# Patient Record
Sex: Male | Born: 1960 | Race: Black or African American | Hispanic: No | Marital: Single | Smoking: Former smoker
Health system: Southern US, Community
[De-identification: ages and names within clinical notes are randomized; demographics above are authoritative.]

## PROBLEM LIST (undated history)

## (undated) DIAGNOSIS — I639 Cerebral infarction, unspecified: Secondary | ICD-10-CM

## (undated) DIAGNOSIS — J449 Chronic obstructive pulmonary disease, unspecified: Secondary | ICD-10-CM

## (undated) DIAGNOSIS — I509 Heart failure, unspecified: Secondary | ICD-10-CM

## (undated) DIAGNOSIS — K259 Gastric ulcer, unspecified as acute or chronic, without hemorrhage or perforation: Secondary | ICD-10-CM

## (undated) DIAGNOSIS — I10 Essential (primary) hypertension: Secondary | ICD-10-CM

## (undated) DIAGNOSIS — K7460 Unspecified cirrhosis of liver: Secondary | ICD-10-CM

## (undated) DIAGNOSIS — F1010 Alcohol abuse, uncomplicated: Secondary | ICD-10-CM

---

## 2000-12-20 ENCOUNTER — Emergency Department (HOSPITAL_COMMUNITY): Admission: EM | Admit: 2000-12-20 | Discharge: 2000-12-20 | Payer: Self-pay | Admitting: Emergency Medicine

## 2006-05-17 ENCOUNTER — Encounter: Admission: RE | Admit: 2006-05-17 | Discharge: 2006-05-17 | Payer: Self-pay | Admitting: Occupational Medicine

## 2010-10-15 ENCOUNTER — Emergency Department (HOSPITAL_COMMUNITY)
Admission: EM | Admit: 2010-10-15 | Discharge: 2010-10-15 | Disposition: A | Discharge status: Home / Self Care | Payer: Self-pay | Attending: Emergency Medicine | Admitting: Emergency Medicine

## 2010-10-15 DIAGNOSIS — M545 Low back pain, unspecified: Secondary | ICD-10-CM | POA: Insufficient documentation

## 2010-10-15 DIAGNOSIS — G8929 Other chronic pain: Secondary | ICD-10-CM | POA: Insufficient documentation

## 2014-09-21 ENCOUNTER — Ambulatory Visit: Payer: Self-pay | Admitting: Family Medicine

## 2017-10-18 ENCOUNTER — Emergency Department (HOSPITAL_COMMUNITY): Payer: Self-pay

## 2017-10-18 ENCOUNTER — Emergency Department (HOSPITAL_COMMUNITY)
Admission: EM | Admit: 2017-10-18 | Discharge: 2017-10-19 | Disposition: A | Discharge status: Home / Self Care | Payer: Self-pay | Attending: Emergency Medicine | Admitting: Emergency Medicine

## 2017-10-18 ENCOUNTER — Encounter (HOSPITAL_COMMUNITY): Payer: Self-pay | Admitting: Emergency Medicine

## 2017-10-18 ENCOUNTER — Other Ambulatory Visit: Payer: Self-pay

## 2017-10-18 DIAGNOSIS — I509 Heart failure, unspecified: Secondary | ICD-10-CM | POA: Insufficient documentation

## 2017-10-18 DIAGNOSIS — R51 Headache: Secondary | ICD-10-CM | POA: Insufficient documentation

## 2017-10-18 DIAGNOSIS — R519 Headache, unspecified: Secondary | ICD-10-CM

## 2017-10-18 DIAGNOSIS — I10 Essential (primary) hypertension: Secondary | ICD-10-CM | POA: Insufficient documentation

## 2017-10-18 DIAGNOSIS — I110 Hypertensive heart disease with heart failure: Secondary | ICD-10-CM | POA: Insufficient documentation

## 2017-10-18 DIAGNOSIS — F17210 Nicotine dependence, cigarettes, uncomplicated: Secondary | ICD-10-CM | POA: Insufficient documentation

## 2017-10-18 HISTORY — DX: Essential (primary) hypertension: I10

## 2017-10-18 HISTORY — DX: Heart failure, unspecified: I50.9

## 2017-10-18 LAB — BASIC METABOLIC PANEL
Anion gap: 9 (ref 5–15)
BUN: 8 mg/dL (ref 6–20)
CHLORIDE: 103 mmol/L (ref 101–111)
CO2: 25 mmol/L (ref 22–32)
CREATININE: 0.93 mg/dL (ref 0.61–1.24)
Calcium: 8.6 mg/dL — ABNORMAL LOW (ref 8.9–10.3)
Glucose, Bld: 121 mg/dL — ABNORMAL HIGH (ref 65–99)
POTASSIUM: 3.7 mmol/L (ref 3.5–5.1)
SODIUM: 137 mmol/L (ref 135–145)

## 2017-10-18 LAB — CBC
HCT: 39.4 % (ref 39.0–52.0)
Hemoglobin: 13.5 g/dL (ref 13.0–17.0)
MCH: 34.4 pg — ABNORMAL HIGH (ref 26.0–34.0)
MCHC: 34.3 g/dL (ref 30.0–36.0)
MCV: 100.3 fL — AB (ref 78.0–100.0)
PLATELETS: 103 10*3/uL — AB (ref 150–400)
RBC: 3.93 MIL/uL — ABNORMAL LOW (ref 4.22–5.81)
RDW: 14.5 % (ref 11.5–15.5)
WBC: 6.3 10*3/uL (ref 4.0–10.5)

## 2017-10-18 LAB — URINALYSIS, ROUTINE W REFLEX MICROSCOPIC
GLUCOSE, UA: NEGATIVE mg/dL
HGB URINE DIPSTICK: NEGATIVE
KETONES UR: NEGATIVE mg/dL
NITRITE: NEGATIVE
PH: 7 (ref 5.0–8.0)
Protein, ur: NEGATIVE mg/dL
SPECIFIC GRAVITY, URINE: 1.025 (ref 1.005–1.030)

## 2017-10-18 MED ORDER — ACETAMINOPHEN 500 MG PO TABS
1000.0000 mg | ORAL_TABLET | Freq: Once | ORAL | Status: AC
  Administered 2017-10-18: 1000 mg via ORAL
  Filled 2017-10-18: qty 2

## 2017-10-18 MED ORDER — HYDROCHLOROTHIAZIDE 25 MG PO TABS
25.0000 mg | ORAL_TABLET | Freq: Once | ORAL | Status: AC
  Administered 2017-10-18: 25 mg via ORAL
  Filled 2017-10-18: qty 1

## 2017-10-18 MED ORDER — IBUPROFEN 800 MG PO TABS
800.0000 mg | ORAL_TABLET | Freq: Once | ORAL | Status: AC
  Administered 2017-10-18: 800 mg via ORAL
  Filled 2017-10-18: qty 1

## 2017-10-18 MED ORDER — LISINOPRIL 10 MG PO TABS
10.0000 mg | ORAL_TABLET | Freq: Once | ORAL | Status: AC
  Administered 2017-10-18: 10 mg via ORAL
  Filled 2017-10-18: qty 1

## 2017-10-18 NOTE — ED Notes (Signed)
Pt AO x 4 on triage no neuro deficit noticed, pt states he has a hx of HTN not taking his medication now.

## 2017-10-18 NOTE — ED Provider Notes (Signed)
[facility] EMERGENCY DEPARTMENT Provider Note   CSN: [id number] Arrival date & time: 10/18/17  2058     History   Chief Complaint Chief Complaint  Patient presents with  . Headache    HPI Jacob Bass is a 57 y.o. male.  Patient presents to the ER for evaluation of headache.  Patient reports that he has been having more frequent headaches over the last 3 weeks.  He does normally have headaches commonly, but the frequency has changed.  He is experiencing mostly right-sided headaches.  These occur sporadically and intermittently.  He has not identified anything that causes the headaches.  They go away on their own without intervention.  He has been experiencing pain in his right leg from the hip down and feels like his legs are weak in general, but equally weak, no unilateral weakness.  He denies vision change, shortness of breath, palpitations, syncope, chest pain.      Past Medical History:  Diagnosis Date  . CHF (congestive heart failure) (HCC)   . Hypertension     There are no active problems to display for this patient.   History reviewed. No pertinent surgical history.     Home Medications    Prior to Admission medications   Medication Sig Start Date End Date Taking? Authorizing Provider  hydrochlorothiazide (HYDRODIURIL) 25 MG tablet Take 1 tablet (25 mg total) by mouth daily. 10/19/17   Gilda Crease, MD  lisinopril (PRINIVIL,ZESTRIL) 10 MG tablet Take 1 tablet (10 mg total) by mouth daily. 10/19/17   Gilda Crease, MD    Family History History reviewed. No pertinent family history.  Social History Social History   Tobacco Use  . Smoking status: Current Every Day Smoker    Packs/day: 2.00    Types: Cigarettes  . Smokeless tobacco: Never Used  Substance Use Topics  . Alcohol use: No    Frequency: Never  . Drug use: No     Allergies   Patient has no known allergies.   Review of Systems Review of Systems    Respiratory: Negative for shortness of breath.   Cardiovascular: Negative for chest pain.  Neurological: Positive for headaches. Negative for syncope.  All other systems reviewed and are negative.    Physical Exam Updated Vital Signs BP (!) 175/95   Pulse 62   Temp 98.6 F (37 C) (Oral)   Resp 16   Ht 5\' 3"  (1.6 m)   Wt 70.8 kg (156 lb)   SpO2 99%   BMI 27.63 kg/m   Physical Exam  Constitutional: He is oriented to person, place, and time. He appears well-developed and well-nourished. No distress.  HENT:  Head: Normocephalic and atraumatic.  Right Ear: Hearing normal.  Left Ear: Hearing normal.  Nose: Nose normal.  Mouth/Throat: Oropharynx is clear and moist and mucous membranes are normal.  Eyes: Conjunctivae and EOM are normal. Pupils are equal, round, and reactive to light.  Neck: Normal range of motion. Neck supple.  Cardiovascular: Regular rhythm, S1 normal and S2 normal. Exam reveals no gallop and no friction rub.  No murmur heard. Pulmonary/Chest: Effort normal and breath sounds normal. No respiratory distress. He exhibits no tenderness.  Abdominal: Soft. Normal appearance and bowel sounds are normal. There is no hepatosplenomegaly. There is no tenderness. There is no rebound, no guarding, no tenderness at McBurney's point and negative Murphy's sign. No hernia.  Musculoskeletal: Normal range of motion.  Neurological: He is alert and oriented to person, place, and  time. He has normal strength. No cranial nerve deficit or sensory deficit. Coordination normal. GCS eye subscore is 4. GCS verbal subscore is 5. GCS motor subscore is 6.  Extraocular muscle movement: normal No visual field cut Pupils: equal and reactive both direct and consensual response is normal No nystagmus present    Sensory function is intact to light touch, pinprick Proprioception intact  Grip strength 5/5 symmetric in upper extremities No pronator drift Normal finger to nose bilaterally  Lower  extremity strength 5/5 against gravity (although some painful inhibition raising the right leg at the hip) Normal heel to shin bilaterally    Skin: Skin is warm, dry and intact. No rash noted. No cyanosis.  Psychiatric: He has a normal mood and affect. His speech is normal and behavior is normal. Thought content normal.  Nursing note and vitals reviewed.    ED Treatments / Results  Labs (all labs ordered are listed, but only abnormal results are displayed) Labs Reviewed  BASIC METABOLIC PANEL - Abnormal; Notable for the following components:      Result Value   Glucose, Bld 121 (*)    Calcium 8.6 (*)    All other components within normal limits  CBC - Abnormal; Notable for the following components:   RBC 3.93 (*)    MCV 100.3 (*)    MCH 34.4 (*)    Platelets 103 (*)    All other components within normal limits  URINALYSIS, ROUTINE W REFLEX MICROSCOPIC - Abnormal; Notable for the following components:   Color, Urine AMBER (*)    Bilirubin Urine SMALL (*)    Leukocytes, UA MODERATE (*)    Bacteria, UA RARE (*)    Squamous Epithelial / LPF 0-5 (*)    All other components within normal limits    EKG  EKG Interpretation  Date/Time:  Friday October 18 2017 21:15:46 EST Ventricular Rate:  81 PR Interval:  104 QRS Duration: 86 QT Interval:  410 QTC Calculation: 476 R Axis:   26 Text Interpretation:  Sinus rhythm with short PR ST & T wave abnormality, consider inferolateral ischemia Prolonged QT Abnormal ECG No previous tracing Confirmed by Gilda CreasePollina, Christopher J [phone]([id number]) on 10/18/2017 11:14:41 PM       Radiology Ct Head Wo Contrast  Result Date: 10/18/2017 CLINICAL DATA:  Numbness to the lower extremities for 2 weeks. Headache. EXAM: CT HEAD WITHOUT CONTRAST TECHNIQUE: Contiguous axial images were obtained from the base of the skull through the vertex without intravenous contrast. COMPARISON:  None. FINDINGS: Brain: There is no evidence for acute hemorrhage, hydrocephalus,  mass lesion, or abnormal extra-axial fluid collection. No definite CT evidence for acute infarction. Diffuse loss of parenchymal volume is consistent with atrophy. Patchy low attenuation in the deep hemispheric and periventricular white matter is nonspecific, but likely reflects chronic microvascular ischemic demyelination. Vascular: Atherosclerotic calcification of the carotid siphons noted at the skull base. No dense MCA sign. Skull: No evidence for fracture. No worrisome lytic or sclerotic lesion. Sinuses/Orbits: The visualized paranasal sinuses and mastoid air cells are clear. Visualized portions of the globes and intraorbital fat are unremarkable. Other: None. IMPRESSION: 1. No acute intracranial abnormality. 2. Atrophy with chronic small vessel white matter ischemic disease. Electronically Signed   By: Kennith CenterEric  Mansell M.D.   On: 10/18/2017 21:43    Procedures Procedures (including critical care time)  Medications Ordered in ED Medications  ibuprofen (ADVIL,MOTRIN) tablet 800 mg (800 mg Oral Given 10/18/17 2342)  acetaminophen (TYLENOL) tablet 1,000 mg (1,000 mg  Oral Given 10/18/17 2342)  lisinopril (PRINIVIL,ZESTRIL) tablet 10 mg (10 mg Oral Given 10/18/17 2342)  hydrochlorothiazide (HYDRODIURIL) tablet 25 mg (25 mg Oral Given 10/18/17 2342)     Initial Impression / Assessment and Plan / ED Course  I have reviewed the triage vital signs and the nursing notes.  Pertinent labs & imaging results that were available during my care of the patient were reviewed by me and considered in my medical decision making (see chart for details).     Patient presents to the emergency department for evaluation of headache.  He reports that over the last several weeks he has been having intermittent headaches at a higher frequency rate in his baseline headaches.  For the last 2 days he has had increased headaches and also has been experiencing weakness in both of his legs with pain in the legs, primarily the  right leg.  He has some pain with movement of the right hip, but no radiculopathy.  There is no neurologic deficit noted on examination.  Patient's blood work is unremarkable.  He had a head CT that did not show any acute abnormality.  Patient is noted to be hypertensive here.  He has a history of hypertension, but reports that he has been off of his meds for nearly a year.  He has no idea what medications he has been on in the past.  Will initiate lisinopril and hydrochlorothiazide.  Patient will require primary care follow-up.  Final Clinical Impressions(s) / ED Diagnoses   Final diagnoses:  Bad headache  Essential hypertension    ED Discharge Orders        Ordered    lisinopril (PRINIVIL,ZESTRIL) 10 MG tablet  Daily     10/19/17 0041    hydrochlorothiazide (HYDRODIURIL) 25 MG tablet  Daily     10/19/17 0041       Gilda Crease, MD 10/19/17 [phone]

## 2017-10-18 NOTE — ED Triage Notes (Signed)
Pt c/o 9/10 HA and bilateral leg pain for the past 2 days getting worse today, no fever, chills, nausea or vomiting.

## 2017-10-18 NOTE — ED Notes (Signed)
Patient transported to CT 

## 2017-10-19 MED ORDER — HYDROCHLOROTHIAZIDE 25 MG PO TABS: 25.0000 mg | ORAL_TABLET | Freq: Every day | ORAL | 3 refills | Status: DC

## 2017-10-19 MED ORDER — LISINOPRIL 10 MG PO TABS: 10.0000 mg | ORAL_TABLET | Freq: Every day | ORAL | 3 refills | Status: DC

## 2018-10-09 ENCOUNTER — Emergency Department (HOSPITAL_COMMUNITY): Payer: Medicaid Other

## 2018-10-09 ENCOUNTER — Other Ambulatory Visit: Payer: Self-pay

## 2018-10-09 ENCOUNTER — Encounter (HOSPITAL_COMMUNITY): Payer: Self-pay | Admitting: Emergency Medicine

## 2018-10-09 ENCOUNTER — Inpatient Hospital Stay (HOSPITAL_COMMUNITY)
Admission: EM | Admit: 2018-10-09 | Discharge: 2018-10-27 | DRG: 811 | Disposition: A | Discharge status: Skilled Nursing Facility | Payer: Medicaid Other | Attending: Internal Medicine | Admitting: Internal Medicine

## 2018-10-09 DIAGNOSIS — I110 Hypertensive heart disease with heart failure: Secondary | ICD-10-CM | POA: Diagnosis present

## 2018-10-09 DIAGNOSIS — G9341 Metabolic encephalopathy: Secondary | ICD-10-CM | POA: Diagnosis present

## 2018-10-09 DIAGNOSIS — K449 Diaphragmatic hernia without obstruction or gangrene: Secondary | ICD-10-CM | POA: Diagnosis present

## 2018-10-09 DIAGNOSIS — D539 Nutritional anemia, unspecified: Principal | ICD-10-CM | POA: Diagnosis present

## 2018-10-09 DIAGNOSIS — F1020 Alcohol dependence, uncomplicated: Secondary | ICD-10-CM | POA: Diagnosis present

## 2018-10-09 DIAGNOSIS — R030 Elevated blood-pressure reading, without diagnosis of hypertension: Secondary | ICD-10-CM | POA: Diagnosis present

## 2018-10-09 DIAGNOSIS — I619 Nontraumatic intracerebral hemorrhage, unspecified: Secondary | ICD-10-CM

## 2018-10-09 DIAGNOSIS — E876 Hypokalemia: Secondary | ICD-10-CM

## 2018-10-09 DIAGNOSIS — R079 Chest pain, unspecified: Secondary | ICD-10-CM | POA: Diagnosis present

## 2018-10-09 DIAGNOSIS — R9431 Abnormal electrocardiogram [ECG] [EKG]: Secondary | ICD-10-CM | POA: Diagnosis present

## 2018-10-09 DIAGNOSIS — R531 Weakness: Secondary | ICD-10-CM

## 2018-10-09 DIAGNOSIS — R29715 NIHSS score 15: Secondary | ICD-10-CM | POA: Diagnosis not present

## 2018-10-09 DIAGNOSIS — B1920 Unspecified viral hepatitis C without hepatic coma: Secondary | ICD-10-CM | POA: Diagnosis present

## 2018-10-09 DIAGNOSIS — D7589 Other specified diseases of blood and blood-forming organs: Secondary | ICD-10-CM | POA: Diagnosis present

## 2018-10-09 DIAGNOSIS — I6340 Cerebral infarction due to embolism of unspecified cerebral artery: Secondary | ICD-10-CM

## 2018-10-09 DIAGNOSIS — Z5900 Homelessness unspecified: Secondary | ICD-10-CM

## 2018-10-09 DIAGNOSIS — R29709 NIHSS score 9: Secondary | ICD-10-CM | POA: Diagnosis not present

## 2018-10-09 DIAGNOSIS — I248 Other forms of acute ischemic heart disease: Secondary | ICD-10-CM | POA: Diagnosis present

## 2018-10-09 DIAGNOSIS — K7030 Alcoholic cirrhosis of liver without ascites: Secondary | ICD-10-CM | POA: Diagnosis present

## 2018-10-09 DIAGNOSIS — I739 Peripheral vascular disease, unspecified: Secondary | ICD-10-CM | POA: Diagnosis present

## 2018-10-09 DIAGNOSIS — G8194 Hemiplegia, unspecified affecting left nondominant side: Secondary | ICD-10-CM | POA: Diagnosis not present

## 2018-10-09 DIAGNOSIS — K7460 Unspecified cirrhosis of liver: Secondary | ICD-10-CM

## 2018-10-09 DIAGNOSIS — D6959 Other secondary thrombocytopenia: Secondary | ICD-10-CM | POA: Diagnosis present

## 2018-10-09 DIAGNOSIS — R29713 NIHSS score 13: Secondary | ICD-10-CM | POA: Diagnosis not present

## 2018-10-09 DIAGNOSIS — K254 Chronic or unspecified gastric ulcer with hemorrhage: Secondary | ICD-10-CM | POA: Diagnosis present

## 2018-10-09 DIAGNOSIS — Z79899 Other long term (current) drug therapy: Secondary | ICD-10-CM

## 2018-10-09 DIAGNOSIS — W010XXA Fall on same level from slipping, tripping and stumbling without subsequent striking against object, initial encounter: Secondary | ICD-10-CM | POA: Diagnosis present

## 2018-10-09 DIAGNOSIS — I471 Supraventricular tachycardia: Secondary | ICD-10-CM | POA: Diagnosis not present

## 2018-10-09 DIAGNOSIS — D689 Coagulation defect, unspecified: Secondary | ICD-10-CM | POA: Diagnosis present

## 2018-10-09 DIAGNOSIS — I509 Heart failure, unspecified: Secondary | ICD-10-CM | POA: Diagnosis present

## 2018-10-09 DIAGNOSIS — R778 Other specified abnormalities of plasma proteins: Secondary | ICD-10-CM

## 2018-10-09 DIAGNOSIS — J690 Pneumonitis due to inhalation of food and vomit: Secondary | ICD-10-CM

## 2018-10-09 DIAGNOSIS — E162 Hypoglycemia, unspecified: Secondary | ICD-10-CM | POA: Diagnosis present

## 2018-10-09 DIAGNOSIS — R05 Cough: Secondary | ICD-10-CM

## 2018-10-09 DIAGNOSIS — K259 Gastric ulcer, unspecified as acute or chronic, without hemorrhage or perforation: Secondary | ICD-10-CM

## 2018-10-09 DIAGNOSIS — R195 Other fecal abnormalities: Secondary | ICD-10-CM

## 2018-10-09 DIAGNOSIS — R7989 Other specified abnormal findings of blood chemistry: Secondary | ICD-10-CM

## 2018-10-09 DIAGNOSIS — J449 Chronic obstructive pulmonary disease, unspecified: Secondary | ICD-10-CM

## 2018-10-09 DIAGNOSIS — R29810 Facial weakness: Secondary | ICD-10-CM | POA: Diagnosis present

## 2018-10-09 DIAGNOSIS — I639 Cerebral infarction, unspecified: Secondary | ICD-10-CM | POA: Diagnosis not present

## 2018-10-09 DIAGNOSIS — R4781 Slurred speech: Secondary | ICD-10-CM | POA: Diagnosis not present

## 2018-10-09 DIAGNOSIS — I6330 Cerebral infarction due to thrombosis of unspecified cerebral artery: Secondary | ICD-10-CM

## 2018-10-09 DIAGNOSIS — F17210 Nicotine dependence, cigarettes, uncomplicated: Secondary | ICD-10-CM | POA: Diagnosis present

## 2018-10-09 DIAGNOSIS — Y9289 Other specified places as the place of occurrence of the external cause: Secondary | ICD-10-CM

## 2018-10-09 DIAGNOSIS — R29714 NIHSS score 14: Secondary | ICD-10-CM | POA: Diagnosis not present

## 2018-10-09 DIAGNOSIS — K8070 Calculus of gallbladder and bile duct without cholecystitis without obstruction: Secondary | ICD-10-CM | POA: Diagnosis present

## 2018-10-09 DIAGNOSIS — R32 Unspecified urinary incontinence: Secondary | ICD-10-CM | POA: Diagnosis present

## 2018-10-09 DIAGNOSIS — E861 Hypovolemia: Secondary | ICD-10-CM | POA: Diagnosis present

## 2018-10-09 DIAGNOSIS — D509 Iron deficiency anemia, unspecified: Secondary | ICD-10-CM | POA: Diagnosis present

## 2018-10-09 DIAGNOSIS — I10 Essential (primary) hypertension: Secondary | ICD-10-CM | POA: Diagnosis present

## 2018-10-09 DIAGNOSIS — I959 Hypotension, unspecified: Secondary | ICD-10-CM | POA: Diagnosis not present

## 2018-10-09 DIAGNOSIS — D696 Thrombocytopenia, unspecified: Secondary | ICD-10-CM | POA: Diagnosis present

## 2018-10-09 DIAGNOSIS — R509 Fever, unspecified: Secondary | ICD-10-CM | POA: Diagnosis not present

## 2018-10-09 DIAGNOSIS — E538 Deficiency of other specified B group vitamins: Secondary | ICD-10-CM | POA: Diagnosis present

## 2018-10-09 DIAGNOSIS — E870 Hyperosmolality and hypernatremia: Secondary | ICD-10-CM | POA: Diagnosis present

## 2018-10-09 DIAGNOSIS — B370 Candidal stomatitis: Secondary | ICD-10-CM | POA: Diagnosis not present

## 2018-10-09 DIAGNOSIS — R0789 Other chest pain: Secondary | ICD-10-CM

## 2018-10-09 DIAGNOSIS — I609 Nontraumatic subarachnoid hemorrhage, unspecified: Secondary | ICD-10-CM

## 2018-10-09 DIAGNOSIS — R059 Cough, unspecified: Secondary | ICD-10-CM

## 2018-10-09 DIAGNOSIS — W19XXXA Unspecified fall, initial encounter: Secondary | ICD-10-CM

## 2018-10-09 DIAGNOSIS — R1310 Dysphagia, unspecified: Secondary | ICD-10-CM

## 2018-10-09 LAB — COMPREHENSIVE METABOLIC PANEL
ALT: 76 U/L — AB (ref 0–44)
AST: 133 U/L — AB (ref 15–41)
Albumin: 2.5 g/dL — ABNORMAL LOW (ref 3.5–5.0)
Alkaline Phosphatase: 76 U/L (ref 38–126)
Anion gap: 15 (ref 5–15)
BILIRUBIN TOTAL: 2 mg/dL — AB (ref 0.3–1.2)
BUN: 34 mg/dL — AB (ref 6–20)
CO2: 23 mmol/L (ref 22–32)
Calcium: 8.3 mg/dL — ABNORMAL LOW (ref 8.9–10.3)
Chloride: 101 mmol/L (ref 98–111)
Creatinine, Ser: 0.99 mg/dL (ref 0.61–1.24)
GFR calc Af Amer: >60 mL/min (ref >60)
GLUCOSE: 96 mg/dL (ref 70–99)
POTASSIUM: 4.3 mmol/L (ref 3.5–5.1)
Sodium: 139 mmol/L (ref 135–145)
TOTAL PROTEIN: 6.7 g/dL (ref 6.5–8.1)

## 2018-10-09 LAB — CBC WITH DIFFERENTIAL/PLATELET
ABS IMMATURE GRANULOCYTES: 0.06 10*3/uL (ref 0.00–0.07)
BASOS PCT: 0 %
Basophils Absolute: 0 10*3/uL (ref 0.0–0.1)
Eosinophils Absolute: 0 10*3/uL (ref 0.0–0.5)
Eosinophils Relative: 0 %
HEMATOCRIT: 31.1 % — AB (ref 39.0–52.0)
Hemoglobin: 10.3 g/dL — ABNORMAL LOW (ref 13.0–17.0)
IMMATURE GRANULOCYTES: 1 %
Lymphocytes Relative: 15 %
Lymphs Abs: 1.5 10*3/uL (ref 0.7–4.0)
MCH: 33.3 pg (ref 26.0–34.0)
MCHC: 33.1 g/dL (ref 30.0–36.0)
MCV: 100.6 fL — AB (ref 80.0–100.0)
MONO ABS: 1.5 10*3/uL — AB (ref 0.1–1.0)
MONOS PCT: 16 %
NEUTROS ABS: 6.6 10*3/uL (ref 1.7–7.7)
NEUTROS PCT: 68 %
PLATELETS: 100 10*3/uL — AB (ref 150–400)
RBC: 3.09 MIL/uL — ABNORMAL LOW (ref 4.22–5.81)
RDW: 13.2 % (ref 11.5–15.5)
WBC: 9.6 10*3/uL (ref 4.0–10.5)
nRBC: 0 % (ref 0.0–0.2)

## 2018-10-09 LAB — URINALYSIS, ROUTINE W REFLEX MICROSCOPIC
BACTERIA UA: NONE SEEN
Bilirubin Urine: NEGATIVE
Glucose, UA: NEGATIVE mg/dL
KETONES UR: 5 mg/dL — AB
LEUKOCYTES UA: NEGATIVE
Nitrite: NEGATIVE
PH: 5 (ref 5.0–8.0)
Protein, ur: NEGATIVE mg/dL
Specific Gravity, Urine: 1.024 (ref 1.005–1.030)

## 2018-10-09 LAB — RAPID URINE DRUG SCREEN, HOSP PERFORMED
AMPHETAMINES: NOT DETECTED
BARBITURATES: NOT DETECTED
BENZODIAZEPINES: NOT DETECTED
Cocaine: NOT DETECTED
Opiates: NOT DETECTED
Tetrahydrocannabinol: NOT DETECTED

## 2018-10-09 LAB — MAGNESIUM: MAGNESIUM: 1.9 mg/dL (ref 1.7–2.4)

## 2018-10-09 LAB — TROPONIN I
TROPONIN I: 0.04 ng/mL — AB (ref <0.03)
Troponin I: 0.04 ng/mL (ref <0.03)
Troponin I: 0.05 ng/mL (ref <0.03)

## 2018-10-09 LAB — LIPASE, BLOOD: Lipase: 30 U/L (ref 11–51)

## 2018-10-09 MED ORDER — SODIUM CHLORIDE 0.9 % IV BOLUS
1000.0000 mL | Freq: Once | INTRAVENOUS | Status: AC
  Administered 2018-10-09: 1000 mL via INTRAVENOUS

## 2018-10-09 MED ORDER — HEPARIN (PORCINE) 25000 UT/250ML-% IV SOLN
1050.0000 [IU]/h | INTRAVENOUS | Status: DC
  Administered 2018-10-09: 900 [IU]/h via INTRAVENOUS
  Filled 2018-10-09: qty 250

## 2018-10-09 MED ORDER — ALUM & MAG HYDROXIDE-SIMETH 200-200-20 MG/5ML PO SUSP
30.0000 mL | Freq: Once | ORAL | Status: AC
  Administered 2018-10-10: 30 mL via ORAL
  Filled 2018-10-09: qty 30

## 2018-10-09 MED ORDER — METOPROLOL TARTRATE 25 MG PO TABS
25.0000 mg | ORAL_TABLET | Freq: Two times a day (BID) | ORAL | Status: DC
  Administered 2018-10-10: 25 mg via ORAL
  Filled 2018-10-09: qty 1

## 2018-10-09 MED ORDER — ONDANSETRON HCL 4 MG/2ML IJ SOLN: 4.0000 mg | Freq: Four times a day (QID) | INTRAMUSCULAR | Status: DC | PRN

## 2018-10-09 MED ORDER — HEPARIN BOLUS VIA INFUSION
4000.0000 [IU] | Freq: Once | INTRAVENOUS | Status: AC
  Administered 2018-10-09: 4000 [IU] via INTRAVENOUS
  Filled 2018-10-09: qty 4000

## 2018-10-09 MED ORDER — ACETAMINOPHEN 325 MG PO TABS
650.0000 mg | ORAL_TABLET | ORAL | Status: DC | PRN
  Administered 2018-10-10 – 2018-10-26 (×4): 650 mg via ORAL
  Filled 2018-10-09 (×4): qty 2

## 2018-10-09 MED ORDER — MORPHINE SULFATE (PF) 2 MG/ML IV SOLN: 2.0000 mg | INTRAVENOUS | Status: DC | PRN

## 2018-10-09 MED ORDER — LISINOPRIL 10 MG PO TABS
10.0000 mg | ORAL_TABLET | Freq: Every day | ORAL | Status: DC
  Administered 2018-10-10 – 2018-10-15 (×4): 10 mg via ORAL
  Filled 2018-10-09 (×6): qty 1

## 2018-10-09 NOTE — Progress Notes (Signed)
ANTICOAGULATION CONSULT NOTE - Initial Consult  Pharmacy Consult for heparin Indication: chest pain/ACS  No Known Allergies  Patient Measurements: Height: 5\' 3"  (160 cm) Weight: 181 lb (82.1 kg) IBW/kg (Calculated) : 56.9 Heparin Dosing Weight: 74.4 kg  Vital Signs: Temp: 98.6 F (37 C) (02/06 2000) Temp Source: Oral (02/06 2000) BP: 134/69 (02/06 2000) Pulse Rate: 89 (02/06 2000)  Labs: Recent Labs    10/09/18 1315 10/09/18 1725 10/09/18 1947  HGB 10.3*  --   --   HCT 31.1*  --   --   PLT 100*  --   --   CREATININE 0.99  --   --   TROPONINI 0.04* 0.04* 0.05*    Estimated Creatinine Clearance: 76.1 mL/min (by C-G formula based on SCr of 0.99 mg/dL).   Medical History: Past Medical History:  Diagnosis Date  . CHF (congestive heart failure) (HCC)   . Hypertension     Assessment: 57 yom s/p mechanical fall, found to have abnormal EKG and elevated troponin (0.04>0.05). No AC PTA.  Hgb 10.3, plt 100. No s/sx of bleeding.   Goal of Therapy:  Heparin level 0.3-0.7 units/ml Monitor platelets by anticoagulation protocol: Yes   Plan:  Give 4000 units bolus x 1 Start heparin infusion at 900 units/hr Check anti-Xa level in 6 hours and daily while on heparin Continue to monitor H&H and platelets  Sherron Monday, PharmD, BCCCP Clinical Pharmacist  Pager: [phone] Phone: [phone] 10/09/2018,10:37 PM
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
## 2018-10-09 NOTE — ED Notes (Signed)
Attempted to call report; nurse unable to take at this time but will call me back. 

## 2018-10-09 NOTE — ED Notes (Signed)
Pt states that he gets the shakes when he does not drink-- drinks 1 case of beer a day
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
## 2018-10-09 NOTE — ED Provider Notes (Signed)
[facility] EMERGENCY DEPARTMENT Provider Note   CSN: [id number] Arrival date & time: 10/09/18  1243     History   Chief Complaint No chief complaint on file.   HPI Jacob Bass is a 58 y.o. male who presents for fall. Patient is homeless.  He has a history of chronic alcohol abuse.  Patient states that he does not get the shakes or seizures when he does not drink "as long as he gets the next drink."  Patient states he drinks about a case of beer daily.  He denies any other known medical problems however he states that he has not seen a real doctor since 1991.  EMR lists his chronic medical conditions as CHF and hypertension..  patient usually uses a wheelchair or cane to ambulate.  Today he states that his legs got weak and he fell.  Patient became incontinent of stool.  He has not had any nausea vomiting or diarrhea.  He denies any active chest pain however EMS was concerned about EKG that they felt had some ST segment depression.  Patient denies fevers chills history of DVT or PE.  He did not have syncope.  He denies hitting his head or losing consciousness.  Patient states that he was drinking this morning however he does not appear clinically intoxicated   HPI  Past Medical History:  Diagnosis Date  . CHF (congestive heart failure) (HCC)   . Hypertension     There are no active problems to display for this patient.   No past surgical history on file.      Home Medications    Prior to Admission medications   Medication Sig Start Date End Date Taking? Authorizing Provider  hydrochlorothiazide (HYDRODIURIL) 25 MG tablet Take 1 tablet (25 mg total) by mouth daily. 10/19/17   Gilda Crease, MD  lisinopril (PRINIVIL,ZESTRIL) 10 MG tablet Take 1 tablet (10 mg total) by mouth daily. 10/19/17   Gilda Crease, MD    Family History No family history on file.  Social History Social History   Tobacco Use  . Smoking status: Current Every Day  Smoker    Packs/day: 2.00    Types: Cigarettes  . Smokeless tobacco: Never Used  Substance Use Topics  . Alcohol use: No    Frequency: Never  . Drug use: No     Allergies   Patient has no known allergies.   Review of Systems Review of Systems Ten systems reviewed and are negative for acute change, except as noted in the HPI.    Physical Exam Updated Vital Signs BP (!) 152/67 (BP Location: Right Arm)   Pulse 95   Temp 98.6 F (37 C)   Resp 17   SpO2 100%   Physical Exam Vitals signs and nursing note reviewed.  Constitutional:      General: He is not in acute distress.    Appearance: He is well-developed. He is not diaphoretic.  HENT:     Head: Normocephalic and atraumatic.  Eyes:     General: No scleral icterus.    Conjunctiva/sclera: Conjunctivae normal.  Neck:     Musculoskeletal: Normal range of motion and neck supple.  Cardiovascular:     Rate and Rhythm: Normal rate and regular rhythm.     Heart sounds: Normal heart sounds.  Pulmonary:     Effort: Pulmonary effort is normal. No respiratory distress.     Breath sounds: Normal breath sounds.  Abdominal:     Palpations: Abdomen  is soft.     Tenderness: There is no abdominal tenderness.  Musculoskeletal:     Right lower leg: Edema present.     Left lower leg: Edema present.     Comments: Non pitting BL LE Edema No midline spinal tenderness or pelvic instability  Skin:    General: Skin is warm and dry.  Neurological:     Mental Status: He is alert.  Psychiatric:        Behavior: Behavior normal.      ED Treatments / Results  Labs (all labs ordered are listed, but only abnormal results are displayed) Labs Reviewed  COMPREHENSIVE METABOLIC PANEL  CBC WITH DIFFERENTIAL/PLATELET  TROPONIN I  LIPASE, BLOOD  URINALYSIS, ROUTINE W REFLEX MICROSCOPIC    EKG None  Radiology No results found.  Procedures Procedures (including critical care time)  Medications Ordered in ED Medications  sodium  chloride 0.9 % bolus 1,000 mL (has no administration in time range)     Initial Impression / Assessment and Plan / ED Course  I have reviewed the triage vital signs and the nursing notes.  Pertinent labs & imaging results that were available during my care of the patient were reviewed by me and considered in my medical decision making (see chart for details).  Clinical Course as of Oct 09 1450  Thu Oct 09, 2018  1355 MCV(!): 100.6 [AH]  1355 Hemoglobin(!): 10.3 [AH]  1355 Hgb urine dipstick(!): LARGE [AH]  1356 I personally reviewed the patient's EKG. No signs of acute ischemia.    [AH]    Clinical Course User Index [AH] Arthor Captain, PA-C    Patient is seen after fall, history of chronic alcohol abuse.  Lab work-up is pending.  His initial EKG shows no signs of acute ischemia.  I have given sign out to PA Laveda Norman who will assume care of the patient.  Final Clinical Impressions(s) / ED Diagnoses   Final diagnoses:  None    ED Discharge Orders    None       Arthor Captain, PA-C 10/15/18 1256    Charlynne Pander, MD 10/15/18 [phone]

## 2018-10-10 ENCOUNTER — Other Ambulatory Visit: Payer: Self-pay

## 2018-10-10 ENCOUNTER — Inpatient Hospital Stay (HOSPITAL_BASED_OUTPATIENT_CLINIC_OR_DEPARTMENT_OTHER): Payer: Medicaid Other

## 2018-10-10 DIAGNOSIS — Z720 Tobacco use: Secondary | ICD-10-CM

## 2018-10-10 DIAGNOSIS — R072 Precordial pain: Secondary | ICD-10-CM

## 2018-10-10 DIAGNOSIS — R030 Elevated blood-pressure reading, without diagnosis of hypertension: Secondary | ICD-10-CM

## 2018-10-10 DIAGNOSIS — W19XXXA Unspecified fall, initial encounter: Secondary | ICD-10-CM

## 2018-10-10 DIAGNOSIS — R7989 Other specified abnormal findings of blood chemistry: Secondary | ICD-10-CM

## 2018-10-10 DIAGNOSIS — I361 Nonrheumatic tricuspid (valve) insufficiency: Secondary | ICD-10-CM

## 2018-10-10 LAB — CBC
HCT: 23 % — ABNORMAL LOW (ref 39.0–52.0)
HCT: 24 % — ABNORMAL LOW (ref 39.0–52.0)
HCT: 28.4 % — ABNORMAL LOW (ref 39.0–52.0)
Hemoglobin: 7.8 g/dL — ABNORMAL LOW (ref 13.0–17.0)
Hemoglobin: 8 g/dL — ABNORMAL LOW (ref 13.0–17.0)
Hemoglobin: 9.7 g/dL — ABNORMAL LOW (ref 13.0–17.0)
MCH: 33.3 pg (ref 26.0–34.0)
MCH: 33.1 pg (ref 26.0–34.0)
MCH: 34.2 pg — ABNORMAL HIGH (ref 26.0–34.0)
MCHC: 33.9 g/dL (ref 30.0–36.0)
MCHC: 33.3 g/dL (ref 30.0–36.0)
MCHC: 34.2 g/dL (ref 30.0–36.0)
MCV: 98.3 fL (ref 80.0–100.0)
MCV: 99.2 fL (ref 80.0–100.0)
MCV: 100 fL (ref 80.0–100.0)
NRBC: 0 % (ref 0.0–0.2)
Platelets: 83 10*3/uL — ABNORMAL LOW (ref 150–400)
Platelets: 89 10*3/uL — ABNORMAL LOW (ref 150–400)
Platelets: 108 10*3/uL — ABNORMAL LOW (ref 150–400)
RBC: 2.34 MIL/uL — AB (ref 4.22–5.81)
RBC: 2.42 MIL/uL — ABNORMAL LOW (ref 4.22–5.81)
RBC: 2.84 MIL/uL — ABNORMAL LOW (ref 4.22–5.81)
RDW: 13.4 % (ref 11.5–15.5)
RDW: 13.3 % (ref 11.5–15.5)
RDW: 13.4 % (ref 11.5–15.5)
WBC: 7.5 10*3/uL (ref 4.0–10.5)
WBC: 7 10*3/uL (ref 4.0–10.5)
WBC: 7.6 10*3/uL (ref 4.0–10.5)
nRBC: 0 % (ref 0.0–0.2)
nRBC: 0 % (ref 0.0–0.2)

## 2018-10-10 LAB — TYPE AND SCREEN
ABO/RH(D): O POS
Antibody Screen: NEGATIVE

## 2018-10-10 LAB — HIV ANTIBODY (ROUTINE TESTING W REFLEX): HIV Screen 4th Generation wRfx: NONREACTIVE

## 2018-10-10 LAB — LIPID PANEL
Cholesterol: 107 mg/dL (ref 0–200)
HDL: 21 mg/dL — ABNORMAL LOW (ref >40)
LDL CALC: 72 mg/dL (ref 0–99)
Total CHOL/HDL Ratio: 5.1 RATIO
Triglycerides: 69 mg/dL (ref <150)
VLDL: 14 mg/dL (ref 0–40)

## 2018-10-10 LAB — HEPARIN LEVEL (UNFRACTIONATED): Heparin Unfractionated: 0.23 IU/mL — ABNORMAL LOW (ref 0.30–0.70)

## 2018-10-10 LAB — ECHOCARDIOGRAM COMPLETE
Height: 63 in
Weight: 2249.6 oz

## 2018-10-10 LAB — OCCULT BLOOD X 1 CARD TO LAB, STOOL: FECAL OCCULT BLD: POSITIVE — AB

## 2018-10-10 LAB — TROPONIN I
TROPONIN I: 0.04 ng/mL — AB (ref <0.03)
Troponin I: 0.05 ng/mL (ref <0.03)
Troponin I: 0.04 ng/mL (ref <0.03)

## 2018-10-10 LAB — ABO/RH: ABO/RH(D): O POS

## 2018-10-10 LAB — VITAMIN B12: Vitamin B-12: 313 pg/mL (ref 180–914)

## 2018-10-10 MED ORDER — METOPROLOL TARTRATE 25 MG PO TABS
25.0000 mg | ORAL_TABLET | Freq: Two times a day (BID) | ORAL | Status: DC
  Administered 2018-10-10 – 2018-10-12 (×5): 25 mg via ORAL
  Filled 2018-10-10 (×6): qty 1

## 2018-10-10 MED ORDER — SODIUM CHLORIDE 0.9 % IV SOLN: INTRAVENOUS | Status: DC

## 2018-10-10 NOTE — Progress Notes (Addendum)
Progress Note  Patient Name: Jacob Bass Date of Encounter: 10/10/2018  Primary Cardiologist: [facility] (Dr. Jacques NavyAcharya).  Subjective   No significant overnight events. Patient denies any chest pain or shortness of breath. He states he has only ever had one episode of chest pain several months ago while he was drinking beer but upon further question he states it was more abdominal pain not chest pain. He denies any blood in his urine or stools.   Inpatient Medications    Scheduled Meds: . lisinopril  10 mg Oral Daily  . metoprolol tartrate  25 mg Oral BID   Continuous Infusions: . heparin 1,050 Units/hr (10/10/18 [id number])   PRN Meds: acetaminophen, morphine injection, ondansetron (ZOFRAN) IV   Vital Signs    Vitals:   10/09/18 1600 10/09/18 2000 10/09/18 2340 10/10/18 0436  BP: 129/70 134/69 (!) 147/77 (!) 117/59  Pulse: 90 89 86 68  Resp: (!) 26 18 20 20   Temp:  98.6 F (37 C) 98.3 F (36.8 C) 99.6 F (37.6 C)  TempSrc:  Oral Oral Oral  SpO2: 97% 94% 100% 97%  Weight:   64.3 kg 63.8 kg  Height:        Intake/Output Summary (Last 24 hours) at 10/10/2018 0859 Last data filed at 10/10/2018 0448 Gross per 24 hour  Intake 68.02 ml  Output 200 ml  Net -131.98 ml   Last 3 Weights 10/10/2018 10/09/2018 10/09/2018  Weight (lbs) 140 lb 9.6 oz 141 lb 12.8 oz 181 lb  Weight (kg) 63.776 kg 64.32 kg 82.101 kg      Telemetry    Sinus rhythm with heart rates in the 60's to 80's. - Personally Reviewed  ECG    No new ECG tracing today. - Personally Reviewed  Physical Exam   GEN: African-American male resting comfortably in no acute distress.   Neck: Supple. No JVD. Cardiac: RRR. No murmurs, rubs, or gallops.  Respiratory: Diminished breath sounds bilaterally. Minimal crackles noted in bases.  GI: Abdomen soft, non-distended, and non-tender. Bowel sounds present.  MS: Trace to 1+ pitting edema of bilateral lower extremities (left > right). Skin: Warm and dry. Neuro:  No focal  deficits. Psych: Normal affect. Somewhat drowsy this morning.  Labs    Chemistry Recent Labs  Lab 10/09/18 1315  NA 139  K 4.3  CL 101  CO2 23  GLUCOSE 96  BUN 34*  CREATININE 0.99  CALCIUM 8.3*  PROT 6.7  ALBUMIN 2.5*  AST 133*  ALT 76*  ALKPHOS 76  BILITOT 2.0*  GFRNONAA >60  GFRAA >60  ANIONGAP 15     Hematology Recent Labs  Lab 10/09/18 1315 10/10/18 0313  WBC 9.6 7.5  RBC 3.09* 2.34*  HGB 10.3* 7.8*  HCT 31.1* 23.0*  MCV 100.6* 98.3  MCH 33.3 33.3  MCHC 33.1 33.9  RDW 13.2 13.4  PLT 100* 83*    Cardiac Enzymes Recent Labs  Lab 10/09/18 1947 10/09/18 2356 10/10/18 0313 10/10/18 0520  TROPONINI 0.05* 0.05* 0.04* 0.04*   No results for input(s): TROPIPOC in the last 168 hours.   BNPNo results for input(s): BNP, PROBNP in the last 168 hours.   DDimer No results for input(s): DDIMER in the last 168 hours.   Radiology    Dg Chest 2 View  Result Date: 10/09/2018 CLINICAL DATA:  Chest pain, fall today.  History of CHF. EXAM: CHEST - 2 VIEW COMPARISON:  Chest radiograph May 17, 2016 FINDINGS: Cardiac silhouette is upper limits of normal  size. Mild pulmonary vascular congestion without pleural effusion or focal consolidation. Biapical pleural thickening. No pneumothorax. Upper thoracic dextroscoliosis. IMPRESSION: Borderline cardiomegaly and mild pulmonary vascular congestion. Electronically Signed   By: Awilda Metro M.D.   On: 10/09/2018 15:33   Dg Hip Unilat W Or Wo Pelvis 2-3 Views Right  Result Date: 10/09/2018 CLINICAL DATA:  Larey Seat today.  Struck by Librarian, academic in 2007. EXAM: DG HIP (WITH OR WITHOUT PELVIS) 2-3V RIGHT COMPARISON:  None. FINDINGS: Femoral heads are located. No dislocation. Severe RIGHT hip joint space narrowing with subchondral cyst formation, chronic flattening of the humeral head. RIGHT hip heterotopic ossification. Mild LEFT hip joint space narrowing and marginal spurring. No destructive bony lesions. Sacroiliac joints are  symmetric. Mild vascular calcifications. IMPRESSION: 1. No acute fracture deformity or dislocation. 2. 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 3.  Aortic Atherosclerosis (ICD10-I70.0). Electronically Signed   By: Awilda Metro M.D.   On: 10/09/2018 17:44    Cardiac Studies   None.  Patient Profile   Jacob Bass is a 58 y.o. male with a history of hypertension and homelessness who presented to the [facility] ED after suffering a mechanical fall and was found to have an elevated troponin. Cardiology was consulted for the elevated troponin and concern for possible ACS.  Assessment & Plan    Elevated Troponin  - EKG in the ED showed sinus rhythm with LVH and some repolarization changes but no acute ischemic changes compared to prior tracings. - Troponin minimally elevated and flat at 0.04 >> 0.04 >> 0.05 >> 0.05 >> 0.04 >> 0.04. Not consistent with ACS. - LDL 72.  - Patient denies having any recent chest pain to me. He states he has only ever had one episode of chest pain several months ago when drinking beer and upon further questioning he states it was more abdominal pain. - Will check Echo given questionable history of CHF (Patient has CHF listed under his problem list but patient does not recall this and there is not any notes or documentation in the chart to support this). - Will discontinue Heparin at this time. - Likely demand ischemia given hemoglobin of 7.8. Given no cardiac symptoms and acute anemia, do not think there is much benefit in pursuing stress test at this time. Could consider as outpatient pending Echo results.   Acute Normocytic Anemia - Hemoglobin 10.3 on admission. Was 13.5 in 10/2017. - Repeat hemoglobin 7.8.  - Hemoccult has been ordered.  - Management per primary team.   Hypertension  - BP currently well controlled at 117/59.  - Continue current medications.  For questions or updates, please contact [facility] Please consult  [email or web address] for contact info under        Signed, Corrin Parker, PA-C  10/10/2018, 8:59 AM   ---------------------------------------------------------------------------------------------   History and all data above reviewed.  Patient examined.  I agree with the findings as above.  Jacob Bass is a 58 yo gentleman with a history of hypertension and homelessness who presents after a mechanical fall. He is noted to have an acute anemia and noncardiac chest pain. Borderline, flat troponin elevation.   Constitutional: No acute distress Cardiovascular: regular rhythm, normal rate, no murmurs. S1 and S2 normal. Radial pulses normal bilaterally. No jugular venous distention.  Respiratory: clear to auscultation bilaterally GI : normal bowel sounds, soft and nontender. No distention.   MSK: extremities warm, well perfused. No edema.  NEURO: grossly nonfocal exam, moves all extremities.  PSYCH: alert and oriented x 3, normal mood and affect.   All available labs, radiology testing, previous records reviewed. Agree with documented assessment and plan of my colleague as stated above with the following additions or changes:  Principal Problem:   Chest pain Active Problems:   Troponin level elevated   EKG abnormalities   Homelessness   Blood pressure elevated without history of HTN   Anemia, macrocytic   Thrombocytopenia (HCC)    Plan: chest soreness likely does not represent cardiac chest pain. Mild troponin elevation is likely demand related, and unlikely to represent ACS. Recommend evaluation for anemia. Correction of anemia will likely relieve demand mismatch. Echo significant for grade 2 diastolic dysfunction and moderate LA enlargement.   Patient will be referred for close outpatient cardiology follow up and consideration for further cardiovascular risk stratification can be performed as an outpatient.   Time Spent Directly with Patient:  I have spent a total of 25 minutes with  the patient reviewing hospital notes, telemetry, EKGs, labs and examining the patient as well as establishing an assessment and plan that was discussed personally with the patient.  > 50% of time was spent in direct patient care.  Length of Stay:  LOS: 0 days   Jacob PoissonGayatri A Acharya, MD [facility] 6:07 PM  10/10/2018

## 2018-10-10 NOTE — Progress Notes (Signed)
Physical Therapy Evaluation Patient Details Name: Jacob Bass MRN: [id number] DOB: Apr 28, 1961 Today's Date: 10/10/2018   History of Present Illness  Patient is 58 y/o male presenting to hospital following a fall. Patient with abnormal EKG and elevated troponins but likley due to demand ischemia. PMH includes HTN, anemia, questionable CHF, and thrombocytopenia.    Clinical Impression  Patient admitted to hospital secondary to problems above and with deficits below. Patient required min-guard to minA to stand x2, and required min guard to ambulate with use of RW. Patient with noticeable R knee swelling which is likely impacting functional mobility and causing noted gait deviations. Patient with possible cognitive deficits at baseline due to conflicting information reported regarding home equipment/information. Given functional mobility and cognitive deficits, recommending HHPT however unsure if patient will qualify for services. Patient will benefit from acute physical therapy to maximize independence and safety with functional mobility.     Follow Up Recommendations Home health PT;Supervision for mobility/OOB(unsure what pt will qualify for )    Equipment Recommendations  Rolling walker with 5" wheels    Recommendations for Other Services OT consult     Precautions / Restrictions Precautions Precautions: Fall Precaution Comments: Patient reports 2 falls in last month Restrictions Weight Bearing Restrictions: No      Mobility  Bed Mobility Overal bed mobility: Needs Assistance Bed Mobility: Supine to Sit;Sit to Supine     Supine to sit: Supervision Sit to supine: Min assist   General bed mobility comments: Patient required supervision to sit EOB for safety. Required cues for sequencing. Required minA for RLE assit to return to supine due to pain and weakness   Transfers Overall transfer level: Needs assistance Equipment used: Rolling walker (2 wheeled) Transfers: Sit to/from  Stand Sit to Stand: Min guard;Min assist         General transfer comment: Patient required min guard-minA to stand with RW for safety and lift assist x2.  Verbal cues for hand placement prior to standing.    Ambulation/Gait Ambulation/Gait assistance: Min guard Gait Distance (Feet): 100 Feet Assistive device: Rolling walker (2 wheeled) Gait Pattern/deviations: Step-to pattern;Decreased step length - left;Decreased stance time - right;Decreased weight shift to right;Shuffle;Antalgic;Drifts right/left Gait velocity: decreased Gait velocity interpretation: <1.8 ft/sec, indicate of risk for recurrent falls General Gait Details: Patient ambulated with min guard and use of RW for safety. Patient with shuffled gait pattern and cues for LLE foot clearance. Decreased stance time on RLE likely due to pain. Verbal cues for proximity to RW and straight path as patient tended to drift towards the right.   Stairs            Wheelchair Mobility    Modified Rankin (Stroke Patients Only)       Balance Overall balance assessment: Needs assistance Sitting-balance support: Feet supported;No upper extremity supported Sitting balance-Leahy Scale: Good     Standing balance support: Bilateral upper extremity supported Standing balance-Leahy Scale: Poor Standing balance comment: reliant on BUE support and use of RW to maintain standing balance                             Pertinent Vitals/Pain Pain Assessment: Faces Faces Pain Scale: Hurts little more Pain Location: R knee Pain Descriptors / Indicators: Discomfort;Grimacing Pain Intervention(s): Limited activity within patient's tolerance;Monitored during session;Repositioned    Home Living Family/patient expects to be discharged to:: Shelter/Homeless  Additional Comments: Patient reports 3-4 flights of stairs with and without rails at shelter however unsure of accuracy     Prior Function Level of  Independence: Independent with assistive device(s)         Comments: Patient reports using a cane at baseline. Initially reports using RW but later reports just using cane and does not own a RW. RN reports, patient uses wheelchair.     Hand Dominance        Extremity/Trunk Assessment   Upper Extremity Assessment Upper Extremity Assessment: Overall WFL for tasks assessed    Lower Extremity Assessment Lower Extremity Assessment: Generalized weakness;RLE deficits/detail RLE Deficits / Details: Noticable R knee swelling. Patient reports R knee has been painful since falling 2 weeks ago    Cervical / Trunk Assessment Cervical / Trunk Assessment: Normal  Communication   Communication: No difficulties  Cognition Arousal/Alertness: Awake/alert Behavior During Therapy: WFL for tasks assessed/performed Overall Cognitive Status: No family/caregiver present to determine baseline cognitive functioning                                 General Comments: Unsure of patient's baseline cognitive status as patient reports conflicting PLOF information       General Comments General comments (skin integrity, edema, etc.): Patient reports 2 falls in last month. States he was using a cane. Noticable R knee swelling.     Exercises     Assessment/Plan    PT Assessment Patient needs continued PT services  PT Problem List Decreased strength;Decreased range of motion;Decreased activity tolerance;Decreased balance;Decreased mobility;Decreased cognition;Decreased knowledge of use of DME;Decreased safety awareness;Decreased knowledge of precautions;Pain       PT Treatment Interventions DME instruction;Gait training;Stair training;Functional mobility training;Therapeutic activities;Therapeutic exercise;Balance training;Cognitive remediation;Patient/family education    PT Goals (Current goals can be found in the Care Plan section)  Acute Rehab PT Goals Patient Stated Goal: to have  less pain PT Goal Formulation: With patient Time For Goal Achievement: 10/24/18 Potential to Achieve Goals: Good    Frequency Min 3X/week   Barriers to discharge Inaccessible home environment;Decreased caregiver support Patient from homeless shelter    Co-evaluation               AM-PAC PT "6 Clicks" Mobility  Outcome Measure Help needed turning from your back to your side while in a flat bed without using bedrails?: None Help needed moving from lying on your back to sitting on the side of a flat bed without using bedrails?: None Help needed moving to and from a bed to a chair (including a wheelchair)?: A Little Help needed standing up from a chair using your arms (e.g., wheelchair or bedside chair)?: A Little Help needed to walk in hospital room?: A Little Help needed climbing 3-5 steps with a railing? : A Lot 6 Click Score: 19    End of Session Equipment Utilized During Treatment: Gait belt Activity Tolerance: Patient limited by pain Patient left: in bed;with call bell/phone within reach;with bed alarm set Nurse Communication: Mobility status;Other (comment)(R knee swelling ) PT Visit Diagnosis: Unsteadiness on feet (R26.81);Difficulty in walking, not elsewhere classified (R26.2)    Time: [id number] PT Time Calculation (min) (ACUTE ONLY): 20 min   Charges:   PT Evaluation $PT Eval Moderate Complexity: 1 Mod          Vanessa Ralphsnna Jocie Meroney, SPT  Vanessa Ralphsnna Sophonie Goforth 10/10/2018, 5:14 PM

## 2018-10-10 NOTE — Plan of Care (Signed)
  Problem: Clinical Measurements: Goal: Ability to maintain clinical measurements within normal limits will improve Outcome: Progressing Goal: Will remain free from infection Outcome: Progressing Goal: Respiratory complications will improve Outcome: Progressing Goal: Cardiovascular complication will be avoided Outcome: Progressing   Problem: Health Behavior/Discharge Planning: Goal: Ability to manage health-related needs will improve Outcome: Progressing

## 2018-10-10 NOTE — Plan of Care (Signed)
Alert and oriented X4. Pt has been oriented to the room. Skin dry and warm. Call bell within reach. Able to communicate needs. Pt has been educated to ask for assistance as he is a high risk for fall. Bed alarm turned on for safety. Tylenol was administered earlier for C/O right leg pain with some relief. No acute distress noted at this time. Will continue monitoring.

## 2018-10-10 NOTE — Progress Notes (Signed)
Progress Note    Jacob Bass  YHC:[id number] DOB: May 23, 1961  DOA: 10/09/2018 PCP: Patient, No Pcp Per    Brief Narrative:   Chief complaint: Fall  Medical records reviewed and are as summarized below:  Jacob Bass is an 58 y.o. male with past medical history significant for hypertension, tobacco abuse, and homelessness; who presented after slipping and falling in the rain.  Assessment/Plan:   Principal Problem:   Chest pain Active Problems:   Troponin level elevated   EKG abnormalities   Homelessness   Blood pressure elevated without history of HTN   Anemia, macrocytic   Thrombocytopenia (HCC)  1. Possible coronary artery disease, elevated troponin: Patient noted to have elevated troponin 0.05 ->0.05->.0.04, and abnormal EKG.  He denied any complaints of chest pain.  Echocardiogram showing EF of 60 to 65% with grade 2 diastolic dysfunction.  LDL was noted to be 72.  He was initially placed on a heparin drip and cardiology was consulted with plan for stress test.  However, after hemoglobin noted to be low symptoms thought to be possibly related to demand ischemia and heparin drip was discontinued. -May benefit as outpatient referral to cardiology   2.  Macrocytic anemia: Acute.  Patient's initial hemoglobin noted be 10.3, but on recheck this a.m. hemoglobin 7.8with elevated BUN to suggest possible upper GI bleed.  -Type and screen and recheck CBC -Check stool guaiac -Check vitamin B12 and folate  -Held on adding PPI due to QT interval -May warrant consult to gastroenterology  3.  Status post fall: Patient reports having a mechanical fall after slipping in the rain.  Physical therapy evaluated patient recommending home health PT.  4.  Essential hypertension: Blood pressure stable. -Continue lisinopril and metoprolol  5.  Elevated liver enzymes: On admission patient seen to have AST 133 with ALT 76 and total bilirubin 2.  Suspect symptoms could be related with history  of alcohol use. -Check hepatitis panel -Recheck liver enzymes in a.m.  6.  Alcohol use: Patient denies being a daily alcohol drinker. -Continue to monitor off CIWA  7.  Tobacco abuse: Patient continues to smoke cigarettes. -Counseled on the need of cessation of tobacco use -Continue nicotine patch  8.  Thrombocytopenia: Chronic.  Suspect some aspect of liver disease     Body mass index is 24.91 kg/m.   Family Communication/Anticipated D/C date and plan/Code Status   DVT prophylaxis: SCD Code Status: Full Code.  Family Communication: No family present at bedside Disposition Plan: Likely discharge tomorrow if hemoglobin stable   Medical Consultants:    Cardiology   Anti-Infectives:    None  Subjective:   Patient reports that he slipped and fell due to the rain.  Denies any pain complaints at this time or reports of dark or black stools.  Objective:    Vitals:   10/09/18 2340 10/10/18 0436 10/10/18 1236 10/10/18 2046  BP: (!) 147/77 (!) 117/59 (!) 91/43 134/65  Pulse: 86 68 (!) 59 65  Resp: 20 20 18 20   Temp: 98.3 F (36.8 C) 99.6 F (37.6 C) (!) 97.5 F (36.4 C) 99.4 F (37.4 C)  TempSrc: Oral Oral Oral Oral  SpO2: 100% 97% 100% 99%  Weight: 64.3 kg 63.8 kg    Height:        Intake/Output Summary (Last 24 hours) at 10/11/2018 0317 Last data filed at 10/10/2018 2052 Gross per 24 hour  Intake 870 ml  Output 200 ml  Net 670 ml  Filed Weights   10/09/18 1308 10/09/18 2340 10/10/18 0436  Weight: 82.1 kg 64.3 kg 63.8 kg    Exam: Constitutional: Disheveled male in NAD, calm, comfortable Eyes: PERRL, lids and conjunctivae normal ENMT: Mucous membranes are moist. Posterior pharynx clear of any exudate or lesions.  Neck: normal, supple, no masses, no thyromegaly Respiratory: clear to auscultation bilaterally, no wheezing, no crackles. Normal respiratory effort. No accessory muscle use.  Cardiovascular: Regular rate and rhythm, no murmurs / rubs /  gallops. No extremity edema. 2+ pedal pulses. No carotid bruits.  Abdomen: no tenderness, no masses palpated. No hepatosplenomegaly. Bowel sounds positive.  Musculoskeletal: no clubbing / cyanosis. No joint deformity upper and lower extremities. Good ROM, no contractures. Normal muscle tone.  Skin: no rashes, lesions, ulcers. No induration Neurologic: CN 2-12 grossly intact. Sensation intact, DTR normal. Strength 5/5 in all 4.  Psychiatric: Normal judgment and insight. Alert and oriented x 3. Normal mood.    Data Reviewed:   I have personally reviewed following labs and imaging studies:  Labs: Labs show the following:   Basic Metabolic Panel: Recent Labs  Lab 10/09/18 1315 10/09/18 1455  NA 139  --   K 4.3  --   CL 101  --   CO2 23  --   GLUCOSE 96  --   BUN 34*  --   CREATININE 0.99  --   CALCIUM 8.3*  --   MG  --  1.9   GFR Estimated Creatinine Clearance: 64.7 mL/min (by C-G formula based on SCr of 0.99 mg/dL). Liver Function Tests: Recent Labs  Lab 10/09/18 1315  AST 133*  ALT 76*  ALKPHOS 76  BILITOT 2.0*  PROT 6.7  ALBUMIN 2.5*   Recent Labs  Lab 10/09/18 1315  LIPASE 30   No results for input(s): AMMONIA in the last 168 hours. Coagulation profile No results for input(s): INR, PROTIME in the last 168 hours.  CBC: Recent Labs  Lab 10/09/18 1315 10/10/18 0313 10/10/18 0702 10/10/18 1036  WBC 9.6 7.5 7.0 7.6  NEUTROABS 6.6  --   --   --   HGB 10.3* 7.8* 8.0* 9.7*  HCT 31.1* 23.0* 24.0* 28.4*  MCV 100.6* 98.3 99.2 100.0  PLT 100* 83* 89* 108*   Cardiac Enzymes: Recent Labs  Lab 10/09/18 1725 10/09/18 1947 10/09/18 2356 10/10/18 0313 10/10/18 0520  TROPONINI 0.04* 0.05* 0.05* 0.04* 0.04*   BNP (last 3 results) No results for input(s): PROBNP in the last 8760 hours. CBG: No results for input(s): GLUCAP in the last 168 hours. D-Dimer: No results for input(s): DDIMER in the last 72 hours. Hgb A1c: No results for input(s): HGBA1C in the  last 72 hours. Lipid Profile: Recent Labs    10/10/18 0702  CHOL 107  HDL 21*  LDLCALC 72  TRIG 69  CHOLHDL 5.1   Thyroid function studies: No results for input(s): TSH, T4TOTAL, T3FREE, THYROIDAB in the last 72 hours.  Invalid input(s): FREET3 Anemia work up: Recent Labs    10/10/18 0702  VITAMINB12 313   Sepsis Labs: Recent Labs  Lab 10/09/18 1315 10/10/18 0313 10/10/18 0702 10/10/18 1036  WBC 9.6 7.5 7.0 7.6    Microbiology No results found for this or any previous visit (from the past 240 hour(s)).  Procedures and diagnostic studies:  Dg Chest 2 View  Result Date: 10/09/2018 CLINICAL DATA:  Chest pain, fall today.  History of CHF. EXAM: CHEST - 2 VIEW COMPARISON:  Chest radiograph May 17, 2016 FINDINGS: Cardiac  silhouette is upper limits of normal size. Mild pulmonary vascular congestion without pleural effusion or focal consolidation. Biapical pleural thickening. No pneumothorax. Upper thoracic dextroscoliosis. IMPRESSION: Borderline cardiomegaly and mild pulmonary vascular congestion. Electronically Signed   By: Awilda Metro M.D.   On: 10/09/2018 15:33   Dg Hip Unilat W Or Wo Pelvis 2-3 Views Right  Result Date: 10/09/2018 CLINICAL DATA:  Larey Seat today.  Struck by Librarian, academic in 2007. EXAM: DG HIP (WITH OR WITHOUT PELVIS) 2-3V RIGHT COMPARISON:  None. FINDINGS: Femoral heads are located. No dislocation. Severe RIGHT hip joint space narrowing with subchondral cyst formation, chronic flattening of the humeral head. RIGHT hip heterotopic ossification. Mild LEFT hip joint space narrowing and marginal spurring. No destructive bony lesions. Sacroiliac joints are symmetric. Mild vascular calcifications. IMPRESSION: 1. No acute fracture deformity or dislocation. 2. 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 3.  Aortic Atherosclerosis (ICD10-I70.0). Electronically Signed   By: Awilda Metro M.D.   On: 10/09/2018 17:44     Medications:   . lisinopril  10 mg Oral Daily  . metoprolol tartrate  25 mg Oral BID   Continuous Infusions: None   LOS: 0 days    A   [facility]   *Please refer to [email or web address], password [email or web address] to get updated schedule on who will round on this patient, as hospitalists switch teams weekly. If 7PM-7AM, please contact night-coverage at [email or web address], password [email or web address] for any overnight needs.

## 2018-10-10 NOTE — Progress Notes (Signed)
Lunch tray ordered. Patient no longer NPO

## 2018-10-10 NOTE — Progress Notes (Signed)
ANTICOAGULATION CONSULT NOTE - Follow-up Consult  Pharmacy Consult for heparin Indication: chest pain/ACS  Allergies  Allergen Reactions  . Penicillins Hives    Patient Measurements: Height: 5\' 3"  (160 cm) Weight: 140 lb 9.6 oz (63.8 kg)(scale a) IBW/kg (Calculated) : 56.9 Heparin Dosing Weight: 74.4 kg  Vital Signs: Temp: 99.6 F (37.6 C) (02/07 0436) Temp Source: Oral (02/07 0436) BP: 117/59 (02/07 0436) Pulse Rate: 68 (02/07 0436)  Labs: Recent Labs    10/09/18 1315  10/09/18 1947 10/09/18 2356 10/10/18 0313 10/10/18 0520  HGB 10.3*  --   --   --  7.8*  --   HCT 31.1*  --   --   --  23.0*  --   PLT 100*  --   --   --  83*  --   HEPARINUNFRC  --   --   --   --   --  0.23*  CREATININE 0.99  --   --   --   --   --   TROPONINI 0.04*   < > 0.05* 0.05* 0.04*  --    < > = values in this interval not displayed.    Estimated Creatinine Clearance: 64.7 mL/min (by C-G formula based on SCr of 0.99 mg/dL).   Assessment: 57 yom s/p mechanical fall, found to have abnormal EKG and elevated troponin (0.04>0.05). No AC PTA.  Heparin level subtherapeutic (0.23) on gtt at 900 units/hr. No issues with line or bleeding reported per RN. Plan for stress test today.  Goal of Therapy:  Heparin level 0.3-0.7 units/ml Monitor platelets by anticoagulation protocol: Yes   Plan:  Increase heparin to 1050 units/hr Will f/u 6 hr heparin level  Christoper Fabian, PharmD, BCPS Clinical pharmacist  **Pharmacist phone directory can now be found on [email or web address] ([email or web address]).  Listed under [facility]. 10/10/2018,6:21 AM

## 2018-10-10 NOTE — Progress Notes (Signed)
*  PRELIMINARY RESULTS* Echocardiogram 2D Echocardiogram has been performed.  Jacob Bass 10/10/2018, 2:30 PM

## 2018-10-10 NOTE — Clinical Social Work Note (Signed)
Clinical Social Work Assessment  Patient Details  Name: Jacob Bass MRN: [id number] Date of Birth: [birth year]  Date of referral:  10/10/18               Reason for consult:  Housing Concerns/Homelessness                Permission sought to share information with:    Permission granted to share information::  No  Name::        Agency::     Relationship::     Contact Information:     Housing/Transportation Living arrangements for the past 2 months:  [place] of Information:  Patient, Medical Team Patient Interpreter Needed:  None Criminal Activity/Legal Involvement Pertinent to Current Situation/Hospitalization:  No - Comment as needed Significant Relationships:  None Lives with:  Self Do you feel safe going back to the place where you live?  Yes Need for family participation in patient care:  Yes (Comment)  Care giving concerns:  Homelessness.   Social Worker assessment / plan:  CSW met with patient. No supports at bedside. CSW introduced role and inquired about needs. Patient confirmed he has been staying at the [employer]. He is agreeable to shelter list, emergency assistance list, and booklets for free meals and food pantries. Patient declined substance abuse resources for alcohol use. Bus pass is at front desk for when patient is discharged. No further concerns. CSW signing off as social work intervention is no longer needed.  Employment status:  Unemployed Forensic scientist:  Self Pay (Medicaid Pending) PT Recommendations:  Not assessed at this time Information / Referral to community resources:  Shelter, Other (Comment Required), Residential Substance Abuse Treatment Options, Outpatient Substance Abuse Treatment Options(Emergency assistance, Free meals and food pantries, bus pass.)  Patient/Family's Response to care:  Patient agreeable to shelter and emergency assistance resources but declined substance abuse resources. Patient's support system  unknown. Patient appreciated social work intervention.  Patient/Family's Understanding of and Emotional Response to Diagnosis, Current Treatment, and Prognosis:  Patient has a good understanding of the reason for admission and social work consult. Patient appears happy with hospital care.  Emotional Assessment Appearance:  Appears stated age Attitude/Demeanor/Rapport:  Engaged, Gracious Affect (typically observed):  Accepting, Appropriate, Calm, Pleasant Orientation:  Oriented to Self, Oriented to Place, Oriented to  Time, Oriented to Situation Alcohol / Substance use:  Alcohol Use Psych involvement (Current and /or in the community):  No (Comment)  Discharge Needs  Concerns to be addressed:  Care Coordination Readmission within the last 30 days:  No Current discharge risk:  Homeless Barriers to Discharge:  Continued Medical Work up   Candie Chroman, LCSW 10/10/2018, 1:47 PM

## 2018-10-11 DIAGNOSIS — R29715 NIHSS score 15: Secondary | ICD-10-CM | POA: Diagnosis not present

## 2018-10-11 DIAGNOSIS — K254 Chronic or unspecified gastric ulcer with hemorrhage: Secondary | ICD-10-CM | POA: Diagnosis not present

## 2018-10-11 DIAGNOSIS — I609 Nontraumatic subarachnoid hemorrhage, unspecified: Secondary | ICD-10-CM | POA: Diagnosis not present

## 2018-10-11 DIAGNOSIS — J449 Chronic obstructive pulmonary disease, unspecified: Secondary | ICD-10-CM | POA: Diagnosis present

## 2018-10-11 DIAGNOSIS — I471 Supraventricular tachycardia: Secondary | ICD-10-CM | POA: Diagnosis not present

## 2018-10-11 DIAGNOSIS — D539 Nutritional anemia, unspecified: Secondary | ICD-10-CM | POA: Diagnosis not present

## 2018-10-11 DIAGNOSIS — Z590 Homelessness: Secondary | ICD-10-CM | POA: Diagnosis not present

## 2018-10-11 DIAGNOSIS — B370 Candidal stomatitis: Secondary | ICD-10-CM | POA: Diagnosis not present

## 2018-10-11 DIAGNOSIS — E870 Hyperosmolality and hypernatremia: Secondary | ICD-10-CM | POA: Diagnosis not present

## 2018-10-11 DIAGNOSIS — R195 Other fecal abnormalities: Secondary | ICD-10-CM | POA: Diagnosis present

## 2018-10-11 DIAGNOSIS — I639 Cerebral infarction, unspecified: Secondary | ICD-10-CM | POA: Diagnosis not present

## 2018-10-11 DIAGNOSIS — D689 Coagulation defect, unspecified: Secondary | ICD-10-CM | POA: Diagnosis not present

## 2018-10-11 DIAGNOSIS — D696 Thrombocytopenia, unspecified: Secondary | ICD-10-CM

## 2018-10-11 DIAGNOSIS — W010XXA Fall on same level from slipping, tripping and stumbling without subsequent striking against object, initial encounter: Secondary | ICD-10-CM | POA: Diagnosis present

## 2018-10-11 DIAGNOSIS — Y9289 Other specified places as the place of occurrence of the external cause: Secondary | ICD-10-CM | POA: Diagnosis not present

## 2018-10-11 DIAGNOSIS — I248 Other forms of acute ischemic heart disease: Secondary | ICD-10-CM | POA: Diagnosis not present

## 2018-10-11 DIAGNOSIS — G9341 Metabolic encephalopathy: Secondary | ICD-10-CM | POA: Diagnosis not present

## 2018-10-11 DIAGNOSIS — K7030 Alcoholic cirrhosis of liver without ascites: Secondary | ICD-10-CM | POA: Diagnosis present

## 2018-10-11 DIAGNOSIS — E876 Hypokalemia: Secondary | ICD-10-CM | POA: Diagnosis present

## 2018-10-11 DIAGNOSIS — R29713 NIHSS score 13: Secondary | ICD-10-CM | POA: Diagnosis not present

## 2018-10-11 DIAGNOSIS — I509 Heart failure, unspecified: Secondary | ICD-10-CM | POA: Diagnosis present

## 2018-10-11 DIAGNOSIS — R29709 NIHSS score 9: Secondary | ICD-10-CM | POA: Diagnosis not present

## 2018-10-11 DIAGNOSIS — E162 Hypoglycemia, unspecified: Secondary | ICD-10-CM | POA: Diagnosis present

## 2018-10-11 DIAGNOSIS — G8194 Hemiplegia, unspecified affecting left nondominant side: Secondary | ICD-10-CM | POA: Diagnosis not present

## 2018-10-11 DIAGNOSIS — R29714 NIHSS score 14: Secondary | ICD-10-CM | POA: Diagnosis not present

## 2018-10-11 DIAGNOSIS — I110 Hypertensive heart disease with heart failure: Secondary | ICD-10-CM | POA: Diagnosis present

## 2018-10-11 DIAGNOSIS — R531 Weakness: Secondary | ICD-10-CM

## 2018-10-11 LAB — HEPATITIS PANEL, ACUTE
HCV Ab: >11 {s_co_ratio} — ABNORMAL HIGH (ref 0.0–0.9)
Hep A IgM: NEGATIVE
Hep B C IgM: NEGATIVE
Hepatitis B Surface Ag: NEGATIVE

## 2018-10-11 LAB — HEPATIC FUNCTION PANEL
ALT: 63 U/L — ABNORMAL HIGH (ref 0–44)
AST: 95 U/L — ABNORMAL HIGH (ref 15–41)
Albumin: 2 g/dL — ABNORMAL LOW (ref 3.5–5.0)
Alkaline Phosphatase: 65 U/L (ref 38–126)
Bilirubin, Direct: 0.5 mg/dL — ABNORMAL HIGH (ref 0.0–0.2)
Indirect Bilirubin: 0.7 mg/dL (ref 0.3–0.9)
Total Bilirubin: 1.2 mg/dL (ref 0.3–1.2)
Total Protein: 5.4 g/dL — ABNORMAL LOW (ref 6.5–8.1)

## 2018-10-11 LAB — CBC
HCT: 24.4 % — ABNORMAL LOW (ref 39.0–52.0)
Hemoglobin: 8.3 g/dL — ABNORMAL LOW (ref 13.0–17.0)
MCH: 34.2 pg — ABNORMAL HIGH (ref 26.0–34.0)
MCHC: 34 g/dL (ref 30.0–36.0)
MCV: 100.4 fL — ABNORMAL HIGH (ref 80.0–100.0)
Platelets: 99 10*3/uL — ABNORMAL LOW (ref 150–400)
RBC: 2.43 MIL/uL — ABNORMAL LOW (ref 4.22–5.81)
RDW: 13.5 % (ref 11.5–15.5)
WBC: 5.4 10*3/uL (ref 4.0–10.5)
nRBC: 0 % (ref 0.0–0.2)

## 2018-10-11 LAB — PROTIME-INR
INR: 1.23
Prothrombin Time: 15.4 seconds — ABNORMAL HIGH (ref 11.4–15.2)

## 2018-10-11 MED ORDER — LORAZEPAM 1 MG PO TABS: 1.0000 mg | ORAL_TABLET | Freq: Four times a day (QID) | ORAL | Status: AC | PRN

## 2018-10-11 MED ORDER — PANTOPRAZOLE SODIUM 40 MG PO TBEC
40.0000 mg | DELAYED_RELEASE_TABLET | Freq: Every day | ORAL | Status: DC
  Administered 2018-10-11 – 2018-10-18 (×6): 40 mg via ORAL
  Filled 2018-10-11 (×7): qty 1

## 2018-10-11 MED ORDER — LORAZEPAM 2 MG/ML IJ SOLN
1.0000 mg | Freq: Four times a day (QID) | INTRAMUSCULAR | Status: AC | PRN
  Administered 2018-10-13 (×2): 1 mg via INTRAVENOUS
  Filled 2018-10-11 (×3): qty 1

## 2018-10-11 MED ORDER — FOLIC ACID 1 MG PO TABS
1.0000 mg | ORAL_TABLET | Freq: Every day | ORAL | Status: DC
  Administered 2018-10-11 – 2018-10-27 (×14): 1 mg via ORAL
  Filled 2018-10-11 (×15): qty 1

## 2018-10-11 MED ORDER — ADULT MULTIVITAMIN W/MINERALS CH
1.0000 | ORAL_TABLET | Freq: Every day | ORAL | Status: DC
  Administered 2018-10-11 – 2018-10-27 (×14): 1 via ORAL
  Filled 2018-10-11 (×16): qty 1

## 2018-10-11 MED ORDER — VITAMIN B-1 100 MG PO TABS
100.0000 mg | ORAL_TABLET | Freq: Every day | ORAL | Status: DC
  Administered 2018-10-11 – 2018-10-27 (×14): 100 mg via ORAL
  Filled 2018-10-11 (×16): qty 1
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
## 2018-10-12 ENCOUNTER — Inpatient Hospital Stay (HOSPITAL_COMMUNITY): Payer: Medicaid Other

## 2018-10-12 DIAGNOSIS — R195 Other fecal abnormalities: Secondary | ICD-10-CM

## 2018-10-12 DIAGNOSIS — R768 Other specified abnormal immunological findings in serum: Secondary | ICD-10-CM

## 2018-10-12 DIAGNOSIS — K7030 Alcoholic cirrhosis of liver without ascites: Secondary | ICD-10-CM

## 2018-10-12 DIAGNOSIS — D539 Nutritional anemia, unspecified: Principal | ICD-10-CM

## 2018-10-12 DIAGNOSIS — R945 Abnormal results of liver function studies: Secondary | ICD-10-CM

## 2018-10-12 LAB — GLUCOSE, CAPILLARY
Glucose-Capillary: 67 mg/dL — ABNORMAL LOW (ref 70–99)
Glucose-Capillary: 95 mg/dL (ref 70–99)
Glucose-Capillary: 94 mg/dL (ref 70–99)

## 2018-10-12 LAB — HCV RNA QUANT
HCV Quantitative Log: 5.893 log10 IU/mL (ref >1.70)
HCV Quantitative: 781000 IU/mL (ref >50)

## 2018-10-12 MED ORDER — DEXTROSE 50 % IV SOLN
INTRAVENOUS | Status: AC
  Administered 2018-10-12: 50 mL
  Filled 2018-10-12: qty 50

## 2018-10-12 MED ORDER — PEG 3350-KCL-NABCB-NACL-NASULF 236 G PO SOLR
4000.0000 mL | Freq: Once | ORAL | Status: DC
  Filled 2018-10-12: qty 4000

## 2018-10-12 MED ORDER — HALOPERIDOL LACTATE 5 MG/ML IJ SOLN
5.0000 mg | Freq: Once | INTRAMUSCULAR | Status: DC
  Filled 2018-10-12 (×2): qty 1

## 2018-10-12 NOTE — Progress Notes (Signed)
Progress Note    Jacob Bass  YWV:[id number] DOB: 01/14/1961  DOA: 10/09/2018 PCP: Patient, No Pcp Per    Brief Narrative:   Chief complaint: Fall  Medical records reviewed and are as summarized below:  Jacob Bass is an 58 y.o. male with past medical history significant for hypertension, tobacco abuse, and homelessness; who presented after slipping and falling in the rain.    10/12/2018: On presentation, troponin was noted to be minimally elevated.  Cardiology team was consulted, and the patient has been cleared by the cardiology team.  GI team was consulted for anemia.  Patient will undergo EGD and colonoscopy tomorrow.  We will also consult PT OT.  Assessment/Plan:   Principal Problem:   Anemia, macrocytic Active Problems:   Chest pain   Hepatitis C antibody positive in blood   EKG abnormalities   Homelessness   Blood pressure elevated without history of HTN   Thrombocytopenia (HCC)  1.  Macrocytic anemia: Acute.  Patient's initial hemoglobin noted be 10.3, but on recheck this a.m. hemoglobin 7.8 with elevated BUN to suggest possible upper GI bleed.  Patient was noted to have a positive stool guaiac, but repeat hemoglobin and hematocrit noted to trend upwards to 9.7 on hospital day 2.  Repeat CBC on hospital day 3 noted to be 8.3.  Vitamin B12 low normal at 313.  Bleeding could have been related with heparin drip patient was initially started on for elevated troponins, but the heparin drip has been since discontinued. -Folate pending -Recheck CBC in a.m. -Protonix daily(previous QT noted to be borderline) repeat EKG in a.m. -Consulted gastroenterology, will follow-up for further recommendations 10/12/2018: For EGD and colonoscopy tomorrow.  GI input is highly appreciated.  2. NSTEMI/elevated troponin: Patient noted to have elevated troponin 0.05 ->0.05->.0.04, and abnormal EKG.  He denied any complaints of chest pain.  Echocardiogram showing EF of 60 to 65% with grade 2  diastolic dysfunction.  LDL was noted to be 72.  He was initially placed on a heparin drip and cardiology was consulted with plan for stress test.  However, after hemoglobin noted to be low symptoms thought to be possibly related to demand ischemia and heparin drip was discontinued. -May benefit as outpatient referral to cardiology 10/12/2018: Cleared by cardiology for discharge.  Patient will follow cardiology team on discharge.  3.  Hepatitis C, Elevated liver enzymes: Acute.  On admission patient seen to have AST 133 with ALT 76 and total bilirubin 2.  Hepatitis panel was noted to be positive for hepatitis C antibodies.  There is concern for possibility of cirrhosis meld score would be approximately 9-11. -Follow-up quantitative hep C RNA  -Follow-up right upper quadrant ultrasound  4.  Status post fall: Patient reports having a mechanical fall after slipping in the rain.  Physical therapy evaluated and recommended home health with physical therapy however patient is homeless. 10/12/2018: Await PT OT input.  Short-term rehabilitation.  5.  Essential hypertension: Blood pressure stable. -Continue lisinopril and metoprolol as tolerated  6.   Alcohol use: Patient denies being a daily alcohol drinker has not shown any significant signs of withdrawal. -CIWA protocol without scheduled Ativan -Continue folic acid and thiamine   7.  Thrombocytopenia: Chronic.  Suspect possible liver cirrhosis  8.  Tobacco abuse: Patient continues to smoke cigarettes. -Counseled on the need of cessation of tobacco use -Continue nicotine patch   Body mass index is 24.8 kg/m.   Family Communication/Anticipated D/C date and plan/Code Status  DVT prophylaxis: SCD Code Status: Full Code.  Family Communication: No family present at bedside Disposition Plan: Likely discharge tomorrow if hemoglobin stable   Medical Consultants:    Cardiology  Gastroenterology   Anti-Infectives:    None  Subjective:    Patient seen.  Patient is a poor historian. No chest pain.  No shortness of breath. Awaiting EGD and colonoscopy in the morning.  Objective:    Vitals:   10/11/18 1251 10/11/18 2056 10/12/18 0521 10/12/18 1117  BP: 138/69 137/70 102/71 128/66  Pulse: (!) 55 60 (!) 58 61  Resp: 18 18 16 18   Temp: 99 F (37.2 C) 98.8 F (37.1 C) 98.6 F (37 C) 98.5 F (36.9 C)  TempSrc: Oral Oral Oral Oral  SpO2: 100% 100% 99% 100%  Weight:   63.5 kg   Height:        Intake/Output Summary (Last 24 hours) at 10/12/2018 1229 Last data filed at 10/12/2018 0532 Gross per 24 hour  Intake -  Output 250 ml  Net -250 ml   Filed Weights   10/10/18 0436 10/11/18 0558 10/12/18 0521  Weight: 63.8 kg 64 kg 63.5 kg    Exam: Constitutional: Disheveled male in NAD, calm, comfortable Eyes: Pallor.  No jaundice. ENMT: Mucous membranes are moist. Posterior pharynx clear of any exudate or lesions.  Poor dentition Neck: normal, supple, no masses, no thyromegaly Respiratory: clear to auscultation.   Cardiovascular: S1-S2.   Abdomen: Obese, soft and nontender.  Organs are not palpable.   Musculoskeletal: Limited movement of the left shoulder joint. Neurologic: Awake and alert.  Patient moves all limbs. Data Reviewed:   I have personally reviewed following labs and imaging studies:  Labs: Labs show the following:   Basic Metabolic Panel: Recent Labs  Lab 10/09/18 1315 10/09/18 1455  NA 139  --   K 4.3  --   CL 101  --   CO2 23  --   GLUCOSE 96  --   BUN 34*  --   CREATININE 0.99  --   CALCIUM 8.3*  --   MG  --  1.9   GFR Estimated Creatinine Clearance: 64.7 mL/min (by C-G formula based on SCr of 0.99 mg/dL). Liver Function Tests: Recent Labs  Lab 10/09/18 1315 10/11/18 0604  AST 133* 95*  ALT 76* 63*  ALKPHOS 76 65  BILITOT 2.0* 1.2  PROT 6.7 5.4*  ALBUMIN 2.5* 2.0*   Recent Labs  Lab 10/09/18 1315  LIPASE 30   No results for input(s): AMMONIA in the last 168  hours. Coagulation profile Recent Labs  Lab 10/11/18 1242  INR 1.23    CBC: Recent Labs  Lab 10/09/18 1315 10/10/18 0313 10/10/18 0702 10/10/18 1036 10/11/18 0604  WBC 9.6 7.5 7.0 7.6 5.4  NEUTROABS 6.6  --   --   --   --   HGB 10.3* 7.8* 8.0* 9.7* 8.3*  HCT 31.1* 23.0* 24.0* 28.4* 24.4*  MCV 100.6* 98.3 99.2 100.0 100.4*  PLT 100* 83* 89* 108* 99*   Cardiac Enzymes: Recent Labs  Lab 10/09/18 1725 10/09/18 1947 10/09/18 2356 10/10/18 0313 10/10/18 0520  TROPONINI 0.04* 0.05* 0.05* 0.04* 0.04*   BNP (last 3 results) No results for input(s): PROBNP in the last 8760 hours. CBG: No results for input(s): GLUCAP in the last 168 hours. D-Dimer: No results for input(s): DDIMER in the last 72 hours. Hgb A1c: No results for input(s): HGBA1C in the last 72 hours. Lipid Profile: Recent Labs  10/10/18 0702  CHOL 107  HDL 21*  LDLCALC 72  TRIG 69  CHOLHDL 5.1   Thyroid function studies: No results for input(s): TSH, T4TOTAL, T3FREE, THYROIDAB in the last 72 hours.  Invalid input(s): FREET3 Anemia work up: Recent Labs    10/10/18 0702  VITAMINB12 313   Sepsis Labs: Recent Labs  Lab 10/10/18 0313 10/10/18 0702 10/10/18 1036 10/11/18 0604  WBC 7.5 7.0 7.6 5.4    Microbiology No results found for this or any previous visit (from the past 240 hour(s)).  Procedures and diagnostic studies:  [place] Abdomen Complete  Result Date: 10/12/2018 CLINICAL DATA:  Cirrhosis. EXAM: ABDOMEN ULTRASOUND COMPLETE COMPARISON:  None. FINDINGS: Gallbladder: Numerous small gallstones are noted in the gallbladder with associated acoustic shadowing. The largest calculus measures 8 mm. Diffusely thickened gallbladder wall but no pericholecystic fluid and negative sonographic Murphy sign. Common bile duct: Diameter: 2.8 mm Liver: Very irregular/nodular liver contour with heterogeneous echogenicity consistent with cirrhosis. No focal hepatic lesions or intrahepatic biliary dilatation.  Portal vein is patent on color Doppler imaging with normal direction of blood flow towards the liver. IVC: Normal caliber. Pancreas: Poorly visualized. Spleen: Normal size.  No focal lesions. Right Kidney: Length: 9.5 x 5.9 x 5.6 cm. Normal renal cortical thickness and echogenicity without focal lesions or hydronephrosis. Left Kidney: Length: 8.4 x 3.7 x 3.9 cm. Normal renal cortical thickness and echogenicity without focal lesions or hydronephrosis. Abdominal aorta: Poorly visualized. Other findings: No ascites. IMPRESSION: 1. Cirrhotic changes involving the liver but no focal hepatic lesion is identified. 2. Gallstones and diffuse gallbladder wall thickening but no pericholecystic fluid or sonographic Murphy sign. 3. No intra or extrahepatic biliary dilatation. 4. Poor visualization of the pancreas and aorta. 5. No splenomegaly or ascites. Electronically Signed   By: Rudie MeyerP.  Gallerani M.D.   On: 10/12/2018 04:28    Medications:   . folic acid  1 mg Oral Daily  . lisinopril  10 mg Oral Daily  . metoprolol tartrate  25 mg Oral BID  . multivitamin with minerals  1 tablet Oral Daily  . pantoprazole  40 mg Oral Daily  . polyethylene glycol  4,000 mL Oral Once  . thiamine  100 mg Oral Daily   Continuous Infusions: None   LOS: 1 day   Barnetta ChapelSylvester I Ogbata  [facility]   *Please refer to [email or web address], password [email or web address] to get updated schedule on who will round on this patient, as hospitalists switch teams weekly. If 7PM-7AM, please contact night-coverage at [email or web address], password [email or web address] for any overnight needs.

## 2018-10-12 NOTE — Progress Notes (Addendum)
Patient ID: Jacob Bass, male   DOB: [birth year], 58 y.o.   MRN: [id number]     Progress Note   Subjective   Pt has no complaints , no active bleeding  HGB 8.3  [place] - Cirrhosis,no hepatic mass no ascites, gallstones   Echo- EF 60_65%  Hep C RNA  Quant - pending   Objective   Vital signs in last 24 hours: Temp:  [98.6 F (37 C)-99 F (37.2 C)] 98.6 F (37 C) (02/09 0521) Pulse Rate:  [55-60] 58 (02/09 0521) Resp:  [16-18] 16 (02/09 0521) BP: (102-138)/(69-71) 102/71 (02/09 0521) SpO2:  [99 %-100 %] 99 % (02/09 0521) Weight:  [63.5 kg] 63.5 kg (02/09 0521) Last BM Date: 10/10/18 General:    Older AA male  in NAD Heart:  Regular rate and rhythm; no murmurs Lungs: Respirations even and unlabored, scattered rhonchi Abdomen:  Soft, nontender and nondistended. Normal bowel sounds. Extremities:  Without edema. Neurologic:  Alert and oriented,  grossly normal neurologically. Psych:  Cooperative. Normal mood and affect.  Intake/Output from previous day: 02/08 0701 - 02/09 0700 In: -  Out: 250 [Urine:250] Intake/Output this shift: No intake/output data recorded.  Lab Results: Recent Labs    10/10/18 0702 10/10/18 1036 10/11/18 0604  WBC 7.0 7.6 5.4  HGB 8.0* 9.7* 8.3*  HCT 24.0* 28.4* 24.4*  PLT 89* 108* 99*   BMET Recent Labs    10/09/18 1315  NA 139  K 4.3  CL 101  CO2 23  GLUCOSE 96  BUN 34*  CREATININE 0.99  CALCIUM 8.3*   LFT Recent Labs    10/11/18 0604  PROT 5.4*  ALBUMIN 2.0*  AST 95*  ALT 63*  ALKPHOS 65  BILITOT 1.2  BILIDIR 0.5*  IBILI 0.7   PT/INR Recent Labs    10/11/18 1242  LABPROT 15.4*  INR 1.23    Studies/Results: US Abdomen Complete  Result Date: 10/12/2018 CLINICAL DATA:  Cirrhosis. EXAM: ABDOMEN ULTRASOUND COMPLETE COMPARISON:  None. FINDINGS: Gallbladder: Numerous small gallstones are noted in the gallbladder with associated acoustic shadowing. The largest calculus measures 8 mm. Diffusely thickened gallbladder wall but  no pericholecystic fluid and negative sonographic Murphy sign. Common bile duct: Diameter: 2.8 mm Liver: Very irregular/nodular liver contour with heterogeneous echogenicity consistent with cirrhosis. No focal hepatic lesions or intrahepatic biliary dilatation. Portal vein is patent on color Doppler imaging with normal direction of blood flow towards the liver. IVC: Normal caliber. Pancreas: Poorly visualized. Spleen: Normal size.  No focal lesions. Right Kidney: Length: 9.5 x 5.9 x 5.6 cm. Normal renal cortical thickness and echogenicity without focal lesions or hydronephrosis. Left Kidney: Length: 8.4 x 3.7 x 3.9 cm. Normal renal cortical thickness and echogenicity without focal lesions or hydronephrosis. Abdominal aorta: Poorly visualized. Other findings: No ascites. IMPRESSION: 1. Cirrhotic changes involving the liver but no focal hepatic lesion is identified. 2. Gallstones and diffuse gallbladder wall thickening but no pericholecystic fluid or sonographic Murphy sign. 3. No intra or extrahepatic biliary dilatation. 4. Poor visualization of the pancreas and aorta. 5. No splenomegaly or ascites. Electronically Signed   By: Jacob Bass M.D.   On: 10/12/2018 04:28       Assessment / Plan:     #1 58 yo male , homeless- admitted after a fall- Chest soreness- cardiac w/u  neagtive  #2 Found macrocytic anemia, heme + stool - no active bleeding Etiology unclear  Pt with hx chronic ETOH use - will need to R/O portal; gastropathy, PUD, upper  or lower GI malignancy   #3 Cirrhosis- confirmed on [place] , no ascites-  Very likely ETOH induced- hep C ab + - RNA quant pending  check AFP #4 Gallstones - GB wall thickening - incidental findings - pt has no sxs . GB wall thickening  Can be seen with Cirrhosis /liver disease - do not need to pursue further workup   Plan; Clear liquids today , NPO after MN  Have scheduled for  EGD and Colonoscopy with Dr Jacob Bass tomorrow  Am - prep later today . Procedures  discussed again in detail with pt  And he is agreeable to proceed   F/U  Hep C RNA - though unlikely to be a candidate for treatment  Serial HGb's Continue daily PPI    Principal Problem:   Anemia, macrocytic Active Problems:   Chest pain   Hepatitis C antibody positive in blood   EKG abnormalities   Homelessness   Blood pressure elevated without history of HTN   Thrombocytopenia (HCC)     LOS: 1 day   Jacob Bass  10/12/2018, 9:47 AM   I have discussed the case with the PA, and that is the plan I formulated. I personally interviewed and examined the patient.  CC: Anemia  He is clinically unchanged from yesterday.  Results as noted above indicating cirrhosis with thrombocytopenia and mild coagulopathy.  I agree he is not presently a treatment candidate for hepatitis C due to unstable social situation. He has incidental gallstones, I would not call a surgical consult since they are asymptomatic.  Gallbladder wall thickening is due to his cirrhosis and liver disease.  Upper endoscopy and colonoscopy tomorrow as scheduled with Dr. Adela Bass.   Total time 25 minutes Jacob Bass Office: [phone]

## 2018-10-12 NOTE — Progress Notes (Addendum)
Paged MD- patient trying to get out of bed.  Also asked about brain MRI- tried to call Daytime MD- but no return call.     MD going to find out about Brain MRI and will order haldol for pt to see if can get him to calm down.  May need sitter.  Will reassess after giving medication

## 2018-10-12 NOTE — Significant Event (Signed)
Rapid Response Event Note  Overview:  Called to see patient with neurological changes Time Called: 1656 Arrival Time: 1701 Event Type: Neurologic  Initial Focused Assessment:  On arrival patient sitting in chair - alert - oriented but somewhat slow to respond.  Left facial droop noted with some drooling.  Left arm weak - unable to resist gravity but can move.  Slight dysarthria noted.  RN states patient walked to chair but had trouble with left side.  LSW is unknown - reports that he had some left side weakness and slurred speech this AM but with unknown history not sure if this was new or acute.  BP 152/82 RR 18 O2 sats  100% on RA.     Interventions:  CBG checked 67 - treated with 1/2 amp D50.  Back to bed and HOB down.  Dr. Marthenia Rolling to bedside - requesting to call code stroke.  Code stroke called at 3013 - to CT scan where we were met by Dr. Cheral Marker.  CT done- negative for bleed.  Exam by Dr. Cheral Marker - no acute stroke treatment - unknown LSW.  Back to 3E05.  [facility] swallow screen done - patient passed.  Speech more clear - mild facial droop remains - left arm remains same.  Repeat CBG 95.  Handoff to [employer].    Plan of Care (if not transferred):  Event Summary: Name of Physician Notified: Dr. Marthenia Rolling at (pta RRT)  Name of Consulting Physician Notified: Dr. Cheral Marker at 1714  Outcome: Stayed in room and stabalized  Event End Time: (1755)  Quin Hoop

## 2018-10-12 NOTE — Consult Note (Addendum)
NEURO HOSPITALIST  CONSULT   Requesting Physician: Dr. Dartha Lodge    Chief Complaint: Weakness and slurred speech   History obtained from:  Chart   HPI:                                                                                                                                         Jacob Bass is a 58 y.o. male with a PMHx of HTN and CHF who presented to the [facility] ED on 10/09/2018 s/p fall. Code stroke was called this evening for neuro decline in the context of hypoglycemia.  Per nursing patient had some slurred speech on assessment this morning. Patient was getting up to go to chair and left leg was noticed to be weak. Words were also more slurred than usual. Patient is on day 3 of CIWA protocol and also had a (+) hemoccult stool. Patient is homeless and incontinent of urine at baseline. Patient does smoke cigarettes. Per RN patient's mental status was at 10% of baseline when code stroke was called and hypoglycemia was noted, but in CT was back to 75% of his normal following IV glucose.   Hospital course:  CTH: no hemorrhage CIWA protocol day 3. Hepatitis C BP: 152/82  BG: 67 Date last known well: 10/12/2018 Time last known well:unknown tPA Given: No: unknown LKW  Modified Rankin: Rankin Score=0  NIHSS:9 1a Level of Conscious:0 1b LOC Questions: 0 1c LOC Commands: 0 2 Best Gaze: 0 3 Visual: 0 4 Facial Palsy: 1 5a Motor Arm - left: 3 5b Motor Arm - Right:0  6a Motor Leg - Left: 2 6b Motor Leg - Right:2 7 Limb Ataxia: 0 8 Sensory: 0 9 Best Language: 0 10 Dysarthria:1 11 Extinct. and Inattention:0 TOTAL: 9   Past Medical History:  Diagnosis Date  . CHF (congestive heart failure) (HCC)   . Hypertension     History reviewed. No pertinent surgical history.  History reviewed. No pertinent family history.       Social History:  reports that he has been smoking cigarettes. He has been smoking about 2.00 packs per day. He has  never used smokeless tobacco. He reports current alcohol use. He reports that he does not use drugs.  Allergies:  Allergies  Allergen Reactions  . Penicillins Hives    Medications:  Scheduled: . dextrose      . folic acid  1 mg Oral Daily  . lisinopril  10 mg Oral Daily  . metoprolol tartrate  25 mg Oral BID  . multivitamin with minerals  1 tablet Oral Daily  . pantoprazole  40 mg Oral Daily  . polyethylene glycol  4,000 mL Oral Once  . thiamine  100 mg Oral Daily   Continuous:  QPY:[id number], LORazepam **OR** LORazepam, morphine injection, ondansetron (ZOFRAN) IV   ROS:                                                                                                                                        unobtainable from patient due to mental status and lack of cooperation   General Examination:                                                                                                      Blood pressure (!) 152/82, pulse (!) 56, temperature 98.5 F (36.9 C), temperature source Oral, resp. rate 18, height 5\' 3"  (1.6 m), weight 63.5 kg, SpO2 100 %.  HEENT-  [place]/AT  Cardiovascular- Respirations unlabored  Neurological Examination Mental Status: Patient is awake and alert. Oriented to name, age, year, but not to season or month. Cranial Nerves: PERRL, saccadic visual pursuits. Horizontal EOM are full. Not cooperative with upward or downward gaze. Tongue deviates to the left. Left facial droop noted  Motor: RUE 4/5 proximal and dista. LUE 3/5 with increased tone Able to withdraw BLE to stimuli, less briskly on the left. Uncooperative with lifting his legs.   Sensory: light touch intact throughout, bilaterally Deep Tendon Reflexes: 2+ right bicep and brachioradialis, 3+ left bicep, 2+ achilles. 3+ right patella, 2+ left patella.  Negative hoffman's  sign bilaterally. Plantars: Right: downgoing  Left: upgoing Cerebellar: Normal finger-to-nose on right side, no ataxia on right.  Unable to perform FNF on the left. Uncooperative for HTS Gait: deferred   Lab Results: Basic Metabolic Panel: Recent Labs  Lab 10/09/18 1315 10/09/18 1455  NA 139  --   K 4.3  --   CL 101  --   CO2 23  --   GLUCOSE 96  --   BUN 34*  --   CREATININE 0.99  --   CALCIUM 8.3*  --   MG  --  1.9    CBC: Recent Labs  Lab 10/09/18 1315 10/10/18 0313 10/10/18 0702 10/10/18 1036 10/11/18 0604  WBC 9.6 7.5  7.0 7.6 5.4  NEUTROABS 6.6  --   --   --   --   HGB 10.3* 7.8* 8.0* 9.7* 8.3*  HCT 31.1* 23.0* 24.0* 28.4* 24.4*  MCV 100.6* 98.3 99.2 100.0 100.4*  PLT 100* 83* 89* 108* 99*    Lipid Panel: Recent Labs  Lab 10/10/18 0702  CHOL 107  TRIG 69  HDL 21*  CHOLHDL 5.1  VLDL 14  LDLCALC 72    CBG: Recent Labs  Lab 10/12/18 1713  GLUCAP 67*    Imaging: [place] Abdomen Complete  Result Date: 10/12/2018 CLINICAL DATA:  Cirrhosis. EXAM: ABDOMEN ULTRASOUND COMPLETE COMPARISON:  None. FINDINGS: Gallbladder: Numerous small gallstones are noted in the gallbladder with associated acoustic shadowing. The largest calculus measures 8 mm. Diffusely thickened gallbladder wall but no pericholecystic fluid and negative sonographic Murphy sign. Common bile duct: Diameter: 2.8 mm Liver: Very irregular/nodular liver contour with heterogeneous echogenicity consistent with cirrhosis. No focal hepatic lesions or intrahepatic biliary dilatation. Portal vein is patent on color Doppler imaging with normal direction of blood flow towards the liver. IVC: Normal caliber. Pancreas: Poorly visualized. Spleen: Normal size.  No focal lesions. Right Kidney: Length: 9.5 x 5.9 x 5.6 cm. Normal renal cortical thickness and echogenicity without focal lesions or hydronephrosis. Left Kidney: Length: 8.4 x 3.7 x 3.9 cm. Normal renal cortical thickness and echogenicity without focal lesions  or hydronephrosis. Abdominal aorta: Poorly visualized. Other findings: No ascites. IMPRESSION: 1. Cirrhotic changes involving the liver but no focal hepatic lesion is identified. 2. Gallstones and diffuse gallbladder wall thickening but no pericholecystic fluid or sonographic Murphy sign. 3. No intra or extrahepatic biliary dilatation. 4. Poor visualization of the pancreas and aorta. 5. No splenomegaly or ascites. Electronically Signed   By: Rudie MeyerP.  Gallerani M.D.   On: 10/12/2018 04:28   STAT CT head:  1. 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 ASPECTS is 8. 2. No associated hemorrhage or mass effect. 3. Underlying advanced chronic small vessel disease.  Valentina LucksJessica Williams, MSN, NP-C [facility] [phone] 10/12/2018, 5:28 PM  Assessment: 58 y.o. male with PMH HTN and CHF who [facility] s/p fall. Code stroke was called for neuro decline. 1. 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 ASPECTS is 8. No associated hemorrhage or mass effect. Underlying advanced chronic small vessel disease. 2. Not a TPA candidate given unknown LKW and potential GI bleed. His left sided UMN findings are most consistent with chronicity given increased tone. The right basal ganglia infarction on CT, which appears old on my review of the images, is felt most likely to be the etiology for the left sided UMN findings.  3. Stroke Risk Factors - hypertension  4. Acute decline in AMS associated with hypoglycemia. Rapid improvement with IV glucose. 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   I have seen and examined the patient. I have formulated the assessment and plan Electronically signed: Dr.  Caryl PinaEric Solita Macadam

## 2018-10-12 NOTE — Progress Notes (Signed)
Went to check on pt- per Nurse tech he was refusing vital signs and getting back in bed.    On assessment - noted pt had a noticeable facial droop and drooling on left side.   Tried to do a neuro assessment, but pt was very slow in responding.   Would not allow me to get back in bed, to do vital signs. He was not alert to person (would not respond) and refused to believe he was in hospital.  Called Rapid response and MD to evaluate.    MD asked me to call code stroke.

## 2018-10-13 ENCOUNTER — Inpatient Hospital Stay (HOSPITAL_COMMUNITY): Payer: Medicaid Other

## 2018-10-13 ENCOUNTER — Encounter (HOSPITAL_COMMUNITY)
Admission: EM | Disposition: A | Discharge status: Skilled Nursing Facility | Payer: Self-pay | Source: Home / Self Care | Attending: Internal Medicine

## 2018-10-13 DIAGNOSIS — I6330 Cerebral infarction due to thrombosis of unspecified cerebral artery: Secondary | ICD-10-CM

## 2018-10-13 DIAGNOSIS — I619 Nontraumatic intracerebral hemorrhage, unspecified: Secondary | ICD-10-CM

## 2018-10-13 DIAGNOSIS — I609 Nontraumatic subarachnoid hemorrhage, unspecified: Secondary | ICD-10-CM

## 2018-10-13 DIAGNOSIS — K7460 Unspecified cirrhosis of liver: Secondary | ICD-10-CM

## 2018-10-13 DIAGNOSIS — R195 Other fecal abnormalities: Secondary | ICD-10-CM

## 2018-10-13 DIAGNOSIS — I6340 Cerebral infarction due to embolism of unspecified cerebral artery: Secondary | ICD-10-CM

## 2018-10-13 LAB — CBC
HCT: 27.1 % — ABNORMAL LOW (ref 39.0–52.0)
Hemoglobin: 9 g/dL — ABNORMAL LOW (ref 13.0–17.0)
MCH: 33.3 pg (ref 26.0–34.0)
MCHC: 33.2 g/dL (ref 30.0–36.0)
MCV: 100.4 fL — ABNORMAL HIGH (ref 80.0–100.0)
Platelets: 121 10*3/uL — ABNORMAL LOW (ref 150–400)
RBC: 2.7 MIL/uL — ABNORMAL LOW (ref 4.22–5.81)
RDW: 13.1 % (ref 11.5–15.5)
WBC: 6.9 10*3/uL (ref 4.0–10.5)
nRBC: 0.3 % — ABNORMAL HIGH (ref 0.0–0.2)

## 2018-10-13 LAB — BASIC METABOLIC PANEL
Anion gap: 5 (ref 5–15)
BUN: 8 mg/dL (ref 6–20)
CO2: 23 mmol/L (ref 22–32)
Calcium: 7.8 mg/dL — ABNORMAL LOW (ref 8.9–10.3)
Chloride: 115 mmol/L — ABNORMAL HIGH (ref 98–111)
Creatinine, Ser: 0.78 mg/dL (ref 0.61–1.24)
GFR calc Af Amer: >60 mL/min (ref >60)
GFR calc non Af Amer: >60 mL/min (ref >60)
Glucose, Bld: 85 mg/dL (ref 70–99)
POTASSIUM: 3.7 mmol/L (ref 3.5–5.1)
Sodium: 143 mmol/L (ref 135–145)

## 2018-10-13 LAB — GLUCOSE, CAPILLARY
GLUCOSE-CAPILLARY: 149 mg/dL — AB (ref 70–99)
Glucose-Capillary: 114 mg/dL — ABNORMAL HIGH (ref 70–99)
Glucose-Capillary: 52 mg/dL — ABNORMAL LOW (ref 70–99)
Glucose-Capillary: 66 mg/dL — ABNORMAL LOW (ref 70–99)
Glucose-Capillary: 73 mg/dL (ref 70–99)
Glucose-Capillary: 75 mg/dL (ref 70–99)
Glucose-Capillary: 77 mg/dL (ref 70–99)
Glucose-Capillary: 85 mg/dL (ref 70–99)
Glucose-Capillary: 98 mg/dL (ref 70–99)

## 2018-10-13 LAB — FOLATE RBC
Folate, Hemolysate: 287 ng/mL
Folate, RBC: 1311 ng/mL (ref >498)
Hematocrit: 21.9 % — ABNORMAL LOW (ref 37.5–51.0)

## 2018-10-13 LAB — PROTIME-INR
INR: 1.33
Prothrombin Time: 16.3 seconds — ABNORMAL HIGH (ref 11.4–15.2)

## 2018-10-13 LAB — HEMATOLOGY COMMENTS:

## 2018-10-13 LAB — HEMOGLOBIN A1C
Hgb A1c MFr Bld: 5 % (ref 4.8–5.6)
MEAN PLASMA GLUCOSE: 96.8 mg/dL

## 2018-10-13 LAB — HEPATIC FUNCTION PANEL
ALK PHOS: 63 U/L (ref 38–126)
ALT: 63 U/L — ABNORMAL HIGH (ref 0–44)
AST: 77 U/L — ABNORMAL HIGH (ref 15–41)
Albumin: 2.2 g/dL — ABNORMAL LOW (ref 3.5–5.0)
BILIRUBIN TOTAL: 1.5 mg/dL — AB (ref 0.3–1.2)
Bilirubin, Direct: 0.5 mg/dL — ABNORMAL HIGH (ref 0.0–0.2)
Indirect Bilirubin: 1 mg/dL — ABNORMAL HIGH (ref 0.3–0.9)
Total Protein: 5.8 g/dL — ABNORMAL LOW (ref 6.5–8.1)

## 2018-10-13 LAB — AMMONIA: Ammonia: 46 umol/L — ABNORMAL HIGH (ref 9–35)

## 2018-10-13 SURGERY — ESOPHAGOGASTRODUODENOSCOPY (EGD) WITH PROPOFOL
Anesthesia: Monitor Anesthesia Care

## 2018-10-13 MED ORDER — DEXTROSE 50 % IV SOLN
INTRAVENOUS | Status: AC
  Filled 2018-10-13: qty 50

## 2018-10-13 MED ORDER — IOPAMIDOL (ISOVUE-370) INJECTION 76%
75.0000 mL | Freq: Once | INTRAVENOUS | Status: AC | PRN
  Administered 2018-10-13: 75 mL via INTRAVENOUS

## 2018-10-13 MED ORDER — SODIUM CHLORIDE 0.9 % IV SOLN: INTRAVENOUS | Status: DC

## 2018-10-13 MED ORDER — HEPARIN SODIUM (PORCINE) 5000 UNIT/ML IJ SOLN
5000.0000 [IU] | Freq: Three times a day (TID) | INTRAMUSCULAR | Status: DC
  Administered 2018-10-13 – 2018-10-27 (×32): 5000 [IU] via SUBCUTANEOUS
  Filled 2018-10-13 (×39): qty 1

## 2018-10-13 MED ORDER — IOPAMIDOL (ISOVUE-370) INJECTION 76%
INTRAVENOUS | Status: AC
  Filled 2018-10-13: qty 100

## 2018-10-13 MED ORDER — LORAZEPAM 2 MG/ML IJ SOLN
1.0000 mg | Freq: Once | INTRAMUSCULAR | Status: AC
  Administered 2018-10-13: 1 mg via INTRAVENOUS

## 2018-10-13 MED ORDER — DEXTROSE 50 % IV SOLN
12.5000 g | INTRAVENOUS | Status: AC
  Administered 2018-10-13: 12.5 g via INTRAVENOUS

## 2018-10-13 MED ORDER — DEXTROSE-NACL 5-0.9 % IV SOLN
INTRAVENOUS | Status: DC
  Administered 2018-10-13 – 2018-10-16 (×7): via INTRAVENOUS

## 2018-10-13 NOTE — Progress Notes (Signed)
Patient arrived to the floor alert and oriented X 1 Disoriented to time, place and situation. Denies pain. Placed on tele # 21 NSR Skin clean dry and in tact. He was a little agitated when he arrived constantly trying to get out of bed  All questions and concern addressed bed the lowest position with bed alarm set. Call light in reach nurse will continue to monitor.

## 2018-10-13 NOTE — Progress Notes (Signed)
EEG Completed; Results Pending  

## 2018-10-13 NOTE — Progress Notes (Signed)
Due to patient's confusion we were unable to get consent signed for patient's procedure and the patient was unable to begin bowel prep.  Spoke with on call at [facility] who said procedures would be rescheduled pending patient's condition.

## 2018-10-13 NOTE — Progress Notes (Signed)
OT Cancellation Note  Patient Details Name: Jacob Bass MRN: [id number] DOB: 07/17/1961   Cancelled Treatment:    Reason Eval/Treat Not Completed: Medical issues which prohibited therapy(Pt with new onset stroke 2/9.  HOLD today). Will await updated orders when pt appropriate for OT evaluation  Galen ManilaSpencer, Elwood Bazinet Jeanette 10/13/2018, 12:25 PM

## 2018-10-13 NOTE — Progress Notes (Signed)
PT Cancellation Note  Patient Details Name: Jacob Bass MRN: [id number] DOB: March 18, 1961   Cancelled Treatment:    Reason Eval/Treat Not Completed: Medical issues which prohibited therapy(Pt with new onset stroke 2/9.  HOLD today.  )MD:  Please update PT when appropriate to resume therapy.  Will await updated activity orders. Thanks.      Berline Lopes 10/13/2018, 11:07 AM  [facility] Pager:  [phone]  Office:  [phone]

## 2018-10-13 NOTE — Procedures (Addendum)
History: 58 year old male being evaluated for lethargy  Sedation: None  Technique: This is a 21 channel routine scalp EEG performed at the bedside with bipolar and monopolar montages arranged in accordance to the international 10/20 system of electrode placement. One channel was dedicated to EKG recording.    Background: There is a well-formed and well sustained posterior dominant rhythm that does not achieve a frequency higher than 7 Hz.  In addition there is mild generalized irregular delta and theta intrusion into the background.  No epileptiform discharges were seen.  There is anterior shifting of the posterior dominant rhythm with drowsiness, but sleep is not recorded.  Photic stimulation: Physiologic driving is not performed  EEG Abnormalities: 1) mild generalized irregular slow activity 2) slow PDR  Clinical Interpretation: This EEG is consistent with a mild generalized nonspecific cerebral dysfunction (encephalopathy).  There was no seizure or seizure predisposition recorded on this study. Please note that lack of epileptiform activity on EEG does not preclude the possibility of epilepsy.   Ritta Slot, MD [facility] [phone]  If 7pm- 7am, please page neurology on call as listed in AMION.

## 2018-10-13 NOTE — Progress Notes (Addendum)
STROKE TEAM PROGRESS NOTE   SUBJECTIVE (INTERVAL HISTORY) His RN is at the bedside. Pt is drowsy sleepy but arousable with repetitive stimulation. Able to tell me his first name and [place] and the month. Still has left facial droop and left UE weakness. No LE weakness on painful stimulation. Glucose on the low end, put on D5NS. Will check CMP, ammonia and PT/INR.    OBJECTIVE Temp:  [98 F (36.7 C)-98.2 F (36.8 C)] 98 F (36.7 C) (02/10 0700) Pulse Rate:  [54-82] 54 (02/10 1223) Cardiac Rhythm: Normal sinus rhythm (02/10 0751) Resp:  [16-19] 19 (02/10 1223) BP: (150-160)/(64-82) 150/72 (02/10 1223) SpO2:  [95 %-98 %] 95 % (02/10 1223) Weight:  [62.1 kg] 62.1 kg (02/10 0700)  Recent Labs  Lab 10/12/18 2014 10/13/18 0035 10/13/18 0326 10/13/18 0800 10/13/18 1131  GLUCAP 94 75 73 85 77   Recent Labs  Lab 10/09/18 1315 10/09/18 1455  NA 139  --   K 4.3  --   CL 101  --   CO2 23  --   GLUCOSE 96  --   BUN 34*  --   CREATININE 0.99  --   CALCIUM 8.3*  --   MG  --  1.9   Recent Labs  Lab 10/09/18 1315 10/11/18 0604  AST 133* 95*  ALT 76* 63*  ALKPHOS 76 65  BILITOT 2.0* 1.2  PROT 6.7 5.4*  ALBUMIN 2.5* 2.0*   Recent Labs  Lab 10/09/18 1315 10/10/18 0313 10/10/18 0702 10/10/18 1036 10/11/18 0604 10/13/18 0517  WBC 9.6 7.5 7.0 7.6 5.4 6.9  NEUTROABS 6.6  --   --   --   --   --   HGB 10.3* 7.8* 8.0* 9.7* 8.3* 9.0*  HCT 31.1* 23.0* 24.0* 28.4* 24.4* 27.1*  MCV 100.6* 98.3 99.2 100.0 100.4* 100.4*  PLT 100* 83* 89* 108* 99* 121*   Recent Labs  Lab 10/09/18 1725 10/09/18 1947 10/09/18 2356 10/10/18 0313 10/10/18 0520  TROPONINI 0.04* 0.05* 0.05* 0.04* 0.04*   Recent Labs    10/11/18 1242 10/13/18 1209  LABPROT 15.4* 16.3*  INR 1.23 1.33   No results for input(s): COLORURINE, LABSPEC, PHURINE, GLUCOSEU, HGBUR, BILIRUBINUR, KETONESUR, PROTEINUR, UROBILINOGEN, NITRITE, LEUKOCYTESUR in the last 72 hours.  Invalid input(s): APPERANCEUR      Component Value Date/Time   CHOL 107 10/10/2018 0702   TRIG 69 10/10/2018 0702   HDL 21 (L) 10/10/2018 0702   CHOLHDL 5.1 10/10/2018 0702   VLDL 14 10/10/2018 0702   LDLCALC 72 10/10/2018 0702   Lab Results  Component Value Date   HGBA1C 5.0 10/13/2018      Component Value Date/Time   LABOPIA NONE DETECTED 10/09/2018 1454   COCAINSCRNUR NONE DETECTED 10/09/2018 1454   LABBENZ NONE DETECTED 10/09/2018 1454   AMPHETMU NONE DETECTED 10/09/2018 1454   THCU NONE DETECTED 10/09/2018 1454   LABBARB NONE DETECTED 10/09/2018 1454    No results for input(s): ETH in the last 168 hours.  I have personally reviewed the radiological images below and agree with the radiology interpretations.  Dg Chest 2 View  Result Date: 10/09/2018 CLINICAL DATA:  Chest pain, fall today.  History of CHF. EXAM: CHEST - 2 VIEW COMPARISON:  Chest radiograph May 17, 2016 FINDINGS: Cardiac silhouette is upper limits of normal size. Mild pulmonary vascular congestion without pleural effusion or focal consolidation. Biapical pleural thickening. No pneumothorax. Upper thoracic dextroscoliosis. IMPRESSION: Borderline cardiomegaly and mild pulmonary vascular congestion. Electronically Signed   By: Pernell Dupre  Bloomer M.D.   On: 10/09/2018 15:33   Mr Brain Wo Contrast  Result Date: 10/13/2018 CLINICAL DATA:  58 y/o M; altered level of consciousness, stroke for follow-up. EXAM: MRI HEAD WITHOUT CONTRAST TECHNIQUE: Axial DWI, coronal DWI, sagittal T1, axial T2 blade sequences were acquired. The patient was unable to continue and additional sequences were not acquired. COMPARISON:  10/12/2018 CT head FINDINGS: 13 mm focus of reduced diffusion within the right external capsule and corona radiata. Several additional punctate foci of reduced diffusion are present within the lentiform nuclei bilaterally. Findings are consistent with acute/early subacute infarctions. 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  Large confluent nonspecific T2 hyperintensities on the low B value sequence in subcortical and periventricular white matter are compatible with advanced chronic microvascular ischemic changes. Moderate volume loss of the brain. Volume loss of the brain. No gross mass effect, hydrocephalus, or herniation. Extensive motion degradation of sagittal T1 and axial T2 weighted sequences. IMPRESSION: 1. Limited motion degraded study. 2. 13 mm acute/early subacute infarction within right external capsule and corona radiata. Several additional punctate acute/early subacute infarcts are present in bilateral basal ganglia. 3. 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 Advanced chronic microvascular ischemic changes and moderate volume loss of the brain. Electronically Signed   By: Mitzi HansenLance  Furusawa-Stratton M.D.   On: 10/13/2018 03:27   [place] Abdomen Complete  Result Date: 10/12/2018 CLINICAL DATA:  Cirrhosis. EXAM: ABDOMEN ULTRASOUND COMPLETE COMPARISON:  None. FINDINGS: Gallbladder: Numerous small gallstones are noted in the gallbladder with associated acoustic shadowing. The largest calculus measures 8 mm. Diffusely thickened gallbladder wall but no pericholecystic fluid and negative sonographic Murphy sign. Common bile duct: Diameter: 2.8 mm Liver: Very irregular/nodular liver contour with heterogeneous echogenicity consistent with cirrhosis. No focal hepatic lesions or intrahepatic biliary dilatation. Portal vein is patent on color Doppler imaging with normal direction of blood flow towards the liver. IVC: Normal caliber. Pancreas: Poorly visualized. Spleen: Normal size.  No focal lesions. Right Kidney: Length: 9.5 x 5.9 x 5.6 cm. Normal renal cortical thickness and echogenicity without focal lesions or hydronephrosis. Left Kidney: Length: 8.4 x 3.7 x 3.9 cm. Normal renal cortical thickness and echogenicity without focal lesions or hydronephrosis. Abdominal aorta: Poorly visualized. Other findings: No  ascites. IMPRESSION: 1. Cirrhotic changes involving the liver but no focal hepatic lesion is identified. 2. Gallstones and diffuse gallbladder wall thickening but no pericholecystic fluid or sonographic Murphy sign. 3. No intra or extrahepatic biliary dilatation. 4. Poor visualization of the pancreas and aorta. 5. No splenomegaly or ascites. Electronically Signed   By: Rudie MeyerP.  Gallerani M.D.   On: 10/12/2018 04:28    Ct Head Code Stroke Wo Contrast  Result Date: 10/12/2018 CLINICAL DATA:  Code stroke. 58 year old male with left facial droop and left arm weakness. EXAM: CT HEAD WITHOUT CONTRAST TECHNIQUE: Contiguous axial images were obtained from the base of the skull through the vertex without intravenous contrast. COMPARISON:  Head CT 10/18/2017. FINDINGS: Brain: Extensive bilateral chronic cerebral white matter hypodensity. Multifocal lacunar infarcts in the right deep white matter and basal ganglia. Several of these are stable and chronic, but confluent hypodensity in the right basal ganglia on series 3, image 18 is new since 2019, as is subcortical white matter hypodensity anteriorly on image 23. No associated hemorrhage or mass effect. No superimposed acute cortically based infarct identified. Stable ventricle size and configuration. No midline shift, mass effect, or evidence of intracranial mass lesion. Vascular: Calcified atherosclerosis at the skull base. No suspicious intracranial vascular hyperdensity.  Skull: Negative. Sinuses/Orbits: Visualized paranasal sinuses and mastoids are stable and well pneumatized. Other: Negative orbits. Visualized scalp soft tissues are within normal limits. ASPECTS [facility] Stroke Program Early CT Score) - Ganglionic level infarction (caudate, lentiform nuclei, internal capsule, insula, M1-M3 cortex): 5 (abnormal caudate and lentiform) - Supraganglionic infarction (M4-M6 cortex): 3 Total score (0-10 with 10 being normal): 8. IMPRESSION: 1. Age indeterminate right basal  ganglia infarct, new since 2019. ASPECTS is 8. 2. No associated hemorrhage or mass effect. 3. Underlying advanced chronic small vessel disease. 4. These results were communicated to Dr. Otelia Limes at 5:38 pmon 2/9/2020by text page via the [facility] messaging system. Electronically Signed   By: Odessa Fleming M.D.   On: 10/12/2018 17:38     PHYSICAL EXAM  Temp:  [98 F (36.7 C)-98.2 F (36.8 C)] 98 F (36.7 C) (02/10 0700) Pulse Rate:  [54-82] 54 (02/10 1223) Resp:  [16-19] 19 (02/10 1223) BP: (150-160)/(64-82) 150/72 (02/10 1223) SpO2:  [95 %-98 %] 95 % (02/10 1223) Weight:  [62.1 kg] 62.1 kg (02/10 0700)  General - Well nourished, well developed, very drowsy sleepy.  Ophthalmologic - fundi not visualized due to noncooperation.  Cardiovascular - Regular rate and rhythm.  Neuro - drowsy sleepy but arousable with repetitive stimulation. Orientated to his first name, age, 43 and Ginette Otto, not orientated to current place, last name, year or situation. Did not following other commands, paucity of speech with severe dysarthria. Blinking to visual threat bilaterally. Tracking in both direction. PERRL, eye moving in both lateral direction. Left facial droop, tongue midline in mouth. LUE 2/5 at rest, but 3/5 with pain, RUE and BLE spontaneous movement against gravity. DTR 1+ and no babinski. Sensation, coordination and gait not tested.   ASSESSMENT/PLAN Jacob Bass is a 58 y.o. male with history of CHF, HTN, smoker, alcohol abuse, homeless admitted for s/p fall. Found to have neuro decline, slurry speech, left sided weakness, hypoglycemia. No tPA given due to outside window.    Metabolic encephalopathy  Poor intake and NPO since yesterday without IVF   Put on IVF @ 100  Constant hypoglycemia with poor po intake  Put on D5NS  Check CMP   Check Ammonia  Check PT/INR  Repeat UA  Alcohol withdraw less likely given no tachycardia no tremor - continue CIWA  Stroke:  Multifocal small  infarcts including right external capsule, right CR, left cerebellar, etiology unclear, suspect intracranial stenosis in the setting of hypotension and hypoglycemia.   Resultant left facial droop and left UE weakness  MRI  Right external capsule, right CR punctate, left cerebellar punctate acute infarcts. Subacute b/l BG small infarcts  CTA head and neck pending  2D Echo  EF 60-65%  LDL 72  HgbA1c 5.0  heparin for VTE prophylaxis  NPO  No antithrombotic prior to admission, now on No antithrombotic given current GIB and thrombocytopenia. Consider ASA once cleared from GI service and platelet count improves  Ongoing aggressive stroke risk factor management  Therapy recommendations:  Pending   Disposition:  Pending   hypoglycemia  Likely due to low po intake   On D5NS @ 100 now  HgbA1c 5.0 goal < 7.0  CBG monitoring  Elevated troponin  Cardiology on board  Troponin trending down  Demand ischemia  Heparin IV stopped  GIB with anemia  Hb 10.3->7.8->8.0->9.7->8.3->9.0  Stool occult blood positive  GI on board  Plan for EGD once stable  Hold off ASA for now until cleared by GI  Thrombocytopenia  Likely part of the reason for GIB  Platelet 103-100-83-89-108-99-121  Continue monitoring  Bleeding precautions  Hold off ASA until platelet count improves  Hypertension . Stable . Ggradually normalized within 3-5 days.  Long term BP goal normotensive  On lisinopril and metoprolol  On IVF  Hyperlipidemia  Home meds:  none   LDL 72, goal < 70  No statin for now given elevated LFT  Cirrhosis with alcohol abuse  US abdomen concerning for cirrhosis, no ascites  GI following  On CIWA protocol  Alcohol withdraw and seizure precautions  Limit alcohol use after discharge  Tobacco abuse  Current smoker  Smoking cessation counseling will be provided  Other Stroke Risk Factors  CHF  Other Active Problems  Homeless  Urinary  retention  Hep C   Hospital day # 2  I spent  35 minutes in total face-to-face time with the patient, more than 50% of which was spent in counseling and coordination of care, reviewing test results, images and medication, and discussing the diagnosis of acute AMS, stroke, GIB, thrombocytopenia, alcohol abuse, treatment plan and potential prognosis. This patient's care requiresreview of multiple databases, neurological assessment, discussion with family, other specialists and medical decision making of high complexity. I discussed with Dr. Dartha Lodgegbata.   Marvel PlanJindong Francesco Provencal, MD PhD Stroke Neurology 10/13/2018 12:51 PM    To contact Stroke Continuity provider, please refer to [email or web address]. After hours, contact General Neurology

## 2018-10-13 NOTE — Progress Notes (Signed)
Patient unable to hold still for MRI.  Patient was moving head, sitting up, grabbing at the head coil of scanner and not responding when we would try to speak and explain that we needed him to hold still.  After an hour with patient and 2 doses of Ativan, exam was ended early. Diffusion images acquired and sent through for Radiologist review.

## 2018-10-13 NOTE — Progress Notes (Signed)
Progress Note   Subjective  Chief Complaint: Macrocytic Anemia, Cirrhosis, Gallstones  Today, patient is unable to answer any of my questions.  He only mumbles.  Per nursing this is an acute change overnight, there is suspicion of a stroke the patient was unable to stay still for an MRI this morning.   Objective   Vital signs in last 24 hours: Temp:  [98 F (36.7 C)-98.5 F (36.9 C)] 98 F (36.7 C) (02/10 0700) Pulse Rate:  [56-82] 82 (02/10 0700) Resp:  [16-18] 18 (02/10 0700) BP: (128-160)/(64-82) 160/72 (02/10 0700) SpO2:  [97 %-100 %] 97 % (02/10 0700) Weight:  [62.1 kg] 62.1 kg (02/10 0700) Last BM Date: 10/11/18 General:  AA male in NAD Heart:  Regular rate and rhythm; no murmurs Lungs: Respirations even and unlabored, lungs CTA bilaterally Abdomen:  Soft, nontender and nondistended. Normal bowel sounds. Extremities:  Without edema. Psych:   Mumbles, unable to answer questions  Intake/Output from previous day: 02/09 0701 - 02/10 0700 In: 840 [P.O.:840] Out: 400 [Urine:400]  Lab Results: Recent Labs    10/10/18 1036 10/11/18 0604 10/13/18 0517  WBC 7.6 5.4 6.9  HGB 9.7* 8.3* 9.0*  HCT 28.4* 24.4* 27.1*  PLT 108* 99* 121*   LFT Recent Labs    10/11/18 0604  PROT 5.4*  ALBUMIN 2.0*  AST 95*  ALT 63*  ALKPHOS 65  BILITOT 1.2  BILIDIR 0.5*  IBILI 0.7   PT/INR Recent Labs    10/11/18 1242  LABPROT 15.4*  INR 1.23    Studies/Results: Mr Brain Wo Contrast  Result Date: 10/13/2018 CLINICAL DATA:  58 y/o M; altered level of consciousness, stroke for follow-up. EXAM: MRI HEAD WITHOUT CONTRAST TECHNIQUE: Axial DWI, coronal DWI, sagittal T1, axial T2 blade sequences were acquired. The patient was unable to continue and additional sequences were not acquired. COMPARISON:  10/12/2018 CT head FINDINGS: 13 mm focus of reduced diffusion within the right external capsule and corona radiata. Several additional punctate foci of reduced diffusion are present  within the lentiform nuclei bilaterally. Findings are consistent with acute/early subacute infarctions. 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 Large confluent nonspecific T2 hyperintensities on the low B value sequence in subcortical and periventricular white matter are compatible with advanced chronic microvascular ischemic changes. Moderate volume loss of the brain. Volume loss of the brain. No gross mass effect, hydrocephalus, or herniation. Extensive motion degradation of sagittal T1 and axial T2 weighted sequences. IMPRESSION: 1. Limited motion degraded study. 2. 13 mm acute/early subacute infarction within right external capsule and corona radiata. Several additional punctate acute/early subacute infarcts are present in bilateral basal ganglia. 3. 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 Advanced chronic microvascular ischemic changes and moderate volume loss of the brain. Electronically Signed   By: Mitzi Hansen M.D.   On: 10/13/2018 03:27   US Abdomen Complete  Result Date: 10/12/2018 CLINICAL DATA:  Cirrhosis. EXAM: ABDOMEN ULTRASOUND COMPLETE COMPARISON:  None. FINDINGS: Gallbladder: Numerous small gallstones are noted in the gallbladder with associated acoustic shadowing. The largest calculus measures 8 mm. Diffusely thickened gallbladder wall but no pericholecystic fluid and negative sonographic Murphy sign. Common bile duct: Diameter: 2.8 mm Liver: Very irregular/nodular liver contour with heterogeneous echogenicity consistent with cirrhosis. No focal hepatic lesions or intrahepatic biliary dilatation. Portal vein is patent on color Doppler imaging with normal direction of blood flow towards the liver. IVC: Normal caliber. Pancreas: Poorly visualized. Spleen: Normal size.  No focal lesions. Right Kidney: Length: 9.5 x  5.9 x 5.6 cm. Normal renal cortical thickness and echogenicity without focal lesions or hydronephrosis. Left Kidney: Length: 8.4 x  3.7 x 3.9 cm. Normal renal cortical thickness and echogenicity without focal lesions or hydronephrosis. Abdominal aorta: Poorly visualized. Other findings: No ascites. IMPRESSION: 1. Cirrhotic changes involving the liver but no focal hepatic lesion is identified. 2. Gallstones and diffuse gallbladder wall thickening but no pericholecystic fluid or sonographic Murphy sign. 3. No intra or extrahepatic biliary dilatation. 4. Poor visualization of the pancreas and aorta. 5. No splenomegaly or ascites. Electronically Signed   By: Rudie MeyerP.  Gallerani M.D.   On: 10/12/2018 04:28   Ct Head Code Stroke Wo Contrast  Result Date: 10/12/2018 CLINICAL DATA:  Code stroke. 58 year old male with left facial droop and left arm weakness. EXAM: CT HEAD WITHOUT CONTRAST TECHNIQUE: Contiguous axial images were obtained from the base of the skull through the vertex without intravenous contrast. COMPARISON:  Head CT 10/18/2017. FINDINGS: Brain: Extensive bilateral chronic cerebral white matter hypodensity. Multifocal lacunar infarcts in the right deep white matter and basal ganglia. Several of these are stable and chronic, but confluent hypodensity in the right basal ganglia on series 3, image 18 is new since 2019, as is subcortical white matter hypodensity anteriorly on image 23. No associated hemorrhage or mass effect. No superimposed acute cortically based infarct identified. Stable ventricle size and configuration. No midline shift, mass effect, or evidence of intracranial mass lesion. Vascular: Calcified atherosclerosis at the skull base. No suspicious intracranial vascular hyperdensity. Skull: Negative. Sinuses/Orbits: Visualized paranasal sinuses and mastoids are stable and well pneumatized. Other: Negative orbits. Visualized scalp soft tissues are within normal limits. ASPECTS [facility] Stroke Program Early CT Score) - Ganglionic level infarction (caudate, lentiform nuclei, internal capsule, insula, M1-M3 cortex): 5 (abnormal caudate  and lentiform) - Supraganglionic infarction (M4-M6 cortex): 3 Total score (0-10 with 10 being normal): 8. IMPRESSION: 1. 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 ASPECTS is 8. 2. No associated hemorrhage or mass effect. 3. Underlying advanced chronic small vessel disease. 4. These results were communicated to Dr. Otelia LimesLindzen at 5:38 pmon 2/9/2020by text page via the [facility] system. Electronically Signed   By: Odessa FlemingH  Hall M.D.   On: 10/12/2018 17:38    Assessment / Plan:   Assessment: 1.  Macrocytic anemia, heme positive stool: No active bleeding, hemoglobin stable overnight at 9 this morning 2.  Cirrhosis: Confirmed on ultrasound, no ascites, very likely alcohol induced-hep C antibody positive AFP and RNA pending 3.  Gallstones: Gallbladder wall thickening, incidental finding, no symptoms 4.  Homeless  Plan: 1.  Patient has had an acute change in mental status overnight.  Unable to undergo MRI of the brain this morning, was unable to lay still. 2.  Patient still needs EGD and colonoscopy, but we will have to wait until further work-up of his mental status change. 3.  Please await any further recommendations Dr. Adela LankArmbruster later today  Thanks for kind consultation, we will continue to follow.    LOS: 2 days   Unk LightningJennifer Lynne Judythe Postema  10/13/2018, 10:26 AM

## 2018-10-13 NOTE — Progress Notes (Signed)
Patient unable to answer screening questions. Spoke with Dr Harrie Jeans 210a 10/13/18 who reviewed prior imaging and cleared patient for MRI.

## 2018-10-13 NOTE — Progress Notes (Signed)
Patient CBG 52, Nurse treated with Dextrose 50% 12.5 mg, Current CBG 114 MD notify Nurse will continue to monitor

## 2018-10-13 NOTE — Progress Notes (Signed)
Progress Note    Jacob Bass  NLG:[id number] DOB: June 01, 1961  DOA: 10/09/2018 PCP: Patient, No Pcp Per    Brief Narrative:   Chief complaint: Fall  Medical records reviewed and are as summarized below:  Jacob Bass is an 58 y.o. male with past medical history significant for hypertension, tobacco abuse, and homelessness; who presented after slipping and falling in the rain.    10/12/2018: On presentation, troponin was noted to be minimally elevated.  Cardiology team was consulted, and the patient has been cleared by the cardiology team.  GI team was consulted for anemia.  Patient will undergo EGD and colonoscopy tomorrow.  We will also consult PT OT.  10/13/2018: MRI of the brain revealed acute/subacute infarct.  Patient is more lethargic today.  Will transfer patient to the stroke unit.  Will work patient up for other causes of encephalopathy.  Discussed case with neurology, Dr. Roda Shutters.  Neurology input is highly appreciated.  Will start patient on IV fluids.  GI input is also appreciated.  Colonoscopy and EGD are on hold for now.  Assessment/Plan:   Principal Problem:   Anemia, macrocytic Active Problems:   Chest pain   Hepatitis C antibody positive in blood   EKG abnormalities   Homelessness   Blood pressure elevated without history of HTN   Thrombocytopenia (HCC)   Cerebral thrombosis with cerebral infarction   Cerebral embolism with cerebral infarction   Subarachnoid hemorrhage   Intracerebral hemorrhage   Heme positive stool   Cirrhosis of liver without ascites (HCC)  1.  Macrocytic anemia: Acute.  Patient's initial hemoglobin noted be 10.3, but on recheck this a.m. hemoglobin 7.8 with elevated BUN to suggest possible upper GI bleed.  Patient was noted to have a positive stool guaiac, but repeat hemoglobin and hematocrit noted to trend upwards to 9.7 on hospital day 2.  Repeat CBC on hospital day 3 noted to be 8.3.  Vitamin B12 low normal at 313.  Bleeding could have been  related with heparin drip patient was initially started on for elevated troponins, but the heparin drip has been since discontinued. -Folate is 1311. -Protonix daily(previous QT noted to be borderline) -EKG done on 10/12/2018 revealed QTC and interval of 487 ms.  -GI is following patient. -Patient has liver cirrhosis that can explain the macrocytosis.  Patient is also an alcoholic.  Alcohol use can raise MCV.  10/13/2018: EGD and colonoscopy are canceled as patient is lethargic today, and MRI of the brain done earlier revealed multiple acute infarcts.  2. NSTEMI/elevated troponin: Patient noted to have elevated troponin 0.05 ->0.05->.0.04, and abnormal EKG.  He denied any complaints of chest pain.  Echocardiogram showing EF of 60 to 65% with grade 2 diastolic dysfunction.  LDL was noted to be 72.  He was initially placed on a heparin drip and cardiology was consulted with plan for stress test.  However, after hemoglobin noted to be low symptoms thought to be possibly related to demand ischemia and heparin drip was discontinued. -May benefit as outpatient referral to cardiology 10/12/2018: Cleared by cardiology for discharge.  Patient will follow cardiology team on discharge.  3.  Hepatitis C, Elevated liver enzymes: Acute.  On admission patient seen to have AST 133 with ALT 76 and total bilirubin 2.  Hepatitis panel was noted to be positive for hepatitis C antibodies.  There is concern for possibility of cirrhosis meld score would be approximately 9-11. -Follow-up quantitative hep C RNA  -Follow-up right upper  quadrant ultrasound  4.  Status post fall: Patient reports having a mechanical fall after slipping in the rain.  Physical therapy evaluated and recommended home health with physical therapy however patient is homeless. 10/12/2018: Await PT OT input.  Short-term rehabilitation.  5.  Essential hypertension: Blood pressure stable. -Continue lisinopril and metoprolol as tolerated 10/13/2018: Blood  pressure is 151/73 mmHg.  No treatment needed.  6.   Alcohol use: Patient denies being a daily alcohol drinker has not shown any significant signs of withdrawal. -CIWA protocol without scheduled Ativan -Continue folic acid and thiamine   7.  Thrombocytopenia: Chronic.  Suspect possible liver cirrhosis  8.  Tobacco abuse: Patient continues to smoke cigarettes. -Counseled on the need of cessation of tobacco use -Continue nicotine patch  9.  Cholelithiasis: No evidence of cholecystitis.  Continue to monitor.  10.  Multiple acute brain infarcts: -Antiplatelets on hold due to GI bleed. -Neurology is directing care. -Neurology clears him with GI team to decide when to start patient on antiplatelets, if at all, possible.  11.  Liver cirrhosis: -Can account for the thrombocytopenia and macrocytosis. -Avoid alcohol -Continue to monitor LFTs and INR.  12.  Acute encephalopathy: -Acute multiple CVAs noted. -Patient is volume depleted. -Intermittent episodes of hypoglycemia noted. Essentially, encephalopathy is likely multifactorial.  Overall, prognosis is guarded.   Body mass index is 24.25 kg/m.   Family Communication/Anticipated D/C date and plan/Code Status   DVT prophylaxis: SCD Code Status: Full Code.  Family Communication: No family present at bedside Disposition Plan: This will depend on hospital course.   Medical Consultants:    Cardiology  Gastroenterology  Neurology   Anti-Infectives:    None  Subjective:  No history from patient. Patient is lethargic.  Objective:    Vitals:   10/12/18 2335 10/13/18 0700 10/13/18 1223 10/13/18 1257  BP: (!) 152/64 (!) 160/72 (!) 150/72 (!) 151/73  Pulse: 71 82 (!) 54 60  Resp: 16 18 19 18   Temp: 98.2 F (36.8 C) 98 F (36.7 C)  97.9 F (36.6 C)  TempSrc: Oral Tympanic  Oral  SpO2: 98% 97% 95% 100%  Weight:  62.1 kg    Height:        Intake/Output Summary (Last 24 hours) at 10/13/2018 1539 Last data  filed at 10/13/2018 0000 Gross per 24 hour  Intake 240 ml  Output -  Net 240 ml   Filed Weights   10/11/18 0558 10/12/18 0521 10/13/18 0700  Weight: 64 kg 63.5 kg 62.1 kg    Exam: Constitutional: Lethargic. Eyes: Pallor.  No jaundice. ENMT: Dry mucous membranes.  Neck: normal, supple, no masses, no thyromegaly Respiratory: clear to auscultation.   Cardiovascular: S1-S2.   Abdomen: Obese, soft and nontender.  Organs are not palpable.   Musculoskeletal: Limited movement of the left upper extremity.   Neurologic: Lethargic.  Data Reviewed:   I have personally reviewed following labs and imaging studies:  Labs: Labs show the following:   Basic Metabolic Panel: Recent Labs  Lab 10/09/18 1315 10/09/18 1455 10/13/18 1209  NA 139  --  143  K 4.3  --  3.7  CL 101  --  115*  CO2 23  --  23  GLUCOSE 96  --  85  BUN 34*  --  8  CREATININE 0.99  --  0.78  CALCIUM 8.3*  --  7.8*  MG  --  1.9  --    GFR Estimated Creatinine Clearance: 80 mL/min (by C-G formula based on  SCr of 0.78 mg/dL). Liver Function Tests: Recent Labs  Lab 10/09/18 1315 10/11/18 0604 10/13/18 1209  AST 133* 95* 77*  ALT 76* 63* 63*  ALKPHOS 76 65 63  BILITOT 2.0* 1.2 1.5*  PROT 6.7 5.4* 5.8*  ALBUMIN 2.5* 2.0* 2.2*   Recent Labs  Lab 10/09/18 1315  LIPASE 30   Recent Labs  Lab 10/13/18 1209  AMMONIA 46*   Coagulation profile Recent Labs  Lab 10/11/18 1242 10/13/18 1209  INR 1.23 1.33    CBC: Recent Labs  Lab 10/09/18 1315 10/10/18 0313 10/10/18 0702 10/10/18 1036 10/11/18 0604 10/13/18 0517  WBC 9.6 7.5 7.0 7.6 5.4 6.9  NEUTROABS 6.6  --   --   --   --   --   HGB 10.3* 7.8* 8.0* 9.7* 8.3* 9.0*  HCT 31.1* 23.0* 24.0*  21.9* 28.4* 24.4* 27.1*  MCV 100.6* 98.3 99.2 100.0 100.4* 100.4*  PLT 100* 83* 89* 108* 99* 121*   Cardiac Enzymes: Recent Labs  Lab 10/09/18 1725 10/09/18 1947 10/09/18 2356 10/10/18 0313 10/10/18 0520  TROPONINI 0.04* 0.05* 0.05* 0.04* 0.04*    BNP (last 3 results) No results for input(s): PROBNP in the last 8760 hours. CBG: Recent Labs  Lab 10/12/18 2014 10/13/18 0035 10/13/18 0326 10/13/18 0800 10/13/18 1131  GLUCAP 94 75 73 85 77   D-Dimer: No results for input(s): DDIMER in the last 72 hours. Hgb A1c: Recent Labs    10/13/18 1209  HGBA1C 5.0   Lipid Profile: No results for input(s): CHOL, HDL, LDLCALC, TRIG, CHOLHDL, LDLDIRECT in the last 72 hours. Thyroid function studies: No results for input(s): TSH, T4TOTAL, T3FREE, THYROIDAB in the last 72 hours.  Invalid input(s): FREET3 Anemia work up: No results for input(s): VITAMINB12, FOLATE, FERRITIN, TIBC, IRON, RETICCTPCT in the last 72 hours. Sepsis Labs: Recent Labs  Lab 10/10/18 0702 10/10/18 1036 10/11/18 0604 10/13/18 0517  WBC 7.0 7.6 5.4 6.9    Microbiology No results found for this or any previous visit (from the past 240 hour(s)).  Procedures and diagnostic studies:  Mr Brain Wo Contrast  Result Date: 10/13/2018 CLINICAL DATA:  58 y/o M; altered level of consciousness, stroke for follow-up. EXAM: MRI HEAD WITHOUT CONTRAST TECHNIQUE: Axial DWI, coronal DWI, sagittal T1, axial T2 blade sequences were acquired. The patient was unable to continue and additional sequences were not acquired. COMPARISON:  10/12/2018 CT head FINDINGS: 13 mm focus of reduced diffusion within the right external capsule and corona radiata. Several additional punctate foci of reduced diffusion are present within the lentiform nuclei bilaterally. Findings are consistent with acute/early subacute infarctions. 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 Large confluent nonspecific T2 hyperintensities on the low B value sequence in subcortical and periventricular white matter are compatible with advanced chronic microvascular ischemic changes. Moderate volume loss of the brain. Volume loss of the brain. No gross mass effect, hydrocephalus, or herniation. Extensive  motion degradation of sagittal T1 and axial T2 weighted sequences. IMPRESSION: 1. Limited motion degraded study. 2. 13 mm acute/early subacute infarction within right external capsule and corona radiata. Several additional punctate acute/early subacute infarcts are present in bilateral basal ganglia. 3. 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 Advanced chronic microvascular ischemic changes and moderate volume loss of the brain. Electronically Signed   By: Mitzi HansenLance  Furusawa-Stratton M.D.   On: 10/13/2018 03:27   [place] Abdomen Complete  Result Date: 10/12/2018 CLINICAL DATA:  Cirrhosis. EXAM: ABDOMEN ULTRASOUND COMPLETE COMPARISON:  None. FINDINGS: Gallbladder: Numerous small gallstones are  noted in the gallbladder with associated acoustic shadowing. The largest calculus measures 8 mm. Diffusely thickened gallbladder wall but no pericholecystic fluid and negative sonographic Murphy sign. Common bile duct: Diameter: 2.8 mm Liver: Very irregular/nodular liver contour with heterogeneous echogenicity consistent with cirrhosis. No focal hepatic lesions or intrahepatic biliary dilatation. Portal vein is patent on color Doppler imaging with normal direction of blood flow towards the liver. IVC: Normal caliber. Pancreas: Poorly visualized. Spleen: Normal size.  No focal lesions. Right Kidney: Length: 9.5 x 5.9 x 5.6 cm. Normal renal cortical thickness and echogenicity without focal lesions or hydronephrosis. Left Kidney: Length: 8.4 x 3.7 x 3.9 cm. Normal renal cortical thickness and echogenicity without focal lesions or hydronephrosis. Abdominal aorta: Poorly visualized. Other findings: No ascites. IMPRESSION: 1. Cirrhotic changes involving the liver but no focal hepatic lesion is identified. 2. Gallstones and diffuse gallbladder wall thickening but no pericholecystic fluid or sonographic Murphy sign. 3. No intra or extrahepatic biliary dilatation. 4. Poor visualization of the pancreas and aorta. 5. No  splenomegaly or ascites. Electronically Signed   By: Rudie MeyerP.  Gallerani M.D.   On: 10/12/2018 04:28   Ct Head Code Stroke Wo Contrast  Result Date: 10/12/2018 CLINICAL DATA:  Code stroke. 58 year old male with left facial droop and left arm weakness. EXAM: CT HEAD WITHOUT CONTRAST TECHNIQUE: Contiguous axial images were obtained from the base of the skull through the vertex without intravenous contrast. COMPARISON:  Head CT 10/18/2017. FINDINGS: Brain: Extensive bilateral chronic cerebral white matter hypodensity. Multifocal lacunar infarcts in the right deep white matter and basal ganglia. Several of these are stable and chronic, but confluent hypodensity in the right basal ganglia on series 3, image 18 is new since 2019, as is subcortical white matter hypodensity anteriorly on image 23. No associated hemorrhage or mass effect. No superimposed acute cortically based infarct identified. Stable ventricle size and configuration. No midline shift, mass effect, or evidence of intracranial mass lesion. Vascular: Calcified atherosclerosis at the skull base. No suspicious intracranial vascular hyperdensity. Skull: Negative. Sinuses/Orbits: Visualized paranasal sinuses and mastoids are stable and well pneumatized. Other: Negative orbits. Visualized scalp soft tissues are within normal limits. ASPECTS [facility]([place] Stroke Program Early CT Score) - Ganglionic level infarction (caudate, lentiform nuclei, internal capsule, insula, M1-M3 cortex): 5 (abnormal caudate and lentiform) - Supraganglionic infarction (M4-M6 cortex): 3 Total score (0-10 with 10 being normal): 8. IMPRESSION: 1. 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 ASPECTS is 8. 2. No associated hemorrhage or mass effect. 3. Underlying advanced chronic small vessel disease. 4. These results were communicated to Dr. Otelia LimesLindzen at 5:38 pmon 2/9/2020by text page via the [facility] messaging system. Electronically Signed   By: Odessa FlemingH  Hall M.D.   On: 10/12/2018 17:38     Medications:   . folic acid  1 mg Oral Daily  . haloperidol lactate  5 mg Intravenous Once  . heparin injection (subcutaneous)  5,000 Units Subcutaneous Q8H  . lisinopril  10 mg Oral Daily  . metoprolol tartrate  25 mg Oral BID  . multivitamin with minerals  1 tablet Oral Daily  . pantoprazole  40 mg Oral Daily  . polyethylene glycol  4,000 mL Oral Once  . thiamine  100 mg Oral Daily   Continuous Infusions: . dextrose 5 % and 0.9% NaCl 100 mL/hr at 10/13/18 1227   None   LOS: 2 days   Barnetta ChapelSylvester I Leslie Jester  [facility]   *Please refer to [email or web address], password [email or web address] to get updated schedule on who  will round on this patient, as hospitalists switch teams weekly. If 7PM-7AM, please contact night-coverage at [email or web address], password [email or web address] for any overnight needs.

## 2018-10-14 ENCOUNTER — Inpatient Hospital Stay (HOSPITAL_COMMUNITY): Payer: Medicaid Other

## 2018-10-14 DIAGNOSIS — Z590 Homelessness: Secondary | ICD-10-CM

## 2018-10-14 LAB — GLUCOSE, CAPILLARY
GLUCOSE-CAPILLARY: 97 mg/dL (ref 70–99)
Glucose-Capillary: 106 mg/dL — ABNORMAL HIGH (ref 70–99)
Glucose-Capillary: 107 mg/dL — ABNORMAL HIGH (ref 70–99)
Glucose-Capillary: 123 mg/dL — ABNORMAL HIGH (ref 70–99)
Glucose-Capillary: 131 mg/dL — ABNORMAL HIGH (ref 70–99)
Glucose-Capillary: 152 mg/dL — ABNORMAL HIGH (ref 70–99)

## 2018-10-14 LAB — BASIC METABOLIC PANEL
Anion gap: 6 (ref 5–15)
BUN: 7 mg/dL (ref 6–20)
CO2: 24 mmol/L (ref 22–32)
Calcium: 7.9 mg/dL — ABNORMAL LOW (ref 8.9–10.3)
Chloride: 114 mmol/L — ABNORMAL HIGH (ref 98–111)
Creatinine, Ser: 0.85 mg/dL (ref 0.61–1.24)
GFR calc Af Amer: >60 mL/min (ref >60)
GFR calc non Af Amer: >60 mL/min (ref >60)
Glucose, Bld: 106 mg/dL — ABNORMAL HIGH (ref 70–99)
Potassium: 3.6 mmol/L (ref 3.5–5.1)
Sodium: 144 mmol/L (ref 135–145)

## 2018-10-14 LAB — PROCALCITONIN: Procalcitonin: <0.1 ng/mL

## 2018-10-14 LAB — URINALYSIS, COMPLETE (UACMP) WITH MICROSCOPIC
Bilirubin Urine: NEGATIVE
GLUCOSE, UA: NEGATIVE mg/dL
Hgb urine dipstick: NEGATIVE
Ketones, ur: NEGATIVE mg/dL
LEUKOCYTES UA: NEGATIVE
Nitrite: NEGATIVE
PROTEIN: NEGATIVE mg/dL
Specific Gravity, Urine: >1.046 — ABNORMAL HIGH (ref 1.005–1.030)
pH: 6 (ref 5.0–8.0)

## 2018-10-14 LAB — CBC
HCT: 29.2 % — ABNORMAL LOW (ref 39.0–52.0)
Hemoglobin: 9.9 g/dL — ABNORMAL LOW (ref 13.0–17.0)
MCH: 34.6 pg — ABNORMAL HIGH (ref 26.0–34.0)
MCHC: 33.9 g/dL (ref 30.0–36.0)
MCV: 102.1 fL — ABNORMAL HIGH (ref 80.0–100.0)
Platelets: 136 10*3/uL — ABNORMAL LOW (ref 150–400)
RBC: 2.86 MIL/uL — ABNORMAL LOW (ref 4.22–5.81)
RDW: 13.3 % (ref 11.5–15.5)
WBC: 7.9 10*3/uL (ref 4.0–10.5)
nRBC: 0 % (ref 0.0–0.2)

## 2018-10-14 LAB — AFP TUMOR MARKER: AFP, SERUM, TUMOR MARKER: 44.9 ng/mL — AB (ref 0.0–8.3)

## 2018-10-14 LAB — AMMONIA: Ammonia: 22 umol/L (ref 9–35)

## 2018-10-14 LAB — MAGNESIUM: Magnesium: 1.8 mg/dL (ref 1.7–2.4)

## 2018-10-14 MED ORDER — ASPIRIN 300 MG RE SUPP: 300.0000 mg | Freq: Once | RECTAL | Status: DC

## 2018-10-14 MED ORDER — ASPIRIN EC 81 MG PO TBEC: 81.0000 mg | DELAYED_RELEASE_TABLET | Freq: Every day | ORAL | Status: DC

## 2018-10-14 MED ORDER — JEVITY 1.2 CAL PO LIQD
1000.0000 mL | ORAL | Status: DC
  Administered 2018-10-14: 1000 mL
  Filled 2018-10-14 (×2): qty 1000

## 2018-10-14 MED ORDER — ASPIRIN 300 MG RE SUPP
300.0000 mg | Freq: Every day | RECTAL | Status: DC
  Administered 2018-10-14 – 2018-10-15 (×2): 300 mg via RECTAL
  Filled 2018-10-14 (×3): qty 1

## 2018-10-14 MED ORDER — ASPIRIN 300 MG RE SUPP: 300.0000 mg | Freq: Every day | RECTAL | Status: DC

## 2018-10-14 MED ORDER — IOPAMIDOL (ISOVUE-300) INJECTION 61%
INTRAVENOUS | Status: AC
  Administered 2018-10-14: 20 mL via GASTROSTOMY
  Filled 2018-10-14: qty 50

## 2018-10-14 MED ORDER — METOPROLOL TARTRATE 25 MG PO TABS
25.0000 mg | ORAL_TABLET | Freq: Two times a day (BID) | ORAL | Status: DC
  Administered 2018-10-15 – 2018-10-27 (×24): 25 mg
  Filled 2018-10-14 (×25): qty 1

## 2018-10-14 MED ORDER — LIDOCAINE VISCOUS HCL 2 % MT SOLN
OROMUCOSAL | Status: AC
  Administered 2018-10-14: 2 mL
  Filled 2018-10-14: qty 15

## 2018-10-14 NOTE — Progress Notes (Signed)
Progress Note   Subjective  Chief Complaint: Macrocytic anemia, cirrhosis, gallstones  Mental status is largely unchanged overnight.  Per nursing no further bleeding.   Objective   Vital signs in last 24 hours: Temp:  [97.5 F (36.4 C)-98.4 F (36.9 C)] 98.3 F (36.8 C) (02/11 1130) Pulse Rate:  [54-66] 66 (02/11 1130) Resp:  [16-20] 16 (02/11 1130) BP: (144-174)/(71-89) 145/75 (02/11 1130) SpO2:  [91 %-100 %] 91 % (02/11 1130) Last BM Date: 10/11/18 General: AA male in NAD Heart:  Regular rate and rhythm; no murmurs Lungs: Respirations even and unlabored, lungs CTA bilaterally Abdomen:  Soft, nontender and nondistended. Normal bowel sounds. Extremities:  Without edema. Psych: Continues to mumble, unable to answer questions  Intake/Output from previous day: 02/10 0701 - 02/11 0700 In: 0  Out: 560 [Urine:560]  Lab Results: Recent Labs    10/13/18 0517 10/14/18 0459  WBC 6.9 7.9  HGB 9.0* 9.9*  HCT 27.1* 29.2*  PLT 121* 136*   BMET Recent Labs    10/13/18 1209 10/14/18 0459  NA 143 144  K 3.7 3.6  CL 115* 114*  CO2 23 24  GLUCOSE 85 106*  BUN 8 7  CREATININE 0.78 0.85  CALCIUM 7.8* 7.9*   LFT Recent Labs    10/13/18 1209  PROT 5.8*  ALBUMIN 2.2*  AST 77*  ALT 63*  ALKPHOS 63  BILITOT 1.5*  BILIDIR 0.5*  IBILI 1.0*   PT/INR Recent Labs    10/11/18 1242 10/13/18 1209  LABPROT 15.4* 16.3*  INR 1.23 1.33    Studies/Results: Ct Angio Head W Or Wo Contrast  Result Date: 10/13/2018 CLINICAL DATA:  58 y/o  M; stroke for follow-up. EXAM: CT ANGIOGRAPHY HEAD AND NECK TECHNIQUE: Multidetector CT imaging of the head and neck was performed using the standard protocol during bolus administration of intravenous contrast. Multiplanar CT image reconstructions and MIPs were obtained to evaluate the vascular anatomy. Carotid stenosis measurements (when applicable) are obtained utilizing NASCET criteria, using the distal internal carotid diameter as the  denominator. CONTRAST:  75mL ISOVUE-370 IOPAMIDOL (ISOVUE-370) INJECTION 76% COMPARISON:  10/12/2018 CT head. 10/13/2018 MRI head. FINDINGS: CT HEAD FINDINGS Brain: Stable hypodensity within the right external capsule and corona radiata corresponding to infarction on prior MRI of the brain. Additional punctate infarcts are poorly visualized on CT. Stable chronic infarct in the right anterior basal ganglia. Stable advanced chronic microvascular ischemic changes of white matter and moderate volume loss of the brain. No new stroke, hemorrhage, mass effect, extra-axial collection, or herniation identified. Vascular: As below. Skull: Normal. Negative for fracture or focal lesion. Sinuses: Left mastoid tip opacification. Additional visible paranasal sinuses and the mastoid air cells are normally aerated. Orbits: No acute finding. Review of the MIP images confirms the above findings CTA NECK FINDINGS Aortic arch: Bovine very branching. Imaged portion shows no evidence of aneurysm or dissection. No significant stenosis of the major arch vessel origins. Moderate mixed plaque of the aortic arch. Right carotid system: No evidence of dissection, stenosis (50% or greater) or occlusion. Mixed plaque of the carotid bifurcation with mild less than 50% proximal ICA stenosis. Left carotid system: No evidence of dissection, stenosis (50% or greater) or occlusion. Vertebral arteries: Right vertebral artery segment of fibrofatty plaque with mild to moderate 50% stenosis just downstream to the vertebral body origin. No additional segment of hemodynamically significant stenosis, aneurysm, occlusion, or dissection. Skeleton: C3-4 and C4-5 grade 1 anterolisthesis. Mild spondylosis of the cervical spine. No high-grade bony  spinal canal stenosis. No acute osseous abnormality is evident. Other neck: Negative. Upper chest: Negative. Review of the MIP images confirms the above findings CTA HEAD FINDINGS Anterior circulation: Bilateral carotid  siphon calcified plaque. Mild less than 50% distal left cavernous ICA stenosis. Left A2 mild stenosis. No large vessel occlusion, aneurysm, vascular malformation, or additional segment of significant stenosis. Posterior circulation: No significant stenosis, proximal occlusion, aneurysm, or vascular malformation. Mild mid basilar stenosis. Mild bilateral P1 stenosis. Venous sinuses: As permitted by contrast timing, patent. Anatomic variants: Large right A1, large anterior communicating artery, diminutive left A1, normal variant. Associated asymmetric small left-sided left carotid system perfusing the left MCA distribution. Delayed phase: No abnormal intracranial enhancement. Review of the MIP images confirms the above findings IMPRESSION: CT head: 1. Stable hypodensity within the right external capsule and corona radiata corresponding to infarction on prior MRI of the brain. Additional punctate infarcts are poorly visualized on CT. No new acute intracranial abnormality identified. 2. Stable chronic infarct in the right anterior basal ganglia. Stable advanced chronic microvascular ischemic changes of white matter and moderate volume loss of the brain. CTA NECK:. 1. Patent carotid and vertebral arteries. No high-grade stenosis by NASCET criteria, dissection, or aneurysm. 2. Right proximal vertebral artery mild-to-moderate 50% stenosis with fibrofatty plaque. 3. Right proximal ICA mild less than 50% stenosis with mixed plaque. CTA HEAD:. 1. Patent Circle of Willis. No large vessel occlusion, aneurysm, or vascular malformation identified. 2. Intracranial atherosclerosis with multiple segments of mild stenosis in the anterior and posterior circulation. Electronically Signed   By: Mitzi Hansen M.D.   On: 10/13/2018 22:46   Dg Chest 2 View  Result Date: 10/13/2018 CLINICAL DATA:  Aspiration pneumonia EXAM: CHEST - 2 VIEW COMPARISON:  10/09/2018, 05/17/2006 FINDINGS: The heart size and mediastinal contours  are within normal limits. Aortic atherosclerosis. Both lungs are clear. The visualized skeletal structures are unremarkable. IMPRESSION: No active cardiopulmonary disease. Electronically Signed   By: Jasmine Pang M.D.   On: 10/13/2018 22:14   Ct Angio Neck W Or Wo Contrast  Result Date: 10/13/2018 CLINICAL DATA:  58 y/o  M; stroke for follow-up. EXAM: CT ANGIOGRAPHY HEAD AND NECK TECHNIQUE: Multidetector CT imaging of the head and neck was performed using the standard protocol during bolus administration of intravenous contrast. Multiplanar CT image reconstructions and MIPs were obtained to evaluate the vascular anatomy. Carotid stenosis measurements (when applicable) are obtained utilizing NASCET criteria, using the distal internal carotid diameter as the denominator. CONTRAST:  31mL ISOVUE-370 IOPAMIDOL (ISOVUE-370) INJECTION 76% COMPARISON:  10/12/2018 CT head. 10/13/2018 MRI head. FINDINGS: CT HEAD FINDINGS Brain: Stable hypodensity within the right external capsule and corona radiata corresponding to infarction on prior MRI of the brain. Additional punctate infarcts are poorly visualized on CT. Stable chronic infarct in the right anterior basal ganglia. Stable advanced chronic microvascular ischemic changes of white matter and moderate volume loss of the brain. No new stroke, hemorrhage, mass effect, extra-axial collection, or herniation identified. Vascular: As below. Skull: Normal. Negative for fracture or focal lesion. Sinuses: Left mastoid tip opacification. Additional visible paranasal sinuses and the mastoid air cells are normally aerated. Orbits: No acute finding. Review of the MIP images confirms the above findings CTA NECK FINDINGS Aortic arch: Bovine very branching. Imaged portion shows no evidence of aneurysm or dissection. No significant stenosis of the major arch vessel origins. Moderate mixed plaque of the aortic arch. Right carotid system: No evidence of dissection, stenosis (50% or greater)  or occlusion. Mixed plaque  of the carotid bifurcation with mild less than 50% proximal ICA stenosis. Left carotid system: No evidence of dissection, stenosis (50% or greater) or occlusion. Vertebral arteries: Right vertebral artery segment of fibrofatty plaque with mild to moderate 50% stenosis just downstream to the vertebral body origin. No additional segment of hemodynamically significant stenosis, aneurysm, occlusion, or dissection. Skeleton: C3-4 and C4-5 grade 1 anterolisthesis. Mild spondylosis of the cervical spine. No high-grade bony spinal canal stenosis. No acute osseous abnormality is evident. Other neck: Negative. Upper chest: Negative. Review of the MIP images confirms the above findings CTA HEAD FINDINGS Anterior circulation: Bilateral carotid siphon calcified plaque. Mild less than 50% distal left cavernous ICA stenosis. Left A2 mild stenosis. No large vessel occlusion, aneurysm, vascular malformation, or additional segment of significant stenosis. Posterior circulation: No significant stenosis, proximal occlusion, aneurysm, or vascular malformation. Mild mid basilar stenosis. Mild bilateral P1 stenosis. Venous sinuses: As permitted by contrast timing, patent. Anatomic variants: Large right A1, large anterior communicating artery, diminutive left A1, normal variant. Associated asymmetric small left-sided left carotid system perfusing the left MCA distribution. Delayed phase: No abnormal intracranial enhancement. Review of the MIP images confirms the above findings IMPRESSION: CT head: 1. Stable hypodensity within the right external capsule and corona radiata corresponding to infarction on prior MRI of the brain. Additional punctate infarcts are poorly visualized on CT. No new acute intracranial abnormality identified. 2. Stable chronic infarct in the right anterior basal ganglia. Stable advanced chronic microvascular ischemic changes of white matter and moderate volume loss of the brain. CTA NECK:.  1. Patent carotid and vertebral arteries. No high-grade stenosis by NASCET criteria, dissection, or aneurysm. 2. Right proximal vertebral artery mild-to-moderate 50% stenosis with fibrofatty plaque. 3. Right proximal ICA mild less than 50% stenosis with mixed plaque. CTA HEAD:. 1. Patent Circle of Willis. No large vessel occlusion, aneurysm, or vascular malformation identified. 2. Intracranial atherosclerosis with multiple segments of mild stenosis in the anterior and posterior circulation. Electronically Signed   By: Mitzi Hansen M.D.   On: 10/13/2018 22:46   Mr Brain Wo Contrast  Result Date: 10/13/2018 CLINICAL DATA:  58 y/o M; altered level of consciousness, stroke for follow-up. EXAM: MRI HEAD WITHOUT CONTRAST TECHNIQUE: Axial DWI, coronal DWI, sagittal T1, axial T2 blade sequences were acquired. The patient was unable to continue and additional sequences were not acquired. COMPARISON:  10/12/2018 CT head FINDINGS: 13 mm focus of reduced diffusion within the right external capsule and corona radiata. Several additional punctate foci of reduced diffusion are present within the lentiform nuclei bilaterally. Findings are consistent with acute/early subacute infarctions. 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 Large confluent nonspecific T2 hyperintensities on the low B value sequence in subcortical and periventricular white matter are compatible with advanced chronic microvascular ischemic changes. Moderate volume loss of the brain. Volume loss of the brain. No gross mass effect, hydrocephalus, or herniation. Extensive motion degradation of sagittal T1 and axial T2 weighted sequences. IMPRESSION: 1. Limited motion degraded study. 2. 13 mm acute/early subacute infarction within right external capsule and corona radiata. Several additional punctate acute/early subacute infarcts are present in bilateral basal ganglia. 3. 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  Advanced chronic microvascular ischemic changes and moderate volume loss of the brain. Electronically Signed   By: Mitzi Hansen M.D.   On: 10/13/2018 03:27   Ct Head Code Stroke Wo Contrast  Result Date: 10/12/2018 CLINICAL DATA:  Code stroke. 58 year old male with left facial droop and left arm weakness. EXAM:  CT HEAD WITHOUT CONTRAST TECHNIQUE: Contiguous axial images were obtained from the base of the skull through the vertex without intravenous contrast. COMPARISON:  Head CT 10/18/2017. FINDINGS: Brain: Extensive bilateral chronic cerebral white matter hypodensity. Multifocal lacunar infarcts in the right deep white matter and basal ganglia. Several of these are stable and chronic, but confluent hypodensity in the right basal ganglia on series 3, image 18 is new since 2019, as is subcortical white matter hypodensity anteriorly on image 23. No associated hemorrhage or mass effect. No superimposed acute cortically based infarct identified. Stable ventricle size and configuration. No midline shift, mass effect, or evidence of intracranial mass lesion. Vascular: Calcified atherosclerosis at the skull base. No suspicious intracranial vascular hyperdensity. Skull: Negative. Sinuses/Orbits: Visualized paranasal sinuses and mastoids are stable and well pneumatized. Other: Negative orbits. Visualized scalp soft tissues are within normal limits. ASPECTS [facility]([place] Stroke Program Early CT Score) - Ganglionic level infarction (caudate, lentiform nuclei, internal capsule, insula, M1-M3 cortex): 5 (abnormal caudate and lentiform) - Supraganglionic infarction (M4-M6 cortex): 3 Total score (0-10 with 10 being normal): 8. IMPRESSION: 1. 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 ASPECTS is 8. 2. No associated hemorrhage or mass effect. 3. Underlying advanced chronic small vessel disease. 4. These results were communicated to Dr. Otelia LimesLindzen at 5:38 pmon 2/9/2020by text page via the [facility] messaging  system. Electronically Signed   By: Odessa FlemingH  Hall M.D.   On: 10/12/2018 17:38       Assessment / Plan:   Assessment: 1.  Microcytic anemia, heme positive stool: No active bleeding, hemoglobin is stable 2.  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  Gallstones: Gallbladder wall thickening, incidental finding, no symptoms 4.  Homeless 5.  CVA: Neurology following  Plan: 1.  No change in mental status overnight.  Neurology is following. 2.  Patient is unable to prep for colonoscopy, so we are waiting for further.  At this time we will likely sign off.  Please call if something changes during his hospital stay or if he has acute signs of GI bleed. 3.  Please awaiting final recommendations from Dr. Adela LankArmbruster later today  Thank you for your kind consultation.  We will sign off for now.    LOS: 3 days   Unk LightningJennifer Lynne Lenorris Karger  10/14/2018, 11:58 AM

## 2018-10-14 NOTE — Progress Notes (Signed)
PT Cancellation Note  Patient Details Name: Jacob Bass MRN: [id number] DOB: 11/02/1960   Cancelled Treatment:    Reason Eval/Treat Not Completed: Medical issues which prohibited therapy. PT spoke with nursing. No new orders that she is aware of. Patient very lethargic today and would not be appropriate for PT today. PT will continue to follow up and await new orders. Currently on HOLD due to new stroke 02/09.   Katina DungBarbara D. Hartnett-Rands, MS, PT [facility] [place] [phone]#[id number] 10/14/2018, 10:25 AM

## 2018-10-14 NOTE — Progress Notes (Signed)
OT Cancellation Note  Patient Details Name: Jacob Bass MRN: [id number] DOB: March 31, 1961   Cancelled Treatment:    Reason Eval/Treat Not Completed: Medical issues which prohibited therapy. Pt continues to be very lethargic today and would not be appropriate for therapeutic intervention. Pt currently remains on hold dur to new stroke 10/12/18.  Alen Bleacher, MS, OTR/L 10/14/2018, 10:41 AM

## 2018-10-14 NOTE — Progress Notes (Signed)
Brief Nutrition Note  Consult received for enteral/tube feeding initiation and management.  Adult Enteral Nutrition Protocol initiated. Full assessment to follow.  Admitting Dx: Elevated troponin [R79.89] Generalized weakness [R53.1] Fall, initial encounter [W19.XXXA]  Body mass index is 24.25 kg/m. Pt meets criteria for normal based on current BMI.  Labs:  Recent Labs  Lab 10/09/18 1315 10/09/18 1455 10/13/18 1209 10/14/18 0459  NA 139  --  143 144  K 4.3  --  3.7 3.6  CL 101  --  115* 114*  CO2 23  --  23 24  BUN 34*  --  8 7  CREATININE 0.99  --  0.78 0.85  CALCIUM 8.3*  --  7.8* 7.9*  MG  --  1.9  --  1.8  GLUCOSE 96  --  85 106*    Roslyn Smiling, MS, RD, LDN Pager # [phone] After hours/ weekend pager # [phone]

## 2018-10-14 NOTE — Progress Notes (Signed)
Patient voided 160 ml since 7pm,bladder scan showed 259 ml. patient currently NPO. Text paged C. Bodenheimer, orders received for in and out cath and to increase rate of IV fluids to 150 ml/hr. In and out done. 400 ml of amber urine noted.

## 2018-10-14 NOTE — Progress Notes (Signed)
Hypoglycemic Event  CBG: 66  Treatment: 12.5gof D50  Symptoms: none  Follow-up CBG: Time:2359 CBG Result:123  Possible Reasons for Event: patient to CT without IV fluids connected  Comments/MD notified:none    Jacob Bass A Romani Wilbon

## 2018-10-14 NOTE — Progress Notes (Signed)
Progress Note    Jacob Bass  ZOX:[id number] DOB: 1960-10-18  DOA: 10/09/2018 PCP: Patient, No Pcp Per    Brief Narrative:   Chief complaint: Fall  Medical records reviewed and are as summarized below:  Jacob Bass is an 58 y.o. male with past medical history significant for hypertension, CHF, tobacco abuse and homelessness.  Patient presented to the hospital after slipping and falling in the rain (as per prior documentation).  There is also documentation that the patient may have been found down on the street.  Patient is an alcoholic.  On presentation to the hospital, troponin was noted to be minimally elevated.  Cardiology team was consulted, and the patient has been cleared by the cardiology team.  GI team was consulted as patient was noted to have anemia.  Initial plan was to proceed with EGD and colonoscopy.  However, patient's mental status continued to deteriorate.  Patient was noted to have limited use of the left upper extremity, with associated facial droop.  Based on worsening symptoms, MRI of the brain was done.  MRI brain revealed 13 mm acute/early subacute infarction within right external capsule and corona radiata, several additional punctate acute/early subacute infarcts present bilateral basal ganglia, right anterior basal ganglia chronic hemorrhagic infarction and advanced chronic microvascular ischemic changes and moderate volume loss of the brain.  Neurology team was consulted.  Neurology is following patient.  GI team has advised that is okay to start antiplatelet, but patient will need to be monitored closely.  Hemoglobin has remained stable.  Patient is on PPI.  10/14/2018: We will consult dietitian for possible tube feeds.  Patient will need NG tube placed by IR team.  Patient's volume depletion is improving.  Minimal improvement noted in patient's lethargy.  Patient continues to be weak on the left side, worse left upper extremity.  Assessment/Plan:   Principal  Problem:   Anemia, macrocytic Active Problems:   Chest pain   Hepatitis C antibody positive in blood   EKG abnormalities   Homelessness   Blood pressure elevated without history of HTN   Thrombocytopenia (HCC)   Cerebral thrombosis with cerebral infarction   Cerebral embolism with cerebral infarction   Subarachnoid hemorrhage   Intracerebral hemorrhage   Heme positive stool   Cirrhosis of liver without ascites (HCC)  Acute encephalopathy: Likely multifactorial. Patient is volume depleted. Patient has acute external capsule and basal ganglia infarct. Patient is an alcoholic. Patient has liver cirrhosis with minimally elevated ammonia level (46). Patient's baseline is unknown to me. Recurrent hypoglycemia and noted Continue to manage underlying processes as documented above. Acute encephalopathy is improving.  Acute external capsule and basal ganglia infarct: Neurology is directing care. Aspirin p.o./PR NG tube placement and tube feeds. Supportive care Aspiration precautions Avoid temperature/fever PT OT consult when feasible Further management depend on hospital course.  Macrocytic anemia:  -Macrocytosis is likely multifactorial.   -Patient is an alcoholic, has liver disease and vitamin B12 is towards the low range of normal.  -GI team is following patient.  -Initial plan was to proceed with an EGD and colonoscopy, but patient became more lethargic, and the work-up revealed acute CVA.  Patient is also volume depleted.  -H/H has remained stable.  -GI team has okayed starting antiplatelet for the acute CVA.  -Folate is 1311 (normal range). -Protonix daily (previous QT noted to be borderline) -EKG done on 10/12/2018 revealed QTC and interval of 487 ms.   Elevated troponin:  Possibly type II  elevation.   Patient was seen by cardiology earlier.   No further cardiac work-up as per cardiology team.   Patient will follow with cardiology team on discharge.    Hepatitis  C/liver cirrhosis: Elevated liver enzymes Hep C viral load noted Follow-up with GI on discharge Avoid alcohol.  Alcohol abuse, likely continuous: On CIWA Protocol (as needed benzodiazepine) Counsel patient further when encephalopathy resolves significantly.    Status post fall:  This may have been related to the acute CVA/alcohol abuse.   PT OT when feasible.    Essential hypertension:  Blood pressure stable. -Continue lisinopril and metoprolol as tolerated Continue to optimize.  Thrombocytopenia:  Chronic.   Likely related to liver cirrhosis/disease.  Tobacco abuse: Patient continues to smoke cigarettes. -Counsel when patient's lethargy resolved significantly.  -Continue nicotine patch  Cholelithiasis:  No evidence of acute cholecystitis.   Continue to monitor.  Liver cirrhosis: -Can account for the thrombocytopenia and macrocytosis. -Avoid alcohol -Hep C viral load noted -Follow-up with PCP and GI team for treatment of hep C when patient is more stable -Continue to monitor LFTs and INR.  Volume depletion: Continue IV fluids. Manage expectantly.  Overall, prognosis is guarded.   Body mass index is 24.25 kg/m.   Family Communication/Anticipated D/C date and plan/Code Status   DVT prophylaxis: SCD Code Status: Full Code.  Family Communication: No family present at bedside Disposition Plan: This will depend on hospital course.   Medical Consultants:    Cardiology  Gastroenterology  Neurology   Anti-Infectives:    None  Subjective:  No history from patient. Patient is lethargic.  Objective:    Vitals:   10/13/18 1925 10/13/18 2349 10/14/18 0416 10/14/18 0832  BP: (!) 166/83 (!) 153/89 (!) 144/85 (!) 151/71  Pulse: (!) 57 (!) 58 64 61  Resp: 18 18 18 20   Temp: 98.2 F (36.8 C) 97.6 F (36.4 C) (!) 97.5 F (36.4 C) 98.4 F (36.9 C)  TempSrc: Axillary Oral Oral Oral  SpO2: 100%  96% 95%  Weight:      Height:        Intake/Output  Summary (Last 24 hours) at 10/14/2018 0921 Last data filed at 10/14/2018 0553 Gross per 24 hour  Intake 0 ml  Output 560 ml  Net -560 ml   Filed Weights   10/11/18 0558 10/12/18 0521 10/13/18 0700  Weight: 64 kg 63.5 kg 62.1 kg    Exam: Constitutional: Lethargic but improving. Eyes: Pallor.  No jaundice. ENMT: Dry mucous membranes.  Neck: normal, supple, no masses, no thyromegaly Respiratory: clear to auscultation.   Cardiovascular: S1-S2.   Abdomen: Obese, soft and nontender.  Organs are not palpable.   Musculoskeletal: Limited movement of the left upper extremity and to a lot lesser extent, left lower extremity.   Neurologic: Lethargic, with left-sided weakness worse left upper extremity.  Data Reviewed:   I have personally reviewed following labs and imaging studies:  Labs: Labs show the following:   Basic Metabolic Panel: Recent Labs  Lab 10/09/18 1315 10/09/18 1455 10/13/18 1209 10/14/18 0459  NA 139  --  143 144  K 4.3  --  3.7 3.6  CL 101  --  115* 114*  CO2 23  --  23 24  GLUCOSE 96  --  85 106*  BUN 34*  --  8 7  CREATININE 0.99  --  0.78 0.85  CALCIUM 8.3*  --  7.8* 7.9*  MG  --  1.9  --  1.8  GFR Estimated Creatinine Clearance: 75.3 mL/min (by C-G formula based on SCr of 0.85 mg/dL). Liver Function Tests: Recent Labs  Lab 10/09/18 1315 10/11/18 0604 10/13/18 1209  AST 133* 95* 77*  ALT 76* 63* 63*  ALKPHOS 76 65 63  BILITOT 2.0* 1.2 1.5*  PROT 6.7 5.4* 5.8*  ALBUMIN 2.5* 2.0* 2.2*   Recent Labs  Lab 10/09/18 1315  LIPASE 30   Recent Labs  Lab 10/13/18 1209  AMMONIA 46*   Coagulation profile Recent Labs  Lab 10/11/18 1242 10/13/18 1209  INR 1.23 1.33    CBC: Recent Labs  Lab 10/09/18 1315  10/10/18 0702 10/10/18 1036 10/11/18 0604 10/13/18 0517 10/14/18 0459  WBC 9.6   < > 7.0 7.6 5.4 6.9 7.9  NEUTROABS 6.6  --   --   --   --   --   --   HGB 10.3*   < > 8.0* 9.7* 8.3* 9.0* 9.9*  HCT 31.1*   < > 24.0*  21.9* 28.4*  24.4* 27.1* 29.2*  MCV 100.6*   < > 99.2 100.0 100.4* 100.4* 102.1*  PLT 100*   < > 89* 108* 99* 121* 136*   < > = values in this interval not displayed.   Cardiac Enzymes: Recent Labs  Lab 10/09/18 1725 10/09/18 1947 10/09/18 2356 10/10/18 0313 10/10/18 0520  TROPONINI 0.04* 0.05* 0.05* 0.04* 0.04*   BNP (last 3 results) No results for input(s): PROBNP in the last 8760 hours. CBG: Recent Labs  Lab 10/13/18 1956 10/13/18 2102 10/13/18 2333 10/13/18 2359 10/14/18 0427  GLUCAP 98 149* 66* 123* 106*   D-Dimer: No results for input(s): DDIMER in the last 72 hours. Hgb A1c: Recent Labs    10/13/18 1209  HGBA1C 5.0   Lipid Profile: No results for input(s): CHOL, HDL, LDLCALC, TRIG, CHOLHDL, LDLDIRECT in the last 72 hours. Thyroid function studies: No results for input(s): TSH, T4TOTAL, T3FREE, THYROIDAB in the last 72 hours.  Invalid input(s): FREET3 Anemia work up: No results for input(s): VITAMINB12, FOLATE, FERRITIN, TIBC, IRON, RETICCTPCT in the last 72 hours. Sepsis Labs: Recent Labs  Lab 10/10/18 1036 10/11/18 0604 10/13/18 0517 10/13/18 2334 10/14/18 0459  PROCALCITON  --   --   --  <0.10  --   WBC 7.6 5.4 6.9  --  7.9    Microbiology Recent Results (from the past 240 hour(s))  Culture, blood (routine x 2)     Status: None (Preliminary result)   Collection Time: 10/13/18  7:59 PM  Result Value Ref Range Status   Specimen Description BLOOD LEFT HAND  Final   Special Requests   Final    BOTTLES DRAWN AEROBIC AND ANAEROBIC Blood Culture adequate volume   Culture   Final    NO GROWTH < 12 HOURS Performed at [facility], [address] [place], [place] [address]    Report Status PENDING  Incomplete  Culture, blood (routine x 2)     Status: None (Preliminary result)   Collection Time: 10/13/18  7:59 PM  Result Value Ref Range Status   Specimen Description BLOOD RIGHT HAND  Final   Special Requests   Final    BOTTLES DRAWN AEROBIC ONLY Blood  Culture adequate volume   Culture   Final    NO GROWTH < 12 HOURS Performed at [facility], [address], [place], [place] [address]    Report Status PENDING  Incomplete    Procedures and diagnostic studies:  Ct Angio  Head W Or Wo Contrast  Result Date: 10/13/2018 CLINICAL DATA:  58 y/o  M; stroke for follow-up. EXAM: CT ANGIOGRAPHY HEAD AND NECK TECHNIQUE: Multidetector CT imaging of the head and neck was performed using the standard protocol during bolus administration of intravenous contrast. Multiplanar CT image reconstructions and MIPs were obtained to evaluate the vascular anatomy. Carotid stenosis measurements (when applicable) are obtained utilizing NASCET criteria, using the distal internal carotid diameter as the denominator. CONTRAST:  75mL ISOVUE-370 IOPAMIDOL (ISOVUE-370) INJECTION 76% COMPARISON:  10/12/2018 CT head. 10/13/2018 MRI head. FINDINGS: CT HEAD FINDINGS Brain: Stable hypodensity within the right external capsule and corona radiata corresponding to infarction on prior MRI of the brain. Additional punctate infarcts are poorly visualized on CT. Stable chronic infarct in the right anterior basal ganglia. Stable advanced chronic microvascular ischemic changes of white matter and moderate volume loss of the brain. No new stroke, hemorrhage, mass effect, extra-axial collection, or herniation identified. Vascular: As below. Skull: Normal. Negative for fracture or focal lesion. Sinuses: Left mastoid tip opacification. Additional visible paranasal sinuses and the mastoid air cells are normally aerated. Orbits: No acute finding. Review of the MIP images confirms the above findings CTA NECK FINDINGS Aortic arch: Bovine very branching. Imaged portion shows no evidence of aneurysm or dissection. No significant stenosis of the major arch vessel origins. Moderate mixed plaque of the aortic arch. Right carotid system: No evidence of dissection, stenosis (50% or greater) or occlusion.  Mixed plaque of the carotid bifurcation with mild less than 50% proximal ICA stenosis. Left carotid system: No evidence of dissection, stenosis (50% or greater) or occlusion. Vertebral arteries: Right vertebral artery segment of fibrofatty plaque with mild to moderate 50% stenosis just downstream to the vertebral body origin. No additional segment of hemodynamically significant stenosis, aneurysm, occlusion, or dissection. Skeleton: C3-4 and C4-5 grade 1 anterolisthesis. Mild spondylosis of the cervical spine. No high-grade bony spinal canal stenosis. No acute osseous abnormality is evident. Other neck: Negative. Upper chest: Negative. Review of the MIP images confirms the above findings CTA HEAD FINDINGS Anterior circulation: Bilateral carotid siphon calcified plaque. Mild less than 50% distal left cavernous ICA stenosis. Left A2 mild stenosis. No large vessel occlusion, aneurysm, vascular malformation, or additional segment of significant stenosis. Posterior circulation: No significant stenosis, proximal occlusion, aneurysm, or vascular malformation. Mild mid basilar stenosis. Mild bilateral P1 stenosis. Venous sinuses: As permitted by contrast timing, patent. Anatomic variants: Large right A1, large anterior communicating artery, diminutive left A1, normal variant. Associated asymmetric small left-sided left carotid system perfusing the left MCA distribution. Delayed phase: No abnormal intracranial enhancement. Review of the MIP images confirms the above findings IMPRESSION: CT head: 1. Stable hypodensity within the right external capsule and corona radiata corresponding to infarction on prior MRI of the brain. Additional punctate infarcts are poorly visualized on CT. No new acute intracranial abnormality identified. 2. Stable chronic infarct in the right anterior basal ganglia. Stable advanced chronic microvascular ischemic changes of white matter and moderate volume loss of the brain. CTA NECK:. 1. Patent  carotid and vertebral arteries. No high-grade stenosis by NASCET criteria, dissection, or aneurysm. 2. Right proximal vertebral artery mild-to-moderate 50% stenosis with fibrofatty plaque. 3. Right proximal ICA mild less than 50% stenosis with mixed plaque. CTA HEAD:. 1. Patent Circle of Willis. No large vessel occlusion, aneurysm, or vascular malformation identified. 2. Intracranial atherosclerosis with multiple segments of mild stenosis in the anterior and posterior circulation. Electronically Signed   By: Buzzy HanLance  Furusawa-Stratton M.D.  On: 10/13/2018 22:46   Dg Chest 2 View  Result Date: 10/13/2018 CLINICAL DATA:  Aspiration pneumonia EXAM: CHEST - 2 VIEW COMPARISON:  10/09/2018, 05/17/2006 FINDINGS: The heart size and mediastinal contours are within normal limits. Aortic atherosclerosis. Both lungs are clear. The visualized skeletal structures are unremarkable. IMPRESSION: No active cardiopulmonary disease. Electronically Signed   By: Jasmine Pang M.D.   On: 10/13/2018 22:14   Ct Angio Neck W Or Wo Contrast  Result Date: 10/13/2018 CLINICAL DATA:  58 y/o  M; stroke for follow-up. EXAM: CT ANGIOGRAPHY HEAD AND NECK TECHNIQUE: Multidetector CT imaging of the head and neck was performed using the standard protocol during bolus administration of intravenous contrast. Multiplanar CT image reconstructions and MIPs were obtained to evaluate the vascular anatomy. Carotid stenosis measurements (when applicable) are obtained utilizing NASCET criteria, using the distal internal carotid diameter as the denominator. CONTRAST:  75mL ISOVUE-370 IOPAMIDOL (ISOVUE-370) INJECTION 76% COMPARISON:  10/12/2018 CT head. 10/13/2018 MRI head. FINDINGS: CT HEAD FINDINGS Brain: Stable hypodensity within the right external capsule and corona radiata corresponding to infarction on prior MRI of the brain. Additional punctate infarcts are poorly visualized on CT. Stable chronic infarct in the right anterior basal ganglia. Stable  advanced chronic microvascular ischemic changes of white matter and moderate volume loss of the brain. No new stroke, hemorrhage, mass effect, extra-axial collection, or herniation identified. Vascular: As below. Skull: Normal. Negative for fracture or focal lesion. Sinuses: Left mastoid tip opacification. Additional visible paranasal sinuses and the mastoid air cells are normally aerated. Orbits: No acute finding. Review of the MIP images confirms the above findings CTA NECK FINDINGS Aortic arch: Bovine very branching. Imaged portion shows no evidence of aneurysm or dissection. No significant stenosis of the major arch vessel origins. Moderate mixed plaque of the aortic arch. Right carotid system: No evidence of dissection, stenosis (50% or greater) or occlusion. Mixed plaque of the carotid bifurcation with mild less than 50% proximal ICA stenosis. Left carotid system: No evidence of dissection, stenosis (50% or greater) or occlusion. Vertebral arteries: Right vertebral artery segment of fibrofatty plaque with mild to moderate 50% stenosis just downstream to the vertebral body origin. No additional segment of hemodynamically significant stenosis, aneurysm, occlusion, or dissection. Skeleton: C3-4 and C4-5 grade 1 anterolisthesis. Mild spondylosis of the cervical spine. No high-grade bony spinal canal stenosis. No acute osseous abnormality is evident. Other neck: Negative. Upper chest: Negative. Review of the MIP images confirms the above findings CTA HEAD FINDINGS Anterior circulation: Bilateral carotid siphon calcified plaque. Mild less than 50% distal left cavernous ICA stenosis. Left A2 mild stenosis. No large vessel occlusion, aneurysm, vascular malformation, or additional segment of significant stenosis. Posterior circulation: No significant stenosis, proximal occlusion, aneurysm, or vascular malformation. Mild mid basilar stenosis. Mild bilateral P1 stenosis. Venous sinuses: As permitted by contrast timing,  patent. Anatomic variants: Large right A1, large anterior communicating artery, diminutive left A1, normal variant. Associated asymmetric small left-sided left carotid system perfusing the left MCA distribution. Delayed phase: No abnormal intracranial enhancement. Review of the MIP images confirms the above findings IMPRESSION: CT head: 1. Stable hypodensity within the right external capsule and corona radiata corresponding to infarction on prior MRI of the brain. Additional punctate infarcts are poorly visualized on CT. No new acute intracranial abnormality identified. 2. Stable chronic infarct in the right anterior basal ganglia. Stable advanced chronic microvascular ischemic changes of white matter and moderate volume loss of the brain. CTA NECK:. 1. Patent carotid and vertebral arteries. No  high-grade stenosis by NASCET criteria, dissection, or aneurysm. 2. Right proximal vertebral artery mild-to-moderate 50% stenosis with fibrofatty plaque. 3. Right proximal ICA mild less than 50% stenosis with mixed plaque. CTA HEAD:. 1. Patent Circle of Willis. No large vessel occlusion, aneurysm, or vascular malformation identified. 2. Intracranial atherosclerosis with multiple segments of mild stenosis in the anterior and posterior circulation. Electronically Signed   By: Mitzi Hansen M.D.   On: 10/13/2018 22:46   Mr Brain Wo Contrast  Result Date: 10/13/2018 CLINICAL DATA:  58 y/o M; altered level of consciousness, stroke for follow-up. EXAM: MRI HEAD WITHOUT CONTRAST TECHNIQUE: Axial DWI, coronal DWI, sagittal T1, axial T2 blade sequences were acquired. The patient was unable to continue and additional sequences were not acquired. COMPARISON:  10/12/2018 CT head FINDINGS: 13 mm focus of reduced diffusion within the right external capsule and corona radiata. Several additional punctate foci of reduced diffusion are present within the lentiform nuclei bilaterally. Findings are consistent with acute/early  subacute infarctions. 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 Large confluent nonspecific T2 hyperintensities on the low B value sequence in subcortical and periventricular white matter are compatible with advanced chronic microvascular ischemic changes. Moderate volume loss of the brain. Volume loss of the brain. No gross mass effect, hydrocephalus, or herniation. Extensive motion degradation of sagittal T1 and axial T2 weighted sequences. IMPRESSION: 1. Limited motion degraded study. 2. 13 mm acute/early subacute infarction within right external capsule and corona radiata. Several additional punctate acute/early subacute infarcts are present in bilateral basal ganglia. 3. 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 Advanced chronic microvascular ischemic changes and moderate volume loss of the brain. Electronically Signed   By: Mitzi Hansen M.D.   On: 10/13/2018 03:27   Ct Head Code Stroke Wo Contrast  Result Date: 10/12/2018 CLINICAL DATA:  Code stroke. 58 year old male with left facial droop and left arm weakness. EXAM: CT HEAD WITHOUT CONTRAST TECHNIQUE: Contiguous axial images were obtained from the base of the skull through the vertex without intravenous contrast. COMPARISON:  Head CT 10/18/2017. FINDINGS: Brain: Extensive bilateral chronic cerebral white matter hypodensity. Multifocal lacunar infarcts in the right deep white matter and basal ganglia. Several of these are stable and chronic, but confluent hypodensity in the right basal ganglia on series 3, image 18 is new since 2019, as is subcortical white matter hypodensity anteriorly on image 23. No associated hemorrhage or mass effect. No superimposed acute cortically based infarct identified. Stable ventricle size and configuration. No midline shift, mass effect, or evidence of intracranial mass lesion. Vascular: Calcified atherosclerosis at the skull base. No suspicious intracranial vascular  hyperdensity. Skull: Negative. Sinuses/Orbits: Visualized paranasal sinuses and mastoids are stable and well pneumatized. Other: Negative orbits. Visualized scalp soft tissues are within normal limits. ASPECTS [facility] Stroke Program Early CT Score) - Ganglionic level infarction (caudate, lentiform nuclei, internal capsule, insula, M1-M3 cortex): 5 (abnormal caudate and lentiform) - Supraganglionic infarction (M4-M6 cortex): 3 Total score (0-10 with 10 being normal): 8. IMPRESSION: 1. 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 ASPECTS is 8. 2. No associated hemorrhage or mass effect. 3. Underlying advanced chronic small vessel disease. 4. These results were communicated to Dr. Otelia Limes at 5:38 pmon 2/9/2020by text page via the [facility] messaging system. Electronically Signed   By: Odessa Fleming M.D.   On: 10/12/2018 17:38    Medications:   . aspirin EC  81 mg Oral Daily  . folic acid  1 mg Oral Daily  . haloperidol lactate  5 mg Intravenous Once  . heparin injection (subcutaneous)  5,000 Units Subcutaneous Q8H  . lisinopril  10 mg Oral Daily  . metoprolol tartrate  25 mg Oral BID  . multivitamin with minerals  1 tablet Oral Daily  . pantoprazole  40 mg Oral Daily  . polyethylene glycol  4,000 mL Oral Once  . thiamine  100 mg Oral Daily   Continuous Infusions: . dextrose 5 % and 0.9% NaCl 150 mL/hr at 10/14/18 0552   None   LOS: 3 days   Barnetta ChapelSylvester I Ogbata  [facility]   *Please refer to [email or web address], password [email or web address] to get updated schedule on who will round on this patient, as hospitalists switch teams weekly. If 7PM-7AM, please contact night-coverage at [email or web address], password [email or web address] for any overnight needs.

## 2018-10-14 NOTE — Progress Notes (Signed)
STROKE TEAM PROGRESS NOTE   SUBJECTIVE (INTERVAL HISTORY) No family is at the bedside. Pt is still drowsy sleepy but arousable, still has left facial droop and left UE weakness. No LE weakness on painful stimulation. On D5 NS but still had episode of low glucose, received D50.     OBJECTIVE Temp:  [97.5 F (36.4 C)-98.4 F (36.9 C)] 98.3 F (36.8 C) (02/11 1130) Pulse Rate:  [54-66] 66 (02/11 1130) Cardiac Rhythm: Normal sinus rhythm (02/11 0743) Resp:  [16-20] 16 (02/11 1130) BP: (144-174)/(71-89) 145/75 (02/11 1130) SpO2:  [91 %-100 %] 91 % (02/11 1130)  Recent Labs  Lab 10/13/18 2333 10/13/18 2359 10/14/18 0427 10/14/18 0932 10/14/18 1110  GLUCAP 66* 123* 106* 107* 131*   Recent Labs  Lab 10/09/18 1315 10/09/18 1455 10/13/18 1209 10/14/18 0459  NA 139  --  143 144  K 4.3  --  3.7 3.6  CL 101  --  115* 114*  CO2 23  --  23 24  GLUCOSE 96  --  85 106*  BUN 34*  --  8 7  CREATININE 0.99  --  0.78 0.85  CALCIUM 8.3*  --  7.8* 7.9*  MG  --  1.9  --  1.8   Recent Labs  Lab 10/09/18 1315 10/11/18 0604 10/13/18 1209  AST 133* 95* 77*  ALT 76* 63* 63*  ALKPHOS 76 65 63  BILITOT 2.0* 1.2 1.5*  PROT 6.7 5.4* 5.8*  ALBUMIN 2.5* 2.0* 2.2*   Recent Labs  Lab 10/09/18 1315  10/10/18 0702 10/10/18 1036 10/11/18 0604 10/13/18 0517 10/14/18 0459  WBC 9.6   < > 7.0 7.6 5.4 6.9 7.9  NEUTROABS 6.6  --   --   --   --   --   --   HGB 10.3*   < > 8.0* 9.7* 8.3* 9.0* 9.9*  HCT 31.1*   < > 24.0*  21.9* 28.4* 24.4* 27.1* 29.2*  MCV 100.6*   < > 99.2 100.0 100.4* 100.4* 102.1*  PLT 100*   < > 89* 108* 99* 121* 136*   < > = values in this interval not displayed.   Recent Labs  Lab 10/09/18 1725 10/09/18 1947 10/09/18 2356 10/10/18 0313 10/10/18 0520  TROPONINI 0.04* 0.05* 0.05* 0.04* 0.04*   Recent Labs    10/11/18 1242 10/13/18 1209  LABPROT 15.4* 16.3*  INR 1.23 1.33   Recent Labs    10/14/18 0041  COLORURINE AMBER*  LABSPEC >1.046*  PHURINE 6.0   GLUCOSEU NEGATIVE  HGBUR NEGATIVE  BILIRUBINUR NEGATIVE  KETONESUR NEGATIVE  PROTEINUR NEGATIVE  NITRITE NEGATIVE  LEUKOCYTESUR NEGATIVE       Component Value Date/Time   CHOL 107 10/10/2018 0702   TRIG 69 10/10/2018 0702   HDL 21 (L) 10/10/2018 0702   CHOLHDL 5.1 10/10/2018 0702   VLDL 14 10/10/2018 0702   LDLCALC 72 10/10/2018 0702   Lab Results  Component Value Date   HGBA1C 5.0 10/13/2018      Component Value Date/Time   LABOPIA NONE DETECTED 10/09/2018 1454   COCAINSCRNUR NONE DETECTED 10/09/2018 1454   LABBENZ NONE DETECTED 10/09/2018 1454   AMPHETMU NONE DETECTED 10/09/2018 1454   THCU NONE DETECTED 10/09/2018 1454   LABBARB NONE DETECTED 10/09/2018 1454    No results for input(s): ETH in the last 168 hours.  I have personally reviewed the radiological images below and agree with the radiology interpretations.  Dg Chest 2 View  Result Date: 10/09/2018 CLINICAL DATA:  Chest  pain, fall today.  History of CHF. EXAM: CHEST - 2 VIEW COMPARISON:  Chest radiograph May 17, 2016 FINDINGS: Cardiac silhouette is upper limits of normal size. Mild pulmonary vascular congestion without pleural effusion or focal consolidation. Biapical pleural thickening. No pneumothorax. Upper thoracic dextroscoliosis. IMPRESSION: Borderline cardiomegaly and mild pulmonary vascular congestion. Electronically Signed   By: Awilda Metro M.D.   On: 10/09/2018 15:33   Mr Brain Wo Contrast  Result Date: 10/13/2018 CLINICAL DATA:  58 y/o M; altered level of consciousness, stroke for follow-up. EXAM: MRI HEAD WITHOUT CONTRAST TECHNIQUE: Axial DWI, coronal DWI, sagittal T1, axial T2 blade sequences were acquired. The patient was unable to continue and additional sequences were not acquired. COMPARISON:  10/12/2018 CT head FINDINGS: 13 mm focus of reduced diffusion within the right external capsule and corona radiata. Several additional punctate foci of reduced diffusion are present within the  lentiform nuclei bilaterally. Findings are consistent with acute/early subacute infarctions. 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 Large confluent nonspecific T2 hyperintensities on the low B value sequence in subcortical and periventricular white matter are compatible with advanced chronic microvascular ischemic changes. Moderate volume loss of the brain. Volume loss of the brain. No gross mass effect, hydrocephalus, or herniation. Extensive motion degradation of sagittal T1 and axial T2 weighted sequences. IMPRESSION: 1. Limited motion degraded study. 2. 13 mm acute/early subacute infarction within right external capsule and corona radiata. Several additional punctate acute/early subacute infarcts are present in bilateral basal ganglia. 3. 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 Advanced chronic microvascular ischemic changes and moderate volume loss of the brain. Electronically Signed   By: Mitzi Hansen M.D.   On: 10/13/2018 03:27   US Abdomen Complete  Result Date: 10/12/2018 CLINICAL DATA:  Cirrhosis. EXAM: ABDOMEN ULTRASOUND COMPLETE COMPARISON:  None. FINDINGS: Gallbladder: Numerous small gallstones are noted in the gallbladder with associated acoustic shadowing. The largest calculus measures 8 mm. Diffusely thickened gallbladder wall but no pericholecystic fluid and negative sonographic Murphy sign. Common bile duct: Diameter: 2.8 mm Liver: Very irregular/nodular liver contour with heterogeneous echogenicity consistent with cirrhosis. No focal hepatic lesions or intrahepatic biliary dilatation. Portal vein is patent on color Doppler imaging with normal direction of blood flow towards the liver. IVC: Normal caliber. Pancreas: Poorly visualized. Spleen: Normal size.  No focal lesions. Right Kidney: Length: 9.5 x 5.9 x 5.6 cm. Normal renal cortical thickness and echogenicity without focal lesions or hydronephrosis. Left Kidney: Length: 8.4 x 3.7 x 3.9  cm. Normal renal cortical thickness and echogenicity without focal lesions or hydronephrosis. Abdominal aorta: Poorly visualized. Other findings: No ascites. IMPRESSION: 1. Cirrhotic changes involving the liver but no focal hepatic lesion is identified. 2. Gallstones and diffuse gallbladder wall thickening but no pericholecystic fluid or sonographic Murphy sign. 3. No intra or extrahepatic biliary dilatation. 4. Poor visualization of the pancreas and aorta. 5. No splenomegaly or ascites. Electronically Signed   By: Rudie Meyer M.D.   On: 10/12/2018 04:28    Ct Head Code Stroke Wo Contrast  Result Date: 10/12/2018 CLINICAL DATA:  Code stroke. 58 year old male with left facial droop and left arm weakness. EXAM: CT HEAD WITHOUT CONTRAST TECHNIQUE: Contiguous axial images were obtained from the base of the skull through the vertex without intravenous contrast. COMPARISON:  Head CT 10/18/2017. FINDINGS: Brain: Extensive bilateral chronic cerebral white matter hypodensity. Multifocal lacunar infarcts in the right deep white matter and basal ganglia. Several of these are stable and chronic, but confluent hypodensity in the right basal  ganglia on series 3, image 18 is new since 2019, as is subcortical white matter hypodensity anteriorly on image 23. No associated hemorrhage or mass effect. No superimposed acute cortically based infarct identified. Stable ventricle size and configuration. No midline shift, mass effect, or evidence of intracranial mass lesion. Vascular: Calcified atherosclerosis at the skull base. No suspicious intracranial vascular hyperdensity. Skull: Negative. Sinuses/Orbits: Visualized paranasal sinuses and mastoids are stable and well pneumatized. Other: Negative orbits. Visualized scalp soft tissues are within normal limits. ASPECTS [facility] Stroke Program Early CT Score) - Ganglionic level infarction (caudate, lentiform nuclei, internal capsule, insula, M1-M3 cortex): 5 (abnormal caudate and  lentiform) - Supraganglionic infarction (M4-M6 cortex): 3 Total score (0-10 with 10 being normal): 8. IMPRESSION: 1. 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 ASPECTS is 8. 2. No associated hemorrhage or mass effect. 3. Underlying advanced chronic small vessel disease. 4. These results were communicated to Dr. Otelia Limes at 5:38 pmon 2/9/2020by text page via the [facility] messaging system. Electronically Signed   By: Odessa Fleming M.D.   On: 10/12/2018 17:38   Ct Angio Head W Or Wo Contrast  Result Date: 10/13/2018 CLINICAL DATA:  58 y/o  M; stroke for follow-up. EXAM: CT ANGIOGRAPHY HEAD AND NECK TECHNIQUE: Multidetector CT imaging of the head and neck was performed using the standard protocol during bolus administration of intravenous contrast. Multiplanar CT image reconstructions and MIPs were obtained to evaluate the vascular anatomy. Carotid stenosis measurements (when applicable) are obtained utilizing NASCET criteria, using the distal internal carotid diameter as the denominator. CONTRAST:  36mL ISOVUE-370 IOPAMIDOL (ISOVUE-370) INJECTION 76% COMPARISON:  10/12/2018 CT head. 10/13/2018 MRI head. FINDINGS: CT HEAD FINDINGS Brain: Stable hypodensity within the right external capsule and corona radiata corresponding to infarction on prior MRI of the brain. Additional punctate infarcts are poorly visualized on CT. Stable chronic infarct in the right anterior basal ganglia. Stable advanced chronic microvascular ischemic changes of white matter and moderate volume loss of the brain. No new stroke, hemorrhage, mass effect, extra-axial collection, or herniation identified. Vascular: As below. Skull: Normal. Negative for fracture or focal lesion. Sinuses: Left mastoid tip opacification. Additional visible paranasal sinuses and the mastoid air cells are normally aerated. Orbits: No acute finding. Review of the MIP images confirms the above findings CTA NECK FINDINGS Aortic arch: Bovine very branching.  Imaged portion shows no evidence of aneurysm or dissection. No significant stenosis of the major arch vessel origins. Moderate mixed plaque of the aortic arch. Right carotid system: No evidence of dissection, stenosis (50% or greater) or occlusion. Mixed plaque of the carotid bifurcation with mild less than 50% proximal ICA stenosis. Left carotid system: No evidence of dissection, stenosis (50% or greater) or occlusion. Vertebral arteries: Right vertebral artery segment of fibrofatty plaque with mild to moderate 50% stenosis just downstream to the vertebral body origin. No additional segment of hemodynamically significant stenosis, aneurysm, occlusion, or dissection. Skeleton: C3-4 and C4-5 grade 1 anterolisthesis. Mild spondylosis of the cervical spine. No high-grade bony spinal canal stenosis. No acute osseous abnormality is evident. Other neck: Negative. Upper chest: Negative. Review of the MIP images confirms the above findings CTA HEAD FINDINGS Anterior circulation: Bilateral carotid siphon calcified plaque. Mild less than 50% distal left cavernous ICA stenosis. Left A2 mild stenosis. No large vessel occlusion, aneurysm, vascular malformation, or additional segment of significant stenosis. Posterior circulation: No significant stenosis, proximal occlusion, aneurysm, or vascular malformation. Mild mid basilar stenosis. Mild bilateral P1 stenosis. Venous sinuses: As permitted by contrast  timing, patent. Anatomic variants: Large right A1, large anterior communicating artery, diminutive left A1, normal variant. Associated asymmetric small left-sided left carotid system perfusing the left MCA distribution. Delayed phase: No abnormal intracranial enhancement. Review of the MIP images confirms the above findings IMPRESSION: CT head: 1. Stable hypodensity within the right external capsule and corona radiata corresponding to infarction on prior MRI of the brain. Additional punctate infarcts are poorly visualized on CT.  No new acute intracranial abnormality identified. 2. Stable chronic infarct in the right anterior basal ganglia. Stable advanced chronic microvascular ischemic changes of white matter and moderate volume loss of the brain. CTA NECK:. 1. Patent carotid and vertebral arteries. No high-grade stenosis by NASCET criteria, dissection, or aneurysm. 2. Right proximal vertebral artery mild-to-moderate 50% stenosis with fibrofatty plaque. 3. Right proximal ICA mild less than 50% stenosis with mixed plaque. CTA HEAD:. 1. Patent Circle of Willis. No large vessel occlusion, aneurysm, or vascular malformation identified. 2. Intracranial atherosclerosis with multiple segments of mild stenosis in the anterior and posterior circulation. Electronically Signed   By: Mitzi HansenLance  Furusawa-Stratton M.D.   On: 10/13/2018 22:46   Dg Chest 2 View  Result Date: 10/13/2018 CLINICAL DATA:  Aspiration pneumonia EXAM: CHEST - 2 VIEW COMPARISON:  10/09/2018, 05/17/2006 FINDINGS: The heart size and mediastinal contours are within normal limits. Aortic atherosclerosis. Both lungs are clear. The visualized skeletal structures are unremarkable. IMPRESSION: No active cardiopulmonary disease. Electronically Signed   By: Jasmine PangKim  Fujinaga M.D.   On: 10/13/2018 22:14   Ct Angio Neck W Or Wo Contrast  Result Date: 10/13/2018 CLINICAL DATA:  58 y/o  M; stroke for follow-up. EXAM: CT ANGIOGRAPHY HEAD AND NECK TECHNIQUE: Multidetector CT imaging of the head and neck was performed using the standard protocol during bolus administration of intravenous contrast. Multiplanar CT image reconstructions and MIPs were obtained to evaluate the vascular anatomy. Carotid stenosis measurements (when applicable) are obtained utilizing NASCET criteria, using the distal internal carotid diameter as the denominator. CONTRAST:  75mL ISOVUE-370 IOPAMIDOL (ISOVUE-370) INJECTION 76% COMPARISON:  10/12/2018 CT head. 10/13/2018 MRI head. FINDINGS: CT HEAD FINDINGS Brain: Stable  hypodensity within the right external capsule and corona radiata corresponding to infarction on prior MRI of the brain. Additional punctate infarcts are poorly visualized on CT. Stable chronic infarct in the right anterior basal ganglia. Stable advanced chronic microvascular ischemic changes of white matter and moderate volume loss of the brain. No new stroke, hemorrhage, mass effect, extra-axial collection, or herniation identified. Vascular: As below. Skull: Normal. Negative for fracture or focal lesion. Sinuses: Left mastoid tip opacification. Additional visible paranasal sinuses and the mastoid air cells are normally aerated. Orbits: No acute finding. Review of the MIP images confirms the above findings CTA NECK FINDINGS Aortic arch: Bovine very branching. Imaged portion shows no evidence of aneurysm or dissection. No significant stenosis of the major arch vessel origins. Moderate mixed plaque of the aortic arch. Right carotid system: No evidence of dissection, stenosis (50% or greater) or occlusion. Mixed plaque of the carotid bifurcation with mild less than 50% proximal ICA stenosis. Left carotid system: No evidence of dissection, stenosis (50% or greater) or occlusion. Vertebral arteries: Right vertebral artery segment of fibrofatty plaque with mild to moderate 50% stenosis just downstream to the vertebral body origin. No additional segment of hemodynamically significant stenosis, aneurysm, occlusion, or dissection. Skeleton: C3-4 and C4-5 grade 1 anterolisthesis. Mild spondylosis of the cervical spine. No high-grade bony spinal canal stenosis. No acute osseous abnormality is evident. Other neck: Negative.  Upper chest: Negative. Review of the MIP images confirms the above findings CTA HEAD FINDINGS Anterior circulation: Bilateral carotid siphon calcified plaque. Mild less than 50% distal left cavernous ICA stenosis. Left A2 mild stenosis. No large vessel occlusion, aneurysm, vascular malformation, or  additional segment of significant stenosis. Posterior circulation: No significant stenosis, proximal occlusion, aneurysm, or vascular malformation. Mild mid basilar stenosis. Mild bilateral P1 stenosis. Venous sinuses: As permitted by contrast timing, patent. Anatomic variants: Large right A1, large anterior communicating artery, diminutive left A1, normal variant. Associated asymmetric small left-sided left carotid system perfusing the left MCA distribution. Delayed phase: No abnormal intracranial enhancement. Review of the MIP images confirms the above findings IMPRESSION: CT head: 1. Stable hypodensity within the right external capsule and corona radiata corresponding to infarction on prior MRI of the brain. Additional punctate infarcts are poorly visualized on CT. No new acute intracranial abnormality identified. 2. Stable chronic infarct in the right anterior basal ganglia. Stable advanced chronic microvascular ischemic changes of white matter and moderate volume loss of the brain. CTA NECK:. 1. Patent carotid and vertebral arteries. No high-grade stenosis by NASCET criteria, dissection, or aneurysm. 2. Right proximal vertebral artery mild-to-moderate 50% stenosis with fibrofatty plaque. 3. Right proximal ICA mild less than 50% stenosis with mixed plaque. CTA HEAD:. 1. Patent Circle of Willis. No large vessel occlusion, aneurysm, or vascular malformation identified. 2. Intracranial atherosclerosis with multiple segments of mild stenosis in the anterior and posterior circulation. Electronically Signed   By: Mitzi HansenLance  Furusawa-Stratton M.D.   On: 10/13/2018 22:46      PHYSICAL EXAM  Temp:  [97.5 F (36.4 C)-98.4 F (36.9 C)] 98.3 F (36.8 C) (02/11 1130) Pulse Rate:  [54-66] 66 (02/11 1130) Resp:  [16-20] 16 (02/11 1130) BP: (144-174)/(71-89) 145/75 (02/11 1130) SpO2:  [91 %-100 %] 91 % (02/11 1130)  General - Well nourished, well developed, very drowsy sleepy.  Ophthalmologic - fundi not  visualized due to noncooperation.  Cardiovascular - Regular rate and rhythm.  Neuro - drowsy sleepy but arousable with repetitive stimulation. Orientated to his name, age, not orientated to place, time or situation. Did not following other commands, paucity of speech with severe dysarthria. Blinking to visual threat bilaterally. Tracking in both direction. PERRL, eye moving in both lateral direction. Left facial droop, tongue midline in mouth. LUE 3/5, RUE and BLE spontaneous movement against gravity and with pain. DTR 1+ and no babinski. Sensation, coordination and gait not tested.   ASSESSMENT/PLAN Mr. Ojas Alfonse RasHam is a 58 y.o. male with history of CHF, HTN, smoker, alcohol abuse, homeless admitted for s/p fall. Found to have neuro decline, slurry speech, left sided weakness, hypoglycemia. No tPA given due to outside window.    Metabolic encephalopathy  Poor intake and NPO  Put on D5NS @ 100->150  Continue to have hypoglycemia episode - received D50  Ammonia 46->22  Repeat UA - neg  Alcohol withdraw less likely given no tachycardia no tremor - continue CIWA  Stroke:  Multifocal small infarcts including right external capsule, right CR, left cerebellar, suspect intracranial stenosis in the setting of hypotension and hypoglycemia.   Resultant left facial droop and left UE weakness  MRI  Right external capsule, right CR punctate, left cerebellar punctate acute infarcts. Subacute b/l BG small infarcts  CTA head and neck right VA and right ICA about 50% stenosis with mixed plaques  2D Echo  EF 60-65%  LDL 72  HgbA1c 5.0  heparin for VTE prophylaxis  NPO  No antithrombotic  prior to admission, now on ASA 300mg  PR.   Ongoing aggressive stroke risk factor management  Therapy recommendations:  Pending   Disposition:  Pending   hypoglycemia  Likely due to low po intake and alcoholic cirrhosis  On D5NS @ 161 now  HgbA1c 5.0 goal < 7.0  CBG monitoring  Elevated  troponin  Cardiology on board  Troponin trending down  Demand ischemia  Heparin IV stopped  GIB with anemia  Hb 10.3->7.8->8.0->9.7->8.3->9.0->9.9  Stool occult blood positive  GI on board  Plan for EGD once stable  GI is OK for ASA at this time  Thrombocytopenia, improving  Likely part of the reason for GIB  Platelet [id number]  Continue monitoring  Bleeding precautions  Hypertension . Stable, had 2-3 episode of low BP reading after admission  Long term BP goal normotensive  On lisinopril and metoprolol  On IVF  Hyperlipidemia  Home meds:  none   LDL 72, goal < 70  No statin for now given elevated LFT  Cirrhosis with alcohol abuse  US abdomen concerning for cirrhosis, no ascites  GI following  On CIWA protocol  Alcohol withdraw and seizure precautions  Limit alcohol use after discharge  Tobacco abuse  Current smoker  Smoking cessation counseling will be provided  Other Stroke Risk Factors  CHF  Other Active Problems  Homeless  Urinary retention  Hep C   Hospital day # 3   Marvel Plan, MD PhD Stroke Neurology 10/14/2018 12:09 PM    To contact Stroke Continuity provider, please refer to [email or web address]. After hours, contact General Neurology

## 2018-10-15 DIAGNOSIS — I619 Nontraumatic intracerebral hemorrhage, unspecified: Secondary | ICD-10-CM

## 2018-10-15 LAB — CBC
HCT: 25.6 % — ABNORMAL LOW (ref 39.0–52.0)
Hemoglobin: 8.3 g/dL — ABNORMAL LOW (ref 13.0–17.0)
MCH: 33.3 pg (ref 26.0–34.0)
MCHC: 32.4 g/dL (ref 30.0–36.0)
MCV: 102.8 fL — ABNORMAL HIGH (ref 80.0–100.0)
PLATELETS: 133 10*3/uL — AB (ref 150–400)
RBC: 2.49 MIL/uL — ABNORMAL LOW (ref 4.22–5.81)
RDW: 14.1 % (ref 11.5–15.5)
WBC: 6.3 10*3/uL (ref 4.0–10.5)
nRBC: 0 % (ref 0.0–0.2)

## 2018-10-15 LAB — BASIC METABOLIC PANEL
Anion gap: 5 (ref 5–15)
BUN: 6 mg/dL (ref 6–20)
CO2: 22 mmol/L (ref 22–32)
Calcium: 7.4 mg/dL — ABNORMAL LOW (ref 8.9–10.3)
Chloride: 118 mmol/L — ABNORMAL HIGH (ref 98–111)
Creatinine, Ser: 0.93 mg/dL (ref 0.61–1.24)
GFR calc Af Amer: >60 mL/min (ref >60)
GFR calc non Af Amer: >60 mL/min (ref >60)
Glucose, Bld: 119 mg/dL — ABNORMAL HIGH (ref 70–99)
Potassium: 3.4 mmol/L — ABNORMAL LOW (ref 3.5–5.1)
Sodium: 145 mmol/L (ref 135–145)

## 2018-10-15 LAB — GLUCOSE, CAPILLARY
GLUCOSE-CAPILLARY: 133 mg/dL — AB (ref 70–99)
Glucose-Capillary: 108 mg/dL — ABNORMAL HIGH (ref 70–99)
Glucose-Capillary: 94 mg/dL (ref 70–99)
Glucose-Capillary: 115 mg/dL — ABNORMAL HIGH (ref 70–99)
Glucose-Capillary: 114 mg/dL — ABNORMAL HIGH (ref 70–99)

## 2018-10-15 MED ORDER — JEVITY 1.2 CAL PO LIQD
1000.0000 mL | ORAL | Status: DC
  Administered 2018-10-16: 1000 mL
  Filled 2018-10-15 (×2): qty 1000

## 2018-10-15 MED ORDER — PRO-STAT SUGAR FREE PO LIQD
30.0000 mL | Freq: Every day | ORAL | Status: DC
  Administered 2018-10-15 – 2018-10-17 (×3): 30 mL
  Filled 2018-10-15 (×3): qty 30

## 2018-10-15 NOTE — Progress Notes (Signed)
[facility] PROGRESS NOTE    Progress Note  Jacob Bass  VVZ:[id number] DOB: 29-Nov-1960 DOA: 10/09/2018 PCP: Patient, No Pcp Per     Brief Narrative:   Jacob Bass is an 58 y.o. male past medical history significant for hypertension, heart failure tobacco abuse and homeless presents to the hospital after slipping and falling in the rain.  To the hospital his troponins were minimally elevated, cardiology is cleared the patient due to his anemia GI was consulted who proceeded with an EGD and colonoscopy.  The patient mental status continued to deteriorate and was noted to have a left upper extremity weakness with a facial droop and MRI of the brain was done that revealed 13 mm acute infarct on the right external capsule and corona radiata, with several additional punctuated acute early subacute infarcts present bilaterally in basal ganglia and right anterior basal ganglia showed chronic hemorrhagic infarction and advanced microvascular ischemic changes.  GI team is advised is okay to resume antiplatelet therapy.  Assessment/Plan:   Acute encephalopathy likely due to acute external capsule and basal ganglia infarct: MRI of the brain showed results as below. Neurology was consulted. 2D echo was done that showed EF of 60 to 65%. LDL 72 A1c of 5.0. The patient is currently n.p.o. GI has no opposition to starting aspirin. Physical therapy evaluation is pending.  Metabolic encephalopathy: Likely multifactorial in the setting of volume depletion stroke alcohol liver cirrhosis and recurrent hypoglycemia.  [place] management of care and underlying processes.  Episodes hypoglycemia: Likely due to decreased oral intake. He was started on D5, A1c is 5.0. Currently n.p.o.  Macrocytic anemia: Multifactorial likely due to alcohol and possible B12 deficiency. Initially EGD and colonoscopy were recommended but patient became more lethargic. Hemoglobin has remained stable, GI agreed to starting  antiplatelet therapy. Continue Protonix daily.  Elevated troponins : Setting of demand ischemia cardiology was consulted recommended no further work-up.   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 Will need follow-up with GI as an outpatient.  Alcohol abuse: Continue Ativan protocol.  Status post fall: This is probably due to CVA. We will consult physical therapy and Occupational Therapy, the patient was not able to work with physical therapy on 10/14/2018 due to confusion.  Essential hypertension: Pressure has remained stable continue lisinopril and metoprolol will need further titration as an outpatient as tolerated.  Chronic thrombocytopenia: Likely due to alcohol abuse.  Tobacco abuse: Counseling.  Choledocholithiasis: Follow-up as an outpatient.     DVT prophylaxis:Heparin Family Communication:none Disposition Plan/Barrier to D/C: unable to determine Code Status:     Code Status Orders  (From admission, onward)         Start     Ordered   10/09/18 2345  Full code  Continuous     10/09/18 2344        Code Status History    This patient has a current code status but no historical code status.        IV Access:    Peripheral IV   Procedures and diagnostic studies:   Ct Angio Head W Or Wo Contrast  Result Date: 10/13/2018 CLINICAL DATA:  58 y/o  M; stroke for follow-up. EXAM: CT ANGIOGRAPHY HEAD AND NECK TECHNIQUE: Multidetector CT imaging of the head and neck was performed using the standard protocol during bolus administration of intravenous contrast. Multiplanar CT image reconstructions and MIPs were obtained to evaluate the vascular  anatomy. Carotid stenosis measurements (when applicable) are obtained utilizing NASCET criteria, using the distal internal carotid diameter as the denominator. CONTRAST:  75mL ISOVUE-370 IOPAMIDOL (ISOVUE-370) INJECTION  76% COMPARISON:  10/12/2018 CT head. 10/13/2018 MRI head. FINDINGS: CT HEAD FINDINGS Brain: Stable hypodensity within the right external capsule and corona radiata corresponding to infarction on prior MRI of the brain. Additional punctate infarcts are poorly visualized on CT. Stable chronic infarct in the right anterior basal ganglia. Stable advanced chronic microvascular ischemic changes of white matter and moderate volume loss of the brain. No new stroke, hemorrhage, mass effect, extra-axial collection, or herniation identified. Vascular: As below. Skull: Normal. Negative for fracture or focal lesion. Sinuses: Left mastoid tip opacification. Additional visible paranasal sinuses and the mastoid air cells are normally aerated. Orbits: No acute finding. Review of the MIP images confirms the above findings CTA NECK FINDINGS Aortic arch: Bovine very branching. Imaged portion shows no evidence of aneurysm or dissection. No significant stenosis of the major arch vessel origins. Moderate mixed plaque of the aortic arch. Right carotid system: No evidence of dissection, stenosis (50% or greater) or occlusion. Mixed plaque of the carotid bifurcation with mild less than 50% proximal ICA stenosis. Left carotid system: No evidence of dissection, stenosis (50% or greater) or occlusion. Vertebral arteries: Right vertebral artery segment of fibrofatty plaque with mild to moderate 50% stenosis just downstream to the vertebral body origin. No additional segment of hemodynamically significant stenosis, aneurysm, occlusion, or dissection. Skeleton: C3-4 and C4-5 grade 1 anterolisthesis. Mild spondylosis of the cervical spine. No high-grade bony spinal canal stenosis. No acute osseous abnormality is evident. Other neck: Negative. Upper chest: Negative. Review of the MIP images confirms the above findings CTA HEAD FINDINGS Anterior circulation: Bilateral carotid siphon calcified plaque. Mild less than 50% distal left cavernous ICA  stenosis. Left A2 mild stenosis. No large vessel occlusion, aneurysm, vascular malformation, or additional segment of significant stenosis. Posterior circulation: No significant stenosis, proximal occlusion, aneurysm, or vascular malformation. Mild mid basilar stenosis. Mild bilateral P1 stenosis. Venous sinuses: As permitted by contrast timing, patent. Anatomic variants: Large right A1, large anterior communicating artery, diminutive left A1, normal variant. Associated asymmetric small left-sided left carotid system perfusing the left MCA distribution. Delayed phase: No abnormal intracranial enhancement. Review of the MIP images confirms the above findings IMPRESSION: CT head: 1. Stable hypodensity within the right external capsule and corona radiata corresponding to infarction on prior MRI of the brain. Additional punctate infarcts are poorly visualized on CT. No new acute intracranial abnormality identified. 2. Stable chronic infarct in the right anterior basal ganglia. Stable advanced chronic microvascular ischemic changes of white matter and moderate volume loss of the brain. CTA NECK:. 1. Patent carotid and vertebral arteries. No high-grade stenosis by NASCET criteria, dissection, or aneurysm. 2. Right proximal vertebral artery mild-to-moderate 50% stenosis with fibrofatty plaque. 3. Right proximal ICA mild less than 50% stenosis with mixed plaque. CTA HEAD:. 1. Patent Circle of Willis. No large vessel occlusion, aneurysm, or vascular malformation identified. 2. Intracranial atherosclerosis with multiple segments of mild stenosis in the anterior and posterior circulation. Electronically Signed   By: Mitzi Hansen M.D.   On: 10/13/2018 22:46   Dg Chest 2 View  Result Date: 10/13/2018 CLINICAL DATA:  Aspiration pneumonia EXAM: CHEST - 2 VIEW COMPARISON:  10/09/2018, 05/17/2006 FINDINGS: The heart size and mediastinal contours are within normal limits. Aortic atherosclerosis. Both lungs are clear.  The visualized skeletal structures are unremarkable. IMPRESSION: No active cardiopulmonary disease. Electronically Signed  By: Jasmine Pang M.D.   On: 10/13/2018 22:14   Dg Abd 1 View  Result Date: 10/14/2018 CLINICAL DATA:  Feeding tube placement. EXAM: ABDOMEN - 1 VIEW COMPARISON:  None. FINDINGS: The long GI feeding tube tip is in the third portion of the duodenum. IMPRESSION: Feeding tube tip is in the third portion of the duodenum. Electronically Signed   By: Rudie Meyer M.D.   On: 10/14/2018 17:30   Ct Angio Neck W Or Wo Contrast  Result Date: 10/13/2018 CLINICAL DATA:  58 y/o  M; stroke for follow-up. EXAM: CT ANGIOGRAPHY HEAD AND NECK TECHNIQUE: Multidetector CT imaging of the head and neck was performed using the standard protocol during bolus administration of intravenous contrast. Multiplanar CT image reconstructions and MIPs were obtained to evaluate the vascular anatomy. Carotid stenosis measurements (when applicable) are obtained utilizing NASCET criteria, using the distal internal carotid diameter as the denominator. CONTRAST:  75mL ISOVUE-370 IOPAMIDOL (ISOVUE-370) INJECTION 76% COMPARISON:  10/12/2018 CT head. 10/13/2018 MRI head. FINDINGS: CT HEAD FINDINGS Brain: Stable hypodensity within the right external capsule and corona radiata corresponding to infarction on prior MRI of the brain. Additional punctate infarcts are poorly visualized on CT. Stable chronic infarct in the right anterior basal ganglia. Stable advanced chronic microvascular ischemic changes of white matter and moderate volume loss of the brain. No new stroke, hemorrhage, mass effect, extra-axial collection, or herniation identified. Vascular: As below. Skull: Normal. Negative for fracture or focal lesion. Sinuses: Left mastoid tip opacification. Additional visible paranasal sinuses and the mastoid air cells are normally aerated. Orbits: No acute finding. Review of the MIP images confirms the above findings CTA NECK  FINDINGS Aortic arch: Bovine very branching. Imaged portion shows no evidence of aneurysm or dissection. No significant stenosis of the major arch vessel origins. Moderate mixed plaque of the aortic arch. Right carotid system: No evidence of dissection, stenosis (50% or greater) or occlusion. Mixed plaque of the carotid bifurcation with mild less than 50% proximal ICA stenosis. Left carotid system: No evidence of dissection, stenosis (50% or greater) or occlusion. Vertebral arteries: Right vertebral artery segment of fibrofatty plaque with mild to moderate 50% stenosis just downstream to the vertebral body origin. No additional segment of hemodynamically significant stenosis, aneurysm, occlusion, or dissection. Skeleton: C3-4 and C4-5 grade 1 anterolisthesis. Mild spondylosis of the cervical spine. No high-grade bony spinal canal stenosis. No acute osseous abnormality is evident. Other neck: Negative. Upper chest: Negative. Review of the MIP images confirms the above findings CTA HEAD FINDINGS Anterior circulation: Bilateral carotid siphon calcified plaque. Mild less than 50% distal left cavernous ICA stenosis. Left A2 mild stenosis. No large vessel occlusion, aneurysm, vascular malformation, or additional segment of significant stenosis. Posterior circulation: No significant stenosis, proximal occlusion, aneurysm, or vascular malformation. Mild mid basilar stenosis. Mild bilateral P1 stenosis. Venous sinuses: As permitted by contrast timing, patent. Anatomic variants: Large right A1, large anterior communicating artery, diminutive left A1, normal variant. Associated asymmetric small left-sided left carotid system perfusing the left MCA distribution. Delayed phase: No abnormal intracranial enhancement. Review of the MIP images confirms the above findings IMPRESSION: CT head: 1. Stable hypodensity within the right external capsule and corona radiata corresponding to infarction on prior MRI of the brain. Additional  punctate infarcts are poorly visualized on CT. No new acute intracranial abnormality identified. 2. Stable chronic infarct in the right anterior basal ganglia. Stable advanced chronic microvascular ischemic changes of white matter and moderate volume loss of the brain. CTA NECK:. 1.  Patent carotid and vertebral arteries. No high-grade stenosis by NASCET criteria, dissection, or aneurysm. 2. Right proximal vertebral artery mild-to-moderate 50% stenosis with fibrofatty plaque. 3. Right proximal ICA mild less than 50% stenosis with mixed plaque. CTA HEAD:. 1. Patent Circle of Willis. No large vessel occlusion, aneurysm, or vascular malformation identified. 2. Intracranial atherosclerosis with multiple segments of mild stenosis in the anterior and posterior circulation. Electronically Signed   By: Mitzi HansenLance  Furusawa-Stratton M.D.   On: 10/13/2018 22:46     Medical Consultants:    None.  Anti-Infectives:   None  Subjective:    Jacob Bass he relates no new complaints.  Objective:    Vitals:   10/14/18 2000 10/15/18 0009 10/15/18 0349 10/15/18 0729  BP: (!) 146/64 (!) 124/53 (!) 141/68 (!) 147/74  Pulse: 60 (!) 57 69 67  Resp: 18 16 17 18   Temp: 98.5 F (36.9 C) 98.3 F (36.8 C) 98.9 F (37.2 C) 98.1 F (36.7 C)  TempSrc: Oral Oral Oral Oral  SpO2: 100% 100% 100%   Weight:      Height:        Intake/Output Summary (Last 24 hours) at 10/15/2018 0744 Last data filed at 10/15/2018 0500 Gross per 24 hour  Intake 3143.5 ml  Output 715 ml  Net 2428.5 ml   Filed Weights   10/11/18 0558 10/12/18 0521 10/13/18 0700  Weight: 64 kg 63.5 kg 62.1 kg    Exam: General exam: In no acute distress. Respiratory system: Good air movement and clear to auscultation. Cardiovascular system: Regular rate and rhythm with positive S1-S2 Gastrointestinal system: Abdomen is nondistended, soft and nontender.  Central nervous system: Alert and oriented. No focal neurological deficits. Extremities: No  pedal edema. Skin: No rashes, lesions or ulcers    Data Reviewed:    Labs: Basic Metabolic Panel: Recent Labs  Lab 10/09/18 1315 10/09/18 1455 10/13/18 1209 10/14/18 0459 10/15/18 0538  NA 139  --  143 144 145  K 4.3  --  3.7 3.6 3.4*  CL 101  --  115* 114* 118*  CO2 23  --  23 24 22   GLUCOSE 96  --  85 106* 119*  BUN 34*  --  8 7 6   CREATININE 0.99  --  0.78 0.85 0.93  CALCIUM 8.3*  --  7.8* 7.9* 7.4*  MG  --  1.9  --  1.8  --    GFR Estimated Creatinine Clearance: 68.8 mL/min (by C-G formula based on SCr of 0.93 mg/dL). Liver Function Tests: Recent Labs  Lab 10/09/18 1315 10/11/18 0604 10/13/18 1209  AST 133* 95* 77*  ALT 76* 63* 63*  ALKPHOS 76 65 63  BILITOT 2.0* 1.2 1.5*  PROT 6.7 5.4* 5.8*  ALBUMIN 2.5* 2.0* 2.2*   Recent Labs  Lab 10/09/18 1315  LIPASE 30   Recent Labs  Lab 10/13/18 1209 10/14/18 1222  AMMONIA 46* 22   Coagulation profile Recent Labs  Lab 10/11/18 1242 10/13/18 1209  INR 1.23 1.33    CBC: Recent Labs  Lab 10/09/18 1315  10/10/18 1036 10/11/18 0604 10/13/18 0517 10/14/18 0459 10/15/18 0538  WBC 9.6   < > 7.6 5.4 6.9 7.9 6.3  NEUTROABS 6.6  --   --   --   --   --   --   HGB 10.3*   < > 9.7* 8.3* 9.0* 9.9* 8.3*  HCT 31.1*   < > 28.4* 24.4* 27.1* 29.2* 25.6*  MCV 100.6*   < > 100.0 100.4*  100.4* 102.1* 102.8*  PLT 100*   < > 108* 99* 121* 136* 133*   < > = values in this interval not displayed.   Cardiac Enzymes: Recent Labs  Lab 10/09/18 1725 10/09/18 1947 10/09/18 2356 10/10/18 0313 10/10/18 0520  TROPONINI 0.04* 0.05* 0.05* 0.04* 0.04*   BNP (last 3 results) No results for input(s): PROBNP in the last 8760 hours. CBG: Recent Labs  Lab 10/14/18 0932 10/14/18 1110 10/14/18 2148 10/14/18 2341 10/15/18 0345  GLUCAP 107* 131* 97 152* 108*   D-Dimer: No results for input(s): DDIMER in the last 72 hours. Hgb A1c: Recent Labs    10/13/18 1209  HGBA1C 5.0   Lipid Profile: No results for input(s):  CHOL, HDL, LDLCALC, TRIG, CHOLHDL, LDLDIRECT in the last 72 hours. Thyroid function studies: No results for input(s): TSH, T4TOTAL, T3FREE, THYROIDAB in the last 72 hours.  Invalid input(s): FREET3 Anemia work up: No results for input(s): VITAMINB12, FOLATE, FERRITIN, TIBC, IRON, RETICCTPCT in the last 72 hours. Sepsis Labs: Recent Labs  Lab 10/11/18 0604 10/13/18 0517 10/13/18 2334 10/14/18 0459 10/15/18 0538  PROCALCITON  --   --  <0.10  --   --   WBC 5.4 6.9  --  7.9 6.3   Microbiology Recent Results (from the past 240 hour(s))  Culture, blood (routine x 2)     Status: None (Preliminary result)   Collection Time: 10/13/18  7:59 PM  Result Value Ref Range Status   Specimen Description BLOOD LEFT HAND  Final   Special Requests   Final    BOTTLES DRAWN AEROBIC AND ANAEROBIC Blood Culture adequate volume   Culture   Final    NO GROWTH 2 DAYS Performed at [facility], [address] [place], [place] [address]    Report Status PENDING  Incomplete  Culture, blood (routine x 2)     Status: None (Preliminary result)   Collection Time: 10/13/18  7:59 PM  Result Value Ref Range Status   Specimen Description BLOOD RIGHT HAND  Final   Special Requests   Final    BOTTLES DRAWN AEROBIC ONLY Blood Culture adequate volume   Culture   Final    NO GROWTH 2 DAYS Performed at [facility], [address] [place], [place] [address]    Report Status PENDING  Incomplete     Medications:   . aspirin  300 mg Rectal Daily  . folic acid  1 mg Oral Daily  . haloperidol lactate  5 mg Intravenous Once  . heparin injection (subcutaneous)  5,000 Units Subcutaneous Q8H  . lisinopril  10 mg Oral Daily  . metoprolol tartrate  25 mg Per Tube BID  . multivitamin with minerals  1 tablet Oral Daily  . pantoprazole  40 mg Oral Daily  . polyethylene glycol  4,000 mL Oral Once  . thiamine  100 mg Oral Daily   Continuous Infusions: . dextrose 5 % and 0.9% NaCl 150 mL/hr at 10/14/18  1125  . feeding supplement (JEVITY 1.2 CAL) 30 mL/hr at 10/15/18 0301     LOS: 4 days   Jacob Bass  [facility]  10/15/2018, 7:44 AM

## 2018-10-15 NOTE — Procedures (Signed)
Cortrak  Tube Type:  Cortrak - 43 inches Tube Location:  Left nare Initial Placement:  Postpyloric Secured by: Bridle Technique Used to Measure Tube Placement:  Documented cm marking at nare/ corner of mouth Cortrak Secured At:  84 cm    Cortrak Tube Team Note:  Consult received to place a bridle for the Cortrak feeding tube.   Pt had cortrak tube placed in IR 2/11.  Bridle placed for cortrak tube, tube in same position prior to bridle placement.  No x-ray is required. RN may begin using tube.    If the tube becomes dislodged please keep the tube and contact the Cortrak team at [email or web address] (password [email or web address]) for replacement.  If after hours and replacement cannot be delayed, place a NG tube and confirm placement with an abdominal x-ray.    Kendell Bane RD, LDN, CNSC [phone] Pager [phone] After Hours Pager

## 2018-10-15 NOTE — Evaluation (Signed)
Occupational Therapy Evaluation Patient Details Name: Jacob Bass MRN: [id number] DOB: [birth year] Today's Date: 10/15/2018    History of Present Illness 58 y.o. male past medical history significant for hypertension, heart failure tobacco abuse and homeless presents to the hospital after slipping and falling in the rain.  To the hospital his troponins were minimally elevated, cardiology is cleared the patient due to his anemia GI was consulted who proceeded with an EGD and colonoscopy.  The patient mental status continued to deteriorate and was noted to have a left upper extremity weakness with a facial droop and MRI of the brain was done that revealed 13 mm acute infarct on the right external capsule and corona radiata, with several additional punctuated acute early subacute infarcts present bilaterally in basal ganglia and right anterior basal ganglia showed chronic hemorrhagic infarction and advanced microvascular ischemic changes.  GI team is advised is okay to resume antiplatelet therapy.   Clinical Impression   PT admitted with R external capsule and corona radiata with basal ganglia and R anterior basla ganglia showed chronic hemorrhagic infarction and microvacular ischemic changes. Pt currently with functional limitiations due to the deficits listed below (see OT problem list). Pt currently max (A) for adls and lacks awareness to incontinence. Pt with slurred speech and responses easier to understand with Yes/ no cues.  Pt will benefit from skilled OT to increase their independence and safety with adls and balance to allow discharge SNF.      Follow Up Recommendations  SNF    Equipment Recommendations  3 in 1 bedside commode    Recommendations for Other Services       Precautions / Restrictions Precautions Precautions: Fall Precaution Comments: bil mittens  Restrictions Weight Bearing Restrictions: No      Mobility Bed Mobility Overal bed mobility: Needs Assistance Bed  Mobility: Supine to Sit     Supine to sit: Min assist Sit to supine: Mod assist   General bed mobility comments: pt unable to sustain static sitting  Transfers Overall transfer level: Needs assistance Equipment used: 1 person hand held assist Transfers: Sit to/from Stand Sit to Stand: Min assist         General transfer comment: bed level only    Balance Overall balance assessment: Needs assistance Sitting-balance support: Feet supported;Single extremity supported Sitting balance-Leahy Scale: Fair Sitting balance - Comments: Lt UE relatively flaccid   Standing balance support: Single extremity supported Standing balance-Leahy Scale: Poor                             ADL either performed or assessed with clinical judgement   ADL Overall ADL's : Needs assistance/impaired                                       General ADL Comments: pt incontinent of bowels and reports "i am good! no i didnt" pt with wet surface to bed and unaware. pt verbalized that he does not need linen change or hygiene after education. pt attempting to exit the bed on the Left with bed mobility. pt requires two person (A) for safety     Vision   Additional Comments: difficult to assess at this time. pt scanning therapist in the room     Perception     Praxis      Pertinent Vitals/Pain Pain Assessment: No/denies pain  Hand Dominance Right   Extremity/Trunk Assessment Upper Extremity Assessment Upper Extremity Assessment: LUE deficits/detail LUE Deficits / Details: noted decr activation-- not following commands well for formal testing.        Cervical / Trunk Assessment Cervical / Trunk Assessment: Normal   Communication Communication Communication: No difficulties   Cognition Arousal/Alertness: Awake/alert Behavior During Therapy: Impulsive Overall Cognitive Status: No family/caregiver present to determine baseline cognitive functioning                                  General Comments: unknown   General Comments  risk for skin break down     Exercises General Exercises - Upper Extremity Shoulder Flexion: PROM;Left;10 reps;Supine;AAROM;Right General Exercises - Lower Extremity Short Arc Quad: AAROM;Strengthening;Both;Supine Heel Slides: AAROM;Strengthening;Both;10 reps Toe Raises: AROM;Strengthening;Both;10 reps Heel Raises: AROM;Strengthening;Both;10 reps   Shoulder Instructions      Home Living Family/patient expects to be discharged to:: Shelter/Homeless     Type of Home: Homeless                           Additional Comments: from chart review: Patient reports 3-4 flights of stairs with and without rails at shelter however unsure of accuracy       Prior Functioning/Environment Level of Independence: Independent with assistive device(s)        Comments: Patient reports using a cane at baseline. Initially reports using RW but later reports just using cane and does not own a RW. RN reports, patient uses wheelchair.        OT Problem List: Decreased strength;Decreased activity tolerance;Impaired balance (sitting and/or standing);Decreased coordination;Decreased safety awareness;Decreased knowledge of use of DME or AE;Decreased knowledge of precautions;Cardiopulmonary status limiting activity;Impaired UE functional use      OT Treatment/Interventions: Self-care/ADL training;Therapeutic exercise;Neuromuscular education;Energy conservation;DME and/or AE instruction;Manual therapy;Modalities;Therapeutic activities;Cognitive remediation/compensation;Patient/family education;Balance training;Visual/perceptual remediation/compensation    OT Goals(Current goals can be found in the care plan section) Acute Rehab OT Goals Patient Stated Goal: none stated OT Goal Formulation: Patient unable to participate in goal setting Time For Goal Achievement: 10/29/18 Potential to Achieve Goals: Good  OT Frequency:  Min 2X/week   Barriers to D/C: Decreased caregiver support          Co-evaluation              AM-PAC OT "6 Clicks" Daily Activity     Outcome Measure Help from another person eating meals?: A Lot Help from another person taking care of personal grooming?: A Lot Help from another person toileting, which includes using toliet, bedpan, or urinal?: A Lot Help from another person bathing (including washing, rinsing, drying)?: A Lot Help from another person to put on and taking off regular upper body clothing?: A Lot Help from another person to put on and taking off regular lower body clothing?: A Lot 6 Click Score: 12   End of Session Nurse Communication: Mobility status;Precautions  Activity Tolerance: Patient tolerated treatment well Patient left: in bed;with call bell/phone within reach;with bed alarm set;with nursing/sitter in room  OT Visit Diagnosis: Unsteadiness on feet (R26.81);Muscle weakness (generalized) (M62.81)                Time: 2831-5176 OT Time Calculation (min): 12 min Charges:  OT General Charges $OT Visit: 1 Visit OT Evaluation $OT Eval Moderate Complexity: 1 Mod   Mateo Flow, OTR/L  Acute Rehabilitation Services Pager: [phone] Office:  [phone] .   Mateo FlowBrynn Aleyda Gindlesperger 10/15/2018, 3:57 PM

## 2018-10-15 NOTE — Evaluation (Signed)
Clinical/Bedside Swallow Evaluation Patient Details  Name: Jacob Bass MRN: [id number] Date of Birth: 1961-03-15  Today's Date: Bass Time: SLP Start Time (ACUTE ONLY): 1132 SLP Stop Time (ACUTE ONLY): 1145 SLP Time Calculation (min) (ACUTE ONLY): 13 min  Past Medical History:  Past Medical History:  Diagnosis Date  . CHF (congestive heart failure) (HCC)   . Hypertension    Past Surgical History: History reviewed. No pertinent surgical history. HPI:  58 y.o. male with past medical history significant for hypertension, tobacco abuse, and homelessness; who presented 2/8 after slipping and falling in the rain.  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   On 2/9. pt presented with neuro changes with left sided weakness and dysarthria due to actue right ext capsule/basal ganglia/corona radiata CVA. EGD/colonscopy pending, but on hold due to changes in MS. Cortrak placed in IR 2/11. MRI 2/9: 13 mm acute/early subacute infarction within right external capsule and corona radiata. Several additional punctate acute/early subacute infarcts are present in bilateral basal ganglia. 3. 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 Advanced chronic microvascular ischemic changes and moderate volume loss of the brain.   Assessment / Plan / Recommendation Clinical Impression   Pt presents with a neurogenic dysphagia characterized by decreased sensorimotor function relative to CN V, VII, and XII on the left; decreased attention to POs in left lateral sulcus; overt coughing after consumption of tspns and sips of thin liquid, concerning for aspiration.  Recommend proceeding with MBS to elucidate nature of dysphagia.  Continue NPO, nutrition via cortrak pending study.  SLP Visit Diagnosis: Dysphagia, unspecified (R13.10)    Aspiration Risk       Diet Recommendation   NPO; continue cortrak  Medication Administration: Via alternative means    Other  Recommendations  Oral Care Recommendations: Oral care QID   Follow up Recommendations    MBS    Frequency and Duration            Prognosis        Swallow Study   General Date of Onset: 10/11/18 HPI: 58 y.o. male with past medical history significant for hypertension, tobacco abuse, and homelessness; who presented 2/8 after slipping and falling in the rain.  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   On 2/9. pt presented with neuro changes with left sided weakness and dysarthria due to actue right ext capsule/basal ganglia/corona radiata CVA. EGD/colonscopy pending, but on hold due to changes in MS. Cortrak placed in IR 2/11. MRI 2/9: 13 mm acute/early subacute infarction within right external capsule and corona radiata. Several additional punctate acute/early subacute infarcts are present in bilateral basal ganglia. 3. 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 Advanced chronic microvascular ischemic changes and moderate volume loss of the brain. Type of Study: Bedside Swallow Evaluation Previous Swallow Assessment: no Diet Prior to this Study: NPO;NG Tube Temperature Spikes Noted: No Respiratory Status: Room air History of Recent Intubation: No Behavior/Cognition: Alert;Cooperative;Confused Oral Cavity Assessment: Dried secretions Oral Care Completed by SLP: Yes Oral Cavity - Dentition: Missing dentition Self-Feeding Abilities: Needs assist Patient Positioning: Upright in bed Baseline Vocal Quality: Normal Volitional Cough: Strong Volitional Swallow: Able to elicit    Oral/Motor/Sensory Function Overall Oral Motor/Sensory Function: Mild impairment Facial ROM: Reduced left;Suspected CN VII (facial) dysfunction Facial Symmetry: Abnormal symmetry left;Suspected CN VII (facial) dysfunction Facial Sensation: Reduced left;Suspected CN V (Trigeminal) dysfunction Lingual Symmetry: Suspected CN XII (hypoglossal) dysfunction Velum: Within Functional  Limits Mandible: Within Functional Limits  Ice Chips Ice chips: Within functional limits   Thin Liquid Thin Liquid: Impaired Presentation: Cup;Spoon Oral Phase Functional Implications: Oral holding Pharyngeal  Phase Impairments: Multiple swallows;Cough - Immediate    Nectar Thick Nectar Thick Liquid: Not tested   Honey Thick Honey Thick Liquid: Not tested   Puree Puree: Impaired Presentation: Spoon Oral Phase Functional Implications: Prolonged oral transit Pharyngeal Phase Impairments: Multiple swallows   Solid     Solid: Not tested      Jacob Bass, Jacob Bass,Jacob Bass   Jacob Bass Fredericouture, MA CCC/SLP Acute Rehabilitation Services Office number [phone] Pager [phone]

## 2018-10-15 NOTE — Evaluation (Signed)
Speech Language Pathology Evaluation Patient Details Name: Jacob Bass MRN: [id number] DOB: 09/19/1960 Today's Date: 10/15/2018 Time: 6808-8110 SLP Time Calculation (min) (ACUTE ONLY): 17 min  Problem List:  Patient Active Problem List   Diagnosis Date Noted  . Cerebral thrombosis with cerebral infarction 10/13/2018  . Cerebral embolism with cerebral infarction 10/13/2018  . Subarachnoid hemorrhage 10/13/2018  . Intracerebral hemorrhage 10/13/2018  . Heme positive stool   . Cirrhosis of liver without ascites (HCC)   . Chest pain 10/09/2018  . Hepatitis C antibody positive in blood 10/09/2018  . EKG abnormalities 10/09/2018  . Homelessness 10/09/2018  . Blood pressure elevated without history of HTN 10/09/2018  . Anemia, macrocytic 10/09/2018  . Thrombocytopenia (HCC) 10/09/2018   Past Medical History:  Past Medical History:  Diagnosis Date  . CHF (congestive heart failure) (HCC)   . Hypertension    Past Surgical History: History reviewed. No pertinent surgical history. HPI:  58 y.o. male with past medical history significant for hypertension, tobacco abuse, and homelessness; who presented 2/8 after slipping and falling in the rain.  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   On 2/9. pt presented with neuro changes with left sided weakness and dysarthria due to actue right ext capsule/basal ganglia/corona radiata CVA. EGD/colonscopy pending, but on hold due to changes in MS. Cortrak placed in IR 2/11. MRI 2/9: 13 mm acute/early subacute infarction within right external capsule and corona radiata. Several additional punctate acute/early subacute infarcts are present in bilateral basal ganglia. 3. 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 Advanced chronic microvascular ischemic changes and moderate volume loss of the brain.   Assessment / Plan / Recommendation Clinical Impression  Pt presents with moderate right hemisphere disorder marked  by adequate focused but impaired sustained attention, poor awareness of deficits and impact on function, decreased working and short-term memory, and disorientation to situation/time.  Pt is friendly, conversational, but speech is moderately dysarthric.  He will benefit from speech therapy while in acute care to address above deficits; situation is obviously difficult given his homelessness.      SLP Assessment  SLP Recommendation/Assessment: Patient needs continued Speech Lanaguage Pathology Services SLP Visit Diagnosis: Cognitive communication deficit (R41.841)    Follow Up Recommendations  Other (comment)(tba)    Frequency and Duration min 3x week  2 weeks      SLP Evaluation Cognition  Overall Cognitive Status: No family/caregiver present to determine baseline cognitive functioning Arousal/Alertness: Awake/alert Orientation Level: Oriented to person;Disoriented to place;Disoriented to time;Disoriented to situation Attention: Sustained Sustained Attention: Impaired Sustained Attention Impairment: Verbal basic Memory: Impaired Memory Impairment: Storage deficit;Retrieval deficit Awareness: Impaired Awareness Impairment: Intellectual impairment Problem Solving: Impaired Problem Solving Impairment: Verbal basic Safety/Judgment: Impaired       Comprehension  Auditory Comprehension Overall Auditory Comprehension: Appears within functional limits for tasks assessed Reading Comprehension Reading Status: Not tested    Expression Verbal Expression Overall Verbal Expression: Appears within functional limits for tasks assessed   Oral / Motor  Oral Motor/Sensory Function Overall Oral Motor/Sensory Function: Mild impairment Facial ROM: Reduced left;Suspected CN VII (facial) dysfunction Facial Symmetry: Abnormal symmetry left;Suspected CN VII (facial) dysfunction Facial Sensation: Reduced left;Suspected CN V (Trigeminal) dysfunction Lingual Symmetry: Suspected CN XII (hypoglossal)  dysfunction Velum: Within Functional Limits Mandible: Within Functional Limits Motor Speech Overall Motor Speech: Impaired Respiration: Impaired Level of Impairment: Phrase Phonation: Normal Articulation: Impaired Level of Impairment: Phrase Intelligibility: Intelligibility reduced Word: 50-74% accurate Phrase: 50-74% accurate Sentence: 50-74% accurate Motor Speech Errors: Not  applicable   GO                    [name], [name] [name] 10/15/2018, 1:45 PM  [name], MA CCC/SLP Acute Rehabilitation Services Office number [phone] Pager [phone]

## 2018-10-15 NOTE — Progress Notes (Signed)
Physical Therapy Treatment Patient Details Name: Jacob Bass MRN: [id number] DOB: 01/15/1961 Today's Date: 10/15/2018    History of Present Illness Jacob Bass is an 58 y.o. male past medical history significant for hypertension, heart failure tobacco abuse and homeless presents to the hospital after slipping and falling in the rain.  To the hospital his troponins were minimally elevated, cardiology is cleared the patient due to his anemia GI was consulted who proceeded with an EGD and colonoscopy.  The patient mental status continued to deteriorate and was noted to have a left upper extremity weakness with a facial droop and MRI of the brain was done that revealed 13 mm acute infarct on the right external capsule and corona radiata, with several additional punctuated acute early subacute infarcts present bilaterally in basal ganglia and right anterior basal ganglia showed chronic hemorrhagic infarction and advanced microvascular ischemic changes.  GI team is advised is okay to resume antiplatelet therapy.    PT Comments    Pt admitted with above diagnosis. Pt currently with functional limitations due to the deficits listed below (see PT Problem List). MD note reviewed to continue with PT. Patient has had a decline in ability. Patient agreeable to participating in therapy. Therapeutic exercises completed in supine. Patient required min assist to min guard for bed mobility. Upon sitting, patient required min assist to maintain upright posture and did not attempt to utilize Lt UE for support, leaning to the left. Once accommodated to position, patient was able to sit with supervision but Lt UE continued to remain flaccid. Swelling of Lt UE noted. PROM of Lt UE performed in supine. Patient required min assist for sit to stand transfer. Nursing was attempting to work with patient's NG tube line for correct function during therapy session, therefore ambulation was not attempted this date. Patient may benefit  from a hemi-walker or SPC.  Pt will benefit from skilled PT to increase their independence and safety with mobility to allow discharge to the venue listed below.     Follow Up Recommendations  Supervision for mobility/OOB;Supervision/Assistance - 24 hour( )     Equipment Recommendations  None recommended by PT(to be determined at next venue of care.)    Recommendations for Other Services       Precautions / Restrictions Precautions Precautions: Fall Restrictions Weight Bearing Restrictions: No    Mobility  Bed Mobility Overal bed mobility: Needs Assistance Bed Mobility: Supine to Sit;Sit to Supine     Supine to sit: Min assist Sit to supine: Min assist   General bed mobility comments: Patient required min assist to sit EOB initial and then supervision for safety. Required cues for sequencing. Required minA for bilat LE assit to return to supine   Transfers Overall transfer level: Needs assistance Equipment used: 1 person hand held assist Transfers: Sit to/from Stand Sit to Stand: Min assist         General transfer comment: Patient require min A to stand in place for 2 minutes; Lt LE remained at patient's side w/ patient using Rt UE to hold onto therapist.  Ambulation/Gait Ambulation/Gait assistance: Min guard           General Gait Details: unsafe to attempt today.   Stairs             Wheelchair Mobility    Modified Rankin (Stroke Patients Only)       Balance Overall balance assessment: Needs assistance Sitting-balance support: Feet supported;Single extremity supported Sitting balance-Leahy Scale: Fair Sitting balance - Comments:  Lt UE relatively flaccid   Standing balance support: Single extremity supported Standing balance-Leahy Scale: Poor                              Cognition Arousal/Alertness: Awake/alert Behavior During Therapy: WFL for tasks assessed/performed Overall Cognitive Status: No family/caregiver present to  determine baseline cognitive functioning                                        Exercises General Exercises - Upper Extremity Shoulder Flexion: PROM;Left;10 reps;Supine;AAROM;Right General Exercises - Lower Extremity Short Arc Quad: AAROM;Strengthening;Both;Supine Heel Slides: AAROM;Strengthening;Both;10 reps Toe Raises: AROM;Strengthening;Both;10 reps Heel Raises: AROM;Strengthening;Both;10 reps    General Comments        Pertinent Vitals/Pain Pain Assessment: No/denies pain    Home Living       Type of Home: Homeless              Prior Function            PT Goals (current goals can now be found in the care plan section) Acute Rehab PT Goals Patient Stated Goal: to have less pain PT Goal Formulation: With patient Time For Goal Achievement: 10/24/18 Potential to Achieve Goals: Good Progress towards PT goals: Goals downgraded-see care plan    Frequency    Min 3X/week      PT Plan Discharge plan needs to be updated    Co-evaluation              AM-PAC PT "6 Clicks" Mobility   Outcome Measure  Help needed turning from your back to your side while in a flat bed without using bedrails?: A Little Help needed moving from lying on your back to sitting on the side of a flat bed without using bedrails?: A Lot Help needed moving to and from a bed to a chair (including a wheelchair)?: A Lot Help needed standing up from a chair using your arms (e.g., wheelchair or bedside chair)?: A Lot Help needed to walk in hospital room?: A Lot Help needed climbing 3-5 steps with a railing? : A Lot 6 Click Score: 13    End of Session Equipment Utilized During Treatment: Gait belt Activity Tolerance: Patient limited by fatigue;Patient tolerated treatment well Patient left: in bed;with call bell/phone within reach;with bed alarm set Nurse Communication: Mobility status PT Visit Diagnosis: Unsteadiness on feet (R26.81);Difficulty in walking, not  elsewhere classified (R26.2)     Time: 1310-1340 PT Time Calculation (min) (ACUTE ONLY): 30 min  Charges:  $Therapeutic Exercise: 8-22 mins $Therapeutic Activity: 8-22 mins                     Katina Dung. Hartnett-Rands, MS, PT [place] [facility] #[id number] 10/15/2018, 2:37 PM

## 2018-10-15 NOTE — Progress Notes (Addendum)
Initial Nutrition Assessment  DOCUMENTATION CODES:   Not applicable  INTERVENTION:  Increase Jevity 1.2 formula via Cortrak NGT to new goal rate of 60 ml/hr.  Provide 30 ml Prostat once daily per tube.   Tube feeding regimen to provide 1828 kcal (100% of needs), 95 grams of protein, and 1166 ml water.   Once IV fluids are discontinued, recommend free water flushes of 150 ml TID.  NUTRITION DIAGNOSIS:   Inadequate oral intake related to inability to eat, dysphagia as evidenced by NPO status.  GOAL:   Patient will meet greater than or equal to 90% of their needs  MONITOR:   TF tolerance, Labs, Weight trends, I & O's, Skin  REASON FOR ASSESSMENT:   Consult Enteral/tube feeding initiation and management  ASSESSMENT:   58 y.o. male with past medical history significant for hypertension, CHF, tobacco abuse and homelessness. Presents after fall.  MRI brain revealed 13 mm acute/early subacute infarction within right external capsule and corona radiata, several additional punctate acute/early subacute infarcts present bilateral basal ganglia, right anterior basal ganglia chronic hemorrhagic infarction and advanced chronic microvascular ischemic changes and moderate volume loss of the brain.   Pt anemic on admission. Stool hemoccult positive.   Pt unable to stay awake during RD assessment. No family at bedside. RD unable to obtain nutrition history. EGD and colonoscopy cancelled yesterday due to decrease in pt mental status. Per SLP evaluation, pt with neurogenic dysphagia with decreased sensorimotor function and recommends NPO. Postpyloric Cortrak NGT placed by IR yesterday. Tube feeding using Jevity 1.2 formula at rate of 50 ml/hr infusing during time of visit, which is providing 1440 kcal (85% of kcal needs) and 67 grams of protein (84% of protein needs). RD to modify tube feeding orders to better meet nutrition needs.   Labs and medications reviewed.   NUTRITION - FOCUSED PHYSICAL  EXAM:    Most Recent Value  Orbital Region  No depletion  Upper Arm Region  No depletion  Thoracic and Lumbar Region  No depletion  Buccal Region  No depletion  Temple Region  No depletion  Clavicle Bone Region  No depletion  Clavicle and Acromion Bone Region  No depletion  Scapular Bone Region  Unable to assess  Dorsal Hand  No depletion  Patellar Region  No depletion  Anterior Thigh Region  No depletion  Posterior Calf Region  No depletion  Edema (RD Assessment)  Mild  Hair  Reviewed  Eyes  Reviewed  Mouth  Reviewed  Skin  Reviewed  Nails  Reviewed       Diet Order:   Diet Order            Diet NPO time specified  Diet effective midnight              EDUCATION NEEDS:   Not appropriate for education at this time  Skin:  Skin Assessment: Reviewed RN Assessment  Last BM:  2/8  Height:   Ht Readings from Last 1 Encounters:  10/09/18 5\' 3"  (1.6 m)    Weight:   Wt Readings from Last 1 Encounters:  10/13/18 62.1 kg    Ideal Body Weight:  56.36 kg  BMI:  Body mass index is 24.25 kg/m.  Estimated Nutritional Needs:   Kcal:  1700-1900  Protein:  80-95 grams  Fluid:  1.7 - 1.9 L/day    Roslyn Smiling, MS, RD, LDN Pager # [phone] After hours/ weekend pager # [phone]

## 2018-10-16 ENCOUNTER — Inpatient Hospital Stay (HOSPITAL_COMMUNITY): Payer: Medicaid Other

## 2018-10-16 LAB — BASIC METABOLIC PANEL
Anion gap: 10 (ref 5–15)
BUN: 6 mg/dL (ref 6–20)
CO2: 18 mmol/L — ABNORMAL LOW (ref 22–32)
Calcium: 7.7 mg/dL — ABNORMAL LOW (ref 8.9–10.3)
Chloride: 118 mmol/L — ABNORMAL HIGH (ref 98–111)
Creatinine, Ser: 0.87 mg/dL (ref 0.61–1.24)
GFR calc Af Amer: >60 mL/min (ref >60)
GFR calc non Af Amer: >60 mL/min (ref >60)
Glucose, Bld: 133 mg/dL — ABNORMAL HIGH (ref 70–99)
POTASSIUM: 3.5 mmol/L (ref 3.5–5.1)
Sodium: 146 mmol/L — ABNORMAL HIGH (ref 135–145)

## 2018-10-16 LAB — GLUCOSE, CAPILLARY
GLUCOSE-CAPILLARY: 107 mg/dL — AB (ref 70–99)
Glucose-Capillary: 102 mg/dL — ABNORMAL HIGH (ref 70–99)
Glucose-Capillary: 125 mg/dL — ABNORMAL HIGH (ref 70–99)
Glucose-Capillary: 68 mg/dL — ABNORMAL LOW (ref 70–99)
Glucose-Capillary: 85 mg/dL (ref 70–99)
Glucose-Capillary: 87 mg/dL (ref 70–99)
Glucose-Capillary: 96 mg/dL (ref 70–99)

## 2018-10-16 LAB — URINE CULTURE

## 2018-10-16 LAB — CBC
HCT: 26.4 % — ABNORMAL LOW (ref 39.0–52.0)
Hemoglobin: 9 g/dL — ABNORMAL LOW (ref 13.0–17.0)
MCH: 34.9 pg — ABNORMAL HIGH (ref 26.0–34.0)
MCHC: 34.1 g/dL (ref 30.0–36.0)
MCV: 102.3 fL — ABNORMAL HIGH (ref 80.0–100.0)
Platelets: 148 10*3/uL — ABNORMAL LOW (ref 150–400)
RBC: 2.58 MIL/uL — ABNORMAL LOW (ref 4.22–5.81)
RDW: 14.6 % (ref 11.5–15.5)
WBC: 8.7 10*3/uL (ref 4.0–10.5)
nRBC: 0 % (ref 0.0–0.2)

## 2018-10-16 MED ORDER — SODIUM CHLORIDE 0.9% FLUSH
10.0000 mL | Freq: Two times a day (BID) | INTRAVENOUS | Status: DC
  Administered 2018-10-16 – 2018-10-21 (×6): 10 mL

## 2018-10-16 MED ORDER — LISINOPRIL 20 MG PO TABS
40.0000 mg | ORAL_TABLET | Freq: Every day | ORAL | Status: DC
  Administered 2018-10-16 – 2018-10-25 (×9): 40 mg via ORAL
  Filled 2018-10-16 (×9): qty 2

## 2018-10-16 MED ORDER — AMLODIPINE BESYLATE 2.5 MG PO TABS
2.5000 mg | ORAL_TABLET | Freq: Every day | ORAL | Status: DC
  Administered 2018-10-16 – 2018-10-27 (×11): 2.5 mg via ORAL
  Filled 2018-10-16 (×11): qty 1

## 2018-10-16 MED ORDER — SODIUM CHLORIDE 0.9% FLUSH: 10.0000 mL | INTRAVENOUS | Status: DC | PRN

## 2018-10-16 MED ORDER — ASPIRIN 325 MG PO TABS
325.0000 mg | ORAL_TABLET | Freq: Every day | ORAL | Status: DC
  Administered 2018-10-16 – 2018-10-22 (×6): 325 mg
  Filled 2018-10-16 (×7): qty 1

## 2018-10-16 MED ORDER — HYDRALAZINE HCL 20 MG/ML IJ SOLN
5.0000 mg | Freq: Once | INTRAMUSCULAR | Status: AC
  Administered 2018-10-16: 5 mg via INTRAVENOUS
  Filled 2018-10-16: qty 1

## 2018-10-16 MED ORDER — ATORVASTATIN CALCIUM 40 MG PO TABS
40.0000 mg | ORAL_TABLET | Freq: Every day | ORAL | Status: DC
  Administered 2018-10-16 – 2018-10-26 (×11): 40 mg
  Filled 2018-10-16 (×12): qty 1

## 2018-10-16 MED ORDER — DEXTROSE 5 % IV SOLN
INTRAVENOUS | Status: DC
  Administered 2018-10-16 – 2018-10-18 (×5): via INTRAVENOUS

## 2018-10-16 MED ORDER — CARVEDILOL 3.125 MG PO TABS: 3.1250 mg | ORAL_TABLET | Freq: Two times a day (BID) | ORAL | Status: DC

## 2018-10-16 NOTE — Progress Notes (Addendum)
STROKE TEAM PROGRESS NOTE   [name] is a 58 y.o. male with history of CHF, HTN, smoker, alcohol abuse, homeless admitted for s/p fall. Found to have neuro decline, slurry speech, left sided weakness, hypoglycemia.   Stroke:  Multifocal small infarcts including right external capsule, right CR, left cerebellar, suspect intracranial stenosis in the setting of hypotension and hypoglycemia.   No antithrombotic prior to admission, now on ASA 300mg  PR.   Recommend aspirin 81 mg and clopidogrel 75 mg orally every day x 3 months once ok from GI standpoint (anemia, thrombocytopenia) for secondary stroke prevention given large vessel intracranial atherosclerosis After 3 months, change to plavix alone.   NOTHING FURTHER TO ADD FROM THE STROKE STANDPOINT  Patient has a 10-15% risk of having another stroke over the next year, the highest risk is within 2 weeks of the most recent stroke Ongoing risk factor control by Primary Care Physician  Stroke Service will sign off. Please call should any needs arise.  Follow-up Stroke Clinic at [facility] in 4 weeks, order placed.  Annie Main, MSN, APRN, ANVP-BC, AGPCNP-BC Advanced Practice Stroke Nurse [facility] See Amion for Schedule & Pager information 10/16/2018 2:12 PM

## 2018-10-16 NOTE — Progress Notes (Signed)
Physical Therapy Treatment Patient Details Name: Jacob Bass MRN: [id number] DOB: 01/30/1961 Today's Date: 10/16/2018    History of Present Illness Yeudiel Alfonse RasHam is an 58 y.o. male past medical history significant for hypertension, heart failure tobacco abuse and homeless presented with elevated troponins and question GI blood loss developed AMS and L side weakness after GI testing,  MRI with 13 mm acute infarct on the right external capsule and corona radiata, with several additional punctuated acute early subacute infarcts present bilaterally in basal ganglia and right anterior basal ganglia showed chronic hemorrhagic infarction and advanced microvascular ischemic changes.  GI team is advised is okay to resume antiplatelet therapy.    PT Comments    Patient progressing this session with sitting balance, able to transfer with assist of 1 with increased time and max cues for safety and L side awareness.  Remains impulsive and not fully aware of his deficits, but noted use of L UE for support at EOB and on chair with mod cues and assist for placement.  Feel appropriate for SNF level rehab.    Follow Up Recommendations  SNF;Supervision/Assistance - 24 hour     Equipment Recommendations  Other (comment)(TBA)    Recommendations for Other Services       Precautions / Restrictions Precautions Precautions: Fall    Mobility  Bed Mobility Overal bed mobility: Needs Assistance Bed Mobility: Rolling;Sidelying to Sit Rolling: Mod assist Sidelying to sit: Mod assist       General bed mobility comments: down in the bed and laying diagonal on bed so assist to roll and legs off bed and lift trunk, leaning L initially  Transfers Overall transfer level: Needs assistance   Transfers: Lateral/Scoot Transfers Sit to Stand: Mod assist        Lateral/Scoot Transfers: Mod assist General transfer comment: stood twice from EOB   Ambulation/Gait             General Gait Details:  unable   Stairs             Wheelchair Mobility    Modified Rankin (Stroke Patients Only) Modified Rankin (Stroke Patients Only) Pre-Morbid Rankin Score: Moderate disability(unknown baseline, pt homelss) Modified Rankin: Severe disability     Balance Overall balance assessment: Needs assistance Sitting-balance support: Feet supported;Single extremity supported Sitting balance-Leahy Scale: Poor Sitting balance - Comments: needs assist initially to place L foot so can balance, cues throughout to prevent LOB to L; while seated performed oral hygiene with swab and mouth cleanser and min A for balance during task     Standing balance-Leahy Scale: Zero Standing balance comment: unable to achieve upright standing with mod A, c/o R knee pain                            Cognition Arousal/Alertness: Awake/alert Behavior During Therapy: Impulsive Overall Cognitive Status: No family/caregiver present to determine baseline cognitive functioning Area of Impairment: Problem solving;Safety/judgement;Following commands                       Following Commands: Follows one step commands with increased time Safety/Judgement: Decreased awareness of deficits;Decreased awareness of safety   Problem Solving: Slow processing        Exercises      General Comments General comments (skin integrity, edema, etc.): urine in bed, but condom catheter attached, nurse tech aware and will change linens; noted wheezing and SpO2 88% on RA, RN aware  Pertinent Vitals/Pain Pain Assessment: No/denies pain Faces Pain Scale: Hurts little more Pain Location: R knee Pain Descriptors / Indicators: Discomfort;Grimacing Pain Intervention(s): Monitored during session;Repositioned;Limited activity within patient's tolerance    Home Living                      Prior Function            PT Goals (current goals can now be found in the care plan section) Progress  towards PT goals: Progressing toward goals    Frequency    Min 3X/week      PT Plan Current plan remains appropriate    Co-evaluation              AM-PAC PT "6 Clicks" Mobility   Outcome Measure  Help needed turning from your back to your side while in a flat bed without using bedrails?: A Lot Help needed moving from lying on your back to sitting on the side of a flat bed without using bedrails?: A Lot Help needed moving to and from a bed to a chair (including a wheelchair)?: A Lot Help needed standing up from a chair using your arms (e.g., wheelchair or bedside chair)?: Total Help needed to walk in hospital room?: Total Help needed climbing 3-5 steps with a railing? : Total 6 Click Score: 9    End of Session Equipment Utilized During Treatment: Gait belt Activity Tolerance: Patient limited by pain Patient left: with call bell/phone within reach;in chair;with chair alarm set Nurse Communication: Mobility status;Other (comment)(SpO2 88% with wheezing) PT Visit Diagnosis: Hemiplegia and hemiparesis;Other abnormalities of gait and mobility (R26.89) Hemiplegia - Right/Left: Left Hemiplegia - dominant/non-dominant: Non-dominant Hemiplegia - caused by: Cerebral infarction     Time: 1150-1220 PT Time Calculation (min) (ACUTE ONLY): 30 min  Charges:  $Therapeutic Activity: 23-37 mins                     Sheran Lawless, [place] [facility] [phone] 10/16/2018    Elray Mcgregor 10/16/2018, 1:22 PM

## 2018-10-16 NOTE — Progress Notes (Signed)
Modified Barium Swallow Progress Note  Patient Details  Name: Jacob Bass MRN: [id number] Date of Birth: 06/23/1961  Today's Date: 10/16/2018  Modified Barium Swallow completed.  Full report located under Chart Review in the Imaging Section.  Brief recommendations include the following:  Clinical Impression  Pt presents with a primary oral dysphagia with marked sensory loss on left, leading to residue in left lateral sulcus, spillage from left side of mouth, and no awareness of deficits.  There is impaired oral containment and bolus propulsion.  However, once bolus material reaches the pharynx, a swallow is triggered and there is reliable laryngeal vestibule closure.  There is mild vallecular residue of purees.  No aspiration nor penetration observed.  Testing was not ideal - pt had difficulty attending to instructions and required constant cues to remain adequately positioned.  For today, recommend initiating a dysphagia 1 diet with thin liquids; meds should be crushed and given with applesauce.  D/C cortrak.  D/W Dr. Radonna Ricker.  SLP will follow for education, safety.    Swallow Evaluation Recommendations       SLP Diet Recommendations: Dysphagia 1 (Puree) solids;Thin liquid   Liquid Administration via: Cup;Straw   Medication Administration: Crushed with puree   Supervision: Full supervision/cueing for compensatory strategies   Compensations: Minimize environmental distractions;Lingual sweep for clearance of pocketing       Oral Care Recommendations: Oral care BID      [name], MA CCC/SLP Acute Rehabilitation Services Office number [phone] Pager [phone]   [name] 10/16/2018,10:45 AM

## 2018-10-16 NOTE — Progress Notes (Signed)
[facility] notified blood pressure 192/86 HR 76 NPO HX of ETOH abuse CIWA score 4. Ilean Skill LPN

## 2018-10-16 NOTE — Plan of Care (Signed)

## 2018-10-16 NOTE — Progress Notes (Signed)
[facility] PROGRESS NOTE    Progress Note  Jacob Bass  TZG:[id number] DOB: Feb 28, 1961 DOA: 10/09/2018 PCP: Patient, No Pcp Per     Brief Narrative:   Jacob Bass is an 58 y.o. male past medical history significant for hypertension, heart failure tobacco abuse and homeless presents to the hospital after slipping and falling in the rain.  To the hospital his troponins were minimally elevated, cardiology is cleared the patient due to his anemia GI was consulted who proceeded with an EGD and colonoscopy.  The patient mental status continued to deteriorate and was noted to have a left upper extremity weakness with a facial droop and MRI of the brain was done that revealed 13 mm acute infarct on the right external capsule and corona radiata, with several additional punctuated acute early subacute infarcts present bilaterally in basal ganglia and right anterior basal ganglia showed chronic hemorrhagic infarction and advanced microvascular ischemic changes.  GI team is advised is okay to resume antiplatelet therapy.  Assessment/Plan:   Acute encephalopathy likely due to acute external capsule and basal ganglia infarct: MRI of the brain showed results as below. Neurology was consulted.  They recommended to start aspirin 300 mg. 2D echo was done that showed EF of 60 to 65%. Currently on a statin. Swallowing evaluation for recommendation of diet is pending. GI has no opposition to starting aspirin. Occupational therapy recommended skilled nursing facility temporarily  Metabolic encephalopathy: Likely multifactorial in the setting of volume depletion stroke alcohol liver cirrhosis and recurrent hypoglycemia.  Continue management of care and underlying processes. Patient is more awake seems back to baseline, no complaints except for he wants something to eat.  Episodes hypoglycemia: Likely due to decreased oral intake. He was started on D5, A1c is 5.0. Currently n.p.o.  Macrocytic  anemia: Multifactorial likely due to alcohol and possible B12 deficiency. GI agreed with antiplatelet therapy.  They would like to hold on on bowel prep until mental status is improved.  They recommended to get it as an inpatient as patient is currently homeless.  Elevated troponins: Setting of demand ischemia cardiology was consulted recommended no further work-up.  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 Will need follow-up with GI as an outpatient.  Alcohol abuse: Continue Ativan protocol.  Status post fall: This is probably due to CVA. Physical therapy and occupational therapy evaluated the patient and recommended 24-hour supervision versus skilled nursing facility.  Essential hypertension: Pressure has remained stable continue lisinopril and metoprolol will need further titration as an outpatient as tolerated.  Chronic thrombocytopenia: Likely due to alcohol abuse.  Tobacco abuse: Counseling.  Choledocholithiasis: Follow-up as an outpatient.    New mild hypernatremia: Change his fluids to D5W.  DVT prophylaxis:Heparin Family Communication:none Disposition Plan/Barrier to D/C: unable to determine Code Status:     Code Status Orders  (From admission, onward)         Start     Ordered   10/09/18 2345  Full code  Continuous     10/09/18 2344        Code Status History    This patient has a current code status but no historical code status.        IV Access:    Peripheral IV   Procedures and diagnostic studies:   Dg Abd 1 View  Result Date: 10/14/2018 CLINICAL DATA:  Feeding tube placement. EXAM: ABDOMEN - 1 VIEW COMPARISON:  None.  FINDINGS: The long GI feeding tube tip is in the third portion of the duodenum. IMPRESSION: Feeding tube tip is in the third portion of the duodenum. Electronically Signed   By: Rudie Meyer M.D.   On: 10/14/2018 17:30      Medical Consultants:    None.  Anti-Infectives:   None  Subjective:    Jacob Bass patient has no new complaints except for he is sleepy.  He is hungry and would like to eat.  Objective:    Vitals:   10/16/18 0100 10/16/18 0321 10/16/18 0530 10/16/18 0741  BP:  (!) 192/86 (!) 162/104 (!) 188/88  Pulse: (!) 0 76 96 83  Resp:  (!) 22  (!) 24  Temp:  98.9 F (37.2 C)  98.9 F (37.2 C)  TempSrc:  Oral  Oral  SpO2:  98%  100%  Weight:      Height:        Intake/Output Summary (Last 24 hours) at 10/16/2018 0854 Last data filed at 10/16/2018 0600 Gross per 24 hour  Intake 3931.7 ml  Output 1225 ml  Net 2706.7 ml   Filed Weights   10/11/18 0558 10/12/18 0521 10/13/18 0700  Weight: 64 kg 63.5 kg 62.1 kg    Exam: General exam: In no acute distress. Respiratory system: Good air movement and clear to auscultation. Cardiovascular system: Regular rate and rhythm with positive S1-S2 Gastrointestinal system: Abdomen is nondistended, soft and nontender.  Central nervous system: Alert and oriented. No focal neurological deficits. Extremities: No pedal edema. Skin: No rashes, lesions or ulcers    Data Reviewed:    Labs: Basic Metabolic Panel: Recent Labs  Lab 10/09/18 1315 10/09/18 1455 10/13/18 1209 10/14/18 0459 10/15/18 0538 10/16/18 0548  NA 139  --  143 144 145 146*  K 4.3  --  3.7 3.6 3.4* 3.5  CL 101  --  115* 114* 118* 118*  CO2 23  --  23 24 22  18*  GLUCOSE 96  --  85 106* 119* 133*  BUN 34*  --  8 7 6 6   CREATININE 0.99  --  0.78 0.85 0.93 0.87  CALCIUM 8.3*  --  7.8* 7.9* 7.4* 7.7*  MG  --  1.9  --  1.8  --   --    GFR Estimated Creatinine Clearance: 73.6 mL/min (by C-G formula based on SCr of 0.87 mg/dL). Liver Function Tests: Recent Labs  Lab 10/09/18 1315 10/11/18 0604 10/13/18 1209  AST 133* 95* 77*  ALT 76* 63* 63*  ALKPHOS 76 65 63  BILITOT 2.0* 1.2 1.5*  PROT 6.7 5.4* 5.8*  ALBUMIN 2.5* 2.0* 2.2*   Recent Labs  Lab  10/09/18 1315  LIPASE 30   Recent Labs  Lab 10/13/18 1209 10/14/18 1222  AMMONIA 46* 22   Coagulation profile Recent Labs  Lab 10/11/18 1242 10/13/18 1209  INR 1.23 1.33    CBC: Recent Labs  Lab 10/09/18 1315  10/11/18 0604 10/13/18 0517 10/14/18 0459 10/15/18 0538 10/16/18 0548  WBC 9.6   < > 5.4 6.9 7.9 6.3 8.7  NEUTROABS 6.6  --   --   --   --   --   --   HGB 10.3*   < > 8.3* 9.0* 9.9* 8.3* 9.0*  HCT 31.1*   < > 24.4* 27.1* 29.2* 25.6* 26.4*  MCV 100.6*   < > 100.4* 100.4* 102.1* 102.8* 102.3*  PLT 100*   < > 99* 121* 136* 133* 148*   < > =  values in this interval not displayed.   Cardiac Enzymes: Recent Labs  Lab 10/09/18 1725 10/09/18 1947 10/09/18 2356 10/10/18 0313 10/10/18 0520  TROPONINI 0.04* 0.05* 0.05* 0.04* 0.04*   BNP (last 3 results) No results for input(s): PROBNP in the last 8760 hours. CBG: Recent Labs  Lab 10/15/18 0741 10/15/18 1135 10/15/18 1620 10/15/18 2302 10/16/18 0318  GLUCAP 94 115* 114* 133* 125*   D-Dimer: No results for input(s): DDIMER in the last 72 hours. Hgb A1c: Recent Labs    10/13/18 1209  HGBA1C 5.0   Lipid Profile: No results for input(s): CHOL, HDL, LDLCALC, TRIG, CHOLHDL, LDLDIRECT in the last 72 hours. Thyroid function studies: No results for input(s): TSH, T4TOTAL, T3FREE, THYROIDAB in the last 72 hours.  Invalid input(s): FREET3 Anemia work up: No results for input(s): VITAMINB12, FOLATE, FERRITIN, TIBC, IRON, RETICCTPCT in the last 72 hours. Sepsis Labs: Recent Labs  Lab 10/13/18 0517 10/13/18 2334 10/14/18 0459 10/15/18 0538 10/16/18 0548  PROCALCITON  --  <0.10  --   --   --   WBC 6.9  --  7.9 6.3 8.7   Microbiology Recent Results (from the past 240 hour(s))  Culture, blood (routine x 2)     Status: None (Preliminary result)   Collection Time: 10/13/18  7:59 PM  Result Value Ref Range Status   Specimen Description BLOOD LEFT HAND  Final   Special Requests   Final    BOTTLES DRAWN  AEROBIC AND ANAEROBIC Blood Culture adequate volume   Culture   Final    NO GROWTH 3 DAYS Performed at [facility], [address] [place], [place] [address]    Report Status PENDING  Incomplete  Culture, blood (routine x 2)     Status: None (Preliminary result)   Collection Time: 10/13/18  7:59 PM  Result Value Ref Range Status   Specimen Description BLOOD RIGHT HAND  Final   Special Requests   Final    BOTTLES DRAWN AEROBIC ONLY Blood Culture adequate volume   Culture   Final    NO GROWTH 3 DAYS Performed at [facility], [address] [place], [place] [address]    Report Status PENDING  Incomplete  Culture, Urine     Status: Abnormal   Collection Time: 10/14/18 12:41 AM  Result Value Ref Range Status   Specimen Description URINE, CLEAN CATCH  Final   Special Requests   Final    NONE Performed at [facility], [address] [place], [place] [address]    Culture MULTIPLE SPECIES PRESENT, SUGGEST RECOLLECTION (A)  Final   Report Status 10/16/2018 FINAL  Final     Medications:   . aspirin  300 mg Rectal Daily  . feeding supplement (PRO-STAT SUGAR FREE 64)  30 mL Per Tube Daily  . folic acid  1 mg Oral Daily  . haloperidol lactate  5 mg Intravenous Once  . heparin injection (subcutaneous)  5,000 Units Subcutaneous Q8H  . lisinopril  10 mg Oral Daily  . metoprolol tartrate  25 mg Per Tube BID  . multivitamin with minerals  1 tablet Oral Daily  . pantoprazole  40 mg Oral Daily  . polyethylene glycol  4,000 mL Oral Once  . thiamine  100 mg Oral Daily   Continuous Infusions: . dextrose 5 % and 0.9% NaCl 150 mL/hr at 10/16/18 0345  . feeding supplement (JEVITY 1.2 CAL) 1,000 mL (10/16/18 0341)     LOS: 5 days  Marinda Elk  [facility]  10/16/2018, 8:54 AM

## 2018-10-17 LAB — BASIC METABOLIC PANEL
Anion gap: 3 — ABNORMAL LOW (ref 5–15)
BUN: 8 mg/dL (ref 6–20)
CO2: 22 mmol/L (ref 22–32)
Calcium: 7.7 mg/dL — ABNORMAL LOW (ref 8.9–10.3)
Chloride: 118 mmol/L — ABNORMAL HIGH (ref 98–111)
Creatinine, Ser: 0.85 mg/dL (ref 0.61–1.24)
GFR calc Af Amer: >60 mL/min (ref >60)
GFR calc non Af Amer: >60 mL/min (ref >60)
Glucose, Bld: 99 mg/dL (ref 70–99)
Potassium: 3.4 mmol/L — ABNORMAL LOW (ref 3.5–5.1)
Sodium: 143 mmol/L (ref 135–145)

## 2018-10-17 LAB — GLUCOSE, CAPILLARY
GLUCOSE-CAPILLARY: 183 mg/dL — AB (ref 70–99)
GLUCOSE-CAPILLARY: 73 mg/dL (ref 70–99)
Glucose-Capillary: 87 mg/dL (ref 70–99)
Glucose-Capillary: 119 mg/dL — ABNORMAL HIGH (ref 70–99)
Glucose-Capillary: 119 mg/dL — ABNORMAL HIGH (ref 70–99)
Glucose-Capillary: 123 mg/dL — ABNORMAL HIGH (ref 70–99)
Glucose-Capillary: 82 mg/dL (ref 70–99)
Glucose-Capillary: 92 mg/dL (ref 70–99)

## 2018-10-17 MED ORDER — POTASSIUM CHLORIDE 20 MEQ/15ML (10%) PO SOLN
40.0000 meq | Freq: Two times a day (BID) | ORAL | Status: AC
  Administered 2018-10-17 (×2): 40 meq via ORAL
  Filled 2018-10-17 (×2): qty 30

## 2018-10-17 MED ORDER — PEG-KCL-NACL-NASULF-NA ASC-C 100 G PO SOLR: 1.0000 | Freq: Once | ORAL | Status: DC

## 2018-10-17 MED ORDER — PRO-STAT SUGAR FREE PO LIQD
30.0000 mL | Freq: Two times a day (BID) | ORAL | Status: DC
  Administered 2018-10-17 – 2018-10-27 (×18): 30 mL via ORAL
  Filled 2018-10-17 (×17): qty 30

## 2018-10-17 MED ORDER — PEG 3350-KCL-NABCB-NACL-NASULF 236 G PO SOLR: 2000.0000 mL | Freq: Once | ORAL | Status: DC

## 2018-10-17 MED ORDER — SODIUM CHLORIDE 0.9 % IV SOLN: INTRAVENOUS | Status: DC

## 2018-10-17 MED ORDER — POLYETHYLENE GLYCOL 3350 17 GM/SCOOP PO POWD
1.0000 | Freq: Once | ORAL | Status: DC
  Filled 2018-10-17: qty 255

## 2018-10-17 MED ORDER — POTASSIUM CHLORIDE CRYS ER 20 MEQ PO TBCR: 40.0000 meq | EXTENDED_RELEASE_TABLET | Freq: Two times a day (BID) | ORAL | Status: DC

## 2018-10-17 MED ORDER — PEG 3350-KCL-NABCB-NACL-NASULF 236 G PO SOLR
4000.0000 mL | Freq: Once | ORAL | Status: DC
  Filled 2018-10-17: qty 4000

## 2018-10-17 MED ORDER — PEG 3350-KCL-NABCB-NACL-NASULF 236 G PO SOLR
2000.0000 mL | Freq: Once | ORAL | Status: DC
  Filled 2018-10-17 (×3): qty 4000

## 2018-10-17 MED ORDER — PEG 3350-KCL-NABCB-NACL-NASULF 236 G PO SOLR: 4000.0000 mL | Freq: Once | ORAL | Status: DC

## 2018-10-17 NOTE — Progress Notes (Signed)
Nutrition Follow-up  DOCUMENTATION CODES:   Not applicable  INTERVENTION:  Provide 30 ml Prostat po BID, each supplement provides 100 kcal and 15 grams of protein.   Encourage adequate PO intake.   NUTRITION DIAGNOSIS:   Inadequate oral intake related to inability to eat, dysphagia as evidenced by NPO status; diet advanced, improved  GOAL:   Patient will meet greater than or equal to 90% of their needs; progressing  MONITOR:   PO intake, Supplement acceptance, Diet advancement, Weight trends, Labs, Skin, I & O's  REASON FOR ASSESSMENT:   Consult Enteral/tube feeding initiation and management  ASSESSMENT:   59 y.o. male with past medical history significant for hypertension, CHF, tobacco abuse and homelessness. Presents after fall.  MRI brain revealed 13 mm acute/early subacute infarction within right external capsule and corona radiata, several additional punctate acute/early subacute infarcts present bilateral basal ganglia, right anterior basal ganglia chronic hemorrhagic infarction and advanced chronic microvascular ischemic changes and moderate volume loss of the brain.    Diet advanced to a dysphagia 1 diet with thin liquids yesterday. Cortrak NGT removed and tube feeding discontinued 2/13. Mentation has improved. Pt with heme positive stool. Plans for EGD and colonoscopy tomorrow. Diet has been changed to a clear liquid diet and will be NPO at midnight. RD to order Prostat to aid increased protein needs. Labs and medications reviewed.   Diet Order:   Diet Order            Diet NPO time specified  Diet effective midnight        Diet clear liquid Room service appropriate? Yes; Fluid consistency: Thin  Diet effective now              EDUCATION NEEDS:   Not appropriate for education at this time  Skin:  Skin Assessment: Reviewed RN Assessment  Last BM:  2/13  Height:   Ht Readings from Last 1 Encounters:  10/09/18 5\' 3"  (1.6 m)    Weight:   Wt Readings  from Last 1 Encounters:  10/17/18 66.7 kg    Ideal Body Weight:  56.36 kg  BMI:  Body mass index is 26.04 kg/m.  Estimated Nutritional Needs:   Kcal:  1700-1900  Protein:  80-95 grams  Fluid:  1.7 - 1.9 L/day    Roslyn Smiling, MS, RD, LDN Pager # [phone] After hours/ weekend pager # [phone]

## 2018-10-17 NOTE — Progress Notes (Signed)
Patient refused bowel prep after having a long conversation with him about the importance of the prep. MD made aware.

## 2018-10-17 NOTE — Progress Notes (Signed)
Occupational Therapy Treatment Patient Details Name: Jacob Bass MRN: [id number] DOB: Nov 17, 1960 Today's Date: 10/17/2018    History of present illness Jacob Bass is an 58 y.o. male past medical history significant for hypertension, heart failure tobacco abuse and homeless presented with elevated troponins and question GI blood loss developed AMS and L side weakness after GI testing,  MRI with 13 mm acute infarct on the right external capsule and corona radiata, with several additional punctuated acute early subacute infarcts present bilaterally in basal ganglia and right anterior basal ganglia showed chronic hemorrhagic infarction and advanced microvascular ischemic changes.  GI team is advised is okay to resume antiplatelet therapy.   OT comments  Pt. Seen for skilled OT.  Able to complete grooming tasks with min guard assist.  Bed level in seated position.   Follow Up Recommendations  SNF    Equipment Recommendations  3 in 1 bedside commode    Recommendations for Other Services      Precautions / Restrictions Precautions Precautions: Fall       Mobility Bed Mobility Overal bed mobility: Needs Assistance             General bed mobility comments: pt. able to assist with scooting up in bed for better positioning. found leanding to R side and was not initiating correction.   Transfers                      Balance                                           ADL either performed or assessed with clinical judgement   ADL Overall ADL's : Needs assistance/impaired     Grooming: Wash/dry hands;Wash/dry face;Min guard Grooming Details (indicate cue type and reason): bed level                                      Vision       Perception     Praxis      Cognition Arousal/Alertness: Awake/alert Behavior During Therapy: WFL for tasks assessed/performed Overall Cognitive Status: Within Functional Limits for tasks assessed                                           Exercises     Shoulder Instructions       General Comments      Pertinent Vitals/ Pain       Pain Assessment: No/denies pain  Home Living                                          Prior Functioning/Environment              Frequency  Min 2X/week        Progress Toward Goals  OT Goals(current goals can now be found in the care plan section)  Progress towards OT goals: Progressing toward goals     Plan      Co-evaluation                 AM-PAC OT "6  Clicks" Daily Activity     Outcome Measure   Help from another person eating meals?: A Lot Help from another person taking care of personal grooming?: A Lot Help from another person toileting, which includes using toliet, bedpan, or urinal?: A Lot Help from another person bathing (including washing, rinsing, drying)?: A Lot Help from another person to put on and taking off regular upper body clothing?: A Lot Help from another person to put on and taking off regular lower body clothing?: A Lot 6 Click Score: 12    End of Session    OT Visit Diagnosis: Unsteadiness on feet (R26.81);Muscle weakness (generalized) (M62.81)   Activity Tolerance Patient tolerated treatment well   Patient Left in bed;with call bell/phone within reach   Nurse Communication          Time: 1202-1210 OT Time Calculation (min): 8 min  Charges: OT General Charges $OT Visit: 1 Visit OT Treatments $Self Care/Home Management : 8-22 mins   Robet Leu, COTA/L 10/17/2018, 1:19 PM

## 2018-10-17 NOTE — Progress Notes (Signed)
CSW received call from [employer], as they are attempting to assist patient with Medicaid and Disability if appropriate, that the patient has no phone numbers for any family members listed. Per Artist, patient says he has a niece named Coolidge Breeze but does not know her number, and no number is listed within the chart. CSW to attempt to try to locate family to assist with patient's disposition and care.  CSW to follow.  Blenda Nicely, [place] Clinical Social Worker [phone]

## 2018-10-17 NOTE — Progress Notes (Signed)
[facility] PROGRESS NOTE    Progress Note  Jacob Bass  ZOX:[id number] DOB: 03/22/1961 DOA: 10/09/2018 PCP: Patient, No Pcp Per     Brief Narrative:   Jacob Bass is an 58 y.o. male past medical history significant for hypertension, heart failure tobacco abuse and homeless presents to the hospital after slipping and falling in the rain.  To the hospital his troponins were minimally elevated, cardiology is cleared the patient due to his anemia GI was consulted who proceeded with an EGD and colonoscopy.  The patient mental status continued to deteriorate and was noted to have a left upper extremity weakness with a facial droop and MRI of the brain was done that revealed 13 mm acute infarct on the right external capsule and corona radiata, with several additional punctuated acute early subacute infarcts present bilaterally in basal ganglia and right anterior basal ganglia showed chronic hemorrhagic infarction and advanced microvascular ischemic changes.  GI team is advised is okay to resume antiplatelet therapy.  Assessment/Plan:   Acute encephalopathy likely due to acute external capsule and basal ganglia infarct: MRI of the brain showed results as below. Neurology was consulted.  They recommended to start aspirin 300 mg. 2D echo was done that showed EF of 60 to 65%. Currently on a statin. He will go home on aspirin and Plavix for 3 months after this he will continue Plavix indefinitely.  Metabolic encephalopathy: Likely multifactorial in the setting of volume depletion stroke alcohol liver cirrhosis and recurrent hypoglycemia.  Continue management of care and underlying processes. Patient is more awake seems back to baseline, no complaints except for he wants something to eat.  Episodes hypoglycemia: Likely due to decreased oral intake. Currently n.p.o.  Macrocytic anemia: Multifactorial likely due to alcohol and possible B12 deficiency. GI agreed with antiplatelet therapy.    Hold Plavix place him n.p.o. awaiting GI recommendations.  Elevated troponins: Setting of demand ischemia cardiology was consulted recommended no further work-up.  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 Will need follow-up with GI as an outpatient.  Alcohol abuse: Continue Ativan protocol.  Status post fall: This is probably due to CVA. Physical therapy and occupational therapy evaluated the patient and recommended 24-hour supervision versus skilled nursing facility.  Essential hypertension: Pressure has remained stable continue lisinopril and metoprolol will need further titration as an outpatient as tolerated.  Chronic thrombocytopenia: Likely due to alcohol abuse.  Tobacco abuse: Counseling.  Choledocholithiasis: Follow-up as an outpatient.    New mild hypernatremia: Resolved continue D5 while n.p.o.  DVT prophylaxis:Heparin Family Communication:none Disposition Plan/Barrier to D/C: unable to determine Code Status:     Code Status Orders  (From admission, onward)         Start     Ordered   10/09/18 2345  Full code  Continuous     10/09/18 2344        Code Status History    This patient has a current code status but no historical code status.        IV Access:    Peripheral IV   Procedures and diagnostic studies:   Dg Swallowing Func-speech Pathology  Result Date: 10/16/2018 Objective Swallowing Evaluation: Type of Study: MBS-Modified Barium Swallow Study  Patient Details Name: Jacob Bass MRN: [id number] Date of Birth: [birth year] Today's Date: 10/16/2018 Time: SLP Start Time (ACUTE ONLY): 1000 -SLP Stop Time (ACUTE ONLY): 1030 SLP Time Calculation (min) (ACUTE ONLY): 30  min Past Medical History: Past Medical History: Diagnosis Date . CHF (congestive heart failure) (HCC)  . Hypertension  Past Surgical History: No past surgical history on file. HPI:  58 y.o. male with past medical history significant for hypertension, tobacco abuse, and homelessness; who presented 2/8 after slipping and falling in the rain.  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   On 2/9. pt presented with neuro changes with left sided weakness and dysarthria due to actue right ext capsule/basal ganglia/corona radiata CVA. EGD/colonscopy pending, but on hold due to changes in MS. Cortrak placed in IR 2/11. MRI 2/9: 13 mm acute/early subacute infarction within right external capsule and corona radiata. Several additional punctate acute/early subacute infarcts are present in bilateral basal ganglia. 3. 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 Advanced chronic microvascular ischemic changes and moderate volume loss of the brain.  Subjective: alert Assessment / Plan / Recommendation CHL IP CLINICAL IMPRESSIONS 10/16/2018 Clinical Impression Pt presents with a primary oral dysphagia with marked sensory loss on left, leading to residue in left lateral sulcus, spillage from left side of mouth, and no awareness of deficits.  There is impaired oral containment and bolus propulsion.  However, once bolus material reaches the pharynx, a swallow is triggered and there is reliable laryngeal vestibule closure.  There is mild vallecular residue of purees.  No aspiration nor penetration observed.  Testing was not ideal - pt had difficulty attending to instructions and required constant cues to remain adequately positioned.  For today, recommend initiating a dysphagia 1 diet with thin liquids; meds should be crushed and given with applesauce.  D/C cortrak.  D/W Dr. Radonna Ricker.  SLP will follow for education, safety.  SLP Visit Diagnosis Dysphagia, oral phase (R13.11) Attention and concentration deficit following -- Frontal lobe and executive function deficit following -- Impact on safety and function --   CH[facility] IP TREATMENT RECOMMENDATION 10/16/2018 Treatment  Recommendations Therapy as outlined in treatment plan below   No flowsheet data found. CHL IP DIET RECOMMENDATION 10/16/2018 SLP Diet Recommendations Dysphagia 1 (Puree) solids;Thin liquid Liquid Administration via Cup;Straw Medication Administration Crushed with puree Compensations Minimize environmental distractions;Lingual sweep for clearance of pocketing Postural Changes --   CHL IP OTHER RECOMMENDATIONS 10/16/2018 Recommended Consults -- Oral Care Recommendations Oral care BID Other Recommendations --   [facility] IP FOLLOW UP RECOMMENDATIONS 10/16/2018 Follow up Recommendations Skilled Nursing facility   [facility] IP FREQUENCY AND DURATION 10/16/2018 Speech Therapy Frequency (ACUTE ONLY) min 3x week Treatment Duration 1 week      CHL IP ORAL PHASE 10/16/2018 Oral Phase Impaired Oral - Pudding Teaspoon -- Oral - Pudding Cup -- Oral - Honey Teaspoon -- Oral - Honey Cup -- Oral - Nectar Teaspoon -- Oral - Nectar Cup -- Oral - Nectar Straw -- Oral - Thin Teaspoon Weak lingual manipulation;Reduced posterior propulsion;Left pocketing in lateral sulci;Lingual/palatal residue;Delayed oral transit;Decreased bolus cohesion;Left anterior bolus loss Oral - Thin Cup Left anterior bolus loss;Weak lingual manipulation;Left pocketing in lateral sulci;Delayed oral transit;Decreased bolus cohesion Oral - Thin Straw Left anterior bolus loss;Weak lingual manipulation;Reduced posterior propulsion;Left pocketing in lateral sulci;Delayed oral transit;Decreased bolus cohesion Oral - Puree Weak lingual manipulation;Holding of bolus;Left pocketing in lateral sulci;Delayed oral transit;Decreased bolus cohesion Oral - Mech Soft -- Oral - Regular -- Oral - Multi-Consistency -- Oral - Pill -- Oral Phase - Comment --  CHL IP PHARYNGEAL PHASE 10/16/2018 Pharyngeal Phase Impaired Pharyngeal- Pudding Teaspoon -- Pharyngeal -- Pharyngeal- Pudding Cup -- Pharyngeal -- Pharyngeal- Honey Teaspoon --  Pharyngeal -- Pharyngeal- Honey Cup -- Pharyngeal -- Pharyngeal-  Nectar Teaspoon -- Pharyngeal -- Pharyngeal- Nectar Cup -- Pharyngeal -- Pharyngeal- Nectar Straw -- Pharyngeal -- Pharyngeal- Thin Teaspoon -- Pharyngeal -- Pharyngeal- Thin Cup -- Pharyngeal -- Pharyngeal- Thin Straw -- Pharyngeal -- Pharyngeal- Puree Pharyngeal residue - valleculae Pharyngeal -- Pharyngeal- Mechanical Soft -- Pharyngeal -- Pharyngeal- Regular -- Pharyngeal -- Pharyngeal- Multi-consistency -- Pharyngeal -- Pharyngeal- Pill -- Pharyngeal -- Pharyngeal Comment --  No flowsheet data found. [name], Amanda [name] 10/16/2018, 10:44 AM                Medical Consultants:    None.  Anti-Infectives:   None  Subjective:    Jacob Bass has no new complaints.  Objective:    Vitals:   10/16/18 2045 10/17/18 0010 10/17/18 0500 10/17/18 0524  BP: (!) 151/70 (!) 166/106  (!) 143/76  Pulse: 69 70  67  Resp: 18 (!) 24  (!) 22  Temp: 99.5 F (37.5 C) 98 F (36.7 C)  99.7 F (37.6 C)  TempSrc: Oral Oral  Oral  SpO2: 100% 100%  100%  Weight:   66.7 kg   Height:        Intake/Output Summary (Last 24 hours) at 10/17/2018 1043 Last data filed at 10/17/2018 0300 Gross per 24 hour  Intake 1280.44 ml  Output -  Net 1280.44 ml   Filed Weights   10/12/18 0521 10/13/18 0700 10/17/18 0500  Weight: 63.5 kg 62.1 kg 66.7 kg    Exam: General exam: In no acute distress. Respiratory system: Good air movement and clear to auscultation. Cardiovascular system: Regular rate and rhythm with positive S1-S2 Gastrointestinal system: Abdomen is nondistended, soft and nontender.  Central nervous system: Alert and oriented. No focal neurological deficits. Extremities: No pedal edema. Skin: No rashes, lesions or ulcers    Data Reviewed:    Labs: Basic Metabolic Panel: Recent Labs  Lab 10/13/18 1209 10/14/18 0459 10/15/18 0538 10/16/18 0548 10/17/18 0613  NA 143 144 145 146* 143  K 3.7 3.6 3.4* 3.5 3.4*  CL 115* 114* 118* 118* 118*  CO2 23 24 22  18* 22  GLUCOSE 85 106* 119*  133* 99  BUN 8 7 6 6 8   CREATININE 0.78 0.85 0.93 0.87 0.85  CALCIUM 7.8* 7.9* 7.4* 7.7* 7.7*  MG  --  1.8  --   --   --    GFR Estimated Creatinine Clearance: 75.3 mL/min (by C-G formula based on SCr of 0.85 mg/dL). Liver Function Tests: Recent Labs  Lab 10/11/18 0604 10/13/18 1209  AST 95* 77*  ALT 63* 63*  ALKPHOS 65 63  BILITOT 1.2 1.5*  PROT 5.4* 5.8*  ALBUMIN 2.0* 2.2*   No results for input(s): LIPASE, AMYLASE in the last 168 hours. Recent Labs  Lab 10/13/18 1209 10/14/18 1222  AMMONIA 46* 22   Coagulation profile Recent Labs  Lab 10/11/18 1242 10/13/18 1209  INR 1.23 1.33    CBC: Recent Labs  Lab 10/11/18 0604 10/13/18 0517 10/14/18 0459 10/15/18 0538 10/16/18 0548  WBC 5.4 6.9 7.9 6.3 8.7  HGB 8.3* 9.0* 9.9* 8.3* 9.0*  HCT 24.4* 27.1* 29.2* 25.6* 26.4*  MCV 100.4* 100.4* 102.1* 102.8* 102.3*  PLT 99* 121* 136* 133* 148*   Cardiac Enzymes: No results for input(s): CKTOTAL, CKMB, CKMBINDEX, TROPONINI in the last 168 hours. BNP (last 3 results) No results for input(s): PROBNP in the last 8760 hours. CBG: Recent Labs  Lab 10/16/18 2344 10/17/18 0021 10/17/18 0331 10/17/18  0617 10/17/18 0850  GLUCAP 68* 73 183* 87 119*   D-Dimer: No results for input(s): DDIMER in the last 72 hours. Hgb A1c: No results for input(s): HGBA1C in the last 72 hours. Lipid Profile: No results for input(s): CHOL, HDL, LDLCALC, TRIG, CHOLHDL, LDLDIRECT in the last 72 hours. Thyroid function studies: No results for input(s): TSH, T4TOTAL, T3FREE, THYROIDAB in the last 72 hours.  Invalid input(s): FREET3 Anemia work up: No results for input(s): VITAMINB12, FOLATE, FERRITIN, TIBC, IRON, RETICCTPCT in the last 72 hours. Sepsis Labs: Recent Labs  Lab 10/13/18 0517 10/13/18 2334 10/14/18 0459 10/15/18 0538 10/16/18 0548  PROCALCITON  --  <0.10  --   --   --   WBC 6.9  --  7.9 6.3 8.7   Microbiology Recent Results (from the past 240 hour(s))  Culture, blood  (routine x 2)     Status: None (Preliminary result)   Collection Time: 10/13/18  7:59 PM  Result Value Ref Range Status   Specimen Description BLOOD LEFT HAND  Final   Special Requests   Final    BOTTLES DRAWN AEROBIC AND ANAEROBIC Blood Culture adequate volume   Culture   Final    NO GROWTH 4 DAYS Performed at [facility], [address] [place], [place] [address]    Report Status PENDING  Incomplete  Culture, blood (routine x 2)     Status: None (Preliminary result)   Collection Time: 10/13/18  7:59 PM  Result Value Ref Range Status   Specimen Description BLOOD RIGHT HAND  Final   Special Requests   Final    BOTTLES DRAWN AEROBIC ONLY Blood Culture adequate volume   Culture   Final    NO GROWTH 4 DAYS Performed at [facility], [address], [place], [place] [address]    Report Status PENDING  Incomplete  Culture, Urine     Status: Abnormal   Collection Time: 10/14/18 12:41 AM  Result Value Ref Range Status   Specimen Description URINE, CLEAN CATCH  Final   Special Requests   Final    NONE Performed at [facility], [address] [place], [place] [address]    Culture MULTIPLE SPECIES PRESENT, SUGGEST RECOLLECTION (A)  Final   Report Status 10/16/2018 FINAL  Final     Medications:   . amLODipine  2.5 mg Oral Daily  . aspirin  325 mg Per Tube Daily  . atorvastatin  40 mg Per Tube q1800  . feeding supplement (PRO-STAT SUGAR FREE 64)  30 mL Per Tube Daily  . folic acid  1 mg Oral Daily  . heparin injection (subcutaneous)  5,000 Units Subcutaneous Q8H  . lisinopril  40 mg Oral Daily  . metoprolol tartrate  25 mg Per Tube BID  . multivitamin with minerals  1 tablet Oral Daily  . pantoprazole  40 mg Oral Daily  . polyethylene glycol  4,000 mL Oral Once  . sodium chloride flush  10-40 mL Intracatheter Q12H  . thiamine  100 mg Oral Daily   Continuous Infusions: . dextrose 75 mL/hr at 10/17/18 0300     LOS: 6 days   Marinda Elk  [facility]  10/17/2018, 10:43 AM

## 2018-10-17 NOTE — Progress Notes (Signed)
Reconsultation   Subjective  Chief Complaint: Anemia, cirrhosis and gallstones  We were previously following this patient in regards to his anemia with plans for EGD and colonoscopy but the patient then had a stroke 10/13/2017.  Was transferred to the neurology floor and was unable to prep.  Now patient is much improved, able to converse and able to eat.  He will certainly be able to prep for procedures.  He denies any new complaints or concerns today.   Objective   Vital signs in last 24 hours: Temp:  [98 F (36.7 C)-99.7 F (37.6 C)] 99.7 F (37.6 C) (02/14 0524) Pulse Rate:  [64-70] 67 (02/14 0524) Resp:  [18-24] 22 (02/14 0524) BP: (143-184)/(70-106) 143/76 (02/14 0524) SpO2:  [98 %-100 %] 100 % (02/14 0524) Weight:  [66.7 kg] 66.7 kg (02/14 0500) Last BM Date: 10/16/18 General:    AA male in NAD Heart:  Regular rate and rhythm; no murmurs Lungs: Respirations even and unlabored, lungs CTA bilaterally Abdomen:  Soft, nontender and nondistended. Normal bowel sounds. Extremities:  Without edema. Neurologic:  Alert and oriented,  grossly normal neurologically. Psych:  Cooperative. Normal mood and affect.  Intake/Output from previous day: 02/13 0701 - 02/14 0700 In: 1280.4 [P.O.:120; I.V.:1160.4] Out: -   Lab Results: Recent Labs    10/15/18 0538 10/16/18 0548  WBC 6.3 8.7  HGB 8.3* 9.0*  HCT 25.6* 26.4*  PLT 133* 148*   BMET Recent Labs    10/15/18 0538 10/16/18 0548 10/17/18 0613  NA 145 146* 143  K 3.4* 3.5 3.4*  CL 118* 118* 118*  CO2 22 18* 22  GLUCOSE 119* 133* 99  BUN 6 6 8   CREATININE 0.93 0.87 0.85  CALCIUM 7.4* 7.7* 7.7*    Studies/Results: Dg Swallowing Func-speech Pathology  Result Date: 10/16/2018 Objective Swallowing Evaluation: Type of Study: MBS-Modified Barium Swallow Study  Patient Details Name: Jacob Bass MRN: [id number] Date of Birth: 08-Aug-1961 Today's Date: 10/16/2018 Time: SLP Start Time (ACUTE ONLY): 1000 -SLP Stop Time (ACUTE  ONLY): 1030 SLP Time Calculation (min) (ACUTE ONLY): 30 min Past Medical History: Past Medical History: Diagnosis Date . CHF (congestive heart failure) (HCC)  . Hypertension  Past Surgical History: No past surgical history on file. HPI: 58 y.o. male with past medical history significant for hypertension, tobacco abuse, and homelessness; who presented 2/8 after slipping and falling in the rain.  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   On 2/9. pt presented with neuro changes with left sided weakness and dysarthria due to actue right ext capsule/basal ganglia/corona radiata CVA. EGD/colonscopy pending, but on hold due to changes in MS. Cortrak placed in IR 2/11. MRI 2/9: 13 mm acute/early subacute infarction within right external capsule and corona radiata. Several additional punctate acute/early subacute infarcts are present in bilateral basal ganglia. 3. 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 Advanced chronic microvascular ischemic changes and moderate volume loss of the brain.  Subjective: alert Assessment / Plan / Recommendation CHL IP CLINICAL IMPRESSIONS 10/16/2018 Clinical Impression Pt presents with a primary oral dysphagia with marked sensory loss on left, leading to residue in left lateral sulcus, spillage from left side of mouth, and no awareness of deficits.  There is impaired oral containment and bolus propulsion.  However, once bolus material reaches the pharynx, a swallow is triggered and there is reliable laryngeal vestibule closure.  There is mild vallecular residue of purees.  No aspiration nor penetration observed.  Testing was not ideal -  pt had difficulty attending to instructions and required constant cues to remain adequately positioned.  For today, recommend initiating a dysphagia 1 diet with thin liquids; meds should be crushed and given with applesauce.  D/C cortrak.  D/W Dr. Radonna Ricker.  SLP will follow for education, safety.  SLP Visit Diagnosis  Dysphagia, oral phase (R13.11) Attention and concentration deficit following -- Frontal lobe and executive function deficit following -- Impact on safety and function --   CHL IP TREATMENT RECOMMENDATION 10/16/2018 Treatment Recommendations Therapy as outlined in treatment plan below   No flowsheet data found. CHL IP DIET RECOMMENDATION 10/16/2018 SLP Diet Recommendations Dysphagia 1 (Puree) solids;Thin liquid Liquid Administration via Cup;Straw Medication Administration Crushed with puree Compensations Minimize environmental distractions;Lingual sweep for clearance of pocketing Postural Changes --   CHL IP OTHER RECOMMENDATIONS 10/16/2018 Recommended Consults -- Oral Care Recommendations Oral care BID Other Recommendations --   CHL IP FOLLOW UP RECOMMENDATIONS 10/16/2018 Follow up Recommendations Skilled Nursing facility   [facility] IP FREQUENCY AND DURATION 10/16/2018 Speech Therapy Frequency (ACUTE ONLY) min 3x week Treatment Duration 1 week      CHL IP ORAL PHASE 10/16/2018 Oral Phase Impaired Oral - Pudding Teaspoon -- Oral - Pudding Cup -- Oral - Honey Teaspoon -- Oral - Honey Cup -- Oral - Nectar Teaspoon -- Oral - Nectar Cup -- Oral - Nectar Straw -- Oral - Thin Teaspoon Weak lingual manipulation;Reduced posterior propulsion;Left pocketing in lateral sulci;Lingual/palatal residue;Delayed oral transit;Decreased bolus cohesion;Left anterior bolus loss Oral - Thin Cup Left anterior bolus loss;Weak lingual manipulation;Left pocketing in lateral sulci;Delayed oral transit;Decreased bolus cohesion Oral - Thin Straw Left anterior bolus loss;Weak lingual manipulation;Reduced posterior propulsion;Left pocketing in lateral sulci;Delayed oral transit;Decreased bolus cohesion Oral - Puree Weak lingual manipulation;Holding of bolus;Left pocketing in lateral sulci;Delayed oral transit;Decreased bolus cohesion Oral - Mech Soft -- Oral - Regular -- Oral - Multi-Consistency -- Oral - Pill -- Oral Phase - Comment --  CHL IP PHARYNGEAL  PHASE 10/16/2018 Pharyngeal Phase Impaired Pharyngeal- Pudding Teaspoon -- Pharyngeal -- Pharyngeal- Pudding Cup -- Pharyngeal -- Pharyngeal- Honey Teaspoon -- Pharyngeal -- Pharyngeal- Honey Cup -- Pharyngeal -- Pharyngeal- Nectar Teaspoon -- Pharyngeal -- Pharyngeal- Nectar Cup -- Pharyngeal -- Pharyngeal- Nectar Straw -- Pharyngeal -- Pharyngeal- Thin Teaspoon -- Pharyngeal -- Pharyngeal- Thin Cup -- Pharyngeal -- Pharyngeal- Thin Straw -- Pharyngeal -- Pharyngeal- Puree Pharyngeal residue - valleculae Pharyngeal -- Pharyngeal- Mechanical Soft -- Pharyngeal -- Pharyngeal- Regular -- Pharyngeal -- Pharyngeal- Multi-consistency -- Pharyngeal -- Pharyngeal- Pill -- Pharyngeal -- Pharyngeal Comment --  No flowsheet data found. [name] 10/16/2018, 10:44 AM               Assessment / Plan:   Assessment: 1.  Anemia, heme positive stool: No active bleeding, hemoglobin stable but patient will need to be placed on anticoagulation at discharge, due to this will need to complete anemia work-up with EGD and colonoscopy 2.  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  Homeless: 4.  CVA: During this hospitalization  Plan: 1.  We will schedule patient for EGD and colonoscopy tomorrow 10/18/2018 with Dr. Adela Lank.  Did discuss risks, benefits, limitations and alternatives and the patient agrees to proceed. 2.  Patient will be on clear liquid diet today and n.p.o. at midnight 3.  Patient will start movi prep this evening 4.  Please await any further recommendations from Dr. Adela Lank later today  Thank you for your kind consultation.  We will follow.   LOS: 6  days   Unk LightningJennifer Lynne Shaakira Borrero  10/17/2018, 11:30 AM

## 2018-10-17 NOTE — Progress Notes (Deleted)
Cardiology Office Note   Date:  10/17/2018   ID:  Jacob Bass, DOB [birth year], MRN [id number]  PCP:  Patient, No Pcp Per  Cardiologist: Dr. Jacques Navy   No chief complaint on file.    History of Present Illness: Jacob Bass is a 58 y.o. male who presents for ongoing assessment and management of hypertension. He was seen by Dr. Jacques Navy on consultation for elevated troponin after experiencing a mechanical fall. He was complaining of abdominal pain. He was diagnosed with demand ischemia, in the setting of anemia. He was to be considered for OP stress and or echo. He is homeless.   He had sudden onset of left sides weakness with acute infarct on the right external capsule and corona radiata, with several additional punctuated acute early subacute infarcts present bilaterally in basal ganglia and right anterior basal ganglia showed chronic hemorrhagic infarction and advanced microvascular ischemic changes.    Past Medical History:  Diagnosis Date  . CHF (congestive heart failure) (HCC)   . Hypertension     No past surgical history on file.   No current facility-administered medications for this visit.    No current outpatient medications on file.   Facility-Administered Medications Ordered in Other Visits  Medication Dose Route Frequency Provider Last Rate Last Dose  . acetaminophen (TYLENOL) tablet 650 mg  650 mg Oral Q4H PRN Rometta Emery, MD   650 mg at 10/10/18 0048  . amLODipine (NORVASC) tablet 2.5 mg  2.5 mg Oral Daily Marinda Elk, MD   2.5 mg at 10/17/18 2355  . aspirin tablet 325 mg  325 mg Per Tube Daily Marinda Elk, MD   325 mg at 10/17/18 1216  . atorvastatin (LIPITOR) tablet 40 mg  40 mg Per Tube q1800 Marinda Elk, MD   40 mg at 10/17/18 1640  . dextrose 5 % solution   Intravenous Continuous Marinda Elk, MD 75 mL/hr at 10/17/18 1243    . feeding supplement (PRO-STAT SUGAR FREE 64) liquid 30 mL  30 mL Oral BID Marinda Elk, MD       . folic acid (FOLVITE) tablet 1 mg  1 mg Oral Daily Katrinka Blazing, Rondell A, MD   1 mg at 10/17/18 0952  . heparin injection 5,000 Units  5,000 Units Subcutaneous Q8H Hyacinth Meeker [place], [place]   5,000 Units at 10/17/18 1602  . lisinopril (PRINIVIL,ZESTRIL) tablet 40 mg  40 mg Oral Daily Marinda Elk, MD   40 mg at 10/17/18 7322  . metoprolol tartrate (LOPRESSOR) tablet 25 mg  25 mg Per Tube BID Berton Mount I, MD   25 mg at 10/17/18 0952  . multivitamin with minerals tablet 1 tablet  1 tablet Oral Daily Madelyn Flavors A, MD   1 tablet at 10/17/18 [phone]  . ondansetron (ZOFRAN) injection 4 mg  4 mg Intravenous Q6H PRN Earlie Lou L, MD      . pantoprazole (PROTONIX) EC tablet 40 mg  40 mg Oral Daily Madelyn Flavors A, MD   40 mg at 10/17/18 0952  . polyethylene glycol (GoLYTELY/NuLYTELY) solution 2,000 mL  2,000 mL Oral Once David Stall, Darin Engels, MD       And  . polyethylene glycol (GoLYTELY/NuLYTELY) solution 2,000 mL  2,000 mL Oral Once David Stall, Darin Engels, MD      . potassium chloride 20 MEQ/15ML (10%) solution 40 mEq  40 mEq Oral BID Marinda Elk, MD   40 mEq at 10/17/18 1215  . sodium chloride flush (NS)  0.9 % injection 10-40 mL  10-40 mL Intracatheter Q12H Marinda Elk, MD   10 mL at 10/16/18 1123  . sodium chloride flush (NS) 0.9 % injection 10-40 mL  10-40 mL Intracatheter PRN Marinda Elk, MD      . thiamine (VITAMIN B-1) tablet 100 mg  100 mg Oral Daily Madelyn Flavors A, MD   100 mg at 10/17/18 4650    Allergies:   Penicillins    Social History:  The patient  reports that he has been smoking cigarettes. He has been smoking about 2.00 packs per day. He has never used smokeless tobacco. He reports current alcohol use. He reports that he does not use drugs.   Family History:  The patient's family history is not on file.    ROS: All other systems are reviewed and negative. Unless otherwise mentioned in H&P    PHYSICAL EXAM: VS:  There were no  vitals taken for this visit. , BMI There is no height or weight on file to calculate BMI. GEN: Well nourished, well developed, in no acute distress HEENT: normal Neck: no JVD, carotid bruits, or masses Cardiac: ***RRR; no murmurs, rubs, or gallops,no edema  Respiratory:  Clear to auscultation bilaterally, normal work of breathing GI: soft, nontender, nondistended, + BS MS: no deformity or atrophy Skin: warm and dry, no rash Neuro:  Strength and sensation are intact Psych: euthymic mood, full affect   EKG:  EKG {ACTION; IS/IS PTW:[id number]} ordered today. The ekg ordered today demonstrates ***   Recent Labs: 10/13/2018: ALT 63 10/14/2018: Magnesium 1.8 10/16/2018: Hemoglobin 9.0; Platelets 148 10/17/2018: BUN 8; Creatinine, Ser 0.85; Potassium 3.4; Sodium 143    Lipid Panel    Component Value Date/Time   CHOL 107 10/10/2018 0702   TRIG 69 10/10/2018 0702   HDL 21 (L) 10/10/2018 0702   CHOLHDL 5.1 10/10/2018 0702   VLDL 14 10/10/2018 0702   LDLCALC 72 10/10/2018 0702      Wt Readings from Last 3 Encounters:  10/17/18 147 lb (66.7 kg)  10/18/17 156 lb (70.8 kg)      Other studies Reviewed: Additional studies/ records that were reviewed today include: ***. Review of the above records demonstrates: ***   ASSESSMENT AND PLAN:  1.  ***   Current medicines are reviewed at length with the patient today.    Labs/ tests ordered today include: *** Bettey Mare. Liborio Nixon, ANP, AACC   10/17/2018 5:59 PM    [facility] [address] Office [phone] Fax [phone]

## 2018-10-18 ENCOUNTER — Inpatient Hospital Stay (HOSPITAL_COMMUNITY): Payer: Medicaid Other | Admitting: Anesthesiology

## 2018-10-18 DIAGNOSIS — I6330 Cerebral infarction due to thrombosis of unspecified cerebral artery: Secondary | ICD-10-CM

## 2018-10-18 LAB — CULTURE, BLOOD (ROUTINE X 2)
Culture: NO GROWTH
Culture: NO GROWTH
Special Requests: ADEQUATE
Special Requests: ADEQUATE

## 2018-10-18 LAB — GLUCOSE, CAPILLARY
GLUCOSE-CAPILLARY: 109 mg/dL — AB (ref 70–99)
Glucose-Capillary: 110 mg/dL — ABNORMAL HIGH (ref 70–99)
Glucose-Capillary: 72 mg/dL (ref 70–99)
Glucose-Capillary: 72 mg/dL (ref 70–99)
Glucose-Capillary: 74 mg/dL (ref 70–99)
Glucose-Capillary: 92 mg/dL (ref 70–99)

## 2018-10-18 LAB — BASIC METABOLIC PANEL
Anion gap: 5 (ref 5–15)
BUN: 10 mg/dL (ref 6–20)
CHLORIDE: 111 mmol/L (ref 98–111)
CO2: 22 mmol/L (ref 22–32)
Calcium: 7.6 mg/dL — ABNORMAL LOW (ref 8.9–10.3)
Creatinine, Ser: 0.74 mg/dL (ref 0.61–1.24)
GFR calc Af Amer: >60 mL/min (ref >60)
GFR calc non Af Amer: >60 mL/min (ref >60)
Glucose, Bld: 79 mg/dL (ref 70–99)
POTASSIUM: 4 mmol/L (ref 3.5–5.1)
Sodium: 138 mmol/L (ref 135–145)

## 2018-10-18 MED ORDER — POLYETHYLENE GLYCOL 3350 17 GM/SCOOP PO POWD
1.0000 | Freq: Once | ORAL | Status: DC
  Filled 2018-10-18: qty 255

## 2018-10-18 MED ORDER — PEG 3350-KCL-NA BICARB-NACL 420 G PO SOLR
4000.0000 mL | Freq: Once | ORAL | Status: AC
  Administered 2018-10-18: 4000 mL via ORAL
  Filled 2018-10-18: qty 4000

## 2018-10-18 NOTE — H&P (View-Only) (Signed)
Progress Note   Subjective  Patient declined bowel prep overnight (golytely). This was switched to Miralax / Gatorade prep which the patient accepted however no Gatorade in the hospital lastnight so he refused to do it. Otherwise stable without interval changes.    Objective   Vital signs in last 24 hours: Temp:  [97.8 F (36.6 C)-99.4 F (37.4 C)] 98.6 F (37 C) (02/15 0748) Pulse Rate:  [60-78] 78 (02/15 0748) Resp:  [18-20] 20 (02/15 0748) BP: (139-158)/(63-85) 139/77 (02/15 0748) SpO2:  [97 %-100 %] 97 % (02/15 0748) Weight:  [68.5 kg] 68.5 kg (02/15 0500) Last BM Date: 10/16/18 General:    AA male in NAD Heart:  Regular rate and rhythm; no murmurs Lungs: Respirations even and unlabored, lungs CTA bilaterally Abdomen:  Soft, nontender and nondistended.  Extremities:  Without edema. Neurologic:  Alert and oriented,   Psych:  Cooperative. Normal mood and affect.  Intake/Output from previous day: 02/14 0701 - 02/15 0700 In: 1340.9 [I.V.:1340.9] Out: -  Intake/Output this shift: No intake/output data recorded.  Lab Results: Recent Labs    10/16/18 0548  WBC 8.7  HGB 9.0*  HCT 26.4*  PLT 148*   BMET Recent Labs    10/16/18 0548 10/17/18 0613 10/18/18 0645  NA 146* 143 138  K 3.5 3.4* 4.0  CL 118* 118* 111  CO2 18* 22 22  GLUCOSE 133* 99 79  BUN 6 8 10   CREATININE 0.87 0.85 0.74  CALCIUM 7.7* 7.7* 7.6*   LFT No results for input(s): PROT, ALBUMIN, AST, ALT, ALKPHOS, BILITOT, BILIDIR, IBILI in the last 72 hours. PT/INR No results for input(s): LABPROT, INR in the last 72 hours.  Studies/Results: Dg Swallowing Func-speech Pathology  Result Date: 10/16/2018 Objective Swallowing Evaluation: Type of Study: MBS-Modified Barium Swallow Study  Patient Details Name: Jacob Bass MRN: [id number] Date of Birth: August 18, 1961 Today's Date: 10/16/2018 Time: SLP Start Time (ACUTE ONLY): 1000 -SLP Stop Time (ACUTE ONLY): 1030 SLP Time Calculation (min) (ACUTE ONLY):  30 min Past Medical History: Past Medical History: Diagnosis Date . CHF (congestive heart failure) (HCC)  . Hypertension  Past Surgical History: No past surgical history on file. HPI: 58 y.o. male with past medical history significant for hypertension, tobacco abuse, and homelessness; who presented 2/8 after slipping and falling in the rain.  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   On 2/9. pt presented with neuro changes with left sided weakness and dysarthria due to actue right ext capsule/basal ganglia/corona radiata CVA. EGD/colonscopy pending, but on hold due to changes in MS. Cortrak placed in IR 2/11. MRI 2/9: 13 mm acute/early subacute infarction within right external capsule and corona radiata. Several additional punctate acute/early subacute infarcts are present in bilateral basal ganglia. 3. 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 Advanced chronic microvascular ischemic changes and moderate volume loss of the brain.  Subjective: alert Assessment / Plan / Recommendation CHL IP CLINICAL IMPRESSIONS 10/16/2018 Clinical Impression Pt presents with a primary oral dysphagia with marked sensory loss on left, leading to residue in left lateral sulcus, spillage from left side of mouth, and no awareness of deficits.  There is impaired oral containment and bolus propulsion.  However, once bolus material reaches the pharynx, a swallow is triggered and there is reliable laryngeal vestibule closure.  There is mild vallecular residue of purees.  No aspiration nor penetration observed.  Testing was not ideal - pt had difficulty attending to instructions and required constant  cues to remain adequately positioned.  For today, recommend initiating a dysphagia 1 diet with thin liquids; meds should be crushed and given with applesauce.  D/C cortrak.  D/W Dr. Radonna RickerFeliz.  SLP will follow for education, safety.  SLP Visit Diagnosis Dysphagia, oral phase (R13.11) Attention and  concentration deficit following -- Frontal lobe and executive function deficit following -- Impact on safety and function --   CHL IP TREATMENT RECOMMENDATION 10/16/2018 Treatment Recommendations Therapy as outlined in treatment plan below   No flowsheet data found. CHL IP DIET RECOMMENDATION 10/16/2018 SLP Diet Recommendations Dysphagia 1 (Puree) solids;Thin liquid Liquid Administration via Cup;Straw Medication Administration Crushed with puree Compensations Minimize environmental distractions;Lingual sweep for clearance of pocketing Postural Changes --   CHL IP OTHER RECOMMENDATIONS 10/16/2018 Recommended Consults -- Oral Care Recommendations Oral care BID Other Recommendations --   CHL IP FOLLOW UP RECOMMENDATIONS 10/16/2018 Follow up Recommendations [facility] D[facility] 10/16/2018 Speech Therapy Frequency (ACUTE ONLY) min 3x week Treatment Duration 1 week      CHL IP ORAL PHASE 10/16/2018 Oral Phase Impaired Oral - Pudding Teaspoon -- Oral - Pudding Cup -- Oral - Honey Teaspoon -- Oral - Honey Cup -- Oral - Nectar Teaspoon -- Oral - Nectar Cup -- Oral - Nectar Straw -- Oral - Thin Teaspoon Weak lingual manipulation;Reduced posterior propulsion;Left pocketing in lateral sulci;Lingual/palatal residue;Delayed oral transit;Decreased bolus cohesion;Left anterior bolus loss Oral - Thin Cup Left anterior bolus loss;Weak lingual manipulation;Left pocketing in lateral sulci;Delayed oral transit;Decreased bolus cohesion Oral - Thin Straw Left anterior bolus loss;Weak lingual manipulation;Reduced posterior propulsion;Left pocketing in lateral sulci;Delayed oral transit;Decreased bolus cohesion Oral - Puree Weak lingual manipulation;Holding of bolus;Left pocketing in lateral sulci;Delayed oral transit;Decreased bolus cohesion Oral - Mech Soft -- Oral - Regular -- Oral - Multi-Consistency -- Oral - Pill -- Oral Phase - Comment --  CHL IP PHARYNGEAL PHASE 10/16/2018 Pharyngeal Phase Impaired  Pharyngeal- Pudding Teaspoon -- Pharyngeal -- Pharyngeal- Pudding Cup -- Pharyngeal -- Pharyngeal- Honey Teaspoon -- Pharyngeal -- Pharyngeal- Honey Cup -- Pharyngeal -- Pharyngeal- Nectar Teaspoon -- Pharyngeal -- Pharyngeal- Nectar Cup -- Pharyngeal -- Pharyngeal- Nectar Straw -- Pharyngeal -- Pharyngeal- Thin Teaspoon -- Pharyngeal -- Pharyngeal- Thin Cup -- Pharyngeal -- Pharyngeal- Thin Straw -- Pharyngeal -- Pharyngeal- Puree Pharyngeal residue - valleculae Pharyngeal -- Pharyngeal- Mechanical Soft -- Pharyngeal -- Pharyngeal- Regular -- Pharyngeal -- Pharyngeal- Multi-consistency -- Pharyngeal -- Pharyngeal- Pill -- Pharyngeal -- Pharyngeal Comment --  No flowsheet data found. [name], Amanda [name] 10/16/2018, 10:44 AM                  Assessment / Plan:   58 y/o male with cirrhosis secondary to HCV and EtOH, who was admitted after a fall, incidentally noted to have a macrocytic anemia with heme positive stools but no overt GI bleeding. He was supposed to have an EGD and colonoscopy previously during this admission for a workup while inpatient as he is homeless as this would be quite difficult to do as outpatient, but he had a stroke. He has since recovered. Primary service is hoping to use Plavix moving forward, requesting EGD and colonoscopy prior to that which is reasonable. He is currently on a regular aspirin, no bleeding symptoms.  He declined Golytely last night, unfortunately no Gatorade in the hospital for miralax prep. I discussed options with him, it is in his best interested to follow through with having these procedures if possible prior to initiation of Plavix given  his recent CVA. I think this will be extremely difficult for him to do as an outpatient. Following discussion he was amenable. Keep on clear liquids today, plan for bowel prep this evening. Please see if there is Gatorade in the hospital for Gatorade Miralax prep. If not, he will have to drink the Golytely. I have him  tentatively scheduled for tomorrow AM for his procedures.   Ileene Patrick, MD [facility]

## 2018-10-18 NOTE — Anesthesia Preprocedure Evaluation (Deleted)
Anesthesia Evaluation  Patient identified by MRN, date of birth, ID band Patient awake    Reviewed: Allergy & Precautions, H&P , NPO status , Patient's Chart, lab work & pertinent test results  Airway Mallampati: II  TM Distance: >3 FB     Dental no notable dental hx. (+) Edentulous Upper, Poor Dentition   Pulmonary Current Smoker,    Pulmonary exam normal breath sounds clear to auscultation       Cardiovascular hypertension,  Rhythm:Regular Rate:Normal     Neuro/Psych negative neurological ROS  negative psych ROS   GI/Hepatic negative GI ROS, Neg liver ROS,   Endo/Other  negative endocrine ROS  Renal/GU negative Renal ROS  negative genitourinary   Musculoskeletal   Abdominal   Peds  Hematology  (+) Blood dyscrasia, anemia ,   Anesthesia Other Findings   Reproductive/Obstetrics negative OB ROS                            Anesthesia Physical Anesthesia Plan  ASA: III  Anesthesia Plan: MAC   Post-op Pain Management:    Induction: Intravenous  PONV Risk Score and Plan: 0 and Propofol infusion and Ondansetron  Airway Management Planned: Nasal Cannula  Additional Equipment:   Intra-op Plan:   Post-operative Plan:   Informed Consent: I have reviewed the patients History and Physical, chart, labs and discussed the procedure including the risks, benefits and alternatives for the proposed anesthesia with the patient or authorized representative who has indicated his/her understanding and acceptance.     Dental advisory given  Plan Discussed with: CRNA and Anesthesiologist  Anesthesia Plan Comments:        Anesthesia Quick Evaluation

## 2018-10-18 NOTE — Progress Notes (Signed)
[facility] PROGRESS NOTE    Progress Note  Jacob Bass  ZOX:[id number] DOB: 09/30/1960 DOA: 10/09/2018 PCP: Patient, No Pcp Per     Brief Narrative:   Jacob Bass is an 58 y.o. male past medical history significant for hypertension, heart failure tobacco abuse and homeless presents to the hospital after slipping and falling in the rain.  To the hospital his troponins were minimally elevated, cardiology is cleared the patient due to his anemia GI was consulted who proceeded with an EGD and colonoscopy.  The patient mental status continued to deteriorate and was noted to have a left upper extremity weakness with a facial droop and MRI of the brain was done that revealed 13 mm acute infarct on the right external capsule and corona radiata, with several additional punctuated acute early subacute infarcts present bilaterally in basal ganglia and right anterior basal ganglia showed chronic hemorrhagic infarction and advanced microvascular ischemic changes.  GI team is advised is okay to resume antiplatelet therapy.  Assessment/Plan:   Acute encephalopathy likely due to acute external capsule and basal ganglia infarct: MRI of the brain showed results as below. Neurology was consulted.   2D echo was done that showed EF of 60 to 65%. Currently on a statin. He will go home on aspirin and Plavix for 3 months after this he will continue Plavix indefinitely.  Metabolic encephalopathy: Likely multifactorial in the setting of volume depletion stroke alcohol liver cirrhosis and recurrent hypoglycemia.  Continue management of care and underlying processes. Patient is back to baseline.  Episodes hypoglycemia: Likely due to decreased oral intake. Currently n.p.o.  Macrocytic anemia: Multifactorial likely due to alcohol and possible B12 deficiency. For colonoscopy on 10/19/2018.  Elevated troponins: Setting of demand ischemia cardiology was consulted recommended no further work-up.  Hepatitis  C antibody positive in blood/  Cirrhosis of liver without ascites (HCC) Mildly elevated liver enzymes, hepatitis C viral load noted. Will need follow-up with GI as an outpatient.  Alcohol abuse: Continue Ativan protocol.  Status post fall: This is probably due to CVA. Physical therapy and occupational therapy evaluated the patient and recommended 24-hour supervision versus skilled nursing facility.  Essential hypertension: Pressure has remained stable continue lisinopril and metoprolol will need further titration as an outpatient as tolerated.  Chronic thrombocytopenia: Likely due to alcohol abuse.  Tobacco abuse: Counseling.  Choledocholithiasis: Follow-up as an outpatient.    New mild hypernatremia: Hypovolemic resolved with IV fluid hydration.   DVT prophylaxis:Heparin Family Communication:none Disposition Plan/Barrier to D/C: unable to determine Code Status:     Code Status Orders  (From admission, onward)         Start     Ordered   10/09/18 2345  Full code  Continuous     10/09/18 2344        Code Status History    This patient has a current code status but no historical code status.        IV Access:    Peripheral IV   Procedures and diagnostic studies:   Dg Swallowing Func-speech Pathology  Result Date: 10/16/2018 Objective Swallowing Evaluation: Type of Study: MBS-Modified Barium Swallow Study  Patient Details Name: Jacob Bass MRN: [id number] Date of Birth: 10/29/1960 Today's Date: 10/16/2018 Time: SLP Start Time (ACUTE ONLY): 1000 -SLP Stop Time (ACUTE ONLY): 1030 SLP Time Calculation (min) (ACUTE ONLY): 30 min Past Medical History: Past Medical History: Diagnosis Date . CHF (congestive heart failure) (HCC)  . Hypertension  Past Surgical History: No past surgical history on  file. HPI: 58 y.o. male with past medical history significant for hypertension, tobacco abuse, and homelessness; who presented 2/8 after slipping and falling in the rain.  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   On 2/9. pt presented with neuro changes with left sided weakness and dysarthria due to actue right ext capsule/basal ganglia/corona radiata CVA. EGD/colonscopy pending, but on hold due to changes in MS. Cortrak placed in IR 2/11. MRI 2/9: 13 mm acute/early subacute infarction within right external capsule and corona radiata. Several additional punctate acute/early subacute infarcts are present in bilateral basal ganglia. 3. 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 Advanced chronic microvascular ischemic changes and moderate volume loss of the brain.  Subjective: alert Assessment / Plan / Recommendation CHL IP CLINICAL IMPRESSIONS 10/16/2018 Clinical Impression Pt presents with a primary oral dysphagia with marked sensory loss on left, leading to residue in left lateral sulcus, spillage from left side of mouth, and no awareness of deficits.  There is impaired oral containment and bolus propulsion.  However, once bolus material reaches the pharynx, a swallow is triggered and there is reliable laryngeal vestibule closure.  There is mild vallecular residue of purees.  No aspiration nor penetration observed.  Testing was not ideal - pt had difficulty attending to instructions and required constant cues to remain adequately positioned.  For today, recommend initiating a dysphagia 1 diet with thin liquids; meds should be crushed and given with applesauce.  D/C cortrak.  D/W Dr. Radonna Ricker.  SLP will follow for education, safety.  SLP Visit Diagnosis Dysphagia, oral phase (R13.11) Attention and concentration deficit following -- Frontal lobe and executive function deficit following -- Impact on safety and function --   CHL IP TREATMENT RECOMMENDATION 10/16/2018 Treatment Recommendations Therapy as outlined in treatment plan below   No flowsheet data found. CHL IP DIET RECOMMENDATION 10/16/2018 SLP Diet Recommendations Dysphagia 1 (Puree)  solids;Thin liquid Liquid Administration via Cup;Straw Medication Administration Crushed with puree Compensations Minimize environmental distractions;Lingual sweep for clearance of pocketing Postural Changes --   CHL IP OTHER RECOMMENDATIONS 10/16/2018 Recommended Consults -- Oral Care Recommendations Oral care BID Other Recommendations --   CHL IP FOLLOW UP RECOMMENDATIONS 10/16/2018 Follow up Recommendations Skilled Nursing facility   [facility] IP FREQUENCY AND DURATION 10/16/2018 Speech Therapy Frequency (ACUTE ONLY) min 3x week Treatment Duration 1 week      CHL IP ORAL PHASE 10/16/2018 Oral Phase Impaired Oral - Pudding Teaspoon -- Oral - Pudding Cup -- Oral - Honey Teaspoon -- Oral - Honey Cup -- Oral - Nectar Teaspoon -- Oral - Nectar Cup -- Oral - Nectar Straw -- Oral - Thin Teaspoon Weak lingual manipulation;Reduced posterior propulsion;Left pocketing in lateral sulci;Lingual/palatal residue;Delayed oral transit;Decreased bolus cohesion;Left anterior bolus loss Oral - Thin Cup Left anterior bolus loss;Weak lingual manipulation;Left pocketing in lateral sulci;Delayed oral transit;Decreased bolus cohesion Oral - Thin Straw Left anterior bolus loss;Weak lingual manipulation;Reduced posterior propulsion;Left pocketing in lateral sulci;Delayed oral transit;Decreased bolus cohesion Oral - Puree Weak lingual manipulation;Holding of bolus;Left pocketing in lateral sulci;Delayed oral transit;Decreased bolus cohesion Oral - Mech Soft -- Oral - Regular -- Oral - Multi-Consistency -- Oral - Pill -- Oral Phase - Comment --  CHL IP PHARYNGEAL PHASE 10/16/2018 Pharyngeal Phase Impaired Pharyngeal- Pudding Teaspoon -- Pharyngeal -- Pharyngeal- Pudding Cup -- Pharyngeal -- Pharyngeal- Honey Teaspoon -- Pharyngeal -- Pharyngeal- Honey Cup -- Pharyngeal -- Pharyngeal- Nectar Teaspoon -- Pharyngeal -- Pharyngeal- Nectar Cup -- Pharyngeal -- Pharyngeal- Nectar Straw -- Pharyngeal -- Pharyngeal-  Thin Teaspoon -- Pharyngeal -- Pharyngeal-  Thin Cup -- Pharyngeal -- Pharyngeal- Thin Straw -- Pharyngeal -- Pharyngeal- Puree Pharyngeal residue - valleculae Pharyngeal -- Pharyngeal- Mechanical Soft -- Pharyngeal -- Pharyngeal- Regular -- Pharyngeal -- Pharyngeal- Multi-consistency -- Pharyngeal -- Pharyngeal- Pill -- Pharyngeal -- Pharyngeal Comment --  No flowsheet data found. [name], Amanda [name] 10/16/2018, 10:44 AM                Medical Consultants:    None.  Anti-Infectives:   None  Subjective:    Jacob Bass has no new complaints.  Objective:    Vitals:   10/17/18 2128 10/18/18 0103 10/18/18 0500 10/18/18 0748  BP: (!) 150/63 (!) 153/67  139/77  Pulse: 61 65  78  Resp: 20 20  20   Temp: 99.4 F (37.4 C) 98 F (36.7 C)  98.6 F (37 C)  TempSrc: Oral Oral  Oral  SpO2: 100% 100%  97%  Weight:   68.5 kg   Height:        Intake/Output Summary (Last 24 hours) at 10/18/2018 0847 Last data filed at 10/17/2018 2300 Gross per 24 hour  Intake 1340.86 ml  Output -  Net 1340.86 ml   Filed Weights   10/13/18 0700 10/17/18 0500 10/18/18 0500  Weight: 62.1 kg 66.7 kg 68.5 kg    Exam: General exam: In no acute distress. Respiratory system: Good air movement and clear to auscultation. Cardiovascular system: Regular rate and rhythm with positive S1-S2 Gastrointestinal system: Abdomen is nondistended, soft and nontender.  Central nervous system: Alert and oriented. No focal neurological deficits. Extremities: No pedal edema. Skin: No rashes, lesions or ulcers    Data Reviewed:    Labs: Basic Metabolic Panel: Recent Labs  Lab 10/14/18 0459 10/15/18 0538 10/16/18 0548 10/17/18 0613 10/18/18 0645  NA 144 145 146* 143 138  K 3.6 3.4* 3.5 3.4* 4.0  CL 114* 118* 118* 118* 111  CO2 24 22 18* 22 22  GLUCOSE 106* 119* 133* 99 79  BUN 7 6 6 8 10   CREATININE 0.85 0.93 0.87 0.85 0.74  CALCIUM 7.9* 7.4* 7.7* 7.7* 7.6*  MG 1.8  --   --   --   --    GFR Estimated Creatinine Clearance: 86.5 mL/min (by  C-G formula based on SCr of 0.74 mg/dL). Liver Function Tests: Recent Labs  Lab 10/13/18 1209  AST 77*  ALT 63*  ALKPHOS 63  BILITOT 1.5*  PROT 5.8*  ALBUMIN 2.2*   No results for input(s): LIPASE, AMYLASE in the last 168 hours. Recent Labs  Lab 10/13/18 1209 10/14/18 1222  AMMONIA 46* 22   Coagulation profile Recent Labs  Lab 10/11/18 1242 10/13/18 1209  INR 1.23 1.33    CBC: Recent Labs  Lab 10/13/18 0517 10/14/18 0459 10/15/18 0538 10/16/18 0548  WBC 6.9 7.9 6.3 8.7  HGB 9.0* 9.9* 8.3* 9.0*  HCT 27.1* 29.2* 25.6* 26.4*  MCV 100.4* 102.1* 102.8* 102.3*  PLT 121* 136* 133* 148*   Cardiac Enzymes: No results for input(s): CKTOTAL, CKMB, CKMBINDEX, TROPONINI in the last 168 hours. BNP (last 3 results) No results for input(s): PROBNP in the last 8760 hours. CBG: Recent Labs  Lab 10/17/18 1648 10/17/18 1956 10/18/18 0058 10/18/18 0408 10/18/18 0826  GLUCAP 82 92 110* 72 72   D-Dimer: No results for input(s): DDIMER in the last 72 hours. Hgb A1c: No results for input(s): HGBA1C in the last 72 hours. Lipid Profile: No results for input(s): CHOL, HDL, LDLCALC, TRIG,  CHOLHDL, LDLDIRECT in the last 72 hours. Thyroid function studies: No results for input(s): TSH, T4TOTAL, T3FREE, THYROIDAB in the last 72 hours.  Invalid input(s): FREET3 Anemia work up: No results for input(s): VITAMINB12, FOLATE, FERRITIN, TIBC, IRON, RETICCTPCT in the last 72 hours. Sepsis Labs: Recent Labs  Lab 10/13/18 0517 10/13/18 2334 10/14/18 0459 10/15/18 0538 10/16/18 0548  PROCALCITON  --  <0.10  --   --   --   WBC 6.9  --  7.9 6.3 8.7   Microbiology Recent Results (from the past 240 hour(s))  Culture, blood (routine x 2)     Status: None   Collection Time: 10/13/18  7:59 PM  Result Value Ref Range Status   Specimen Description BLOOD LEFT HAND  Final   Special Requests   Final    BOTTLES DRAWN AEROBIC AND ANAEROBIC Blood Culture adequate volume   Culture   Final      NO GROWTH 5 DAYS Performed at [facility], [address] [place], [place] [address]    Report Status 10/18/2018 FINAL  Final  Culture, blood (routine x 2)     Status: None   Collection Time: 10/13/18  7:59 PM  Result Value Ref Range Status   Specimen Description BLOOD RIGHT HAND  Final   Special Requests   Final    BOTTLES DRAWN AEROBIC ONLY Blood Culture adequate volume   Culture   Final    NO GROWTH 5 DAYS Performed at [facility], [address], [place], [place] [address]    Report Status 10/18/2018 FINAL  Final  Culture, Urine     Status: Abnormal   Collection Time: 10/14/18 12:41 AM  Result Value Ref Range Status   Specimen Description URINE, CLEAN CATCH  Final   Special Requests   Final    NONE Performed at [facility], [address], [place], [place] [address]    Culture MULTIPLE SPECIES PRESENT, SUGGEST RECOLLECTION (A)  Final   Report Status 10/16/2018 FINAL  Final     Medications:   . amLODipine  2.5 mg Oral Daily  . aspirin  325 mg Per Tube Daily  . atorvastatin  40 mg Per Tube q1800  . feeding supplement (PRO-STAT SUGAR FREE 64)  30 mL Oral BID  . folic acid  1 mg Oral Daily  . heparin injection (subcutaneous)  5,000 Units Subcutaneous Q8H  . lisinopril  40 mg Oral Daily  . metoprolol tartrate  25 mg Per Tube BID  . multivitamin with minerals  1 tablet Oral Daily  . pantoprazole  40 mg Oral Daily  . polyethylene glycol powder  1 Container Oral Once  . sodium chloride flush  10-40 mL Intracatheter Q12H  . thiamine  100 mg Oral Daily   Continuous Infusions: . sodium chloride    . dextrose 75 mL/hr at 10/18/18 0211     LOS: 7 days   Jacob Bass  [facility]  10/18/2018, 8:47 AM

## 2018-10-18 NOTE — Progress Notes (Signed)
Went to the pt's room at 0655 and found the Mid-line out and on the bed. Pt also pulled out his condom cath. No bleeding was noted. Pt was not in any distress. Provider on-call was notified as well as IV team. Has already reported off to the on-coming shift nurse.

## 2018-10-18 NOTE — Progress Notes (Addendum)
Nurse tech called nurse into the room and nurse came to room to find patient with knees on floor mat. Patient stated he was trying to leave. Nurse Tech and nurse cleaned patient and safely got patient back to bed. Patient was not injured. Dr. David Stall notified. BP 162/77, pulse is 100. No neuro changes. Nurse will continue to monitor. Portable had been called for defective bed. Jacob Bass Jacob Bass

## 2018-10-18 NOTE — Progress Notes (Signed)
Pt had refused bowel prep, but the GI on-call provider changed the order for the prep per patient preference to use gatorade. Unfortunately, there is no gatorade. I call the kitchen, and also called the pharmacy, non of them could provide Gatorade for the mixture of miralax. Therefore, no bowel prep was done. Will report off in am.

## 2018-10-18 NOTE — Progress Notes (Signed)
Progress Note   Subjective  Patient declined bowel prep overnight (golytely). This was switched to Miralax / Gatorade prep which the patient accepted however no Gatorade in the hospital lastnight so he refused to do it. Otherwise stable without interval changes.    Objective   Vital signs in last 24 hours: Temp:  [97.8 F (36.6 C)-99.4 F (37.4 C)] 98.6 F (37 C) (02/15 0748) Pulse Rate:  [60-78] 78 (02/15 0748) Resp:  [18-20] 20 (02/15 0748) BP: (139-158)/(63-85) 139/77 (02/15 0748) SpO2:  [97 %-100 %] 97 % (02/15 0748) Weight:  [68.5 kg] 68.5 kg (02/15 0500) Last BM Date: 10/16/18 General:    AA male in NAD Heart:  Regular rate and rhythm; no murmurs Lungs: Respirations even and unlabored, lungs CTA bilaterally Abdomen:  Soft, nontender and nondistended.  Extremities:  Without edema. Neurologic:  Alert and oriented,   Psych:  Cooperative. Normal mood and affect.  Intake/Output from previous day: 02/14 0701 - 02/15 0700 In: 1340.9 [I.V.:1340.9] Out: -  Intake/Output this shift: No intake/output data recorded.  Lab Results: Recent Labs    10/16/18 0548  WBC 8.7  HGB 9.0*  HCT 26.4*  PLT 148*   BMET Recent Labs    10/16/18 0548 10/17/18 0613 10/18/18 0645  NA 146* 143 138  K 3.5 3.4* 4.0  CL 118* 118* 111  CO2 18* 22 22  GLUCOSE 133* 99 79  BUN 6 8 10   CREATININE 0.87 0.85 0.74  CALCIUM 7.7* 7.7* 7.6*   LFT No results for input(s): PROT, ALBUMIN, AST, ALT, ALKPHOS, BILITOT, BILIDIR, IBILI in the last 72 hours. PT/INR No results for input(s): LABPROT, INR in the last 72 hours.  Studies/Results: Dg Swallowing Func-speech Pathology  Result Date: 10/16/2018 Objective Swallowing Evaluation: Type of Study: MBS-Modified Barium Swallow Study  Patient Details Name: Jacob Bass MRN: [id number] Date of Birth: August 18, 1961 Today's Date: 10/16/2018 Time: SLP Start Time (ACUTE ONLY): 1000 -SLP Stop Time (ACUTE ONLY): 1030 SLP Time Calculation (min) (ACUTE ONLY):  30 min Past Medical History: Past Medical History: Diagnosis Date . CHF (congestive heart failure) (HCC)  . Hypertension  Past Surgical History: No past surgical history on file. HPI: 58 y.o. male with past medical history significant for hypertension, tobacco abuse, and homelessness; who presented 2/8 after slipping and falling in the rain.  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   On 2/9. pt presented with neuro changes with left sided weakness and dysarthria due to actue right ext capsule/basal ganglia/corona radiata CVA. EGD/colonscopy pending, but on hold due to changes in MS. Cortrak placed in IR 2/11. MRI 2/9: 13 mm acute/early subacute infarction within right external capsule and corona radiata. Several additional punctate acute/early subacute infarcts are present in bilateral basal ganglia. 3. 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 Advanced chronic microvascular ischemic changes and moderate volume loss of the brain.  Subjective: alert Assessment / Plan / Recommendation CHL IP CLINICAL IMPRESSIONS 10/16/2018 Clinical Impression Pt presents with a primary oral dysphagia with marked sensory loss on left, leading to residue in left lateral sulcus, spillage from left side of mouth, and no awareness of deficits.  There is impaired oral containment and bolus propulsion.  However, once bolus material reaches the pharynx, a swallow is triggered and there is reliable laryngeal vestibule closure.  There is mild vallecular residue of purees.  No aspiration nor penetration observed.  Testing was not ideal - pt had difficulty attending to instructions and required constant  cues to remain adequately positioned.  For today, recommend initiating a dysphagia 1 diet with thin liquids; meds should be crushed and given with applesauce.  D/C cortrak.  D/W Dr. Feliz.  SLP will follow for education, safety.  SLP Visit Diagnosis Dysphagia, oral phase (R13.11) Attention and  concentration deficit following -- Frontal lobe and executive function deficit following -- Impact on safety and function --   CHL IP TREATMENT RECOMMENDATION 10/16/2018 Treatment Recommendations Therapy as outlined in treatment plan below   No flowsheet data found. CHL IP DIET RECOMMENDATION 10/16/2018 SLP Diet Recommendations Dysphagia 1 (Puree) solids;Thin liquid Liquid Administration via Cup;Straw Medication Administration Crushed with puree Compensations Minimize environmental distractions;Lingual sweep for clearance of pocketing Postural Changes --   CHL IP OTHER RECOMMENDATIONS 10/16/2018 Recommended Consults -- Oral Care Recommendations Oral care BID Other Recommendations --   CHL IP FOLLOW UP RECOMMENDATIONS 10/16/2018 Follow up Recommendations Skilled Nursing facility   CH[facility] IP FREQUENCY AND DURATION 10/16/2018 Speech Therapy Frequency (ACUTE ONLY) min 3x week Treatment Duration 1 week      CHL IP ORAL PHASE 10/16/2018 Oral Phase Impaired Oral - Pudding Teaspoon -- Oral - Pudding Cup -- Oral - Honey Teaspoon -- Oral - Honey Cup -- Oral - Nectar Teaspoon -- Oral - Nectar Cup -- Oral - Nectar Straw -- Oral - Thin Teaspoon Weak lingual manipulation;Reduced posterior propulsion;Left pocketing in lateral sulci;Lingual/palatal residue;Delayed oral transit;Decreased bolus cohesion;Left anterior bolus loss Oral - Thin Cup Left anterior bolus loss;Weak lingual manipulation;Left pocketing in lateral sulci;Delayed oral transit;Decreased bolus cohesion Oral - Thin Straw Left anterior bolus loss;Weak lingual manipulation;Reduced posterior propulsion;Left pocketing in lateral sulci;Delayed oral transit;Decreased bolus cohesion Oral - Puree Weak lingual manipulation;Holding of bolus;Left pocketing in lateral sulci;Delayed oral transit;Decreased bolus cohesion Oral - Mech Soft -- Oral - Regular -- Oral - Multi-Consistency -- Oral - Pill -- Oral Phase - Comment --  CHL IP PHARYNGEAL PHASE 10/16/2018 Pharyngeal Phase Impaired  Pharyngeal- Pudding Teaspoon -- Pharyngeal -- Pharyngeal- Pudding Cup -- Pharyngeal -- Pharyngeal- Honey Teaspoon -- Pharyngeal -- Pharyngeal- Honey Cup -- Pharyngeal -- Pharyngeal- Nectar Teaspoon -- Pharyngeal -- Pharyngeal- Nectar Cup -- Pharyngeal -- Pharyngeal- Nectar Straw -- Pharyngeal -- Pharyngeal- Thin Teaspoon -- Pharyngeal -- Pharyngeal- Thin Cup -- Pharyngeal -- Pharyngeal- Thin Straw -- Pharyngeal -- Pharyngeal- Puree Pharyngeal residue - valleculae Pharyngeal -- Pharyngeal- Mechanical Soft -- Pharyngeal -- Pharyngeal- Regular -- Pharyngeal -- Pharyngeal- Multi-consistency -- Pharyngeal -- Pharyngeal- Pill -- Pharyngeal -- Pharyngeal Comment --  No flowsheet data found. 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 He was supposed to have an EGD and colonoscopy previously during this admission for a workup while inpatient as he is homeless as this would be quite difficult to do as outpatient, but he had a stroke. He has since recovered. Primary service is hoping to use Plavix moving forward, requesting EGD and colonoscopy prior to that which is reasonable. He is currently on a regular aspirin, no bleeding symptoms.  He declined Golytely last night, unfortunately no Gatorade in the hospital for miralax prep. I discussed options with him, it is in his best interested to follow through with having these procedures if possible prior to initiation of Plavix given   his recent CVA. I think this will be extremely difficult for him to do as an outpatient. Following discussion he was amenable. Keep on clear liquids today, plan for bowel prep this evening. Please see if there is Gatorade in the hospital for Gatorade Miralax prep. If not, he will have to drink the Golytely. I have him  tentatively scheduled for tomorrow AM for his procedures.   Ileene Patrick, MD [facility]

## 2018-10-19 ENCOUNTER — Encounter (HOSPITAL_COMMUNITY): Payer: Self-pay | Admitting: Gastroenterology

## 2018-10-19 ENCOUNTER — Encounter (HOSPITAL_COMMUNITY)
Admission: EM | Disposition: A | Discharge status: Skilled Nursing Facility | Payer: Self-pay | Source: Home / Self Care | Attending: Internal Medicine

## 2018-10-19 DIAGNOSIS — K259 Gastric ulcer, unspecified as acute or chronic, without hemorrhage or perforation: Secondary | ICD-10-CM

## 2018-10-19 HISTORY — PX: BIOPSY: SHX5522

## 2018-10-19 HISTORY — PX: ESOPHAGOGASTRODUODENOSCOPY (EGD) WITH PROPOFOL: SHX5813

## 2018-10-19 LAB — GLUCOSE, CAPILLARY
GLUCOSE-CAPILLARY: 69 mg/dL — AB (ref 70–99)
GLUCOSE-CAPILLARY: 86 mg/dL (ref 70–99)
Glucose-Capillary: 70 mg/dL (ref 70–99)
Glucose-Capillary: 70 mg/dL (ref 70–99)
Glucose-Capillary: 70 mg/dL (ref 70–99)
Glucose-Capillary: 74 mg/dL (ref 70–99)
Glucose-Capillary: 79 mg/dL (ref 70–99)
Glucose-Capillary: 82 mg/dL (ref 70–99)
Glucose-Capillary: 84 mg/dL (ref 70–99)

## 2018-10-19 LAB — BASIC METABOLIC PANEL
Anion gap: 7 (ref 5–15)
BUN: 10 mg/dL (ref 6–20)
CO2: 22 mmol/L (ref 22–32)
Calcium: 7.8 mg/dL — ABNORMAL LOW (ref 8.9–10.3)
Chloride: 108 mmol/L (ref 98–111)
Creatinine, Ser: 0.77 mg/dL (ref 0.61–1.24)
GFR calc Af Amer: >60 mL/min (ref >60)
GFR calc non Af Amer: >60 mL/min (ref >60)
GLUCOSE: 74 mg/dL (ref 70–99)
Potassium: 3.6 mmol/L (ref 3.5–5.1)
Sodium: 137 mmol/L (ref 135–145)

## 2018-10-19 SURGERY — CANCELLED PROCEDURE

## 2018-10-19 SURGERY — ESOPHAGOGASTRODUODENOSCOPY (EGD) WITH PROPOFOL
Anesthesia: Monitor Anesthesia Care

## 2018-10-19 MED ORDER — FENTANYL CITRATE (PF) 100 MCG/2ML IJ SOLN
INTRAMUSCULAR | Status: DC | PRN
  Administered 2018-10-19: 50 ug via INTRAVENOUS

## 2018-10-19 MED ORDER — MIDAZOLAM HCL 2 MG/2ML IJ SOLN
INTRAMUSCULAR | Status: DC | PRN
  Administered 2018-10-19: 1 mg via INTRAVENOUS
  Administered 2018-10-19: 2 mg via INTRAVENOUS

## 2018-10-19 MED ORDER — PANTOPRAZOLE SODIUM 40 MG PO TBEC
40.0000 mg | DELAYED_RELEASE_TABLET | Freq: Two times a day (BID) | ORAL | Status: DC
  Administered 2018-10-19 – 2018-10-27 (×16): 40 mg via ORAL
  Filled 2018-10-19 (×17): qty 1

## 2018-10-19 MED ORDER — LACTATED RINGERS IV SOLN
INTRAVENOUS | Status: DC
  Administered 2018-10-19: 09:00:00 via INTRAVENOUS

## 2018-10-19 MED ORDER — BUTAMBEN-TETRACAINE-BENZOCAINE 2-2-14 % EX AERO
INHALATION_SPRAY | CUTANEOUS | Status: DC | PRN
  Administered 2018-10-19: 2 via TOPICAL

## 2018-10-19 MED ORDER — FENTANYL CITRATE (PF) 100 MCG/2ML IJ SOLN
INTRAMUSCULAR | Status: AC
  Filled 2018-10-19: qty 2

## 2018-10-19 MED ORDER — MIDAZOLAM HCL (PF) 5 MG/ML IJ SOLN
INTRAMUSCULAR | Status: AC
  Filled 2018-10-19: qty 2

## 2018-10-19 SURGICAL SUPPLY — 15 items

## 2018-10-19 SURGICAL SUPPLY — 25 items

## 2018-10-19 NOTE — Progress Notes (Signed)
EGD cancelled for this morning.  [facility] will reevaluate appropriate testing time.

## 2018-10-19 NOTE — Progress Notes (Signed)
PT refuses to drink prep for EGD. Pt has been encouraged throughout the night the importance of preparing for test. Pt has drank less thank half of prep.

## 2018-10-19 NOTE — Progress Notes (Signed)
Patient blood sugar was 69. Patient was given juice and packets and sugar. CBG is now 87. Nurse will continue to monitor. Jacob Bass Jacob Bass

## 2018-10-19 NOTE — Interval H&P Note (Signed)
History and Physical Interval Note: Patient has been unable to drink bowel prep for the second day. He refuses to drink more of it to clear his colon. I discussed options with him and with Dr. Katherine Mantle. Given his history of cirrhosis will perform EGD today to clear the upper tract, assess for varices, given his need for antiplatelet therapy and risk stratify his chance for bleeding moving forward. Patient was agreeable to this following discussion of risks / benefits.    10/19/2018 8:34 AM  Jacob Bass  has presented today for surgery, with the diagnosis of anemia  The various methods of treatment have been discussed with the patient and family. After consideration of risks, benefits and other options for treatment, the patient has consented to  Procedure(s): ESOPHAGOGASTRODUODENOSCOPY (EGD) WITH PROPOFOL (N/A) as a surgical intervention .  The patient's history has been reviewed, patient examined, no change in status, stable for surgery.  I have reviewed the patient's chart and labs.  Questions were answered to the patient's satisfaction.     Viviann Spare P Kathalina Ostermann

## 2018-10-19 NOTE — Op Note (Signed)
[facility] Patient Name: Jacob Bass Procedure Date : 10/19/2018 MRN: [id number] Attending MD: Willaim Rayas. Giulliana Mcroberts , MD Date of Birth: [birth year] CSN: [id number] Age: 58 Admit Type: Inpatient Procedure:                Upper GI endoscopy Indications:              Heme positive stool, chronic anemia, no overt                            bleeding, recent CVA on aspirin with hopes to use                            Plavix, patient has not been able to prep for                            colonoscopy, history of cirrhosis rule out varices Providers:                Viviann Spare P. Adela Lank, MD, Bonney Leitz, Lawson Radar, Technician Referring MD:              Medicines:                Midazolam 3 mg IV, Fentanyl 50 micrograms IV Complications:            No immediate complications. Estimated blood loss:                            Minimal. Estimated Blood Loss:     Estimated blood loss was minimal. Procedure:                Pre-Anesthesia Assessment:                           - Prior to the procedure, a History and Physical                            was performed, and patient medications and                            allergies were reviewed. The patient's tolerance of                            previous anesthesia was also reviewed. The risks                            and benefits of the procedure and the sedation                            options and risks were discussed with the patient.                            All questions were answered, and informed consent  was obtained. Prior Anticoagulants: The patient has                            taken no previous anticoagulant or antiplatelet                            agents. ASA Grade Assessment: III - A patient with                            severe systemic disease. After reviewing the risks                            and benefits, the patient was deemed in                satisfactory condition to undergo the procedure.                           After obtaining informed consent, the endoscope was                            passed under direct vision. Throughout the                            procedure, the patient's blood pressure, pulse, and                            oxygen saturations were monitored continuously. The                            GIF-H190 ([id number] Olympus gastroscope was                            introduced through the mouth, and advanced to the                            second part of duodenum. The upper GI endoscopy was                            accomplished without difficulty. The patient                            tolerated the procedure well. Scope In: Scope Out: Findings:      Esophagogastric landmarks were identified: the Z-line was found at 37       cm, the gastroesophageal junction was found at 37 cm and the upper       extent of the gastric folds was found at 40 cm from the incisors.      A medium-sized hiatal hernia was present.      The exam of the esophagus was otherwise normal. No esophageal varices.      One non-bleeding cratered gastric ulcer with no stigmata of bleeding was       found in the gastric antrum. The lesion was 5 mm in largest dimension.       It was clean based. The surrouding area appeared inflamed with a  polypoid appearance that prolapsed a bit through the pylorus. I suspect       this may be benign hyperplastic change or reactive polypoid tissue but       biopsies were taken with a cold forceps for histology to ensure no       dysplasia or malignancy.      The exam of the stomach was otherwise normal.      Biopsies were taken with a cold forceps in the gastric body, at the       incisura and in the gastric antrum for Helicobacter pylori testing.      The duodenal bulb and second portion of the duodenum were normal. Impression:               - Esophagogastric landmarks identified.                            - Medium-sized hiatal hernia.                           - Normal esophagus otherwise, no esophageal varices                           - Non-bleeding gastric ulcer with no stigmata of                            bleeding on what I suspect may be benign                            hyperplastic polypoid / reactive tissue but                            biopsied to ensure no malignancy.                           - Normal stomach otherwise, no gastric varices,                            biopsies taken to rule out H Pylori.                           - Normal duodenal bulb and second portion of the                            duodenum. Moderate Sedation:      Moderate (conscious) sedation was administered by the endoscopy nurse       and supervised by the endoscopist. The patient's oxygen saturation,       heart rate, blood pressure and response to care were monitored. Total       physician intraservice time was 19 minutes. Recommendation:           - Return patient to hospital ward for ongoing care.                           - Advance diet as tolerated.                           -  Continue present medications (aspirin)                           - Increase protonix to 40mg  twice daily                           - Await pathology results. Ideally this patient                            should have a follow up endoscopy if he is able to                            (homelessness) in a few months                           - I am concerned about the potential risk for                            bleeding over time given endoscopic findings if                            Plavix is used right now with aspirin, in this                            patient where it may be difficult to monitor blood                            counts over time and for bleeding symptoms due to                            living situation, and concern for compliance with                            PPI. Will need  to discuss risks / benefits of                            adding Plavix and bleeding risk versus likelihood                            for recurrent CVA with the patient and primary                            service. I do think if he is reliable and can                            follow up and be monitored closed on PPI, Plavix                            can be used given the CVA, but I am not sure how                            likely that scenario is.  Further, it is possible he                            has lower tract pathology contributing to anemia as                            well but has not been able to do colonoscopy.                           - Colonoscopy has been recommended. Patient has not                            been able to drink bowel prep on 2 attempts to                            prepare for that. Procedure Code(s):        --- Professional ---                           [phone], Esophagogastroduodenoscopy, flexible,                            transoral; with biopsy, single or multiple                           99152, 59, Moderate sedation services provided by                            the same physician or other qualified health care                            professional performing the diagnostic or                            therapeutic service that the sedation supports,                            requiring the presence of an independent trained                            observer to assist in the monitoring of the                            patient's level of consciousness and physiological                            status; initial 15 minutes of intraservice time,                            patient age 64 years or older Diagnosis Code(s):        --- Professional ---                           K25.9, Gastric ulcer, unspecified as acute or  chronic, without hemorrhage or perforation                           R19.5, Other fecal  abnormalities CPT copyright 2018 [employer]. All rights reserved. The codes documented in this report are preliminary and upon coder review may  be revised to meet current compliance requirements. Viviann Spare P. Amyre Segundo, MD 10/19/2018 11:08:20 AM This report has been signed electronically. Number of Addenda: 0

## 2018-10-19 NOTE — Progress Notes (Signed)
[facility] PROGRESS NOTE    Progress Note  Jacob Bass  ZOX:[id number] DOB: 03/27/1961 DOA: 10/09/2018 PCP: Patient, No Pcp Per     Brief Narrative:   Jacob Bass is an 58 y.o. male past medical history significant for hypertension, heart failure tobacco abuse and homeless presents to the hospital after slipping and falling in the rain.  To the hospital his troponins were minimally elevated, cardiology is cleared the patient due to his anemia GI was consulted who proceeded with an EGD and colonoscopy.  The patient mental status continued to deteriorate and was noted to have a left upper extremity weakness with a facial droop and MRI of the brain was done that revealed 13 mm acute infarct on the right external capsule and corona radiata, with several additional punctuated acute early subacute infarcts present bilaterally in basal ganglia and right anterior basal ganglia showed chronic hemorrhagic infarction and advanced microvascular ischemic changes.  GI team is advised is okay to resume antiplatelet therapy.  Assessment/Plan:   Acute encephalopathy likely due to acute external capsule and basal ganglia infarct: Home on a statin, aspirin and Plavix for 3 months, then aspirin indefinitely as he is homeless and cannot afford his Plavix.  Metabolic encephalopathy: Likely multifactorial in the setting of volume depletion stroke alcohol liver cirrhosis and recurrent hypoglycemia.   Patient is back to baseline.  Episodes hypoglycemia: Likely due to decreased oral intake. Currently n.p.o.  Macrocytic anemia: Multifactorial likely due to alcohol and possible B12 deficiency. Patient refused colonoscopy prep, GI will do an EGD.  Elevated troponins: Setting of demand ischemia cardiology was consulted recommended no further work-up.  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 Will need  follow-up with GI as an outpatient.  Alcohol abuse: Continue Ativan protocol.  Status post fall: This is probably due to CVA. Physical therapy and occupational therapy evaluated the patient and recommended 24-hour supervision versus skilled nursing facility.  Essential hypertension: Pressure has remained stable continue lisinopril and metoprolol will need further titration as an outpatient as tolerated.  Chronic thrombocytopenia: Likely due to alcohol abuse.  Tobacco abuse: Counseling.  Choledocholithiasis: Follow-up as an outpatient.    New mild hypernatremia: Hypovolemic resolved with IV fluid hydration.   DVT prophylaxis:Heparin Family Communication:none Disposition Plan/Barrier to D/C: Hopefully tomorrow morning. Code Status:     Code Status Orders  (From admission, onward)         Start     Ordered   10/09/18 2345  Full code  Continuous     10/09/18 2344        Code Status History    This patient has a current code status but no historical code status.        IV Access:    Peripheral IV   Procedures and diagnostic studies:   No results found.   Medical Consultants:    None.  Anti-Infectives:   None  Subjective:    Jacob Bass he cannot finish his prep.  Objective:    Vitals:   10/19/18 0023 10/19/18 0426 10/19/18 0500 10/19/18 0752  BP: (!) 144/78 (!) 162/85  (!) 152/76  Pulse: (!) 58 64  60  Resp: 18 20  20   Temp: 98.9 F (37.2 C) 98.8 F (37.1 C)  99 F (37.2 C)  TempSrc: Axillary Oral  Oral  SpO2: 100% 100%  99%  Weight:   69.9 kg   Height:  Intake/Output Summary (Last 24 hours) at 10/19/2018 0757 Last data filed at 10/19/2018 0300 Gross per 24 hour  Intake 1840 ml  Output -  Net 1840 ml   Filed Weights   10/17/18 0500 10/18/18 0500 10/19/18 0500  Weight: 66.7 kg 68.5 kg 69.9 kg    Exam: General exam: In no acute distress. Respiratory system: Good air movement and clear to  auscultation. Cardiovascular system: Regular rate and rhythm with positive S1-S2 Gastrointestinal system: Abdomen is nondistended, soft and nontender.  Central nervous system: Alert and oriented. No focal neurological deficits. Extremities: No pedal edema. Skin: No rashes, lesions or ulcers    Data Reviewed:    Labs: Basic Metabolic Panel: Recent Labs  Lab 10/14/18 0459 10/15/18 0538 10/16/18 0548 10/17/18 0613 10/18/18 0645  NA 144 145 146* 143 138  K 3.6 3.4* 3.5 3.4* 4.0  CL 114* 118* 118* 118* 111  CO2 24 22 18* 22 22  GLUCOSE 106* 119* 133* 99 79  BUN 7 6 6 8 10   CREATININE 0.85 0.93 0.87 0.85 0.74  CALCIUM 7.9* 7.4* 7.7* 7.7* 7.6*  MG 1.8  --   --   --   --    GFR Estimated Creatinine Clearance: 87.3 mL/min (by C-G formula based on SCr of 0.74 mg/dL). Liver Function Tests: Recent Labs  Lab 10/13/18 1209  AST 77*  ALT 63*  ALKPHOS 63  BILITOT 1.5*  PROT 5.8*  ALBUMIN 2.2*   No results for input(s): LIPASE, AMYLASE in the last 168 hours. Recent Labs  Lab 10/13/18 1209 10/14/18 1222  AMMONIA 46* 22   Coagulation profile Recent Labs  Lab 10/13/18 1209  INR 1.33    CBC: Recent Labs  Lab 10/13/18 0517 10/14/18 0459 10/15/18 0538 10/16/18 0548  WBC 6.9 7.9 6.3 8.7  HGB 9.0* 9.9* 8.3* 9.0*  HCT 27.1* 29.2* 25.6* 26.4*  MCV 100.4* 102.1* 102.8* 102.3*  PLT 121* 136* 133* 148*   Cardiac Enzymes: No results for input(s): CKTOTAL, CKMB, CKMBINDEX, TROPONINI in the last 168 hours. BNP (last 3 results) No results for input(s): PROBNP in the last 8760 hours. CBG: Recent Labs  Lab 10/18/18 1630 10/18/18 1950 10/19/18 0018 10/19/18 0401 10/19/18 0646  GLUCAP 74 92 84 74 79   D-Dimer: No results for input(s): DDIMER in the last 72 hours. Hgb A1c: No results for input(s): HGBA1C in the last 72 hours. Lipid Profile: No results for input(s): CHOL, HDL, LDLCALC, TRIG, CHOLHDL, LDLDIRECT in the last 72 hours. Thyroid function studies: No  results for input(s): TSH, T4TOTAL, T3FREE, THYROIDAB in the last 72 hours.  Invalid input(s): FREET3 Anemia work up: No results for input(s): VITAMINB12, FOLATE, FERRITIN, TIBC, IRON, RETICCTPCT in the last 72 hours. Sepsis Labs: Recent Labs  Lab 10/13/18 0517 10/13/18 2334 10/14/18 0459 10/15/18 0538 10/16/18 0548  PROCALCITON  --  <0.10  --   --   --   WBC 6.9  --  7.9 6.3 8.7   Microbiology Recent Results (from the past 240 hour(s))  Culture, blood (routine x 2)     Status: None   Collection Time: 10/13/18  7:59 PM  Result Value Ref Range Status   Specimen Description BLOOD LEFT HAND  Final   Special Requests   Final    BOTTLES DRAWN AEROBIC AND ANAEROBIC Blood Culture adequate volume   Culture   Final    NO GROWTH 5 DAYS Performed at [facility], [address], [place], [place] [address]    Report  Status 10/18/2018 FINAL  Final  Culture, blood (routine x 2)     Status: None   Collection Time: 10/13/18  7:59 PM  Result Value Ref Range Status   Specimen Description BLOOD RIGHT HAND  Final   Special Requests   Final    BOTTLES DRAWN AEROBIC ONLY Blood Culture adequate volume   Culture   Final    NO GROWTH 5 DAYS Performed at [facility], [address] [place], [place] [address]    Report Status 10/18/2018 FINAL  Final  Culture, Urine     Status: Abnormal   Collection Time: 10/14/18 12:41 AM  Result Value Ref Range Status   Specimen Description URINE, CLEAN CATCH  Final   Special Requests   Final    NONE Performed at [facility], [address] [place], [place] [address]    Culture MULTIPLE SPECIES PRESENT, SUGGEST RECOLLECTION (A)  Final   Report Status 10/16/2018 FINAL  Final     Medications:   . amLODipine  2.5 mg Oral Daily  . aspirin  325 mg Per Tube Daily  . atorvastatin  40 mg Per Tube q1800  . feeding supplement (PRO-STAT SUGAR FREE 64)  30 mL Oral BID  . folic acid  1 mg Oral Daily  . heparin injection (subcutaneous)   5,000 Units Subcutaneous Q8H  . lisinopril  40 mg Oral Daily  . metoprolol tartrate  25 mg Per Tube BID  . multivitamin with minerals  1 tablet Oral Daily  . pantoprazole  40 mg Oral Daily  . polyethylene glycol powder  1 Container Oral Once  . sodium chloride flush  10-40 mL Intracatheter Q12H  . thiamine  100 mg Oral Daily   Continuous Infusions: . sodium chloride    . dextrose 75 mL/hr at 10/18/18 1146     LOS: 8 days   Eilert [place]ltree  [facility]  10/19/2018, 7:57 AM

## 2018-10-20 ENCOUNTER — Ambulatory Visit: Payer: Self-pay | Admitting: Adult Health

## 2018-10-20 DIAGNOSIS — K253 Acute gastric ulcer without hemorrhage or perforation: Secondary | ICD-10-CM

## 2018-10-20 DIAGNOSIS — I63411 Cerebral infarction due to embolism of right middle cerebral artery: Secondary | ICD-10-CM

## 2018-10-20 LAB — GLUCOSE, CAPILLARY
GLUCOSE-CAPILLARY: 76 mg/dL (ref 70–99)
Glucose-Capillary: 88 mg/dL (ref 70–99)
Glucose-Capillary: 89 mg/dL (ref 70–99)

## 2018-10-20 MED ORDER — METOPROLOL TARTRATE 25 MG PO TABS: 25.0000 mg | ORAL_TABLET | Freq: Two times a day (BID) | ORAL | Status: DC

## 2018-10-20 MED ORDER — ASPIRIN 81 MG PO TBEC: 81.0000 mg | DELAYED_RELEASE_TABLET | Freq: Every day | ORAL | 12 refills | Status: DC

## 2018-10-20 MED ORDER — PANTOPRAZOLE SODIUM 40 MG PO TBEC: 40.0000 mg | DELAYED_RELEASE_TABLET | Freq: Two times a day (BID) | ORAL | Status: DC

## 2018-10-20 MED ORDER — ATORVASTATIN CALCIUM 40 MG PO TABS: 40.0000 mg | ORAL_TABLET | Freq: Every day | ORAL | 0 refills | Status: DC

## 2018-10-20 MED ORDER — LISINOPRIL 40 MG PO TABS: 40.0000 mg | ORAL_TABLET | Freq: Every day | ORAL | Status: DC

## 2018-10-20 MED ORDER — AMLODIPINE BESYLATE 2.5 MG PO TABS: 2.5000 mg | ORAL_TABLET | Freq: Every day | ORAL | Status: DC

## 2018-10-20 NOTE — Progress Notes (Signed)
CSW spoke with patient's friend, Onalee Hua, to discuss SNF placement. Onalee Hua is in agreement. CSW discussed LOG placement as patient has no insurance, and Onalee Hua acknowledged understanding. CSW completed referral and faxed to LOG facilities.  CSW to follow.  Blenda Nicely, [place] Clinical Social Worker [phone]

## 2018-10-20 NOTE — Discharge Summary (Signed)
Physician Discharge Summary  Jacob Bass ZOX:[id number] DOB: 1961/06/01 DOA: 10/09/2018  PCP: Patient, No Pcp Per  Admit date: 10/09/2018 Discharge date: 10/20/2018  Admitted From: Home Disposition:  SNF  Recommendations for Outpatient Follow-up:  1. Follow up with PCP in 1-2 weeks 2. Follow-up with neurology as an outpatient in 1 week.  Home Health:No Equipment/Devices:None  Discharge Condition:stable CODE STATUS:Full Diet recommendation: Heart Healthy  Brief/Interim Summary: 58 y.o. male past medical history significant for hypertension, heart failure tobacco abuse and homeless presents to the hospital after slipping and falling in the rain.  To the hospital his troponins were minimally elevated, cardiology is cleared the patient due to his anemia GI was consulted who proceeded with an EGD and colonoscopy.  The patient mental status continued to deteriorate and was noted to have a left upper extremity weakness with a facial droop and MRI of the brain was done that revealed 13 mm acute infarct on the right external capsule and corona radiata, with several additional punctuated acute early subacute infarcts present bilaterally in basal ganglia and right anterior basal ganglia showed chronic hemorrhagic infarction and advanced microvascular ischemic changes.  GI team is advised is okay to resume antiplatelet therapy.  Discharge Diagnoses:  Principal Problem:   Anemia, macrocytic Active Problems:   Chest pain   Hepatitis C antibody positive in blood   EKG abnormalities   Homelessness   Blood pressure elevated without history of HTN   Thrombocytopenia (HCC)   Cerebral thrombosis with cerebral infarction   Cerebral embolism with cerebral infarction   Subarachnoid hemorrhage   Intracerebral hemorrhage   Heme positive stool   Cirrhosis of liver without ascites (HCC)   Gastric ulcer Acute external capsule and basal ganglia infarct: MRI showed results as below. Neurology was consulted  recommended aspirin 81 mg coated as he had a nonbleeding ulcer and his reliability see below for further details.  Acute metabolic encephalopathy: Likely multifactorial in the setting of volume depletion, stroke and hypoglycemia. Resolved with treatments of etiologies.  Episode of hypoglycemia: Likely due to decreased oral intake.  Macrocytic anemia/peptic ulcer disease.: Likely multifactorial in the setting of alcohol and possible B12 deficiency. As there was a mild drift in his hemoglobin and the patient was going to be sent out on aspirin and Plavix, GI was consulted who recommended an EGD and colonoscopy the patient refused to take the prep for the colonoscopy. An EGD was done that showed a nonbleeding clean-based ulcer. We were concerned concerned about the potential risk for over time given the findings on endoscopy and the use of aspirin and Plavix.  Not to mention his compliance with his Protonix or his medications.  Due to the fact that we were not be able to reliably follow his hemoglobin.  It was discussed with neurology.  And due to the financial reasons, he will be sent out on a coated aspirin 81 mg and follow-up with PCP as an outpatient.  The risk and benefits were discussed with the patient about being on aspirin and Plavix and he expressed understanding. He will go home on aspirin 81 mg coated.  Plus Protonix twice a day. It is possible he has an occult lower track pathology which is contributing to his anemia but he refused to drink the prep for the colonoscopy.  Elevated troponins: In the setting of demand ischemia cardiology recommended no further work-up.  Hepatitis C antibody positive/cirrhosis of the liver without ascites: We will need to follow-up with his primary GI as an outpatient.  Alcohol abuse: No signs of withdrawal.  He was on admission he was started on thiamine and folate.  Status post fall: Likely due to CVA physical therapy evaluated the patient  recommended skilled nursing facility.  Essential hypertension: His blood pressure remain high he was started on low-dose Norvasc, he lisinopril was increased and he will continue metoprolol. He will need follow-up as an outpatient and titrate antihypertensive medications as needed.  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  Cholelithiasis: Follow-up with primary care doctor as an outpatient.  He remained asymptomatic.  Mild hypernatremia: Likely due to hypovolemia resolved with IV fluid hydration.                         Discharge Instructions  Discharge Instructions    Ambulatory referral to Neurology   Complete by:  As directed    Follow up with stroke clinic NP (Jessica Vanschaick or Darrol Angel, if both not available, consider Dr. Delia Heady, Dr. Jamelle Rushing, or Dr. Naomie Dean) at [facility] in about 4 weeks.   Diet - low sodium heart healthy   Complete by:  As directed    Increase activity slowly   Complete by:  As directed      Allergies as of 10/20/2018      Reactions   Penicillins Hives      Medication List    TAKE these medications   amLODipine 2.5 MG tablet Commonly known as:  NORVASC Take 1 tablet (2.5 mg total) by mouth daily.   aspirin 81 MG EC tablet Take 1 tablet (81 mg total) by mouth daily. Swallow whole.   atorvastatin 40 MG tablet Commonly known as:  LIPITOR Place 1 tablet (40 mg total) into feeding tube daily at 6 PM.   lisinopril 40 MG tablet Commonly known as:  PRINIVIL,ZESTRIL Take 1 tablet (40 mg total) by mouth daily. What changed:    medication strength  how much to take   metoprolol tartrate 25 MG tablet Commonly known as:  LOPRESSOR Take 1 tablet (25 mg total) by mouth 2 (two) times daily.   pantoprazole 40 MG tablet Commonly known as:  PROTONIX Take 1 tablet (40 mg total) by mouth 2 (two) times daily.      Follow-up Information    [facility] Follow up.   Specialty:   Cardiology Why:  You have a hospital follow-up scheduled for 10/20/2018 at 9:30am with Lorin Picket, one of the Nurse Practitioners with our office. Please arrive 15 minutes early for check in. Contact information: [address] [place] [address] [phone]       [facility] Follow up in 4 week(s).   Specialty:  Neurology Why:  stroke clinic. office will call with appt date and time Contact information: [address] [place] [address] [phone]         Allergies  Allergen Reactions  . Penicillins Hives    Consultations:  Neurology  Gastroenterology   Procedures/Studies: Ct Angio Head W Or Wo Contrast  Result Date: 10/13/2018 CLINICAL DATA:  58 y/o  M; stroke for follow-up. EXAM: CT ANGIOGRAPHY HEAD AND NECK TECHNIQUE: Multidetector CT imaging of the head and neck was performed using the standard protocol during bolus administration of intravenous contrast. Multiplanar CT image reconstructions and MIPs were obtained to evaluate the vascular anatomy. Carotid stenosis measurements (when applicable) are obtained utilizing NASCET criteria, using the distal internal carotid diameter as the denominator.  CONTRAST:  75mL ISOVUE-370 IOPAMIDOL (ISOVUE-370) INJECTION 76% COMPARISON:  10/12/2018 CT head. 10/13/2018 MRI head. FINDINGS: CT HEAD FINDINGS Brain: Stable hypodensity within the right external capsule and corona radiata corresponding to infarction on prior MRI of the brain. Additional punctate infarcts are poorly visualized on CT. Stable chronic infarct in the right anterior basal ganglia. Stable advanced chronic microvascular ischemic changes of white matter and moderate volume loss of the brain. No new stroke, hemorrhage, mass effect, extra-axial collection, or herniation identified. Vascular: As below. Skull: Normal. Negative for fracture or focal lesion. Sinuses: Left mastoid tip opacification.  Additional visible paranasal sinuses and the mastoid air cells are normally aerated. Orbits: No acute finding. Review of the MIP images confirms the above findings CTA NECK FINDINGS Aortic arch: Bovine very branching. Imaged portion shows no evidence of aneurysm or dissection. No significant stenosis of the major arch vessel origins. Moderate mixed plaque of the aortic arch. Right carotid system: No evidence of dissection, stenosis (50% or greater) or occlusion. Mixed plaque of the carotid bifurcation with mild less than 50% proximal ICA stenosis. Left carotid system: No evidence of dissection, stenosis (50% or greater) or occlusion. Vertebral arteries: Right vertebral artery segment of fibrofatty plaque with mild to moderate 50% stenosis just downstream to the vertebral body origin. No additional segment of hemodynamically significant stenosis, aneurysm, occlusion, or dissection. Skeleton: C3-4 and C4-5 grade 1 anterolisthesis. Mild spondylosis of the cervical spine. No high-grade bony spinal canal stenosis. No acute osseous abnormality is evident. Other neck: Negative. Upper chest: Negative. Review of the MIP images confirms the above findings CTA HEAD FINDINGS Anterior circulation: Bilateral carotid siphon calcified plaque. Mild less than 50% distal left cavernous ICA stenosis. Left A2 mild stenosis. No large vessel occlusion, aneurysm, vascular malformation, or additional segment of significant stenosis. Posterior circulation: No significant stenosis, proximal occlusion, aneurysm, or vascular malformation. Mild mid basilar stenosis. Mild bilateral P1 stenosis. Venous sinuses: As permitted by contrast timing, patent. Anatomic variants: Large right A1, large anterior communicating artery, diminutive left A1, normal variant. Associated asymmetric small left-sided left carotid system perfusing the left MCA distribution. Delayed phase: No abnormal intracranial enhancement. Review of the MIP images confirms the above  findings IMPRESSION: CT head: 1. Stable hypodensity within the right external capsule and corona radiata corresponding to infarction on prior MRI of the brain. Additional punctate infarcts are poorly visualized on CT. No new acute intracranial abnormality identified. 2. Stable chronic infarct in the right anterior basal ganglia. Stable advanced chronic microvascular ischemic changes of white matter and moderate volume loss of the brain. CTA NECK:. 1. Patent carotid and vertebral arteries. No high-grade stenosis by NASCET criteria, dissection, or aneurysm. 2. Right proximal vertebral artery mild-to-moderate 50% stenosis with fibrofatty plaque. 3. Right proximal ICA mild less than 50% stenosis with mixed plaque. CTA HEAD:. 1. Patent Circle of Willis. No large vessel occlusion, aneurysm, or vascular malformation identified. 2. Intracranial atherosclerosis with multiple segments of mild stenosis in the anterior and posterior circulation. Electronically Signed   By: Mitzi Hansen M.D.   On: 10/13/2018 22:46   Dg Chest 2 View  Result Date: 10/13/2018 CLINICAL DATA:  Aspiration pneumonia EXAM: CHEST - 2 VIEW COMPARISON:  10/09/2018, 05/17/2006 FINDINGS: The heart size and mediastinal contours are within normal limits. Aortic atherosclerosis. Both lungs are clear. The visualized skeletal structures are unremarkable. IMPRESSION: No active cardiopulmonary disease. Electronically Signed   By: Jasmine Pang M.D.   On: 10/13/2018 22:14   Dg Chest 2 View  Result Date:  10/09/2018 CLINICAL DATA:  Chest pain, fall today.  History of CHF. EXAM: CHEST - 2 VIEW COMPARISON:  Chest radiograph May 17, 2016 FINDINGS: Cardiac silhouette is upper limits of normal size. Mild pulmonary vascular congestion without pleural effusion or focal consolidation. Biapical pleural thickening. No pneumothorax. Upper thoracic dextroscoliosis. IMPRESSION: Borderline cardiomegaly and mild pulmonary vascular congestion. Electronically  Signed   By: Awilda Metro M.D.   On: 10/09/2018 15:33   Dg Abd 1 View  Result Date: 10/14/2018 CLINICAL DATA:  Feeding tube placement. EXAM: ABDOMEN - 1 VIEW COMPARISON:  None. FINDINGS: The long GI feeding tube tip is in the third portion of the duodenum. IMPRESSION: Feeding tube tip is in the third portion of the duodenum. Electronically Signed   By: Rudie Meyer M.D.   On: 10/14/2018 17:30   Ct Angio Neck W Or Wo Contrast  Result Date: 10/13/2018 CLINICAL DATA:  58 y/o  M; stroke for follow-up. EXAM: CT ANGIOGRAPHY HEAD AND NECK TECHNIQUE: Multidetector CT imaging of the head and neck was performed using the standard protocol during bolus administration of intravenous contrast. Multiplanar CT image reconstructions and MIPs were obtained to evaluate the vascular anatomy. Carotid stenosis measurements (when applicable) are obtained utilizing NASCET criteria, using the distal internal carotid diameter as the denominator. CONTRAST:  75mL ISOVUE-370 IOPAMIDOL (ISOVUE-370) INJECTION 76% COMPARISON:  10/12/2018 CT head. 10/13/2018 MRI head. FINDINGS: CT HEAD FINDINGS Brain: Stable hypodensity within the right external capsule and corona radiata corresponding to infarction on prior MRI of the brain. Additional punctate infarcts are poorly visualized on CT. Stable chronic infarct in the right anterior basal ganglia. Stable advanced chronic microvascular ischemic changes of white matter and moderate volume loss of the brain. No new stroke, hemorrhage, mass effect, extra-axial collection, or herniation identified. Vascular: As below. Skull: Normal. Negative for fracture or focal lesion. Sinuses: Left mastoid tip opacification. Additional visible paranasal sinuses and the mastoid air cells are normally aerated. Orbits: No acute finding. Review of the MIP images confirms the above findings CTA NECK FINDINGS Aortic arch: Bovine very branching. Imaged portion shows no evidence of aneurysm or dissection. No  significant stenosis of the major arch vessel origins. Moderate mixed plaque of the aortic arch. Right carotid system: No evidence of dissection, stenosis (50% or greater) or occlusion. Mixed plaque of the carotid bifurcation with mild less than 50% proximal ICA stenosis. Left carotid system: No evidence of dissection, stenosis (50% or greater) or occlusion. Vertebral arteries: Right vertebral artery segment of fibrofatty plaque with mild to moderate 50% stenosis just downstream to the vertebral body origin. No additional segment of hemodynamically significant stenosis, aneurysm, occlusion, or dissection. Skeleton: C3-4 and C4-5 grade 1 anterolisthesis. Mild spondylosis of the cervical spine. No high-grade bony spinal canal stenosis. No acute osseous abnormality is evident. Other neck: Negative. Upper chest: Negative. Review of the MIP images confirms the above findings CTA HEAD FINDINGS Anterior circulation: Bilateral carotid siphon calcified plaque. Mild less than 50% distal left cavernous ICA stenosis. Left A2 mild stenosis. No large vessel occlusion, aneurysm, vascular malformation, or additional segment of significant stenosis. Posterior circulation: No significant stenosis, proximal occlusion, aneurysm, or vascular malformation. Mild mid basilar stenosis. Mild bilateral P1 stenosis. Venous sinuses: As permitted by contrast timing, patent. Anatomic variants: Large right A1, large anterior communicating artery, diminutive left A1, normal variant. Associated asymmetric small left-sided left carotid system perfusing the left MCA distribution. Delayed phase: No abnormal intracranial enhancement. Review of the MIP images confirms the above findings IMPRESSION: CT head:  1. Stable hypodensity within the right external capsule and corona radiata corresponding to infarction on prior MRI of the brain. Additional punctate infarcts are poorly visualized on CT. No new acute intracranial abnormality identified. 2. Stable  chronic infarct in the right anterior basal ganglia. Stable advanced chronic microvascular ischemic changes of white matter and moderate volume loss of the brain. CTA NECK:. 1. Patent carotid and vertebral arteries. No high-grade stenosis by NASCET criteria, dissection, or aneurysm. 2. Right proximal vertebral artery mild-to-moderate 50% stenosis with fibrofatty plaque. 3. Right proximal ICA mild less than 50% stenosis with mixed plaque. CTA HEAD:. 1. Patent Circle of Willis. No large vessel occlusion, aneurysm, or vascular malformation identified. 2. Intracranial atherosclerosis with multiple segments of mild stenosis in the anterior and posterior circulation. Electronically Signed   By: Mitzi Hansen M.D.   On: 10/13/2018 22:46   Mr Brain Wo Contrast  Result Date: 10/13/2018 CLINICAL DATA:  58 y/o M; altered level of consciousness, stroke for follow-up. EXAM: MRI HEAD WITHOUT CONTRAST TECHNIQUE: Axial DWI, coronal DWI, sagittal T1, axial T2 blade sequences were acquired. The patient was unable to continue and additional sequences were not acquired. COMPARISON:  10/12/2018 CT head FINDINGS: 13 mm focus of reduced diffusion within the right external capsule and corona radiata. Several additional punctate foci of reduced diffusion are present within the lentiform nuclei bilaterally. Findings are consistent with acute/early subacute infarctions. 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 Large confluent nonspecific T2 hyperintensities on the low B value sequence in subcortical and periventricular white matter are compatible with advanced chronic microvascular ischemic changes. Moderate volume loss of the brain. Volume loss of the brain. No gross mass effect, hydrocephalus, or herniation. Extensive motion degradation of sagittal T1 and axial T2 weighted sequences. IMPRESSION: 1. Limited motion degraded study. 2. 13 mm acute/early subacute infarction within right external capsule and  corona radiata. Several additional punctate acute/early subacute infarcts are present in bilateral basal ganglia. 3. 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 Advanced chronic microvascular ischemic changes and moderate volume loss of the brain. Electronically Signed   By: Mitzi Hansen M.D.   On: 10/13/2018 03:27   US Abdomen Complete  Result Date: 10/12/2018 CLINICAL DATA:  Cirrhosis. EXAM: ABDOMEN ULTRASOUND COMPLETE COMPARISON:  None. FINDINGS: Gallbladder: Numerous small gallstones are noted in the gallbladder with associated acoustic shadowing. The largest calculus measures 8 mm. Diffusely thickened gallbladder wall but no pericholecystic fluid and negative sonographic Murphy sign. Common bile duct: Diameter: 2.8 mm Liver: Very irregular/nodular liver contour with heterogeneous echogenicity consistent with cirrhosis. No focal hepatic lesions or intrahepatic biliary dilatation. Portal vein is patent on color Doppler imaging with normal direction of blood flow towards the liver. IVC: Normal caliber. Pancreas: Poorly visualized. Spleen: Normal size.  No focal lesions. Right Kidney: Length: 9.5 x 5.9 x 5.6 cm. Normal renal cortical thickness and echogenicity without focal lesions or hydronephrosis. Left Kidney: Length: 8.4 x 3.7 x 3.9 cm. Normal renal cortical thickness and echogenicity without focal lesions or hydronephrosis. Abdominal aorta: Poorly visualized. Other findings: No ascites. IMPRESSION: 1. Cirrhotic changes involving the liver but no focal hepatic lesion is identified. 2. Gallstones and diffuse gallbladder wall thickening but no pericholecystic fluid or sonographic Murphy sign. 3. No intra or extrahepatic biliary dilatation. 4. Poor visualization of the pancreas and aorta. 5. No splenomegaly or ascites. Electronically Signed   By: Rudie Meyer M.D.   On: 10/12/2018 04:28   Dg Swallowing Func-speech Pathology  Result Date: 10/16/2018 Objective Swallowing  Evaluation:  Type of Study: MBS-Modified Barium Swallow Study  Patient Details Name: Lindwood Mogel MRN: [id number] Date of Birth: 04-25-1961 Today's Date: 10/16/2018 Time: SLP Start Time (ACUTE ONLY): 1000 -SLP Stop Time (ACUTE ONLY): 1030 SLP Time Calculation (min) (ACUTE ONLY): 30 min Past Medical History: Past Medical History: Diagnosis Date . CHF (congestive heart failure) (HCC)  . Hypertension  Past Surgical History: No past surgical history on file. HPI: 58 y.o. male with past medical history significant for hypertension, tobacco abuse, and homelessness; who presented 2/8 after slipping and falling in the rain.  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   On 2/9. pt presented with neuro changes with left sided weakness and dysarthria due to actue right ext capsule/basal ganglia/corona radiata CVA. EGD/colonscopy pending, but on hold due to changes in MS. Cortrak placed in IR 2/11. MRI 2/9: 13 mm acute/early subacute infarction within right external capsule and corona radiata. Several additional punctate acute/early subacute infarcts are present in bilateral basal ganglia. 3. 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 Advanced chronic microvascular ischemic changes and moderate volume loss of the brain.  Subjective: alert Assessment / Plan / Recommendation CHL IP CLINICAL IMPRESSIONS 10/16/2018 Clinical Impression Pt presents with a primary oral dysphagia with marked sensory loss on left, leading to residue in left lateral sulcus, spillage from left side of mouth, and no awareness of deficits.  There is impaired oral containment and bolus propulsion.  However, once bolus material reaches the pharynx, a swallow is triggered and there is reliable laryngeal vestibule closure.  There is mild vallecular residue of purees.  No aspiration nor penetration observed.  Testing was not ideal - pt had difficulty attending to instructions and required constant cues to remain adequately  positioned.  For today, recommend initiating a dysphagia 1 diet with thin liquids; meds should be crushed and given with applesauce.  D/C cortrak.  D/W Dr. Radonna Ricker.  SLP will follow for education, safety.  SLP Visit Diagnosis Dysphagia, oral phase (R13.11) Attention and concentration deficit following -- Frontal lobe and executive function deficit following -- Impact on safety and function --   [facility] IP TREATMENT RECOMMENDATION 10/16/2018 Treatment Recommendations Therapy as outlined in treatment plan below   No flowsheet data found. [facility] IP DIET RECOMMENDATION 10/16/2018 SLP Diet Recommendations Dysphagia 1 (Puree) solids;Thin liquid Liquid Administration via Cup;Straw Medication Administration Crushed with puree Compensations Minimize environmental distractions;Lingual sweep for clearance of pocketing Postural Changes --   CH[facility] IP OTHER RECOMMENDATIONS 10/16/2018 Recommended Consults -- Oral Care Recommendations Oral care BID Other Recommendations --   [facility] IP FOLLOW UP RECOMMENDATIONS 10/16/2018 Follow up Recommendations Skilled Nursing facility   [facility] IP FREQUENCY AND DURATION 10/16/2018 Speech Therapy Frequency (ACUTE ONLY) min 3x week Treatment Duration 1 week      CH[facility] IP ORAL PHASE 10/16/2018 Oral Phase Impaired Oral - Pudding Teaspoon -- Oral - Pudding Cup -- Oral - Honey Teaspoon -- Oral - Honey Cup -- Oral - Nectar Teaspoon -- Oral - Nectar Cup -- Oral - Nectar Straw -- Oral - Thin Teaspoon Weak lingual manipulation;Reduced posterior propulsion;Left pocketing in lateral sulci;Lingual/palatal residue;Delayed oral transit;Decreased bolus cohesion;Left anterior bolus loss Oral - Thin Cup Left anterior bolus loss;Weak lingual manipulation;Left pocketing in lateral sulci;Delayed oral transit;Decreased bolus cohesion Oral - Thin Straw Left anterior bolus loss;Weak lingual manipulation;Reduced posterior propulsion;Left pocketing in lateral sulci;Delayed oral transit;Decreased bolus cohesion Oral - Puree Weak lingual  manipulation;Holding of bolus;Left pocketing in lateral sulci;Delayed oral transit;Decreased bolus cohesion Oral - Mech Soft --  Oral - Regular -- Oral - Multi-Consistency -- Oral - Pill -- Oral Phase - Comment --  CHL IP PHARYNGEAL PHASE 10/16/2018 Pharyngeal Phase Impaired Pharyngeal- Pudding Teaspoon -- Pharyngeal -- Pharyngeal- Pudding Cup -- Pharyngeal -- Pharyngeal- Honey Teaspoon -- Pharyngeal -- Pharyngeal- Honey Cup -- Pharyngeal -- Pharyngeal- Nectar Teaspoon -- Pharyngeal -- Pharyngeal- Nectar Cup -- Pharyngeal -- Pharyngeal- Nectar Straw -- Pharyngeal -- Pharyngeal- Thin Teaspoon -- Pharyngeal -- Pharyngeal- Thin Cup -- Pharyngeal -- Pharyngeal- Thin Straw -- Pharyngeal -- Pharyngeal- Puree Pharyngeal residue - valleculae Pharyngeal -- Pharyngeal- Mechanical Soft -- Pharyngeal -- Pharyngeal- Regular -- Pharyngeal -- Pharyngeal- Multi-consistency -- Pharyngeal -- Pharyngeal-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

## 2018-10-20 NOTE — Progress Notes (Signed)
Occupational Therapy Treatment Patient Details Name: Jacob Bass MRN: [id number] DOB: May 06, 1961 Today's Date: 10/20/2018    History of present illness Jacob Bass is an 58 y.o. male past medical history significant for hypertension, heart failure tobacco abuse and homeless presented with elevated troponins and question GI blood loss developed AMS and L side weakness after GI testing,  MRI with 13 mm acute infarct on the right external capsule and corona radiata, with several additional punctuated acute early subacute infarcts present bilaterally in basal ganglia and right anterior basal ganglia showed chronic hemorrhagic infarction and advanced microvascular ischemic changes.  GI team is advised is okay to resume antiplatelet therapy.   OT comments  Pt still needing mod assist for stand pivot transfers to the bed, toilet, and chair with use of the RW for support.  He also demonstrates moderate hemiparesis in the LUE and LLE as well, noted with decreased ability to hold onto the RW with functional transfers or advance the LLE efficiently.  Feel he will continue to need extensive rehab at [facility] for follow-up therapy.    Follow Up Recommendations  SNF;Supervision/Assistance - 24 hour    Equipment Recommendations  3 in 1 bedside commode       Precautions / Restrictions Precautions Precautions: Fall Restrictions Weight Bearing Restrictions: No       Mobility Bed Mobility Overal bed mobility: Needs Assistance Bed Mobility: Sidelying to Sit;Rolling Rolling: Min assist Sidelying to sit: Mod assist   Sit to supine: Min assist   General bed mobility comments: Pt needed mod instructional cueing and mod assist for sequencing of bed mobility.   Transfers Overall transfer level: Needs assistance Equipment used: Rolling walker (2 wheeled) Transfers: Stand Pivot Transfers Sit to Stand: Mod assist Stand pivot transfers: Mod assist       General transfer comment: Pt unable to maintain the  left hand on the RW secondary to decreased grip.   He also needed mod assist to stay closer and into the RW as well.     Balance Overall balance assessment: Needs assistance Sitting-balance support: Feet supported Sitting balance-Leahy Scale: Fair     Standing balance support: Bilateral upper extremity supported Standing balance-Leahy Scale: Poor Standing balance comment: Pt needs therapist assist for standing balance and with functional mobility.                            ADL either performed or assessed with clinical judgement   ADL Overall ADL's : Needs assistance/impaired                         Toilet Transfer: Moderate assistance;BSC;Ambulation;RW   Toileting- Clothing Manipulation and Hygiene: Moderate assistance;Sit to/from stand         General ADL Comments: Pt still with dirty peri area in bed, so had him transfer from supine to sit with mod assist.  Once sitting he was able to maintain sitting balance with supervision.  Mod assist for sit to stand and for cleaning peri area in standing.  Had him also complete 1 set of 10 repetitions for shoulder flexion AAROM and elbow flexion AAROM for the LUE.  Plan for discharge to SNF for further rehab soon.                 Cognition Arousal/Alertness: Awake/alert Behavior During Therapy: WFL for tasks assessed/performed Overall Cognitive Status: Impaired/Different from baseline Area of Impairment: Orientation;Awareness  Orientation Level: Place;Time;Situation     Following Commands: Follows one step commands consistently Safety/Judgement: Decreased awareness of deficits   Problem Solving: Slow processing General Comments: Pt not oriented to time (month or day of the week) or situation.  When told about having a stroke, he did not state anything regarding the weakness in his LUE or LLE until asked.  He then stated maybe a little, but to him it was not significant.                        Pertinent Vitals/ Pain       Pain Assessment: No/denies pain         Frequency  Min 2X/week        Progress Toward Goals  OT Goals(current goals can now be found in the care plan section)  Progress towards OT goals: Progressing toward goals     Plan Discharge plan remains appropriate       AM-PAC OT "6 Clicks" Daily Activity     Outcome Measure   Help from another person eating meals?: A Little Help from another person taking care of personal grooming?: A Little Help from another person toileting, which includes using toliet, bedpan, or urinal?: A Lot Help from another person bathing (including washing, rinsing, drying)?: A Lot Help from another person to put on and taking off regular upper body clothing?: A Lot Help from another person to put on and taking off regular lower body clothing?: A Lot 6 Click Score: 14    End of Session Equipment Utilized During Treatment: Rolling walker  OT Visit Diagnosis: Unsteadiness on feet (R26.81);Muscle weakness (generalized) (M62.81);Hemiplegia and hemiparesis Hemiplegia - Right/Left: Left Hemiplegia - dominant/non-dominant: Non-Dominant Hemiplegia - caused by: Cerebral infarction   Activity Tolerance Patient tolerated treatment well   Patient Left in bed;with call bell/phone within reach;with bed alarm set   Nurse Communication Mobility status        Time: 8938-1017 OT Time Calculation (min): 28 min  Charges: OT General Charges $OT Visit: 1 Visit OT Treatments $Self Care/Home Management : 23-37 mins   Milo Solana OTR/L 10/20/2018, 10:50 AM

## 2018-10-20 NOTE — NC FL2 (Signed)
[place] MEDICAID FL2 LEVEL OF CARE SCREENING TOOL     IDENTIFICATION  Patient Name: Jacob Bass Birthdate: 1961-06-03 Sex: male Admission Date (Current Location): 10/09/2018  [facility] and [place] Number:  Producer, television/film/video and Address:  [employer], [address] [place], [place] [address]      Provider Number:    Attending Physician Name and Address:  Marinda Elk, MD  Relative Name and Phone Number:       Current Level of Care: Hospital Recommended Level of Care: Skilled Nursing Facility Prior Approval Number:    Date Approved/Denied:   PASRR Number: [id number]  Discharge Plan: SNF    Current Diagnoses: Patient Active Problem List   Diagnosis Date Noted  . Gastric ulcer   . Cerebral thrombosis with cerebral infarction 10/13/2018  . Cerebral embolism with cerebral infarction 10/13/2018  . Subarachnoid hemorrhage 10/13/2018  . Intracerebral hemorrhage 10/13/2018  . Heme positive stool   . Cirrhosis of liver without ascites (HCC)   . Chest pain 10/09/2018  . Hepatitis C antibody positive in blood 10/09/2018  . EKG abnormalities 10/09/2018  . Homelessness 10/09/2018  . Blood pressure elevated without history of HTN 10/09/2018  . Anemia, macrocytic 10/09/2018  . Thrombocytopenia (HCC) 10/09/2018    Orientation RESPIRATION BLADDER Height & Weight     Self, Place  Normal Incontinent Weight: 154 lb (69.9 kg) Height:  5\' 3"  (160 cm)  BEHAVIORAL SYMPTOMS/MOOD NEUROLOGICAL BOWEL NUTRITION STATUS      Incontinent Diet(see DC summary)  AMBULATORY STATUS COMMUNICATION OF NEEDS Skin   Extensive Assist Verbally Normal                       Personal Care Assistance Level of Assistance  Bathing, Feeding, Dressing Bathing Assistance: Maximum assistance Feeding assistance: Limited assistance Dressing Assistance: Maximum assistance     Functional Limitations Info  Sight, Hearing, Speech Sight Info: Impaired Hearing Info:  Adequate Speech Info: Adequate    SPECIAL CARE FACTORS FREQUENCY  PT (By licensed PT), OT (By licensed OT), Speech therapy     PT Frequency: 5x/wk OT Frequency: 5x/wk     Speech Therapy Frequency: 5x/wk      Contractures Contractures Info: Not present    Additional Factors Info  Code Status, Allergies Code Status Info: Full Allergies Info: Penicillins           Current Medications (10/20/2018):  This is the current hospital active medication list Current Facility-Administered Medications  Medication Dose Route Frequency Provider Last Rate Last Dose  . acetaminophen (TYLENOL) tablet 650 mg  650 mg Oral Q4H PRN Rometta Emery, MD   650 mg at 10/10/18 0048  . amLODipine (NORVASC) tablet 2.5 mg  2.5 mg Oral Daily Marinda Elk, MD   2.5 mg at 10/20/18 1039  . aspirin tablet 325 mg  325 mg Per Tube Daily Marinda Elk, MD   325 mg at 10/20/18 1248  . atorvastatin (LIPITOR) tablet 40 mg  40 mg Per Tube q1800 Marinda Elk, MD   40 mg at 10/19/18 1804  . dextrose 5 % solution   Intravenous Continuous Marinda Elk, MD 75 mL/hr at 10/18/18 1146    . feeding supplement (PRO-STAT SUGAR FREE 64) liquid 30 mL  30 mL Oral BID Marinda Elk, MD   30 mL at 10/19/18 2117  . folic acid (FOLVITE) tablet 1 mg  1 mg Oral Daily Clydie Braun, MD  1 mg at 10/20/18 1039  . heparin injection 5,000 Units  5,000 Units Subcutaneous Q8H Hyacinth Meeker [place], [place]   5,000 Units at 10/20/18 1350  . lisinopril (PRINIVIL,ZESTRIL) tablet 40 mg  40 mg Oral Daily Marinda Elk, MD   40 mg at 10/20/18 1039  . metoprolol tartrate (LOPRESSOR) tablet 25 mg  25 mg Per Tube BID Berton Mount I, MD   25 mg at 10/20/18 1039  . multivitamin with minerals tablet 1 tablet  1 tablet Oral Daily Madelyn Flavors A, MD   1 tablet at 10/20/18 1039  . ondansetron (ZOFRAN) injection 4 mg  4 mg Intravenous Q6H PRN Earlie Lou L, MD      . pantoprazole (PROTONIX) EC tablet 40  mg  40 mg Oral BID Benancio Deeds, MD   40 mg at 10/20/18 1039  . polyethylene glycol powder (GLYCOLAX/MIRALAX) container 255 g  1 Container Oral Once Armbruster, Willaim Rayas, MD      . sodium chloride flush (NS) 0.9 % injection 10-40 mL  10-40 mL Intracatheter Q12H Marinda Elk, MD   10 mL at 10/20/18 1052  . sodium chloride flush (NS) 0.9 % injection 10-40 mL  10-40 mL Intracatheter PRN Marinda Elk, MD      . thiamine (VITAMIN B-1) tablet 100 mg  100 mg Oral Daily Madelyn Flavors A, MD   100 mg at 10/20/18 1039     Discharge Medications: Please see discharge summary for a list of discharge medications.  Relevant Imaging Results:  Relevant Lab Results:   Additional Information SS#: [id number]  Baldemar Lenis, LCSW

## 2018-10-20 NOTE — Progress Notes (Signed)
Physical Therapy Treatment Patient Details Name: Averi Ruffolo MRN: [id number] DOB: [birth year] Today's Date: 10/20/2018    History of Present Illness Creg Brinkworth is an 58 y.o. male past medical history significant for hypertension, heart failure tobacco abuse and homeless presented with elevated troponins and question GI blood loss developed AMS and L side weakness after GI testing,  MRI with 13 mm acute infarct on the right external capsule and corona radiata, with several additional punctuated acute early subacute infarcts present bilaterally in basal ganglia and right anterior basal ganglia showed chronic hemorrhagic infarction and advanced microvascular ischemic changes.  GI team is advised is okay to resume antiplatelet therapy.    PT Comments    Pt was participative and glad to be OOB.  Emphasis today on transitions to EOB, sit to standing, gait training which was limited by loose stools and new plan of standing for peri care.   Follow Up Recommendations  SNF;Supervision/Assistance - 24 hour     Equipment Recommendations  Other (comment)(TBA after next venue)    Recommendations for Other Services       Precautions / Restrictions Precautions Precautions: Fall    Mobility  Bed Mobility Overal bed mobility: Needs Assistance Bed Mobility: Supine to Sit   Sidelying to sit: Mod assist       General bed mobility comments: cues for bridging to EOB and assist to come up and forward..  Pt able to square up on EOB without assist  Transfers Overall transfer level: Needs assistance Equipment used: Straight cane Transfers: Sit to/from Stand Sit to Stand: Mod assist         General transfer comment: Initially used the [facility], but this did not provide enough stability  Ambulation/Gait Ambulation/Gait assistance: Mod assist Gait Distance (Feet): 25 Feet(then 6 feet to a recline brought to the bathroom) Assistive device: Rolling walker (2 wheeled) Gait Pattern/deviations: Step-to  pattern;Decreased step length - left;Decreased step length - right;Decreased stance time - left;Decreased stride length Gait velocity: decreased Gait velocity interpretation: <1.31 ft/sec, indicative of household ambulator General Gait Details: paretic gait on the Left.  Cane did not provide enough stability for pt to accept w/shift assist R.  With the RW pt was able to work on w/shift and stepping forward with the L LE given assist.  Pt's pattern declined with fatigue and when pt's focus was on quickly getting to the bathroom.   Stairs             Wheelchair Mobility    Modified Rankin (Stroke Patients Only) Modified Rankin (Stroke Patients Only) Pre-Morbid Rankin Score: Moderate disability Modified Rankin: Moderately severe disability     Balance Overall balance assessment: Needs assistance Sitting-balance support: Feet supported Sitting balance-Leahy Scale: Fair Sitting balance - Comments: pt really needs some set up assist, before he is in a stable position to sit without right UE assist   Standing balance support: Bilateral upper extremity supported Standing balance-Leahy Scale: Poor Standing balance comment: Pt needs therapist assist for standing balance and with functional mobility.                             Cognition Arousal/Alertness: Awake/alert Behavior During Therapy: WFL for tasks assessed/performed Overall Cognitive Status: Impaired/Different from baseline Area of Impairment: Orientation;Awareness                 Orientation Level: Place;Time;Situation     Following Commands: Follows one step commands consistently Safety/Judgement: Decreased awareness of  deficits Awareness: Intellectual Problem Solving: Slow processing        Exercises      General Comments        Pertinent Vitals/Pain Pain Assessment: No/denies pain    Home Living                      Prior Function            PT Goals (current goals can  now be found in the care plan section) Acute Rehab PT Goals Patient Stated Goal: none stated PT Goal Formulation: With patient Time For Goal Achievement: 10/24/18 Potential to Achieve Goals: Good Progress towards PT goals: Progressing toward goals    Frequency    Min 3X/week      PT Plan Current plan remains appropriate    Co-evaluation              AM-PAC PT "6 Clicks" Mobility   Outcome Measure  Help needed turning from your back to your side while in a flat bed without using bedrails?: A Lot Help needed moving from lying on your back to sitting on the side of a flat bed without using bedrails?: A Lot Help needed moving to and from a bed to a chair (including a wheelchair)?: A Lot Help needed standing up from a chair using your arms (e.g., wheelchair or bedside chair)?: A Lot Help needed to walk in hospital room?: A Lot Help needed climbing 3-5 steps with a railing? : Total 6 Click Score: 11    End of Session     Patient left: with call bell/phone within reach;in chair;with chair alarm set Nurse Communication: Mobility status;Other (comment) PT Visit Diagnosis: Hemiplegia and hemiparesis;Other abnormalities of gait and mobility (R26.89) Hemiplegia - Right/Left: Left Hemiplegia - dominant/non-dominant: Non-dominant Hemiplegia - caused by: Cerebral infarction     Time: 3664-4034 PT Time Calculation (min) (ACUTE ONLY): 41 min  Charges:  $Gait Training: 8-22 mins $Therapeutic Activity: 23-37 mins                     10/20/2018  [place] Bing, PT Acute Rehabilitation Services [phone]  (pager) [phone]  (office)   [name] Zuri [place] 10/20/2018, 4:54 PM

## 2018-10-20 NOTE — Progress Notes (Signed)
  Speech Language Pathology Treatment: Dysphagia  Patient Details Name: Jacob Bass MRN: [id number] DOB: 1960-10-20 Today's Date: 10/20/2018 Time: 9485-4627 SLP Time Calculation (min) (ACUTE ONLY): 17 min  Assessment / Plan / Recommendation Clinical Impression  Pt presents today with much improved clarity of speech s/p CVA.  Intelligibility has improved from 70 to 90%. Assisted pt with tray-set up for lunch.  Requires help with self-feeding given weakness LUE.  Requires intermittent verbal cues to attend to left side of mouth given reduced sensation and tendency to pocket solids in left lateral sulcus.  Pt denies difficulty.  He demonstrated intermittent coughing during meal today - lung sounds are diminished in bases; he has not had a CXR; he is afebrile.  MBS last week showed no penetration nor aspiration; deficits related to a primary oral dysphagia.  Cued pt to eat slowly, consume small bites, and attend to left side of mouth.  Will follow for compensatory strategy teaching and safety.  Pt verbalizes understanding.    HPI HPI: 58 y.o. male with past medical history significant for hypertension, tobacco abuse, and homelessness; who presented 2/8 after slipping and falling in the rain.  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   On 2/9. pt presented with neuro changes with left sided weakness and dysarthria due to actue right ext capsule/basal ganglia/corona radiata CVA. EGD/colonscopy pending, but on hold due to changes in MS. Cortrak placed in IR 2/11. MRI 2/9: 13 mm acute/early subacute infarction within right external capsule and corona radiata. Several additional punctate acute/early subacute infarcts are present in bilateral basal ganglia. 3. 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 Advanced chronic microvascular ischemic changes and moderate volume loss of the brain.      SLP Plan  Continue with current plan of care       Recommendations   Diet recommendations: Dysphagia 1 (puree);Thin liquid Liquids provided via: Cup Medication Administration: Crushed with puree Supervision: Patient able to self feed;Full supervision/cueing for compensatory strategies Compensations: Minimize environmental distractions;Lingual sweep for clearance of pocketing                Oral Care Recommendations: Oral care BID Follow up Recommendations: Skilled Nursing facility SLP Visit Diagnosis: Dysphagia, oral phase (R13.11) Plan: Continue with current plan of care       GO              Heide Brossart L. Samson Frederic, MA CCC/SLP Acute Rehabilitation Services Office number [phone] Pager [phone]   [name] 10/20/2018, 12:22 PM

## 2018-10-21 LAB — GLUCOSE, CAPILLARY
Glucose-Capillary: 92 mg/dL (ref 70–99)
Glucose-Capillary: 97 mg/dL (ref 70–99)
Glucose-Capillary: 110 mg/dL — ABNORMAL HIGH (ref 70–99)

## 2018-10-21 NOTE — Progress Notes (Signed)
[facility] PROGRESS NOTE    Progress Note  Jacob Bass  NUU:[id number] DOB: 10-12-1960 DOA: 10/09/2018 PCP: Patient, No Pcp Per     Brief Narrative:   Jacob Bass is an 58 y.o. male past medical history significant for hypertension, heart failure tobacco abuse and homeless presents to the hospital after slipping and falling in the rain.  To the hospital his troponins were minimally elevated, cardiology is cleared the patient due to his anemia GI was consulted who proceeded with an EGD and colonoscopy.  The patient mental status continued to deteriorate and was noted to have a left upper extremity weakness with a facial droop and MRI of the brain was done that revealed 13 mm acute infarct on the right external capsule and corona radiata, with several additional punctuated acute early subacute infarcts present bilaterally in basal ganglia and right anterior basal ganglia showed chronic hemorrhagic infarction and advanced microvascular ischemic changes.  GI team is advised is okay to resume antiplatelet therapy.  Assessment/Plan:   Acute encephalopathy likely due to acute external capsule and basal ganglia infarct: He will continue plan as an outpatient. Patient is stable no changes awaiting skilled nursing facility placement.  Metabolic encephalopathy: Likely multifactorial in the setting of volume depletion stroke alcohol liver cirrhosis and recurrent hypoglycemia.   Patient is back to baseline.  Episodes hypoglycemia: Likely due to decreased oral intake. Currently n.p.o.  Macrocytic anemia: Likely multifactorial in the setting of alcohol possible B12 deficiency and he had an EGD that showed a nonbleeding clean base ulcer. Due to financial reasons and compliance and that the patient will be hard to follow-up he will be sent home on aspirin and Protonix twice a day.  Elevated troponins: Hepatitis C antibody positive in blood/  Cirrhosis of liver without ascites (HCC) Alcohol  abuse: Status post fall: Essential hypertension: Chronic thrombocytopenia: Tobacco abuse: Choledocholithiasis: New mild hypernatremia:  DVT prophylaxis:Heparin Family Communication:none Disposition Plan/Barrier to D/C: AWaiting skilled nursing facility placement. Code Status:     Code Status Orders  (From admission, onward)         Start     Ordered   10/09/18 2345  Full code  Continuous     10/09/18 2344        Code Status History    This patient has a current code status but no historical code status.        IV Access:    Peripheral IV   Procedures and diagnostic studies:   No results found.   Medical Consultants:    None.  Anti-Infectives:   None  Subjective:    Jacob Bass no complaints.  Objective:    Vitals:   10/20/18 1556 10/20/18 1934 10/20/18 2352 10/21/18 0737  BP: (!) 173/77 (!) 149/73 (!) 178/84 (!) 164/77  Pulse: (!) 56 63 (!) 59 66  Resp: 19 15 17 16   Temp: 98 F (36.7 C) 99.1 F (37.3 C) 98 F (36.7 C) 98.5 F (36.9 C)  TempSrc: Oral Oral Oral Oral  SpO2: 100% 99% 98% 99%  Weight:      Height:        Intake/Output Summary (Last 24 hours) at 10/21/2018 0853 Last data filed at 10/20/2018 1200 Gross per 24 hour  Intake 240 ml  Output -  Net 240 ml   Filed Weights   10/18/18 0500 10/19/18 0500 10/19/18 0839  Weight: 68.5 kg 69.9 kg 69.9 kg    Exam: General exam: In no acute distress. Respiratory system: Good air movement  and clear to auscultation. Cardiovascular system: Regular rate and rhythm with positive S1-S2 Gastrointestinal system: Abdomen is nondistended, soft and nontender.  Extremities: No pedal edema. Skin: No rashes, lesions or ulcers    Data Reviewed:    Labs: Basic Metabolic Panel: Recent Labs  Lab 10/15/18 0538 10/16/18 0548 10/17/18 0613 10/18/18 0645 10/19/18 0727  NA 145 146* 143 138 137  K 3.4* 3.5 3.4* 4.0 3.6  CL 118* 118* 118* 111 108  CO2 22 18* 22 22 22   GLUCOSE 119* 133*  99 79 74  BUN 6 6 8 10 10   CREATININE 0.93 0.87 0.85 0.74 0.77  CALCIUM 7.4* 7.7* 7.7* 7.6* 7.8*   GFR Estimated Creatinine Clearance: 87.3 mL/min (by C-G formula based on SCr of 0.77 mg/dL). Liver Function Tests: No results for input(s): AST, ALT, ALKPHOS, BILITOT, PROT, ALBUMIN in the last 168 hours. No results for input(s): LIPASE, AMYLASE in the last 168 hours. Recent Labs  Lab 10/14/18 1222  AMMONIA 22   Coagulation profile No results for input(s): INR, PROTIME in the last 168 hours.  CBC: Recent Labs  Lab 10/15/18 0538 10/16/18 0548  WBC 6.3 8.7  HGB 8.3* 9.0*  HCT 25.6* 26.4*  MCV 102.8* 102.3*  PLT 133* 148*   Cardiac Enzymes: No results for input(s): CKTOTAL, CKMB, CKMBINDEX, TROPONINI in the last 168 hours. BNP (last 3 results) No results for input(s): PROBNP in the last 8760 hours. CBG: Recent Labs  Lab 10/19/18 2004 10/19/18 2323 10/20/18 0704 10/20/18 1134 10/20/18 1721  GLUCAP 70 82 88 89 76   D-Dimer: No results for input(s): DDIMER in the last 72 hours. Hgb A1c: No results for input(s): HGBA1C in the last 72 hours. Lipid Profile: No results for input(s): CHOL, HDL, LDLCALC, TRIG, CHOLHDL, LDLDIRECT in the last 72 hours. Thyroid function studies: No results for input(s): TSH, T4TOTAL, T3FREE, THYROIDAB in the last 72 hours.  Invalid input(s): FREET3 Anemia work up: No results for input(s): VITAMINB12, FOLATE, FERRITIN, TIBC, IRON, RETICCTPCT in the last 72 hours. Sepsis Labs: Recent Labs  Lab 10/15/18 0538 10/16/18 0548  WBC 6.3 8.7   Microbiology Recent Results (from the past 240 hour(s))  Culture, blood (routine x 2)     Status: None   Collection Time: 10/13/18  7:59 PM  Result Value Ref Range Status   Specimen Description BLOOD LEFT HAND  Final   Special Requests   Final    BOTTLES DRAWN AEROBIC AND ANAEROBIC Blood Culture adequate volume   Culture   Final    NO GROWTH 5 DAYS Performed at [facility], [address] [place], [place] [address]    Report Status 10/18/2018 FINAL  Final  Culture, blood (routine x 2)     Status: None   Collection Time: 10/13/18  7:59 PM  Result Value Ref Range Status   Specimen Description BLOOD RIGHT HAND  Final   Special Requests   Final    BOTTLES DRAWN AEROBIC ONLY Blood Culture adequate volume   Culture   Final    NO GROWTH 5 DAYS Performed at [facility], [address] [place], [place] [address]    Report Status 10/18/2018 FINAL  Final  Culture, Urine     Status: Abnormal   Collection Time: 10/14/18 12:41 AM  Result Value Ref Range Status   Specimen Description URINE, CLEAN CATCH  Final   Special Requests   Final    NONE Performed at [facility], [address], [place], [place] [address]    Culture MULTIPLE SPECIES PRESENT, SUGGEST RECOLLECTION (A)  Final   Report Status 10/16/2018 FINAL  Final     Medications:   . amLODipine  2.5 mg Oral Daily  . aspirin  325 mg Per Tube Daily  . atorvastatin  40 mg Per Tube q1800  . feeding supplement (PRO-STAT SUGAR FREE 64)  30 mL Oral BID  . folic acid  1 mg Oral Daily  . heparin injection (subcutaneous)  5,000 Units Subcutaneous Q8H  . lisinopril  40 mg Oral Daily  . metoprolol tartrate  25 mg Per Tube BID  . multivitamin with minerals  1 tablet Oral Daily  . pantoprazole  40 mg Oral BID  . polyethylene glycol powder  1 Container Oral Once  . sodium chloride flush  10-40 mL Intracatheter Q12H  . thiamine  100 mg Oral Daily   Continuous Infusions: . dextrose 75 mL/hr at 10/18/18 1146     LOS: 10 days   Jacob Bass  [facility]  10/21/2018, 8:53 AM

## 2018-10-22 ENCOUNTER — Inpatient Hospital Stay (HOSPITAL_COMMUNITY): Payer: Medicaid Other

## 2018-10-22 DIAGNOSIS — W19XXXA Unspecified fall, initial encounter: Secondary | ICD-10-CM

## 2018-10-22 LAB — GLUCOSE, CAPILLARY
GLUCOSE-CAPILLARY: 101 mg/dL — AB (ref 70–99)
Glucose-Capillary: 91 mg/dL (ref 70–99)

## 2018-10-22 MED ORDER — GUAIFENESIN ER 600 MG PO TB12
600.0000 mg | ORAL_TABLET | Freq: Two times a day (BID) | ORAL | Status: DC
  Administered 2018-10-22 – 2018-10-27 (×11): 600 mg via ORAL
  Filled 2018-10-22 (×11): qty 1

## 2018-10-22 MED ORDER — IPRATROPIUM-ALBUTEROL 0.5-2.5 (3) MG/3ML IN SOLN
3.0000 mL | Freq: Three times a day (TID) | RESPIRATORY_TRACT | Status: DC
  Administered 2018-10-22 – 2018-10-23 (×3): 3 mL via RESPIRATORY_TRACT
  Filled 2018-10-22 (×3): qty 3

## 2018-10-22 MED ORDER — ENSURE ENLIVE PO LIQD
237.0000 mL | Freq: Every day | ORAL | Status: DC
  Administered 2018-10-22 – 2018-10-27 (×6): 237 mL via ORAL

## 2018-10-22 NOTE — Progress Notes (Addendum)
PROGRESS NOTE  Jacob Bass GXQ:[id number] DOB: 05-08-1961 DOA: 10/09/2018 PCP: Patient, No Pcp Per  HPI/Recap of past 24 hours:  Alert, interactive Not oriented to the month, know he is in the hospital, not able to state the name Reports some intermittent nonproductive cough, and intermittent wheezing,  no chest pain, no fever, no edema pe rRN, patient is getting all meds crushed, no overt sign of aspiration  Assessment/Plan: Principal Problem:   Anemia, macrocytic Active Problems:   Chest pain   Hepatitis C antibody positive in blood   EKG abnormalities   Homelessness   Blood pressure elevated without history of HTN   Thrombocytopenia (HCC)   Cerebral thrombosis with cerebral infarction   Cerebral embolism with cerebral infarction   Subarachnoid hemorrhage   Intracerebral hemorrhage   Heme positive stool   Cirrhosis of liver without ascites (HCC)   Gastric ulcer    Cough/wheezing: -no fever, no chest pain, h/o smoking (2pack a day) -will get cxr, start nebs, mucinex  Acute external capsule and basal ganglia infarct: He will continue plan as an outpatient. Patient is stable no changes awaiting skilled nursing facility placement.  Acute Metabolic encephalopathy: Likely multifactorial in the setting of volume depletion stroke alcohol liver cirrhosis and recurrent hypoglycemia.   Patient is back to baseline.  Episodes hypoglycemia: Likely due to decreased oral intake.  Dysphagia: Diet recommendations: Dysphagia 1 (puree);Thin liquid Liquids provided via: Cup Medication Administration: Crushed with puree Supervision: Patient able to self feed;Intermittent supervision to cue for compensatory strategies Compensations: Minimize environmental distractions;Lingual sweep for clearance of pocketing  Macrocytic anemia: Likely multifactorial in the setting of alcohol possible B12 deficiency and he had an EGD that showed a nonbleeding clean base ulcer. He declined  colonoscopy Due to financial reasons and compliance and that the patient will be hard to follow-up he will be sent home on aspirin and Protonix twice a day. GI to Follow up biopsy result   Mild troponin elevation opn presentation Troponin minimally elevated and flat at 0.04 >> 0.04 >> 0.05 >> 0.05 >> 0.04 >> 0.04. No chest pain,  Echo preserved lvef, with grade II diastolic dysfunction Cardiology consulted, recommend out patient follow up for further cardiovascular risk stratification .  Alcohol abuse/Hepatitis C /  Cirrhosis of liver without ascites (HCC), needs infectious disease follow up HIV negative   Status post fall: fall precaution /PT eval  Homeless: Child psychotherapist consulted  Code Status: full  Family Communication: patient   Disposition Plan: will need snf, difficult placement due to homeless issues   Consultants:  Neurology  GI  cardiology  Procedures:  EGD on 2/16  Antibiotics:  none   Objective: BP 134/65 (BP Location: Left Arm)   Pulse (!) 57   Temp (!) 97.3 F (36.3 C) (Oral)   Resp 20   Ht 5\' 3"  (1.6 m)   Wt 69.9 kg   SpO2 98%   BMI 27.28 kg/m  No intake or output data in the 24 hours ending 10/22/18 1357 Filed Weights   10/18/18 0500 10/19/18 0500 10/19/18 0839  Weight: 68.5 kg 69.9 kg 69.9 kg    Exam: Patient is examined daily including today on 10/22/2018, exams remain the same as of yesterday except that has changed    General:  NAD, alert, not oriented to the month  Cardiovascular: RRR  Respiratory: mild bilateral wheezing, right basilar rales  Abdomen: Soft/ND/NT, positive BS  Musculoskeletal: No Edema  Neuro: alert, oriented   Data Reviewed: Basic Metabolic Panel: Recent  Labs  Lab 10/16/18 0548 10/17/18 0613 10/18/18 0645 10/19/18 0727  NA 146* 143 138 137  K 3.5 3.4* 4.0 3.6  CL 118* 118* 111 108  CO2 18* 22 22 22   GLUCOSE 133* 99 79 74  BUN 6 8 10 10   CREATININE 0.87 0.85 0.74 0.77  CALCIUM 7.7* 7.7* 7.6*  7.8*   Liver Function Tests: No results for input(s): AST, ALT, ALKPHOS, BILITOT, PROT, ALBUMIN in the last 168 hours. No results for input(s): LIPASE, AMYLASE in the last 168 hours. No results for input(s): AMMONIA in the last 168 hours. CBC: Recent Labs  Lab 10/16/18 0548  WBC 8.7  HGB 9.0*  HCT 26.4*  MCV 102.3*  PLT 148*   Cardiac Enzymes:   No results for input(s): CKTOTAL, CKMB, CKMBINDEX, TROPONINI in the last 168 hours. BNP (last 3 results) No results for input(s): BNP in the last 8760 hours.  ProBNP (last 3 results) No results for input(s): PROBNP in the last 8760 hours.  CBG: Recent Labs  Lab 10/20/18 1134 10/20/18 1721 10/21/18 1119 10/21/18 1734 10/21/18 2136  GLUCAP 89 76 92 97 110*    Recent Results (from the past 240 hour(s))  Culture, blood (routine x 2)     Status: None   Collection Time: 10/13/18  7:59 PM  Result Value Ref Range Status   Specimen Description BLOOD LEFT HAND  Final   Special Requests   Final    BOTTLES DRAWN AEROBIC AND ANAEROBIC Blood Culture adequate volume   Culture   Final    NO GROWTH 5 DAYS Performed at [facility], [address] [place], [place] [address]    Report Status 10/18/2018 FINAL  Final  Culture, blood (routine x 2)     Status: None   Collection Time: 10/13/18  7:59 PM  Result Value Ref Range Status   Specimen Description BLOOD RIGHT HAND  Final   Special Requests   Final    BOTTLES DRAWN AEROBIC ONLY Blood Culture adequate volume   Culture   Final    NO GROWTH 5 DAYS Performed at [facility], [address] [place], [place] [address]    Report Status 10/18/2018 FINAL  Final  Culture, Urine     Status: Abnormal   Collection Time: 10/14/18 12:41 AM  Result Value Ref Range Status   Specimen Description URINE, CLEAN CATCH  Final   Special Requests   Final    NONE Performed at [facility], [address] [place], [place] [address]    Culture MULTIPLE SPECIES PRESENT, SUGGEST  RECOLLECTION (A)  Final   Report Status 10/16/2018 FINAL  Final     Studies: No results found.  Scheduled Meds: . amLODipine  2.5 mg Oral Daily  . aspirin  325 mg Per Tube Daily  . atorvastatin  40 mg Per Tube q1800  . feeding supplement (PRO-STAT SUGAR FREE 64)  30 mL Oral BID  . folic acid  1 mg Oral Daily  . heparin injection (subcutaneous)  5,000 Units Subcutaneous Q8H  . lisinopril  40 mg Oral Daily  . metoprolol tartrate  25 mg Per Tube BID  . multivitamin with minerals  1 tablet Oral Daily  . pantoprazole  40 mg Oral BID  . polyethylene glycol powder  1 Container Oral Once  . thiamine  100 mg Oral Daily    Continuous Infusions: . dextrose 75 mL/hr at 10/18/18 1146     Time spent: <MEASUREMENTDClarene Marland KitcheShirlee LatchkeVirtua West Jersey HospitalLevonne MarSimon DDClarene Marland KitcheShirlee LatchkeBuena Vista Regional MediLevoSimon DDClarene Marland KitcheShirlee LatchkeSaint Anne'LevonneSimon DDClarene Marland KitcheShirlee LatchkeRiver North Same Day SLevonSimon<BADDelmaResurrection MeSimon DDClarene Marland KitcheShirlee LatchkeWilson MediLevonneSimon DDClarene Marland KitcheShirlee LatchkeSurgery Center Of KeLevonSimon DDClarene Marland KitcheShirlee LatchkeDepartment Of Veterans Affairs MediLevonne LSimon<MEASUREMENClarene Marland KitcheShirlee LatcLevonnSimon DDClarene Marland KitcheShirlee LatchkeSurgery Centre Of Sw FLevonne MaSimon DDClarene Marland KitcheShirlee LatchkePemiscot County HeaLevonnSimon DDClarene Marland KitcheShirlee LatchkeVision Surgery LevonSimon DDClarene Marland KitcheShirlee LatchkeArkansas Gastroenterology EndoscLevonnSimon DDClarene Marland KitcheShirlee LatchkeGiLevonnSimon DDClarene Marland KitcheShirlee LatchkeBaylor Scott & White Medical Center At LevonSimon DDClarene Marland KitcheShirlee LatchkeHospital District 1 Of RLevSimon DDClarene Marland KitcheShirlee LatchkeMethodist Mansfield MediLevonne CoSimon DDClarene Marland KitcheShirlee LatchkeUnion Correctional InstitutLevonSimon  personally reviewed and interpreted on  10/22/2018 daily labs, tele strips, imagings as discussed above under date review session and assessment and plans.  I reviewed all nursing notes, pharmacy notes, consultant notes,  vitals, pertinent old records  I have discussed plan of care as described above with RN , patient  on 10/22/2018   [name] MD, PhD  [facility] Pager [phone]. If 7PM-7AM, please contact night-coverage at [email or web address], password [facility] 10/22/2018, 1:57 PM  LOS: 11 days

## 2018-10-22 NOTE — Progress Notes (Signed)
Occupational Therapy Treatment Patient Details Name: Jacob [place] MRN: [id number] DOB: 02/13/1961 Today's Date: 10/22/2018    History of present illness Jacob Bass is an 58 y.o. male past medical history significant for hypertension, heart failure tobacco abuse and homeless presented with elevated troponins and question GI blood loss developed AMS and L side weakness after GI testing,  MRI with 13 mm acute infarct on the right external capsule and corona radiata, with several additional punctuated acute early subacute infarcts present bilaterally in basal ganglia and right anterior basal ganglia showed chronic hemorrhagic infarction and advanced microvascular ischemic changes.  GI team is advised is okay to resume antiplatelet therapy.   OT comments  Session focused on ADL transfers, LB bathing/dressing, and L UE NMR. Pt had episode of urine incontinence upon standing EOB. Pt still requiring mod A for transfers. L UE towel slides in gravity eliminated plane completed and pt instructed in self PROM exercises- anticipate poor carryover. SNF remains appropriate d/c.    Follow Up Recommendations  SNF;Supervision/Assistance - 24 hour    Equipment Recommendations  None recommended by OT       Precautions / Restrictions Precautions Precautions: Fall Restrictions Weight Bearing Restrictions: No       Mobility Bed Mobility Overal bed mobility: Needs Assistance Bed Mobility: Supine to Sit Rolling: Min assist         General bed mobility comments: Cues for UE placement and manual facilitation for L UE  Transfers Overall transfer level: Needs assistance Equipment used: Rolling walker (2 wheeled) Transfers: Sit to/from Stand Sit to Stand: Mod assist              Balance Overall balance assessment: Needs assistance Sitting-balance support: Feet supported Sitting balance-Leahy Scale: Fair     Standing balance support: Bilateral upper extremity supported Standing balance-Leahy  Scale: Poor Standing balance comment: Pt needs therapist assist for standing balance and with functional mobility.                            ADL either performed or assessed with clinical judgement   ADL Overall ADL's : Needs assistance/impaired             Lower Body Bathing: Min guard;Sitting/lateral leans Lower Body Bathing Details (indicate cue type and reason): Peri hygiene completed following urine incontinence                     Functional mobility during ADLs: Moderate assistance General ADL Comments: Pt completed sit > stand with mod A from EOB, pt attempted to begin walking, took 2 steps and then was incontinent of urine. Pt returned to bed and required max A to don socks               Cognition Arousal/Alertness: Awake/alert Behavior During Therapy: WFL for tasks assessed/performed Overall Cognitive Status: Impaired/Different from baseline Area of Impairment: Orientation;Awareness                 Orientation Level: Place;Time;Situation     Following Commands: Follows one step commands consistently Safety/Judgement: Decreased awareness of deficits Awareness: Intellectual Problem Solving: Slow processing General Comments: Pt requiring moderate cueing for following instructions and general awareness        Exercises Exercises: General Upper Extremity General Exercises - Upper Extremity Shoulder Flexion: Left;10 reps;AAROM Shoulder Extension: 10 reps;Seated;AAROM Elbow Flexion: AAROM;10 reps;Seated(gravity eliminated) Elbow Extension: AAROM;Seated;10 reps(gravity eliminated)  Pertinent Vitals/ Pain       Pain Assessment: No/denies pain         Frequency  Min 2X/week        Progress Toward Goals  OT Goals(current goals can now be found in the care plan section)  Progress towards OT goals: Progressing toward goals  Acute Rehab OT Goals Patient Stated Goal: none stated OT Goal Formulation: Patient unable  to participate in goal setting Time For Goal Achievement: 10/29/18 Potential to Achieve Goals: Good  Plan Discharge plan remains appropriate       AM-PAC OT "6 Clicks" Daily Activity     Outcome Measure   Help from another person eating meals?: A Little Help from another person taking care of personal grooming?: A Little Help from another person toileting, which includes using toliet, bedpan, or urinal?: A Lot Help from another person bathing (including washing, rinsing, drying)?: A Lot Help from another person to put on and taking off regular upper body clothing?: A Lot Help from another person to put on and taking off regular lower body clothing?: A Lot 6 Click Score: 14    End of Session Equipment Utilized During Treatment: Rolling walker  OT Visit Diagnosis: Unsteadiness on feet (R26.81);Muscle weakness (generalized) (M62.81);Hemiplegia and hemiparesis   Activity Tolerance Patient tolerated treatment well   Patient Left in bed;with call bell/phone within reach;with bed alarm set   Nurse Communication Mobility status;Other (comment)(incontinence)        Time: 0511-0211 OT Time Calculation (min): 25 min  Charges: OT General Charges $OT Visit: 1 Visit OT Treatments $Self Care/Home Management : 23-37 mins   Crissie Reese OTR/L  10/22/2018, 12:20 PM

## 2018-10-22 NOTE — Progress Notes (Signed)
Physical Therapy Treatment Patient Details Name: Jacob Bass MRN: [id number] DOB: 08/12/1961 Today's Date: 10/22/2018    History of Present Illness Jacob Bass is an 58 y.o. male past medical history significant for hypertension, heart failure tobacco abuse and homeless presented with elevated troponins and question GI blood loss developed AMS and L side weakness after GI testing,  MRI with 13 mm acute infarct on the right external capsule and corona radiata, with several additional punctuated acute early subacute infarcts present bilaterally in basal ganglia and right anterior basal ganglia showed chronic hemorrhagic infarction and advanced microvascular ischemic changes.  GI team is advised is okay to resume antiplatelet therapy.    PT Comments    Treatment limited due to transport arrived for CT.  Emphasis on transitions, sitting balance and scooting to EOB, sit to stand and gait training to the transport chair.    Follow Up Recommendations  SNF;Supervision/Assistance - 24 hour     Equipment Recommendations  (TBA)    Recommendations for Other Services       Precautions / Restrictions Precautions Precautions: Fall Restrictions Weight Bearing Restrictions: No    Mobility  Bed Mobility Overal bed mobility: Needs Assistance Bed Mobility: Supine to Sit Rolling: Min assist         General bed mobility comments: Cues for UE placement and manual facilitation for L UE.  pt pivoted with/around right UE  Transfers   Equipment used: Rolling walker (2 wheeled) Transfers: Sit to/from Stand Sit to Stand: Mod assist            Ambulation/Gait Ambulation/Gait assistance: Mod assist Gait Distance (Feet): 22 Feet Assistive device: Rolling walker (2 wheeled) Gait Pattern/deviations: Step-to pattern;Decreased step length - left;Decreased step length - right;Decreased stance time - left;Decreased stride length Gait velocity: decreased   General Gait Details: paretic gait on  the Left with assist needed for w/shift and advancing the left LE once unweaighted.   Stairs             Wheelchair Mobility    Modified Rankin (Stroke Patients Only) Modified Rankin (Stroke Patients Only) Modified Rankin: Moderately severe disability     Balance Overall balance assessment: Needs assistance Sitting-balance support: Feet supported Sitting balance-Leahy Scale: Fair     Standing balance support: Bilateral upper extremity supported Standing balance-Leahy Scale: Poor Standing balance comment: Pt needs therapist assist for standing balance and with functional mobility.                             Cognition Arousal/Alertness: Awake/alert Behavior During Therapy: WFL for tasks assessed/performed Overall Cognitive Status: Impaired/Different from baseline Area of Impairment: Orientation;Awareness                 Orientation Level: Place;Time;Situation     Following Commands: Follows one step commands consistently Safety/Judgement: Decreased awareness of deficits Awareness: Intellectual Problem Solving: Slow processing General Comments: Pt requiring moderate cueing for following instructions and general awareness      Exercises General Exercises - Upper Extremity Elbow Extension: (gravity eliminated)    General Comments        Pertinent Vitals/Pain Pain Assessment: No/denies pain    Home Living                      Prior Function            PT Goals (current goals can now be found in the care plan section) Acute Rehab PT  Goals Patient Stated Goal: none stated PT Goal Formulation: With patient Time For Goal Achievement: 10/24/18 Potential to Achieve Goals: Good Progress towards PT goals: Progressing toward goals    Frequency    Min 3X/week      PT Plan Current plan remains appropriate    Co-evaluation              AM-PAC PT "6 Clicks" Mobility   Outcome Measure  Help needed turning from your  back to your side while in a flat bed without using bedrails?: A Lot Help needed moving from lying on your back to sitting on the side of a flat bed without using bedrails?: A Lot Help needed moving to and from a bed to a chair (including a wheelchair)?: A Lot Help needed standing up from a chair using your arms (e.g., wheelchair or bedside chair)?: A Lot Help needed to walk in hospital room?: A Lot Help needed climbing 3-5 steps with a railing? : Total 6 Click Score: 11    End of Session   Activity Tolerance: Patient limited by pain Patient left: with call bell/phone within reach;in chair;with chair alarm set Nurse Communication: Mobility status;Other (comment) PT Visit Diagnosis: Hemiplegia and hemiparesis;Other abnormalities of gait and mobility (R26.89) Hemiplegia - Right/Left: Left Hemiplegia - dominant/non-dominant: Non-dominant Hemiplegia - caused by: Cerebral infarction     Time: 4239-5320 PT Time Calculation (min) (ACUTE ONLY): 14 min  Charges:  $Gait Training: 8-22 mins                     10/22/2018  [place] Bing, PT Acute Rehabilitation Services [phone]  (pager) [phone]  (office)   [name] Kendalyn Cranfield 10/22/2018, 4:05 PM

## 2018-10-22 NOTE — Progress Notes (Signed)
Nutrition Follow-up  DOCUMENTATION CODES:   Not applicable  INTERVENTION:  Continue 30 ml Prostat po BID, each supplement provides 100 kcal and 15 grams of protein.   Provide Ensure Enlive po once daily, each supplement provides 350 kcal and 20 grams of protein.  Encourage adequate PO intake.   NUTRITION DIAGNOSIS:   Inadequate oral intake related to inability to eat, dysphagia as evidenced by NPO status; diet advanced; improving  GOAL:   Patient will meet greater than or equal to 90% of their needs; progressing  MONITOR:   PO intake, Supplement acceptance, Diet advancement, Weight trends, Labs, Skin, I & O's  REASON FOR ASSESSMENT:   Consult Enteral/tube feeding initiation and management  ASSESSMENT:   58 y.o. male with past medical history significant for hypertension, CHF, tobacco abuse and homelessness. Presents after fall.  MRI brain revealed 13 mm acute/early subacute infarction within right external capsule and corona radiata, several additional punctate acute/early subacute infarcts present bilateral basal ganglia, right anterior basal ganglia chronic hemorrhagic infarction and advanced chronic microvascular ischemic changes and moderate volume loss of the brain.   EGD revealed non-bleeding clear base ulcer.   Pt is currently on a dysphagia 1 diet with thin liquids. Meal completion has been 50-95%. Pt currently has Prostat ordered and has been consuming them. RD to additionally order Ensure to aid in caloric and protein needs. Pt encouraged to eat his food at meals and to consume his supplements. Labs and medications reviewed.   Diet Order:   Diet Order            Diet - low sodium heart healthy        DIET - DYS 1 Room service appropriate? Yes; Fluid consistency: Thin  Diet effective now              EDUCATION NEEDS:   Not appropriate for education at this time  Skin:  Skin Assessment: Reviewed RN Assessment  Last BM:  2/17  Height:   Ht Readings  from Last 1 Encounters:  10/19/18 5\' 3"  (1.6 m)    Weight:   Wt Readings from Last 1 Encounters:  10/19/18 69.9 kg    Ideal Body Weight:  56.36 kg  BMI:  Body mass index is 27.28 kg/m.  Estimated Nutritional Needs:   Kcal:  1700-1900  Protein:  80-95 grams  Fluid:  1.7 - 1.9 L/day    Roslyn Smiling, MS, RD, LDN Pager # [phone] After hours/ weekend pager # [phone]

## 2018-10-22 NOTE — Progress Notes (Signed)
  Speech Language Pathology Treatment: Dysphagia  Patient Details Name: Jacob Bass MRN: [id number] DOB: May 21, 1961 Today's Date: 10/22/2018 Time: 1010-1030 SLP Time Calculation (min) (ACUTE ONLY): 20 min  Assessment / Plan / Recommendation Clinical Impression  Pt with ongoing improvements in speech intelligibility; continues with anosagnosia, contending that he walked out to the parking lot earlier today and denying any real deficits post-stroke.  Reviewed precautions while eating.  Pt continues to demonstrate some intermittent coughing with PO intake.  Cued to attend to left oral cavity, perform lingual sweep, and take small sips.  Able to carry-through precautions after initial review.  Lung sounds are clear per chart review and confirmation per RN, who states pt has been coughing intermittently throughout the day.  SLP will follow.     HPI HPI: 58 y.o. male with past medical history significant for hypertension, tobacco abuse, and homelessness; who presented 2/8 after slipping and falling in the rain.  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   On 2/9. pt presented with neuro changes with left sided weakness and dysarthria due to actue right ext capsule/basal ganglia/corona radiata CVA. EGD/colonscopy pending, but on hold due to changes in MS. Cortrak placed in IR 2/11. MRI 2/9: 13 mm acute/early subacute infarction within right external capsule and corona radiata. Several additional punctate acute/early subacute infarcts are present in bilateral basal ganglia. 3. 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 Advanced chronic microvascular ischemic changes and moderate volume loss of the brain.      SLP Plan  Continue with current plan of care       Recommendations  Diet recommendations: Dysphagia 1 (puree);Thin liquid Liquids provided via: Cup Medication Administration: Crushed with puree Supervision: Patient able to self feed;Intermittent  supervision to cue for compensatory strategies Compensations: Minimize environmental distractions;Lingual sweep for clearance of pocketing                Oral Care Recommendations: Oral care BID Follow up Recommendations: Skilled Nursing facility SLP Visit Diagnosis: Dysphagia, oral phase (R13.11) Plan: Continue with current plan of care       GO              Novalynn Branaman L. Samson Frederic, MA CCC/SLP Acute Rehabilitation Services Office number [phone] Pager [phone]   [name] 10/22/2018, 10:41 AM

## 2018-10-23 DIAGNOSIS — R1310 Dysphagia, unspecified: Secondary | ICD-10-CM

## 2018-10-23 LAB — GLUCOSE, CAPILLARY
GLUCOSE-CAPILLARY: 102 mg/dL — AB (ref 70–99)
GLUCOSE-CAPILLARY: 85 mg/dL (ref 70–99)
Glucose-Capillary: 74 mg/dL (ref 70–99)
Glucose-Capillary: 93 mg/dL (ref 70–99)
Glucose-Capillary: 97 mg/dL (ref 70–99)

## 2018-10-23 MED ORDER — ASPIRIN 325 MG PO TABS
325.0000 mg | ORAL_TABLET | Freq: Every day | ORAL | Status: DC
  Administered 2018-10-24 – 2018-10-26 (×3): 325 mg via ORAL
  Filled 2018-10-23 (×6): qty 1

## 2018-10-23 MED ORDER — IPRATROPIUM-ALBUTEROL 0.5-2.5 (3) MG/3ML IN SOLN: 3.0000 mL | RESPIRATORY_TRACT | Status: DC | PRN

## 2018-10-23 MED ORDER — NYSTATIN 100000 UNIT/ML MT SUSP
5.0000 mL | Freq: Four times a day (QID) | OROMUCOSAL | Status: DC
  Administered 2018-10-23 – 2018-10-27 (×16): 500000 [IU] via ORAL
  Filled 2018-10-23 (×15): qty 5

## 2018-10-23 NOTE — Progress Notes (Signed)
CM spoke with patient's friend Jacob Bass on the phone about pt staying with his after rehab. Per Jacob Bass he doesn't have room for the patient. Jacob Bass is going to ask some of their other friends if Jacob Bass will be able to stay with them and let CM know. CSW updated.

## 2018-10-23 NOTE — Progress Notes (Signed)
PROGRESS NOTE  Jacob Bass LJQ:[id number] DOB: July 11, 1961 DOA: 10/09/2018 PCP: Patient, No Pcp Per  HPI/Recap of past 24 hours:  Alert, interactive Still a littler confused, today he is able to tell me the year and month, but now sure if he is in the hospital or at home Reports breathing treatment helped, feeling better   no chest pain, no fever, no edema pe rRN, patient is getting all meds crushed, no overt sign of aspiration on dysphagia I diet  Assessment/Plan: Principal Problem:   Anemia, macrocytic Active Problems:   Chest pain   Hepatitis C antibody positive in blood   EKG abnormalities   Homelessness   Blood pressure elevated without history of HTN   Thrombocytopenia (HCC)   Cerebral thrombosis with cerebral infarction   Cerebral embolism with cerebral infarction   Subarachnoid hemorrhage   Intracerebral hemorrhage   Heme positive stool   Cirrhosis of liver without ascites (HCC)   Gastric ulcer   Fall    Cough/wheezing/likely copd/bronchitis: -no fever, no chest pain, no hypoxia, h/o smoking (2pack a day) -cxr with mild pleural effusion,  -improved on nebs, mucinex, will continue  Stroke:  Multifocal small infarcts including right external capsule, right CR, left cerebellar -, per neurology suspect acute CVA due to "intracranial stenosis in the setting of hypotension and hypoglycemia." -acute cva result in  left facial droop and left ue weakness  - he is on dysphagia 1 diet, meds crushed,  - awaiting skilled nursing facility placement.  Acute Metabolic encephalopathy: Likely multifactorial in the setting of volume depletion stroke alcohol liver cirrhosis and recurrent hypoglycemia.   Patient is back to baseline.  Episodes hypoglycemia: Likely due to decreased oral intake.  Dysphagia: Diet recommendations: Dysphagia 1 (puree);Thin liquid Liquids provided via: Cup Medication Administration: Crushed with puree Supervision: Patient able to self  feed;Intermittent supervision to cue for compensatory strategies Compensations: Minimize environmental distractions;Lingual sweep for clearance of pocketing  Macrocytic anemia: Likely multifactorial in the setting of alcohol possible B12 deficiency and he had an EGD that showed a nonbleeding clean base ulcer. He declined colonoscopy Due to financial reasons and compliance and that the patient will be hard to follow-up he will be sent home on aspirin and Protonix twice a day. GI to Follow up biopsy result   Mild troponin elevation opn presentation Troponin minimally elevated and flat at 0.04 >> 0.04 >> 0.05 >> 0.05 >> 0.04 >> 0.04. No chest pain,  Echo preserved lvef, with grade II diastolic dysfunction Cardiology consulted, recommend out patient follow up for further cardiovascular risk stratification .  Alcohol abuse/Hepatitis C /  Cirrhosis of liver without ascites (HCC)/thrombocytopenia, needs GI/ infectious disease follow up HIV negative   Status post fall: fall precaution /PT eval  Homeless: Child psychotherapist consulted  Oral thrush: topical nystatin  Code Status: full  Family Communication: patient   Disposition Plan: will need snf, difficult placement due to homeless issues   Consultants:  Neurology  GI  cardiology  Procedures:  EGD on 2/16  Antibiotics:  none   Objective: BP (!) 151/76 (BP Location: Right Arm)   Pulse (!) 57   Temp 98.7 F (37.1 C) (Oral)   Resp 18   Ht 5\' 3"  (1.6 m)   Wt 69.9 kg   SpO2 100%   BMI 27.28 kg/m  No intake or output data in the 24 hours ending 10/23/18 1443 Filed Weights   10/18/18 0500 10/19/18 0500 10/19/18 0839  Weight: 68.5 kg 69.9 kg 69.9 kg  Exam: Patient is examined daily including today on 10/23/2018, exams remain the same as of yesterday except that has changed    General:  NAD, alert, not oriented to the month, poor dentition, + oral thrush   Cardiovascular: RRR  Respiratory: mild bilateral  wheezing, right basilar rales  Abdomen: Soft/ND/NT, positive BS  Musculoskeletal: No Edema  Neuro: alert, oriented   Data Reviewed: Basic Metabolic Panel: Recent Labs  Lab 10/17/18 0613 10/18/18 0645 10/19/18 0727  NA 143 138 137  K 3.4* 4.0 3.6  CL 118* 111 108  CO2 22 22 22   GLUCOSE 99 79 74  BUN 8 10 10   CREATININE 0.85 0.74 0.77  CALCIUM 7.7* 7.6* 7.8*   Liver Function Tests: No results for input(s): AST, ALT, ALKPHOS, BILITOT, PROT, ALBUMIN in the last 168 hours. No results for input(s): LIPASE, AMYLASE in the last 168 hours. No results for input(s): AMMONIA in the last 168 hours. CBC: No results for input(s): WBC, NEUTROABS, HGB, HCT, MCV, PLT in the last 168 hours. Cardiac Enzymes:   No results for input(s): CKTOTAL, CKMB, CKMBINDEX, TROPONINI in the last 168 hours. BNP (last 3 results) No results for input(s): BNP in the last 8760 hours.  ProBNP (last 3 results) No results for input(s): PROBNP in the last 8760 hours.  CBG: Recent Labs  Lab 10/22/18 2119 10/22/18 2341 10/23/18 0447 10/23/18 0825 10/23/18 1210  GLUCAP 101* 91 74 85 93    Recent Results (from the past 240 hour(s))  Culture, blood (routine x 2)     Status: None   Collection Time: 10/13/18  7:59 PM  Result Value Ref Range Status   Specimen Description BLOOD LEFT HAND  Final   Special Requests   Final    BOTTLES DRAWN AEROBIC AND ANAEROBIC Blood Culture adequate volume   Culture   Final    NO GROWTH 5 DAYS Performed at [facility], [address] [place], [place] [address]    Report Status 10/18/2018 FINAL  Final  Culture, blood (routine x 2)     Status: None   Collection Time: 10/13/18  7:59 PM  Result Value Ref Range Status   Specimen Description BLOOD RIGHT HAND  Final   Special Requests   Final    BOTTLES DRAWN AEROBIC ONLY Blood Culture adequate volume   Culture   Final    NO GROWTH 5 DAYS Performed at [facility], [address] [place], [place] [address]      Report Status 10/18/2018 FINAL  Final  Culture, Urine     Status: Abnormal   Collection Time: 10/14/18 12:41 AM  Result Value Ref Range Status   Specimen Description URINE, CLEAN CATCH  Final   Special Requests   Final    NONE Performed at [facility], [address] [place], [place] [address]    Culture MULTIPLE SPECIES PRESENT, SUGGEST RECOLLECTION (A)  Final   Report Status 10/16/2018 FINAL  Final     Studies: Dg Chest 2 View  Result Date: 10/22/2018 CLINICAL DATA:  Dry cough for 1 week EXAM: CHEST - 2 VIEW COMPARISON:  10/13/2018 FINDINGS: Normal heart size. Clear lungs. Small bilateral pleural effusions. No pneumothorax. IMPRESSION: Small bilateral pleural effusion. Electronically Signed   By: Jolaine ClickArthur  Hoss M.D.   On: 10/22/2018 17:21    Scheduled Meds: . amLODipine  2.5 mg Oral Daily  . [START ON 10/24/2018] aspirin  325 mg Oral Daily  . atorvastatin  40 mg Per  Tube q1800  . feeding supplement (ENSURE ENLIVE)  237 mL Oral Q1500  . feeding supplement (PRO-STAT SUGAR FREE 64)  30 mL Oral BID  . folic acid  1 mg Oral Daily  . guaiFENesin  600 mg Oral BID  . heparin injection (subcutaneous)  5,000 Units Subcutaneous Q8H  . lisinopril  40 mg Oral Daily  . metoprolol tartrate  25 mg Per Tube BID  . multivitamin with minerals  1 tablet Oral Daily  . pantoprazole  40 mg Oral BID  . polyethylene glycol powder  1 Container Oral Once  . thiamine  100 mg Oral Daily    Continuous Infusions:    Time spent: I have personally reviewed and interpreted on  10/23/2018 daily labs, tele strips, imagings as discussed above under date review session and assessment and plans.  I reviewed all nursing notes, pharmacy notes, consultant notes,  vitals, pertinent old records  I have discussed plan of care as described above with RN , patient  on 10/23/2018   [name] MD, PhD  [facility] Pager [phone]. If 7PM-7AM, please contact night-coverage at [email or web address],  password [facility] 10/23/2018, 2:43 PM  LOS: 12 days

## 2018-10-23 NOTE — Plan of Care (Signed)
  Problem: Clinical Measurements: Goal: Cardiovascular complication will be avoided Outcome: Progressing   Problem: Activity: Goal: Risk for activity intolerance will decrease Outcome: Progressing   Problem: Nutrition: Goal: Adequate nutrition will be maintained Outcome: Progressing   

## 2018-10-24 LAB — COMPREHENSIVE METABOLIC PANEL
ALBUMIN: 2.1 g/dL — AB (ref 3.5–5.0)
ALT: 56 U/L — ABNORMAL HIGH (ref 0–44)
AST: 78 U/L — ABNORMAL HIGH (ref 15–41)
Alkaline Phosphatase: 81 U/L (ref 38–126)
Anion gap: 6 (ref 5–15)
BUN: 8 mg/dL (ref 6–20)
CO2: 27 mmol/L (ref 22–32)
Calcium: 8 mg/dL — ABNORMAL LOW (ref 8.9–10.3)
Chloride: 106 mmol/L (ref 98–111)
Creatinine, Ser: 0.72 mg/dL (ref 0.61–1.24)
GFR calc Af Amer: >60 mL/min (ref >60)
GFR calc non Af Amer: >60 mL/min (ref >60)
GLUCOSE: 97 mg/dL (ref 70–99)
Potassium: 3.6 mmol/L (ref 3.5–5.1)
SODIUM: 139 mmol/L (ref 135–145)
Total Bilirubin: 0.9 mg/dL (ref 0.3–1.2)
Total Protein: 6.1 g/dL — ABNORMAL LOW (ref 6.5–8.1)

## 2018-10-24 LAB — CBC WITH DIFFERENTIAL/PLATELET
ABS IMMATURE GRANULOCYTES: 0.03 10*3/uL (ref 0.00–0.07)
Basophils Absolute: 0 10*3/uL (ref 0.0–0.1)
Basophils Relative: 1 %
Eosinophils Absolute: 0.1 10*3/uL (ref 0.0–0.5)
Eosinophils Relative: 1 %
HCT: 28.2 % — ABNORMAL LOW (ref 39.0–52.0)
Hemoglobin: 9.1 g/dL — ABNORMAL LOW (ref 13.0–17.0)
Immature Granulocytes: 0 %
Lymphocytes Relative: 23 %
Lymphs Abs: 1.8 10*3/uL (ref 0.7–4.0)
MCH: 33.1 pg (ref 26.0–34.0)
MCHC: 32.3 g/dL (ref 30.0–36.0)
MCV: 102.5 fL — ABNORMAL HIGH (ref 80.0–100.0)
Monocytes Absolute: 1.1 10*3/uL — ABNORMAL HIGH (ref 0.1–1.0)
Monocytes Relative: 15 %
NRBC: 0 % (ref 0.0–0.2)
Neutro Abs: 4.7 10*3/uL (ref 1.7–7.7)
Neutrophils Relative %: 60 %
Platelets: 157 10*3/uL (ref 150–400)
RBC: 2.75 MIL/uL — AB (ref 4.22–5.81)
RDW: 14.8 % (ref 11.5–15.5)
WBC: 7.7 10*3/uL (ref 4.0–10.5)

## 2018-10-24 LAB — TSH: TSH: 6.984 u[IU]/mL — ABNORMAL HIGH (ref 0.350–4.500)

## 2018-10-24 LAB — GLUCOSE, CAPILLARY
Glucose-Capillary: 98 mg/dL (ref 70–99)
Glucose-Capillary: 95 mg/dL (ref 70–99)
Glucose-Capillary: 84 mg/dL (ref 70–99)
Glucose-Capillary: 90 mg/dL (ref 70–99)

## 2018-10-24 LAB — MAGNESIUM: Magnesium: 1.6 mg/dL — ABNORMAL LOW (ref 1.7–2.4)

## 2018-10-24 MED ORDER — POTASSIUM CHLORIDE CRYS ER 20 MEQ PO TBCR
40.0000 meq | EXTENDED_RELEASE_TABLET | Freq: Once | ORAL | Status: AC
  Administered 2018-10-24: 40 meq via ORAL
  Filled 2018-10-24: qty 2

## 2018-10-24 MED ORDER — FUROSEMIDE 10 MG/ML IJ SOLN
40.0000 mg | Freq: Once | INTRAMUSCULAR | Status: AC
  Administered 2018-10-24: 40 mg via INTRAVENOUS
  Filled 2018-10-24: qty 4

## 2018-10-24 MED ORDER — MAGNESIUM SULFATE 2 GM/50ML IV SOLN
2.0000 g | Freq: Once | INTRAVENOUS | Status: AC
  Administered 2018-10-24: 2 g via INTRAVENOUS
  Filled 2018-10-24: qty 50

## 2018-10-24 NOTE — Progress Notes (Signed)
CSW called and spoke with [name], friend of the patient and emergency contact. He stated to contact his landlord and ask if the patient is able to stay with his friend on her property. CSW left a message for the landlord. Her name is Elayne Guerin [phone]). Per RNCM she called twice on Thursday and left a message.   CSW spoke with patient at bedside and asked if he had anymore friends. Patient was very confused.   CSW will continue to follow.   Drucilla Schmidt, MSW, LCSW-A Clinical Social Worker [employer]

## 2018-10-24 NOTE — Progress Notes (Signed)
Physical Therapy Treatment Patient Details Name: Jacob Bass MRN: [id number] DOB: 12-26-1960 Today's Date: 10/24/2018    History of Present Illness Jacob Bass is an 58 y.o. male past medical history significant for hypertension, heart failure tobacco abuse and homeless presented with elevated troponins and question GI blood loss developed AMS and L side weakness after GI testing,  MRI with 13 mm acute infarct on the right external capsule and corona radiata, with several additional punctuated acute early subacute infarcts present bilaterally in basal ganglia and right anterior basal ganglia showed chronic hemorrhagic infarction and advanced microvascular ischemic changes.  GI team is advised is okay to resume antiplatelet therapy.    PT Comments    Pt always agreeable to participation.  Today laying in urine on arrival.  Pt sat EOB without assist and help wash up his frontal areas prior to standing activity.  The rest of the session was dedicated to gait with the RW including work on w/shifting/unweighting on the L LE and attempting to advanc the L LE without significant assist.    Follow Up Recommendations  SNF;Supervision/Assistance - 24 hour     Equipment Recommendations  Other (comment)    Recommendations for Other Services       Precautions / Restrictions Precautions Precautions: Fall    Mobility  Bed Mobility Overal bed mobility: Needs Assistance Bed Mobility: Supine to Sit     Supine to sit: Min assist;HOB elevated(extra time to rotate the L side forward.)     General bed mobility comments: Cues for UE placement and manual facilitation for L UE.  pt pivoted with/around right UE  Transfers Overall transfer level: Needs assistance Equipment used: Rolling walker (2 wheeled) Transfers: Sit to/from Stand Sit to Stand: Mod assist Stand pivot transfers: Mod assist       General transfer comment: RW gives just enough stability to allow assist by 1  person  Ambulation/Gait Ambulation/Gait assistance: Mod assist Gait Distance (Feet): 7 Feet Assistive device: Rolling walker (2 wheeled) Gait Pattern/deviations: Step-to pattern;Decreased step length - left;Decreased step length - right;Decreased stance time - left;Decreased stride length Gait velocity: decreased   General Gait Details: paretic gait on the Left with assist needed for w/shift and advancing the left LE once unweaighted.  Pt had difficulty managing assisted w/shift today, likely feeling he was shifting too far Right and kept reaching for stationary objects on the right.   Stairs             Wheelchair Mobility    Modified Rankin (Stroke Patients Only) Modified Rankin (Stroke Patients Only) Pre-Morbid Rankin Score: Moderate disability Modified Rankin: Moderately severe disability     Balance Overall balance assessment: Needs assistance Sitting-balance support: Feet supported Sitting balance-Leahy Scale: Fair     Standing balance support: Bilateral upper extremity supported Standing balance-Leahy Scale: Poor Standing balance comment: pt requires AD and external assist                            Cognition Arousal/Alertness: Awake/alert Behavior During Therapy: WFL for tasks assessed/performed Overall Cognitive Status: Impaired/Different from baseline                   Orientation Level: Place;Time;Situation     Following Commands: Follows one step commands consistently   Awareness: Intellectual Problem Solving: Slow processing        Exercises      General Comments        Pertinent Vitals/Pain Pain  Assessment: No/denies pain    Home Living                      Prior Function            PT Goals (current goals can now be found in the care plan section) Acute Rehab PT Goals Patient Stated Goal: none stated PT Goal Formulation: With patient Time For Goal Achievement: 10/31/18 Potential to Achieve Goals:  Good Progress towards PT goals: Progressing toward goals(continue with current goals x 1 more week)    Frequency    Min 3X/week      PT Plan Current plan remains appropriate    Co-evaluation              AM-PAC PT "6 Clicks" Mobility   Outcome Measure  Help needed turning from your back to your side while in a flat bed without using bedrails?: A Lot Help needed moving from lying on your back to sitting on the side of a flat bed without using bedrails?: A Lot Help needed moving to and from a bed to a chair (including a wheelchair)?: A Lot Help needed standing up from a chair using your arms (e.g., wheelchair or bedside chair)?: A Lot Help needed to walk in hospital room?: A Lot Help needed climbing 3-5 steps with a railing? : Total 6 Click Score: 11    End of Session     Patient left: with call bell/phone within reach;in chair;with chair alarm set Nurse Communication: Mobility status;Other (comment) PT Visit Diagnosis: Hemiplegia and hemiparesis;Other abnormalities of gait and mobility (R26.89) Hemiplegia - Right/Left: Left Hemiplegia - dominant/non-dominant: Non-dominant Hemiplegia - caused by: Cerebral infarction     Time: 1450-1520 PT Time Calculation (min) (ACUTE ONLY): 30 min  Charges:  $Gait Training: 8-22 mins $Therapeutic Activity: 8-22 mins                     10/24/2018  [place] Bing, PT Acute Rehabilitation Services [phone]  (pager) [phone]  (office)   [name] [name] 10/24/2018, 5:17 PM

## 2018-10-24 NOTE — Progress Notes (Addendum)
PROGRESS NOTE  Jacob Bass SMO:[id number] DOB: [birth year] DOA: 10/09/2018 PCP: Patient, No Pcp Per  HPI/Recap of past 24 hours:  Alert, interactive, speech is improving, left arm remain weak Continue to have cough, reports cough is new since in the hospital, no chest pain, no edema, no hypoxia at rest t max 100.9 last night  Still a littler confused, today he is able to tell me the year and month, but now sure if he is in the hospital or at home Reports breathing treatment helped, feeling better   no chest pain, no fever, no edema pe rRN, patient is getting all meds crushed, no overt sign of aspiration on dysphagia I diet  Assessment/Plan: Principal Problem:   Anemia, macrocytic Active Problems:   Chest pain   Elevated troponin   EKG abnormalities   Homelessness   Blood pressure elevated without history of HTN   Thrombocytopenia (HCC)   Cerebral thrombosis with cerebral infarction   Cerebral embolism with cerebral infarction   Subarachnoid hemorrhage   Intracerebral hemorrhage   Heme positive stool   Cirrhosis of liver without ascites (HCC)   Gastric ulcer   Fall   Dysphagia    Cough/wheezing/likely copd/bronchitis: -no fever, no chest pain, no hypoxia, h/o smoking (2pack a day) -cxr with mild pleural effusion, though no edema, will try one dose of lasix today -improved on nebs, mucinex, will continue  Stroke:  Multifocal small infarcts including right external capsule, right CR, left cerebellar -, per neurology suspect acute CVA due to "intracranial stenosis in the setting of hypotension and hypoglycemia." -acute cva result in  left facial droop and left ue weakness  - he is on dysphagia 1 diet, meds crushed, improving, diet advanced, continue aspiration precaution - awaiting skilled nursing facility placement.  Acute Metabolic encephalopathy: Likely multifactorial in the setting of volume depletion stroke alcohol liver cirrhosis and recurrent hypoglycemia.     Patient is back to baseline.  Episodes hypoglycemia: Likely due to decreased oral intake.  Dysphagia: Diet recommendations: Dysphagia 1 (puree);Thin liquid Liquids provided via: Cup Medication Administration: Crushed with puree Supervision: Patient able to self feed;Intermittent supervision to cue for compensatory strategies Compensations: Minimize environmental distractions;Lingual sweep for clearance of pocketing Doing better, diet advanced   Macrocytic anemia: Likely multifactorial in the setting of alcohol possible B12 deficiency and he had an EGD that showed a nonbleeding clean base ulcer. He declined colonoscopy Due to financial reasons and compliance and that the patient will be hard to follow-up, he will be sent home on aspirin and Protonix twice a day. GI to Follow up biopsy result   Mild troponin elevation opn presentation Troponin minimally elevated and flat at 0.04 >> 0.04 >> 0.05 >> 0.05 >> 0.04 >> 0.04. No chest pain,  Echo preserved lvef, with grade II diastolic dysfunction Cardiology consulted, recommend out patient follow up for further cardiovascular risk stratification .  SVT: Short run of SVT on tele, patient is asymptomatic Keep mag>2, K.4, continue betablocker  Hypokalemia/hypomagnesemia: Replace k, mag, repeat in am  Alcohol abuse/Hepatitis C /  Cirrhosis of liver without ascites (HCC)/thrombocytopenia, needs GI/ infectious disease follow up HIV negative   Status post fall: fall precaution /PT eval  Homeless: Child psychotherapist consulted  Oral thrush: topical nystatin  Code Status: full  Family Communication: patient   Disposition Plan: will need snf, difficult placement due to homeless issues   Consultants:  Neurology  GI  cardiology  Procedures:  EGD on 2/16  Antibiotics:  none   Objective:  BP (!) 149/91 (BP Location: Left Arm)   Pulse 69   Temp 98.5 F (36.9 C) (Oral)   Resp 17   Ht 5\' 3"  (1.6 m)   Wt 69.9 kg   SpO2  100%   BMI 27.28 kg/m   Intake/Output Summary (Last 24 hours) at 10/24/2018 1053 Last data filed at 10/24/2018 5953 Gross per 24 hour  Intake 80 ml  Output -  Net 80 ml   Filed Weights   10/18/18 0500 10/19/18 0500 10/19/18 0839  Weight: 68.5 kg 69.9 kg 69.9 kg    Exam: Patient is examined daily including today on 10/24/2018, exams remain the same as of yesterday except that has changed    General:  NAD, alert, not oriented to the month, poor dentition, + oral thrush   Cardiovascular: RRR  Respiratory: mild bilateral wheezing, right basilar rales  Abdomen: Soft/ND/NT, positive BS  Musculoskeletal: No Edema  Neuro: alert, interactive, pleasant  Data Reviewed: Basic Metabolic Panel: Recent Labs  Lab 10/18/18 0645 10/19/18 0727 10/24/18 0459  NA 138 137 139  K 4.0 3.6 3.6  CL 111 108 106  CO2 22 22 27   GLUCOSE 79 74 97  BUN 10 10 8   CREATININE 0.74 0.77 0.72  CALCIUM 7.6* 7.8* 8.0*  MG  --   --  1.6*   Liver Function Tests: Recent Labs  Lab 10/24/18 0459  AST 78*  ALT 56*  ALKPHOS 81  BILITOT 0.9  PROT 6.1*  ALBUMIN 2.1*   No results for input(s): LIPASE, AMYLASE in the last 168 hours. No results for input(s): AMMONIA in the last 168 hours. CBC: Recent Labs  Lab 10/24/18 0459  WBC 7.7  NEUTROABS 4.7  HGB 9.1*  HCT 28.2*  MCV 102.5*  PLT 157   Cardiac Enzymes:   No results for input(s): CKTOTAL, CKMB, CKMBINDEX, TROPONINI in the last 168 hours. BNP (last 3 results) No results for input(s): BNP in the last 8760 hours.  ProBNP (last 3 results) No results for input(s): PROBNP in the last 8760 hours.  CBG: Recent Labs  Lab 10/23/18 0825 10/23/18 1210 10/23/18 1949 10/23/18 2343 10/24/18 0605  GLUCAP 85 93 97 102* 98    No results found for this or any previous visit (from the past 240 hour(s)).   Studies: No results found.  Scheduled Meds: . amLODipine  2.5 mg Oral Daily  . aspirin  325 mg Oral Daily  . atorvastatin  40 mg Per  Tube q1800  . feeding supplement (ENSURE ENLIVE)  237 mL Oral Q1500  . feeding supplement (PRO-STAT SUGAR FREE 64)  30 mL Oral BID  . folic acid  1 mg Oral Daily  . furosemide  40 mg Intravenous Once  . guaiFENesin  600 mg Oral BID  . heparin injection (subcutaneous)  5,000 Units Subcutaneous Q8H  . lisinopril  40 mg Oral Daily  . metoprolol tartrate  25 mg Per Tube BID  . multivitamin with minerals  1 tablet Oral Daily  . nystatin  5 mL Oral QID  . pantoprazole  40 mg Oral BID  . polyethylene glycol powder  1 Container Oral Once  . potassium chloride  40 mEq Oral Once  . thiamine  100 mg Oral Daily    Continuous Infusions: . magnesium sulfate 1 - 4 g bolus IVPB       Time spent: I have personally reviewed and interpreted on  10/24/2018 daily labs, tele strips, imagings as discussed above under date review session  and assessment and plans.  I reviewed all nursing notes, pharmacy notes, consultant notes,  vitals, pertinent old records  I have discussed plan of care as described above with RN , patient  on 10/24/2018   Albertine GratesFang Dennys Guin MD, PhD  [facility] Pager [phone]. If 7PM-7AM, please contact night-coverage at [email or web address], password [email or web address] 10/24/2018, 10:53 AM  LOS: 13 days

## 2018-10-24 NOTE — Progress Notes (Signed)
  Speech Language Pathology Treatment: Dysphagia  Patient Details Name: Crystian Timbers MRN: [id number] DOB: 1961/01/12 Today's Date: 10/24/2018 Time: 1030-1100 SLP Time Calculation (min) (ACUTE ONLY): 30 min  Assessment / Plan / Recommendation Clinical Impression  Pt continues with baseline cough; it occurs throughout day and outside meals.  CXR 2/19 showed small bilateral pleural effusions, but no indication of potential aspiration. Pt's cranial nerve function and MS are improving - it's unlikely that swallow function has declined.  Today, he consumed upgraded trials of crackers/peanut butter mixed with thin liquids.  Pt demonstrated improved oral control and sensation, decreased left lateral residue, improved overall attention to solid bolus material.  There were no overt s/s of aspiration, even when taxed with mixed solid/liquid consistencies.  Pt continues to require assist with tray set; however, he is ready to upgrade to a soft mechanical diet. D/W Dr. Roda Shutters.  SLP will continue to follow while in acute care.     HPI HPI: 58 y.o. male with past medical history significant for hypertension, tobacco abuse, and homelessness; who presented 2/8 after slipping and falling in the rain.  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   On 2/9. pt presented with neuro changes with left sided weakness and dysarthria due to actue right ext capsule/basal ganglia/corona radiata CVA. EGD/colonscopy pending, but on hold due to changes in MS. Cortrak placed in IR 2/11. MRI 2/9: 13 mm acute/early subacute infarction within right external capsule and corona radiata. Several additional punctate acute/early subacute infarcts are present in bilateral basal ganglia. 3. 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 Advanced chronic microvascular ischemic changes and moderate volume loss of the brain.      SLP Plan  Continue with current plan of care       Recommendations  Diet  recommendations: Dysphagia 3 (mechanical soft);Thin liquid Liquids provided via: Cup;Straw Medication Administration: Crushed with puree Supervision: Patient able to self feed;Intermittent supervision to cue for compensatory strategies Compensations: Minimize environmental distractions                Oral Care Recommendations: Oral care BID Plan: Continue with current plan of care       GO              Jahlen Bollman L. Samson Frederic, MA CCC/SLP Acute Rehabilitation Services Office number [phone] Pager [phone]   [name] 10/24/2018, 11:21 AM

## 2018-10-24 NOTE — Progress Notes (Signed)
CSW received a phone call back from the friends landlord. They are able to have the patient stay with them once he is discharged from his short term rehab facility. CSW will alert the MD and the facility.   CSW will continue to follow.   Drucilla Schmidt, MSW, LCSW-A Clinical Social Worker [facility]

## 2018-10-25 DIAGNOSIS — J449 Chronic obstructive pulmonary disease, unspecified: Secondary | ICD-10-CM

## 2018-10-25 LAB — GLUCOSE, CAPILLARY
GLUCOSE-CAPILLARY: 101 mg/dL — AB (ref 70–99)
Glucose-Capillary: 108 mg/dL — ABNORMAL HIGH (ref 70–99)
Glucose-Capillary: 171 mg/dL — ABNORMAL HIGH (ref 70–99)
Glucose-Capillary: 84 mg/dL (ref 70–99)
Glucose-Capillary: 89 mg/dL (ref 70–99)
Glucose-Capillary: 92 mg/dL (ref 70–99)

## 2018-10-25 LAB — BASIC METABOLIC PANEL
Anion gap: 8 (ref 5–15)
BUN: 11 mg/dL (ref 6–20)
CO2: 27 mmol/L (ref 22–32)
Calcium: 7.9 mg/dL — ABNORMAL LOW (ref 8.9–10.3)
Chloride: 106 mmol/L (ref 98–111)
Creatinine, Ser: 0.76 mg/dL (ref 0.61–1.24)
GFR calc Af Amer: >60 mL/min (ref >60)
GLUCOSE: 85 mg/dL (ref 70–99)
Potassium: 3.9 mmol/L (ref 3.5–5.1)
Sodium: 141 mmol/L (ref 135–145)

## 2018-10-25 LAB — MAGNESIUM: Magnesium: 1.7 mg/dL (ref 1.7–2.4)

## 2018-10-25 MED ORDER — MAGNESIUM SULFATE 2 GM/50ML IV SOLN
2.0000 g | Freq: Once | INTRAVENOUS | Status: AC
  Administered 2018-10-25: 2 g via INTRAVENOUS
  Filled 2018-10-25: qty 50

## 2018-10-25 MED ORDER — VITAMIN B-12 1000 MCG PO TABS
1000.0000 ug | ORAL_TABLET | Freq: Every day | ORAL | Status: DC
  Administered 2018-10-25 – 2018-10-27 (×3): 1000 ug via ORAL
  Filled 2018-10-25 (×3): qty 1

## 2018-10-25 NOTE — Progress Notes (Signed)
PROGRESS NOTE  Jacob Bass HOO:[id number] DOB: 1961-07-06 DOA: 10/09/2018 PCP: Patient, No Pcp Per  HPI/Recap of past 24 hours:  Alert, interactive, speech is improving, left arm remain weak  cough and wheezing seems largely resolved,  no chest pain, no edema, no hypoxia at rest t max 100.9 on 2/20 evening, has been afebrile since  Still a littler confused, today he is able to tell me the year and month, he states he is at [place]  pe rRN, patient is getting all meds crushed, no overt sign of aspiration on dysphagia I diet  He has condom catheter on, 375cc urine output documented, not sure if accurate, he denies ab pain   Assessment/Plan: Principal Problem:   Anemia, macrocytic Active Problems:   Chest pain   Elevated troponin   EKG abnormalities   Homelessness   Blood pressure elevated without history of HTN   Thrombocytopenia (HCC)   Cerebral thrombosis with cerebral infarction   Cerebral embolism with cerebral infarction   Subarachnoid hemorrhage   Intracerebral hemorrhage   Heme positive stool   Cirrhosis of liver without ascites (HCC)   Gastric ulcer   Fall   Dysphagia    Cough/wheezing/likely copd/bronchitis ( noticed on 2/19): -no fever, no chest pain, no hypoxia, h/o smoking (2pack a day) -cxr with mild pleural effusion, though no edema, s/p one dose of iv lasix 40mg  x1 on 2/21 -improved, no cough, no wheezing today on 2/22, continue nebs, mucinex,  -incentive spirometer, out of bed  Stroke:  Multifocal small infarcts including right external capsule, right CR, left cerebellar (presenting symptom) -, per neurology suspect acute CVA due to "intracranial stenosis in the setting of hypotension and hypoglycemia." -acute cva result in  left facial droop and left ue weakness  -he is improving, tolerated diet advancement to soft diet, left arm remain weak - awaiting skilled nursing facility placement.  Acute Metabolic encephalopathy: Likely multifactorial in  the setting of volume depletion, stroke, alcohol liver cirrhosis and recurrent hypoglycemia.   Patient is back to baseline.  Episodes hypoglycemia: Likely due to decreased oral intake.  Dysphagia: Diet recommendations: Dysphagia 1 (puree);Thin liquid Liquids provided via: Cup Medication Administration: Crushed with puree Supervision: Patient able to self feed;Intermittent supervision to cue for compensatory strategies Compensations: Minimize environmental distractions;Lingual sweep for clearance of pocketing Doing better, diet advanced   Macrocytic anemia: Likely multifactorial in the setting of alcohol possible B12 deficiency and he had an EGD that showed a nonbleeding clean base ulcer. He declined colonoscopy Due to financial reasons and compliance and that the patient will be hard to follow-up, he will be sent home on aspirin and Protonix twice a day. GI to Follow up biopsy result   Mild troponin elevation opn presentation Troponin minimally elevated and flat at 0.04 >> 0.04 >> 0.05 >> 0.05 >> 0.04 >> 0.04. No chest pain,  Echo preserved lvef, with grade II diastolic dysfunction Cardiology consulted, recommend out patient follow up for further cardiovascular risk stratification .  SVT: Short run of SVT on tele, patient is asymptomatic Keep mag>2, K.4, continue betablocker  Hypokalemia/hypomagnesemia: k improved, mag remain low, continue to replace and repeat lab in am  Alcohol abuse/Hepatitis C /  Cirrhosis of liver without ascites (HCC)/thrombocytopenia, needs GI/ infectious disease follow up HIV negative   Status post fall: fall precaution /PT eval  Homeless: Child psychotherapist consulted  Oral thrush:improving on topical nystatin  Code Status: full  Family Communication: patient   Disposition Plan: will need snf, difficult placement  due to homeless issues   Consultants:  Neurology  GI  cardiology  Procedures:  EGD on  2/16  Antibiotics:  none   Objective: BP (!) 122/59 (BP Location: Left Arm)   Pulse (!) 59   Temp 98.6 F (37 C) (Axillary)   Resp 16   Ht 5\' 3"  (1.6 m)   Wt 69.9 kg   SpO2 97%   BMI 27.28 kg/m   Intake/Output Summary (Last 24 hours) at 10/25/2018 1022 Last data filed at 10/25/2018 4076 Gross per 24 hour  Intake -  Output 375 ml  Net -375 ml   Filed Weights   10/18/18 0500 10/19/18 0500 10/19/18 0839  Weight: 68.5 kg 69.9 kg 69.9 kg    Exam: Patient is examined daily including today on 10/25/2018, exams remain the same as of yesterday except that has changed    General:  NAD, alert, pleasant, slight confusion, poor dentition, + oral thrush   Cardiovascular: RRR  Respiratory: lung exam improved today, improved aeration, no wheezing, no rales today  Abdomen: Soft/ND/NT, positive BS  Musculoskeletal: No Edema  Neuro: alert, interactive, pleasant  Data Reviewed: Basic Metabolic Panel: Recent Labs  Lab 10/19/18 0727 10/24/18 0459 10/25/18 0602  NA 137 139 141  K 3.6 3.6 3.9  CL 108 106 106  CO2 22 27 27   GLUCOSE 74 97 85  BUN 10 8 11   CREATININE 0.77 0.72 0.76  CALCIUM 7.8* 8.0* 7.9*  MG  --  1.6* 1.7   Liver Function Tests: Recent Labs  Lab 10/24/18 0459  AST 78*  ALT 56*  ALKPHOS 81  BILITOT 0.9  PROT 6.1*  ALBUMIN 2.1*   No results for input(s): LIPASE, AMYLASE in the last 168 hours. No results for input(s): AMMONIA in the last 168 hours. CBC: Recent Labs  Lab 10/24/18 0459  WBC 7.7  NEUTROABS 4.7  HGB 9.1*  HCT 28.2*  MCV 102.5*  PLT 157   Cardiac Enzymes:   No results for input(s): CKTOTAL, CKMB, CKMBINDEX, TROPONINI in the last 168 hours. BNP (last 3 results) No results for input(s): BNP in the last 8760 hours.  ProBNP (last 3 results) No results for input(s): PROBNP in the last 8760 hours.  CBG: Recent Labs  Lab 10/24/18 1129 10/24/18 1925 10/24/18 2303 10/25/18 0313 10/25/18 0807  GLUCAP 95 84 90 89 84    No  results found for this or any previous visit (from the past 240 hour(s)).   Studies: No results found.  Scheduled Meds: . amLODipine  2.5 mg Oral Daily  . aspirin  325 mg Oral Daily  . atorvastatin  40 mg Per Tube q1800  . feeding supplement (ENSURE ENLIVE)  237 mL Oral Q1500  . feeding supplement (PRO-STAT SUGAR FREE 64)  30 mL Oral BID  . folic acid  1 mg Oral Daily  . guaiFENesin  600 mg Oral BID  . heparin injection (subcutaneous)  5,000 Units Subcutaneous Q8H  . lisinopril  40 mg Oral Daily  . metoprolol tartrate  25 mg Per Tube BID  . multivitamin with minerals  1 tablet Oral Daily  . nystatin  5 mL Oral QID  . pantoprazole  40 mg Oral BID  . polyethylene glycol powder  1 Container Oral Once  . thiamine  100 mg Oral Daily  . vitamin B-12  1,000 mcg Oral Daily    Continuous Infusions: . magnesium sulfate 1 - 4 g bolus IVPB       Time spent: I  have personally reviewed and interpreted on  10/25/2018 daily labs, tele strips, imagings as discussed above under date review session and assessment and plans.  I reviewed all nursing notes, pharmacy notes, consultant notes,  vitals, pertinent old records  I have discussed plan of care as described above with RN , patient  on 10/25/2018   [name] MD, PhD  [facility] Pager [phone]. If 7PM-7AM, please contact night-coverage at [email or web address], password [email or web address] 10/25/2018, 10:22 AM  LOS: 14 days

## 2018-10-25 NOTE — Progress Notes (Signed)
CSW spoke with the patients friend. He stated that he will be completing the paperwork with [facility] on Monday. The patient should be able to discharge once the paperwork for skilled nursing is complete.   No further weekend needs. CSW will continue to follow on Monday and assist with discharge planning. CSW will need to follow up with admissions director to see if paperwork was completed.   Drucilla Schmidt, MSW, LCSW-A Clinical Social Worker [facility] [employer]

## 2018-10-26 ENCOUNTER — Inpatient Hospital Stay (HOSPITAL_COMMUNITY): Payer: Medicaid Other

## 2018-10-26 DIAGNOSIS — I340 Nonrheumatic mitral (valve) insufficiency: Secondary | ICD-10-CM

## 2018-10-26 DIAGNOSIS — J438 Other emphysema: Secondary | ICD-10-CM

## 2018-10-26 LAB — BASIC METABOLIC PANEL
Anion gap: 7 (ref 5–15)
BUN: 12 mg/dL (ref 6–20)
CO2: 25 mmol/L (ref 22–32)
Calcium: 7.9 mg/dL — ABNORMAL LOW (ref 8.9–10.3)
Chloride: 106 mmol/L (ref 98–111)
Creatinine, Ser: 0.74 mg/dL (ref 0.61–1.24)
GFR calc Af Amer: >60 mL/min (ref >60)
GFR calc non Af Amer: >60 mL/min (ref >60)
Glucose, Bld: 89 mg/dL (ref 70–99)
POTASSIUM: 3.8 mmol/L (ref 3.5–5.1)
Sodium: 138 mmol/L (ref 135–145)

## 2018-10-26 LAB — GLUCOSE, CAPILLARY
GLUCOSE-CAPILLARY: 100 mg/dL — AB (ref 70–99)
Glucose-Capillary: 138 mg/dL — ABNORMAL HIGH (ref 70–99)
Glucose-Capillary: 86 mg/dL (ref 70–99)
Glucose-Capillary: 101 mg/dL — ABNORMAL HIGH (ref 70–99)
Glucose-Capillary: 93 mg/dL (ref 70–99)
Glucose-Capillary: 111 mg/dL — ABNORMAL HIGH (ref 70–99)
Glucose-Capillary: 96 mg/dL (ref 70–99)

## 2018-10-26 LAB — T4, FREE: Free T4: 0.95 ng/dL (ref 0.82–1.77)

## 2018-10-26 LAB — URINALYSIS, ROUTINE W REFLEX MICROSCOPIC
Bilirubin Urine: NEGATIVE
Glucose, UA: NEGATIVE mg/dL
Hgb urine dipstick: NEGATIVE
Ketones, ur: NEGATIVE mg/dL
Nitrite: NEGATIVE
Protein, ur: NEGATIVE mg/dL
Specific Gravity, Urine: 1.026 (ref 1.005–1.030)
pH: 7 (ref 5.0–8.0)

## 2018-10-26 LAB — ECHOCARDIOGRAM COMPLETE
Height: 63 in
Weight: 2448 oz

## 2018-10-26 LAB — IRON AND TIBC
Iron: 19 ug/dL — ABNORMAL LOW (ref 45–182)
Saturation Ratios: 9 % — ABNORMAL LOW (ref 17.9–39.5)
TIBC: 200 ug/dL — AB (ref 250–450)
UIBC: 181 ug/dL

## 2018-10-26 LAB — MAGNESIUM: Magnesium: 1.6 mg/dL — ABNORMAL LOW (ref 1.7–2.4)

## 2018-10-26 MED ORDER — MAGNESIUM SULFATE 4 GM/100ML IV SOLN
4.0000 g | Freq: Once | INTRAVENOUS | Status: AC
  Administered 2018-10-26: 4 g via INTRAVENOUS
  Filled 2018-10-26: qty 100

## 2018-10-26 MED ORDER — ALUM & MAG HYDROXIDE-SIMETH 200-200-20 MG/5ML PO SUSP
30.0000 mL | ORAL | Status: DC | PRN
  Filled 2018-10-26: qty 30

## 2018-10-26 MED ORDER — LISINOPRIL 20 MG PO TABS
20.0000 mg | ORAL_TABLET | Freq: Every day | ORAL | Status: DC
  Administered 2018-10-26: 20 mg via ORAL
  Filled 2018-10-26: qty 1

## 2018-10-26 NOTE — Progress Notes (Signed)
  Echocardiogram 2D Echocardiogram has been performed.  Travia Onstad T Kaysi Ourada 10/26/2018, 3:34 PM

## 2018-10-26 NOTE — Consult Note (Addendum)
Cardiology Consultation:   Patient ID: Jacob Bass MRN: [id number]; DOB: 12-12-1960  Admit date: 10/09/2018 Date of Consult: 10/26/2018  Primary Care Provider: Patient, No Pcp Per Primary Cardiologist: Parke Poisson, MD * Primary Electrophysiologist:  None    Patient Profile:   Jacob Bass is a 58 y.o. male with a hx of homelessness and HTN who is being seen today for the evaluation of possible pericardial effusion at the request of Dr Roda Shutters.  History of Present Illness:   Jacob Bass is an unfortunate 58 year old African-American male who was admitted February 6 after being found down on the street.  He was admitted and ruled in for a stroke.  Cardiology was asked to see for an elevated troponin.  Echocardiogram done 10/10/2018 showed normal LV function grade 2 diastolic dysfunction, no further cardiac work-up was recommended.  Patient has been awaiting nursing home placement.  He is developed a cough.  Chest x-ray was done today which showed an increase in the size of the cardiac silhouette suggestive of a pericardial effusion.  We have been asked to consult.  A stat echocardiogram is been ordered the results are pending.  On exam the patient is flat in bed and comfortable.  Denies any unusual chest pain or shortness of breath.  His vital signs are stable.  His EKG today shows normal sinus rhythm at 64 with LVH.  Past Medical History:  Diagnosis Date  . CHF (congestive heart failure) (HCC)   . Hypertension     Past Surgical History:  Procedure Laterality Date  . BIOPSY  10/19/2018   Procedure: BIOPSY;  Surgeon: Benancio Deeds, MD;  Location: [facility] ENDOSCOPY;  Service: Gastroenterology;;  . ESOPHAGOGASTRODUODENOSCOPY (EGD) WITH PROPOFOL N/A 10/19/2018   Procedure: ESOPHAGOGASTRODUODENOSCOPY (EGD);  Surgeon: Benancio Deeds, MD;  Location: [facility] ENDOSCOPY;  Service: Gastroenterology;  Laterality: N/A;     Home Medications:  Prior to Admission medications   Medication Sig Start Date  End Date Taking? Authorizing Provider  amLODipine (NORVASC) 2.5 MG tablet Take 1 tablet (2.5 mg total) by mouth daily. 10/20/18   Marinda Elk, MD  aspirin 81 MG EC tablet Take 1 tablet (81 mg total) by mouth daily. Swallow whole. 10/20/18   Marinda Elk, MD  atorvastatin (LIPITOR) 40 MG tablet Place 1 tablet (40 mg total) into feeding tube daily at 6 PM. 10/20/18   Marinda Elk, MD  lisinopril (PRINIVIL,ZESTRIL) 40 MG tablet Take 1 tablet (40 mg total) by mouth daily. 10/20/18   Marinda Elk, MD  metoprolol tartrate (LOPRESSOR) 25 MG tablet Take 1 tablet (25 mg total) by mouth 2 (two) times daily. 10/20/18   Marinda Elk, MD  pantoprazole (PROTONIX) 40 MG tablet Take 1 tablet (40 mg total) by mouth 2 (two) times daily. 10/20/18   Marinda Elk, MD    Inpatient Medications: Scheduled Meds: . amLODipine  2.5 mg Oral Daily  . aspirin  325 mg Oral Daily  . atorvastatin  40 mg Per Tube q1800  . feeding supplement (ENSURE ENLIVE)  237 mL Oral Q1500  . feeding supplement (PRO-STAT SUGAR FREE 64)  30 mL Oral BID  . folic acid  1 mg Oral Daily  . guaiFENesin  600 mg Oral BID  . heparin injection (subcutaneous)  5,000 Units Subcutaneous Q8H  . lisinopril  20 mg Oral Daily  . metoprolol tartrate  25 mg Per Tube BID  . multivitamin with minerals  1 tablet Oral Daily  . nystatin  5 mL  Oral QID  . pantoprazole  40 mg Oral BID  . polyethylene glycol powder  1 Container Oral Once  . thiamine  100 mg Oral Daily  . vitamin B-12  1,000 mcg Oral Daily   Continuous Infusions:  PRN Meds: acetaminophen, alum & mag hydroxide-simeth, ipratropium-albuterol, ondansetron (ZOFRAN) IV  Allergies:    Allergies  Allergen Reactions  . Penicillins Hives    Social History:   Social History   Socioeconomic History  . Marital status: Single    Spouse name: Not on file  . Number of children: Not on file  . Years of education: Not on file  . Highest education level:  Not on file  Occupational History  . Not on file  Social Needs  . Financial resource strain: Not on file  . Food insecurity:    Worry: Not on file    Inability: Not on file  . Transportation needs:    Medical: Not on file    Non-medical: Not on file  Tobacco Use  . Smoking status: Current Every Day Smoker    Packs/day: 2.00    Types: Cigarettes  . Smokeless tobacco: Never Used  Substance and Sexual Activity  . Alcohol use: Yes    Frequency: Never    Comment: a case a day  . Drug use: No  . Sexual activity: Not on file  Lifestyle  . Physical activity:    Days per week: Not on file    Minutes per session: Not on file  . Stress: Not on file  Relationships  . Social connections:    Talks on phone: Not on file    Gets together: Not on file    Attends religious service: Not on file    Active member of club or organization: Not on file    Attends meetings of clubs or organizations: Not on file    Relationship status: Not on file  . Intimate partner violence:    Fear of current or ex partner: Not on file    Emotionally abused: Not on file    Physically abused: Not on file    Forced sexual activity: Not on file  Other Topics Concern  . Not on file  Social History Narrative  . Not on file    Family History:   History reviewed. No pertinent family history.   ROS:  Please see the history of present illness.  All other ROS reviewed and negative.     Physical Exam/Data:   Vitals:   10/26/18 0342 10/26/18 0751 10/26/18 0853 10/26/18 1137  BP: (!) 118/56 127/73  (!) 133/59  Pulse: (!) 57 (!) 55 64 (!) 52  Resp: Temp: 99.1 F (37.3 C) 98.9 F (37.2 C)  98.7 F (37.1 C)  TempSrc: Oral Axillary  Oral  SpO2: 99% 97%  100%  Weight: 69.4 kg     Height:        Intake/Output Summary (Last 24 hours) at 10/26/2018 1552 Last data filed at 10/26/2018 1500 Gross per 24 hour  Intake 240 ml  Output 1051 ml  Net -811 ml   Last 3 Weights 10/26/2018 10/19/2018  10/19/2018  Weight (lbs) 153 lb 154 lb 154 lb  Weight (kg) 69.4 kg 69.854 kg 69.854 kg     Body mass index is 27.1 kg/m.  General:  Well nourished, well developed, in no acute distress porr dentition HEENT: normal Lymph: no adenopathy Neck: no JVD Endocrine:  No thryomegaly Vascular: No carotid bruits;  FA pulses 2+ bilaterally without bruits  Cardiac:  normal S1, S2; RRR; no murmur  Lungs:  clear to auscultation bilaterally, no wheezing, rhonchi or rales  Abd: soft, nontender, no hepatomegaly  Ext: no edema Musculoskeletal:  No deformities, BUE and BLE strength normal and equal Skin: warm and dry  Neuro:  CNs 2-12 intact, no focal abnormalities noted, Lt sided weakness Psych:  Normal affect   EKG:  The EKG was personally reviewed and demonstrates:  NSR, HR 60, LVH Telemetry:  Telemetry was personally reviewed and demonstrates:  Not on telemetry  Relevant CV Studies: Echo pending  Laboratory Data:  Chemistry Recent Labs  Lab 10/24/18 0459 10/25/18 0602 10/26/18 0544  NA 139 141 138  K 3.6 3.9 3.8  CL 106 106 106  CO2 27 27 25   GLUCOSE 97 85 89  BUN 8 11 12   CREATININE 0.72 0.76 0.74  CALCIUM 8.0* 7.9* 7.9*  GFRNONAA >60 >60 >60  GFRAA >60 >60 >60  ANIONGAP 6 8 7     Recent Labs  Lab 10/24/18 0459  PROT 6.1*  ALBUMIN 2.1*  AST 78*  ALT 56*  ALKPHOS 81  BILITOT 0.9   Hematology Recent Labs  Lab 10/24/18 0459  WBC 7.7  RBC 2.75*  HGB 9.1*  HCT 28.2*  MCV 102.5*  MCH 33.1  MCHC 32.3  RDW 14.8  PLT 157   Cardiac EnzymesNo results for input(s): TROPONINI in the last 168 hours. No results for input(s): TROPIPOC in the last 168 hours.  BNPNo results for input(s): BNP, PROBNP in the last 168 hours.  DDimer No results for input(s): DDIMER in the last 168 hours.  Radiology/Studies:  Dg Chest 2 Bass  Result Date: 10/26/2018 CLINICAL DATA:  Fever. Shortness of breath and cough. Risk of aspiration. EXAM: CHEST - 2 Bass COMPARISON:  10/22/2018 FINDINGS:  There has been interval increase in the size of the cardiac silhouette, which represents a change when compared to the most recent radiograph dated 10/22/2018. There is no evidence of focal airspace consolidation, pleural effusion or pneumothorax. Possible mild peribronchial opacities in the bilateral lower lobes. Osseous structures are without acute abnormality. Soft tissues are grossly normal. IMPRESSION: 1. Interval increase in the size of the cardiac silhouette, which represents a change when compared to the most recent radiograph dated 10/22/2018. Given the rapid change, pericardial effusion is of primary consideration. 2. Possible mild peribronchial opacities in the bilateral lower lobes. Electronically Signed   By: Ted Mcalpine M.D.   On: 10/26/2018 14:28   Dg Chest 2 Bass  Result Date: 10/22/2018 CLINICAL DATA:  Dry cough for 1 week EXAM: CHEST - 2 Bass COMPARISON:  10/13/2018 FINDINGS: Normal heart size. Clear lungs. Small bilateral pleural effusions. No pneumothorax. IMPRESSION: Small bilateral pleural effusion. Electronically Signed   By: Jolaine Click M.D.   On: 10/22/2018 17:21    Assessment and Plan:   S/P CVA Rt brain with lt hemiparesis, awaiting SNF  Enlarged heart on CXR Echo pending, no evidence of tamponade on exam  HTN Controlled on current medications  Homeless Per primary service  Plan- echo pending- MD to see- further recommendations to follow.      For questions or updates, please contact [facility] Please consult [email or web address] for contact info under     Signed, Corine Shelter, Cordelia Poche  10/26/2018 3:52 PM   Cardiology Attending  Patient seen and examined. Agree with above. He has no pericardial effusion by echo and normal LV and RV function. No additional recommendations.  Mikle Bosworth.D.

## 2018-10-26 NOTE — Progress Notes (Signed)
Pt continues to refuse to transfer to chair, continues to refuse to reposition in bed, states he is comfortable and he is not moving, has been educated by multiple staff members on the importance of mobility and repositioning. Jorge Ny

## 2018-10-26 NOTE — Progress Notes (Signed)
Pt refused to transfer to chair for meals, educated pt on the importance of get out of bed and the risk for aspiration should he not. Pt continued to refuse. MD made aware. Jorge Ny

## 2018-10-26 NOTE — Progress Notes (Addendum)
PROGRESS NOTE  Jacob Bass JTT:[id number] DOB: 14-Feb-1961 DOA: 10/09/2018 PCP: Patient, No Pcp Per  HPI/Recap of past 24 hours:  t max 101.6 last night,  no chest pain, no edema, no hypoxia at rest, denies ab pain, no diarrhea,  Urine output 600cc documented last 24hrs, not sure if accurate RN reports noticed cough when patient eats breakfast,  Patient reports cough has resolved ( patient is not a reliable historian though)  He is Alert, interactive, speech is improving, left arm remain weak  Still a littler confused, today he is able to tell me the year and month, he states he is at [facility] long  He refuses to get out bed to chair  Assessment/Plan: Principal Problem:   Anemia, macrocytic Active Problems:   Chest pain   Elevated troponin   EKG abnormalities   Homelessness   Blood pressure elevated without history of HTN   Thrombocytopenia (HCC)   Cerebral thrombosis with cerebral infarction   Cerebral embolism with cerebral infarction   Subarachnoid hemorrhage   Intracerebral hemorrhage   Heme positive stool   Cirrhosis of liver without ascites (HCC)   Gastric ulcer   Fall   Dysphagia   Hypomagnesemia   Chronic obstructive pulmonary disease (HCC)  Fever 101.6 on  2/22 7pm Patient denies symptom,  Will get ua,blood culture, cxr  bladder scan (poor urine output, rule out urinary retention), incentive spirometer Fever could be from hepatitis, will Monitor off abx for now  Addendum: called by radiology to reports abrupt enlargement of his heart compare to cxr obtained a few days ago, stat echo ordered, cardiology consulted. He is currently hemodynamically stable, denies chest pain, no sob, no hypoxia, no edema.   Cough/wheezing/likely copd/bronchitis ( noticed on 2/19): -no fever, no chest pain, no hypoxia, h/o smoking (2pack a day) -cxr with mild pleural effusion, though no edema, s/p one dose of iv lasix 40mg  x1 on 2/21 -improved, no cough, no wheezing since  2/22,  continue nebs, mucinex,  -incentive spirometer, out of bed  Stroke:  Multifocal small infarcts including right external capsule, right CR, left cerebellar (presenting symptom) -mri brain on presentation:  1. Limited motion degraded study. 2. 13 mm acute/early subacute infarction within right external capsule and corona radiata. Several additional punctate acute/early subacute infarcts are present in bilateral basal ganglia. 3. 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 Advanced chronic microvascular ischemic changes and moderate volume loss of the brain -, per neurology suspect acute CVA due to "intracranial stenosis in the setting of hypotension and hypoglycemia." -acute cva result in  left facial droop and left ue weakness  -he is improving, tolerated diet advancement to soft diet, left arm remain weak - awaiting skilled nursing facility placement.  Acute Metabolic encephalopathy: Likely multifactorial in the setting of volume depletion, stroke, alcohol liver cirrhosis and recurrent hypoglycemia.   Patient is back to baseline. Possible baseline vascular dementia  Episodes hypoglycemia: Likely due to decreased oral intake.  Dysphagia: Diet recommendations: Dysphagia 1 (puree);Thin liquid Liquids provided via: Cup Medication Administration: Crushed with puree Supervision: Patient able to self feed;Intermittent supervision to cue for compensatory strategies Compensations: Minimize environmental distractions;Lingual sweep for clearance of pocketing Doing better, diet advanced   Macrocytic anemia: Likely multifactorial in the setting of alcohol possible B12 deficiency and he had an EGD that showed a nonbleeding clean base ulcer. He declined colonoscopy Due to financial reasons and compliance and that the patient will be hard to follow-up, he will be sent home on aspirin and  Protonix twice a day. GI to Follow up biopsy result   Mild troponin elevation opn  presentation Troponin minimally elevated and flat at 0.04 >> 0.04 >> 0.05 >> 0.05 >> 0.04 >> 0.04. No chest pain,  Echo preserved lvef, with grade II diastolic dysfunction Cardiology consulted, recommend out patient follow up for further cardiovascular risk stratification .  SVT: Short run of SVT on tele, patient is asymptomatic Keep mag>2, K.4, continue betablocker  Hypokalemia/hypomagnesemia: k improved, mag remain low, continue to replace and repeat lab in am  Alcohol abuse/Hepatitis C /  Cirrhosis of liver without ascites (HCC)/thrombocytopenia, needs GI/ infectious disease follow up HIV negative   Status post fall: fall precaution /PT eval  Homeless: Child psychotherapist consulted  Oral thrush:improving on topical nystatin  Code Status: full  Family Communication: patient   Disposition Plan: will need snf, difficult placement due to homeless issues   Consultants:  Neurology  GI  cardiology  Procedures:  EGD on 2/16  Antibiotics:  none   Objective: BP 127/73 (BP Location: Right Arm)   Pulse (!) 55   Temp 98.9 F (37.2 C) (Axillary)   Resp 20   Ht 5\' 3"  (1.6 m)   Wt 69.4 kg   SpO2 97%   BMI 27.10 kg/m   Intake/Output Summary (Last 24 hours) at 10/26/2018 1007 Last data filed at 10/25/2018 2030 Gross per 24 hour  Intake -  Output 600 ml  Net -600 ml   Filed Weights   10/19/18 0500 10/19/18 0839 10/26/18 0342  Weight: 69.9 kg 69.9 kg 69.4 kg    Exam: Patient is examined daily including today on 10/26/2018, exams remain the same as of yesterday except that has changed    General:  NAD, alert, pleasant, slight confusion, poor dentition, + oral thrush   Cardiovascular: RRR  Respiratory: lung exam improved today, improved aeration, no wheezing, no rales today  Abdomen: Soft/ND/NT, positive BS  Musculoskeletal: No Edema  Neuro: alert, interactive, pleasant, left arm weakness  Data Reviewed: Basic Metabolic Panel: Recent Labs  Lab  10/24/18 0459 10/25/18 0602 10/26/18 0544  NA 139 141 138  K 3.6 3.9 3.8  CL 106 106 106  CO2 27 27 25   GLUCOSE 97 85 89  BUN 8 11 12   CREATININE 0.72 0.76 0.74  CALCIUM 8.0* 7.9* 7.9*  MG 1.6* 1.7 1.6*   Liver Function Tests: Recent Labs  Lab 10/24/18 0459  AST 78*  ALT 56*  ALKPHOS 81  BILITOT 0.9  PROT 6.1*  ALBUMIN 2.1*   No results for input(s): LIPASE, AMYLASE in the last 168 hours. No results for input(s): AMMONIA in the last 168 hours. CBC: Recent Labs  Lab 10/24/18 0459  WBC 7.7  NEUTROABS 4.7  HGB 9.1*  HCT 28.2*  MCV 102.5*  PLT 157   Cardiac Enzymes:   No results for input(s): CKTOTAL, CKMB, CKMBINDEX, TROPONINI in the last 168 hours. BNP (last 3 results) No results for input(s): BNP in the last 8760 hours.  ProBNP (last 3 results) No results for input(s): PROBNP in the last 8760 hours.  CBG: Recent Labs  Lab 10/25/18 1117 10/25/18 1648 10/25/18 1953 10/25/18 2346 10/26/18 0327  GLUCAP 92 108* 101* 171* 138*    No results found for this or any previous visit (from the past 240 hour(s)).   Studies: No results found.  Scheduled Meds: . amLODipine  2.5 mg Oral Daily  . aspirin  325 mg Oral Daily  . atorvastatin  40 mg Per Tube  [id number]  . feeding supplement (ENSURE ENLIVE)  237 mL Oral Q1500  . feeding supplement (PRO-STAT SUGAR FREE 64)  30 mL Oral BID  . folic acid  1 mg Oral Daily  . guaiFENesin  600 mg Oral BID  . heparin injection (subcutaneous)  5,000 Units Subcutaneous Q8H  . lisinopril  20 mg Oral Daily  . metoprolol tartrate  25 mg Per Tube BID  . multivitamin with minerals  1 tablet Oral Daily  . nystatin  5 mL Oral QID  . pantoprazole  40 mg Oral BID  . polyethylene glycol powder  1 Container Oral Once  . thiamine  100 mg Oral Daily  . vitamin B-12  1,000 mcg Oral Daily    Continuous Infusions: . magnesium sulfate 1 - 4 g bolus IVPB       Time spent: 25mins I have personally reviewed and interpreted on   10/26/2018 daily labs, tele strips, imagings as discussed above under date review session and assessment and plans.  I reviewed all nursing notes, pharmacy notes, consultant notes,  vitals, pertinent old records  I have discussed plan of care as described above with RN , patient  on 10/26/2018   Albertine GratesFang Hosteen Kienast MD, PhD  [facility] Pager [phone]. If 7PM-7AM, please contact night-coverage at [email or web address], password [email or web address] 10/26/2018, 7:52 AM  LOS: 15 days

## 2018-10-27 ENCOUNTER — Telehealth: Payer: Self-pay

## 2018-10-27 ENCOUNTER — Inpatient Hospital Stay (HOSPITAL_COMMUNITY): Payer: Medicaid Other

## 2018-10-27 DIAGNOSIS — R531 Weakness: Secondary | ICD-10-CM

## 2018-10-27 DIAGNOSIS — E876 Hypokalemia: Secondary | ICD-10-CM

## 2018-10-27 LAB — CBC WITH DIFFERENTIAL/PLATELET
ABS IMMATURE GRANULOCYTES: 0.02 10*3/uL (ref 0.00–0.07)
Basophils Absolute: 0 10*3/uL (ref 0.0–0.1)
Basophils Relative: 0 %
Eosinophils Absolute: 0.1 10*3/uL (ref 0.0–0.5)
Eosinophils Relative: 1 %
HCT: 27.8 % — ABNORMAL LOW (ref 39.0–52.0)
Hemoglobin: 9.2 g/dL — ABNORMAL LOW (ref 13.0–17.0)
Immature Granulocytes: 0 %
Lymphocytes Relative: 33 %
Lymphs Abs: 2.3 10*3/uL (ref 0.7–4.0)
MCH: 33.3 pg (ref 26.0–34.0)
MCHC: 33.1 g/dL (ref 30.0–36.0)
MCV: 100.7 fL — ABNORMAL HIGH (ref 80.0–100.0)
MONOS PCT: 12 %
Monocytes Absolute: 0.8 10*3/uL (ref 0.1–1.0)
Neutro Abs: 3.7 10*3/uL (ref 1.7–7.7)
Neutrophils Relative %: 54 %
Platelets: 127 10*3/uL — ABNORMAL LOW (ref 150–400)
RBC: 2.76 MIL/uL — ABNORMAL LOW (ref 4.22–5.81)
RDW: 14.5 % (ref 11.5–15.5)
WBC: 6.8 10*3/uL (ref 4.0–10.5)
nRBC: 0 % (ref 0.0–0.2)

## 2018-10-27 LAB — BASIC METABOLIC PANEL
Anion gap: 8 (ref 5–15)
BUN: 13 mg/dL (ref 6–20)
CO2: 24 mmol/L (ref 22–32)
Calcium: 8 mg/dL — ABNORMAL LOW (ref 8.9–10.3)
Chloride: 105 mmol/L (ref 98–111)
Creatinine, Ser: 0.81 mg/dL (ref 0.61–1.24)
GFR calc Af Amer: >60 mL/min (ref >60)
GFR calc non Af Amer: >60 mL/min (ref >60)
Glucose, Bld: 122 mg/dL — ABNORMAL HIGH (ref 70–99)
POTASSIUM: 3.7 mmol/L (ref 3.5–5.1)
SODIUM: 137 mmol/L (ref 135–145)

## 2018-10-27 LAB — GLUCOSE, CAPILLARY
GLUCOSE-CAPILLARY: 120 mg/dL — AB (ref 70–99)
Glucose-Capillary: 74 mg/dL (ref 70–99)
Glucose-Capillary: 92 mg/dL (ref 70–99)
Glucose-Capillary: 82 mg/dL (ref 70–99)

## 2018-10-27 LAB — MAGNESIUM: Magnesium: 1.6 mg/dL — ABNORMAL LOW (ref 1.7–2.4)

## 2018-10-27 LAB — T3, FREE: T3, Free: 2.1 pg/mL (ref 2.0–4.4)

## 2018-10-27 MED ORDER — ASPIRIN 325 MG PO TABS: 325.0000 mg | ORAL_TABLET | Freq: Every day | ORAL | Status: DC

## 2018-10-27 MED ORDER — RESOURCE THICKENUP CLEAR PO POWD
Freq: Once | ORAL | Status: AC
  Administered 2018-10-27: 13:00:00 via ORAL
  Filled 2018-10-27: qty 125

## 2018-10-27 MED ORDER — LISINOPRIL 5 MG PO TABS: 5.0000 mg | ORAL_TABLET | Freq: Every day | ORAL | 11 refills | Status: DC

## 2018-10-27 MED ORDER — TAMSULOSIN HCL 0.4 MG PO CAPS: 0.4000 mg | ORAL_CAPSULE | Freq: Every day | ORAL | Status: DC

## 2018-10-27 MED ORDER — ENSURE ENLIVE PO LIQD: 237.0000 mL | Freq: Every day | ORAL | 12 refills | Status: AC

## 2018-10-27 MED ORDER — IPRATROPIUM-ALBUTEROL 0.5-2.5 (3) MG/3ML IN SOLN: 3.0000 mL | RESPIRATORY_TRACT | Status: DC | PRN

## 2018-10-27 MED ORDER — MAGNESIUM SULFATE 4 GM/100ML IV SOLN
4.0000 g | Freq: Once | INTRAVENOUS | Status: AC
  Administered 2018-10-27: 4 g via INTRAVENOUS
  Filled 2018-10-27: qty 100

## 2018-10-27 MED ORDER — LISINOPRIL 10 MG PO TABS: 10.0000 mg | ORAL_TABLET | Freq: Every day | ORAL | 3 refills | Status: DC

## 2018-10-27 MED ORDER — FUROSEMIDE 10 MG/ML IJ SOLN
40.0000 mg | Freq: Once | INTRAMUSCULAR | Status: AC
  Administered 2018-10-27: 40 mg via INTRAVENOUS
  Filled 2018-10-27: qty 4

## 2018-10-27 MED ORDER — CYANOCOBALAMIN 1000 MCG PO TABS: 1000.0000 ug | ORAL_TABLET | Freq: Every day | ORAL | Status: DC

## 2018-10-27 MED ORDER — GUAIFENESIN ER 600 MG PO TB12: 600.0000 mg | ORAL_TABLET | Freq: Two times a day (BID) | ORAL | Status: DC

## 2018-10-27 MED ORDER — LISINOPRIL 10 MG PO TABS
10.0000 mg | ORAL_TABLET | Freq: Every day | ORAL | Status: DC
  Administered 2018-10-27: 10 mg via ORAL
  Filled 2018-10-27 (×2): qty 1

## 2018-10-27 MED ORDER — FOLIC ACID 1 MG PO TABS: 1.0000 mg | ORAL_TABLET | Freq: Every day | ORAL | Status: AC

## 2018-10-27 MED ORDER — POTASSIUM CHLORIDE CRYS ER 20 MEQ PO TBCR
40.0000 meq | EXTENDED_RELEASE_TABLET | Freq: Once | ORAL | Status: AC
  Administered 2018-10-27: 40 meq via ORAL
  Filled 2018-10-27: qty 2

## 2018-10-27 NOTE — Progress Notes (Signed)
Patient being discharged to [facility]. IV removed and CCMD notified. All belongings with patient/PTAR. Patient leaving unit via [facility]AR. Report given to [facility].

## 2018-10-27 NOTE — Telephone Encounter (Signed)
See pathology result note from 10-19-18. Pt needs an appt with Dr. Adela Lank but we do not have contact information for him as he is homeless and does not have a phone number.

## 2018-10-27 NOTE — Progress Notes (Signed)
PROGRESS NOTE  Jacob Bass QQI:[id number] DOB: 09/24/1960 DOA: 10/09/2018 PCP: Patient, No Pcp Per  HPI/Recap of past 24 hours:  t max 101.6 2/22 evening, tmax 100 last 24hrs ,  no chest pain, no edema, no hypoxia at rest, denies ab pain, no diarrhea,  Urine output 851cc documented last 24hrs, has condom cath on, urine appear to be clear I did not notice cough today  He is Alert, interactive, speech is improving, left arm remain weak  Still a littler confused, suspect now is close to baseline  he states he is at [facility] long  He refuses to get out bed to chair  Assessment/Plan: Principal Problem:   Anemia, macrocytic Active Problems:   Chest pain   Elevated troponin   EKG abnormalities   Homelessness   Blood pressure elevated without history of HTN   Thrombocytopenia (HCC)   Cerebral thrombosis with cerebral infarction   Cerebral embolism with cerebral infarction   Subarachnoid hemorrhage   Intracerebral hemorrhage   Heme positive stool   Cirrhosis of liver without ascites (HCC)   Gastric ulcer   Fall   Dysphagia   Hypomagnesemia   Chronic obstructive pulmonary disease (HCC)  Fever 101.6 on  2/22 7pm Patient denies symptom, no leukocytosis blood culture culture no growth, urine culture with multiple species , he denies urinary symptom, cxr "Possible mild peribronchial opacities in the bilateral lower lobes."  bladder scan (poor urine output, rule out urinary retention), incentive spirometer Fever could be from hepatitis, will Monitor off abx for now  Addendum: called by radiology to reports abrupt enlargement of his heart compare to cxr obtained a few days ago, stat echo ordered which is unremarkable, cardiology consulted and signed off He is hemodynamically stable, denies chest pain, no sob, no hypoxia, no edema.   Cough/wheezing/likely copd/bronchitis ( noticed on 2/19): -no fever, no chest pain, no hypoxia, h/o smoking (2pack a day) -cxr with mild pleural  effusion on 2/19, though no edema, s/p one dose of iv lasix 40mg  x1 on 2/21 and another dose of 2/24 -improved, no cough, no wheezing since  2/22, continue nebs, mucinex,  -incentive spirometer, out of bed  Stroke:  Multifocal small infarcts including right external capsule, right CR, left cerebellar (presenting symptom) -mri brain on presentation:  1. Limited motion degraded study. 2. 13 mm acute/early subacute infarction within right external capsule and corona radiata. Several additional punctate acute/early subacute infarcts are present in bilateral basal ganglia. 3. 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 Advanced chronic microvascular ischemic changes and moderate volume loss of the brain -, per neurology suspect acute CVA due to "intracranial stenosis in the setting of hypotension and hypoglycemia." -acute cva result in  left facial droop and left ue weakness  -he is improving, slight confusion, now likely baseline, on dysphagia diet, left arm remain weak - awaiting skilled nursing facility placement.  Acute Metabolic encephalopathy: Likely multifactorial in the setting of volume depletion, stroke, alcohol liver cirrhosis and recurrent hypoglycemia.   Patient is back to baseline. Possible baseline vascular dementia  Episodes hypoglycemia: Likely due to decreased oral intake.   Macrocytic anemia: Likely multifactorial in the setting of alcohol possible B12 deficiency and he had an EGD that showed a nonbleeding clean base ulcer. He declined colonoscopy Due to financial reasons and compliance and that the patient will be hard to follow-up, he will be sent home on aspirin and Protonix twice a day.  Gastric biopsies negative for HP or cancer. He does need a follow  up EGD and colonoscopy, need to follow up with GI Dr Adela Lank in a few months.  Mild troponin elevation opn presentation Troponin minimally elevated and flat at 0.04 >> 0.04 >> 0.05 >> 0.05 >> 0.04  >> 0.04. No chest pain,  Echo preserved lvef, with grade II diastolic dysfunction Cardiology consulted, recommend out patient follow up for further cardiovascular risk stratification .  SVT: Short run of SVT on tele, patient is asymptomatic Keep mag>2, K>.4, continue betablocker  Hypokalemia/hypomagnesemia:  remain low, continue to replace k/mag and repeat lab in am  Alcohol abuse/Hepatitis C /  Cirrhosis of liver without ascites (HCC)/thrombocytopenia, needs GI/ infectious disease follow up HIV negative   Status post fall: fall precaution /PT eval  Homeless: Child psychotherapist consulted  Oral thrush:improving on topical nystatin  Bladder scan with 185cc, will get postvoid residual Start flomax, could have slight neurogenic bladder or baseline bph, he denies symptom Increase activity, avoid constipation  Code Status: full  Family Communication: patient   Disposition Plan: will need snf, difficult placement due to homeless issues   Consultants:  Neurology  GI  cardiology  Procedures:  EGD on 2/16  Antibiotics:  none   Objective: BP 125/60 (BP Location: Right Arm)   Pulse 64   Temp 98.6 F (37 C) (Oral)   Resp 20   Ht  (1.6 m)   Wt 68.9 kg   SpO2 100%   BMI 26.93 kg/m   Intake/Output Summary (Last 24 hours) at 10/27/2018 0737 Last data filed at 10/26/2018 2315 Gross per 24 hour  Intake 240 ml  Output 851 ml  Net -611 ml   Filed Weights   10/19/18 0839 10/26/18 0342 10/27/18 0353  Weight: 69.9 kg 69.4 kg 68.9 kg    Exam: Patient is examined daily including today on 10/27/2018, exams remain the same as of yesterday except that has changed    General:  NAD, alert, pleasant, slight confusion, poor dentition, + oral thrush   Cardiovascular: RRR  Respiratory: diminished, no wheezing, no rales , no rhonchi  Abdomen: Soft/ND/NT, positive BS  Musculoskeletal: No Edema  Neuro: alert, interactive, pleasant,  Slight confusion, left arm  weakness  Data Reviewed: Basic Metabolic Panel: Recent Labs  Lab 10/24/18 0459 10/25/18 0602 10/26/18 0544 10/27/18 0537  NA 139 141 138 137  K 3.6 3.9 3.8 3.7  CL 106 106 106 105  CO2 GLUCOSE 97 85 89 122*  BUN CREATININE 0.72 0.76 0.74 0.81  CALCIUM 8.0* 7.9* 7.9* 8.0*  MG 1.6* 1.7 1.6* 1.6*   Liver Function Tests: Recent Labs  Lab 10/24/18 0459  AST 78*  ALT 56*  ALKPHOS 81  BILITOT 0.9  PROT 6.1*  ALBUMIN 2.1*   No results for input(s): LIPASE, AMYLASE in the last 168 hours. No results for input(s): AMMONIA in the last 168 hours. CBC: Recent Labs  Lab 10/24/18 0459 10/27/18 0537  WBC 7.7 6.8  NEUTROABS 4.7 3.7  HGB 9.1* 9.2*  HCT 28.2* 27.8*  MCV 102.5* 100.7*  PLT 157 127*   Cardiac Enzymes:   No results for input(s): CKTOTAL, CKMB, CKMBINDEX, TROPONINI in the last 168 hours. BNP (last 3 results) No results for input(s): BNP in the last 8760 hours.  ProBNP (last 3 results) No results for input(s): PROBNP in the last 8760 hours.  CBG: Recent Labs  Lab 10/26/18 1632 10/26/18 1929 10/26/18 2316 10/27/18 0402 10/27/18 0642  GLUCAP 111* 96 100* 74 92  Recent Results (from the past 240 hour(s))  Culture, blood (routine x 2)     Status: None (Preliminary result)   Collection Time: 10/26/18 10:01 AM  Result Value Ref Range Status   Specimen Description BLOOD LEFT ANTECUBITAL  Final   Special Requests   Final    BOTTLES DRAWN AEROBIC AND ANAEROBIC Blood Culture adequate volume Performed at [facility], [address] [place], [place] [address]    Culture NO GROWTH < 12 HOURS  Final   Report Status PENDING  Incomplete  Culture, blood (routine x 2)     Status: None (Preliminary result)   Collection Time: 10/26/18 10:05 AM  Result Value Ref Range Status   Specimen Description BLOOD LEFT HAND  Final   Special Requests   Final    BOTTLES DRAWN AEROBIC AND ANAEROBIC Blood Culture adequate volume Performed at  [facility], [address], [place], [place] [address]    Culture NO GROWTH < 12 HOURS  Final   Report Status PENDING  Incomplete     Studies: Dg Chest 2 View  Result Date: 10/26/2018 CLINICAL DATA:  Fever. Shortness of breath and cough. Risk of aspiration. EXAM: CHEST - 2 VIEW COMPARISON:  10/22/2018 FINDINGS: There has been interval increase in the size of the cardiac silhouette, which represents a change when compared to the most recent radiograph dated 10/22/2018. There is no evidence of focal airspace consolidation, pleural effusion or pneumothorax. Possible mild peribronchial opacities in the bilateral lower lobes. Osseous structures are without acute abnormality. Soft tissues are grossly normal. IMPRESSION: 1. Interval increase in the size of the cardiac silhouette, which represents a change when compared to the most recent radiograph dated 10/22/2018. Given the rapid change, pericardial effusion is of primary consideration. 2. Possible mild peribronchial opacities in the bilateral lower lobes. Electronically Signed   By: Ted Mcalpine M.D.   On: 10/26/2018 14:28    Scheduled Meds: . amLODipine  2.5 mg Oral Daily  . aspirin  325 mg Oral Daily  . atorvastatin  40 mg Per Tube q1800  . feeding supplement (ENSURE ENLIVE)  237 mL Oral Q1500  . feeding supplement (PRO-STAT SUGAR FREE 64)  30 mL Oral BID  . folic acid  1 mg Oral Daily  . guaiFENesin  600 mg Oral BID  . heparin injection (subcutaneous)  5,000 Units Subcutaneous Q8H  . lisinopril  20 mg Oral Daily  . metoprolol tartrate  25 mg Per Tube BID  . multivitamin with minerals  1 tablet Oral Daily  . nystatin  5 mL Oral QID  . pantoprazole  40 mg Oral BID  . polyethylene glycol powder  1 Container Oral Once  . thiamine  100 mg Oral Daily  . vitamin B-12  1,000 mcg Oral Daily    Continuous Infusions:    Time spent: I have personally reviewed and interpreted on  10/27/2018 daily labs, imagings as  discussed above under date review session and assessment and plans.  I reviewed all nursing notes, pharmacy notes, consultant notes,  vitals, pertinent old records  I have discussed plan of care as described above with RN , patient  on 10/27/2018   [name] MD, PhD  [facility] Pager [phone]. If 7PM-7AM, please contact night-coverage at [email or web address], password [facility] 10/27/2018, 7:37 AM  LOS: 16 days

## 2018-10-27 NOTE — Progress Notes (Signed)
Modified Barium Swallow Progress Note  Patient Details  Name: Jacob Bass MRN: [id number] Date of Birth: 05/15/1961  Today's Date: 10/27/2018  Modified Barium Swallow completed.  Full report located under Chart Review in the Imaging Section.  Brief recommendations include the following:  Clinical Impression  Pt presents with evidence of aspiration on today's study, which appears to be related to increased volumes and rapidity of eating/swallowing as compared to the smaller volumes he consumed on MBS of 2/13.  There is slightly improved attention to left lateral sulcus, although due to impaired sensation and motor control, pt has difficulty maintaining bolus cohesion. Solids and liquids tend to aggregate in the valleculae.  Thin liquids reach the pyriforms, enter a widely open laryngeal vestibule, and are promptly aspirated before the swallow response.  There is adequate laryngeal vestibule closure at the time of the swallow, but it occurs too late. Aspiration is accompanied by a cough response.  Nectar thick liquids do not enter the airway. Pharyngeal contraction is adequate, with no residue post swallow; adequate bolus flow into upper esophagus.  Use of a chin tuck is not consistently effective in protecting airway.  Discussed results with Dr. Roda Shutters and RN.  For now, change diet to dysphagia 1, nectar thick liquids.  Therapeutic intervention to focus on trials of compensatory strategies at bedside, improving timing of swallow readiness.  Discussed with pt, who had difficulty understanding recommendations.  Will continue pt education.   Swallow Evaluation Recommendations       SLP Diet Recommendations: Dysphagia 1 (Puree) solids;Nectar thick liquid   Liquid Administration via: Cup   Medication Administration: Crushed with puree   Supervision: Intermittent supervision to cue for compensatory strategies   Compensations: Small sips/bites;Minimize environmental distractions;Lingual sweep for  clearance of pocketing   Postural Changes: Seated upright at 90 degrees   Oral Care Recommendations: Oral care BID   Other Recommendations: Order thickener from pharmacy  Ade Stmarie L. Samson Frederic, MA CCC/SLP Acute Rehabilitation Services Office number [phone] Pager [phone]   [name] 10/27/2018,10:54 AM

## 2018-10-27 NOTE — Clinical Social Work Placement (Signed)
Nurse to call report to [phone], Room 155B     CLINICAL SOCIAL WORK PLACEMENT  NOTE  Date:  10/27/2018  Patient Details  Name: Jacob Bass MRN: [id number] Date of Birth: August 06, 1961  Clinical Social Work is seeking post-discharge placement for this patient at the Skilled  Nursing Facility level of care (*CSW will initial, date and re-position this form in  chart as items are completed):  Yes   Patient/family provided with [facility] Clinical Social Work Department's list of facilities offering this level of care within the geographic area requested by the patient (or if unable, by the patient's family).  Yes   Patient/family informed of their freedom to choose among providers that offer the needed level of care, that participate in Medicare, Medicaid or managed care program needed by the patient, have an available bed and are willing to accept the patient.  Yes   Patient/family informed of [facility]'s ownership interest in [facility] and [facility], as well as of the fact that they are under no obligation to receive care at these facilities.  PASRR submitted to EDS on       PASRR number received on       Existing PASRR number confirmed on       FL2 transmitted to all facilities in geographic area requested by pt/family on       FL2 transmitted to all facilities within larger geographic area on       Patient informed that his/her managed care company has contracts with or will negotiate with certain facilities, including the following:        Yes   Patient/family informed of bed offers received.  Patient chooses bed at ([facility] )     Physician recommends and patient chooses bed at      Patient to be transferred to ([facility]) on 10/27/18.  Patient to be transferred to facility by [facility]     Patient family notified on 10/27/18 of transfer.  Name of family member notified:  Onalee Hua, friend     PHYSICIAN       Additional Comment:     _______________________________________________ Baldemar Lenis, LCSW 10/27/2018, 3:17 PM

## 2018-10-27 NOTE — Discharge Summary (Addendum)
Discharge Summary  Jacob Bass ZOX:[id number] DOB: 10-03-1960  PCP: Patient, No Pcp Per  Admit date: 10/09/2018 Discharge date: 10/27/2018  Time spent: , more than 50% time spent on coordination of care.  Recommendations for Outpatient Follow-up:  1. F/u with SNF MD for hospital discharge follow up, repeat cbc/bmp at follow up 2. F/u with neurology in four weeks for stroke follow up 3. F/u with GI Dr Adela Lank in 2months for gastric ulcer /cirrhosis in two months, need repeat EGD/colonoscopy 4. F/u with cardiology for CAD risk stratification  5. F/u with infectious disease for hepc  Discharge Diagnoses:  Active Hospital Problems   Diagnosis Date Noted  . Anemia, macrocytic 10/09/2018  . Hypokalemia   . Hypomagnesemia   . Chronic obstructive pulmonary disease (HCC)   . Dysphagia   . Fall   . Gastric ulcer   . Cerebral thrombosis with cerebral infarction 10/13/2018  . Cerebral embolism with cerebral infarction 10/13/2018  . Subarachnoid hemorrhage 10/13/2018  . Intracerebral hemorrhage 10/13/2018  . Heme positive stool   . Cirrhosis of liver without ascites (HCC)   . Chest pain 10/09/2018  . Elevated troponin 10/09/2018  . EKG abnormalities 10/09/2018  . Homelessness 10/09/2018  . Blood pressure elevated without history of HTN 10/09/2018  . Thrombocytopenia (HCC) 10/09/2018    Resolved Hospital Problems  No resolved problems to display.    Discharge Condition: stable  Diet recommendation:   SLP Diet Recommendations: Dysphagia 1 (Puree) solids;Nectar thick liquid   Liquid Administration via: Cup   Medication Administration: Crushed with puree   Supervision: Intermittent supervision to cue for compensatory strategies   Compensations: Small sips/bites;Minimize environmental distractions;Lingual sweep for clearance of pocketing   Postural Changes: Seated upright at 90 degrees   Oral Care Recommendations: Oral care BID   Filed Weights   10/19/18  0839 10/26/18 0342 10/27/18 0353  Weight: 69.9 kg 69.4 kg 68.9 kg    History of present illness: (per admitting MD Dr Mikeal Hawthorne) PCP: Patient, No Pcp Per   Outpatient Specialists: None  Patient coming from: Homeless  Chief Complaint: Fall  HPI: Jacob Bass is a 58 y.o. male with medical history significant of hypertension and homelessness who was brought in by EMS after a bystander noted patient to have a fall thought to be mechanical.  Patient was seen and evaluated and found to have abnormal EKG as well as elevated troponin.  Patient has history of hypertension but no known coronary artery disease.  Drug screen showed no evidence of drug abuse.  He is being admitted with suspected acute coronary syndrome.  Cardiology has also been consulted.  No active chest pain now.  Nitroglycerin has been given.  No cough or shortness of breath.  No nausea vomiting or diaphoresis..  ED Course: Temperature is 98.6 blood pressure 152/67 pulse 95.  Respiratory respiratory 26 oxygen sat 100% room air.  White count is 9.6 with hemoglobin 10.3 and platelets of 100.  Chemistry showed a BUN of 34 and creatinine 0.99.  Calcium 8.3 and troponin 0 0.052.  His glucose is 96.  Chest x-ray showed no active findings.  Patient is being admitted to medical service with cardiology consultation.   Hospital Course:  Principal Problem:   Anemia, macrocytic Active Problems:   Chest pain   Elevated troponin   EKG abnormalities   Homelessness   Blood pressure elevated without history of HTN   Thrombocytopenia (HCC)   Cerebral thrombosis with cerebral infarction   Cerebral embolism with cerebral infarction  Subarachnoid hemorrhage   Intracerebral hemorrhage   Heme positive stool   Cirrhosis of liver without ascites (HCC)   Gastric ulcer   Fall   Dysphagia   Hypomagnesemia   Chronic obstructive pulmonary disease (HCC)   Hypokalemia   Stroke: Multifocal small infarcts including right external capsule,  right CR, left cerebellar (presenting symptom) -mri brain on presentation:  1. Limited motion degraded study. 2. 13 mm acute/early subacute infarction within right external capsule and corona radiata. Several additional punctate acute/early subacute infarcts are present in bilateral basal ganglia. 3. 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 Advanced chronic microvascular ischemic changes and moderate volume loss of the brain -, per neurology suspect acute CVA due to "intracranial stenosis in the setting of hypotension and hypoglycemia." -acute cva result in  left facial droop and left ue weakness  -he is improving, slight confusion, now likely baseline, on dysphagia diet, left arm remain weak - skilled nursing facility placement. F/u with neurology  Acute Metabolic encephalopathy: Likely multifactorial in the setting of volume depletion, stroke, alcohol liver cirrhosis and recurrent hypoglycemia.  Patient is back to baseline. Possible baseline vascular dementia with mild confusion at baseline F/u with neurology  Episodes hypoglycemia: Likely due to decreased oral intake. improved, now that oral intake is improving  Macrocytic anemia: Likely multifactorial in the setting of alcohol possible B12 deficiency and he had an EGD that showed a nonbleeding clean base ulcer. He declined colonoscopy Due to financial reasons and compliance and that the patient will be hard to follow-up, he is stared on aspirin and Protonix twice a day.  Gastric biopsies negative for HP or cancer. He does need a follow up EGD and colonoscopy, need to follow up with GI Dr Adela LankArmbruster in a few months.  Mild troponin elevation on presentation Troponin minimally elevated and flat at 0.04 >> 0.04 >> 0.05 >> 0.05 >> 0.04 >> 0.04. No chest pain,  Echo preserved lvef, with grade II diastolic dysfunction Cardiology consulted, recommend out patient follow up for further cardiovascular risk  stratification .  SVT: Short run of SVT on telex1 , patient is asymptomatic Keep mag>2, K>4, continue betablocker  Hypokalemia/hypomagnesemia:  replaced, snf MD to repeat labs at follow up  Alcohol abuse/Hepatitis C / Cirrhosis of liver without ascites (HCC)/thrombocytopenia, needs GI/ infectious disease follow up HIV negative   Status post fall: fall precaution /PT eval  Homeless: Child psychotherapistsocial worker consulted, snf placement  Oral thrush:improving on topical nystatin    Fever x1, t max 101.6 on  2/22 7pm, no fever since Patient denies symptom, no leukocytosis blood culture culture no growth, urine culture with multiple species , he denies urinary symptom,  cxr "Possible mild peribronchial opacities in the bilateral lower lobes."  incentive spirometer, increase activity  Fever could be from hepatitis, will Monitor off abx for now  Addendum: called by radiology to reports abrupt enlargement of his heart compare to cxr obtained a few days ago, stat echo ordered which is unremarkable, cardiology consulted and signed off He is hemodynamically stable, denies chest pain, no sob, no hypoxia, no edema.    postvoid residual 120cc, he denies urinary symptom,  Start flomax, could have neurogenic bladder or baseline bph, Increase activity, avoid constipation   Cough/wheezing/likely copd/bronchitis ( noticed on 2/19, resolved since 2/22): -no fever, no chest pain, no hypoxia, h/o smoking (2pack a day) -cxr with mild pleural effusion on 2/19, though no edema, s/p one dose of iv lasix 40mg  x1 on 2/21 and another dose of 2/24 -  improved, no cough, no wheezing since  2/22, continue nebs, mucinex,  -incentive spirometer, out of bed -snf placement   Code Status: full  Family Communication: patient   Disposition Plan: SNF   Consultants:  Neurology  GI  cardiology  Procedures:  EGD on 2/16  Antibiotics:  none  Discharge Exam: BP (!) 117/57 (BP Location:  Right Arm)   Pulse (!) 56   Temp 98.7 F (37.1 C) (Oral)   Resp 16   Ht 5\' 3"  (1.6 m)   Wt 68.9 kg   SpO2 96%   BMI 26.93 kg/m     General:  NAD, alert, pleasant, slight confusion, poor dentition,  oral thrush is improving  Cardiovascular: RRR  Respiratory: diminished, no wheezing, no rales , no rhonchi  Abdomen: Soft/ND/NT, positive BS  Musculoskeletal: No Edema  Neuro: alert, interactive, pleasant,  Slight confusion, left arm weakness    Discharge Instructions You were cared for by a hospitalist during your hospital stay. If you have any questions about your discharge medications or the care you received while you were in the hospital after you are discharged, you can call the unit and asked to speak with the hospitalist on call if the hospitalist that took care of you is not available. Once you are discharged, your primary care physician will handle any further medical issues. Please note that NO REFILLS for any discharge medications will be authorized once you are discharged, as it is imperative that you return to your primary care physician (or establish a relationship with a primary care physician if you do not have one) for your aftercare needs so that they can reassess your need for medications and monitor your lab values.  Discharge Instructions    Ambulatory referral to Neurology   Complete by:  As directed    Follow up with stroke clinic NP (Jessica Vanschaick or Darrol Angel, if both not available, consider Dr. Delia Heady, Dr. Jamelle Rushing, or Dr. Naomie Dean) at [facility] in about 4 weeks.   Diet - low sodium heart healthy   Complete by:  As directed    Diet general   Complete by:  As directed    SLP Diet Recommendations: Dysphagia 1 (Puree) solids;Nectar thick liquid  Liquid Administration via: Cup  Medication Administration: Crushed with puree  Supervision: Intermittent supervision to cue for compensatory strategies   Compensations: Small sips/bites;Minimize environmental distractions;Lingual sweep for clearance of pocketing  Postural Changes: Seated upright at 90 degrees  Oral Care Recommendations: Oral care BID   Increase activity slowly   Complete by:  As directed    Increase activity slowly   Complete by:  As directed      Allergies as of 10/27/2018      Reactions   Penicillins Hives      Medication List    TAKE these medications   amLODipine 2.5 MG tablet Commonly known as:  NORVASC Take 1 tablet (2.5 mg total) by mouth daily.   aspirin 325 MG tablet Take 1 tablet (325 mg total) by mouth daily. Start taking on:  October 28, 2018   atorvastatin 40 MG tablet Commonly known as:  LIPITOR Place 1 tablet (40 mg total) into feeding tube daily at 6 PM.   cyanocobalamin 1000 MCG tablet Take 1 tablet (1,000 mcg total) by mouth daily. Start taking on:  October 28, 2018   feeding supplement (ENSURE ENLIVE) Liqd Take 237 mLs by mouth daily at 3 pm. Start taking on:  October 28, 2018  folic acid 1 MG tablet Commonly known as:  FOLVITE Take 1 tablet (1 mg total) by mouth daily. Start taking on:  October 28, 2018   guaiFENesin 600 MG 12 hr tablet Commonly known as:  MUCINEX Take 1 tablet (600 mg total) by mouth 2 (two) times daily.   ipratropium-albuterol 0.5-2.5 (3) MG/3ML Soln Commonly known as:  DUONEB Take 3 mLs by nebulization every 4 (four) hours as needed.   lisinopril 5 MG tablet Commonly known as:  PRINIVIL,ZESTRIL Take 1 tablet (5 mg total) by mouth daily. What changed:    medication strength  how much to take   metoprolol tartrate 25 MG tablet Commonly known as:  LOPRESSOR Take 1 tablet (25 mg total) by mouth 2 (two) times daily.   pantoprazole 40 MG tablet Commonly known as:  PROTONIX Take 1 tablet (40 mg total) by mouth 2 (two) times daily.   tamsulosin 0.4 MG Caps capsule Commonly known as:  FLOMAX Take 1 capsule (0.4 mg total) by mouth daily after  supper.      Allergies  Allergen Reactions  . Penicillins Hives   Follow-up Information    [facility] Follow up in 4 week(s).   Specialty:  Neurology Why:  stroke clinic. office will call with appt date and time Contact information: [address] [place] [address] [phone]       [facility]              Follow up.   Why:  hepatitis c Contact information: [address] [place] [address]       Benancio Deeds, MD Follow up in 2 month(s).   Specialty:  Gastroenterology Why:  gastric ulcer and cirrhosis  need repeat egd/colonoscopy Contact information: [address] [place] [address] [phone]        Jodelle Gross, NP Follow up in 1 month(s).   Specialties:  Nurse Practitioner, Radiology, Cardiology Why:  for cardiac risk stratification  Contact information: [address] [place] [address] [phone]            The results of significant diagnostics from this hospitalization (including imaging, microbiology, ancillary and laboratory) are listed below for reference.    Significant Diagnostic Studies: Ct Angio Head W Or Wo Contrast  Result Date: 10/13/2018 CLINICAL DATA:  58 y/o  M; stroke for follow-up. EXAM: CT ANGIOGRAPHY HEAD AND NECK TECHNIQUE: Multidetector CT imaging of the head and neck was performed using the standard protocol during bolus administration of intravenous contrast. Multiplanar CT image reconstructions and MIPs were obtained to evaluate the vascular anatomy. Carotid stenosis measurements (when applicable) are obtained utilizing NASCET criteria, using the distal internal carotid diameter as the denominator. CONTRAST:  75mL ISOVUE-370 IOPAMIDOL (ISOVUE-370) INJECTION 76% COMPARISON:  10/12/2018 CT head. 10/13/2018 MRI head. FINDINGS: CT HEAD FINDINGS Brain: Stable hypodensity within the right  external capsule and corona radiata corresponding to infarction on prior MRI of the brain. Additional punctate infarcts are poorly visualized on CT. Stable chronic infarct in the right anterior basal ganglia. Stable advanced chronic microvascular ischemic changes of white matter and moderate volume loss of the brain. No new stroke, hemorrhage, mass effect, extra-axial collection, or herniation identified. Vascular: As below. Skull: Normal. Negative for fracture or focal lesion. Sinuses: Left mastoid tip opacification. Additional visible paranasal sinuses and the mastoid air cells are normally aerated. Orbits: No acute finding. Review of the MIP images  confirms the above findings CTA NECK FINDINGS Aortic arch: Bovine very branching. Imaged portion shows no evidence of aneurysm or dissection. No significant stenosis of the major arch vessel origins. Moderate mixed plaque of the aortic arch. Right carotid system: No evidence of dissection, stenosis (50% or greater) or occlusion. Mixed plaque of the carotid bifurcation with mild less than 50% proximal ICA stenosis. Left carotid system: No evidence of dissection, stenosis (50% or greater) or occlusion. Vertebral arteries: Right vertebral artery segment of fibrofatty plaque with mild to moderate 50% stenosis just downstream to the vertebral body origin. No additional segment of hemodynamically significant stenosis, aneurysm, occlusion, or dissection. Skeleton: C3-4 and C4-5 grade 1 anterolisthesis. Mild spondylosis of the cervical spine. No high-grade bony spinal canal stenosis. No acute osseous abnormality is evident. Other neck: Negative. Upper chest: Negative. Review of the MIP images confirms the above findings CTA HEAD FINDINGS Anterior circulation: Bilateral carotid siphon calcified plaque. Mild less than 50% distal left cavernous ICA stenosis. Left A2 mild stenosis. No large vessel occlusion, aneurysm, vascular malformation, or additional segment of significant  stenosis. Posterior circulation: No significant stenosis, proximal occlusion, aneurysm, or vascular malformation. Mild mid basilar stenosis. Mild bilateral P1 stenosis. Venous sinuses: As permitted by contrast timing, patent. Anatomic variants: Large right A1, large anterior communicating artery, diminutive left A1, normal variant. Associated asymmetric small left-sided left carotid system perfusing the left MCA distribution. Delayed phase: No abnormal intracranial enhancement. Review of the MIP images confirms the above findings IMPRESSION: CT head: 1. Stable hypodensity within the right external capsule and corona radiata corresponding to infarction on prior MRI of the brain. Additional punctate infarcts are poorly visualized on CT. No new acute intracranial abnormality identified. 2. Stable chronic infarct in the right anterior basal ganglia. Stable advanced chronic microvascular ischemic changes of white matter and moderate volume loss of the brain. CTA NECK:. 1. Patent carotid and vertebral arteries. No high-grade stenosis by NASCET criteria, dissection, or aneurysm. 2. Right proximal vertebral artery mild-to-moderate 50% stenosis with fibrofatty plaque. 3. Right proximal ICA mild less than 50% stenosis with mixed plaque. CTA HEAD:. 1. Patent Circle of Willis. No large vessel occlusion, aneurysm, or vascular malformation identified. 2. Intracranial atherosclerosis with multiple segments of mild stenosis in the anterior and posterior circulation. Electronically Signed   By: Mitzi Hansen M.D.   On: 10/13/2018 22:46   Dg Chest 2 View  Result Date: 10/26/2018 CLINICAL DATA:  Fever. Shortness of breath and cough. Risk of aspiration. EXAM: CHEST - 2 VIEW COMPARISON:  10/22/2018 FINDINGS: There has been interval increase in the size of the cardiac silhouette, which represents a change when compared to the most recent radiograph dated 10/22/2018. There is no evidence of focal airspace consolidation,  pleural effusion or pneumothorax. Possible mild peribronchial opacities in the bilateral lower lobes. Osseous structures are without acute abnormality. Soft tissues are grossly normal. IMPRESSION: 1. Interval increase in the size of the cardiac silhouette, which represents a change when compared to the most recent radiograph dated 10/22/2018. Given the rapid change, pericardial effusion is of primary consideration. 2. Possible mild peribronchial opacities in the bilateral lower lobes. Electronically Signed   By: Ted Mcalpine M.D.   On: 10/26/2018 14:28   Dg Chest 2 View  Result Date: 10/22/2018 CLINICAL DATA:  Dry cough for 1 week EXAM: CHEST - 2 VIEW COMPARISON:  10/13/2018 FINDINGS: Normal heart size. Clear lungs. Small bilateral pleural effusions. No pneumothorax. IMPRESSION: Small bilateral pleural effusion. Electronically Signed   By: Merton Border  Hoss M.D.   On: 10/22/2018 17:21   Dg Chest 2 View  Result Date: 10/13/2018 CLINICAL DATA:  Aspiration pneumonia EXAM: CHEST - 2 VIEW COMPARISON:  10/09/2018, 05/17/2006 FINDINGS: The heart size and mediastinal contours are within normal limits. Aortic atherosclerosis. Both lungs are clear. The visualized skeletal structures are unremarkable. IMPRESSION: No active cardiopulmonary disease. Electronically Signed   By: Jasmine Pang M.D.   On: 10/13/2018 22:14   Dg Chest 2 View  Result Date: 10/09/2018 CLINICAL DATA:  Chest pain, fall today.  History of CHF. EXAM: CHEST - 2 VIEW COMPARISON:  Chest radiograph May 17, 2016 FINDINGS: Cardiac silhouette is upper limits of normal size. Mild pulmonary vascular congestion without pleural effusion or focal consolidation. Biapical pleural thickening. No pneumothorax. Upper thoracic dextroscoliosis. IMPRESSION: Borderline cardiomegaly and mild pulmonary vascular congestion. Electronically Signed   By: Awilda Metro M.D.   On: 10/09/2018 15:33   Dg Abd 1 View  Result Date: 10/14/2018 CLINICAL DATA:   Feeding tube placement. EXAM: ABDOMEN - 1 VIEW COMPARISON:  None. FINDINGS: The long GI feeding tube tip is in the third portion of the duodenum. IMPRESSION: Feeding tube tip is in the third portion of the duodenum. Electronically Signed   By: Rudie Meyer M.D.   On: 10/14/2018 17:30   Ct Angio Neck W Or Wo Contrast  Result Date: 10/13/2018 CLINICAL DATA:  58 y/o  M; stroke for follow-up. EXAM: CT ANGIOGRAPHY HEAD AND NECK TECHNIQUE: Multidetector CT imaging of the head and neck was performed using the standard protocol during bolus administration of intravenous contrast. Multiplanar CT image reconstructions and MIPs were obtained to evaluate the vascular anatomy. Carotid stenosis measurements (when applicable) are obtained utilizing NASCET criteria, using the distal internal carotid diameter as the denominator. CONTRAST:  75mL ISOVUE-370 IOPAMIDOL (ISOVUE-370) INJECTION 76% COMPARISON:  10/12/2018 CT head. 10/13/2018 MRI head. FINDINGS: CT HEAD FINDINGS Brain: Stable hypodensity within the right external capsule and corona radiata corresponding to infarction on prior MRI of the brain. Additional punctate infarcts are poorly visualized on CT. Stable chronic infarct in the right anterior basal ganglia. Stable advanced chronic microvascular ischemic changes of white matter and moderate volume loss of the brain. No new stroke, hemorrhage, mass effect, extra-axial collection, or herniation identified. Vascular: As below. Skull: Normal. Negative for fracture or focal lesion. Sinuses: Left mastoid tip opacification. Additional visible paranasal sinuses and the mastoid air cells are normally aerated. Orbits: No acute finding. Review of the MIP images confirms the above findings CTA NECK FINDINGS Aortic arch: Bovine very branching. Imaged portion shows no evidence of aneurysm or dissection. No significant stenosis of the major arch vessel origins. Moderate mixed plaque of the aortic arch. Right carotid system: No  evidence of dissection, stenosis (50% or greater) or occlusion. Mixed plaque of the carotid bifurcation with mild less than 50% proximal ICA stenosis. Left carotid system: No evidence of dissection, stenosis (50% or greater) or occlusion. Vertebral arteries: Right vertebral artery segment of fibrofatty plaque with mild to moderate 50% stenosis just downstream to the vertebral body origin. No additional segment of hemodynamically significant stenosis, aneurysm, occlusion, or dissection. Skeleton: C3-4 and C4-5 grade 1 anterolisthesis. Mild spondylosis of the cervical spine. No high-grade bony spinal canal stenosis. No acute osseous abnormality is evident. Other neck: Negative. Upper chest: Negative. Review of the MIP images confirms the above findings CTA HEAD FINDINGS Anterior circulation: Bilateral carotid siphon calcified plaque. Mild less than 50% distal left cavernous ICA stenosis. Left A2 mild stenosis. No large  vessel occlusion, aneurysm, vascular malformation, or additional segment of significant stenosis. Posterior circulation: No significant stenosis, proximal occlusion, aneurysm, or vascular malformation. Mild mid basilar stenosis. Mild bilateral P1 stenosis. Venous sinuses: As permitted by contrast timing, patent. Anatomic variants: Large right A1, large anterior communicating artery, diminutive left A1, normal variant. Associated asymmetric small left-sided left carotid system perfusing the left MCA distribution. Delayed phase: No abnormal intracranial enhancement. Review of the MIP images confirms the above findings IMPRESSION: CT head: 1. Stable hypodensity within the right external capsule and corona radiata corresponding to infarction on prior MRI of the brain. Additional punctate infarcts are poorly visualized on CT. No new acute intracranial abnormality identified. 2. Stable chronic infarct in the right anterior basal ganglia. Stable advanced chronic microvascular ischemic changes of white matter  and moderate volume loss of the brain. CTA NECK:. 1. Patent carotid and vertebral arteries. No high-grade stenosis by NASCET criteria, dissection, or aneurysm. 2. Right proximal vertebral artery mild-to-moderate 50% stenosis with fibrofatty plaque. 3. Right proximal ICA mild less than 50% stenosis with mixed plaque. CTA HEAD:. 1. Patent Circle of Willis. No large vessel occlusion, aneurysm, or vascular malformation identified. 2. Intracranial atherosclerosis with multiple segments of mild stenosis in the anterior and posterior circulation. Electronically Signed   By: Mitzi Hansen M.D.   On: 10/13/2018 22:46   Mr Brain Wo Contrast  Result Date: 10/13/2018 CLINICAL DATA:  58 y/o M; altered level of consciousness, stroke for follow-up. EXAM: MRI HEAD WITHOUT CONTRAST TECHNIQUE: Axial DWI, coronal DWI, sagittal T1, axial T2 blade sequences were acquired. The patient was unable to continue and additional sequences were not acquired. COMPARISON:  10/12/2018 CT head FINDINGS: 13 mm focus of reduced diffusion within the right external capsule and corona radiata. Several additional punctate foci of reduced diffusion are present within the lentiform nuclei bilaterally. Findings are consistent with acute/early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

## 2018-10-31 LAB — CULTURE, BLOOD (ROUTINE X 2)
Culture: NO GROWTH
Culture: NO GROWTH
SPECIAL REQUESTS: ADEQUATE
SPECIAL REQUESTS: ADEQUATE

## 2018-11-04 ENCOUNTER — Encounter: Payer: Self-pay | Admitting: Gastroenterology

## 2018-11-19 ENCOUNTER — Telehealth: Payer: Self-pay

## 2018-11-19 NOTE — Telephone Encounter (Signed)
UNABLE TO LM ON HOLD FOR >5 MIN. WILL CB TOMORROW

## 2018-11-20 NOTE — Telephone Encounter (Signed)
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 Have you been in contact with someone that was recently sick with fever/cough or confirmed to have the Covid19 virus?  no  *Contact with a confirmed case should stay at home, away from confirmed patient, monitor symptoms, and reach out to PCP for e-visit/additional testing.  2. Do you have any of the following symptoms [cough, fever (100.4 or greater)], and/or shortness of breath)?  no  *ALL PTS W/ FEVER SHOULD BE REFERRED TO PCP FOR E-VISIT* _________________________________________________  Cardiac Questionnaire:    Since your last visit or hospitalization:    1. Have you been having chest pain? no   2. Have you been having shortness of breath? no   3. Have you been having increasing edema, wt gain, or increase in abdominal girth (pants fitting more tightly)? no   4. Have you had any passing out spells? no    *A YES to any of these questions would result in the appointment being kept.  *If all the answers to these questions are NO, we should indicate that given the current situation regarding the worldwide coronarvirus pandemic, at the recommendation of the CDC, we are looking to limit gatherings in our waiting area, and thus will reschedule their appointment beyond four weeks from today.

## 2018-11-20 NOTE — Telephone Encounter (Signed)
TRIED TO CALL-PHONE JUST KEEPS RINGING

## 2018-11-25 ENCOUNTER — Ambulatory Visit: Payer: Self-pay | Admitting: Adult Health

## 2018-12-04 ENCOUNTER — Ambulatory Visit: Payer: Self-pay | Admitting: Gastroenterology

## 2018-12-05 ENCOUNTER — Inpatient Hospital Stay: Payer: Self-pay | Admitting: Adult Health

## 2018-12-22 NOTE — Telephone Encounter (Signed)
Tried to call pt on hold >34min will CB later

## 2018-12-25 ENCOUNTER — Telehealth: Payer: Self-pay | Admitting: Internal Medicine

## 2018-12-25 NOTE — Telephone Encounter (Signed)
Per charge nurse [place] they have done phone visits for this patient- if you ask for her she will take the phone to him , she will have his medication and vitals ready when you call in , the patient is in medical facility.  Sent consent through Mychart- received verbal consent from Charge Nurse as well      Virtual Visit Pre-Appointment Phone Call  "(Name), I am calling you today to discuss your upcoming appointment. We are currently trying to limit exposure to the virus that causes COVID-19 by seeing patients at home rather than in the office."  1. "What is the BEST phone number to call the day of the visit?" - include this in appointment notes  2. Do you have or have access to (through a family member/friend) a smartphone with video capability that we can use for your visit?" a. If yes - list this number in appt notes as cell (if different from BEST phone #) and list the appointment type as a VIDEO visit in appointment notes b. If no - list the appointment type as a PHONE visit in appointment notes  3. Confirm consent - "In the setting of the current Covid19 crisis, you are scheduled for a (phone or video) visit with your provider on (date) at (time).  Just as we do with many in-office visits, in order for you to participate in this visit, we must obtain consent.  If you'd like, I can send this to your mychart (if signed up) or email for you to review.  Otherwise, I can obtain your verbal consent now.  All virtual visits are billed to your insurance company just like a normal visit would be.  By agreeing to a virtual visit, we'd like you to understand that the technology does not allow for your provider to perform an examination, and thus may limit your provider's ability to fully assess your condition. If your provider identifies any concerns that need to be evaluated in person, we will make arrangements to do so.  Finally, though the technology is pretty good, we cannot assure that it will  always work on either your or our end, and in the setting of a video visit, we may have to convert it to a phone-only visit.  In either situation, we cannot ensure that we have a secure connection.  Are you willing to proceed?" STAFF: Did the patient verbally acknowledge consent to telehealth visit? Document YES/NO here: yes - per charge nurse [place]  4. Advise patient to be prepared - "Two hours prior to your appointment, go ahead and check your blood pressure, pulse, oxygen saturation, and your weight (if you have the equipment to check those) and write them all down. When your visit starts, your provider will ask you for this information. If you have an Apple Watch or Kardia device, please plan to have heart rate information ready on the day of your appointment. Please have a pen and paper handy nearby the day of the visit as well."  5. Give patient instructions for MyChart download to smartphone OR Doximity/Doxy.me as below if video visit (depending on what platform provider is using)  6. Inform patient they will receive a phone call 15 minutes prior to their appointment time (may be from unknown caller ID) so they should be prepared to answer    TELEPHONE CALL NOTE  Megan Brindley has been deemed a candidate for a follow-up tele-health visit to limit community exposure during the Covid-19 pandemic. I spoke with  the patient via phone to ensure availability of phone/video source, confirm preferred email & phone number, and discuss instructions and expectations.  I reminded Jacob Bass to be prepared with any vital sign and/or heart rhythm information that could potentially be obtained via home monitoring, at the time of his visit. I reminded Hever Orosz to expect a phone call prior to his visit.  Berle Mull 12/25/2018 1:43 PM   INSTRUCTIONS FOR DOWNLOADING THE MYCHART APP TO SMARTPHONE  - The patient must first make sure to have activated MyChart and know their login information - If  Apple, go to [employer] and type in MyChart in the search bar and download the app. If Android, ask patient to go to [employer] and type in [place] in the search bar and download the app. The app is free but as with any other app downloads, their phone may require them to verify saved payment information or Apple/Android password.  - The patient will need to then log into the app with their MyChart username and password, and select [facility] as their healthcare provider to link the account. When it is time for your visit, go to the MyChart app, find appointments, and click Begin Video Visit. Be sure to Select Allow for your device to access the Microphone and Camera for your visit. You will then be connected, and your provider will be with you shortly.  **If they have any issues connecting, or need assistance please contact MyChart service desk [phone])**  **If using a computer, in order to ensure the best quality for their visit they will need to use either of the following Internet Browsers: [employer] or Google Chrome**  IF USING DOXIMITY or DOXY.ME - The patient will receive a link just prior to their visit by text.     FULL LENGTH CONSENT FOR TELE-HEALTH VISIT   I hereby voluntarily request, consent and authorize [facility] and its employed or contracted physicians, physician assistants, nurse practitioners or other licensed health care professionals (the Practitioner), to provide me with telemedicine health care services (the Services") as deemed necessary by the treating Practitioner. I acknowledge and consent to receive the Services by the Practitioner via telemedicine. I understand that the telemedicine visit will involve communicating with the Practitioner through live audiovisual communication technology and the disclosure of certain medical information by electronic transmission. I acknowledge that I have been given the opportunity to request an  in-person assessment or other available alternative prior to the telemedicine visit and am voluntarily participating in the telemedicine visit.  I understand that I have the right to withhold or withdraw my consent to the use of telemedicine in the course of my care at any time, without affecting my right to future care or treatment, and that the Practitioner or I may terminate the telemedicine visit at any time. I understand that I have the right to inspect all information obtained and/or recorded in the course of the telemedicine visit and may receive copies of available information for a reasonable fee.  I understand that some of the potential risks of receiving the Services via telemedicine include:   Delay or interruption in medical evaluation due to technological equipment failure or disruption;  Information transmitted may not be sufficient (e.g. poor resolution of images) to allow for appropriate medical decision making by the Practitioner; and/or   In rare instances, security protocols could fail, causing a breach of personal health information.  Furthermore, I acknowledge that it is my  responsibility to provide information about my medical history, conditions and care that is complete and accurate to the best of my ability. I acknowledge that Practitioner's advice, recommendations, and/or decision may be based on factors not within their control, such as incomplete or inaccurate data provided by me or distortions of diagnostic images or specimens that may result from electronic transmissions. I understand that the practice of medicine is not an exact science and that Practitioner makes no warranties or guarantees regarding treatment outcomes. I acknowledge that I will receive a copy of this consent concurrently upon execution via email to the email address I last provided but may also request a printed copy by calling the office of [facility].    I understand that my insurance will be  billed for this visit.   I have read or had this consent read to me.  I understand the contents of this consent, which adequately explains the benefits and risks of the Services being provided via telemedicine.   I have been provided ample opportunity to ask questions regarding this consent and the Services and have had my questions answered to my satisfaction.  I give my informed consent for the services to be provided through the use of telemedicine in my medical care  By participating in this telemedicine visit I agree to the above.

## 2018-12-26 ENCOUNTER — Telehealth (INDEPENDENT_AMBULATORY_CARE_PROVIDER_SITE_OTHER): Payer: Medicaid Other | Admitting: Internal Medicine

## 2018-12-26 VITALS — BP 120/65 | HR 72 | Temp 97.1°F | Ht 63.0 in | Wt 135.6 lb

## 2018-12-26 DIAGNOSIS — R778 Other specified abnormalities of plasma proteins: Secondary | ICD-10-CM

## 2018-12-26 DIAGNOSIS — I10 Essential (primary) hypertension: Secondary | ICD-10-CM

## 2018-12-26 DIAGNOSIS — R7989 Other specified abnormal findings of blood chemistry: Secondary | ICD-10-CM

## 2018-12-26 NOTE — Patient Instructions (Signed)
Medication Instructions:  Your physician recommends that you continue on your current medications as directed. Please refer to the Current Medication list given to you today.  If you need a refill on your cardiac medications before your next appointment, please call your pharmacy.    Follow-Up: At [facility], you and your health needs are our priority.  As part of our continuing mission to provide you with exceptional heart care, we have created designated Provider Care Teams.  These Care Teams include your primary Cardiologist (physician) and Advanced Practice Providers (APPs -  Physician Assistants and Nurse Practitioners) who all work together to provide you with the care you need, when you need it. . No follow-up needed at this time.  Any Other Special Instructions Will Be Listed Below (If Applicable). None

## 2019-01-26 NOTE — Progress Notes (Signed)
Virtual Visit via Telephone Note   This visit type was conducted due to national recommendations for restrictions regarding the COVID-19 Pandemic (e.g. social distancing) in an effort to limit this patient's exposure and mitigate transmission in our community.  Due to his co-morbid illnesses, this patient is at least at moderate risk for complications without adequate follow up.  This format is felt to be most appropriate for this patient at this time.  The patient did not have access to video technology/had technical difficulties with video requiring transitioning to audio format only (telephone).  All issues noted in this document were discussed and addressed.  No physical exam could be performed with this format.  Please refer to the patient's chart for his  consent [facility].   Date:  12/26/2018    ID:  Jacob Bass, DOB 07/09/1961, MRN [id number]  Patient Location: Skilled Nursing Facility Provider Location: Home  PCP:  Patient, No Pcp Per  Cardiologist:  Parke PoissonGayatri A Charita Lindenberger, MD  Electrophysiologist:  None   Evaluation Performed:  Follow-Up Visit  Chief Complaint:  Follow up  History of Present Illness:    Jacob Bass is a 58 y.o. male with a history of hypertension and homelessness on whom a cardiology consult was performed in February for a mildly elevated troponin in the setting of a fall. The patient had no chest pain and had a normal echocardiogram. Cardiology was reconsulted later in February for question of pericardial effusion, but again echocardiogram was unremarkable.   He is feeling well today and has no complaints. The patient denies chest pain, chest pressure, dyspnea at rest or with exertion, palpitations, PND, orthopnea, or leg swelling. Denies syncope or presyncope. Denies dizziness or lightheadedness. Denies snoring and has not been evaluated for sleep apnea.  The patient does not have symptoms concerning for COVID-19 infection (fever, chills,  cough, or new shortness of breath).    Past Medical History:  Diagnosis Date  . CHF (congestive heart failure) (HCC)   . Hypertension    Past Surgical History:  Procedure Laterality Date  . BIOPSY  10/19/2018   Procedure: BIOPSY;  Surgeon: Benancio DeedsArmbruster, Steven P, MD;  [facility];  Service: Gastroenterology;;  . ESOPHAGOGASTRODUODENOSCOPY (EGD) WITH PROPOFOL N/A 10/19/2018   Procedure: ESOPHAGOGASTRODUODENOSCOPY (EGD);  Surgeon: Benancio DeedsArmbruster, Steven P, MD;  [facility];  Service: Gastroenterology;  Laterality: N/A;     Current Meds  Medication Sig  . amLODipine (NORVASC) 2.5 MG tablet Take 1 tablet (2.5 mg total) by mouth daily.  Marland Kitchen. aspirin 325 MG tablet Take 1 tablet (325 mg total) by mouth daily.  Marland Kitchen. atorvastatin (LIPITOR) 40 MG tablet Place 1 tablet (40 mg total) into feeding tube daily at 6 PM.  . feeding supplement, ENSURE ENLIVE, (ENSURE ENLIVE) LIQD Take 237 mLs by mouth daily at 3 pm.  . folic acid (FOLVITE) 1 MG tablet Take 1 tablet (1 mg total) by mouth daily.  Marland Kitchen. guaiFENesin (MUCINEX) 600 MG 12 hr tablet Take 1 tablet (600 mg total) by mouth 2 (two) times daily.  Marland Kitchen. ipratropium-albuterol (DUONEB) 0.5-2.5 (3) MG/3ML SOLN Take 3 mLs by nebulization every 4 (four) hours as needed.  Marland Kitchen. lisinopril (PRINIVIL,ZESTRIL) 5 MG tablet Take 1 tablet (5 mg total) by mouth daily.  . metoprolol tartrate (LOPRESSOR) 25 MG tablet Take 1 tablet (25 mg total) by mouth 2 (two) times daily.  . pantoprazole (PROTONIX) 40 MG tablet Take 1 tablet (40 mg total) by mouth 2 (two) times daily.  . tamsulosin (  FLOMAX) 0.4 MG CAPS capsule Take 1 capsule (0.4 mg total) by mouth daily after supper.  . vitamin B-12 1000 MCG tablet Take 1 tablet (1,000 mcg total) by mouth daily.     Allergies:   Penicillins   Social History   Tobacco Use  . Smoking status: Current Every Day Smoker    Packs/day: 2.00    Types: Cigarettes  . Smokeless tobacco: Never Used  Substance Use Topics  . Alcohol  use: Yes    Frequency: Never    Comment: a case a day  . Drug use: No     Family Hx: The patient's family history is not on file.  ROS:   Please see the history of present illness.     All other systems reviewed and are negative.   Prior CV studies:   The following studies were reviewed today:  No new  Labs/Other Tests and Data Reviewed:    EKG:  No ECG reviewed.  Recent Labs: 10/24/2018: ALT 56; TSH 6.984 10/27/2018: BUN 13; Creatinine, Ser 0.81; Hemoglobin 9.2; Magnesium 1.6; Platelets 127; Potassium 3.7; Sodium 137   Recent Lipid Panel Lab Results  Component Value Date/Time   CHOL 107 10/10/2018 07:02 AM   TRIG 69 10/10/2018 07:02 AM   HDL 21 (L) 10/10/2018 07:02 AM   CHOLHDL 5.1 10/10/2018 07:02 AM   LDLCALC 72 10/10/2018 07:02 AM    Wt Readings from Last 3 Encounters:  12/26/18 135 lb 9.6 oz (61.5 kg)  10/27/18 152 lb (68.9 kg)  10/18/17 156 lb (70.8 kg)     Objective:    Vital Signs:  BP 120/65   Pulse 72   Temp (!) 97.1 F (36.2 C)   Ht 5\' 3"  (1.6 m)   Wt 135 lb 9.6 oz (61.5 kg)   BMI 24.02 kg/m    VITAL SIGNS:  reviewed GEN:  no acute distress  ASSESSMENT & PLAN:    1. Elevated troponin   2. Essential hypertension    No active cardiovascular issues, patient is feeling well without concerns.   HTN - stable on current medications.   Patient will follow up as needed. I have spoken to his nurse at the SNF and she knows to contact us with concerns.  COVID-19 Education: The signs and symptoms of COVID-19 were discussed with the patient and how to seek care for testing (follow up with PCP or arrange E-visit).  The importance of social distancing was discussed today.  Time:   Today, I have spent 15 minutes with the patient with telehealth technology discussing the above problems.     Medication Adjustments/Labs and Tests Ordered: Current medicines are reviewed at length with the patient today.  Concerns regarding medicines are outlined above.    Tests Ordered: No orders of the defined types were placed in this encounter.   Medication Changes: No orders of the defined types were placed in this encounter.   Disposition:  Follow up prn  Signed, Parke Poisson, MD  12/26/2018 10:27 PM    [facility]

## 2020-07-09 IMAGING — DX DG CHEST 2V
2 series · 2 of 2 positions shown · non-contrast
Comparison: Chest radiograph May 17, 2016

CLINICAL DATA: Chest pain, fall today.  History of CHF.

EXAM:
CHEST - 2 VIEW

[x chest ap]
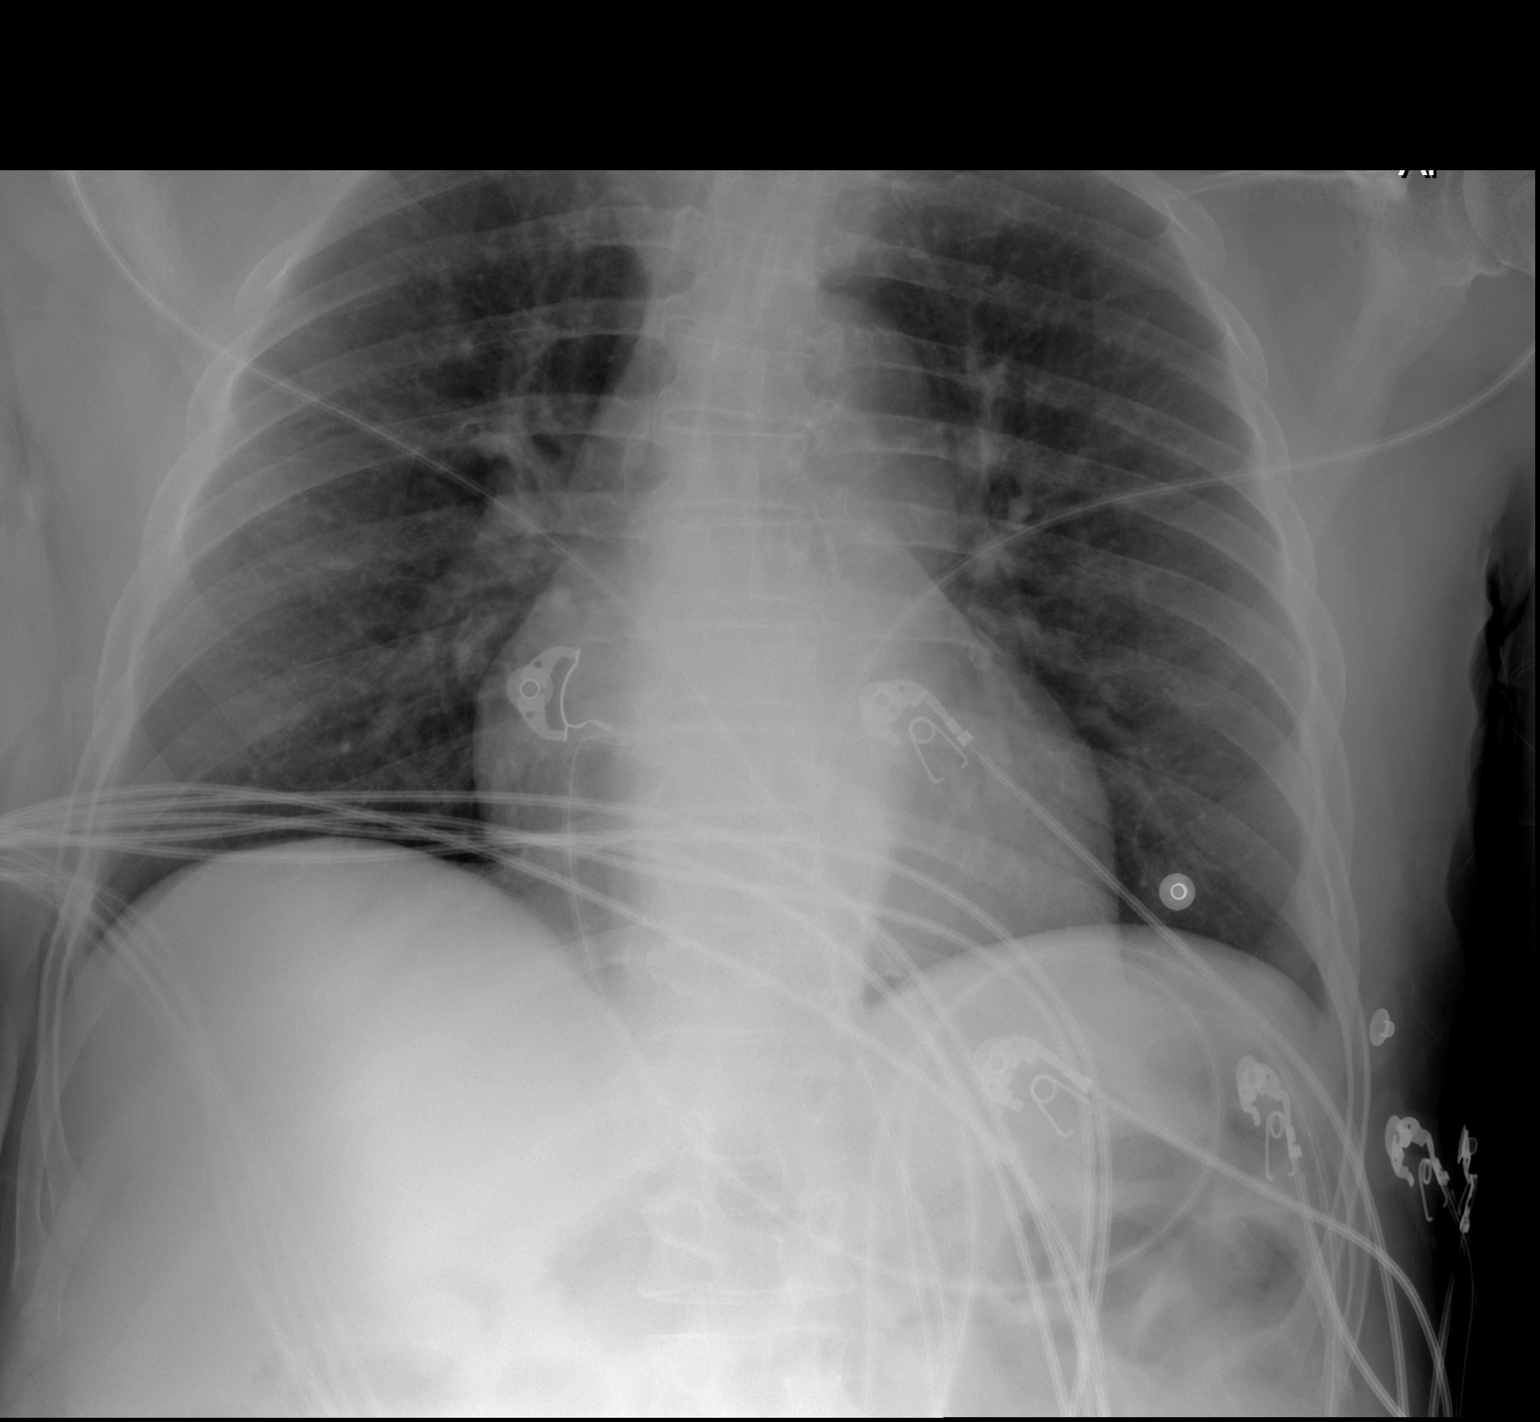

[w chest lat]
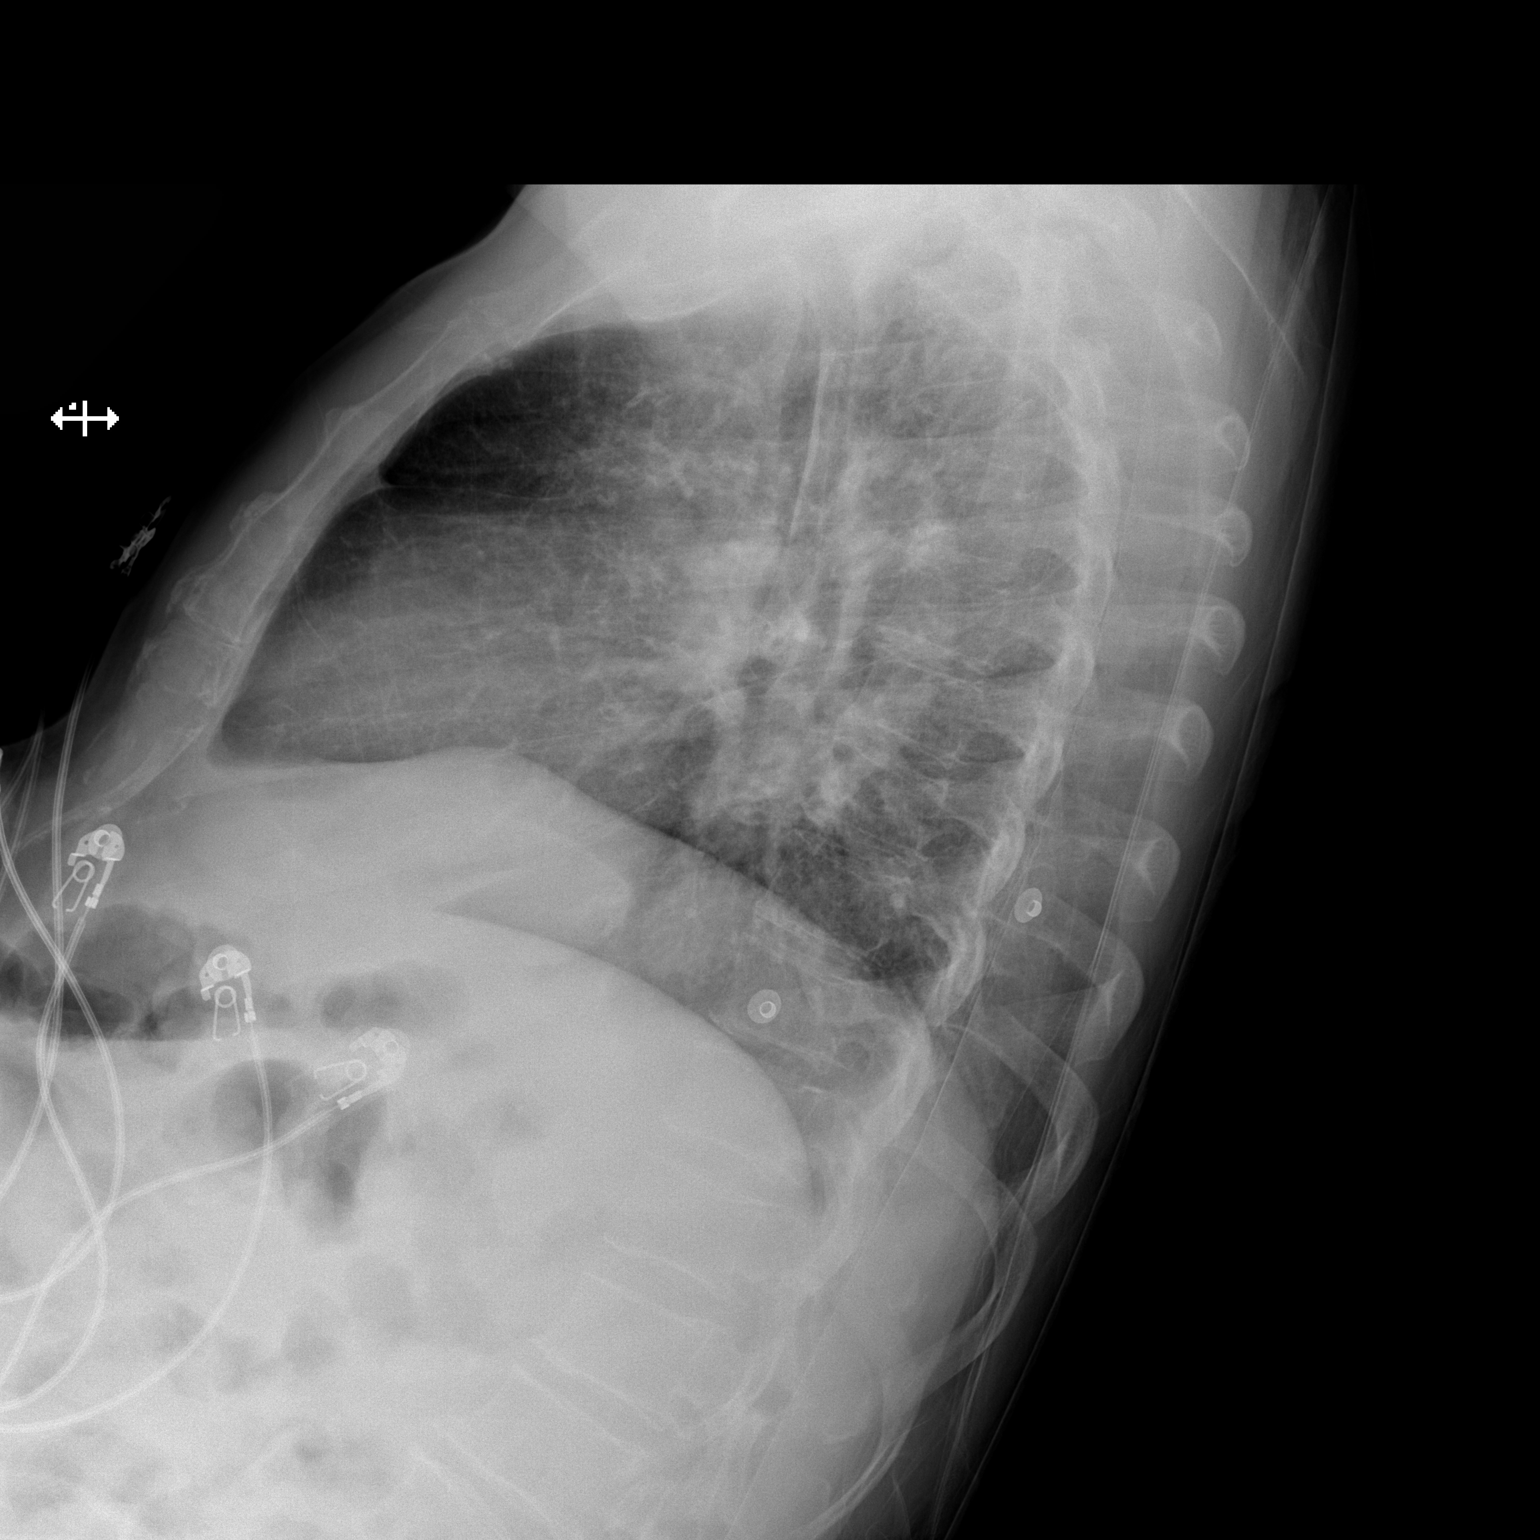

[2 of 2 positions shown; findings below may reference images not displayed]

FINDINGS: Cardiac silhouette is upper limits of normal size. Mild pulmonary
vascular congestion without pleural effusion or focal consolidation.
Biapical pleural thickening. No pneumothorax. Upper thoracic
dextroscoliosis.
IMPRESSION: Borderline cardiomegaly and mild pulmonary vascular congestion.

## 2020-07-12 IMAGING — CT CT HEAD CODE STROKE
4 series · 15 of 47 positions shown, 17 images · non-contrast
Comparison: Head CT 10/18/2017.

CLINICAL DATA: Code stroke. 57-year-old male with left facial droop
and left arm weakness.

EXAM:
CT HEAD WITHOUT CONTRAST
TECHNIQUE: Contiguous axial images were obtained from the base of the skull
through the vertex without intravenous contrast.

[Series 3: head wo · axial · 0.42mm/px · z∈[+1177,+1292]mm · 7 of 31 slices shown, 9 images]
[im 4/31  brain]
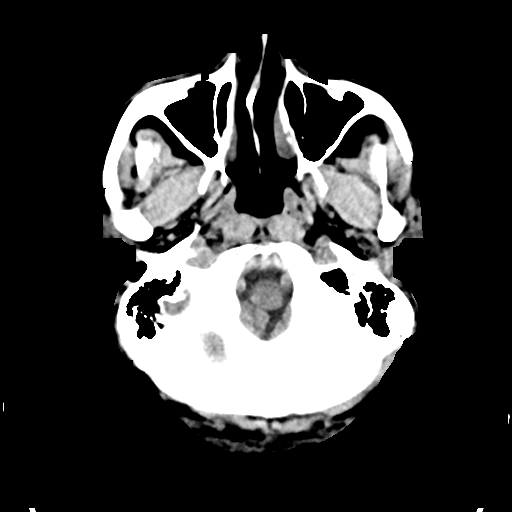
[im 4/31  bone]
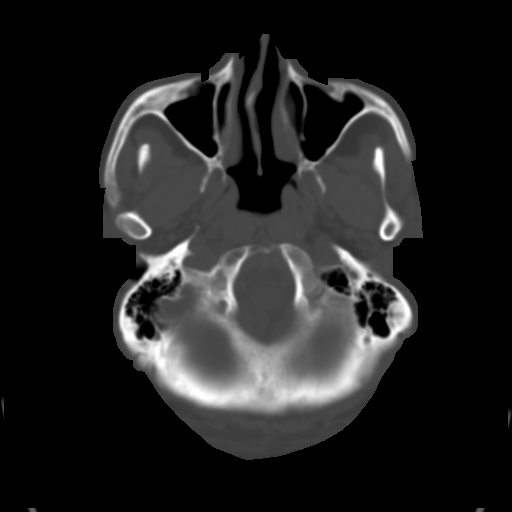
[im 8/31  brain]
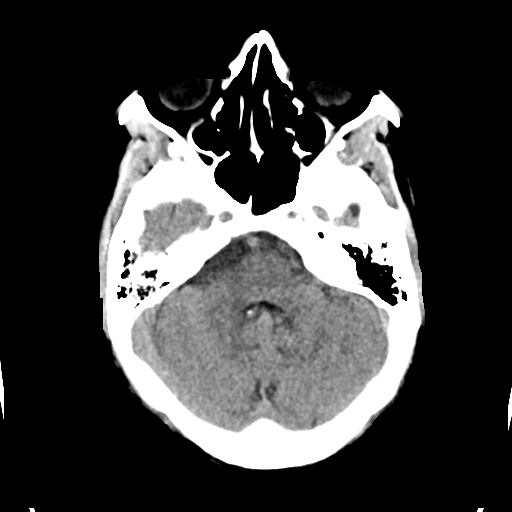
[im 12/31  brain]
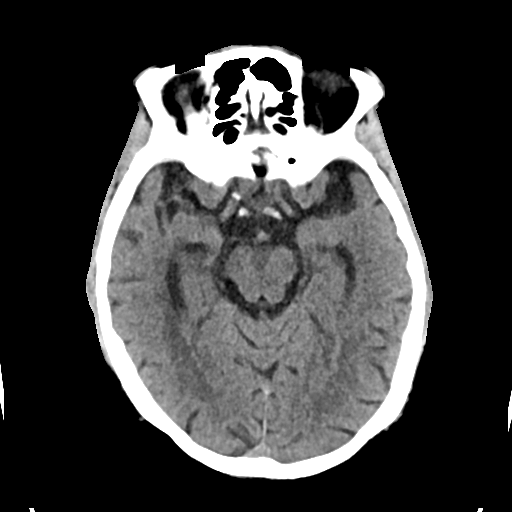
[im 16/31  brain]
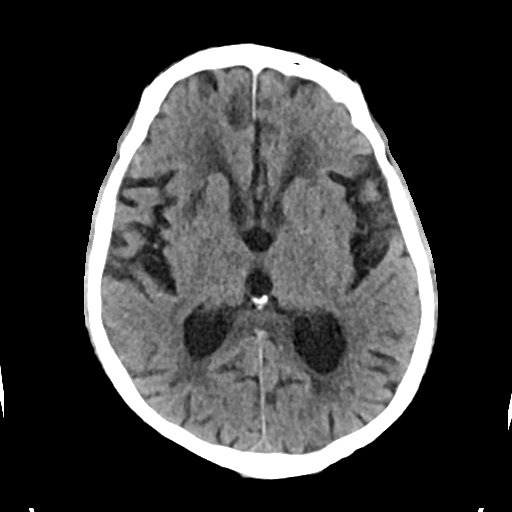
[im 19/31  brain]
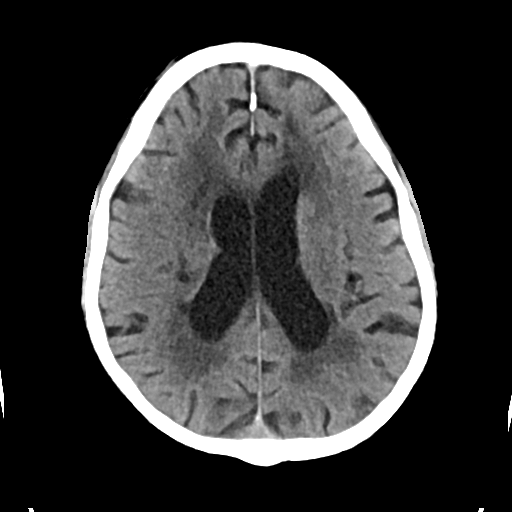
[im 19/31  bone]
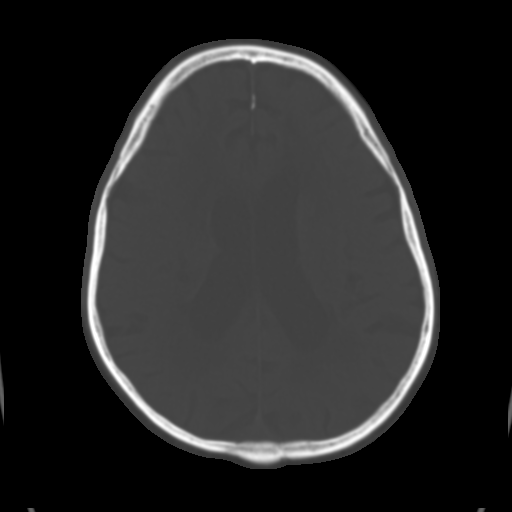
[im 23/31  brain]
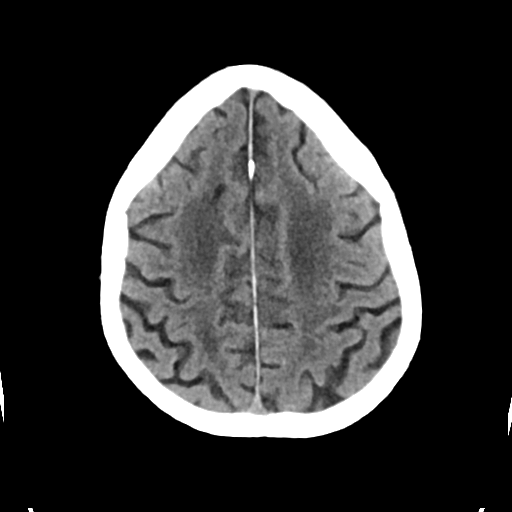
[im 27/31  brain]
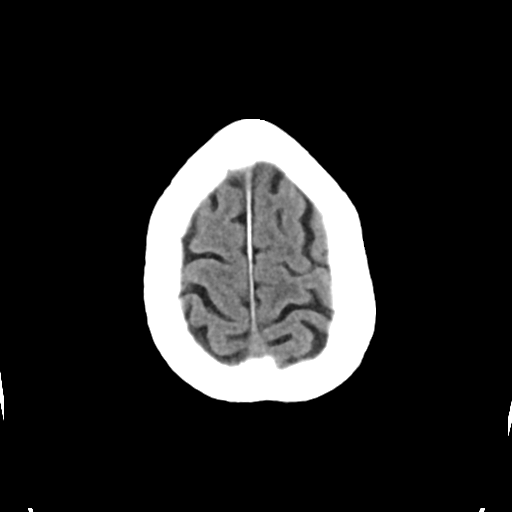

[Series 4: head bone · axial · 0.42mm/px · z∈[+1176,+1192]mm · 2 of 76 slices shown]
[im 8/76  bone]
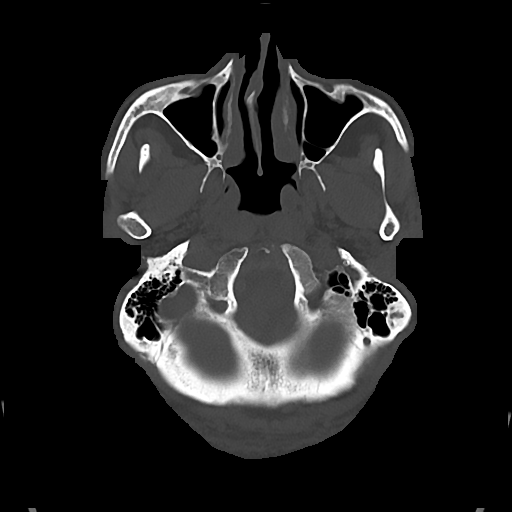
[im 16/76  bone]
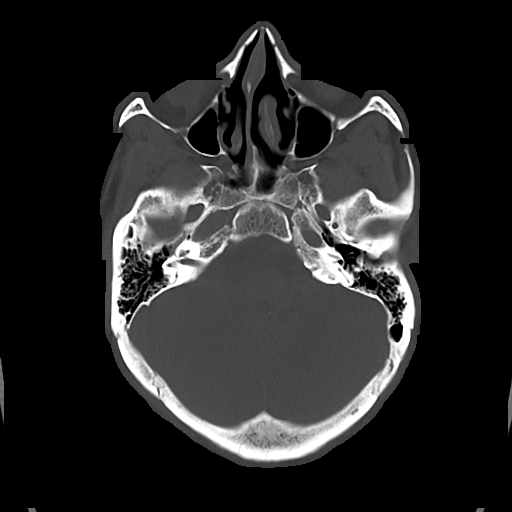

[Series 5: cor soft · coronal · 0.31mm/px · 3 of 63 slices shown]
[im 21/63  brain]
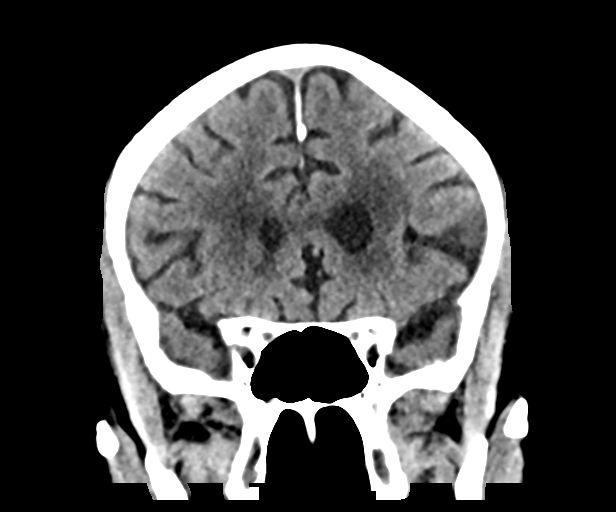
[im 28/63  brain]
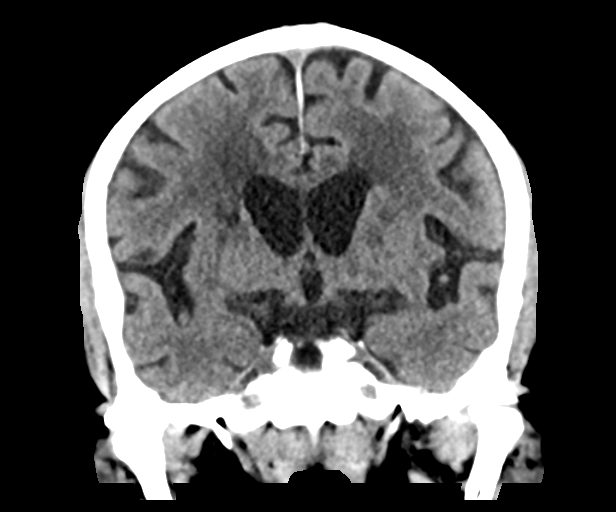
[im 35/63  brain]
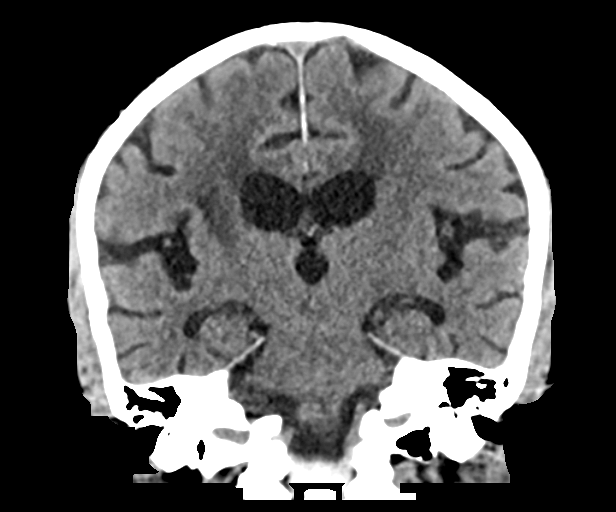

[Series 6: sag soft · sagittal · 0.32mm/px · 3 of 53 slices shown]
[im 18/53  brain]
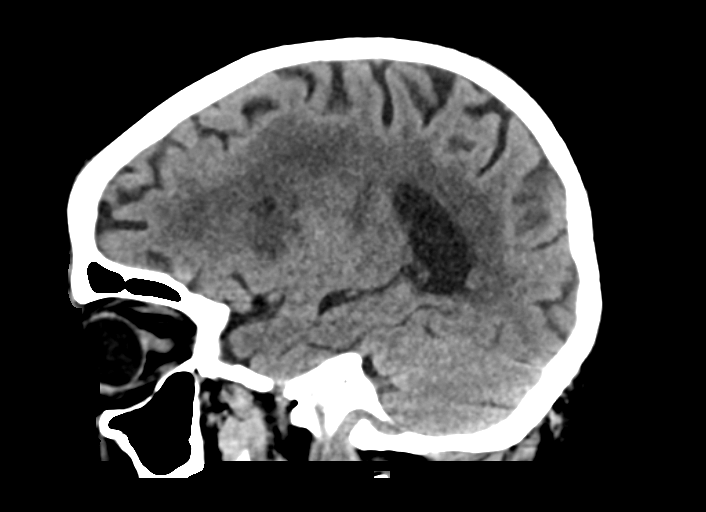
[im 27/53  brain]
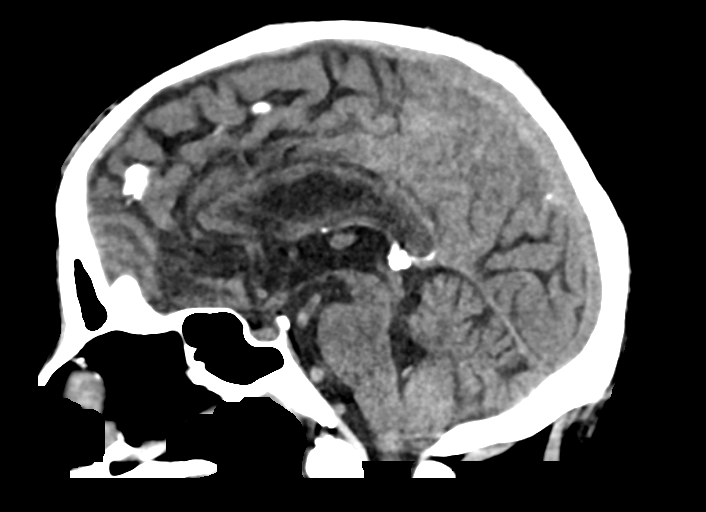
[im 35/53  brain]
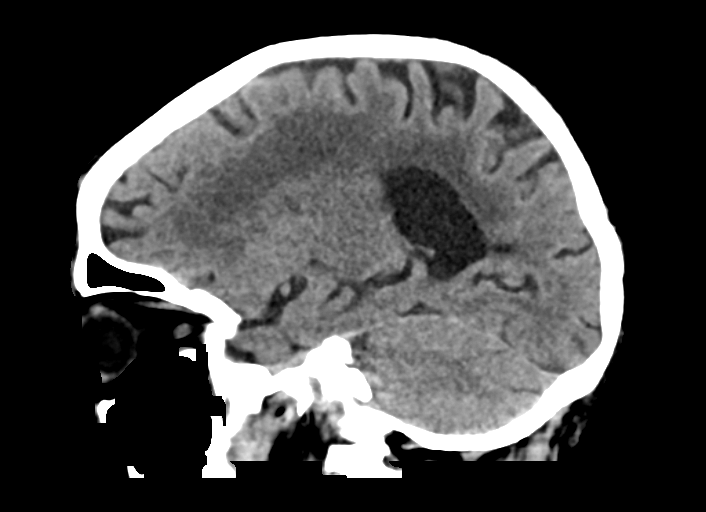

[15 of 47 positions shown; findings below may reference images not displayed]

FINDINGS: Brain: Extensive bilateral chronic cerebral white matter
hypodensity. Multifocal lacunar infarcts in the right deep white
matter and basal ganglia. Several of these are stable and chronic,
but confluent hypodensity in the right basal ganglia on series 3,
image 18 is new since 5615, as is subcortical white matter
hypodensity anteriorly on image 23. No associated hemorrhage or mass
effect.

No superimposed acute cortically based infarct identified. Stable
ventricle size and configuration. No midline shift, mass effect, or
evidence of intracranial mass lesion.

Vascular: Calcified atherosclerosis at the skull base. No suspicious
intracranial vascular hyperdensity.

Skull: Negative.

Sinuses/Orbits: Visualized paranasal sinuses and mastoids are stable
and well pneumatized.

Other: Negative orbits. Visualized scalp soft tissues are within
normal limits.

ASPECTS (Alberta Stroke Program Early CT Score)

- Ganglionic level infarction (caudate, lentiform nuclei, internal
capsule, insula, M1-M3 cortex): 5 (abnormal caudate and lentiform)

- Supraganglionic infarction (M4-M6 cortex): 3

Total score (0-10 with 10 being normal): 8.
IMPRESSION: 1. Age indeterminate right basal ganglia infarct, new since 5615.
ASPECTS is 8.
2. No associated hemorrhage or mass effect.
3. Underlying advanced chronic small vessel disease.
4. These results were communicated to Dr. Mekonnin at [DATE] pmon
10/12/2018by text page via the [employer] messaging system.

## 2022-02-13 ENCOUNTER — Emergency Department (HOSPITAL_COMMUNITY): Payer: Medicaid Other

## 2022-02-13 ENCOUNTER — Emergency Department (HOSPITAL_COMMUNITY)
Admission: EM | Admit: 2022-02-13 | Discharge: 2022-02-14 | Disposition: A | Discharge status: Skilled Nursing Facility | Payer: Medicaid Other | Attending: Emergency Medicine | Admitting: Emergency Medicine

## 2022-02-13 ENCOUNTER — Other Ambulatory Visit: Payer: Self-pay

## 2022-02-13 DIAGNOSIS — R531 Weakness: Secondary | ICD-10-CM | POA: Insufficient documentation

## 2022-02-13 DIAGNOSIS — N390 Urinary tract infection, site not specified: Secondary | ICD-10-CM

## 2022-02-13 DIAGNOSIS — R7401 Elevation of levels of liver transaminase levels: Secondary | ICD-10-CM | POA: Insufficient documentation

## 2022-02-13 DIAGNOSIS — R4182 Altered mental status, unspecified: Secondary | ICD-10-CM | POA: Diagnosis present

## 2022-02-13 LAB — CBC WITH DIFFERENTIAL/PLATELET
Abs Immature Granulocytes: 0.01 10*3/uL (ref 0.00–0.07)
Basophils Absolute: 0 10*3/uL (ref 0.0–0.1)
Basophils Relative: 1 %
Eosinophils Absolute: 0.2 10*3/uL (ref 0.0–0.5)
Eosinophils Relative: 3 %
HCT: 41.7 % (ref 39.0–52.0)
Hemoglobin: 14.4 g/dL (ref 13.0–17.0)
Immature Granulocytes: 0 %
Lymphocytes Relative: 38 %
Lymphs Abs: 2 10*3/uL (ref 0.7–4.0)
MCH: 34.5 pg — ABNORMAL HIGH (ref 26.0–34.0)
MCHC: 34.5 g/dL (ref 30.0–36.0)
MCV: 100 fL (ref 80.0–100.0)
Monocytes Absolute: 0.7 10*3/uL (ref 0.1–1.0)
Monocytes Relative: 13 %
Neutro Abs: 2.4 10*3/uL (ref 1.7–7.7)
Neutrophils Relative %: 45 %
Platelets: 74 10*3/uL — ABNORMAL LOW (ref 150–400)
RBC: 4.17 MIL/uL — ABNORMAL LOW (ref 4.22–5.81)
RDW: 17.3 % — ABNORMAL HIGH (ref 11.5–15.5)
WBC: 5.3 10*3/uL (ref 4.0–10.5)
nRBC: 0 % (ref 0.0–0.2)

## 2022-02-13 LAB — URINALYSIS, ROUTINE W REFLEX MICROSCOPIC
Bilirubin Urine: NEGATIVE
Glucose, UA: NEGATIVE mg/dL
Hgb urine dipstick: NEGATIVE
Ketones, ur: NEGATIVE mg/dL
Nitrite: NEGATIVE
Protein, ur: NEGATIVE mg/dL
Specific Gravity, Urine: 1.02 (ref 1.005–1.030)
pH: 5 (ref 5.0–8.0)

## 2022-02-13 LAB — COMPREHENSIVE METABOLIC PANEL
ALT: 89 U/L — ABNORMAL HIGH (ref 0–44)
AST: 115 U/L — ABNORMAL HIGH (ref 15–41)
Albumin: 2.4 g/dL — ABNORMAL LOW (ref 3.5–5.0)
Alkaline Phosphatase: 165 U/L — ABNORMAL HIGH (ref 38–126)
Anion gap: 7 (ref 5–15)
BUN: 10 mg/dL (ref 8–23)
CO2: 20 mmol/L — ABNORMAL LOW (ref 22–32)
Calcium: 8 mg/dL — ABNORMAL LOW (ref 8.9–10.3)
Chloride: 114 mmol/L — ABNORMAL HIGH (ref 98–111)
Creatinine, Ser: 0.96 mg/dL (ref 0.61–1.24)
GFR, Estimated: >60 mL/min (ref >60)
Glucose, Bld: 95 mg/dL (ref 70–99)
Potassium: 4.9 mmol/L (ref 3.5–5.1)
Sodium: 141 mmol/L (ref 135–145)
Total Bilirubin: 2.7 mg/dL — ABNORMAL HIGH (ref 0.3–1.2)
Total Protein: 6.1 g/dL — ABNORMAL LOW (ref 6.5–8.1)

## 2022-02-13 MED ORDER — LACTATED RINGERS IV SOLN: INTRAVENOUS | Status: DC

## 2022-02-13 MED ORDER — SODIUM CHLORIDE 0.9 % IV SOLN
1.0000 g | INTRAVENOUS | Status: DC
  Administered 2022-02-13: 1 g via INTRAVENOUS
  Filled 2022-02-13: qty 10

## 2022-02-13 MED ORDER — LACTATED RINGERS IV BOLUS
1000.0000 mL | Freq: Once | INTRAVENOUS | Status: AC
  Administered 2022-02-13: 1000 mL via INTRAVENOUS

## 2022-02-13 MED ORDER — CEFDINIR 300 MG PO CAPS: 300.0000 mg | ORAL_CAPSULE | Freq: Two times a day (BID) | ORAL | 0 refills | Status: DC

## 2022-02-13 NOTE — ED Notes (Signed)
Attempted to call report to [facility] - no answer.

## 2022-02-13 NOTE — ED Notes (Signed)
Called [facility] to transport patient to [facility]. Patient is currently 6th in line

## 2022-02-13 NOTE — ED Notes (Signed)
Bladder scan shows 100mL

## 2022-02-13 NOTE — ED Triage Notes (Signed)
BIB [facility] from [facility]. Staff reports patient has been talking less, less energetic, general failure to thrive symptoms. Staff reports this has been taking place over 9 days. Previous CVA with left sided deficits. Several conflicting reports given by staff on scene. Family unable to be reached at this time- only a friend's phone number available.

## 2022-02-13 NOTE — ED Notes (Signed)
Patient transported to CT 

## 2022-02-13 NOTE — ED Provider Notes (Signed)
[name] EMERGENCY DEPARTMENT Provider Note   CSN: [id number] Arrival date & time: 02/13/22  1908     History  Chief Complaint  Patient presents with   Altered Mental Status    Jacob Bass is a 61 y.o. male.  61 year old male who presents from SNF due to having 9 days of decreased creased energy.  Patient does have a history of stroke before in the past with resultant left-sided deficits.  Possibility patient may have had a stroke according to EMS.  Patient himself denies any headache, chest or abdominal complaints.  Patient reportedly has not been wanting to eat or drink.  Was sent here for further management       Home Medications Prior to Admission medications   Medication Sig Start Date End Date Taking? Authorizing Provider  amLODipine (NORVASC) 2.5 MG tablet Take 1 tablet (2.5 mg total) by mouth daily. 10/20/18   Marinda Elk, MD  aspirin 325 MG tablet Take 1 tablet (325 mg total) by mouth daily. 10/28/18   [name], MD  atorvastatin (LIPITOR) 40 MG tablet Place 1 tablet (40 mg total) into feeding tube daily at 6 PM. 10/20/18   Marinda Elk, MD  feeding supplement, ENSURE ENLIVE, (ENSURE ENLIVE) LIQD Take 237 mLs by mouth daily at 3 pm. 10/28/18   [name], MD  folic acid (FOLVITE) 1 MG tablet Take 1 tablet (1 mg total) by mouth daily. 10/28/18   [name], MD  guaiFENesin (MUCINEX) 600 MG 12 hr tablet Take 1 tablet (600 mg total) by mouth 2 (two) times daily. 10/27/18   [name], MD  ipratropium-albuterol (DUONEB) 0.5-2.5 (3) MG/3ML SOLN Take 3 mLs by nebulization every 4 (four) hours as needed. 10/27/18   [name], MD  lisinopril (PRINIVIL,ZESTRIL) 5 MG tablet Take 1 tablet (5 mg total) by mouth daily. 10/27/18 10/27/19  [name], MD  metoprolol tartrate (LOPRESSOR) 25 MG tablet Take 1 tablet (25 mg total) by mouth 2 (two) times daily. 10/20/18   Marinda Elk, MD  pantoprazole (PROTONIX) 40 MG tablet Take 1 tablet (40 mg total) by mouth 2  (two) times daily. 10/20/18   Marinda Elk, MD  tamsulosin (FLOMAX) 0.4 MG CAPS capsule Take 1 capsule (0.4 mg total) by mouth daily after supper. 10/27/18   [name], MD  vitamin B-12 1000 MCG tablet Take 1 tablet (1,000 mcg total) by mouth daily. 10/28/18   [name], MD      Allergies    Penicillins    Review of Systems   Review of Systems  All other systems reviewed and are negative.   Physical Exam Updated Vital Signs BP (!) 152/71   Pulse 73   Temp 97.8 F (36.6 C) (Oral)   Resp 18   SpO2 100%  Physical Exam Vitals and nursing note reviewed.  Constitutional:      General: He is not in acute distress.    Appearance: Normal appearance. He is well-developed. He is not toxic-appearing.  HENT:     Head: Normocephalic and atraumatic.  Eyes:     General: Lids are normal.     Conjunctiva/sclera: Conjunctivae normal.     Pupils: Pupils are equal, round, and reactive to light.  Neck:     Thyroid: No thyroid mass.     Trachea: No tracheal deviation.  Cardiovascular:     Rate and Rhythm: Normal rate and regular rhythm.     Heart sounds: Normal heart sounds. No murmur heard.  No gallop.  Pulmonary:     Effort: Pulmonary effort is normal. No respiratory distress.     Breath sounds: Normal breath sounds. No stridor. No decreased breath sounds, wheezing, rhonchi or rales.  Abdominal:     General: There is no distension.     Palpations: Abdomen is soft.     Tenderness: There is no abdominal tenderness. There is no rebound.  Musculoskeletal:        General: No tenderness. Normal range of motion.     Cervical back: Normal range of motion and neck supple.  Skin:    General: Skin is warm and dry.     Findings: No abrasion or rash.  Neurological:     Mental Status: He is alert and oriented to person, place, and time.     GCS: GCS eye subscore is 4. GCS verbal subscore is 5. GCS motor subscore is 6.     Cranial Nerves: No cranial nerve deficit.     Sensory: No sensory  deficit.     Motor: Weakness and atrophy present.     Comments: Left upper and left lower extremity strength 105.  Psychiatric:        Attention and Perception: Attention normal.        Speech: Speech normal.        Behavior: Behavior normal.     ED Results / Procedures / Treatments   Labs (all labs ordered are listed, but only abnormal results are displayed) Labs Reviewed  CBC WITH DIFFERENTIAL/PLATELET  URINALYSIS, ROUTINE W REFLEX MICROSCOPIC  COMPREHENSIVE METABOLIC PANEL    EKG EKG Interpretation  Date/Time:  Tuesday February 13 2022 19:34:05 EDT Ventricular Rate:  78 PR Interval:    QRS Duration: 86 QT Interval:  387 QTC Calculation: 441 R Axis:   28 Text Interpretation: Ventricular premature complex Borderline T abnormalities, inferior leads Confirmed by Lorre Nick ([id number]) on 02/13/2022 10:00:19 PM  Radiology No results found.  Procedures Procedures    Medications Ordered in ED Medications  lactated ringers bolus 1,000 mL (has no administration in time range)  lactated ringers infusion (has no administration in time range)    ED Course/ Medical Decision Making/ A&P                           Medical Decision Making Amount and/or Complexity of Data Reviewed Labs: ordered. Radiology: ordered.  Risk Prescription drug management.  Patient given IV hydration here.  He is remained alert and talkative here.  Slightly decreased platelets noted on his CBC.  Electrolytes significant for mildly increased transaminases as well as bili.  Patient has no abdominal pain at this time.  Do not feel this requires further evaluation here in the ED.  Will require evaluation by his primary care doctor.  His abdominal exam is benign. Patient is EKG per my interpretation shows no acute ischemic changes.  Head CT per my interpretation shows no acute intracranial abnormality.  Chest x-ray per my interpretation shows no active disease.  Patient's urinalysis does show UTI as this was  a cath specimen.  Will place patient on antibiotics and sent back to his facility.  Will give first dose here before he goes back        Final Clinical Impression(s) / ED Diagnoses Final diagnoses:  None    Rx / DC Orders ED Discharge Orders     None         Lorre Nick, MD 02/13/22 2256

## 2022-02-14 NOTE — ED Notes (Signed)
Attempted to call report to [facility] x2 with no answer.

## 2022-04-12 ENCOUNTER — Emergency Department (HOSPITAL_COMMUNITY): Payer: Medicaid Other

## 2022-04-12 ENCOUNTER — Inpatient Hospital Stay (HOSPITAL_COMMUNITY)
Admission: EM | Admit: 2022-04-12 | Discharge: 2022-04-18 | DRG: 442 | Disposition: A | Discharge status: Skilled Nursing Facility | Payer: Medicaid Other | Source: Skilled Nursing Facility | Attending: Internal Medicine | Admitting: Internal Medicine

## 2022-04-12 DIAGNOSIS — N390 Urinary tract infection, site not specified: Secondary | ICD-10-CM | POA: Diagnosis present

## 2022-04-12 DIAGNOSIS — R64 Cachexia: Secondary | ICD-10-CM | POA: Diagnosis present

## 2022-04-12 DIAGNOSIS — Z6827 Body mass index (BMI) 27.0-27.9, adult: Secondary | ICD-10-CM

## 2022-04-12 DIAGNOSIS — E46 Unspecified protein-calorie malnutrition: Secondary | ICD-10-CM | POA: Diagnosis present

## 2022-04-12 DIAGNOSIS — R54 Age-related physical debility: Secondary | ICD-10-CM | POA: Diagnosis present

## 2022-04-12 DIAGNOSIS — Z8619 Personal history of other infectious and parasitic diseases: Secondary | ICD-10-CM

## 2022-04-12 DIAGNOSIS — F0390 Unspecified dementia without behavioral disturbance: Secondary | ICD-10-CM | POA: Diagnosis present

## 2022-04-12 DIAGNOSIS — G9341 Metabolic encephalopathy: Secondary | ICD-10-CM | POA: Diagnosis present

## 2022-04-12 DIAGNOSIS — Z880 Allergy status to penicillin: Secondary | ICD-10-CM

## 2022-04-12 DIAGNOSIS — Z7189 Other specified counseling: Secondary | ICD-10-CM

## 2022-04-12 DIAGNOSIS — A599 Trichomoniasis, unspecified: Secondary | ICD-10-CM | POA: Diagnosis present

## 2022-04-12 DIAGNOSIS — K7682 Hepatic encephalopathy: Principal | ICD-10-CM | POA: Diagnosis present

## 2022-04-12 DIAGNOSIS — Z7982 Long term (current) use of aspirin: Secondary | ICD-10-CM

## 2022-04-12 DIAGNOSIS — E162 Hypoglycemia, unspecified: Secondary | ICD-10-CM

## 2022-04-12 DIAGNOSIS — K5900 Constipation, unspecified: Secondary | ICD-10-CM | POA: Diagnosis present

## 2022-04-12 DIAGNOSIS — K7460 Unspecified cirrhosis of liver: Secondary | ICD-10-CM | POA: Diagnosis present

## 2022-04-12 DIAGNOSIS — I10 Essential (primary) hypertension: Secondary | ICD-10-CM | POA: Diagnosis present

## 2022-04-12 DIAGNOSIS — E875 Hyperkalemia: Secondary | ICD-10-CM | POA: Diagnosis present

## 2022-04-12 DIAGNOSIS — E870 Hyperosmolality and hypernatremia: Secondary | ICD-10-CM | POA: Diagnosis present

## 2022-04-12 DIAGNOSIS — D696 Thrombocytopenia, unspecified: Secondary | ICD-10-CM | POA: Diagnosis present

## 2022-04-12 DIAGNOSIS — J449 Chronic obstructive pulmonary disease, unspecified: Secondary | ICD-10-CM | POA: Diagnosis present

## 2022-04-12 DIAGNOSIS — Z8673 Personal history of transient ischemic attack (TIA), and cerebral infarction without residual deficits: Secondary | ICD-10-CM

## 2022-04-12 DIAGNOSIS — R627 Adult failure to thrive: Secondary | ICD-10-CM | POA: Diagnosis present

## 2022-04-12 LAB — COMPREHENSIVE METABOLIC PANEL
ALT: 82 U/L — ABNORMAL HIGH (ref 0–44)
AST: 88 U/L — ABNORMAL HIGH (ref 15–41)
Albumin: 2.3 g/dL — ABNORMAL LOW (ref 3.5–5.0)
Alkaline Phosphatase: 116 U/L (ref 38–126)
Anion gap: 5 (ref 5–15)
BUN: 15 mg/dL (ref 8–23)
CO2: 19 mmol/L — ABNORMAL LOW (ref 22–32)
Calcium: 8.4 mg/dL — ABNORMAL LOW (ref 8.9–10.3)
Chloride: 122 mmol/L — ABNORMAL HIGH (ref 98–111)
Creatinine, Ser: 0.92 mg/dL (ref 0.61–1.24)
GFR, Estimated: >60 mL/min (ref >60)
Glucose, Bld: 82 mg/dL (ref 70–99)
Potassium: 4.6 mmol/L (ref 3.5–5.1)
Sodium: 146 mmol/L — ABNORMAL HIGH (ref 135–145)
Total Bilirubin: 1.8 mg/dL — ABNORMAL HIGH (ref 0.3–1.2)
Total Protein: 6.8 g/dL (ref 6.5–8.1)

## 2022-04-12 LAB — URINALYSIS, ROUTINE W REFLEX MICROSCOPIC
Bilirubin Urine: NEGATIVE
Glucose, UA: NEGATIVE mg/dL
Hgb urine dipstick: NEGATIVE
Ketones, ur: NEGATIVE mg/dL
Nitrite: NEGATIVE
Protein, ur: 30 mg/dL — AB
Specific Gravity, Urine: 1.017 (ref 1.005–1.030)
WBC, UA: >50 WBC/hpf — ABNORMAL HIGH (ref 0–5)
pH: 6 (ref 5.0–8.0)

## 2022-04-12 LAB — CBC WITH DIFFERENTIAL/PLATELET
Abs Immature Granulocytes: 0.03 10*3/uL (ref 0.00–0.07)
Basophils Absolute: 0.1 10*3/uL (ref 0.0–0.1)
Basophils Relative: 1 %
Eosinophils Absolute: 0.1 10*3/uL (ref 0.0–0.5)
Eosinophils Relative: 1 %
HCT: 44.1 % (ref 39.0–52.0)
Hemoglobin: 14.8 g/dL (ref 13.0–17.0)
Immature Granulocytes: 0 %
Lymphocytes Relative: 36 %
Lymphs Abs: 2.8 10*3/uL (ref 0.7–4.0)
MCH: 34 pg (ref 26.0–34.0)
MCHC: 33.6 g/dL (ref 30.0–36.0)
MCV: 101.4 fL — ABNORMAL HIGH (ref 80.0–100.0)
Monocytes Absolute: 1.3 10*3/uL — ABNORMAL HIGH (ref 0.1–1.0)
Monocytes Relative: 16 %
Neutro Abs: 3.5 10*3/uL (ref 1.7–7.7)
Neutrophils Relative %: 46 %
Platelets: 81 10*3/uL — ABNORMAL LOW (ref 150–400)
RBC: 4.35 MIL/uL (ref 4.22–5.81)
RDW: 16.1 % — ABNORMAL HIGH (ref 11.5–15.5)
WBC: 7.7 10*3/uL (ref 4.0–10.5)
nRBC: 0 % (ref 0.0–0.2)

## 2022-04-12 LAB — BASIC METABOLIC PANEL
Anion gap: 6 (ref 5–15)
BUN: 13 mg/dL (ref 8–23)
CO2: 21 mmol/L — ABNORMAL LOW (ref 22–32)
Calcium: 8.4 mg/dL — ABNORMAL LOW (ref 8.9–10.3)
Chloride: 120 mmol/L — ABNORMAL HIGH (ref 98–111)
Creatinine, Ser: 0.9 mg/dL (ref 0.61–1.24)
GFR, Estimated: >60 mL/min (ref >60)
Glucose, Bld: 86 mg/dL (ref 70–99)
Potassium: 3.8 mmol/L (ref 3.5–5.1)
Sodium: 147 mmol/L — ABNORMAL HIGH (ref 135–145)

## 2022-04-12 LAB — PROTIME-INR
INR: 1.3 — ABNORMAL HIGH (ref 0.8–1.2)
Prothrombin Time: 16.1 seconds — ABNORMAL HIGH (ref 11.4–15.2)

## 2022-04-12 LAB — I-STAT VENOUS BLOOD GAS, ED
Acid-Base Excess: 0 mmol/L (ref 0.0–2.0)
Bicarbonate: 22.7 mmol/L (ref 20.0–28.0)
Calcium, Ion: 0.83 mmol/L — CL (ref 1.15–1.40)
HCT: 44 % (ref 39.0–52.0)
Hemoglobin: 15 g/dL (ref 13.0–17.0)
O2 Saturation: 98 %
Potassium: >8.5 mmol/L (ref 3.5–5.1)
Sodium: 140 mmol/L (ref 135–145)
TCO2: 24 mmol/L (ref 22–32)
pCO2, Ven: 31.6 mmHg — ABNORMAL LOW (ref 44–60)
pH, Ven: 7.464 — ABNORMAL HIGH (ref 7.25–7.43)
pO2, Ven: 90 mmHg — ABNORMAL HIGH (ref 32–45)

## 2022-04-12 LAB — CBG MONITORING, ED
Glucose-Capillary: 60 mg/dL — ABNORMAL LOW (ref 70–99)
Glucose-Capillary: 52 mg/dL — ABNORMAL LOW (ref 70–99)
Glucose-Capillary: 69 mg/dL — ABNORMAL LOW (ref 70–99)
Glucose-Capillary: 59 mg/dL — ABNORMAL LOW (ref 70–99)
Glucose-Capillary: 135 mg/dL — ABNORMAL HIGH (ref 70–99)

## 2022-04-12 LAB — BLOOD GAS, VENOUS
Acid-base deficit: 1.3 mmol/L (ref 0.0–2.0)
Bicarbonate: 23.7 mmol/L (ref 20.0–28.0)
Drawn by: 1470
O2 Saturation: 61.7 %
Patient temperature: 36.6
pCO2, Ven: 39 mmHg — ABNORMAL LOW (ref 44–60)
pH, Ven: 7.39 (ref 7.25–7.43)
pO2, Ven: 39 mmHg (ref 32–45)

## 2022-04-12 LAB — AMMONIA: Ammonia: 79 umol/L — ABNORMAL HIGH (ref 9–35)

## 2022-04-12 LAB — TECHNOLOGIST SMEAR REVIEW

## 2022-04-12 MED ORDER — LISINOPRIL 5 MG PO TABS
5.0000 mg | ORAL_TABLET | Freq: Every day | ORAL | Status: DC
  Administered 2022-04-13 – 2022-04-18 (×4): 5 mg via ORAL
  Filled 2022-04-12 (×5): qty 1

## 2022-04-12 MED ORDER — IPRATROPIUM-ALBUTEROL 0.5-2.5 (3) MG/3ML IN SOLN
3.0000 mL | RESPIRATORY_TRACT | Status: DC | PRN
  Administered 2022-04-14 – 2022-04-17 (×5): 3 mL via RESPIRATORY_TRACT
  Filled 2022-04-12 (×3): qty 3

## 2022-04-12 MED ORDER — TAMSULOSIN HCL 0.4 MG PO CAPS
0.4000 mg | ORAL_CAPSULE | Freq: Every day | ORAL | Status: DC
  Administered 2022-04-13 – 2022-04-18 (×4): 0.4 mg via ORAL
  Filled 2022-04-12 (×5): qty 1

## 2022-04-12 MED ORDER — SODIUM CHLORIDE 0.9 % IV SOLN: 1.0000 g | INTRAVENOUS | Status: DC

## 2022-04-12 MED ORDER — METOPROLOL TARTRATE 25 MG PO TABS
25.0000 mg | ORAL_TABLET | Freq: Two times a day (BID) | ORAL | Status: DC
  Administered 2022-04-13 – 2022-04-18 (×7): 25 mg via ORAL
  Filled 2022-04-12 (×8): qty 1

## 2022-04-12 MED ORDER — ATORVASTATIN CALCIUM 40 MG PO TABS: 40.0000 mg | ORAL_TABLET | Freq: Every day | ORAL | Status: DC

## 2022-04-12 MED ORDER — LACTATED RINGERS IV BOLUS
1000.0000 mL | Freq: Once | INTRAVENOUS | Status: AC
  Administered 2022-04-12: 1000 mL via INTRAVENOUS

## 2022-04-12 MED ORDER — AMLODIPINE BESYLATE 2.5 MG PO TABS
2.5000 mg | ORAL_TABLET | Freq: Every day | ORAL | Status: DC
  Administered 2022-04-13 – 2022-04-18 (×4): 2.5 mg via ORAL
  Filled 2022-04-12 (×3): qty 1

## 2022-04-12 MED ORDER — SODIUM CHLORIDE 0.9 % IV SOLN
1.0000 g | INTRAVENOUS | Status: DC
  Administered 2022-04-12 – 2022-04-14 (×3): 1 g via INTRAVENOUS
  Filled 2022-04-12 (×3): qty 10

## 2022-04-12 MED ORDER — DEXTROSE IN LACTATED RINGERS 5 % IV SOLN: INTRAVENOUS | Status: AC

## 2022-04-12 MED ORDER — PANTOPRAZOLE SODIUM 40 MG PO TBEC
40.0000 mg | DELAYED_RELEASE_TABLET | Freq: Two times a day (BID) | ORAL | Status: DC
  Administered 2022-04-13 (×2): 40 mg via ORAL
  Filled 2022-04-12 (×3): qty 1

## 2022-04-12 MED ORDER — ENOXAPARIN SODIUM 40 MG/0.4ML IJ SOSY
40.0000 mg | PREFILLED_SYRINGE | INTRAMUSCULAR | Status: DC
  Administered 2022-04-13 – 2022-04-17 (×6): 40 mg via SUBCUTANEOUS
  Filled 2022-04-12 (×6): qty 0.4

## 2022-04-12 MED ORDER — ASPIRIN 325 MG PO TABS
325.0000 mg | ORAL_TABLET | Freq: Every day | ORAL | Status: DC
  Administered 2022-04-13 – 2022-04-18 (×4): 325 mg via ORAL
  Filled 2022-04-12 (×5): qty 1

## 2022-04-12 MED ORDER — DEXTROSE 50 % IV SOLN
25.0000 mL | Freq: Once | INTRAVENOUS | Status: AC
  Administered 2022-04-12: 25 mL via INTRAVENOUS
  Filled 2022-04-12: qty 50

## 2022-04-12 NOTE — ED Notes (Signed)
Received verbal report from Ginny M RN at this time 

## 2022-04-12 NOTE — ED Notes (Signed)
Attempted to give pt applesauce.  Pt swallows with no choking but you can hear a gulp as he is swallowing.  Pt is unable to feed himself but will open his mouth when feeding and knows to swallows

## 2022-04-12 NOTE — ED Notes (Signed)
Pt continues to pull off pulse ox, and cardiac leads.  I am able to to replace the leads but continues to pull of pulse ox

## 2022-04-12 NOTE — H&P (Addendum)
Date: 04/12/2022               Patient Name:  Jacob Bass MRN: [id number]  DOB: 13-Apr-1961 Age / Sex: 61 y.o., male   PCP: Patient, No Pcp Per         Medical Service: Internal Medicine Teaching Service         Attending Physician: Dr. Inez Catalina, MD    First Contact: Rana Snare, DO      Pager: MZ [phone]      Second Contact: Thalia Bloodgood, DO      Pager: Theresa Duty [phone]           After Hours (After 5p/  First Contact Pager: [phone]  weekends / holidays): Second Contact Pager: [phone]   SUBJECTIVE   Chief Complaint: Failure to Thrive  History of Present Illness:  Jacob Bass is a 61 year old male with PMH of dementia, cirrhosis and hepatitis C previous subarachnoid hemorrhage, previous alcohol abuse who is chronically nonverbal presents to the ED with decreased p.o. intake. History provided by ED who talked to Dr. Allena Katz from the facility.  Has not been eating well past few weeks and had a COPD exacerbation about 2 weeks ago. Dr. Allena Katz was concerned that this morning he was more confused and less responsive, possibly from UTI vs dehydration. He is also concerned because of patient's history of subarachnoid hemorrhage. His baseline at the facility is he sits up but is nonverbal. He eats only when someone feeds him.   Patient does not have a guardian or family that is involved with his care.   ED Course: Patient was hypoglycemic upon arrival, given dextrose in ED. Labs show hypernatremia and thrombocytopenia. Transaminases mildly elevated.   Meds:  Amlodipine 2.5 mg Aspirin 325 mg Atorvastatin 40 mg Lisinopril 5 mg Metoprolol 25 Protonix 40 mg Flomax 0.4 mg Duoneb   Past Medical History Past Medical History:  Diagnosis Date   CHF (congestive heart failure) (HCC)    Hypertension     Past Surgical History:  Procedure Laterality Date   BIOPSY  10/19/2018   Procedure: BIOPSY;  Surgeon: Benancio Deeds, MD;  Location: [facility] ENDOSCOPY;  Service:  Gastroenterology;;   ESOPHAGOGASTRODUODENOSCOPY (EGD) WITH PROPOFOL N/A 10/19/2018   Procedure: ESOPHAGOGASTRODUODENOSCOPY (EGD);  Surgeon: Benancio Deeds, MD;  Location: [facility] ENDOSCOPY;  Service: Gastroenterology;  Laterality: N/A;    Social:  Lives at: [facility]  Support: none, family not involved in care Level of Function: nonverbal, dependent on ADLs and IADLs PCP: Dr. Allena Katz from facility Substances: unknown  Family History:  No family history on file.   Allergies: Allergies as of 04/12/2022 - Review Complete 10/19/2018  Allergen Reaction Noted   Penicillins Hives 10/09/2018    Review of Systems: A complete ROS was negative except as per HPI.   OBJECTIVE:   Physical Exam: Blood pressure 130/81, pulse 68, temperature 97.8 F (36.6 C), temperature source Oral, resp. rate 17, SpO2 93 %.  Constitutional: patient is thin and cachetic, in no acute distress HENT: normocephalic atraumatic, mucous membranes dry Neck: supple Cardiovascular: regular rate and rhythm Pulmonary/Chest: normal work of breathing on room air, lungs clear to auscultation bilaterally Abdominal: soft, non-tender, non-distended, no guarding Neurological: awake but nonverbal Skin: warm and dry   Labs: CBC    Component Value Date/Time   WBC 7.7 04/12/2022 1141   RBC 4.35 04/12/2022 1141   HGB 15.0 04/12/2022 1222   HCT 44.0 04/12/2022 1222   HCT 21.9 (L) 10/10/2018 9622  PLT 81 (L) 04/12/2022 1141   MCV 101.4 (H) 04/12/2022 1141   MCH 34.0 04/12/2022 1141   MCHC 33.6 04/12/2022 1141   RDW 16.1 (H) 04/12/2022 1141   LYMPHSABS 2.8 04/12/2022 1141   MONOABS 1.3 (H) 04/12/2022 1141   EOSABS 0.1 04/12/2022 1141   BASOSABS 0.1 04/12/2022 1141     CMP     Component Value Date/Time   NA 140 04/12/2022 1222   K >8.5 (HH) 04/12/2022 1222   CL 122 (H) 04/12/2022 1141   CO2 19 (L) 04/12/2022 1141   GLUCOSE 82 04/12/2022 1141   BUN 15 04/12/2022 1141   CREATININE 0.92 04/12/2022  1141   CALCIUM 8.4 (L) 04/12/2022 1141   PROT 6.8 04/12/2022 1141   ALBUMIN 2.3 (L) 04/12/2022 1141   AST 88 (H) 04/12/2022 1141   ALT 82 (H) 04/12/2022 1141   ALKPHOS 116 04/12/2022 1141   BILITOT 1.8 (H) 04/12/2022 1141   GFRNONAA >60 04/12/2022 1141   GFRAA >60 10/27/2018 0537    Imaging: CXR negative. CT head showed no acute processes.   EKG: personally reviewed my interpretation is normal sinus rhythm, normal axis. Prior EKG was similar.   ASSESSMENT & PLAN:   Assessment & Plan by Problem: Principal Problem:   Failure to thrive in adult   [name] is a 61 y.o. person living with a PMH of dementia, cirrhosis and hepatitis C previous subarachnoid hemorrhage, previous alcohol abuse who is chronically nonverbal presents to the ED with hypoglycemia and decreased p.o. intake and admitted for failure to thrive on hospital day 0  #Failure to Thrive Patient is cachetic in appearance. Nonverbal at baseline. He only eats when he is fed by someone. Dr. Allena Katz from facility notes poor po intake with worsening in past few weeks. ED nurse fed him some applesauce but concern for swallowing. Will consult SLP.  -NPO  -SLP consult -will reach out to Dr. Allena Katz tomorrow for more information  #Hypoglycemia Patient presented to ED with hypoglycemia in 50s, given dextrose. Continues to be hypoglycemic. Poor po intake per facility with worsening progression past few weeks.  -CBG every 4 hours -D5 at 75 ml/hr  #Hypernatremia Na is 146. Likely from poor po intake. Will monitor.  -repeat BMP  #Cirrhosis Transaminases were elevated on initial BMP. Ammonia elevated, do not see lactulose or rifaximin on medication list. Will confirm with PCP tomorrow.   #Hyperkalemia Elevated potassium on Istat VBG, will repeat stat BMP.   #Pyruria #Trichomonas Infection PCP sent patient to ED with concern for possible UTI. On UA, patient with pyuria, many bacteria, and many WBC. Trichomonas also present.  With patient being non-verbal and not able to communicate with [place], will treat with rocephin.   Diet: NPO VTE: Enoxaparin IVF: D5,75cc/hr Code: Full  Prior to Admission Living Arrangement:  [facility] Anticipated Discharge Location:  [facility]   Dispo: Admit patient to Observation with expected length of stay less than 2 midnights.  Signed: Rana Snare, DO Internal Medicine Resident PGY-1  04/12/2022, 8:42 PM

## 2022-04-12 NOTE — Progress Notes (Signed)
New Admission Note:  Arrival Method: Via stretcher from ED to 60m13 Mental Orientation: Alert & Oriented (nonverbal) Telemetry: CCMD verifed Box-35m13 Assessment: Completed Skin: Refer to flowsheet IV: Left FA Pain: 0/10 (facial) Safety Measures: Safety Fall Prevention Plan discussed with patient. Admission: Completed 5 [place] Orientation: Patient has been orientated to the room, unit and the staff.  Orders have been reviewed and are being implemented. Will continue to monitor the patient. Call light has been placed within reach and bed alarm has been activated.   Aram Candela, RN  Phone Number: [phone]

## 2022-04-12 NOTE — ED Triage Notes (Signed)
[name] from [facility] at [facility] d/t failure to thrive. Pt has not been eating or drinking for awhile, but worse this week. Pt does not have family member, facility trying to state involved. Facility does not know pt's baseline... VSS.

## 2022-04-12 NOTE — Hospital Course (Addendum)
Failure to Thrive Hypoglycemia Patient is thin and cachectic in appearance. Presented to ED with hypoglycemia in the 50s, corrected with dextrose. Spoke with the [facility] of nursing and informed that the past weeks the patient was refusing care, combative and decreased po intake. Dr. Allena Katz is his PCP which has only been about a month. They do not know of any family or point of contact for the patient. Exam shows temporal wasting and dry mucous membrane. IV fluids and nutrition consult during hospital course.   Patient is medically stable for discharge on *** to his nursing facility.   Hx of CVA CT head had motion artifact but no acute processes noted. His med list includes aspirin 325 mg and atorvastatin 40 mg.    Electrolytes abnormalities  Most likely from poor po intake. IV fluids were started to correct electrolytes. Potassium and sodium returned to normal range. ***    Cirrhosis History of HCV Per facility, history of cirrhosis and HCV. Transaminases were elevated on initial BP. Ammonia elevated 79. His medication list does not have lactulose or rifaximin. Lactulose given and he responded ***.    Pyuria Trichomonas infection UA showed leukocytes and bacteria. Trichomonas also present. With patient being nonverbal, unable to ask if having symptoms. Patient was started on Rocephin for *** days and Flagyl for 7-day course.

## 2022-04-12 NOTE — ED Provider Notes (Signed)
[facility] EMERGENCY DEPARTMENT Provider Note   CSN: [id number] Arrival date & time: 04/12/22  1116     History  Chief Complaint  Patient presents with   Failure To Thrive    Jacob Bass is a 60 y.o. male.  HPI 61 year old male who has significant history of dementia, cirrhosis and hepatitis C, previous subarachnoid hemorrhage, previous alcohol abuse who is chronically nonverbal presents with decreased p.o. intake and less responsiveness.  History is initially from EMS but primarily from his doctor who rounds on him at his facility, Dr. Allena Katz.  Has not been eating well over the last few weeks and about 2 weeks ago had a COPD exacerbation.  He was eating a little more after treatment for this which did not require hospitalization.  However this morning and maybe a little bit yesterday he was more confused/less responsive.  Dr. Allena Katz was concerned about UTI versus dehydration and was also hoping to get a head CT due to his previous head bleed history.  Patient is full code but does not have a guardian and family is not involved in his care.  Typically he sits up but does not talk or speak which is baseline.  He does not walk and has chronic deficits.  He will eat if fed but does not feed himself.  Home Medications Prior to Admission medications   Medication Sig Start Date End Date Taking? Authorizing Provider  amLODipine (NORVASC) 2.5 MG tablet Take 1 tablet (2.5 mg total) by mouth daily. 10/20/18   Marinda Elk, MD  aspirin 325 MG tablet Take 1 tablet (325 mg total) by mouth daily. 10/28/18   [name], MD  atorvastatin (LIPITOR) 40 MG tablet Place 1 tablet (40 mg total) into feeding tube daily at 6 PM. 10/20/18   Marinda Elk, MD  cefdinir (OMNICEF) 300 MG capsule Take 1 capsule (300 mg total) by mouth 2 (two) times daily. 02/13/22   Lorre Nick, MD  feeding supplement, ENSURE ENLIVE, (ENSURE ENLIVE) LIQD Take 237 mLs by mouth daily at 3 pm. 10/28/18    [name], MD  folic acid (FOLVITE) 1 MG tablet Take 1 tablet (1 mg total) by mouth daily. 10/28/18   [name], MD  guaiFENesin (MUCINEX) 600 MG 12 hr tablet Take 1 tablet (600 mg total) by mouth 2 (two) times daily. 10/27/18   [name], MD  ipratropium-albuterol (DUONEB) 0.5-2.5 (3) MG/3ML SOLN Take 3 mLs by nebulization every 4 (four) hours as needed. 10/27/18   [name], MD  lisinopril (PRINIVIL,ZESTRIL) 5 MG tablet Take 1 tablet (5 mg total) by mouth daily. 10/27/18 10/27/19  [name], MD  metoprolol tartrate (LOPRESSOR) 25 MG tablet Take 1 tablet (25 mg total) by mouth 2 (two) times daily. 10/20/18   Marinda Elk, MD  pantoprazole (PROTONIX) 40 MG tablet Take 1 tablet (40 mg total) by mouth 2 (two) times daily. 10/20/18   Marinda Elk, MD  tamsulosin (FLOMAX) 0.4 MG CAPS capsule Take 1 capsule (0.4 mg total) by mouth daily after supper. 10/27/18   [name], MD  vitamin B-12 1000 MCG tablet Take 1 tablet (1,000 mcg total) by mouth daily. 10/28/18   [name], MD      Allergies    Penicillins    Review of Systems   Review of Systems  Unable to perform ROS: Patient nonverbal    Physical Exam Updated Vital Signs BP (!) 124/56   Pulse 71   Temp 98 F (36.7 C) (  Rectal)   Resp 19   SpO2 100%  Physical Exam Vitals and nursing note reviewed.  Constitutional:      Appearance: He is well-developed.  HENT:     Head: Normocephalic and atraumatic.  Cardiovascular:     Rate and Rhythm: Normal rate and regular rhythm.     Heart sounds: Normal heart sounds.  Pulmonary:     Effort: Pulmonary effort is normal.     Breath sounds: Normal breath sounds. No wheezing.  Abdominal:     General: There is no distension.     Palpations: Abdomen is soft.  Skin:    General: Skin is warm and dry.  Neurological:     Mental Status: He is alert.     Comments: Patient is alert but nonverbal.  Left-sided hemiparesis.  Spontaneously moving the right upper extremity but only minimally moves the  right lower extremity.  Does not follow commands.     ED Results / Procedures / Treatments   Labs (all labs ordered are listed, but only abnormal results are displayed) Labs Reviewed  COMPREHENSIVE METABOLIC PANEL - Abnormal; Notable for the following components:      Result Value   Sodium 146 (*)    Chloride 122 (*)    CO2 19 (*)    Calcium 8.4 (*)    Albumin 2.3 (*)    AST 88 (*)    ALT 82 (*)    Total Bilirubin 1.8 (*)    All other components within normal limits  CBC WITH DIFFERENTIAL/PLATELET - Abnormal; Notable for the following components:   MCV 101.4 (*)    RDW 16.1 (*)    Platelets 81 (*)    Monocytes Absolute 1.3 (*)    All other components within normal limits  CBG MONITORING, ED - Abnormal; Notable for the following components:   Glucose-Capillary 60 (*)    All other components within normal limits  I-STAT VENOUS BLOOD GAS, ED - Abnormal; Notable for the following components:   pH, Ven 7.464 (*)    pCO2, Ven 31.6 (*)    pO2, Ven 90 (*)    Potassium >8.5 (*)    Calcium, Ion 0.83 (*)    All other components within normal limits  CBG MONITORING, ED - Abnormal; Notable for the following components:   Glucose-Capillary 52 (*)    All other components within normal limits  CBG MONITORING, ED - Abnormal; Notable for the following components:   Glucose-Capillary 69 (*)    All other components within normal limits  URINALYSIS, ROUTINE W REFLEX MICROSCOPIC  AMMONIA    EKG EKG Interpretation  Date/Time:  Thursday April 12 2022 12:39:50 EDT Ventricular Rate:  66 PR Interval:  117 QRS Duration: 91 QT Interval:  436 QTC Calculation: 457 R Axis:   23 Text Interpretation: Sinus rhythm Borderline short PR interval Borderline repolarization abnormality similar to June 2023 Confirmed by Pricilla Loveless [phone]) on 04/12/2022 1:26:04 PM  Radiology CT HEAD WO CONTRAST  Result Date: 04/12/2022 CLINICAL DATA:  Mental status change, failure to thrive EXAM: CT HEAD  WITHOUT CONTRAST TECHNIQUE: Contiguous axial images were obtained from the base of the skull through the vertex without intravenous contrast. RADIATION DOSE REDUCTION: This exam was performed according to the departmental dose-optimization program which includes automated exposure control, adjustment of the mA and/or kV according to patient size and/or use of iterative reconstruction technique. COMPARISON:  02/13/2022 FINDINGS: Evaluation is somewhat limited by motion artifact. Brain: No evidence of acute infarction, hemorrhage, mass, mass  effect, or midline shift. No hydrocephalus or extra-axial fluid collection. Extensive periventricular white matter changes, likely the sequela of severe chronic small vessel ischemic disease. Redemonstrated ex vacuo dilatation of the right-greater-than-left lateral ventricle. Cerebral volume loss is quite advanced for age. Vascular: No hyperdense vessel. Skull: Normal. Negative for fracture or focal lesion. Sinuses/Orbits: No acute finding. Other: The mastoid air cells are well aerated. IMPRESSION: Evaluation is somewhat limited by motion artifact. Within this limitation, no acute intracranial process. Electronically Signed   By: Wiliam Ke M.D.   On: 04/12/2022 14:26   DG Chest Port 1 View  Result Date: 04/12/2022 CLINICAL DATA:  Altered mental status EXAM: PORTABLE CHEST 1 VIEW COMPARISON:  Radiograph 02/13/2022 FINDINGS: The cardiomediastinal silhouette is within normal limits. There is no focal airspace disease. There is no pleural effusion. No pneumothorax. No acute osseous abnormality. Thoracic spondylosis. IMPRESSION: No evidence of acute cardiopulmonary disease. Electronically Signed   By: Caprice Renshaw M.D.   On: 04/12/2022 12:09    Procedures Procedures    Medications Ordered in ED Medications  lactated ringers bolus 1,000 mL (1,000 mLs Intravenous Bolus 04/12/22 1253)  dextrose 50 % solution 25 mL (25 mLs Intravenous Given 04/12/22 1253)    ED Course/  Medical Decision Making/ A&P                           Medical Decision Making Amount and/or Complexity of Data Reviewed Labs: ordered.    Details: Mild hypernatremia and mildly low glucose on fingersticks.  WBC normal. Radiology: ordered and independent interpretation performed.    Details: CT head without head bleed ECG/medicine tests: independent interpretation performed.    Details: No acute ischemia  Risk Prescription drug management.   Patient was hypoglycemic initially, ultimately down to 52.  He was given dextrose and this came up.  Mental status to me seems about the same.  He is able to tolerate applesauce when given though at this point I am concerned about his change in mental status, mild hypernatremia this probably with some dehydration, and poor p.o. intake resulting in hypoglycemia.  He will need to be in the hospital for frequent glycemic checks. Discussed with internal medicine teaching service for admission.         Final Clinical Impression(s) / ED Diagnoses Final diagnoses:  Hypoglycemia    Rx / DC Orders ED Discharge Orders     None         Pricilla Loveless, MD 04/12/22 1534

## 2022-04-13 DIAGNOSIS — I10 Essential (primary) hypertension: Secondary | ICD-10-CM | POA: Diagnosis present

## 2022-04-13 DIAGNOSIS — F0390 Unspecified dementia without behavioral disturbance: Secondary | ICD-10-CM | POA: Diagnosis present

## 2022-04-13 DIAGNOSIS — R54 Age-related physical debility: Secondary | ICD-10-CM | POA: Diagnosis present

## 2022-04-13 DIAGNOSIS — E639 Nutritional deficiency, unspecified: Secondary | ICD-10-CM | POA: Diagnosis not present

## 2022-04-13 DIAGNOSIS — K7460 Unspecified cirrhosis of liver: Secondary | ICD-10-CM | POA: Diagnosis present

## 2022-04-13 DIAGNOSIS — D696 Thrombocytopenia, unspecified: Secondary | ICD-10-CM | POA: Diagnosis present

## 2022-04-13 DIAGNOSIS — R64 Cachexia: Secondary | ICD-10-CM | POA: Diagnosis present

## 2022-04-13 DIAGNOSIS — R627 Adult failure to thrive: Secondary | ICD-10-CM | POA: Diagnosis present

## 2022-04-13 DIAGNOSIS — E870 Hyperosmolality and hypernatremia: Secondary | ICD-10-CM | POA: Diagnosis present

## 2022-04-13 DIAGNOSIS — A599 Trichomoniasis, unspecified: Secondary | ICD-10-CM | POA: Diagnosis present

## 2022-04-13 DIAGNOSIS — Z6827 Body mass index (BMI) 27.0-27.9, adult: Secondary | ICD-10-CM | POA: Diagnosis not present

## 2022-04-13 DIAGNOSIS — N390 Urinary tract infection, site not specified: Secondary | ICD-10-CM | POA: Diagnosis present

## 2022-04-13 DIAGNOSIS — Z8619 Personal history of other infectious and parasitic diseases: Secondary | ICD-10-CM | POA: Diagnosis not present

## 2022-04-13 DIAGNOSIS — K7682 Hepatic encephalopathy: Secondary | ICD-10-CM | POA: Diagnosis present

## 2022-04-13 DIAGNOSIS — Z7982 Long term (current) use of aspirin: Secondary | ICD-10-CM | POA: Diagnosis not present

## 2022-04-13 DIAGNOSIS — E878 Other disorders of electrolyte and fluid balance, not elsewhere classified: Secondary | ICD-10-CM | POA: Diagnosis not present

## 2022-04-13 DIAGNOSIS — E46 Unspecified protein-calorie malnutrition: Secondary | ICD-10-CM | POA: Diagnosis present

## 2022-04-13 DIAGNOSIS — G9349 Other encephalopathy: Secondary | ICD-10-CM | POA: Diagnosis not present

## 2022-04-13 DIAGNOSIS — Z880 Allergy status to penicillin: Secondary | ICD-10-CM | POA: Diagnosis not present

## 2022-04-13 DIAGNOSIS — E162 Hypoglycemia, unspecified: Secondary | ICD-10-CM | POA: Diagnosis present

## 2022-04-13 DIAGNOSIS — E875 Hyperkalemia: Secondary | ICD-10-CM | POA: Diagnosis present

## 2022-04-13 DIAGNOSIS — J449 Chronic obstructive pulmonary disease, unspecified: Secondary | ICD-10-CM | POA: Diagnosis present

## 2022-04-13 DIAGNOSIS — Z8673 Personal history of transient ischemic attack (TIA), and cerebral infarction without residual deficits: Secondary | ICD-10-CM | POA: Diagnosis not present

## 2022-04-13 DIAGNOSIS — K5900 Constipation, unspecified: Secondary | ICD-10-CM | POA: Diagnosis present

## 2022-04-13 LAB — GLUCOSE, CAPILLARY
Glucose-Capillary: 86 mg/dL (ref 70–99)
Glucose-Capillary: 64 mg/dL — ABNORMAL LOW (ref 70–99)
Glucose-Capillary: 127 mg/dL — ABNORMAL HIGH (ref 70–99)
Glucose-Capillary: 83 mg/dL (ref 70–99)
Glucose-Capillary: 81 mg/dL (ref 70–99)
Glucose-Capillary: 77 mg/dL (ref 70–99)

## 2022-04-13 LAB — HIV ANTIBODY (ROUTINE TESTING W REFLEX): HIV Screen 4th Generation wRfx: NONREACTIVE

## 2022-04-13 LAB — BASIC METABOLIC PANEL
Anion gap: 6 (ref 5–15)
BUN: 13 mg/dL (ref 8–23)
CO2: 20 mmol/L — ABNORMAL LOW (ref 22–32)
Calcium: 8.3 mg/dL — ABNORMAL LOW (ref 8.9–10.3)
Chloride: 119 mmol/L — ABNORMAL HIGH (ref 98–111)
Creatinine, Ser: 0.87 mg/dL (ref 0.61–1.24)
GFR, Estimated: >60 mL/min (ref >60)
Glucose, Bld: 83 mg/dL (ref 70–99)
Potassium: 3.7 mmol/L (ref 3.5–5.1)
Sodium: 145 mmol/L (ref 135–145)

## 2022-04-13 MED ORDER — ENSURE ENLIVE PO LIQD
237.0000 mL | Freq: Two times a day (BID) | ORAL | Status: DC
Start: 2022-04-13 — End: 2022-04-14
  Administered 2022-04-13: 237 mL via ORAL

## 2022-04-13 MED ORDER — DEXTROSE IN LACTATED RINGERS 5 % IV SOLN: INTRAVENOUS | Status: AC

## 2022-04-13 MED ORDER — LACTULOSE ENEMA
300.0000 mL | Freq: Once | RECTAL | Status: DC
Start: 2022-04-13 — End: 2022-04-13
  Filled 2022-04-13: qty 300

## 2022-04-13 MED ORDER — ATORVASTATIN CALCIUM 40 MG PO TABS
40.0000 mg | ORAL_TABLET | Freq: Every day | ORAL | Status: DC
  Administered 2022-04-13 – 2022-04-18 (×4): 40 mg via ORAL
  Filled 2022-04-13 (×5): qty 1

## 2022-04-13 MED ORDER — METRONIDAZOLE 500 MG/100ML IV SOLN
500.0000 mg | Freq: Two times a day (BID) | INTRAVENOUS | Status: DC
  Administered 2022-04-13 – 2022-04-15 (×4): 500 mg via INTRAVENOUS
  Filled 2022-04-13 (×5): qty 100

## 2022-04-13 MED ORDER — ADULT MULTIVITAMIN W/MINERALS CH
1.0000 | ORAL_TABLET | Freq: Every day | ORAL | Status: DC
  Administered 2022-04-13 – 2022-04-18 (×4): 1 via ORAL
  Filled 2022-04-13 (×5): qty 1

## 2022-04-13 MED ORDER — DEXTROSE 50 % IV SOLN
INTRAVENOUS | Status: AC
  Administered 2022-04-13: 50 mL
  Filled 2022-04-13: qty 50

## 2022-04-13 MED ORDER — DEXTROSE IN LACTATED RINGERS 5 % IV SOLN
INTRAVENOUS | Status: AC
Start: 2022-04-13 — End: 2022-04-14

## 2022-04-13 MED ORDER — LACTULOSE 10 GM/15ML PO SOLN
20.0000 g | Freq: Once | ORAL | Status: AC
  Administered 2022-04-13: 20 g via ORAL
  Filled 2022-04-13: qty 30

## 2022-04-13 MED ORDER — DEXTROSE IN LACTATED RINGERS 5 % IV SOLN
INTRAVENOUS | Status: AC
Start: 2022-04-13 — End: 2022-04-13

## 2022-04-13 NOTE — Progress Notes (Signed)
HD#0 Subjective:   Summary: [name] is a 61 year old male with PMH of dementia, cirrhosis, hepatitis C, subarachnoid hemorrhage, COPD, history of alcohol abuse who is chronically nonverbal presents with decreased po intake and admitted for failure to thrive.   Overnight Events: none  [name] continues to be nonverbal but will open his eyes to you. Nursing staff reports he at times will answer with one word.   Objective:  Vital signs in last 24 hours: Vitals:   04/12/22 2240 04/13/22 0224 04/13/22 0613 04/13/22 0945  BP:  (!) 144/81 (!) 146/84 134/73  Pulse:  79 70 95  Resp:  17 18 18   Temp:  98.2 F (36.8 C) 98.1 F (36.7 C) 98.4 F (36.9 C)  TempSrc:  Oral Oral Oral  SpO2:  100% 99% 99%  Weight: 61.1 kg      Supplemental O2: Room Air SpO2: 99 %   Physical Exam:  Constitutional: patient is thin and cachetic, in no acute distress HENT: normocephalic atraumatic, mucous membranes dry Neck: supple Cardiovascular: regular rate and rhythm Pulmonary/Chest: normal work of breathing on room air, lungs clear to auscultation bilaterally Abdominal: soft, non-tender, non-distended Neurological: awake but nonverbal Skin: warm and dry   Filed Weights   04/12/22 2240  Weight: 61.1 kg     Intake/Output Summary (Last 24 hours) at 04/13/2022 1517 Last data filed at 04/13/2022 1400 Gross per 24 hour  Intake 724.26 ml  Output 150 ml  Net 574.26 ml   Net IO Since Admission: 558.26 mL [04/13/22 1517]  Pertinent Labs:    Latest Ref Rng & Units 04/12/2022   12:22 PM 04/12/2022   11:41 AM 02/13/2022    7:49 PM  CBC  WBC 4.0 - 10.5 K/uL  7.7  5.3   Hemoglobin 13.0 - 17.0 g/dL 02/15/2022  37.6  28.3   Hematocrit 39.0 - 52.0 % 44.0  44.1  41.7   Platelets 150 - 400 K/uL  81  74        Latest Ref Rng & Units 04/13/2022    7:02 AM 04/12/2022   10:45 PM 04/12/2022   12:22 PM  CMP  Glucose 70 - 99 mg/dL 83  86    BUN 8 - 23 mg/dL 13  13    Creatinine 06/12/2022 - 1.24 mg/dL 7.61  6.07     Sodium 3.71 - 145 mmol/L 145  147  140   Potassium 3.5 - 5.1 mmol/L 3.7  3.8  >8.5   Chloride 98 - 111 mmol/L 119  120    CO2 22 - 32 mmol/L 20  21    Calcium 8.9 - 10.3 mg/dL 8.3  8.4      Imaging: No results found.  Assessment/Plan:   Principal Problem:   Failure to thrive in adult   Patient Summary: Jacob Bass is a 61 y.o. with a pertinent PMH of dementia, cirrhosis, hepatitis C, hx of subarachnoid hemorrhage, previous alcohol abuse, who presented with hypoglycemia, worsening po intake and concern for UTI and admitted for failure to thrive.    Failure to thrive Hypoglycemia Patient is cachectic in appearance. Spoke with the 62 of nursing and informed that the past weeks the patient was refusing care, combative and decreased po intake. Dr. Chiropodist is his PCP which has only been about a month. They do not know of any family or point of contact for the patient. SLP recommends dysphagia 1 diet. CBG showed 64, 127 and 83 this morning.  -TOC consult  -  CBG every 4 hours -D5LR at 75 ml/hr for 8 hours  -dysphagia 1 diet   Hx of CVA CT head had motion artifact but no acute processes noted. His med list includes aspirin 325 mg and atorvastatin 40 mg.   Electrolytes abnormalities  Potassium is in normal range 3.7. Sodium returned to 145.   Cirrhosis History of HCV Per facility, history of cirrhosis and HCV. Transaminases were elevated on initial BP. Ammonia elevated 79. His medication list does not have lactulose or rifaximin.  -lactulose enema   Pyuria Trichomonas infection UA showed UTI along with trichomonas. With patient being nonverbal, we will treat the infections.  -continue rocephin (day 2) -start IV flagyl (day 1/7)  Diet:  Dysphagia 1 IVF: D5,75cc/hr VTE: Enoxaparin Code: Full PT/OT recs: SNF for Subacute PT, none.  Dispo: Anticipated discharge to Nursing Home in 1-2 days pending medically stable.   Rana Snare, DO Internal Medicine Resident  PGY-1 Please contact the on call pager after 5 pm and on weekends at [phone].

## 2022-04-13 NOTE — TOC Progression Note (Addendum)
Transition of Care [facility]) - Initial/Assessment Note    Patient Details  Name: Jacob Bass MRN: [id number] Date of Birth: 1960-09-14  Transition of Care [facility]) CM/SW Contact:    Ralene Bathe, LCSWA Phone Number: 04/13/2022, 3:22 PM  Clinical Narrative:                 CSW received consult for competency/ HCPOA/ next of kin and attempted to meet with the patient at bedside.  The patient was asleep and would not wake. The NT agreed to assist as patient is particular about who who speaks with and the patient would not verbally engage.    CSW contacted the facility to inquire about next of kin/ emergency contacts as the one listed in the system is not the correct number.  The facility reports that they do not have an emergency contact for the patient as the patient does not have any family.  CSW completed a search and called a few numbers listed with no success.  CSW contacted [facility] to assist and is awaiting a determination.   Care team notified.      Patient Goals and CMS Choice        Expected Discharge Plan and Services                                                Prior Living Arrangements/Services                       Activities of Daily Living      Permission Sought/Granted                  Emotional Assessment              Admission diagnosis:  Hypoglycemia [E16.2] Failure to thrive in adult [R62.7] Patient Active Problem List   Diagnosis Date Noted   Failure to thrive in adult 04/12/2022   Hypokalemia    Generalized weakness    Hypomagnesemia    Chronic obstructive pulmonary disease (HCC)    Dysphagia    Fall    Gastric ulcer    Cerebral thrombosis with cerebral infarction 10/13/2018   Cerebral embolism with cerebral infarction 10/13/2018   Subarachnoid hemorrhage 10/13/2018   Intracerebral hemorrhage 10/13/2018   Heme positive stool    Cirrhosis of liver without ascites (HCC)    Chest pain 10/09/2018    Elevated troponin 10/09/2018   EKG abnormalities 10/09/2018   Homelessness 10/09/2018   Blood pressure elevated without history of HTN 10/09/2018   Anemia, macrocytic 10/09/2018   Thrombocytopenia (HCC) 10/09/2018   PCP:  Patient, No Pcp Per Pharmacy:   [id number] [place] Otto, [place] - [address] [facility] [address] [place] [facility] [address] [place] [address] Phone: [phone] Fax: [phone]     Social Determinants of Health (SDOH) Interventions    Readmission Risk Interventions     No data to display

## 2022-04-13 NOTE — Evaluation (Signed)
Physical Therapy Evaluation Patient Details Name: Jacob Bass MRN: [id number] DOB: 09/14/1960 Today's Date: 04/13/2022  History of Present Illness  61 y.o. male who presents to the ED 8/11 from [facility] with decreased p.o. intake.  Dr Allena Katz from the facility reported that pt had not been eating well past few weeks and had a COPD exacerbation about 2 weeks ago. Dr. Allena Katz was concerned that he was more confused and less responsive, possibly from UTI vs dehydration. Pt found to by hypoglycemic, hypernatremic and hyperkalemic. with pyruria and trichomonas infection Hx provided by ED who talked to Dr. Allena Katz from the facility. PMH: dementia, cirrhosis and hepatitis C previous subarachnoid hemorrhage, previous alcohol abuse.  Clinical Impression  Pt from [facility] and limited information provided on PLOF. Pt with ~30% command follow initially which decreases to 0% as he fatigues sitting EoB. Pt does report he uses a wheelchair and exhibits understanding of command to get up to sit in chair, however unable to perform due to increased weakness. Pt does not readily accept assistance when provided for bed mobility or attempt at transfer with pt no longer providing muscle action once assistance is initiated. PT recommending return to SNF. Pt responds "yes" when asked if he was working with therapy. PT recommends resumption of services if this was the case. PT will attempt to work with pt again next week to see if pt more responsive to commands and assistance if he has not returned to facility.        Recommendations for follow up therapy are one component of a multi-disciplinary discharge planning process, led by the attending physician.  Recommendations may be updated based on patient status, additional functional criteria and insurance authorization.  Follow Up Recommendations Skilled nursing-short term rehab (<3 hours/day) Can patient physically be transported by private vehicle: No    Assistance  Recommended at Discharge Frequent or constant Supervision/Assistance  Patient can return home with the following  Two people to help with walking and/or transfers;Two people to help with bathing/dressing/bathroom;Assistance with cooking/housework;Assistance with feeding;Direct supervision/assist for medications management;Direct supervision/assist for financial management;Assist for transportation;Help with stairs or ramp for entrance    Equipment Recommendations None recommended by PT  Recommendations for Other Services  OT consult    Functional Status Assessment Patient has had a recent decline in their functional status and/or demonstrates limited ability to make significant improvements in function in a reasonable and predictable amount of time     Precautions / Restrictions Precautions Precautions: Fall Restrictions Weight Bearing Restrictions: No      Mobility  Bed Mobility Overal bed mobility: Needs Assistance Bed Mobility: Supine to Sit, Sit to Supine     Supine to sit: Total assist, HOB elevated Sit to supine: Total assist   General bed mobility comments: total A for sequencing coming to seated EoB, unable to initiate movement when assist provided, total A to return to bed due to increased fatigue from sitting EoB    Transfers Overall transfer level: Needs assistance Equipment used: 1 person hand held assist Transfers: Sit to/from Stand Sit to Stand: Total assist           General transfer comment: pt pointing to chair and reaching for armrest obviously agreeable to get to chair, however unable to lift hips from bed surface and stops trying when outside assist provided       Balance Overall balance assessment: Needs assistance Sitting-balance support: Feet supported, Single extremity supported Sitting balance-Leahy Scale: Zero Sitting balance - Comments: requires outside  assist to maintain upright varying from minA to total A with fatigue Postural control:  Right lateral lean                                   Pertinent Vitals/Pain Pain Assessment Pain Assessment: Faces Faces Pain Scale: No hurt    Home Living Family/patient expects to be discharged to:: Skilled nursing facility                   Additional Comments: Pt from [facility]    Prior Function Prior Level of Function : Patient poor historian/Family not available             Mobility Comments: pt reports he was getting up to wheelchair, unclear how long since he was able to do that       Extremity/Trunk Assessment   Upper Extremity Assessment Upper Extremity Assessment: LUE deficits/detail LUE Deficits / Details: appears to have L sided involvement from prior subarachnoid hemorrage,finger, wrist, elbow in fixed flexed position    Lower Extremity Assessment Lower Extremity Assessment: Generalized weakness;Difficult to assess due to impaired cognition       Communication   Communication: Expressive difficulties;Receptive difficulties (pt with decreased response to questions, answering appropriately about 30% of the time)  Cognition Arousal/Alertness: Awake/alert Behavior During Therapy: Flat affect, Restless Overall Cognitive Status: History of cognitive impairments - at baseline                                 General Comments: pt very restless, with constant movement, rolling back and forth onto his side and back        General Comments General comments (skin integrity, edema, etc.): VSS on RA,        Assessment/Plan    PT Assessment Patient needs continued PT services  PT Problem List Decreased strength;Decreased range of motion;Decreased activity tolerance;Decreased balance;Decreased mobility;Decreased coordination;Decreased cognition;Decreased safety awareness       PT Treatment Interventions DME instruction;Gait training;Functional mobility training;Therapeutic activities;Therapeutic exercise;Balance  training;Cognitive remediation;Patient/family education    PT Goals (Current goals can be found in the Care Plan section)  Acute Rehab PT Goals Patient Stated Goal: none stated PT Goal Formulation: Patient unable to participate in goal setting Time For Goal Achievement: 04/27/22 Potential to Achieve Goals: Poor    Frequency Min 2X/week        AM-PAC PT "6 Clicks" Mobility  Outcome Measure Help needed turning from your back to your side while in a flat bed without using bedrails?: Total Help needed moving from lying on your back to sitting on the side of a flat bed without using bedrails?: Total Help needed moving to and from a bed to a chair (including a wheelchair)?: Total Help needed standing up from a chair using your arms (e.g., wheelchair or bedside chair)?: Total Help needed to walk in hospital room?: Total Help needed climbing 3-5 steps with a railing? : Total 6 Click Score: 6    End of Session Equipment Utilized During Treatment: Gait belt Activity Tolerance: Patient limited by fatigue (pt able to sit EoB for approximately 8 minutes before fatigue requiring total A to return to bed) Patient left: in bed;with call bell/phone within reach;with bed alarm set Nurse Communication: Mobility status PT Visit Diagnosis: Other abnormalities of gait and mobility (R26.89);Muscle weakness (generalized) (M62.81);Difficulty in walking, not elsewhere classified (R26.2);Unsteadiness  on feet (R26.81);Adult, failure to thrive (R62.7)    Time: 6945-0388 PT Time Calculation (min) (ACUTE ONLY): 29 min   Charges:   PT Evaluation $PT Eval Moderate Complexity: 1 Mod PT Treatments $Therapeutic Activity: 8-22 mins        Dystany Duffy B. Beverely Risen PT, DPT Acute Rehabilitation Services Please use secure chat or  Call Office [phone]   Elon Alas [facility] 04/13/2022, 12:32 PM

## 2022-04-13 NOTE — TOC Progression Note (Signed)
Transition of Care [facility]) - Initial/Assessment Note    Patient Details  Name: Jacob Bass MRN: [id number] Date of Birth: 26-Mar-1961  Transition of Care [facility]) CM/SW Contact:    Ralene Bathe, LCSWA Phone Number: 04/13/2022, 10:24 AM  Clinical Narrative:                 CSW spoke with [place] at [facility].  The patient is LTC at the facility and can return when medically ready.        Patient Goals and CMS Choice        Expected Discharge Plan and Services                                                Prior Living Arrangements/Services                       Activities of Daily Living      Permission Sought/Granted                  Emotional Assessment              Admission diagnosis:  Hypoglycemia [E16.2] Failure to thrive in adult [R62.7] Patient Active Problem List   Diagnosis Date Noted   Failure to thrive in adult 04/12/2022   Hypokalemia    Generalized weakness    Hypomagnesemia    Chronic obstructive pulmonary disease (HCC)    Dysphagia    Fall    Gastric ulcer    Cerebral thrombosis with cerebral infarction 10/13/2018   Cerebral embolism with cerebral infarction 10/13/2018   Subarachnoid hemorrhage 10/13/2018   Intracerebral hemorrhage 10/13/2018   Heme positive stool    Cirrhosis of liver without ascites (HCC)    Chest pain 10/09/2018   Elevated troponin 10/09/2018   EKG abnormalities 10/09/2018   Homelessness 10/09/2018   Blood pressure elevated without history of HTN 10/09/2018   Anemia, macrocytic 10/09/2018   Thrombocytopenia (HCC) 10/09/2018   PCP:  Patient, No Pcp Per Pharmacy:   [id number] [place] Otto, [place] - [address] AT [facility] [address] [address] Phone: [phone] Fax: [phone]     Social Determinants of Health (SDOH) Interventions    Readmission Risk Interventions     No data to display

## 2022-04-13 NOTE — Evaluation (Signed)
Clinical/Bedside Swallow Evaluation Patient Details  Name: Jacob Bass MRN: [id number] Date of Birth: December 31, 1960  Today's Date: 04/13/2022 Time: SLP Start Time (ACUTE ONLY): 0844 SLP Stop Time (ACUTE ONLY): 0910 SLP Time Calculation (min) (ACUTE ONLY): 26 min  Past Medical History:  Past Medical History:  Diagnosis Date   CHF (congestive heart failure) (HCC)    Hypertension    Past Surgical History:  Past Surgical History:  Procedure Laterality Date   BIOPSY  10/19/2018   Procedure: BIOPSY;  Surgeon: Benancio Deeds, MD;  Location: [facility] ENDOSCOPY;  Service: Gastroenterology;;   ESOPHAGOGASTRODUODENOSCOPY (EGD) WITH PROPOFOL N/A 10/19/2018   Procedure: ESOPHAGOGASTRODUODENOSCOPY (EGD);  Surgeon: Benancio Deeds, MD;  Location: [facility] ENDOSCOPY;  Service: Gastroenterology;  Laterality: N/A;   HPI:  Pt is a 61 y.o. male who presents to the ED with decreased p.o. intake. Hx provided by ED who talked to Dr. Allena Katz from the facility. He reported that pt had not been eating well past few weeks and had a COPD exacerbation about 2 weeks ago. Dr. Allena Katz was concerned that he was more confused and less responsive, possibly from UTI vs dehydration. He is also concerned because of pt's hx of subarachnoid hemorrhage. His baseline at the facility is he sits up but is nonverbal. He eats only when someone feeds him. Per RN note (8/10) "Attempted to give pt applesauce.  Pt swallows with no choking but you can hear a gulp as he is swallowing". CT head and CXR (8/10)- negative. MBS (10/27/18) noted sensed "aspiration... which appears to be related to increased volumes and rapidity of eating/swallowing as compared to the smaller volumes he consumed on MBS of 2/13". Dys 1, NTL recommended at that time. PMH: dementia, cirrhosis and hepatitis C previous subarachnoid hemorrhage, previous alcohol abuse.    Assessment / Plan / Recommendation  Clinical Impression  Pt verbally greeting clinician upon arrival and  maintained adequate alertness for completion of swallow evaluation this am. Repositioned pt upright in bed, however he required frequent cueing to maintain this position, often moving around/sliding in bed often with POs in oral cavity. No family present to determine baseline function/diet. Oral mechanism examination revealed likely residual L facial droop from previous CVA, otherwise examination limited due to poor ability in following commands. He is edentulous, stating he wears dentures at baseline. With assist, pt consumed POs, demonstrating x1 overt coughing with cup sip of thin, suspect due to prolonged oral holding and constant movements. Subsequent small cup sips and consecutive swallows by straw were without signs of aspiration. Regular textured solids were significant for prolonged mastication and oral holding. All solid POs cleared from oral cavity independently. Given clinical presentation, mentation and poor maintanence of upright posture, recommend dys 1 diet, thin liquids (no straws). He will require 1:1 supervision for all meals. Upright positioning (monitoring throughout), small bites/sips, slow rate of intake, cues to swallow during oral holding is also recommended. SLP to f/u for tolerance.  SLP Visit Diagnosis: Dysphagia, unspecified (R13.10)    Aspiration Risk  Mild aspiration risk;Moderate aspiration risk    Diet Recommendation Dysphagia 1 (Puree);Thin liquid   Liquid Administration via: Cup;No straw Medication Administration: Crushed with puree Supervision: Staff to assist with self feeding;Full supervision/cueing for compensatory strategies Compensations: Minimize environmental distractions;Slow rate;Small sips/bites (verbal cue to swallow during prolonged oral holding) Postural Changes: Seated upright at 90 degrees    Other  Recommendations Oral Care Recommendations: Oral care BID;Staff/trained caregiver to provide oral care    Recommendations for follow up therapy  are one  component of a multi-disciplinary discharge planning process, led by the attending physician.  Recommendations may be updated based on patient status, additional functional criteria and insurance authorization.  Follow up Recommendations Other (comment) (TBD)      Assistance Recommended at Discharge Frequent or constant Supervision/Assistance  Functional Status Assessment Patient has had a recent decline in their functional status and demonstrates the ability to make significant improvements in function in a reasonable and predictable amount of time.  Frequency and Duration min 2x/week  2 weeks       Prognosis Prognosis for Safe Diet Advancement: Good Barriers to Reach Goals: Cognitive deficits;Time post onset      Swallow Study   General Date of Onset: 04/12/22 HPI: Pt is a 61 y.o. male who presents to the ED with decreased p.o. intake. Hx provided by ED who talked to Dr. Allena Katz from the facility. He reported that pt had not been eating well past few weeks and had a COPD exacerbation about 2 weeks ago. Dr. Allena Katz was concerned that he was more confused and less responsive, possibly from UTI vs dehydration. He is also concerned because of pt's hx of subarachnoid hemorrhage. His baseline at the facility is he sits up but is nonverbal. He eats only when someone feeds him. Per RN note (8/10) "Attempted to give pt applesauce.  Pt swallows with no choking but you can hear a gulp as he is swallowing". CT head and CXR (8/10)- negative. MBS (10/27/18) noted sensed "aspiration... which appears to be related to increased volumes and rapidity of eating/swallowing as compared to the smaller volumes he consumed on MBS of 2/13". Dys 1, NTL recommended at that time. PMH: dementia, cirrhosis and hepatitis C previous subarachnoid hemorrhage, previous alcohol abuse. Type of Study: Bedside Swallow Evaluation Previous Swallow Assessment: see HPI Diet Prior to this Study: NPO Temperature Spikes Noted:  No Respiratory Status: Room air History of Recent Intubation: No Behavior/Cognition: Alert;Cooperative;Pleasant mood;Confused;Requires cueing Oral Cavity Assessment: Dry Oral Cavity - Dentition: Edentulous Vision:  (difficult to assess) Self-Feeding Abilities: Needs assist Patient Positioning: Upright in bed;Postural control interferes with function Baseline Vocal Quality: Normal Volitional Cough: Cognitively unable to elicit Volitional Swallow: Unable to elicit    Oral/Motor/Sensory Function Overall Oral Motor/Sensory Function: Mild impairment Facial Symmetry: Abnormal symmetry left (likely relatred to previous subarachnoid hemorrhage)   Ice Chips Ice chips: Impaired Presentation: Spoon Oral Phase Impairments: Impaired mastication;Reduced lingual movement/coordination Oral Phase Functional Implications: Oral holding;Prolonged oral transit   Thin Liquid Thin Liquid: Impaired Presentation: Cup;Straw Oral Phase Functional Implications: Oral holding;Prolonged oral transit Pharyngeal  Phase Impairments: Suspected delayed Swallow;Cough - Immediate    Nectar Thick Nectar Thick Liquid: Not tested   Honey Thick Honey Thick Liquid: Not tested   Puree Puree: Impaired Presentation: Spoon Oral Phase Impairments: Reduced lingual movement/coordination Oral Phase Functional Implications: Oral holding;Prolonged oral transit   Solid     Solid: Impaired Presentation: Self Fed Oral Phase Impairments: Impaired mastication Oral Phase Functional Implications: Oral holding;Impaired mastication;Prolonged oral transit       Avie Echevaria, MA, CCC-SLP Acute Rehabilitation Services Office Number: [phone]  Paulette Blanch 04/13/2022,9:35 AM

## 2022-04-13 NOTE — Progress Notes (Signed)
Initial Nutrition Assessment  DOCUMENTATION CODES:   Not applicable  INTERVENTION:  Encourage PO intake, continue feeding assistance with all meals Ensure Enlive po BID, each supplement provides 350 kcal and 20 grams of protein. Magic cup TID with meals, each supplement provides 290 kcal and 9 grams of protein MVI with minerals daily  NUTRITION DIAGNOSIS:   Inadequate oral intake related to lethargy/confusion as evidenced by meal completion < 25%.  GOAL:   Patient will meet greater than or equal to 90% of their needs  MONITOR:   PO intake, Supplement acceptance, Labs, Weight trends, Diet advancement, I & O's, Skin  REASON FOR ASSESSMENT:   Consult Assessment of nutrition requirement/status  ASSESSMENT:   Pt admitted with FTT and decreased PO intake. PMH significant for dementia, cirrhosis and hepatitis C, previous SAH, alcohol use, chronically nonverbal.  Pt sitting up in bed. Eating a few bites of applesauce with medication from RN. He responds to some questions asked. He is agreeable to Ensure nutrition supplements.   No family present at bedside to obtain history from. Per review of chart, MD at his facility reports that he has not been eating well for the past few weeks and reported concern for increased confusion and less responsiveness.   Meal completions: 8/11: 0% x2 recorded meals  When asked if he has encountered weight loss, he reports "yes." When asked if RD could perform nutrition-focused physical exam, he reports "I don't have muscles." He only allowed partial NFPE exam.   Unfortunately, there is limited documentation of weight history within the last year. His current admit wt is 61.1 kg. This is consistent with his wt in 2020. Will continue to monitor throughout admission.   Suspect pt with a degree of malnutrition. Given limited nutrition-related history and nutrition focused physical exam, unable to confirm at this time. Will continue to reassess as able.    Medications: protonix, IV abx  Labs: ionized Ca 0.83, AST 88, ALT 82, ammonia 79  NUTRITION - FOCUSED PHYSICAL EXAM:  Flowsheet Row Most Recent Value  Orbital Region Mild depletion  Upper Arm Region Unable to assess  Thoracic and Lumbar Region Mild depletion  Buccal Region No depletion  Temple Region No depletion  Clavicle Bone Region Mild depletion  Clavicle and Acromion Bone Region No depletion  Scapular Bone Region No depletion  Dorsal Hand Unable to assess  Patellar Region Unable to assess  Anterior Thigh Region Unable to assess  Posterior Calf Region Unable to assess  Edema (RD Assessment) None  Hair Reviewed  Eyes Reviewed  Mouth Unable to assess  Skin Reviewed  Nails Unable to assess       Diet Order:   Diet Order             DIET - DYS 1 Room service appropriate? No; Fluid consistency: Thin  Diet effective now                   EDUCATION NEEDS:   No education needs have been identified at this time  Skin:  Skin Assessment: Reviewed RN Assessment  Last BM:  unknown  Height:   Ht Readings from Last 1 Encounters:  12/26/18 5\' 3"  (1.6 m)    Weight:   Wt Readings from Last 1 Encounters:  04/12/22 61.1 kg   BMI:  Body mass index is 23.86 kg/m.  Estimated Nutritional Needs:   Kcal:  1700-1900  Protein:  85-100g  Fluid:  >/=1.7L  06/12/22, RDN, LDN Clinical Nutrition

## 2022-04-14 ENCOUNTER — Inpatient Hospital Stay (HOSPITAL_COMMUNITY): Payer: Medicaid Other

## 2022-04-14 DIAGNOSIS — G9349 Other encephalopathy: Secondary | ICD-10-CM | POA: Diagnosis not present

## 2022-04-14 DIAGNOSIS — E162 Hypoglycemia, unspecified: Secondary | ICD-10-CM | POA: Diagnosis not present

## 2022-04-14 DIAGNOSIS — Z8673 Personal history of transient ischemic attack (TIA), and cerebral infarction without residual deficits: Secondary | ICD-10-CM | POA: Diagnosis not present

## 2022-04-14 DIAGNOSIS — R627 Adult failure to thrive: Secondary | ICD-10-CM | POA: Diagnosis not present

## 2022-04-14 LAB — GLUCOSE, CAPILLARY
Glucose-Capillary: 67 mg/dL — ABNORMAL LOW (ref 70–99)
Glucose-Capillary: 60 mg/dL — ABNORMAL LOW (ref 70–99)
Glucose-Capillary: 89 mg/dL (ref 70–99)
Glucose-Capillary: 101 mg/dL — ABNORMAL HIGH (ref 70–99)
Glucose-Capillary: 101 mg/dL — ABNORMAL HIGH (ref 70–99)
Glucose-Capillary: 109 mg/dL — ABNORMAL HIGH (ref 70–99)
Glucose-Capillary: 67 mg/dL — ABNORMAL LOW (ref 70–99)
Glucose-Capillary: 69 mg/dL — ABNORMAL LOW (ref 70–99)
Glucose-Capillary: 130 mg/dL — ABNORMAL HIGH (ref 70–99)
Glucose-Capillary: 95 mg/dL (ref 70–99)
Glucose-Capillary: 105 mg/dL — ABNORMAL HIGH (ref 70–99)
Glucose-Capillary: 97 mg/dL (ref 70–99)

## 2022-04-14 LAB — BASIC METABOLIC PANEL
Anion gap: 5 (ref 5–15)
BUN: 12 mg/dL (ref 8–23)
CO2: 22 mmol/L (ref 22–32)
Calcium: 8.4 mg/dL — ABNORMAL LOW (ref 8.9–10.3)
Chloride: 122 mmol/L — ABNORMAL HIGH (ref 98–111)
Creatinine, Ser: 0.93 mg/dL (ref 0.61–1.24)
GFR, Estimated: >60 mL/min (ref >60)
Glucose, Bld: 83 mg/dL (ref 70–99)
Potassium: 3.9 mmol/L (ref 3.5–5.1)
Sodium: 149 mmol/L — ABNORMAL HIGH (ref 135–145)

## 2022-04-14 LAB — BLOOD GAS, ARTERIAL
Acid-base deficit: 0.9 mmol/L (ref 0.0–2.0)
Bicarbonate: 21.3 mmol/L (ref 20.0–28.0)
Drawn by: 51185
O2 Saturation: 98.5 %
Patient temperature: 37
pCO2 arterial: 28 mmHg — ABNORMAL LOW (ref 32–48)
pH, Arterial: 7.49 — ABNORMAL HIGH (ref 7.35–7.45)
pO2, Arterial: 87 mmHg (ref 83–108)

## 2022-04-14 LAB — CBC
HCT: 39.5 % (ref 39.0–52.0)
Hemoglobin: 13.4 g/dL (ref 13.0–17.0)
MCH: 34 pg (ref 26.0–34.0)
MCHC: 33.9 g/dL (ref 30.0–36.0)
MCV: 100.3 fL — ABNORMAL HIGH (ref 80.0–100.0)
Platelets: 74 10*3/uL — ABNORMAL LOW (ref 150–400)
RBC: 3.94 MIL/uL — ABNORMAL LOW (ref 4.22–5.81)
RDW: 16.1 % — ABNORMAL HIGH (ref 11.5–15.5)
WBC: 8.1 10*3/uL (ref 4.0–10.5)
nRBC: 0 % (ref 0.0–0.2)

## 2022-04-14 LAB — VITAMIN B12: Vitamin B-12: 1398 pg/mL — ABNORMAL HIGH (ref 180–914)

## 2022-04-14 MED ORDER — DEXTROSE 50 % IV SOLN
1.0000 | Freq: Once | INTRAVENOUS | Status: AC
Start: 2022-04-14 — End: 2022-04-14

## 2022-04-14 MED ORDER — DEXTROSE-NACL 10-0.45 % IV SOLN
INTRAVENOUS | Status: DC
Start: 2022-04-14 — End: 2022-04-17
  Filled 2022-04-14 (×11): qty 1000

## 2022-04-14 MED ORDER — DEXTROSE 50 % IV SOLN
INTRAVENOUS | Status: AC
  Administered 2022-04-14: 50 mL
  Filled 2022-04-14: qty 50

## 2022-04-14 MED ORDER — THIAMINE MONONITRATE 100 MG PO TABS
100.0000 mg | ORAL_TABLET | Freq: Every day | ORAL | Status: DC
  Administered 2022-04-16 – 2022-04-18 (×3): 100 mg via ORAL
  Filled 2022-04-14 (×9): qty 1

## 2022-04-14 MED ORDER — DEXTROSE 50 % IV SOLN
1.0000 | Freq: Once | INTRAVENOUS | Status: AC
  Administered 2022-04-14: 50 mL via INTRAVENOUS
  Filled 2022-04-14: qty 50

## 2022-04-14 MED ORDER — LACTULOSE ENEMA
300.0000 mL | Freq: Every day | ORAL | Status: DC
  Administered 2022-04-14 – 2022-04-15 (×2): 300 mL via RECTAL
  Filled 2022-04-14 (×4): qty 300

## 2022-04-14 MED ORDER — LACTULOSE 10 GM/15ML PO SOLN
20.0000 g | Freq: Two times a day (BID) | ORAL | Status: DC
Start: 2022-04-14 — End: 2022-04-14

## 2022-04-14 MED ORDER — ENSURE ENLIVE PO LIQD
237.0000 mL | Freq: Three times a day (TID) | ORAL | Status: DC
  Administered 2022-04-16 – 2022-04-18 (×6): 237 mL via ORAL

## 2022-04-14 MED ORDER — FOLIC ACID 1 MG PO TABS
1.0000 mg | ORAL_TABLET | Freq: Every day | ORAL | Status: DC
  Administered 2022-04-16 – 2022-04-18 (×3): 1 mg via ORAL
  Filled 2022-04-14 (×4): qty 1

## 2022-04-14 NOTE — Progress Notes (Signed)
Brief Nutrition Follow-up:  Calorie Count ordered by MD. RD initial assessment completed yesterday.  Recorded po intake 100% at dinner last night, 0% at breakfast.  Calorie count instructions placed in orders. Notified RN of calorie count. RD to follow-up and review results of calorie count  Interventions:  Feeding assistance at meal times Increase Ensure Enlive po to TID, each supplement provides 350 kcal and 20 grams of protein. Magic cup TID with meals, each supplement provides 290 kcal and 9 grams of protein Continue MVI with Minerals  Romelle Starcher MS, RDN, LDN, CNSC Registered Dietitian 3 Clinical Nutrition RD Pager and On-Call Pager Number Located in [place]

## 2022-04-14 NOTE — Progress Notes (Signed)
HD#1 Subjective:   Summary: Jacob Bass is a 61 y.o. with a pertinent PMH of dementia, cirrhosis, hepatitis C, hx of subarachnoid hemorrhage, previous alcohol abuse, who presented with hypoglycemia, worsening po intake and concern for UTI and admitted for failure to thrive.  Overnight Events: no acute events overnight   Patient unable to answer questions this morning. Resting in bed.   Objective:  Vital signs in last 24 hours: Vitals:   04/14/22 0500 04/14/22 0902 04/14/22 1146 04/14/22 1817  BP: 139/79 113/65  (!) 151/81  Pulse: 81 76 92 (!) 104  Resp: 18 16 18 18   Temp: 98.8 F (37.1 C) 99 F (37.2 C)  98.6 F (37 C)  TempSrc: Oral Oral  Oral  SpO2: 99% 98% 99% 100%  Weight:       Supplemental O2: Room Air SpO2: 100 %   Physical Exam:  Constitutional: ill appearing, cachetic HENT: mucous membranes dry Neck: supple Cardiovascular: regular rate and rhythm, no m/r/g Pulmonary/Chest: normal work of breathing on room air MSK: normal bulk and tone Neurological: unable to assess with encephalopathy  Skin: warm and dry Psych: non-verbal, baseline  Filed Weights   04/12/22 2240  Weight: 61.1 kg     Intake/Output Summary (Last 24 hours) at 04/14/2022 1947 Last data filed at 04/14/2022 1502 Gross per 24 hour  Intake 787.14 ml  Output 350 ml  Net 437.14 ml   Net IO Since Admission: 1,827.9 mL [04/14/22 1947]  Pertinent Labs:    Latest Ref Rng & Units 04/14/2022    4:07 AM 04/12/2022   12:22 PM 04/12/2022   11:41 AM  CBC  WBC 4.0 - 10.5 K/uL 8.1   7.7   Hemoglobin 13.0 - 17.0 g/dL 06/12/2022  61.9  50.9   Hematocrit 39.0 - 52.0 % 39.5  44.0  44.1   Platelets 150 - 400 K/uL 74   81        Latest Ref Rng & Units 04/14/2022    4:07 AM 04/13/2022    7:02 AM 04/12/2022   10:45 PM  CMP  Glucose 70 - 99 mg/dL 83  83  86   BUN 8 - 23 mg/dL 12  13  13    Creatinine 0.61 - 1.24 mg/dL 06/12/2022   7.12   Sodium 135 - 145 mmol/L 149  145  147   Potassium 3.5 - 5.1 mmol/L  3.9  3.7  3.8   Chloride 98 - 111 mmol/L 122  119  120   CO2 22 - 32 mmol/L 22  20  21    Calcium 8.9 - 10.3 mg/dL 8.4  8.3  8.4     Imaging: DG CHEST PORT 1 VIEW  Result Date: 04/14/2022 CLINICAL DATA:  Progressive wheezing EXAM: PORTABLE CHEST 1 VIEW COMPARISON:  April 12, 2022 FINDINGS: The cardiomediastinal silhouette is normal. No pneumothorax. Mild new opacity in left base. No other acute abnormalities. IMPRESSION: Mild new rounded opacity in left base may represent atelectasis or early infiltrate. No other acute abnormalities or changes identified. Electronically Signed   By: III M.D.   On: 04/14/2022 14:25    Assessment/Plan:   Principal Problem:   Failure to thrive in adult   Patient Summary: Jacob Bass is a 61 y.o. with a pertinent PMH of dementia, cirrhosis, hepatitis C, hx of subarachnoid hemorrhage, previous alcohol abuse, who presented with hypoglycemia, worsening po intake and concern for UTI and admitted for failure to thrive.   Acute encephalopathy  Multifactorial  etiologies in setting of CVA, cirrhosis, failure to thrive, and suspect UTI. Per patient's prior living facility he has steadily been declining; becoming more combative with staff, not eating meals, and having worsening mentation. We will treat reversible causes while working with family to discuss goals of care moving forward. He is currently minimally responsive and does not answer questions. Will transition to NPO with worsening mentation. Was having some agonal breathing today, chest xray with possible new left lower lobe opacity. On ceftriaxone for UTI at this time. If new oxygen requirement or new pulmonary findings on exam, will repeat xray and broaden abx as needed. Location lowers my suspicion for aspiration.   Failure to thrive Hypoglycemia Continues to have hypoglycemic event despite D5LR. Will transition to D10LR, patient unable to tolerate PO diet with worsening encephalopathy.  -TOC  consult  -CBG every 4 hours -D10.45NS continuous 75cc/hr -NPO with worsening mentation and uncertain if he can tolerate PO diet   Hx of CVA CT head had motion artifact but no acute processes noted. His med list includes aspirin 325 mg and atorvastatin 40 mg.    Electrolytes abnormalities  Potassium is in normal range 3.7. Sodium returned to 145.    Cirrhosis History of HCV Large bowel movement today with lactulose enema. Hoping mentation improves with this, will continue daily.    Pyuria Trichomonas infection UA showed UTI along with trichomonas. With patient being nonverbal, we will treat the infections.  -continue rocephin (day 3) -start IV flagyl (day 2/7)   Diet: NPO IVF: D10 45%NS VTE: Enoxaparin Code: Full  Dispo: Anticipated discharge pending further work up and evaluation of medical conditions as per above  Thalia Bloodgood DO Internal Medicine Resident PGY-3 Please contact the on call pager after 5 pm and on weekends at [phone].

## 2022-04-14 NOTE — Progress Notes (Signed)
Hypoglycemic Event  CBG: 67  Treatment: D50 50 mL (25 gm)  Symptoms:  not alert  Follow-up CBG: Time: 1110am  CBG Result: 109  Possible Reasons for Event: Inadequate meal intake  Comments/MD notified: MD made aware, see new orders.    Myrtis Hopping

## 2022-04-14 NOTE — Progress Notes (Signed)
Pt arrived to the unit from . Pt is on tele monitoring and continuous pulse ox. VS WDL although HR is elevated 104-106. Pt is alert; he said a couple of words. Bed is on the lowest position; floor mats are placed; bed alarm is on. Pt is trying to pull out his IV and tele leads. Safety mittens are applied.

## 2022-04-15 DIAGNOSIS — R627 Adult failure to thrive: Secondary | ICD-10-CM | POA: Diagnosis not present

## 2022-04-15 DIAGNOSIS — G9349 Other encephalopathy: Secondary | ICD-10-CM | POA: Diagnosis not present

## 2022-04-15 DIAGNOSIS — Z7189 Other specified counseling: Secondary | ICD-10-CM

## 2022-04-15 DIAGNOSIS — G9341 Metabolic encephalopathy: Secondary | ICD-10-CM | POA: Diagnosis present

## 2022-04-15 DIAGNOSIS — K7460 Unspecified cirrhosis of liver: Secondary | ICD-10-CM | POA: Diagnosis not present

## 2022-04-15 DIAGNOSIS — E162 Hypoglycemia, unspecified: Secondary | ICD-10-CM | POA: Diagnosis not present

## 2022-04-15 DIAGNOSIS — K7682 Hepatic encephalopathy: Secondary | ICD-10-CM | POA: Diagnosis present

## 2022-04-15 LAB — CBC WITH DIFFERENTIAL/PLATELET
Abs Immature Granulocytes: 0.05 10*3/uL (ref 0.00–0.07)
Basophils Absolute: 0.1 10*3/uL (ref 0.0–0.1)
Basophils Relative: 1 %
Eosinophils Absolute: 0.1 10*3/uL (ref 0.0–0.5)
Eosinophils Relative: 1 %
HCT: 33.1 % — ABNORMAL LOW (ref 39.0–52.0)
Hemoglobin: 11.4 g/dL — ABNORMAL LOW (ref 13.0–17.0)
Immature Granulocytes: 1 %
Lymphocytes Relative: 32 %
Lymphs Abs: 3.4 10*3/uL (ref 0.7–4.0)
MCH: 34 pg (ref 26.0–34.0)
MCHC: 34.4 g/dL (ref 30.0–36.0)
MCV: 98.8 fL (ref 80.0–100.0)
Monocytes Absolute: 1.5 10*3/uL — ABNORMAL HIGH (ref 0.1–1.0)
Monocytes Relative: 14 %
Neutro Abs: 5.4 10*3/uL (ref 1.7–7.7)
Neutrophils Relative %: 51 %
Platelets: 61 10*3/uL — ABNORMAL LOW (ref 150–400)
RBC: 3.35 MIL/uL — ABNORMAL LOW (ref 4.22–5.81)
RDW: 16.3 % — ABNORMAL HIGH (ref 11.5–15.5)
WBC: 10.5 10*3/uL (ref 4.0–10.5)
nRBC: 0 % (ref 0.0–0.2)

## 2022-04-15 LAB — GLUCOSE, CAPILLARY
Glucose-Capillary: 106 mg/dL — ABNORMAL HIGH (ref 70–99)
Glucose-Capillary: 80 mg/dL (ref 70–99)
Glucose-Capillary: 88 mg/dL (ref 70–99)
Glucose-Capillary: 92 mg/dL (ref 70–99)
Glucose-Capillary: 93 mg/dL (ref 70–99)
Glucose-Capillary: 96 mg/dL (ref 70–99)

## 2022-04-15 LAB — BASIC METABOLIC PANEL
Anion gap: 3 — ABNORMAL LOW (ref 5–15)
BUN: 10 mg/dL (ref 8–23)
CO2: 23 mmol/L (ref 22–32)
Calcium: 7.9 mg/dL — ABNORMAL LOW (ref 8.9–10.3)
Chloride: 121 mmol/L — ABNORMAL HIGH (ref 98–111)
Creatinine, Ser: 0.97 mg/dL (ref 0.61–1.24)
GFR, Estimated: >60 mL/min (ref >60)
Glucose, Bld: 103 mg/dL — ABNORMAL HIGH (ref 70–99)
Potassium: 3.4 mmol/L — ABNORMAL LOW (ref 3.5–5.1)
Sodium: 147 mmol/L — ABNORMAL HIGH (ref 135–145)

## 2022-04-15 LAB — URINE CULTURE

## 2022-04-15 LAB — MAGNESIUM: Magnesium: 1.6 mg/dL — ABNORMAL LOW (ref 1.7–2.4)

## 2022-04-15 MED ORDER — PANTOPRAZOLE SODIUM 40 MG IV SOLR
40.0000 mg | Freq: Every day | INTRAVENOUS | Status: DC
  Administered 2022-04-15 – 2022-04-17 (×3): 40 mg via INTRAVENOUS
  Filled 2022-04-15 (×3): qty 10

## 2022-04-15 MED ORDER — SODIUM CHLORIDE 0.9 % IV SOLN
1.0000 g | INTRAVENOUS | Status: DC
  Administered 2022-04-15: 1 g via INTRAVENOUS
  Filled 2022-04-15: qty 10

## 2022-04-15 MED ORDER — METRONIDAZOLE 500 MG/100ML IV SOLN
500.0000 mg | Freq: Two times a day (BID) | INTRAVENOUS | Status: DC
  Administered 2022-04-15 – 2022-04-18 (×7): 500 mg via INTRAVENOUS
  Filled 2022-04-15 (×7): qty 100

## 2022-04-15 MED ORDER — MAGNESIUM SULFATE 2 GM/50ML IV SOLN
2.0000 g | Freq: Once | INTRAVENOUS | Status: AC
Start: 2022-04-15 — End: 2022-04-15
  Administered 2022-04-15: 2 g via INTRAVENOUS
  Filled 2022-04-15: qty 50

## 2022-04-15 NOTE — Progress Notes (Signed)
HD#2 Subjective:   Summary: Jacob Bass is a 61 y.o. with a pertinent PMH of dementia, cirrhosis, hepatitis C, hx of subarachnoid hemorrhage, previous alcohol abuse, who presented with hypoglycemia, worsening po intake and concern for UTI and admitted for failure to thrive.   Overnight Events: no acute events overnight  Resting in bed comfortably. He awakens to verbal stimulant and was able to answer some of my questions today. He states he has a cousin in the area [place]. Denies having any other family members; siblings or children.   Objective:  Vital signs in last 24 hours: Vitals:   04/14/22 2148 04/15/22 0455 04/15/22 0719 04/15/22 0900  BP: 120/62 133/75 (!) 114/56   Pulse:   76 76  Resp:  18 18   Temp: 98.7 F (37.1 C) 98.9 F (37.2 C) 98 F (36.7 C)   TempSrc:  Oral Axillary   SpO2: 100% 100% 100% 100%  Weight:       Supplemental O2: Room Air SpO2: 100 %   Physical Exam:  Constitutional: ill appearing, cachetic HENT: mucous membranes dry Neck: supple Cardiovascular: regular rate and rhythm, no m/r/g Pulmonary/Chest: normal work of breathing on room air MSK: normal bulk and tone. No asterixis on examination Neurological: alert and oriented to self Skin: warm and dry Psych: non-verbal, baseline  Filed Weights   04/12/22 2240  Weight: 61.1 kg     Intake/Output Summary (Last 24 hours) at 04/15/2022 1512 Last data filed at 04/15/2022 0920 Gross per 24 hour  Intake 967.5 ml  Output 625 ml  Net 342.5 ml   Net IO Since Admission: 2,170.4 mL [04/15/22 1512]  Pertinent Labs:    Latest Ref Rng & Units 04/15/2022    1:03 AM 04/14/2022    4:07 AM 04/12/2022   12:22 PM  CBC  WBC 4.0 - 10.5 K/uL 10.5  8.1    Hemoglobin 13.0 - 17.0 g/dL 03.5  00.9  38.1   Hematocrit 39.0 - 52.0 % 33.1  39.5  44.0   Platelets 150 - 400 K/uL 61  74         Latest Ref Rng & Units 04/15/2022    1:03 AM 04/14/2022    4:07 AM 04/13/2022    7:02 AM  CMP  Glucose 70 - 99 mg/dL  829  83  83   BUN 8 - 23 mg/dL 10  12  13    Creatinine 0.61 - 1.24 mg/dL  9.37  1.69   Sodium 135 - 145 mmol/L 147  149  145   Potassium 3.5 - 5.1 mmol/L 3.4  3.9  3.7   Chloride 98 - 111 mmol/L 121  122  119   CO2 22 - 32 mmol/L 23  22  20    Calcium 8.9 - 10.3 mg/dL 7.9  8.4  8.3     Imaging: No results found.  Assessment/Plan:   Principal Problem:   Failure to thrive in adult Active Problems:   Hypoglycemia   Encephalopathy, hepatic (HCC)   Goals of care, counseling/discussion  Patient Summary: Jacob Bass is a 61 y.o. with a pertinent PMH of dementia, cirrhosis, hepatitis C, hx of subarachnoid hemorrhage, previous alcohol abuse, who presented with hypoglycemia, worsening po intake and concern for UTI and admitted for failure to thrive.   Acute encephalopathy  Multifactorial etiologies in setting of CVA, cirrhosis, failure to thrive, and suspect UTI. Significant improvement from yesterday after starting D10LR and large bowel movement with lactulose enema. Will continue to treat  UTI and new left lower opacity with ceftriaxone and metronidazole, because opacity new yesterday will restart abx count from yesterday.  Ceftriaxone and metronidazole day 2/7  Failure to thrive Hypoglycemia Hypoglycemia has resolved with D10LR. Will plan for cortrek placement tomorrow. Hopeful with resolution of encephalopathy will start eating again.   -TOC consult  -CBG every 4 hours -D10.45NS continuous 75cc/hr -continue NPO status, convert meds to IV   Hx of CVA CT head had motion artifact but no acute processes noted. His med list includes aspirin 325 mg and atorvastatin 40 mg.    Electrolytes abnormalities  Replete daily   Cirrhosis History of HCV Large bowel movement today with lactulose enema and significant improvement in mentation.    Pyuria Trichomonas infection UA showed UTI along with trichomonas. With patient being nonverbal, we will treat the infections.  -continue  rocephin and flagyl as per above  Advanced plan caring Continue working with social work to reach out to family to assist in care planning moving forward   Diet: NPO IVF: D10 45%NS VTE: Enoxaparin Code: Full  Dispo: Anticipated discharge pending further work up and evaluation of medical conditions as per above  Thalia Bloodgood DO Internal Medicine Resident PGY-3 Please contact the on call pager after 5 pm and on weekends at [phone].

## 2022-04-15 NOTE — [place] FL2 (Signed)
[place] MEDICAID FL2 LEVEL OF CARE SCREENING TOOL     IDENTIFICATION  Patient Name: Jacob Bass Birthdate: November 18, 1960 Sex: male Admission Date (Current Location): 04/12/2022  [place] and [place] Number:  Haynes Bast [id number] Facility and Address:  [facility] [place], [address] [name], [place] [address]      Provider Number: [id number]  Attending Physician Name and Address:  Mercie Eon, MD  Relative Name and Phone Number:       Current Level of Care: Hospital Recommended Level of Care: Skilled Nursing Facility Prior Approval Number:    Date Approved/Denied:   PASRR Number: [id number]  Discharge Plan: SNF    Current Diagnoses: Patient Active Problem List   Diagnosis Date Noted   Failure to thrive in adult 04/12/2022   Hypokalemia    Generalized weakness    Hypomagnesemia    Chronic obstructive pulmonary disease (HCC)    Dysphagia    Fall    Gastric ulcer    Cerebral thrombosis with cerebral infarction 10/13/2018   Cerebral embolism with cerebral infarction 10/13/2018   Subarachnoid hemorrhage 10/13/2018   Intracerebral hemorrhage 10/13/2018   Heme positive stool    Cirrhosis of liver without ascites (HCC)    Chest pain 10/09/2018   Elevated troponin 10/09/2018   EKG abnormalities 10/09/2018   Homelessness 10/09/2018   Blood pressure elevated without history of HTN 10/09/2018   Anemia, macrocytic 10/09/2018   Thrombocytopenia (HCC) 10/09/2018    Orientation RESPIRATION BLADDER Height & Weight     Self  Normal Incontinent Weight: 134 lb 11.2 oz (61.1 kg) Height:     BEHAVIORAL SYMPTOMS/MOOD NEUROLOGICAL BOWEL NUTRITION STATUS      Incontinent Diet (see discharge summary)  AMBULATORY STATUS COMMUNICATION OF NEEDS Skin   Total Care Verbally Normal                       Personal Care Assistance Level of Assistance  Bathing, Feeding, Dressing, Total care Bathing Assistance: Maximum assistance Feeding assistance: Maximum  assistance Dressing Assistance: Maximum assistance Total Care Assistance: Maximum assistance   Functional Limitations Info  Sight, Hearing, Speech Sight Info: Adequate Hearing Info: Adequate Speech Info: Adequate    SPECIAL CARE FACTORS FREQUENCY  PT (By licensed PT), OT (By licensed OT)     PT Frequency: 5x week OT Frequency: 5x week            Contractures Contractures Info: Not present    Additional Factors Info  Code Status, Allergies Code Status Info: full Allergies Info: penicillins           Current Medications (04/15/2022):  This is the current hospital active medication list Current Facility-Administered Medications  Medication Dose Route Frequency Provider Last Rate Last Admin   amLODipine (NORVASC) tablet 2.5 mg  2.5 mg Oral Daily Katsadouros, Vasilios, MD   2.5 mg at 04/13/22 1118   aspirin tablet 325 mg  325 mg Oral Daily Katsadouros, Vasilios, MD   325 mg at 04/13/22 1120   atorvastatin (LIPITOR) tablet 40 mg  40 mg Oral q1800 Rana Snare, DO   40 mg at 04/13/22 1713   cefTRIAXone (ROCEPHIN) 1 g in sodium chloride 0.9 % 100 mL IVPB  1 g Intravenous Q24H Katsadouros, Vasilios, MD 200 mL/hr at 04/14/22 2024 1 g at 04/14/22 2024   dextrose 10 % and 0.45 % NaCl infusion   Intravenous Continuous Katsadouros, Vasilios, MD 75 mL/hr at 04/15/22 0447 New Bag at 04/15/22 0447   enoxaparin (LOVENOX)  injection 40 mg  40 mg Subcutaneous Q24H Katsadouros, Vasilios, MD   40 mg at 04/14/22 2319   feeding supplement (ENSURE ENLIVE / ENSURE PLUS) liquid 237 mL  237 mL Oral TID BM Mercie Eon, MD       folic acid (FOLVITE) tablet 1 mg  1 mg Oral Daily Katsadouros, Vasilios, MD       ipratropium-albuterol (DUONEB) 0.5-2.5 (3) MG/3ML nebulizer solution 3 mL  3 mL Nebulization Q4H PRN Katsadouros, Vasilios, MD   3 mL at 04/14/22 1146   lactulose (CHRONULAC) enema 200 gm  300 mL Rectal Daily Katsadouros, Vasilios, MD   300 mL at 04/14/22 1615   lisinopril (ZESTRIL) tablet 5 mg   5 mg Oral Daily Katsadouros, Vasilios, MD   5 mg at 04/13/22 1120   metoprolol tartrate (LOPRESSOR) tablet 25 mg  25 mg Oral BID Katsadouros, Vasilios, MD   25 mg at 04/13/22 2155   metroNIDAZOLE (FLAGYL) IVPB 500 mg  500 mg Intravenous Q12H Rana Snare, DO 100 mL/hr at 04/15/22 0451 500 mg at 04/15/22 0451   multivitamin with minerals tablet 1 tablet  1 tablet Oral Daily Debe Coder B, MD   1 tablet at 04/13/22 1212   pantoprazole (PROTONIX) injection 40 mg  40 mg Intravenous Daily Katsadouros, Vasilios, MD   40 mg at 04/15/22 0916   tamsulosin (FLOMAX) capsule 0.4 mg  0.4 mg Oral QPC supper Katsadouros, Vasilios, MD   0.4 mg at 04/13/22 1713   thiamine (VITAMIN B1) tablet 100 mg  100 mg Oral Daily Katsadouros, Vasilios, MD         Discharge Medications: Please see discharge summary for a list of discharge medications.  Relevant Imaging Results:  Relevant Lab Results:   Additional Information SS#: [id number]  Lorri Frederick, LCSW

## 2022-04-15 NOTE — Progress Notes (Signed)
Notified Doc that P.O meds not giving due to NPO order

## 2022-04-16 DIAGNOSIS — Z8673 Personal history of transient ischemic attack (TIA), and cerebral infarction without residual deficits: Secondary | ICD-10-CM

## 2022-04-16 DIAGNOSIS — R8281 Pyuria: Secondary | ICD-10-CM

## 2022-04-16 DIAGNOSIS — Z8619 Personal history of other infectious and parasitic diseases: Secondary | ICD-10-CM

## 2022-04-16 DIAGNOSIS — K7682 Hepatic encephalopathy: Secondary | ICD-10-CM | POA: Diagnosis not present

## 2022-04-16 DIAGNOSIS — K7460 Unspecified cirrhosis of liver: Secondary | ICD-10-CM | POA: Diagnosis not present

## 2022-04-16 DIAGNOSIS — E162 Hypoglycemia, unspecified: Secondary | ICD-10-CM

## 2022-04-16 DIAGNOSIS — E878 Other disorders of electrolyte and fluid balance, not elsewhere classified: Secondary | ICD-10-CM

## 2022-04-16 DIAGNOSIS — A5903 Trichomonal cystitis and urethritis: Secondary | ICD-10-CM

## 2022-04-16 DIAGNOSIS — Z515 Encounter for palliative care: Secondary | ICD-10-CM

## 2022-04-16 DIAGNOSIS — F1091 Alcohol use, unspecified, in remission: Secondary | ICD-10-CM

## 2022-04-16 DIAGNOSIS — E639 Nutritional deficiency, unspecified: Secondary | ICD-10-CM

## 2022-04-16 LAB — CBC WITH DIFFERENTIAL/PLATELET
Abs Immature Granulocytes: 0.04 10*3/uL (ref 0.00–0.07)
Basophils Absolute: 0 10*3/uL (ref 0.0–0.1)
Basophils Relative: 1 %
Eosinophils Absolute: 0.2 10*3/uL (ref 0.0–0.5)
Eosinophils Relative: 3 %
HCT: 36.5 % — ABNORMAL LOW (ref 39.0–52.0)
Hemoglobin: 12.3 g/dL — ABNORMAL LOW (ref 13.0–17.0)
Immature Granulocytes: 1 %
Lymphocytes Relative: 28 %
Lymphs Abs: 2 10*3/uL (ref 0.7–4.0)
MCH: 33.9 pg (ref 26.0–34.0)
MCHC: 33.7 g/dL (ref 30.0–36.0)
MCV: 100.6 fL — ABNORMAL HIGH (ref 80.0–100.0)
Monocytes Absolute: 1.6 10*3/uL — ABNORMAL HIGH (ref 0.1–1.0)
Monocytes Relative: 22 %
Neutro Abs: 3.3 10*3/uL (ref 1.7–7.7)
Neutrophils Relative %: 45 %
Platelets: 53 10*3/uL — ABNORMAL LOW (ref 150–400)
RBC: 3.63 MIL/uL — ABNORMAL LOW (ref 4.22–5.81)
RDW: 15.9 % — ABNORMAL HIGH (ref 11.5–15.5)
WBC: 7.1 10*3/uL (ref 4.0–10.5)
nRBC: 0 % (ref 0.0–0.2)

## 2022-04-16 LAB — BASIC METABOLIC PANEL
Anion gap: 5 (ref 5–15)
BUN: 8 mg/dL (ref 8–23)
CO2: 22 mmol/L (ref 22–32)
Calcium: 7.5 mg/dL — ABNORMAL LOW (ref 8.9–10.3)
Chloride: 117 mmol/L — ABNORMAL HIGH (ref 98–111)
Creatinine, Ser: 0.87 mg/dL (ref 0.61–1.24)
GFR, Estimated: >60 mL/min (ref >60)
Glucose, Bld: 92 mg/dL (ref 70–99)
Potassium: 3.5 mmol/L (ref 3.5–5.1)
Sodium: 144 mmol/L (ref 135–145)

## 2022-04-16 LAB — GLUCOSE, CAPILLARY
Glucose-Capillary: 102 mg/dL — ABNORMAL HIGH (ref 70–99)
Glucose-Capillary: 106 mg/dL — ABNORMAL HIGH (ref 70–99)
Glucose-Capillary: 113 mg/dL — ABNORMAL HIGH (ref 70–99)
Glucose-Capillary: 72 mg/dL (ref 70–99)
Glucose-Capillary: 77 mg/dL (ref 70–99)
Glucose-Capillary: 98 mg/dL (ref 70–99)

## 2022-04-16 LAB — MAGNESIUM: Magnesium: 1.8 mg/dL (ref 1.7–2.4)

## 2022-04-16 LAB — PHOSPHORUS: Phosphorus: 2.1 mg/dL — ABNORMAL LOW (ref 2.5–4.6)

## 2022-04-16 MED ORDER — DEXTROSE 50 % IV SOLN
1.0000 | Freq: Once | INTRAVENOUS | Status: AC
  Administered 2022-04-16: 50 mL via INTRAVENOUS
  Filled 2022-04-16: qty 50

## 2022-04-16 MED ORDER — LACTULOSE 10 GM/15ML PO SOLN
20.0000 g | Freq: Every day | ORAL | Status: DC
Start: 2022-04-16 — End: 2022-04-16

## 2022-04-16 MED ORDER — K PHOS MONO-SOD PHOS DI & MONO 155-852-130 MG PO TABS
250.0000 mg | ORAL_TABLET | Freq: Once | ORAL | Status: AC
  Administered 2022-04-16: 250 mg via ORAL
  Filled 2022-04-16: qty 1

## 2022-04-16 MED ORDER — LACTULOSE 10 GM/15ML PO SOLN
20.0000 g | Freq: Two times a day (BID) | ORAL | Status: DC
Start: 2022-04-16 — End: 2022-04-18
  Administered 2022-04-16 – 2022-04-17 (×4): 20 g via ORAL
  Filled 2022-04-16 (×4): qty 30

## 2022-04-16 NOTE — Progress Notes (Signed)
Pt was able to feed his self 50% of lunch meal under nurse supervision and direction. Noted no coughing or signs of aspiration

## 2022-04-16 NOTE — Progress Notes (Addendum)
HD#3 Subjective:   Summary: Jacob Bass is a 61 y.o. with a pertinent PMH of dementia, cirrhosis, hepatitis C, hx of subarachnoid hemorrhage, previous alcohol abuse, who presented with hypoglycemia, worsening po intake and concern for UTI and admitted for hepatic encephalopathy  Overnight Events:  none  Jacob Bass is more alert this morning and responding to questions. He reports he is hungry and has an appetite. Has a cousin named Jacob Bass that visited him about 2-3 months ago. He does not have his contact information.   Objective:  Vital signs in last 24 hours: Vitals:   04/15/22 1706 04/15/22 1948 04/15/22 2347 04/16/22 0330  BP: (!) 158/77 (!) 165/73 (!) 156/61 (!) 146/62  Pulse: 93     Resp: 19 20 16 19   Temp: 97.8 F (36.6 C) 98 F (36.7 C) 98 F (36.7 C) 98.6 F (37 C)  TempSrc: Axillary Oral  Oral  SpO2: 100% 100% 100% 100%  Weight:       Supplemental O2: Room Air SpO2: 100 %   Physical Exam:  Constitutional: awake, patient is thin and cachetic HENT: normocephalic atraumatic Neck: supple Cardiovascular: regular rate and rhythm, no m/r/g Pulmonary/Chest: normal work of breathing on room air MSK: normal bulk and tone Neurological: alert and oriented to self Skin: warm and dry Psych: normal mood and behavior   Filed Weights   04/12/22 2240  Weight: 61.1 kg     Intake/Output Summary (Last 24 hours) at 04/16/2022 0637 Last data filed at 04/16/2022 0600 Gross per 24 hour  Intake 1265 ml  Output 300 ml  Net 965 ml   Net IO Since Admission: 3,095.4 mL [04/16/22 0637]  Pertinent Labs:    Latest Ref Rng & Units 04/16/2022    3:19 AM 04/15/2022    1:03 AM 04/14/2022    4:07 AM  CBC  WBC 4.0 - 10.5 K/uL 7.1  10.5  8.1   Hemoglobin 13.0 - 17.0 g/dL 06/14/2022  19.1  47.8   Hematocrit 39.0 - 52.0 % 36.5  33.1  39.5   Platelets 150 - 400 K/uL 53  61  74        Latest Ref Rng & Units 04/16/2022    3:19 AM 04/15/2022    1:03 AM 04/14/2022    4:07 AM  CMP   Glucose 70 - 99 mg/dL 92  06/14/2022  83   BUN 8 - 23 mg/dL 8  10  12    Creatinine 0.61 - 1.24 mg/dL 621   3.08   Sodium 135 - 145 mmol/L 144  147  149   Potassium 3.5 - 5.1 mmol/L 3.5  3.4  3.9   Chloride 98 - 111 mmol/L 117  121  122   CO2 22 - 32 mmol/L 22  23  22    Calcium 8.9 - 10.3 mg/dL 7.5  7.9  8.4     Imaging: No results found.  Assessment/Plan:   Principal Problem:   Failure to thrive in adult Active Problems:   Hypoglycemia   Encephalopathy, hepatic (HCC)   Goals of care, counseling/discussion  Patient Summary: Jacob Bass is a 61 y.o. with a pertinent PMH of dementia, cirrhosis, hepatitis C, hx of subarachnoid hemorrhage, previous alcohol abuse, who presented with hypoglycemia, worsening po intake and concern for UTI and admitted for acute encephalopathy.   Acute encephalopathy  Multifactorial etiologies in setting of CVA, cirrhosis, poor po intake, constipation, and suspect UTI. Significant improvement in mentation with starting D10LR and lactulose. Had  1 bowel movement yesterday. Will continue to treat UTI and new left lower opacity with ceftriaxone and metronidazole, because opacity new yesterday will restart abx count from yesterday.  -continue ceftriaxone and metronidazole, day 3/7 -continue lactulose po 20 g twice daily  Hypoglycemia Poor nutrition Patient on D10LR, this morning glucose was at 77. Gave 82ml of D50 and glucose improved. He is reporting he is hungry and has an appetite. SLP recommends dysphagia 1 diet. Will trial po intake for now. If no improvement, will consider cortrak placement.  -TOC consult  -CBG every 4 hours -D10.45NS continuous 75cc/hr -ensure supplements  Concern for refeeding syndrome Electrolytes abnormalities  Phosphorus is low at 2.1. Potassium 3.5.  Magnesium 1.8. Will replete if necessary.  -phosphorus 250 mg once -repeat BMP and Phosphorus   Hx of CVA CT head had motion artifact but no acute processes noted. Will  continue home aspirin 325 mg and atorvastatin 40 mg.    Cirrhosis History of HCV History of alcohol use Improvement in mentation after large bowel movement. More alert, able to answer some questions and reports having an appetite.  -continue vitamin B1, folic acid and multivitamin   Pyuria Trichomonas infection UA showed UTI along with trichomonas. With patient being nonverbal on admission, we will treat the infections.  -continue rocephin and flagyl as per above  Advanced plan caring Continue working with social work to reach out to family to assist in care planning moving forward. Patient reports having a cousin, Jacob Bass, that lives in the area.    Diet: dysphagia 1 IVF: D10 0.45%NS, 75 ml/hr VTE: Enoxaparin Code: Full  Dispo: Anticipated discharge pending further work up and evaluation of medical conditions as per above  Rana Snare, DO Internal Medicine Resident PGY-1 Please contact the on call pager after 5 pm and on weekends at [phone].

## 2022-04-16 NOTE — TOC Progression Note (Signed)
Transition of Care [facility]) - Progression Note    Patient Details  Name: Jacob Bass MRN: [id number] Date of Birth: 07/21/1961  [facility]) CM/SW Contact  Lorri Frederick, LCSW Phone Number: 04/16/2022, 10:54 AM  Clinical Narrative:  CSW spoke with [facility].  Pt has been with them since 2020.  His friend Onalee Hua initially signed him in but has not been heard of for a long time.  [facility] did reach out to her this AM, so they are working on the report/need for guardianship.            Expected Discharge Plan and Services                                                 Social Determinants of Health (SDOH) Interventions    Readmission Risk Interventions     No data to display

## 2022-04-16 NOTE — Progress Notes (Signed)
Speech Language Pathology Treatment: Dysphagia  Patient Details Name: Jacob Bass MRN: [id number] DOB: 1961-05-28 Today's Date: 04/16/2022 Time: 8182-9937 SLP Time Calculation (min) (ACUTE ONLY): 17 min  Assessment / Plan / Recommendation Clinical Impression  Pt was seen for f/u after having been made NPO over the weekend by MD, due to worsening AMS. RN reports mentation to be somewhat improved this morning. Although this SLP does not have a baseline to compare, this morning he is alert, responsive to questions although with questionable accuracy, attentive to POs, and following simple commands. SLP provided trials of thin liquids and purees, which he consumed without overt s/s of aspiration, even when allowed to drink at an unregulated rate. He does however still exhibit fluctuating duration of oral holding, at times initiating oral transit swiftly and at other times, SLP counted upwards of 20-30 seconds before he started to propel the bolus to swallow it. Considering his fluctuating mentation, would resume Dys 1 diet and thin liquids but with full supervision during meals to monitor for mentation/alertness and oral clearance in between bites.    HPI HPI: Pt is a 61 y.o. male who presents to the ED with decreased p.o. intake. Hx provided by ED who talked to Dr. Allena Katz from the facility. He reported that pt had not been eating well past few weeks and had a COPD exacerbation about 2 weeks ago. Dr. Allena Katz was concerned that he was more confused and less responsive, possibly from UTI vs dehydration. He is also concerned because of pt's hx of subarachnoid hemorrhage. His baseline at the facility is he sits up but is nonverbal. He eats only when someone feeds him. Per RN note (8/10) "Attempted to give pt applesauce.  Pt swallows with no choking but you can hear a gulp as he is swallowing". CT head and CXR (8/10)- negative. MBS (10/27/18) noted sensed "aspiration... which appears to be related to increased volumes  and rapidity of eating/swallowing as compared to the smaller volumes he consumed on MBS of 2/13". Dys 1, NTL recommended at that time. PMH: dementia, cirrhosis and hepatitis C previous subarachnoid hemorrhage, previous alcohol abuse.      SLP Plan  Continue with current plan of care      Recommendations for follow up therapy are one component of a multi-disciplinary discharge planning process, led by the attending physician.  Recommendations may be updated based on patient status, additional functional criteria and insurance authorization.    Recommendations  Diet recommendations: Dysphagia 1 (puree);Thin liquid Liquids provided via: Cup;Straw Medication Administration: Crushed with puree Supervision: Staff to assist with self feeding;Full supervision/cueing for compensatory strategies Compensations: Minimize environmental distractions;Slow rate;Small sips/bites (cue to swallow PRN) Postural Changes and/or Swallow Maneuvers: Seated upright 90 degrees                Oral Care Recommendations: Oral care BID;Staff/trained caregiver to provide oral care Follow Up Recommendations: Skilled nursing-short term rehab (<3 hours/day) Assistance recommended at discharge: Frequent or constant Supervision/Assistance SLP Visit Diagnosis: Dysphagia, unspecified (R13.10) Plan: Continue with current plan of care           Mahala Menghini., M.A. CCC-SLP Acute Rehabilitation Services Office [phone]  Secure chat preferred   04/16/2022, 1:31 PM

## 2022-04-16 NOTE — TOC Progression Note (Signed)
Transition of Care [facility]) - Progression Note    Patient Details  Name: Jacob Bass MRN: [id number] Date of Birth: 02-04-1961  Transition of [facility]) CM/SW Contact  Bess Kinds, RN Phone Number: [phone] 04/16/2022, 1:01 PM  Clinical Narrative:     Received call from Nash Dimmer, CSW at [facility] requesting update. Ms. Mayford Knife is continuing to assist with search for family. Her contact number is [phone].        Expected Discharge Plan and Services                                                 Social Determinants of Health (SDOH) Interventions    Readmission Risk Interventions     No data to display

## 2022-04-16 NOTE — Progress Notes (Signed)
Nutrition Follow-up  DOCUMENTATION CODES:   Not applicable  INTERVENTION:  Continue diet per SLP Discontinue calorie count Ensure Enlive po TID, each supplement provides 350 kcal and 20 grams of protein. Magic cup TID with meals, each supplement provides 290 kcal and 9 grams of protein MVI with minerals daily Request updated weight  NUTRITION DIAGNOSIS:   Inadequate oral intake related to lethargy/confusion as evidenced by meal completion < 25%.  Ongoing  GOAL:   Patient will meet greater than or equal to 90% of their needs  Progressing  MONITOR:   PO intake, Supplement acceptance, Labs, Weight trends, Diet advancement, I & O's, Skin  REASON FOR ASSESSMENT:   Consult Assessment of nutrition requirement/status  ASSESSMENT:   Pt admitted with FTT and decreased PO intake. PMH significant for dementia, cirrhosis and hepatitis C, previous SAH, alcohol use, chronically nonverbal.  Calorie count ordered to start Saturday however pt began experiencing AMS and was subsequently made NPO. He has had significant improvement with his mentation with addition of D10 , LR and lactulose.   Spoke with patient at bedside. He was more responsive to RD questions today. He reports feeling hungry and being ready to eat. S/p SLP evaluation with recommendation for dysphagia 1 with thin liquids. Will resume nutrition supplements and monitor PO intake.   Per RN documentation, pt ate 50% of his lunch today.   If PO intake continues to be inadequate, could consider placement of Cortrak until GOC can be established.   Last documented weight was 61.1 kg on 8/10. Will request updated weight to review.   Medications: folvite, lactulose, MVI, protonix, thiamine IV drips: abx, D5 and NaCl @ 49ml/hr  Labs: CBG's 77-113 x24 hours  Diet Order:   Diet Order             DIET - DYS 1 Room service appropriate? No; Fluid consistency: Thin  Diet effective now                   EDUCATION NEEDS:    No education needs have been identified at this time  Skin:  Skin Assessment: Reviewed RN Assessment  Last BM:  8/13 (type 4)  Height:   Ht Readings from Last 1 Encounters:  12/26/18 5\' 3"  (1.6 m)    Weight:   Wt Readings from Last 1 Encounters:  04/12/22 61.1 kg   BMI:  Body mass index is 23.86 kg/m.  Estimated Nutritional Needs:   Kcal:  1700-1900  Protein:  85-100g  Fluid:  >/=1.7L  06/12/22, RDN, LDN Clinical Nutrition

## 2022-04-17 DIAGNOSIS — E162 Hypoglycemia, unspecified: Secondary | ICD-10-CM | POA: Diagnosis not present

## 2022-04-17 DIAGNOSIS — E639 Nutritional deficiency, unspecified: Secondary | ICD-10-CM | POA: Diagnosis not present

## 2022-04-17 DIAGNOSIS — K7460 Unspecified cirrhosis of liver: Secondary | ICD-10-CM | POA: Diagnosis not present

## 2022-04-17 DIAGNOSIS — K7682 Hepatic encephalopathy: Secondary | ICD-10-CM | POA: Diagnosis not present

## 2022-04-17 LAB — CBC WITH DIFFERENTIAL/PLATELET
Abs Immature Granulocytes: 0.02 10*3/uL (ref 0.00–0.07)
Basophils Absolute: 0 10*3/uL (ref 0.0–0.1)
Basophils Relative: 1 %
Eosinophils Absolute: 0.2 10*3/uL (ref 0.0–0.5)
Eosinophils Relative: 3 %
HCT: 33.3 % — ABNORMAL LOW (ref 39.0–52.0)
Hemoglobin: 11.1 g/dL — ABNORMAL LOW (ref 13.0–17.0)
Immature Granulocytes: 0 %
Lymphocytes Relative: 32 %
Lymphs Abs: 2.2 10*3/uL (ref 0.7–4.0)
MCH: 33.3 pg (ref 26.0–34.0)
MCHC: 33.3 g/dL (ref 30.0–36.0)
MCV: 100 fL (ref 80.0–100.0)
Monocytes Absolute: 1.1 10*3/uL — ABNORMAL HIGH (ref 0.1–1.0)
Monocytes Relative: 15 %
Neutro Abs: 3.5 10*3/uL (ref 1.7–7.7)
Neutrophils Relative %: 49 %
Platelets: 65 10*3/uL — ABNORMAL LOW (ref 150–400)
RBC: 3.33 MIL/uL — ABNORMAL LOW (ref 4.22–5.81)
RDW: 16 % — ABNORMAL HIGH (ref 11.5–15.5)
WBC: 7 10*3/uL (ref 4.0–10.5)
nRBC: 0 % (ref 0.0–0.2)

## 2022-04-17 LAB — PHOSPHORUS
Phosphorus: 1.9 mg/dL — ABNORMAL LOW (ref 2.5–4.6)
Phosphorus: 2.3 mg/dL — ABNORMAL LOW (ref 2.5–4.6)

## 2022-04-17 LAB — BASIC METABOLIC PANEL
Anion gap: 4 — ABNORMAL LOW (ref 5–15)
BUN: 10 mg/dL (ref 8–23)
CO2: 21 mmol/L — ABNORMAL LOW (ref 22–32)
Calcium: 7.6 mg/dL — ABNORMAL LOW (ref 8.9–10.3)
Chloride: 118 mmol/L — ABNORMAL HIGH (ref 98–111)
Creatinine, Ser: 0.81 mg/dL (ref 0.61–1.24)
GFR, Estimated: >60 mL/min (ref >60)
Glucose, Bld: 119 mg/dL — ABNORMAL HIGH (ref 70–99)
Potassium: 3.6 mmol/L (ref 3.5–5.1)
Sodium: 143 mmol/L (ref 135–145)

## 2022-04-17 LAB — GLUCOSE, CAPILLARY
Glucose-Capillary: 89 mg/dL (ref 70–99)
Glucose-Capillary: 87 mg/dL (ref 70–99)
Glucose-Capillary: 83 mg/dL (ref 70–99)
Glucose-Capillary: 71 mg/dL (ref 70–99)
Glucose-Capillary: 71 mg/dL (ref 70–99)
Glucose-Capillary: 87 mg/dL (ref 70–99)

## 2022-04-17 MED ORDER — PANTOPRAZOLE SODIUM 40 MG PO TBEC: 40.0000 mg | DELAYED_RELEASE_TABLET | Freq: Every day | ORAL | Status: DC

## 2022-04-17 MED ORDER — K PHOS MONO-SOD PHOS DI & MONO 155-852-130 MG PO TABS
500.0000 mg | ORAL_TABLET | Freq: Once | ORAL | Status: AC
  Administered 2022-04-17: 500 mg via ORAL
  Filled 2022-04-17: qty 2

## 2022-04-17 MED ORDER — K PHOS MONO-SOD PHOS DI & MONO 155-852-130 MG PO TABS
500.0000 mg | ORAL_TABLET | Freq: Every day | ORAL | Status: AC
  Administered 2022-04-17 – 2022-04-18 (×2): 500 mg via ORAL
  Filled 2022-04-17 (×2): qty 2

## 2022-04-17 NOTE — TOC Progression Note (Signed)
Transition of Care [facility]) - Progression Note    Patient Details  Name: Jacob Bass MRN: [id number] Date of Birth: [birth year]  [facility]) CM/SW Contact  Lorri Frederick, LCSW Phone Number: 04/17/2022, 11:00 AM  Clinical Narrative:   CSW spoke with [facility] social worker Raven.  She is still in process with her efforts to locate family.  This will need to be complete before they would consider filing for guardianship.  They are not able to sign paperwork in the mean time for a return to SNF or anything else.  She also may need something from psych like [facility] or [facility] at some point.          Expected Discharge Plan and Services                                                 Social Determinants of Health (SDOH) Interventions    Readmission Risk Interventions     No data to display

## 2022-04-17 NOTE — Progress Notes (Addendum)
HD#4 Subjective:   Summary: Jacob Bass is a 61 y.o. with a pertinent PMH of dementia, cirrhosis, hepatitis C, hx of subarachnoid hemorrhage, previous alcohol abuse, who presented with hypoglycemia, worsening po intake and concern for UTI and admitted for hepatic encephalopathy  Overnight Events:  none  Jacob Bass is alert and oriented to self. He reports being hungry and having a large appetite. Noted to have 2 bowel movements today.    Objective:  Vital signs in last 24 hours: Vitals:   04/16/22 2346 04/16/22 2351 04/17/22 0054 04/17/22 0512  BP:  123/61 117/61 (!) 115/54  Pulse:  70 73 65  Resp:  20 19 18   Temp:    98.9 F (37.2 C)  TempSrc:    Oral  SpO2: 100% 98% 99% 95%  Weight:       Supplemental O2: Room Air SpO2: 95 %   Physical Exam:  Constitutional: awake, patient is thin and cachetic HENT: normocephalic atraumatic Neck: supple Cardiovascular: regular rate and rhythm, no m/r/g Pulmonary/Chest: normal work of breathing on room air Neurological: alert and oriented to self only Skin: warm and dry Psych: normal mood and behavior   Filed Weights   04/12/22 2240  Weight: 61.1 kg    No intake or output data in the 24 hours ending 04/17/22 0806  Net IO Since Admission: 3,095.4 mL [04/17/22 0806]  Pertinent Labs:    Latest Ref Rng & Units 04/17/2022    2:34 AM 04/16/2022    3:19 AM 04/15/2022    1:03 AM  CBC  WBC 4.0 - 10.5 K/uL 7.0  7.1  10.5   Hemoglobin 13.0 - 17.0 g/dL 04/17/2022  25.8  52.7   Hematocrit 39.0 - 52.0 % 33.3  36.5  33.1   Platelets 150 - 400 K/uL 65  53  61        Latest Ref Rng & Units 04/17/2022    2:34 AM 04/16/2022    3:19 AM 04/15/2022    1:03 AM  CMP  Glucose 70 - 99 mg/dL 04/17/2022  92  423   BUN 8 - 23 mg/dL 10  8  10    Creatinine 0.61 - 1.24 mg/dL 536   1.44   Sodium 135 - 145 mmol/L 143  144  147   Potassium 3.5 - 5.1 mmol/L 3.6  3.5  3.4   Chloride 98 - 111 mmol/L 118  117  121   CO2 22 - 32 mmol/L 21  22  23    Calcium  8.9 - 10.3 mg/dL 7.6  7.5  7.9     Imaging: No results found.  Assessment/Plan:   Principal Problem:   Encephalopathy, hepatic (HCC) Active Problems:   Thrombocytopenia (HCC)   Cirrhosis of liver without ascites (HCC)   Chronic obstructive pulmonary disease (HCC)   Failure to thrive in adult   Hypoglycemia   Goals of care, counseling/discussion  Patient Summary: Jacob Bass is a 61 y.o. with a pertinent PMH of dementia, cirrhosis, hepatitis C, hx of subarachnoid hemorrhage, previous alcohol abuse, who presented with hypoglycemia, worsening po intake and concern for UTI and admitted for acute encephalopathy.   Acute encephalopathy  Multifactorial etiologies in setting of CVA, cirrhosis, poor po intake, constipation, and suspect UTI. Significant improvement in mentation with IV fluids and lactulose. 2 bowel movements reported today. Treated UTI and left lower lung opacity with 3 day ceftriaxone. Will continue metronidazole for Trich.  -continue metronidazole, day 4/7 -continue lactulose po 20 g twice daily,  goal is 2-3 BM daily, adjust if needed  Hypoglycemia Poor nutrition Patient on D10.45NS and no hypoglycemic episodes overnight. Patient reports having large appetite. Improved po intake. Will stop IV fluids. -CBG every 4 hours -ensure supplements  Concern for refeeding syndrome Electrolytes abnormalities  Phosphorus 1.9, gave 500 mg this morning. Repeat phosphorus was 2.3. Potassium 3.6.  Magnesium 1.8. Continue to monitor.  -K Phos 500 mg for 2 more doses -repeat BMP and replete if necessary  Hx of CVA CT head had motion artifact but no acute processes noted. Will continue home aspirin 325 mg and atorvastatin 40 mg.    Cirrhosis History of HCV History of alcohol use Patient is more alert and verbal today. Oriented to self only still. This might be his baseline. Endorses large appetite and eating his meals.  -continue vitamin B1, folic acid and multivitamin    Pyuria Trichomonas infection UA showed UTI along with trichomonas.  -continue metronidazole per above  Advanced plan caring Continue working with social work to reach out to family to assist in care planning moving forward. Patient reports having a cousin, Jacob Bass, that lives in the area. Unsure if this is true. Appreciate assistance.  -TOC consult   Diet: dysphagia 1 IVF: none, none VTE: Enoxaparin Code: Full PT/OT recs: SNF  Dispo: Anticipated discharge pending further work up and evaluation of medical conditions as per above  Rana Snare, DO Internal Medicine Resident PGY-1 Please contact the on call pager after 5 pm and on weekends at [phone].

## 2022-04-17 NOTE — Progress Notes (Signed)
Physical Therapy Treatment Patient Details Name: Jacob Bass MRN: [id number] DOB: [birth year] Today's Date: 04/17/2022   History of Present Illness 61 y.o. male who presents to the ED 8/11 from [facility] with decreased p.o. intake.  Dr Allena Katz from the facility reported that pt had not been eating well past few weeks and had a COPD exacerbation about 2 weeks ago. Dr. Allena Katz was concerned that he was more confused and less responsive, possibly from UTI vs dehydration. Pt found to by hypoglycemic, hypernatremic and hyperkalemic. with pyruria and trichomonas infection Hx provided by ED who talked to Dr. Allena Katz from the facility. PMH: dementia, cirrhosis and hepatitis C previous subarachnoid hemorrhage, previous alcohol abuse.    PT Comments    Pt requiring mostly total assistance for rolling (able to do once with max assist). Deferred further mobility progressions today. Pt also received PROM for restricted muscles in B LE's. Pt with poor rehab prognosis.   Recommendations for follow up therapy are one component of a multi-disciplinary discharge planning process, led by the attending physician.  Recommendations may be updated based on patient status, additional functional criteria and insurance authorization.  Follow Up Recommendations  Skilled nursing-short term rehab (<3 hours/day) Can patient physically be transported by private vehicle: No   Assistance Recommended at Discharge Frequent or constant Supervision/Assistance  Patient can return home with the following Two people to help with walking and/or transfers;Two people to help with bathing/dressing/bathroom;Assistance with cooking/housework;Assistance with feeding;Direct supervision/assist for medications management;Direct supervision/assist for financial management;Assist for transportation;Help with stairs or ramp for entrance   Equipment Recommendations  None recommended by PT    Recommendations for Other Services OT consult      Precautions / Restrictions Precautions Precautions: Fall Restrictions Weight Bearing Restrictions: No     Mobility  Bed Mobility Overal bed mobility: Needs Assistance Bed Mobility: Rolling Rolling: Total assist, Max assist         General bed mobility comments: Pt performed four consecutive rolls to L side with max assist on first rep and total assistance on following three reps. Therapist placed pt's R hand on L bed rail each attempt but unsuccessful holding 3/4 times (only able on first rep).    Transfers                   General transfer comment: deferred/unable    Ambulation/Gait                   Stairs             Wheelchair Mobility    Modified Rankin (Stroke Patients Only)       Balance Overall balance assessment: Needs assistance Sitting-balance support: Feet supported, Single extremity supported Sitting balance-Leahy Scale: Zero Sitting balance - Comments: requires outside assist to maintain upright varying from minA to total A with fatigue Postural control: Right lateral lean                                  Cognition Arousal/Alertness: Awake/alert Behavior During Therapy: Flat affect, Restless Overall Cognitive Status: History of cognitive impairments - at baseline                                 General Comments: Pt oriented to self and place. Pt with poor communication abilities (mostly non-verbal). Follows commands inconsistently.  Exercises General Exercises - Lower Extremity Ankle Circles/Pumps: Right, Left (PROM ankle DF) Quad Sets: Right, Left (knee extension PROM) Hip Flexion/Marching: Right, Left (hip flexion PROM)    General Comments General comments (skin integrity, edema, etc.): VSS on RA      Pertinent Vitals/Pain Pain Assessment Pain Assessment:  (unable to assess)    Home Living                          Prior Function            PT Goals (current  goals can now be found in the care plan section) Acute Rehab PT Goals Patient Stated Goal: none stated PT Goal Formulation: Patient unable to participate in goal setting Time For Goal Achievement: 04/27/22 Potential to Achieve Goals: Poor Progress towards PT goals: Not progressing toward goals - comment (very limited)    Frequency    Min 2X/week      PT Plan Current plan remains appropriate    Co-evaluation              AM-PAC PT "6 Clicks" Mobility   Outcome Measure  Help needed turning from your back to your side while in a flat bed without using bedrails?: Total Help needed moving from lying on your back to sitting on the side of a flat bed without using bedrails?: Total Help needed moving to and from a bed to a chair (including a wheelchair)?: Total Help needed standing up from a chair using your arms (e.g., wheelchair or bedside chair)?: Total Help needed to walk in hospital room?: Total Help needed climbing 3-5 steps with a railing? : Total 6 Click Score: 6    End of Session   Activity Tolerance: Other (comment) (limited by cognitive status, weakness, and safety concerns (no tech available)) Patient left: in bed;with call bell/phone within reach;with bed alarm set Nurse Communication: Mobility status PT Visit Diagnosis: Other abnormalities of gait and mobility (R26.89);Muscle weakness (generalized) (M62.81);Difficulty in walking, not elsewhere classified (R26.2);Unsteadiness on feet (R26.81);Adult, failure to thrive (R62.7)     Time: 2094-7096 PT Time Calculation (min) (ACUTE ONLY): 11 min  Charges:  $Therapeutic Activity: 8-22 mins                     Tana Coast, PT    [employer] 04/17/2022, 11:39 AM

## 2022-04-17 NOTE — Plan of Care (Signed)
  Problem: Clinical Measurements: Goal: Ability to maintain clinical measurements within normal limits will improve Outcome: Progressing Goal: Will remain free from infection Outcome: Progressing   

## 2022-04-17 NOTE — Progress Notes (Signed)
Speech Language Pathology Treatment: Dysphagia  Patient Details Name: Jacob Bass MRN: [id number] DOB: 08-May-1961 Today's Date: 04/17/2022 Time: 5809-9833 SLP Time Calculation (min) (ACUTE ONLY): 15 min  Assessment / Plan / Recommendation Clinical Impression  Jacob Bass was alert, smiling/laughing, answering questions about his basic needs and comfort. He is likely nearing his baseline with regard to swallowing.  It is characterized by intermittent delays, oral holding, occasional delayed cough, belching - overall, he appears to be protecting airway reasonably well. Swallowing behaviors are consistent with dementia-based dysphagia. Recommend continuing pureed/dysphagia 1 diet during his admission.  He will need help with feeding himself.   No further acute care SLP f/u is necessary - swallowing is likely at baseline.  Our service will sign off.   HPI HPI: Pt is a 61 y.o. male who presents to the ED with decreased p.o. intake. Hx provided by ED who talked to Jacob Bass from the facility. He reported that pt had not been eating well past few weeks and had a COPD exacerbation about 2 weeks ago. Jacob Bass was concerned that he was more confused and less responsive, possibly from UTI vs dehydration. He is also concerned because of pt's hx of subarachnoid hemorrhage. His baseline at the facility is he sits up but is nonverbal. He eats only when someone feeds him. Per RN note (8/10) "Attempted to give pt applesauce.  Pt swallows with no choking but you can hear a gulp as he is swallowing". CT head and CXR (8/10)- negative. MBS (10/27/18) noted sensed "aspiration... which appears to be related to increased volumes and rapidity of eating/swallowing as compared to the smaller volumes he consumed on MBS of 2/13". Dys 1, NTL recommended at that time. PMH: dementia, cirrhosis and hepatitis C previous subarachnoid hemorrhage, previous alcohol abuse.      SLP Plan  All goals met      Recommendations for follow up  therapy are one component of a multi-disciplinary discharge planning process, led by the attending physician.  Recommendations may be updated based on patient status, additional functional criteria and insurance authorization.    Recommendations  Diet recommendations: Dysphagia 1 (puree);Thin liquid Liquids provided via: Cup;Straw Medication Administration: Crushed with puree Supervision: Staff to assist with self feeding;Full supervision/cueing for compensatory strategies Compensations: Minimize environmental distractions;Slow rate;Small sips/bites                Oral Care Recommendations: Oral care BID;Staff/trained caregiver to provide oral care Follow Up Recommendations: Skilled nursing-short term rehab (<3 hours/day) Assistance recommended at discharge: Frequent or constant Supervision/Assistance SLP Visit Diagnosis: Dysphagia, unspecified (R13.10) Plan: All goals met         Jacob Bass L. Tivis Ringer, MA CCC/SLP Clinical Specialist - Acute Care SLP Acute Rehabilitation Services Office number [phone]   Jacob Bass  04/17/2022, 11:19 AM

## 2022-04-17 NOTE — Plan of Care (Signed)
  Problem: Education: Goal: Knowledge of General Education information will improve Description: Including pain rating scale, medication(s)/side effects and non-pharmacologic comfort measures Outcome: Progressing   Problem: Health Behavior/Discharge Planning: Goal: Ability to manage health-related needs will improve Outcome: Progressing   Problem: Clinical Measurements: Goal: Will remain free from infection Outcome: Progressing   

## 2022-04-18 DIAGNOSIS — K7460 Unspecified cirrhosis of liver: Secondary | ICD-10-CM | POA: Diagnosis not present

## 2022-04-18 DIAGNOSIS — G9349 Other encephalopathy: Secondary | ICD-10-CM | POA: Diagnosis not present

## 2022-04-18 DIAGNOSIS — E46 Unspecified protein-calorie malnutrition: Secondary | ICD-10-CM | POA: Diagnosis not present

## 2022-04-18 DIAGNOSIS — E878 Other disorders of electrolyte and fluid balance, not elsewhere classified: Secondary | ICD-10-CM | POA: Diagnosis not present

## 2022-04-18 LAB — BASIC METABOLIC PANEL
Anion gap: 3 — ABNORMAL LOW (ref 5–15)
BUN: 12 mg/dL (ref 8–23)
CO2: 22 mmol/L (ref 22–32)
Calcium: 7.7 mg/dL — ABNORMAL LOW (ref 8.9–10.3)
Chloride: 117 mmol/L — ABNORMAL HIGH (ref 98–111)
Creatinine, Ser: 0.79 mg/dL (ref 0.61–1.24)
GFR, Estimated: >60 mL/min (ref >60)
Glucose, Bld: 107 mg/dL — ABNORMAL HIGH (ref 70–99)
Potassium: 3.7 mmol/L (ref 3.5–5.1)
Sodium: 142 mmol/L (ref 135–145)

## 2022-04-18 LAB — GLUCOSE, CAPILLARY
Glucose-Capillary: 117 mg/dL — ABNORMAL HIGH (ref 70–99)
Glucose-Capillary: 51 mg/dL — ABNORMAL LOW (ref 70–99)
Glucose-Capillary: 85 mg/dL (ref 70–99)
Glucose-Capillary: 86 mg/dL (ref 70–99)
Glucose-Capillary: 87 mg/dL (ref 70–99)
Glucose-Capillary: 91 mg/dL (ref 70–99)

## 2022-04-18 LAB — MAGNESIUM: Magnesium: 1.8 mg/dL (ref 1.7–2.4)

## 2022-04-18 LAB — PHOSPHORUS: Phosphorus: 3.1 mg/dL (ref 2.5–4.6)

## 2022-04-18 MED ORDER — DEXTROSE 50 % IV SOLN
1.0000 | Freq: Once | INTRAVENOUS | Status: AC
  Administered 2022-04-18: 50 mL via INTRAVENOUS

## 2022-04-18 MED ORDER — LACTULOSE 10 GM/15ML PO SOLN: 20.0000 g | Freq: Every day | ORAL | 0 refills | Status: DC

## 2022-04-18 MED ORDER — METRONIDAZOLE 500 MG/100ML IV SOLN: 500.0000 mg | Freq: Two times a day (BID) | INTRAVENOUS | 0 refills | Status: AC

## 2022-04-18 MED ORDER — DEXTROSE 50 % IV SOLN: 50.0000 mL | Freq: Once | INTRAVENOUS | Status: DC

## 2022-04-18 MED ORDER — LACTULOSE 10 GM/15ML PO SOLN
20.0000 g | Freq: Every day | ORAL | Status: DC
  Administered 2022-04-18: 20 g via ORAL
  Filled 2022-04-18: qty 30

## 2022-04-18 MED ORDER — ATORVASTATIN CALCIUM 40 MG PO TABS: 40.0000 mg | ORAL_TABLET | Freq: Every day | ORAL | 0 refills | Status: DC

## 2022-04-18 MED ORDER — DEXTROSE 50 % IV SOLN
INTRAVENOUS | Status: AC
  Filled 2022-04-18: qty 50

## 2022-04-18 NOTE — Discharge Instructions (Addendum)
You were hospitalized for poor nutrition, altered mental status and concern for UTI. Your mentation improved back to baseline with lactulose and IV fluids. You were noted to have improved appetite and more alert/responsive. Please continue improving your oral intake when you return to [facility]. Thank you for allowing [place] to be part of your care.   Please note these changes made to your medications:  Please START taking:  -Lactulose 20 g daily for regular bowel movements -Metronidazole 500 mg twice daily for next 2 days   Please call our clinic if you have any questions or concerns, we may be able to help and keep you from a long and expensive emergency room wait. Our clinic and after hours phone number is [phone], the best time to call is Monday through Friday 9 am to 4 pm but there is always someone available 24/7 if you have an emergency.

## 2022-04-18 NOTE — Plan of Care (Addendum)
Patient blood sugar 51 @1637 . Notified Dr. . New order half D%, will reassess. Patient asymptomatic. 15 minute recheck 117.   Attempt to call [facility] x2, no luck will try again at discharge.  Problem: Education: Goal: Knowledge of General Education information will improve Description: Including pain rating scale, medication(s)/side effects and non-pharmacologic comfort measures Outcome: Adequate for Discharge   Problem: Health Behavior/Discharge Planning: Goal: Ability to manage health-related needs will improve Outcome: Adequate for Discharge   Problem: Clinical Measurements: Goal: Ability to maintain clinical measurements within normal limits will improve Outcome: Adequate for Discharge Goal: Will remain free from infection Outcome: Adequate for Discharge Goal: Diagnostic test results will improve Outcome: Adequate for Discharge Goal: Respiratory complications will improve Outcome: Adequate for Discharge Goal: Cardiovascular complication will be avoided Outcome: Adequate for Discharge   Problem: Activity: Goal: Risk for activity intolerance will decrease Outcome: Adequate for Discharge   Problem: Nutrition: Goal: Adequate nutrition will be maintained Outcome: Adequate for Discharge   Problem: Coping: Goal: Level of anxiety will decrease Outcome: Adequate for Discharge   Problem: Elimination: Goal: Will not experience complications related to bowel motility Outcome: Adequate for Discharge Goal: Will not experience complications related to urinary retention Outcome: Adequate for Discharge   Problem: Pain Managment: Goal: General experience of comfort will improve Outcome: Adequate for Discharge   Problem: Safety: Goal: Ability to remain free from injury will improve Outcome: Adequate for Discharge   Problem: Skin Integrity: Goal: Risk for impaired skin integrity will decrease Outcome: Adequate for Discharge

## 2022-04-18 NOTE — Discharge Summary (Signed)
Name: Jacob Bass MRN: [id number] DOB: 08/22/1961 61 y.o. PCP: Patient, No Pcp Per  Date of Admission: 04/12/2022 11:16 AM Date of Discharge: 04/18/2022 Attending Physician: Dr. Cleda Daub  Discharge Diagnosis: Principal Problem:   Encephalopathy, hepatic (HCC) Active Problems:   Thrombocytopenia (HCC)   Cirrhosis of liver without ascites (HCC)   Chronic obstructive pulmonary disease (HCC)   Failure to thrive in adult   Hypoglycemia   Goals of care, counseling/discussion    Discharge Medications: Allergies as of 04/18/2022       Reactions   Penicillins Hives        Medication List     TAKE these medications    acetaminophen 500 MG tablet Commonly known as: TYLENOL Take 1,000 mg by mouth every 8 (eight) hours as needed for moderate pain (severe pain).   albuterol 108 (90 Base) MCG/ACT inhaler Commonly known as: VENTOLIN HFA Inhale 2 puffs into the lungs every 4 (four) hours as needed for wheezing or shortness of breath.   amLODipine 2.5 MG tablet Commonly known as: NORVASC Take 1 tablet (2.5 mg total) by mouth daily.   atorvastatin 40 MG tablet Commonly known as: LIPITOR Take 1 tablet (40 mg total) by mouth daily at 6 PM. What changed: how to take this   cyanocobalamin 1000 MCG tablet Take 1 tablet (1,000 mcg total) by mouth daily.   feeding supplement Liqd Take 237 mLs by mouth daily at 3 pm.   fluticasone-salmeterol 250-50 MCG/ACT Aepb Commonly known as: ADVAIR Inhale 1 puff into the lungs in the morning and at bedtime.   folic acid 1 MG tablet Commonly known as: FOLVITE Take 1 tablet (1 mg total) by mouth daily.   ipratropium-albuterol 0.5-2.5 (3) MG/3ML Soln Commonly known as: DUONEB Take 3 mLs by nebulization every 4 (four) hours as needed. What changed:  when to take this reasons to take this   lactulose 10 GM/15ML solution Commonly known as: CHRONULAC Take 30 mLs (20 g total) by mouth daily. Start taking on: April 19, 2022    lisinopril 5 MG tablet Commonly known as: ZESTRIL Take 1 tablet (5 mg total) by mouth daily.   magnesium oxide 400 (240 Mg) MG tablet Commonly known as: MAG-OX Take 400 mg by mouth 3 (three) times daily.   melatonin 5 MG Tabs Take 5 mg by mouth at bedtime.   metoprolol tartrate 25 MG tablet Commonly known as: LOPRESSOR Take 1 tablet (25 mg total) by mouth 2 (two) times daily.   metroNIDAZOLE 500 MG/100ML Commonly known as: FLAGYL Inject 100 mLs (500 mg total) into the vein every 12 (twelve) hours for 2 days.   naproxen sodium 220 MG tablet Commonly known as: ALEVE Take 220 mg by mouth every 12 (twelve) hours as needed (mild pain).   pantoprazole 40 MG tablet Commonly known as: PROTONIX Take 1 tablet (40 mg total) by mouth 2 (two) times daily. What changed: when to take this   tamsulosin 0.4 MG Caps capsule Commonly known as: FLOMAX Take 1 capsule (0.4 mg total) by mouth daily after supper. What changed: when to take this   tiotropium 18 MCG inhalation capsule Commonly known as: SPIRIVA Place 18 mcg into inhaler and inhale daily.        Disposition and follow-up:   [name] was discharged from [facility] in Good condition.  At the hospital follow up visit please address:  1.  Follow-up:  a. Hepatic Encephalopathy: patient taking lactulose daily, goal of 2-3 bowel movements  daily, titrate up if needed or consider Rifaximin     b. Malnutrition: patient is taking adequate oral intake   c. Refeeding Syndrome: check BMP, magnesium and phosphorus   d. UTI & Trichomonas: check if patient completed metronidazole 7 day course  2.  Labs / imaging needed at time of follow-up: BMP, magnesium and  phosphorus levels  3.  Pending labs/ test needing follow-up: none  4.  Medication Changes  Metronidazole 500 mg twice daily End Date: 04/20/22 Lactulose 20 g daily (goal of 2-3 bowel movements)  Follow-up Appointments: Patient is returning back to  his SNF.   Hospital Course by problem list: Acute encephalopathy Patient is thin and cachectic in appearance. Initially presented to ED with hypoglycemia in the 50s and non-verbal. Informed by his facility that he was refusing care, combative and decreased po intake the past few weeks.  Started him on IV fluids and lactulose. Patient had large bowel movement and subsequently his mentation improved. He was more alert and responsive. Over the course, he was on pureed diet and tolerated it well. No hypoglycemic episodes after improved nutrition status. Encephalopathy likely multifactorial in setting of history of CVA, cirrhosis, poor po intake, constipation and UTI. CXR showed left lower lung opacity. 3-day course of ceftriaxone for UTI and lung opacity. Patient most likely will need daily lactulose. Goal is 2-3 bowel movements with daily lactulose. If needed at SNF, can titrate lactulose or consider rifaximin. Patient returned to baseline status and stable for discharge back to his SNF.   Malnutrition Concern for refeeding syndrome Electrolytes abnormalities  Hypomagnesemia and hypophosphatemia noted during hospital course. Per facility, poor po intake in past few weeks. Appetite improved over hospital course and able to eat pureed foods. Replenished magnesium and phosphorus, repeat levels were in normal range.    Hx of CVA CT Head showed motion artifact but no acute processes noted. He was continued on aspirin 325 mg and atorvastatin 40 mg.    Cirrhosis History of HCV History of alcohol use Per facility, history of cirrhosis and HCV. Transaminases were elevated on initial BP. Ammonia elevated 79. He was started on lactulose and started to have regular bowel movements. Mentation improved and was more verbal. Most likely will need daily lactulose. Goal is 2-3 bowel movements with daily lactulose. If needed at SNF, can titrate lactulose or consider rifaximin.   Pyuria Trichomonas infection UA showed  UTI along with trichomonas. Patient received ceftriaxone for 3 days and will finish metronidazole for 7-day course.     Discharge Subjective: Patient was alert and responsive this morning. Oriented to only self still. Had several bowel movements yesterday. He reports having a good appetite and improved po intake.   Discharge Exam:   BP (!) 140/82 (BP Location: Right Arm)   Pulse (!) 59   Temp (!) 97.5 F (36.4 C) (Oral)   Resp 17   Wt 69.6 kg   SpO2 98%   BMI 27.18 kg/m  Constitutional: alert, patient is thin appearing HENT: normocephalic atraumatic Neck: supple Cardiovascular: regular rate and rhythm Pulmonary/Chest: normal work of breathing on room air Neurological: alert & oriented x 1 to self Skin: warm and dry Psych: normal mood and behavior   Pertinent Labs, Studies, and Procedures:     Latest Ref Rng & Units 04/17/2022    2:34 AM 04/16/2022    3:19 AM 04/15/2022    1:03 AM  CBC  WBC 4.0 - 10.5 K/uL 7.0  7.1  10.5   Hemoglobin 13.0 -  17.0 g/dL 02.5  42.7  06.2   Hematocrit 39.0 - 52.0 % 33.3  36.5  33.1   Platelets 150 - 400 K/uL 65  53  61        Latest Ref Rng & Units 04/18/2022    3:22 AM 04/17/2022    2:34 AM 04/16/2022    3:19 AM  CMP  Glucose 70 - 99 mg/dL 376  283  92   BUN 8 - 23 mg/dL 12  10  8    Creatinine 0.61 - 1.24 mg/dL  1.51  7.61   Sodium 135 - 145 mmol/L 142  143  144   Potassium 3.5 - 5.1 mmol/L 3.7  3.6  3.5   Chloride 98 - 111 mmol/L 117  118  117   CO2 22 - 32 mmol/L 22  21  22    Calcium 8.9 - 10.3 mg/dL 7.7  7.6  7.5     CT HEAD WO CONTRAST  Result Date: 04/12/2022 CLINICAL DATA:  Mental status change, failure to thrive EXAM: CT HEAD WITHOUT CONTRAST TECHNIQUE: Contiguous axial images were obtained from the base of the skull through the vertex without intravenous contrast. RADIATION DOSE REDUCTION: This exam was performed according to the departmental dose-optimization program which includes automated exposure control, adjustment of  the mA and/or kV according to patient size and/or use of iterative reconstruction technique. COMPARISON:  02/13/2022 FINDINGS: Evaluation is somewhat limited by motion artifact. Brain: No evidence of acute infarction, hemorrhage, mass, mass effect, or midline shift. No hydrocephalus or extra-axial fluid collection. Extensive periventricular white matter changes, likely the sequela of severe chronic small vessel ischemic disease. Redemonstrated ex vacuo dilatation of the right-greater-than-left lateral ventricle. Cerebral volume loss is quite advanced for age. Vascular: No hyperdense vessel. Skull: Normal. Negative for fracture or focal lesion. Sinuses/Orbits: No acute finding. Other: The mastoid air cells are well aerated. IMPRESSION: Evaluation is somewhat limited by motion artifact. Within this limitation, no acute intracranial process. Electronically Signed   By: 06/12/2022 M.D.   On: 04/12/2022 14:26   DG Chest Port 1 View  Result Date: 04/12/2022 CLINICAL DATA:  Altered mental status EXAM: PORTABLE CHEST 1 VIEW COMPARISON:  Radiograph 02/13/2022 FINDINGS: The cardiomediastinal silhouette is within normal limits. There is no focal airspace disease. There is no pleural effusion. No pneumothorax. No acute osseous abnormality. Thoracic spondylosis. IMPRESSION: No evidence of acute cardiopulmonary disease. Electronically Signed   By: 06/12/2022 M.D.   On: 04/12/2022 12:09     Discharge Instructions: -Lactulose 20 g daily (goal of 2-3 bowel movements, can titrate up if needed or consider Rifaximin) -Metronidazole 500 mg twice daily for next 2 days (end date: 04/20/22)  Discharge Instructions     Call MD for:  difficulty breathing, headache or visual disturbances   Complete by: As directed    Call MD for:  extreme fatigue   Complete by: As directed    Call MD for:  hives   Complete by: As directed    Call MD for:  persistant dizziness or light-headedness   Complete by: As directed    Call MD  for:  persistant nausea and vomiting   Complete by: As directed    Call MD for:  severe uncontrolled pain   Complete by: As directed    Call MD for:  temperature >100.4   Complete by: As directed    Discharge diet:   Complete by: As directed    Patient was on dysphagia 1 diet which  consists of pureed food only.   Increase activity slowly   Complete by: As directed        Signed: Rana Snare, DO 04/18/2022, 3:50 PM   Pager: [phone]

## 2022-04-18 NOTE — TOC Progression Note (Signed)
Transition of Care [facility]) - Progression Note    Patient Details  Name: Jacob Bass MRN: [id number] Date of Birth: 1961/08/27  Transition of Care [facility]) CM/SW Contact  Lorri Frederick, LCSW Phone Number: 04/18/2022, 4:09 PM  Clinical Narrative:   CSW informed that pt clear for DC.  CSW messaged [facility], they can accept pt today.  Wandra Mannan, [facility] director will sign pt in, his email provided to [place].  APS worker Glory Rosebush informed of DC.        Barriers to Discharge: Barriers Resolved  Expected Discharge Plan and Services           Expected Discharge Date: 04/18/22                                     Social Determinants of Health (SDOH) Interventions    Readmission Risk Interventions     No data to display

## 2022-04-18 NOTE — TOC Transition Note (Signed)
Transition of Care [facility]) - CM/SW Discharge Note   Patient Details  Name: Jacob Bass MRN: [id number] Date of Birth: 06-22-1961  Transition of Care [facility]) CM/SW Contact:  Lorri Frederick, LCSW Phone Number: 04/18/2022, 4:07 PM   Clinical Narrative:   Pt discharging to [facility].  RN call report to [phone].    Final next level of care: Skilled Nursing Facility Barriers to Discharge: Barriers Resolved   Patient Goals and CMS Choice        Discharge Placement              Patient chooses bed at:  [facility]) Patient to be transferred to facility by: [facility] Name of family member notified: no contact avaible Patient and family notified of of transfer: 04/18/22  Discharge Plan and Services                                     Social Determinants of Health (SDOH) Interventions     Readmission Risk Interventions     No data to display

## 2022-04-18 NOTE — Evaluation (Signed)
Speech Language Pathology Evaluation Patient Details Name: Jacob Bass MRN: [id number] DOB: [birth year] Today's Date: 04/18/2022 Time: 2536-6440 SLP Time Calculation (min) (ACUTE ONLY): 16 min  Problem List:  Patient Active Problem List   Diagnosis Date Noted   Hypoglycemia    Encephalopathy, hepatic (HCC)    Goals of care, counseling/discussion    Failure to thrive in adult 04/12/2022   Hypokalemia    Generalized weakness    Hypomagnesemia    Chronic obstructive pulmonary disease (HCC)    Dysphagia    Fall    Gastric ulcer    Cerebral thrombosis with cerebral infarction 10/13/2018   Cerebral embolism with cerebral infarction 10/13/2018   Subarachnoid hemorrhage 10/13/2018   Intracerebral hemorrhage 10/13/2018   Heme positive stool    Cirrhosis of liver without ascites (HCC)    Chest pain 10/09/2018   Elevated troponin 10/09/2018   EKG abnormalities 10/09/2018   Homelessness 10/09/2018   Blood pressure elevated without history of HTN 10/09/2018   Anemia, macrocytic 10/09/2018   Thrombocytopenia (HCC) 10/09/2018   Past Medical History:  Past Medical History:  Diagnosis Date   CHF (congestive heart failure) (HCC)    Hypertension    Past Surgical History:  Past Surgical History:  Procedure Laterality Date   BIOPSY  10/19/2018   Procedure: BIOPSY;  Surgeon: Benancio Deeds, MD;  Location: [facility] ENDOSCOPY;  Service: Gastroenterology;;   ESOPHAGOGASTRODUODENOSCOPY (EGD) WITH PROPOFOL N/A 10/19/2018   Procedure: ESOPHAGOGASTRODUODENOSCOPY (EGD);  Surgeon: Benancio Deeds, MD;  Location: [facility] ENDOSCOPY;  Service: Gastroenterology;  Laterality: N/A;   HPI:  Pt is a 61 y.o. male who presents to the ED with decreased p.o. intake. Hx provided by Dr. Allena Katz from the facility. He reported that pt had not been eating well past few weeks and had a COPD exacerbation about 2 weeks ago. Dr. Allena Katz was concerned that he was more confused and less responsive, possibly from UTI vs  dehydration. He is also concerned because of pt's hx of subarachnoid hemorrhage. His baseline at the facility is he sits up but is nonverbal. He eats only when someone feeds him. Per RN note (8/10) "Attempted to give pt applesauce.  Pt swallows with no choking but you can hear a gulp as he is swallowing". CT head and CXR (8/10)- negative. MBS (10/27/18) noted sensed "aspiration... which appears to be related to increased volumes and rapidity of eating/swallowing as compared to the smaller volumes he consumed on MBS of 2/13". Dys 1, NTL recommended at that time. DO note (8/15) states "I suspect (pt) is clinically back to baseline after treatment for his hepatic encephalopathy however I cannot be sure of his baseline status (reported to have dementia and only oriented to self).  Will have a cognitive evaluation by SLP". PMH: dementia, cirrhosis and hepatitis C previous subarachnoid hemorrhage, previous alcohol abuse.   Assessment / Plan / Recommendation Clinical Impression  Pt lethargic upon SLP arrival, easily awakened with verbal greeting. When pt was being followed by SLP services for swallowing, pt reported to respond to questions about basic needs, smile/laugh, etc. This am, pt without spontaneous verbal expression despite cues/tasks to elicit. When asked basic biographical y/n questions, pt nodded/shook head in response with 30% accuracy given max verbal/visual cues. Following of commands was also poor, with pt requiring max verbal/tactile/visual and contextual cues to follow simple 1 step commands. More in depth cognitive testing (SLUMS) not attempted due to clinical presentation this date. Question impact of intermittent lethargy as well on performance. Suspect this  is a normal fluctuation in mentation for pt, given hx of dementia, and that he is likely at baseline. Will f/u briefly to complete further dx treatment given limited assessment this date. Above discussed with LPN and care team.    SLP  Assessment  SLP Recommendation/Assessment: Patient needs continued Speech Lanaguage Pathology Services SLP Visit Diagnosis: Cognitive communication deficit (R41.841)    Recommendations for follow up therapy are one component of a multi-disciplinary discharge planning process, led by the attending physician.  Recommendations may be updated based on patient status, additional functional criteria and insurance authorization.    Follow Up Recommendations  Skilled nursing-short term rehab (<3 hours/day)    Assistance Recommended at Discharge  Frequent or constant Supervision/Assistance  Functional Status Assessment Patient has had a recent decline in their functional status and demonstrates the ability to make significant improvements in function in a reasonable and predictable amount of time.  Frequency and Duration min 2x/week  2 weeks      SLP Evaluation Cognition  Overall Cognitive Status: History of cognitive impairments - at baseline Arousal/Alertness: Awake/alert Orientation Level: Disoriented X4 Attention: Sustained;Focused Focused Attention: Appears intact Sustained Attention: Impaired Sustained Attention Impairment: Verbal basic       Comprehension  Auditory Comprehension Overall Auditory Comprehension: Impaired Yes/No Questions: Impaired Basic Biographical Questions: 26-50% accurate Commands: Impaired One Step Basic Commands: 0-24% accurate Visual Recognition/Discrimination Discrimination: Not tested Reading Comprehension Reading Status: Not tested    Expression Expression Primary Mode of Expression: Nonverbal - gestures Verbal Expression Overall Verbal Expression: Impaired at baseline Written Expression Written Expression: Not tested   Oral / Motor  Oral Motor/Sensory Function Overall Oral Motor/Sensory Function: Mild impairment Facial Symmetry: Abnormal symmetry left (likely related to previous CVA) Motor Speech Overall Motor Speech:  (no verbal expression  observed this date)              Avie Echevaria, MA, CCC-SLP Acute Rehabilitation Services Office Number: [phone]  Paulette Blanch 04/18/2022, 10:35 AM

## 2022-06-30 ENCOUNTER — Emergency Department (HOSPITAL_COMMUNITY): Payer: Medicaid Other

## 2022-06-30 ENCOUNTER — Inpatient Hospital Stay (HOSPITAL_COMMUNITY)
Admission: EM | Admit: 2022-06-30 | Discharge: 2022-07-06 | DRG: 372 | Disposition: A | Discharge status: Skilled Nursing Facility | Payer: Medicaid Other | Source: Skilled Nursing Facility | Attending: Family Medicine | Admitting: Family Medicine

## 2022-06-30 ENCOUNTER — Other Ambulatory Visit: Payer: Self-pay

## 2022-06-30 DIAGNOSIS — E860 Dehydration: Secondary | ICD-10-CM | POA: Diagnosis present

## 2022-06-30 DIAGNOSIS — Z87440 Personal history of urinary (tract) infections: Secondary | ICD-10-CM

## 2022-06-30 DIAGNOSIS — Z20822 Contact with and (suspected) exposure to covid-19: Secondary | ICD-10-CM | POA: Diagnosis present

## 2022-06-30 DIAGNOSIS — I10 Essential (primary) hypertension: Secondary | ICD-10-CM | POA: Diagnosis present

## 2022-06-30 DIAGNOSIS — G9341 Metabolic encephalopathy: Secondary | ICD-10-CM | POA: Diagnosis present

## 2022-06-30 DIAGNOSIS — R1310 Dysphagia, unspecified: Secondary | ICD-10-CM | POA: Diagnosis present

## 2022-06-30 DIAGNOSIS — R600 Localized edema: Secondary | ICD-10-CM | POA: Diagnosis present

## 2022-06-30 DIAGNOSIS — R638 Other symptoms and signs concerning food and fluid intake: Secondary | ICD-10-CM

## 2022-06-30 DIAGNOSIS — Z880 Allergy status to penicillin: Secondary | ICD-10-CM

## 2022-06-30 DIAGNOSIS — K7460 Unspecified cirrhosis of liver: Secondary | ICD-10-CM | POA: Diagnosis present

## 2022-06-30 DIAGNOSIS — F0390 Unspecified dementia without behavioral disturbance: Secondary | ICD-10-CM | POA: Diagnosis present

## 2022-06-30 DIAGNOSIS — E870 Hyperosmolality and hypernatremia: Secondary | ICD-10-CM

## 2022-06-30 DIAGNOSIS — R509 Fever, unspecified: Secondary | ICD-10-CM

## 2022-06-30 DIAGNOSIS — G9340 Encephalopathy, unspecified: Principal | ICD-10-CM

## 2022-06-30 DIAGNOSIS — K652 Spontaneous bacterial peritonitis: Principal | ICD-10-CM | POA: Diagnosis present

## 2022-06-30 DIAGNOSIS — Z79899 Other long term (current) drug therapy: Secondary | ICD-10-CM

## 2022-06-30 DIAGNOSIS — R258 Other abnormal involuntary movements: Secondary | ICD-10-CM | POA: Diagnosis present

## 2022-06-30 DIAGNOSIS — Z538 Procedure and treatment not carried out for other reasons: Secondary | ICD-10-CM | POA: Diagnosis not present

## 2022-06-30 DIAGNOSIS — E861 Hypovolemia: Secondary | ICD-10-CM | POA: Diagnosis present

## 2022-06-30 DIAGNOSIS — N179 Acute kidney failure, unspecified: Secondary | ICD-10-CM | POA: Diagnosis not present

## 2022-06-30 DIAGNOSIS — Z8673 Personal history of transient ischemic attack (TIA), and cerebral infarction without residual deficits: Secondary | ICD-10-CM

## 2022-06-30 DIAGNOSIS — G9349 Other encephalopathy: Secondary | ICD-10-CM | POA: Diagnosis present

## 2022-06-30 DIAGNOSIS — J449 Chronic obstructive pulmonary disease, unspecified: Secondary | ICD-10-CM | POA: Diagnosis present

## 2022-06-30 DIAGNOSIS — R4182 Altered mental status, unspecified: Secondary | ICD-10-CM

## 2022-06-30 DIAGNOSIS — K7031 Alcoholic cirrhosis of liver with ascites: Secondary | ICD-10-CM | POA: Diagnosis present

## 2022-06-30 LAB — COMPREHENSIVE METABOLIC PANEL
ALT: 43 U/L (ref 0–44)
AST: 65 U/L — ABNORMAL HIGH (ref 15–41)
Albumin: 2 g/dL — ABNORMAL LOW (ref 3.5–5.0)
Alkaline Phosphatase: 85 U/L (ref 38–126)
Anion gap: 8 (ref 5–15)
BUN: 22 mg/dL (ref 8–23)
CO2: 21 mmol/L — ABNORMAL LOW (ref 22–32)
Calcium: 8.3 mg/dL — ABNORMAL LOW (ref 8.9–10.3)
Chloride: 118 mmol/L — ABNORMAL HIGH (ref 98–111)
Creatinine, Ser: 1.16 mg/dL (ref 0.61–1.24)
GFR, Estimated: >60 mL/min (ref >60)
Glucose, Bld: 87 mg/dL (ref 70–99)
Potassium: 4.1 mmol/L (ref 3.5–5.1)
Sodium: 147 mmol/L — ABNORMAL HIGH (ref 135–145)
Total Bilirubin: 1.4 mg/dL — ABNORMAL HIGH (ref 0.3–1.2)
Total Protein: 7.2 g/dL (ref 6.5–8.1)

## 2022-06-30 LAB — PROTIME-INR
INR: 1.4 — ABNORMAL HIGH (ref 0.8–1.2)
Prothrombin Time: 16.6 seconds — ABNORMAL HIGH (ref 11.4–15.2)

## 2022-06-30 LAB — CBC WITH DIFFERENTIAL/PLATELET
Abs Immature Granulocytes: 0.07 10*3/uL (ref 0.00–0.07)
Basophils Absolute: 0 10*3/uL (ref 0.0–0.1)
Basophils Relative: 0 %
Eosinophils Absolute: 0 10*3/uL (ref 0.0–0.5)
Eosinophils Relative: 0 %
HCT: 34.7 % — ABNORMAL LOW (ref 39.0–52.0)
Hemoglobin: 11.8 g/dL — ABNORMAL LOW (ref 13.0–17.0)
Immature Granulocytes: 1 %
Lymphocytes Relative: 25 %
Lymphs Abs: 3.2 10*3/uL (ref 0.7–4.0)
MCH: 34.1 pg — ABNORMAL HIGH (ref 26.0–34.0)
MCHC: 34 g/dL (ref 30.0–36.0)
MCV: 100.3 fL — ABNORMAL HIGH (ref 80.0–100.0)
Monocytes Absolute: 1.5 10*3/uL — ABNORMAL HIGH (ref 0.1–1.0)
Monocytes Relative: 12 %
Neutro Abs: 7.8 10*3/uL — ABNORMAL HIGH (ref 1.7–7.7)
Neutrophils Relative %: 62 %
Platelets: 101 10*3/uL — ABNORMAL LOW (ref 150–400)
RBC: 3.46 MIL/uL — ABNORMAL LOW (ref 4.22–5.81)
RDW: 15.5 % (ref 11.5–15.5)
WBC: 12.6 10*3/uL — ABNORMAL HIGH (ref 4.0–10.5)
nRBC: 0 % (ref 0.0–0.2)

## 2022-06-30 LAB — CBG MONITORING, ED: Glucose-Capillary: 84 mg/dL (ref 70–99)

## 2022-06-30 LAB — PHOSPHORUS: Phosphorus: 3.5 mg/dL (ref 2.5–4.6)

## 2022-06-30 LAB — APTT: aPTT: 33 seconds (ref 24–36)

## 2022-06-30 LAB — MAGNESIUM: Magnesium: 2 mg/dL (ref 1.7–2.4)

## 2022-06-30 LAB — LACTIC ACID, PLASMA: Lactic Acid, Venous: 1.8 mmol/L (ref 0.5–1.9)

## 2022-06-30 LAB — AMMONIA: Ammonia: 67 umol/L — ABNORMAL HIGH (ref 9–35)

## 2022-06-30 LAB — SARS CORONAVIRUS 2 BY RT PCR: SARS Coronavirus 2 by RT PCR: NEGATIVE

## 2022-06-30 MED ORDER — LACTATED RINGERS IV SOLN: INTRAVENOUS | Status: DC

## 2022-06-30 MED ORDER — SODIUM CHLORIDE 0.9 % IV BOLUS
1000.0000 mL | Freq: Once | INTRAVENOUS | Status: AC
  Administered 2022-06-30: 1000 mL via INTRAVENOUS

## 2022-06-30 MED ORDER — LACTATED RINGERS IV BOLUS
1000.0000 mL | Freq: Once | INTRAVENOUS | Status: AC
  Administered 2022-07-01: 1000 mL via INTRAVENOUS

## 2022-06-30 MED ORDER — FLUTICASONE FUROATE-VILANTEROL 200-25 MCG/ACT IN AEPB
1.0000 | INHALATION_SPRAY | Freq: Every day | RESPIRATORY_TRACT | Status: DC
  Administered 2022-07-03 – 2022-07-06 (×4): 1 via RESPIRATORY_TRACT
  Filled 2022-06-30: qty 28

## 2022-06-30 MED ORDER — UMECLIDINIUM BROMIDE 62.5 MCG/ACT IN AEPB
1.0000 | INHALATION_SPRAY | Freq: Every day | RESPIRATORY_TRACT | Status: DC
  Administered 2022-07-03 – 2022-07-06 (×4): 1 via RESPIRATORY_TRACT
  Filled 2022-06-30: qty 7

## 2022-06-30 MED ORDER — ENOXAPARIN SODIUM 40 MG/0.4ML IJ SOSY: 40.0000 mg | PREFILLED_SYRINGE | INTRAMUSCULAR | Status: DC

## 2022-06-30 NOTE — ED Notes (Signed)
In and out cath performed, 400 ml removed.

## 2022-06-30 NOTE — Assessment & Plan Note (Addendum)
Fever to 100.4 on admission. No identified source. Slight leukocytosis to 12.6, though this may just represent hemoconcentration in the setting of poor PO intake. COVID negative. No respiratory distress and negative CXR, do not suspect pulmonary source. Has a history of UTI during last admission with similar presentation.  - Urinalysis - Blood cultures obtained in ED, will follow

## 2022-06-30 NOTE — ED Notes (Signed)
Patient transported to CT 

## 2022-06-30 NOTE — Assessment & Plan Note (Addendum)
-   Continue home inhaler regimen (Spiriva, Advair, and prn albuterol)

## 2022-06-30 NOTE — Assessment & Plan Note (Signed)
In the setting of historical etOH abuse and Hep C in 2020. RUQ u/s with moderate ascites and nodular liver. Ammonia mildly elevated on admission, low suspicion for hepatic encephalopathy. Unable to tolerate paracentesis 10/30. Will hold off on repeat attempt given improvement in mental status and concurrent treatment for SBP - f/u HCV -Increase p.o. lactulose to BID given no BM yet

## 2022-06-30 NOTE — Assessment & Plan Note (Addendum)
Trace edema and stable. - Negative for DVT

## 2022-06-30 NOTE — ED Provider Notes (Signed)
[facility] EMERGENCY DEPARTMENT Provider Note   CSN: [id number] Arrival date & time: 06/30/22  1754     History {Add pertinent medical, surgical, social history, OB history to HPI:1} Chief Complaint  Patient presents with   Altered Mental Status    Rahul Boylen is a 61 y.o. male with a history of hepatic encephalopathy, UTIs, malnutrition, refeeding syndrome, denting from a nursing facility by EMS concern for behavioral change and confusion.  Staff had reported a decline in patient's mentation the past few days and difficulty with swallowing.  Patient not able to follow commands.  External records reviewed, hospital discharge summary from August of this year, 2 months ago, the patient was admitted to the hospital nonverbal with hyperglycemia, also with a noted history of combativeness and nursing facility, poor p.o. intake.  He was started on IV fluids and lactulose and after large bowel movement his mentation improved.  His encephalopathy was felt to be multifactorial at that time.  He had a left lower lobe opacity on chest x-ray, and received antibiotics for pneumonia as well as potential UTI.  He had a CT scan at that time showed remote infarct but no acute infarct.  He also had a UTI along with trichomonas and was treated with Rocephin as well as metronidazole.  HPI     Home Medications Prior to Admission medications   Medication Sig Start Date End Date Taking? Authorizing Provider  acetaminophen (TYLENOL) 500 MG tablet Take 1,000 mg by mouth every 8 (eight) hours as needed for moderate pain (severe pain).    [provider]  albuterol (VENTOLIN HFA) 108 (90 Base) MCG/ACT inhaler Inhale 2 puffs into the lungs every 4 (four) hours as needed for wheezing or shortness of breath.    [provider]  amLODipine (NORVASC) 2.5 MG tablet Take 1 tablet (2.5 mg total) by mouth daily. 10/20/18   Charlynne Cousins, MD  atorvastatin (LIPITOR) 40 MG tablet  Take 1 tablet (40 mg total) by mouth daily at 6 PM. 04/18/22   Angelique Blonder, DO  feeding supplement, ENSURE ENLIVE, (ENSURE ENLIVE) LIQD Take 237 mLs by mouth daily at 3 pm. Patient not taking: Reported on 04/17/2022 10/28/18   Florencia Reasons, MD  fluticasone-salmeterol (ADVAIR) 250-50 MCG/ACT AEPB Inhale 1 puff into the lungs in the morning and at bedtime.    [provider]  folic acid (FOLVITE) 1 MG tablet Take 1 tablet (1 mg total) by mouth daily. 10/28/18   Florencia Reasons, MD  ipratropium-albuterol (DUONEB) 0.5-2.5 (3) MG/3ML SOLN Take 3 mLs by nebulization every 4 (four) hours as needed. Patient taking differently: Take 3 mLs by nebulization every 8 (eight) hours as needed (wheezing, shortness of breath). 10/27/18   Florencia Reasons, MD  lactulose (CHRONULAC) 10 GM/15ML solution Take 30 mLs (20 g total) by mouth daily. 04/19/22   Angelique Blonder, DO  lisinopril (PRINIVIL,ZESTRIL) 5 MG tablet Take 1 tablet (5 mg total) by mouth daily. 10/27/18 06/09/22  Florencia Reasons, MD  magnesium oxide (MAG-OX) 400 (240 Mg) MG tablet Take 400 mg by mouth 3 (three) times daily.    [provider]  melatonin 5 MG TABS Take 5 mg by mouth at bedtime.    [provider]  metoprolol tartrate (LOPRESSOR) 25 MG tablet Take 1 tablet (25 mg total) by mouth 2 (two) times daily. 10/20/18   Charlynne Cousins, MD  naproxen sodium (ALEVE) 220 MG tablet Take 220 mg by mouth every 12 (twelve) hours as needed (mild  pain).    [provider]  pantoprazole (PROTONIX) 40 MG tablet Take 1 tablet (40 mg total) by mouth 2 (two) times daily. Patient taking differently: Take 40 mg by mouth daily. 10/20/18   Charlynne Cousins, MD  tamsulosin (FLOMAX) 0.4 MG CAPS capsule Take 1 capsule (0.4 mg total) by mouth daily after supper. Patient taking differently: Take 0.4 mg by mouth every evening. 10/27/18   Florencia Reasons, MD  tiotropium (SPIRIVA) 18 MCG inhalation capsule Place 18 mcg into inhaler and inhale daily.    [provider]  vitamin B-12 1000 MCG tablet Take 1 tablet (1,000 mcg total) by mouth daily. 10/28/18   Florencia Reasons, MD      Allergies    Penicillins    Review of Systems   Review of Systems  Physical Exam Updated Vital Signs BP (!) 161/72   Pulse 71   Temp (!) 100.4 F (38 C) (Oral)   Resp 20   SpO2 100%  Physical Exam Constitutional:      General: He is not in acute distress. HENT:     Head: Normocephalic and atraumatic.  Eyes:     Conjunctiva/sclera: Conjunctivae normal.     Pupils: Pupils are equal, round, and reactive to light.  Cardiovascular:     Rate and Rhythm: Normal rate and regular rhythm.  Pulmonary:     Effort: Pulmonary effort is normal. No respiratory distress.  Abdominal:     General: There is no distension.     Tenderness: There is no abdominal tenderness.  Skin:    General: Skin is warm and dry.  Neurological:     General: No focal deficit present.     Mental Status: He is alert. Mental status is at baseline.     Comments: Nonverbal, not following commands, moving all extremities     ED Results / Procedures / Treatments   Labs (all labs ordered are listed, but only abnormal results are displayed) Labs Reviewed  COMPREHENSIVE METABOLIC PANEL - Abnormal; Notable for the following components:      Result Value   Sodium 147 (*)    Chloride 118 (*)    CO2 21 (*)    Calcium 8.3 (*)    Albumin 2.0 (*)    AST 65 (*)    Total Bilirubin 1.4 (*)    All other components within normal limits  CBC WITH DIFFERENTIAL/PLATELET - Abnormal; Notable for the following components:   WBC 12.6 (*)    RBC 3.46 (*)    Hemoglobin 11.8 (*)    HCT 34.7 (*)    MCV 100.3 (*)    MCH 34.1 (*)    Platelets 101 (*)    Neutro Abs 7.8 (*)    Monocytes Absolute 1.5 (*)    All other components within normal limits  PROTIME-INR - Abnormal; Notable for the following components:   Prothrombin Time 16.6 (*)    INR 1.4 (*)    All other components within normal limits  AMMONIA -  Abnormal; Notable for the following components:   Ammonia 67 (*)    All other components within normal limits  CULTURE, BLOOD (SINGLE)  SARS CORONAVIRUS 2 BY RT PCR  LACTIC ACID, PLASMA  APTT  MAGNESIUM  PHOSPHORUS  URINALYSIS, ROUTINE W REFLEX MICROSCOPIC  CBG MONITORING, ED    EKG None  Radiology CT Head Wo Contrast  Result Date: 06/30/2022 CLINICAL DATA:  Mental status change, unknown cause EXAM: CT HEAD WITHOUT CONTRAST TECHNIQUE: Contiguous axial images  were obtained from the base of the skull through the vertex without intravenous contrast. RADIATION DOSE REDUCTION: This exam was performed according to the departmental dose-optimization program which includes automated exposure control, adjustment of the mA and/or kV according to patient size and/or use of iterative reconstruction technique. COMPARISON:  04/12/2022 FINDINGS: Brain: Parenchymal volume loss is advanced given the patient's age but appears stable since prior examination. Extensive subcortical and periventricular white matter changes are present, possibly reflecting sequela of small vessel ischemia. Remote infarcts within the right basal ganglia and right corona radiata are noted with resultant mild ex vacuo dilation of the right lateral ventricle. Remote lacunar infarct within the right pons. No acute intracranial hemorrhage or infarct. No abnormal mass effect or midline shift. No abnormal intra or extra-axial mass lesion. Borderline ventriculomegaly appears stable. Cerebellum is unremarkable. Vascular: No hyperdense vessel or unexpected calcification. Skull: Normal. Negative for fracture or focal lesion. Sinuses/Orbits: No acute finding. Other: Mastoid air cells and middle ear cavities are clear. IMPRESSION: 1. No acute intracranial hemorrhage or infarct. 2. Advanced senescent change, stable since prior examination. 3. Remote infarcts within the right basal ganglia, right corona radiata and right pons. 4. Stable borderline  ventriculomegaly. Electronically Signed   By: Fidela Salisbury M.D.   On: 06/30/2022 20:47   DG Chest Port 1 View  Result Date: 06/30/2022 CLINICAL DATA:  Sepsis EXAM: PORTABLE CHEST 1 VIEW COMPARISON:  04/14/2022 FINDINGS: The heart size and mediastinal contours are within normal limits. Both lungs are clear. The visualized skeletal structures are unremarkable. IMPRESSION: No active disease. Electronically Signed   By: Fidela Salisbury M.D.   On: 06/30/2022 19:38    Procedures Procedures  {Document cardiac monitor, telemetry assessment procedure when appropriate:1}  Medications Ordered in ED Medications  sodium chloride 0.9 % bolus 1,000 mL (has no administration in time range)  sodium chloride 0.9 % bolus 1,000 mL (0 mLs Intravenous Stopped 06/30/22 2057)    ED Course/ Medical Decision Making/ A&P Clinical Course as of 06/30/22 2137  Sat Jun 30, 2022  2135 No response from [facility] rehab on second attempt to call.  No family contacts listed.   [MT]    Clinical Course User Index [MT] Lawrencia Mauney, Carola Rhine, MD                           Medical Decision Making Amount and/or Complexity of Data Reviewed Labs: ordered. Radiology: ordered. ECG/medicine tests: ordered.   This patient presents to the Emergency Department with complaint of altered mental status.  This involves an extensive number of treatment options, and is a complaint that carries with it a high risk of complications and morbidity.  The differential diagnosis includes hypoglycemia vs metabolic encephalopathy vs infection (including cystitis) vs ICH vs stroke vs polypharmacy vs other  I ordered, reviewed, and interpreted labs, including leukocytosis.  Ammonia elevated at 67.  CMP showing chronic hypoalbuminemia, some hyponatremia.  Lactate within normal limits.  Phosphate and magnesium within normal limits I ordered medication IV fluid bolus for suspected dehydration, mild I ordered imaging studies which included CT scan of  the head, x-ray of the chest I independently visualized and interpreted imaging which showed chronic strokes in the brain, no acute intracranial finding, no focal infiltrate or pneumonia and the monitor tracing which showed sinus rhythm Additional history was obtained from EMS. Several attempts were made to contact the patient's nursing facility for additional history, to verify his baseline mental status, but  was unable to reach anyone on staff.  He has no family emergency contact listed, only a friend.  However, per review of external records and his most recent hospital discharge summary, it appears the patient was verbal at that time, and he is not able to talk to me at this time, does appear quite confused.  Therefore I think is reasonable to assume this is an acute mental status change.  I personally reviewed the patients ECG which showed sinus rhythm with no acute ischemic findings  After the interventions stated above, I reevaluated the patient and found ***    {Document critical care time when appropriate:1} {Document review of labs and clinical decision tools ie heart score, Chads2Vasc2 etc:1}  {Document your independent review of radiology images, and any outside records:1} {Document your discussion with family members, caretakers, and with consultants:1} {Document social determinants of health affecting pt's care:1} {Document your decision making why or why not admission, treatments were needed:1} Final Clinical Impression(s) / ED Diagnoses Final diagnoses:  None    Rx / DC Orders ED Discharge Orders     None

## 2022-06-30 NOTE — ED Triage Notes (Signed)
Pt BIB [facility] from [facility] for AMS. Facility staff noted pt to have decline in mentation over the last few days; pt unable to swallow, L facial droop and neglect. Hx of previous CVA. Pt unable to follow commands at this time.

## 2022-06-30 NOTE — Assessment & Plan Note (Addendum)
Unclear cause at this time. CT head with remote infarcts and advanced senescent change but no acute intracranial hemorrhage or infarct. New neuro changes of left sided arm contracture and left ankle clonus are concerning for stroke. Known history of dementia and recent admission for similar presentation thought to be secondary to hepatic encephalopathy and a UTI.  - Admit to med-tele, Dr. Thompson Grayer attending - MRI Brain, if positive, will consult neurology and pursue full stroke workup - Delirium and fall precautions - Pursuing infectious workup as discussed below - NPO, stroke swallow screen - PT/OT eval and treat - Restart home statin once passes swallow screen

## 2022-06-30 NOTE — Assessment & Plan Note (Addendum)
At baseline will take food/fluids when offered.  - Assist with meals once able to take PO  - Please offer fluids q4h as tolerated

## 2022-06-30 NOTE — Assessment & Plan Note (Addendum)
On lisinopril, metoprolol, and amlodipine at home. BP moderately elevated. -Continue home meds

## 2022-06-30 NOTE — H&P (Cosign Needed)
Hospital Admission History and Physical Service Pager: [phone]  Patient name: Jacob Bass Medical record number: [id number] Date of Birth: Oct 31, 1960 Age: 61 y.o. Gender: male  Primary Care Provider: Patient, No Pcp Per Consultants: None Code Status: Full Code, confirmed with paperwork brought in from SNF  Preferred Emergency Contact: None listed in chart, per SNF staff, there is no family involvement in his care.   Chief Complaint: Altered mental status  Assessment and Plan: Jacob Bass is a 61 y.o. male presenting with Altered mental status . Differential for this patient's presentation of this includes acute CVA, hepatic encephalopathy, infection, versus progression of his baseline dementia. Despite negative CT head in the ED, I am highly suspicious for acute stroke given acute onset of symptoms, new LUE contracture and L ankle clonus. Hepatic encephalopathy remains a possibility, though review of SNF records reveals adherence to his daily lactulose. Infection is another possibility given history of UTI and fever to 100.4 on admission.   * Altered mental status Unclear cause at this time. CT head with remote infarcts and advanced senescent change but no acute intracranial hemorrhage or infarct. New neuro changes of left sided arm contracture and left ankle clonus are concerning for stroke. Known history of dementia and recent admission for similar presentation thought to be secondary to hepatic encephalopathy and a UTI.  - Admit to med-tele, Dr. Miquel Dunn attending - MRI Brain, if positive, will consult neurology and pursue full stroke workup - Delirium and fall precautions - Pursuing infectious workup as discussed below - NPO, stroke swallow screen - PT/OT eval and treat - Restart home statin once passes swallow screen  Cirrhosis of liver without ascites (HCC) In the setting of historical etOH abuse and Hep C in 2020. Per chart review was referred to ID for hep C treatment but  unclear if he ever received treatment. Possible that hepatic encephalopathy is contributing to AMS given elevated ammonia at 67 but per records from SNF, patient has been adherent to daily lactulose.  - HCV RNA Quant - Restart lactulose if passes swallow screen - If stroke workup is negative and fails swallow screen, would recommend lactulose enema to treat for hepatic encephalopathy  Fever Fever to 100.4 on admission. No identified source. Slight leukocytosis to 12.6, though this may just represent hemoconcentration in the setting of poor PO intake. COVID negative. No respiratory distress and negative CXR, do not suspect pulmonary source. Has a history of UTI during last admission with similar presentation.  - Urinalysis - Blood cultures obtained in ED, will follow  Poor fluid intake Per SNF staff has not been taking PO today. At baseline will take food/fluids when offered. Mucous membranes dry and ED labwork suspicious for hemoconcentration. - LR 1L bolus - Maintenance fluids while NPO - Assist with meals once able to take PO  - Once passes swallow screen, will have RN offer water q4h while awake - SLP eval  Bilateral lower extremity edema Per SNF staff this is a new finding. 2+ pitting edema to mid-shin bilaterally. Likely 2/2 hepatic dysfunction, albumin 2.0, but warrants further investigation for other causes. Last echo in 2020 with hyperdynamic systolic function with EF >65% and increased LV wall thickness. Mildly dilated LA and mild pulmonary htn.  - Echocardiogram (will order with bubble study given concomitant stroke-like symptoms)  COPD (chronic obstructive pulmonary disease) (HCC) Home inhalers include Spiriva, Advair, and prn albuterol. No evidence of exacerbation on exam. - Continue home inhaler regimen  Hypertension On lisinopril,  metoprolol, and amlodipine at home. BP moderately elevated in ED. - Will hold off on restarting antihypertensives until acute stroke is  ruled out to allow for permissive htn    FEN/GI: LR mIVF while NPO, if passes swallow screen will need assistance with meals VTE Prophylaxis: Lovenox  Disposition: Med-Tele, return to SNF upon discharge  History of Present Illness:  Jacob Bass is a 61 y.o. male presenting with altered mental status.   Patient comes in from the SNF where he is a long-term care patient. He has advanced dementia at baseline and is unable to provide any history. History obtained from EMS/ED Provider and Karolee Stamps, supervisor at his SNF who is aware of his care. She reports that he "doesn't  talk a lot" at baseline, but will often answer questions in 1-2 word phrases and will eat/drink when food or fluids are offered to him. Today he would not answer any questions and wouldn't chew or swallow foods that were offered to him and she also noticed that he wasn't using his left arm (though he does not make many purposeful movements at baseline). She describes this as an acute change starting last night/this morning.   He had an admission two months ago with similar presentation. At that time he was presumed to have hepatic encephalopathy and was started on daily lactulose which he has continued since discharge. He was also found to have a UTI at that time.   In the ED, CT head was negative for acute stroke but did show senescent changes and remote infarcts.   Review Of Systems: Per HPI with the following additions: Patient unable to participate in interview  Pertinent Past Medical History: Subarachnoid hemorrhage CVA Cirrhosis Hep C Dementia Remainder reviewed in history tab.   Pertinent Past Surgical History: Remainder reviewed in history tab.   Pertinent Social History: Unable to participate in history taking, but he lives at [facility] and per chart review has a history of etOH abuse  Pertinent Family History: Unable to participate   Important Outpatient Medications: Lactulose Ensure feeding  supplement Amlodipine, Metoprolol tartrate, lisinopril Remainder reviewed in medication history.   Objective: BP (!) 161/72   Pulse 67   Temp (!) 100.4 F (38 C) (Oral)   Resp 17   SpO2 97%  Exam: General: Frail appearing, intermittently responsive, opens eyes and answers some questions with one word answers, denies pain Eyes: Pupils reactive, pterygium over L iris ENTM: Mucous membranes dry Neck: Without cervical lymphadenopathy  Cardiovascular: Regular rate and rhythm Respiratory: Normal WOB on RA, lungs clear anteriorly Gastrointestinal: Belly mildly distended, without fluid wave, no mass or organomegaly MSK: 2+ pitting edema to mid-shins bilaterally Neuro: L arm held in flexion against chest, no purposeful movement noted, LLE with ankle clonus--none observed on Right  Labs:  CBC BMET  Recent Labs  Lab 06/30/22 1935  WBC 12.6*  HGB 11.8*  HCT 34.7*  PLT 101*   Recent Labs  Lab 06/30/22 1935  NA 147*  K 4.1  CL 118*  CO2 21*  BUN 22  CREATININE 1.16  GLUCOSE 87  CALCIUM 8.3*    Pertinent additional labs: Ammonia 67 (was 79 last admission) INR 1.4 Albumin 2.0  COVID negative  EKG: Normal sinus rhythm, QTc 468   Imaging Studies Performed: CXR with no active disease  CT head with no acute hemorrhage or infarct but with advanced senescent change and remote infarcts, stable borderline ventriculomegaly     Alicia Amel, MD 06/30/2022, 11:39 PM  PGY-2, [place] Intern pager: [phone], text pages welcome Secure chat group [place]

## 2022-07-01 ENCOUNTER — Inpatient Hospital Stay (HOSPITAL_COMMUNITY): Payer: Medicaid Other

## 2022-07-01 ENCOUNTER — Observation Stay (HOSPITAL_COMMUNITY): Payer: Medicaid Other

## 2022-07-01 DIAGNOSIS — R1310 Dysphagia, unspecified: Secondary | ICD-10-CM | POA: Diagnosis present

## 2022-07-01 DIAGNOSIS — Z79899 Other long term (current) drug therapy: Secondary | ICD-10-CM | POA: Diagnosis not present

## 2022-07-01 DIAGNOSIS — Z87440 Personal history of urinary (tract) infections: Secondary | ICD-10-CM | POA: Diagnosis not present

## 2022-07-01 DIAGNOSIS — N179 Acute kidney failure, unspecified: Secondary | ICD-10-CM | POA: Diagnosis not present

## 2022-07-01 DIAGNOSIS — R4182 Altered mental status, unspecified: Secondary | ICD-10-CM | POA: Diagnosis present

## 2022-07-01 DIAGNOSIS — J449 Chronic obstructive pulmonary disease, unspecified: Secondary | ICD-10-CM | POA: Diagnosis present

## 2022-07-01 DIAGNOSIS — M7989 Other specified soft tissue disorders: Secondary | ICD-10-CM

## 2022-07-01 DIAGNOSIS — G9341 Metabolic encephalopathy: Secondary | ICD-10-CM | POA: Diagnosis not present

## 2022-07-01 DIAGNOSIS — E861 Hypovolemia: Secondary | ICD-10-CM | POA: Diagnosis present

## 2022-07-01 DIAGNOSIS — Z20822 Contact with and (suspected) exposure to covid-19: Secondary | ICD-10-CM | POA: Diagnosis present

## 2022-07-01 DIAGNOSIS — K652 Spontaneous bacterial peritonitis: Secondary | ICD-10-CM | POA: Diagnosis not present

## 2022-07-01 DIAGNOSIS — F0390 Unspecified dementia without behavioral disturbance: Secondary | ICD-10-CM | POA: Diagnosis present

## 2022-07-01 DIAGNOSIS — Z880 Allergy status to penicillin: Secondary | ICD-10-CM | POA: Diagnosis not present

## 2022-07-01 DIAGNOSIS — R258 Other abnormal involuntary movements: Secondary | ICD-10-CM | POA: Diagnosis present

## 2022-07-01 DIAGNOSIS — R600 Localized edema: Secondary | ICD-10-CM | POA: Diagnosis present

## 2022-07-01 DIAGNOSIS — G9340 Encephalopathy, unspecified: Secondary | ICD-10-CM | POA: Diagnosis present

## 2022-07-01 DIAGNOSIS — K7460 Unspecified cirrhosis of liver: Secondary | ICD-10-CM | POA: Diagnosis not present

## 2022-07-01 DIAGNOSIS — Z8673 Personal history of transient ischemic attack (TIA), and cerebral infarction without residual deficits: Secondary | ICD-10-CM | POA: Diagnosis not present

## 2022-07-01 DIAGNOSIS — I10 Essential (primary) hypertension: Secondary | ICD-10-CM | POA: Diagnosis present

## 2022-07-01 DIAGNOSIS — G9349 Other encephalopathy: Secondary | ICD-10-CM | POA: Diagnosis present

## 2022-07-01 DIAGNOSIS — E860 Dehydration: Secondary | ICD-10-CM | POA: Diagnosis present

## 2022-07-01 DIAGNOSIS — Z538 Procedure and treatment not carried out for other reasons: Secondary | ICD-10-CM | POA: Diagnosis not present

## 2022-07-01 DIAGNOSIS — K7031 Alcoholic cirrhosis of liver with ascites: Secondary | ICD-10-CM | POA: Diagnosis present

## 2022-07-01 DIAGNOSIS — E870 Hyperosmolality and hypernatremia: Secondary | ICD-10-CM | POA: Diagnosis present

## 2022-07-01 LAB — URINALYSIS, ROUTINE W REFLEX MICROSCOPIC
Bilirubin Urine: NEGATIVE
Glucose, UA: NEGATIVE mg/dL
Ketones, ur: NEGATIVE mg/dL
Leukocytes,Ua: NEGATIVE
Nitrite: NEGATIVE
Protein, ur: >300 mg/dL — AB
Specific Gravity, Urine: 1.025 (ref 1.005–1.030)
pH: 6.5 (ref 5.0–8.0)

## 2022-07-01 LAB — LIPID PANEL
Cholesterol: 169 mg/dL (ref 0–200)
HDL: 36 mg/dL — ABNORMAL LOW (ref >40)
LDL Cholesterol: 115 mg/dL — ABNORMAL HIGH (ref 0–99)
Total CHOL/HDL Ratio: 4.7 RATIO
Triglycerides: 91 mg/dL (ref <150)
VLDL: 18 mg/dL (ref 0–40)

## 2022-07-01 LAB — COMPREHENSIVE METABOLIC PANEL
ALT: 40 U/L (ref 0–44)
AST: 60 U/L — ABNORMAL HIGH (ref 15–41)
Albumin: 1.8 g/dL — ABNORMAL LOW (ref 3.5–5.0)
Alkaline Phosphatase: 68 U/L (ref 38–126)
Anion gap: 8 (ref 5–15)
BUN: 19 mg/dL (ref 8–23)
CO2: 20 mmol/L — ABNORMAL LOW (ref 22–32)
Calcium: 8.1 mg/dL — ABNORMAL LOW (ref 8.9–10.3)
Chloride: 119 mmol/L — ABNORMAL HIGH (ref 98–111)
Creatinine, Ser: 0.91 mg/dL (ref 0.61–1.24)
GFR, Estimated: >60 mL/min (ref >60)
Glucose, Bld: 70 mg/dL (ref 70–99)
Potassium: 4.6 mmol/L (ref 3.5–5.1)
Sodium: 147 mmol/L — ABNORMAL HIGH (ref 135–145)
Total Bilirubin: 1.8 mg/dL — ABNORMAL HIGH (ref 0.3–1.2)
Total Protein: 6.6 g/dL (ref 6.5–8.1)

## 2022-07-01 LAB — CBC
HCT: 36.7 % — ABNORMAL LOW (ref 39.0–52.0)
Hemoglobin: 12.4 g/dL — ABNORMAL LOW (ref 13.0–17.0)
MCH: 34.3 pg — ABNORMAL HIGH (ref 26.0–34.0)
MCHC: 33.8 g/dL (ref 30.0–36.0)
MCV: 101.4 fL — ABNORMAL HIGH (ref 80.0–100.0)
Platelets: 87 10*3/uL — ABNORMAL LOW (ref 150–400)
RBC: 3.62 MIL/uL — ABNORMAL LOW (ref 4.22–5.81)
RDW: 15.1 % (ref 11.5–15.5)
WBC: 7.8 10*3/uL (ref 4.0–10.5)
nRBC: 0 % (ref 0.0–0.2)

## 2022-07-01 LAB — URINALYSIS, MICROSCOPIC (REFLEX)

## 2022-07-01 LAB — RAPID URINE DRUG SCREEN, HOSP PERFORMED
Amphetamines: NOT DETECTED
Barbiturates: NOT DETECTED
Benzodiazepines: NOT DETECTED
Cocaine: NOT DETECTED
Opiates: NOT DETECTED
Tetrahydrocannabinol: NOT DETECTED

## 2022-07-01 MED ORDER — ACETAMINOPHEN 650 MG RE SUPP: 650.0000 mg | RECTAL | Status: DC | PRN

## 2022-07-01 MED ORDER — DEXTROSE IN LACTATED RINGERS 5 % IV SOLN
INTRAVENOUS | Status: DC
  Filled 2022-07-01: qty 1000

## 2022-07-01 MED ORDER — LORAZEPAM 2 MG/ML IJ SOLN
0.5000 mg | Freq: Once | INTRAMUSCULAR | Status: AC | PRN
  Administered 2022-07-05: 0.5 mg via INTRAVENOUS
  Filled 2022-07-01: qty 1

## 2022-07-01 MED ORDER — LORAZEPAM 2 MG/ML IJ SOLN
0.5000 mg | Freq: Once | INTRAMUSCULAR | Status: DC | PRN
  Filled 2022-07-01: qty 1

## 2022-07-01 MED ORDER — SODIUM CHLORIDE 0.9 % IV SOLN
2.0000 g | INTRAVENOUS | Status: DC
  Administered 2022-07-01 – 2022-07-04 (×4): 2 g via INTRAVENOUS
  Filled 2022-07-01 (×4): qty 20

## 2022-07-01 MED ORDER — ACETAMINOPHEN 325 MG PO TABS: 650.0000 mg | ORAL_TABLET | Freq: Four times a day (QID) | ORAL | Status: DC | PRN

## 2022-07-01 MED ORDER — LACTULOSE ENEMA
300.0000 mL | Freq: Once | ORAL | Status: AC
  Administered 2022-07-01: 300 mL via RECTAL
  Filled 2022-07-01: qty 300

## 2022-07-01 MED ORDER — GADOBUTROL 1 MMOL/ML IV SOLN
7.5000 mL | Freq: Once | INTRAVENOUS | Status: AC | PRN
  Administered 2022-07-01: 7.5 mL via INTRAVENOUS

## 2022-07-01 MED ORDER — ENOXAPARIN SODIUM 40 MG/0.4ML IJ SOSY
40.0000 mg | PREFILLED_SYRINGE | INTRAMUSCULAR | Status: DC
  Administered 2022-07-02 – 2022-07-06 (×5): 40 mg via SUBCUTANEOUS
  Filled 2022-07-01 (×5): qty 0.4

## 2022-07-01 NOTE — ED Notes (Signed)
Pt had 2 large bowel movements during enema administration.

## 2022-07-01 NOTE — Progress Notes (Signed)
   06/30/22 2153  Clinical Encounter Type  Visited With Patient not available  Visit Type ED;Trauma;Initial  Referral From Nurse (Martin[place] A. [place], RN)  Consult/Referral To Chaplain Melvenia Beam)  Recommendations Patient sleeping   Responded to Emergency Department; Room 21 for Level 2 Trauma. Patient sleeping and therefore patient not seen by Chaplain at this time. No family present at this time.  Chaplain will be available for consultation upon request of patient, family, or staff.  Chaplain Jamire Shabazz, M.Min., [phone].

## 2022-07-01 NOTE — Progress Notes (Signed)
VASCULAR LAB    Left lower extremity venous duplex has been performed.  See CV proc for preliminary results.   Jannat Rosemeyer, RVT 07/01/2022, 2:32 PM

## 2022-07-01 NOTE — Progress Notes (Addendum)
Daily Progress Note Intern Pager: [phone]  Patient name: Jacob Bass Medical record number: [id number] Date of birth: 1961/02/02 Age: 61 y.o. Gender: male  Primary Care Provider: Patient, No Pcp Per Consultants: None Code Status: Full  Pt Overview and Major Events to Date:  10/29 admitted. CT head/MRI negative for acute process  Assessment and Plan:  Lionell Asare is a 61 y.o. male presenting with Altered mental status likely secondary to progressive of chronic vascular disease. Pertinent PMH/PSH includes prior CVA, subarachnoid hemorrhage, cirrhosis, hep C, hx of etOH abuse, dementia, COPD .   * Altered mental status Most likely secondary to worsening of chronic small vessel disease progressing into right deep gray nuclei and brainstem seen on MRI. Dehydration could be contributing as well. No further fevers from admission. Does appear adherent to lactulose at SNF.  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  - HCV RNA Quant - RUQ u/s - Consider paracentesis - Restart lactulose if passes swallow screen - Consider lactulose enema if still NPO  Fever Fever to 100.4 on admission. No further fevers or sources. Covid negative. Blood and urine cxs pending.  - F/u cxs - Monitor fever curve - Consider SBP prophylaxis  Poor fluid intake At baseline will take food/fluids when offered. S/p 2 NS bolus and 1 LR bolus.. - [facility] swallow screen - LR 1L bolus - Maintenance fluids while NPO - Assist with meals once able to take PO  - Once passes swallow screen, will have RN offer water q4h while awake - SLP eval if fails [facility]  Bilateral lower extremity edema Per SNF staff this is a new finding. 2+ pitting edema to mid-shin bilaterally. Likely 2/2 hepatic  dysfunction. Last echo in 2020 with hyperdynamic systolic function with EF >65% and increased LV wall thickness. Mildly dilated LA and mild pulmonary htn.  - F/u Echocardiogram (will order with bubble study given concomitant stroke-like symptoms) - Monitor while on fluid resuscitation  COPD (chronic obstructive pulmonary disease) (HCC) Home inhalers include Spiriva, Advair, and prn albuterol. No evidence of exacerbation on exam. - Continue home inhaler regimen  Hypertension On lisinopril, metoprolol, and amlodipine at home. BP moderately elevated in ED. - restart home meds if passing swallow study   FEN/GI: NPO pending SLP PPx: lovenox Dispo:pending clinical improvement  Subjective:  Messaged by EMT that patient had right gaze preference. Went to room to assess. Patient slow with commands but was able to tell me "nuh uh" when asking if he had any pain and was able to turn his head in both direction and have his pupils cross midline to look at me.   Objective: Temp:  [98.9 F (37.2 C)-100.4 F (38 C)] 99 F (37.2 C) (10/29 0700) Pulse Rate:  [58-73] 60 (10/29 1130) Resp:  [12-21] 17 (10/29 1130) BP: (137-186)/(62-97) 186/80 (10/29 1130) SpO2:  [97 %-100 %] 100 % (10/29 1130) Physical Exam: General: NAD, mouth agape, unable to answer orientation questions. Cardiovascular: RRR, no murmurs rubs or gallops Respiratory: Clear to auscultation bilaterally, no wheezes rales or crackles, no retractions on room air Abdomen: Distended, fluid wave present, nontender when palpating Extremities: Left lower extremity with worsening swelling and right lower extremity Neuro: CN II: PERRL, Left eye pterygium  CN III, IV,VI: able to  look to left and right on command, does nto follow my command to look up and down. CV V: Unable to assess CVII: No significant facial droop present CN VIII: Does respond to some noise CN IX,X: Unable to assess CN XI: Unable to assess CN XII: Unable to assess UE  and LE moving right side well, left side keeping hand close to self and not moving left leg  Laboratory: Most recent CBC Lab Results  Component Value Date   WBC 12.6 (H) 06/30/2022   HGB 11.8 (L) 06/30/2022   HCT 34.7 (L) 06/30/2022   MCV 100.3 (H) 06/30/2022   PLT 101 (L) 06/30/2022   Most recent BMP    Latest Ref Rng & Units 06/30/2022    7:35 PM  BMP  Glucose 70 - 99 mg/dL 87   BUN 8 - 23 mg/dL 22   Creatinine 1.61 - 1.24 mg/dL 0.96   Sodium 045 - 409 mmol/L 147   Potassium 3.5 - 5.1 mmol/L 4.1   Chloride 98 - 111 mmol/L 118   CO2 22 - 32 mmol/L 21   Calcium 8.9 - 10.3 mg/dL 8.3     Imaging/Diagnostic Tests: MR BRAIN W WO CONTRAST  Result Date: 07/01/2022 CLINICAL DATA:  61 year old male with mental status change, neurologic deficits. EXAM: MRI HEAD WITHOUT AND WITH CONTRAST TECHNIQUE: Multiplanar, multiecho pulse sequences of the brain and surrounding structures were obtained without and with intravenous contrast. CONTRAST:  7.62mL GADAVIST GADOBUTROL 1 MMOL/ML IV SOLN COMPARISON:  Brain MRI 10/13/2018.  Head CT yesterday and earlier. FINDINGS: Brain: Study is intermittently degraded by motion artifact despite repeated imaging attempts. No restricted diffusion or evidence of acute infarction. Increasing ex vacuo enlargement of the lateral ventricle since 2020 appears related to progressive white matter and right side deep gray matter nuclei ischemia since that time. Progressed Wallerian degeneration in the right midbrain. Chronic right pontine lacunar infarct. Background confluent and widespread bilateral cerebral white matter T2 and FLAIR hyperintensity. No definite cerebral cortical encephalomalacia. Motion degraded SWI with some chronic microhemorrhages in the right deep gray nuclei and midbrain. No abnormal enhancement identified. No dural thickening. No midline shift, mass effect, evidence of mass lesion, ventriculomegaly, extra-axial collection or acute intracranial  hemorrhage. Cervicomedullary junction and pituitary are within normal limits. Vascular: Major intracranial vascular flow voids are preserved with some generalized intracranial artery tortuosity. Major dural venous sinuses are enhancing and appear to be patent. Skull and upper cervical spine: Negative. Visualized bone marrow signal is within normal limits. Sinuses/Orbits: Negative. Other: Mastoids are clear.  Grossly negative visible scalp and face. IMPRESSION: 1. Motion degraded exam despite repeated imaging attempts. No acute intracranial abnormality identified. 2. Advanced chronic small vessel disease, progressed since 2020 and severe in the right deep gray nuclei and brainstem. Electronically Signed   By: Odessa Fleming M.D.   On: 07/01/2022 06:15   DG Abd 1 View  Result Date: 07/01/2022 CLINICAL DATA:  Stroke-like symptoms, clearance prior to MRI brain. EXAM: ABDOMEN - 1 VIEW COMPARISON:  10/14/2018. FINDINGS: The bowel gas pattern is normal. No radiopaque foreign body is identified. There is atherosclerotic calcification of the aorta and vascular calcifications are present in the pelvis. Severe degenerative changes are noted at the right hip. No radio-opaque calculi or acute significant radiographic abnormality are seen. IMPRESSION: No radiopaque foreign body. Electronically Signed   By: Thornell Sartorius M.D.   On: 07/01/2022 04:44   CT Head Wo Contrast  Result Date: 06/30/2022 CLINICAL DATA:  Mental status change, unknown  cause EXAM: CT HEAD WITHOUT CONTRAST TECHNIQUE: Contiguous axial images were obtained from the base of the skull through the vertex without intravenous contrast. RADIATION DOSE REDUCTION: This exam was performed according to the departmental dose-optimization program which includes automated exposure control, adjustment of the mA and/or kV according to patient size and/or use of iterative reconstruction technique. COMPARISON:  04/12/2022 FINDINGS: Brain: Parenchymal volume loss is advanced  given the patient's age but appears stable since prior examination. Extensive subcortical and periventricular white matter changes are present, possibly reflecting sequela of small vessel ischemia. Remote infarcts within the right basal ganglia and right corona radiata are noted with resultant mild ex vacuo dilation of the right lateral ventricle. Remote lacunar infarct within the right pons. No acute intracranial hemorrhage or infarct. No abnormal mass effect or midline shift. No abnormal intra or extra-axial mass lesion. Borderline ventriculomegaly appears stable. Cerebellum is unremarkable. Vascular: No hyperdense vessel or unexpected calcification. Skull: Normal. Negative for fracture or focal lesion. Sinuses/Orbits: No acute finding. Other: Mastoid air cells and middle ear cavities are clear. IMPRESSION: 1. No acute intracranial hemorrhage or infarct. 2. Advanced senescent change, stable since prior examination. 3. Remote infarcts within the right basal ganglia, right corona radiata and right pons. 4. Stable borderline ventriculomegaly. Electronically Signed   By: Fidela Salisbury M.D.   On: 06/30/2022 20:47   DG Chest Port 1 View  Result Date: 06/30/2022 CLINICAL DATA:  Sepsis EXAM: PORTABLE CHEST 1 VIEW COMPARISON:  04/14/2022 FINDINGS: The heart size and mediastinal contours are within normal limits. Both lungs are clear. The visualized skeletal structures are unremarkable. IMPRESSION: No active disease. Electronically Signed   By: Fidela Salisbury M.D.   On: 06/30/2022 19:38     Gerrit Heck, MD 07/01/2022, 11:38 AM  PGY-2, [place] Intern pager: [phone], text pages welcome Secure chat group [place]

## 2022-07-01 NOTE — Evaluation (Signed)
Physical Therapy Evaluation Patient Details Name: Jacob Bass MRN: [id number] DOB: 03-Apr-1961 Today's Date: 07/01/2022  History of Present Illness  Pt is a 61 y.o. male who presented 06/30/22 with AMS, difficulty swallowing, and L sided deficits. MRI was negative for acute intracranial abnormality but did show worsening of chronic small vessel disease progressing into right deep gray nuclei and brainstem. PMH: dementia, cirrhosis, hepatitis C, previous alcohol abuse, CVA, SAH, COPD   Clinical Impression  Pt presents with condition above and deficits mentioned below, see PT Problem List. Per chart, pt has been residing at [facility] His PLOF is unclear as no one is present to provide that info and pt is confused and only stating "huh" and "ow". Pt likely required assistance for most if not all mobility and ADLs at the SNF per his presentation and that he was requiring TA with PT during his last admission in August 2023. Currently, pt is demonstrating L upper and lower extremity clonus and he is holding his L UE in a flexion synergy position. He is requiring TA for all bed mobility and up to TA to prevent LOB with sitting EOB due to his strong posterior lean. He is not following commands at this time, but can track to the L with extra time when provided loud noises to track. Recommend pt return to his SNF at d/c. Will continue to follow acutely.     Recommendations for follow up therapy are one component of a multi-disciplinary discharge planning process, led by the attending physician.  Recommendations may be updated based on patient status, additional functional criteria and insurance authorization.  Follow Up Recommendations Skilled nursing-short term rehab (<3 hours/day) Can patient physically be transported by private vehicle: No    Assistance Recommended at Discharge Frequent or constant Supervision/Assistance  Patient can return home with the following  Two people to help with walking  and/or transfers;A lot of help with bathing/dressing/bathroom;Assistance with cooking/housework;Assistance with feeding;Direct supervision/assist for medications management;Direct supervision/assist for financial management;Assist for transportation;Help with stairs or ramp for entrance    Equipment Recommendations None recommended by PT  Recommendations for Other Services  Other (comment) (Palliative Consult)    Functional Status Assessment Patient has had a recent decline in their functional status and/or demonstrates limited ability to make significant improvements in function in a reasonable and predictable amount of time     Precautions / Restrictions Precautions Precautions: Fall Restrictions Weight Bearing Restrictions: No      Mobility  Bed Mobility Overal bed mobility: Needs Assistance Bed Mobility: Supine to Sit, Sit to Supine     Supine to sit: Total assist, HOB elevated Sit to supine: Total assist, HOB elevated   General bed mobility comments: TA to transition supine <> sit EOB with no initiation noted by pt, but rather a posterior lean    Transfers                   General transfer comment: deferred for safety due to not following commands    Ambulation/Gait               General Gait Details: deferred  Stairs            Wheelchair Mobility    Modified Rankin (Stroke Patients Only) Modified Rankin (Stroke Patients Only) Pre-Morbid Rankin Score: Severe disability Modified Rankin: Severe disability     Balance Overall balance assessment: Needs assistance Sitting-balance support: No upper extremity supported, Feet unsupported Sitting balance-Leahy Scale: Zero Sitting balance - Comments:  Pt leaning posteriorly, max-TA to sit statically EOB Postural control: Posterior lean     Standing balance comment: deferred                             Pertinent Vitals/Pain Pain Assessment Pain Assessment: Faces Faces Pain  Scale: Hurts little more Pain Location: generalized grimacing with mobility Pain Descriptors / Indicators: Grimacing Pain Intervention(s): Limited activity within patient's tolerance, Monitored during session, Repositioned    Home Living Family/patient expects to be discharged to:: Skilled nursing facility                   Additional Comments: Per chart, from [facility]    Prior Function Prior Level of Function : Patient poor historian/Family not available             Mobility Comments: Unclear of his PLOF as pt is not responding verbally much, but was TA with PT during his last admission In August 2023       Hand Dominance        Extremity/Trunk Assessment   Upper Extremity Assessment Upper Extremity Assessment: Defer to OT evaluation (clonus noted in L UE along with flexion synergy contracture)    Lower Extremity Assessment Lower Extremity Assessment: Generalized weakness;LLE deficits/detail LLE Deficits / Details: clonus > 40 beats noted; withdraws to noxious stimuli distally    Cervical / Trunk Assessment Cervical / Trunk Assessment: Kyphotic  Communication   Communication: Receptive difficulties;Expressive difficulties (not answering much, only saying "huh" and "ow")  Cognition Arousal/Alertness: Awake/alert Behavior During Therapy: Flat affect (smiled 2x) Overall Cognitive Status: No family/caregiver present to determine baseline cognitive functioning                                 General Comments: Not sure of pt's prior cog function as he has a hx of dementia and no one was present to confirm. Pt only stating "huh" and "ow" today and not following simple cues with his limbs or trunk. He did track to loud noises to his L with time. Smiled 2x and would impulsively/restlessly reach to place his hands on therapist or in pockets of therapist when standing on his R        General Comments General comments (skin integrity, edema, etc.):  noted wheezing at rest upon arrival that worsened with mobility    Exercises     Assessment/Plan    PT Assessment Patient needs continued PT services  PT Problem List Decreased strength;Decreased range of motion;Decreased activity tolerance;Decreased balance;Decreased mobility;Decreased coordination;Decreased cognition;Decreased knowledge of use of DME;Decreased safety awareness       PT Treatment Interventions DME instruction;Functional mobility training;Therapeutic activities;Therapeutic exercise;Balance training;Neuromuscular re-education;Cognitive remediation;Patient/family education;Gait training    PT Goals (Current goals can be found in the Care Plan section)  Acute Rehab PT Goals Patient Stated Goal: did not state PT Goal Formulation: Patient unable to participate in goal setting Time For Goal Achievement: 07/15/22 Potential to Achieve Goals: Poor    Frequency Min 2X/week     Co-evaluation               AM-PAC PT "6 Clicks" Mobility  Outcome Measure Help needed turning from your back to your side while in a flat bed without using bedrails?: Total Help needed moving from lying on your back to sitting on the side of a flat bed without using bedrails?:  Total Help needed moving to and from a bed to a chair (including a wheelchair)?: Total Help needed standing up from a chair using your arms (e.g., wheelchair or bedside chair)?: Total Help needed to walk in hospital room?: Total Help needed climbing 3-5 steps with a railing? : Total 6 Click Score: 6    End of Session   Activity Tolerance: Other (comment) (limited by cognition) Patient left: in bed;with call bell/phone within reach;with nursing/sitter in room Nurse Communication: Mobility status;Other (comment) (wheezing) PT Visit Diagnosis: Muscle weakness (generalized) (M62.81);Difficulty in walking, not elsewhere classified (R26.2);Other symptoms and signs involving the nervous system (R29.898)    Time:  5110-2111 PT Time Calculation (min) (ACUTE ONLY): 19 min   Charges:   PT Evaluation $PT Eval Moderate Complexity: 1 Mod          Moishe Spice, PT, DPT Acute Rehabilitation Services  Office: [phone]   Orvan Falconer 07/01/2022, 2:42 PM

## 2022-07-01 NOTE — ED Notes (Signed)
Pt repeatedly rolling to left side, grabbing at equipment with right arm, though weak attempts and pulling does not seem entirely purposeful. Right arm is contracted to chest. Pt will track with eye but does not appear to understand verbal communication, does not follow directions, or try to communicate.

## 2022-07-01 NOTE — Evaluation (Signed)
Clinical/Bedside Swallow Evaluation Patient Details  Name: Jacob Bass MRN: [id number] Date of Birth: 10-10-1960  Today's Date: 07/01/2022 Time: SLP Start Time (ACUTE ONLY): 0850 SLP Stop Time (ACUTE ONLY): 0905 SLP Time Calculation (min) (ACUTE ONLY): 15 min  Past Medical History:  Past Medical History:  Diagnosis Date   CHF (congestive heart failure) ([place])    Hypertension    Past Surgical History:  Past Surgical History:  Procedure Laterality Date   BIOPSY  10/19/2018   Procedure: BIOPSY;  Surgeon: Yetta Flock, MD;  Location: [facility];  Service: Gastroenterology;;   ESOPHAGOGASTRODUODENOSCOPY (EGD) WITH PROPOFOL N/A 10/19/2018   Procedure: ESOPHAGOGASTRODUODENOSCOPY (EGD);  Surgeon: Yetta Flock, MD;  Location: [facility];  Service: Gastroenterology;  Laterality: N/A;   HPI:  Jacob Bass is a 61 y.o. male presenting with Altered mental status likely secondary to progressive of chronic vascular disease. Pertinent PMH/PSH includes prior CVA, subarachnoid hemorrhage, cirrhosis, hep C, hx of etOH abuse, dementia, COPD .  MRI was negative for acute intracranial abnormality.  Pt was most recently seen by ST on 04/13/22 with recommendations for Dysphagia 1 solids and thin liquids.    Assessment / Plan / Recommendation  Clinical Impression  Pt was seen for a bedside swallow evaluation and he presents with suspected moderate oropharyngeal dysphagia.  Pt was lethargic and exhibited difficulty following commands during this evaluation.  He was unable to complete an oral mechanism examination, but he was observed to have lingual weakness and edentulism.  He consumed trials of ice chips, thin liquid and puree.  Suspect prolonged AP transit and delayed swallow initiation with multiple swallows per bolus observed and intermittent immediate throat clearing with thin liquid.  Recommend continuation of NPO with only essential oral medications administered crushed in puree.  SLP will  f/u to determine readiness for clinical diet initiation vs need for instrumental swallow study.  SLP Visit Diagnosis: Dysphagia, oropharyngeal phase (R13.12)    Aspiration Risk  Moderate aspiration risk    Diet Recommendation NPO   Medication Administration: Crushed with puree (essential oral meds only) Postural Changes: Seated upright at 90 degrees    Other  Recommendations Oral Care Recommendations: Oral care QID;Staff/trained caregiver to provide oral care    Recommendations for follow up therapy are one component of a multi-disciplinary discharge planning process, led by the attending physician.  Recommendations may be updated based on patient status, additional functional criteria and insurance authorization.  Follow up Recommendations Skilled nursing-short term rehab (<3 hours/day)      Assistance Recommended at Discharge Frequent or constant Supervision/Assistance  Functional Status Assessment Patient has had a recent decline in their functional status and demonstrates the ability to make significant improvements in function in a reasonable and predictable amount of time.  Frequency and Duration min 2x/week  2 weeks       Prognosis Prognosis for Safe Diet Advancement: Fair Barriers to Reach Goals: Cognitive deficits      Swallow Study   General HPI: Jacob Bass is a 61 y.o. male presenting with Altered mental status likely secondary to progressive of chronic vascular disease. Pertinent PMH/PSH includes prior CVA, subarachnoid hemorrhage, cirrhosis, hep C, hx of etOH abuse, dementia, COPD .  MRI was negative for acute intracranial abnormality.  Pt was most recently seen by ST on 04/13/22 with recommendations for Dysphagia 1 solids and thin liquids. Type of Study: Bedside Swallow Evaluation Previous Swallow Assessment: BSE 04/13/22 Diet Prior to this Study: NPO Temperature Spikes Noted: Yes Respiratory Status: Room air History  of Recent Intubation: No Behavior/Cognition:  Impulsive;Requires cueing Oral Cavity Assessment: Dry Oral Care Completed by SLP: No Oral Cavity - Dentition: Edentulous Self-Feeding Abilities: Needs assist Patient Positioning: Upright in bed Baseline Vocal Quality: Low vocal intensity Volitional Cough: Cognitively unable to elicit Volitional Swallow: Able to elicit    Oral/Motor/Sensory Function Overall Oral Motor/Sensory Function: Generalized oral weakness (pt unable to complete full OME) Facial Symmetry: Within Functional Limits   Ice Chips Ice chips: Impaired Presentation: Spoon Oral Phase Impairments: Reduced lingual movement/coordination Oral Phase Functional Implications: Prolonged oral transit Pharyngeal Phase Impairments: Multiple swallows   Thin Liquid Thin Liquid: Impaired Presentation: Spoon;Straw Pharyngeal  Phase Impairments: Multiple swallows;Suspected delayed Swallow;Throat Clearing - Immediate    Nectar Thick Nectar Thick Liquid: Not tested   Honey Thick Honey Thick Liquid: Not tested   Puree Puree: Impaired Presentation: Spoon Oral Phase Impairments: Reduced lingual movement/coordination Oral Phase Functional Implications: Oral residue;Prolonged oral transit Pharyngeal Phase Impairments: Suspected delayed Swallow;Multiple swallows   Solid     Solid: Not tested     Jacob Bass, M.S., CCC-SLP Acute Rehabilitation Services Office: [phone]  Jacob Bass 07/01/2022,9:20 AM

## 2022-07-01 NOTE — Progress Notes (Signed)
FMTS Interim Progress Note  S:Evaluated at bedside with Dr. Rock Nephew for evening rounds. Patient was very calm without pain or concerns. He had 2 large bowel movements after the lactulose enema.  O: BP (!) 181/78   Pulse 74   Temp 97.6 F (36.4 C) (Oral)   Resp (!) 23   SpO2 100%   General: Alert, responding to questions intermittently, in NAD  Skin: Warm, dry HEENT: NCAT, dry lips Cardiac: RRR, no m/r/g appreciated Respiratory: CTAB anteriorly, breathing and speaking comfortably on RA Abdominal: Soft, nontender, mildly distended, hypoactive bowel sounds Extremities: LLE edematous and erythematous when compared with RLE Neurological/Psychiatric: Not agitated, lying calmly in bed, oriented to person only, smiling intermittently with examiners, appears demented  A/P: AMS Likely 2/2 progression of chronic small vessel disease into brainstem and deep gray nuclei. Appears to be improving since admission where he was less responsive than on my exam. Lactulose enema also was given successfully after failing SLP evaluation for diet. -Delirium and fall precautions -PT/OT eval and treat -Continue NPO for now  Cirrhosis of liver with moderate ascites and fever VSS without further fever. Bcx NGTD. Ascites seen on [place] earlier today. Now on ceftriaxone for SBP prophylaxis. HCV negative. US paracentesis ordered, though will need consent given AMS. -F/u paracentesis per day team -Monitor fever curve  Remainder per day team notes.  Ethelene Hal, MD 07/01/2022, 10:23 PM PGY-1, [place] [facility] Service pager [phone]

## 2022-07-01 NOTE — ED Notes (Addendum)
Pt pulling at chords, while attempting to recheck BP, pt became agitated stating "idk why you're doing this" and attempting to pull pulse ox and BP cuff off, pulling at IV pole. MD Mabe made aware of pts BP and agitation.

## 2022-07-02 ENCOUNTER — Inpatient Hospital Stay (HOSPITAL_COMMUNITY): Payer: Medicaid Other

## 2022-07-02 DIAGNOSIS — R4182 Altered mental status, unspecified: Secondary | ICD-10-CM | POA: Diagnosis not present

## 2022-07-02 DIAGNOSIS — R600 Localized edema: Secondary | ICD-10-CM

## 2022-07-02 LAB — CBC
HCT: 36.8 % — ABNORMAL LOW (ref 39.0–52.0)
Hemoglobin: 12 g/dL — ABNORMAL LOW (ref 13.0–17.0)
MCH: 33.3 pg (ref 26.0–34.0)
MCHC: 32.6 g/dL (ref 30.0–36.0)
MCV: 102.2 fL — ABNORMAL HIGH (ref 80.0–100.0)
Platelets: 78 10*3/uL — ABNORMAL LOW (ref 150–400)
RBC: 3.6 MIL/uL — ABNORMAL LOW (ref 4.22–5.81)
RDW: 14.7 % (ref 11.5–15.5)
WBC: 7.8 10*3/uL (ref 4.0–10.5)
nRBC: 0 % (ref 0.0–0.2)

## 2022-07-02 LAB — ECHOCARDIOGRAM COMPLETE BUBBLE STUDY
Area-P 1/2: 3.53 cm2
Calc EF: 63.4 %
S' Lateral: 3.9 cm
Single Plane A2C EF: 60.1 %
Single Plane A4C EF: 68.1 %

## 2022-07-02 LAB — COMPREHENSIVE METABOLIC PANEL
ALT: 40 U/L (ref 0–44)
AST: 62 U/L — ABNORMAL HIGH (ref 15–41)
Albumin: 1.7 g/dL — ABNORMAL LOW (ref 3.5–5.0)
Alkaline Phosphatase: 71 U/L (ref 38–126)
Anion gap: 5 (ref 5–15)
BUN: 16 mg/dL (ref 8–23)
CO2: 21 mmol/L — ABNORMAL LOW (ref 22–32)
Calcium: 8 mg/dL — ABNORMAL LOW (ref 8.9–10.3)
Chloride: 121 mmol/L — ABNORMAL HIGH (ref 98–111)
Creatinine, Ser: 0.86 mg/dL (ref 0.61–1.24)
GFR, Estimated: >60 mL/min (ref >60)
Glucose, Bld: 95 mg/dL (ref 70–99)
Potassium: 4 mmol/L (ref 3.5–5.1)
Sodium: 147 mmol/L — ABNORMAL HIGH (ref 135–145)
Total Bilirubin: 1.9 mg/dL — ABNORMAL HIGH (ref 0.3–1.2)
Total Protein: 6.3 g/dL — ABNORMAL LOW (ref 6.5–8.1)

## 2022-07-02 MED ORDER — LACTULOSE 10 GM/15ML PO SOLN
20.0000 g | Freq: Every day | ORAL | Status: DC
  Administered 2022-07-02 – 2022-07-03 (×2): 20 g via ORAL
  Filled 2022-07-02 (×2): qty 30

## 2022-07-02 MED ORDER — METOPROLOL TARTRATE 25 MG PO TABS
25.0000 mg | ORAL_TABLET | Freq: Two times a day (BID) | ORAL | Status: DC
  Administered 2022-07-02 – 2022-07-06 (×9): 25 mg via ORAL
  Filled 2022-07-02 (×9): qty 1

## 2022-07-02 MED ORDER — ALBUMIN HUMAN 25 % IV SOLN
100.0000 g | Freq: Once | INTRAVENOUS | Status: AC
  Administered 2022-07-02: 100 g via INTRAVENOUS
  Filled 2022-07-02 (×2): qty 400

## 2022-07-02 MED ORDER — LISINOPRIL 5 MG PO TABS
5.0000 mg | ORAL_TABLET | Freq: Every day | ORAL | Status: DC
  Administered 2022-07-02 – 2022-07-06 (×5): 5 mg via ORAL
  Filled 2022-07-02 (×5): qty 1

## 2022-07-02 MED ORDER — AMLODIPINE BESYLATE 5 MG PO TABS
2.5000 mg | ORAL_TABLET | Freq: Every day | ORAL | Status: DC
  Administered 2022-07-02 – 2022-07-05 (×4): 2.5 mg via ORAL
  Filled 2022-07-02 (×4): qty 1

## 2022-07-02 NOTE — Progress Notes (Signed)
Daily Progress Note Intern Pager: [phone]  Patient name: Jacob Bass Medical record number: [id number] Date of birth: Apr 21, 1961 Age: 61 y.o. Gender: male  Primary Care Provider: Patient, No Pcp Per Consultants: IR Code Status: Full  Pt Overview and Major Events to Date:  10/29 admitted  Assessment and Plan:  Jacob Bass is a 61 y.o. male p/w AMS likely 2/2 progression of chronic vascular disease. Also considering infection, cirrhosis, and dehydration as possible contributors. Pertinent PMHx includes prior CVA, SAH, cirrhosis, hep C, hx etOH use, dementia, COPD.   * Altered mental status Improving since admission. Likely secondary to worsening of chronic small vessel disease per MRI findings. Dehydration vs infection vs sequelae of liver disease could also be contributing. - Delirium and fall precautions - Pursuing infectious workup as discussed below - NPO, repeat swallow screen (can give crushed meds) - Decrease mIVF to 43mL/hr, consider dc'ing once diet is started - PT/OT eval and treat  Cirrhosis of liver without ascites ([place]) In the setting of historical etOH abuse and Hep C in 2020. RUQ u/s with moderate ascites and nodular liver. Ammonia mildly elevated on admission, given lactulose enema. - Paracentesis per IR - will speak with ethics with regards to obtaining consent for this - f/u HCV - Restart home lactulose, PO   Fever Fever to 100.4 on admission. No further fevers since admission. Covid negative. Blood and urine cxs pending. Receiving CTX for SBP ppx and ?UTI given UA. - F/u cxs - Monitor fever curve - Continue Rocephin 2g qd - Paracentesis as above  Poor fluid intake At baseline will take food/fluids when offered.  - swallow eval, fluids as above - Assist with meals once able to take PO  - Once passes swallow screen, will have RN offer 244mL water q4h while awake   Bilateral lower extremity edema Per SNF staff this is a new finding. 2+ pitting  edema to mid-shin bilaterally. Likely 2/2 hepatic dysfunction. Last echo in 2020 with hyperdynamic systolic function with EF >65% and increased LV wall thickness. Mildly dilated LA and mild pulmonary htn.  - F/u Echocardiogram  - Monitor fluid status  COPD (chronic obstructive pulmonary disease) (HCC) Home inhalers include Spiriva, Advair, and prn albuterol. No evidence of exacerbation on exam. - Continue home inhaler regimen  Hypertension On lisinopril, metoprolol, and amlodipine at home. BP moderately elevated. - restart home meds       FEN/GI: NPO pending SLP eval. D5LR IVF PPx: lovenox Dispo:pending clinical improvement   Subjective:  Calm, denies pain, able to state his name and that he is in [place].  Objective: Temp:  [97.4 F (36.3 C)-98.7 F (37.1 C)] 97.4 F (36.3 C) (10/30 0628) Pulse Rate:  [66-119] 66 (10/30 0634) Resp:  [15-23] 16 (10/30 0634) BP: (137-184)/(73-101) 184/82 (10/30 0634) SpO2:  [80 %-100 %] 100 % (10/30 3086) Physical Exam: General: Alert, intermittently responsive, NAD Cardiovascular: RRR no MRG Respiratory: CTAB normal WOB on RA Abdomen: Distended, nontender, no succussion splash appreciated Extremities: LLE edematous compared to RLE, nonpitting  Laboratory: Most recent CBC Lab Results  Component Value Date   WBC 7.8 07/02/2022   HGB 12.0 (L) 07/02/2022   HCT 36.8 (L) 07/02/2022   MCV 102.2 (H) 07/02/2022   PLT 78 (L) 07/02/2022   Most recent BMP    Latest Ref Rng & Units 07/02/2022    4:53 AM  BMP  Glucose 70 - 99 mg/dL 95   BUN 8 - 23 mg/dL 16   Creatinine  0.61 - 1.24 mg/dL 2.29   Sodium 798 - 921 mmol/L 147   Potassium 3.5 - 5.1 mmol/L 4.0   Chloride 98 - 111 mmol/L 121   CO2 22 - 32 mmol/L 21   Calcium 8.9 - 10.3 mg/dL 8.0      Imaging/Diagnostic Tests:  MR BRAIN W WO CONTRAST IMPRESSION: 1. Motion degraded exam despite repeated imaging attempts. No acute intracranial abnormality identified.   2. Advanced  chronic small vessel disease, progressed since 2020 and severe in the right deep gray nuclei and brainstem.   [place] ASCITES IMPRESSION: Moderate ascites identified in all 4 quadrants. The liver demonstrates a nodular contour suggesting cirrhosis.  Vonna Drafts, MD 07/02/2022, 12:45 PM  PGY-1, [facility] FPTS Intern pager: [phone], text pages welcome Secure chat group [facility] Teaching Service

## 2022-07-02 NOTE — Progress Notes (Signed)
Echocardiogram 2D Echocardiogram has been performed.  Jacob Bass 07/02/2022, 3:00 PM

## 2022-07-02 NOTE — TOC Progression Note (Signed)
Transition of Care [facility]) - Initial/Assessment Note    Patient Details  Name: Jacob Bass MRN: [id number] Date of Birth: September 30, 1960  Transition of Care [facility] CM/SW Contact:    Milinda Antis, [place] Phone Number: 07/02/2022, 2:39 PM  Clinical Narrative:                 LCSW contacted Madelynn Done to inquire about whether the patient was LT or ST at the facility and is waiting a returned call.          Patient Goals and CMS Choice        Expected Discharge Plan and Services                                                Prior Living Arrangements/Services                       Activities of Daily Living      Permission Sought/Granted                  Emotional Assessment              Admission diagnosis:  Altered mental status [R41.82] Encephalopathy [G93.40] Altered mental status, unspecified altered mental status type [R41.82] Patient Active Problem List   Diagnosis Date Noted   Encephalopathy 07/01/2022   Altered mental status 06/30/2022   Bilateral lower extremity edema 06/30/2022   Fever 06/30/2022   Poor fluid intake 06/30/2022   Hypoglycemia    Metabolic encephalopathy    Goals of care, counseling/discussion    Failure to thrive in adult 04/12/2022   Hypokalemia    Generalized weakness    Hypomagnesemia    COPD (chronic obstructive pulmonary disease) ([place])    Dysphagia    Fall    Gastric ulcer    Cerebral thrombosis with cerebral infarction 10/13/2018   Cerebral embolism with cerebral infarction 10/13/2018   Subarachnoid hemorrhage 10/13/2018   Intracerebral hemorrhage 10/13/2018   Heme positive stool    Cirrhosis of liver without ascites ([place])    Chest pain 10/09/2018   Elevated troponin 10/09/2018   EKG abnormalities 10/09/2018   Homelessness 10/09/2018   Hypertension 10/09/2018   Anemia, macrocytic 10/09/2018   Thrombocytopenia ([place]) 10/09/2018   PCP:  Patient, No Pcp Per Pharmacy:   [facility] [place] [address] [place] [address] Phone: [phone] Fax: [phone]     Social Determinants of Health (SDOH) Interventions    Readmission Risk Interventions     No data to display

## 2022-07-02 NOTE — Evaluation (Signed)
Occupational Therapy Evaluation Patient Details Name: Jacob Bass MRN: [id number] DOB: 1961/07/08 Today's Date: 07/02/2022   History of Present Illness Pt is a 61 y.o. male who presented 06/30/22 with AMS, difficulty swallowing, and L sided deficits. MRI was negative for acute intracranial abnormality but did show worsening of chronic small vessel disease progressing into right deep gray nuclei and brainstem. PMH: dementia, cirrhosis, hepatitis C, previous alcohol abuse, CVA, SAH, COPD   Clinical Impression   Pt was evaluated s/p the above admission list, he present from SNF with unknown LOA. Attempted to call facility listed in chart with no answer. Limited evaluated due to confusion and agitation. Overall pt currently requires total A for all aspects of his care at bed level. No initiation of purposeful movement noted, no command following throughout. Pt did state his name and "2006." LUE in flexed posturing and noted to have increased agitation with PROM attempts, however no response to noxious stim on L extremities. OT to continue to follow with the assumption pt is not at his baseline. Recommend d/c back to SNF.      Recommendations for follow up therapy are one component of a multi-disciplinary discharge planning process, led by the attending physician.  Recommendations may be updated based on patient status, additional functional criteria and insurance authorization.   Follow Up Recommendations  Skilled nursing-short term rehab (<3 hours/day)    Assistance Recommended at Discharge Frequent or constant Supervision/Assistance  Patient can return home with the following A lot of help with walking and/or transfers;A lot of help with bathing/dressing/bathroom;Direct supervision/assist for medications management;Direct supervision/assist for financial management;Assist for transportation;Help with stairs or ramp for entrance    Functional Status Assessment  Patient has had a recent decline in  their functional status and demonstrates the ability to make significant improvements in function in a reasonable and predictable amount of time.  Equipment Recommendations  None recommended by OT    Recommendations for Other Services       Precautions / Restrictions Precautions Precautions: Fall Restrictions Weight Bearing Restrictions: No      Mobility Bed Mobility Overal bed mobility: Needs Assistance Bed Mobility: Rolling           General bed mobility comments: total A, attempted to sit EOB. pt became agitated, returned supine    Transfers                   General transfer comment: deferred for safety      Balance Overall balance assessment: Needs assistance   Sitting balance-Leahy Scale: Zero               ADL either performed or assessed with clinical judgement   ADL Overall ADL's : Needs assistance/impaired             General ADL Comments: total A at bed level for all tasks due to cognition     Vision Baseline Vision/History: 0 No visual deficits Vision Assessment?: Vision impaired- to be further tested in functional context Additional Comments: difficult to assess. pt not tracking or sustatining visual attention            Pertinent Vitals/Pain Pain Assessment Pain Assessment: Faces Faces Pain Scale: Hurts a little bit Pain Location: with LUE ROM Pain Descriptors / Indicators: Grimacing Pain Intervention(s): Limited activity within patient's tolerance, Monitored during session     Hand Dominance Right   Extremity/Trunk Assessment Upper Extremity Assessment Upper Extremity Assessment: LUE deficits/detail RUE Deficits / Details: moves spontaneously, not to command.  difficult to assess LUE Deficits / Details: protective flexed postureing. unsure if arm is contracted, pt not allowing assessment. did not respond to noxious stim but did grimace to PROM LUE Sensation: decreased light touch;decreased proprioception LUE  Coordination: decreased fine motor;decreased gross motor   Lower Extremity Assessment Lower Extremity Assessment: Defer to PT evaluation   Cervical / Trunk Assessment Cervical / Trunk Assessment: Kyphotic   Communication Communication Communication: Receptive difficulties;Expressive difficulties (only a few verbalizations made)   Cognition Arousal/Alertness: Awake/alert Behavior During Therapy: Impulsive, Agitated Overall Cognitive Status: No family/caregiver present to determine baseline cognitive functioning                 General Comments: unsure of baseline. per chart pt came to [facility] for AMS, therefore assume pt's current preesentation is not baseline. Stated "2006" as year, stated name but not birthday     General Comments  audible breathing upon arrival, VSS            Home Living Family/patient expects to be discharged to:: Skilled nursing facility                                 Additional Comments: Per chart, from [facility] SNF      Prior Functioning/Environment Prior Level of Function : Patient poor historian/Family not available                        OT Problem List: Decreased strength;Decreased range of motion;Decreased activity tolerance;Impaired balance (sitting and/or standing);Decreased cognition;Decreased safety awareness;Decreased knowledge of use of DME or AE;Decreased knowledge of precautions;Impaired UE functional use;Pain;Impaired tone      OT Treatment/Interventions: Self-care/ADL training;Therapeutic exercise;DME and/or AE instruction;Therapeutic activities;Patient/family education;Balance training    OT Goals(Current goals can be found in the care plan section) Acute Rehab OT Goals Patient Stated Goal: unable to state OT Goal Formulation: With patient Time For Goal Achievement: 07/16/22 Potential to Achieve Goals: Good ADL Goals Pt Will Perform Grooming: with min assist;sitting Pt Will Perform Upper Body  Dressing: with set-up;sitting Pt Will Perform Lower Body Dressing: with mod assist;sit to/from stand Pt Will Transfer to Toilet: with mod assist;ambulating;bedside commode Additional ADL Goal #1: Pt will follow 50% of commands during session  OT Frequency: Min 2X/week       AM-PAC OT "6 Clicks" Daily Activity     Outcome Measure Help from another person eating meals?: Total Help from another person taking care of personal grooming?: Total Help from another person toileting, which includes using toliet, bedpan, or urinal?: Total Help from another person bathing (including washing, rinsing, drying)?: Total Help from another person to put on and taking off regular upper body clothing?: Total Help from another person to put on and taking off regular lower body clothing?: Total 6 Click Score: 6   End of Session Nurse Communication: Mobility status  Activity Tolerance: Patient tolerated treatment well Patient left: in bed;with call bell/phone within reach;with nursing/sitter in room  OT Visit Diagnosis: Unsteadiness on feet (R26.81);Other abnormalities of gait and mobility (R26.89);Repeated falls (R29.6);Muscle weakness (generalized) (M62.81);Hemiplegia and hemiparesis Hemiplegia - Right/Left: Left Hemiplegia - caused by: Cerebral infarction                Time: 1655-1710 OT Time Calculation (min): 15 min Charges:  OT General Charges $OT Visit: 1 Visit OT Evaluation $OT Eval Moderate Complexity: 1 Mod    Janara Klett D Causey 07/02/2022, 5:29  PM

## 2022-07-02 NOTE — Progress Notes (Signed)
Paged ethics for consult. Patient with cirrhosis, h/o CVA, here for rule out of SBP after fever and acute encephalopathy and inability to take PO thus far per speech evaluation. No family, relatives, friends, or contacts per SNF that he came from. He is unable to consent for himself as he is only oriented to self and sometimes location.   A paracentesis is medically necessary for his care to get a fluid sample to determine if he has a serious bacterial infection that would need long term antibiotic care. Per [place] statute as there is no family, HCPOA, or other friend/contact to consent for him, the attending physician can consent for a deemed medically necessary procedure.   I have paged IR physician to discuss getting procedure today. Proceeding with formal ethics consult as well.  Yehuda Savannah MD

## 2022-07-02 NOTE — Hospital Course (Addendum)
Jacob Bass is a 61 y.o. male who presented with AMS likely 2/2 progressed chronic small vessel disease. PMH is significant for CVA, cirrhosis, HCV, dementia, and SAH. A brief hospital course is below.   AMS likely 2/2 chronic small vessel disease progression Presented from SNF nonverbal and not eating. At baseline, he is minimally verbal and will eat when given food. VSS. Exam was remarkable for intermittent responses and LLE ankle clonus. CT head with advanced senescent change. MRI brain with progressed chronic small vessel disease into brainstem and gray nuclei but without acute abnormality. AMS improved 1 day after admission with fluids and abx (see below). At discharge, pt neuro status was improved to baseline.    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 Per chart review was referred to ID for hep C treatment but unclear if he ever received treatment. HCV RNA wnl. Mildly elevated ammonia on admission, but low suspicion that hepatic encephalopathy was contributing to his overall presentation. Patient had a distended abdomen and paracentesis was attempted by IR on 10/30; however, patient was unable to tolerate so it was not completed. He remained stable and was treated for SBP as below.   Fever  Presumed SBP Fever to 100.4 and leukocytosis to 12.6 on admission. UA with possible UTI, urine culture negative. Attempted paracentesis but pt unable to cooperate. Treated for presumed SBP with Rocephin and ciprofloxacin for 5 day course. At discharge, pt was afebrile >48hrs and mental status improved. Discharged on Ciprofloxacin for SBP prophylaxis.   Bilateral lower extremity edema Per SNF staff this is a new finding. 2+ pitting edema to mid-shin bilaterally. Likely 2/2 hepatic dysfunction. Echo with EF 60-65%, mild RV enlargement, mild PAH. Left leg appeared slightly larger, but doppler was negative for DVT. Stable and nontender throughout admission.    AKI Cr elevated during admission, most likely due to dehydration in setting of poor PO intake. Pt was treated w/ IV fluids and encouraged PO as mental status improved. Will need Cr recheck at SNF.  Chronic and stable conditions: COPD: continued home spiriva, advair, prn albuterol. HTN: continued home lisinopril, metoprolol, and amlodipine.

## 2022-07-02 NOTE — Progress Notes (Signed)
Speech Language Pathology Treatment: Dysphagia  Patient Details Name: Jacob Bass MRN: [id number] DOB: Feb 10, 1961 Today's Date: 07/02/2022 Time: 7847-8412 SLP Time Calculation (min) (ACUTE ONLY): 15 min  Assessment / Plan / Recommendation Clinical Impression  Jacob Bass was much more interactive today.  He is known to this clinician from previous admissions. His swallow function appears to have returned to baseline.  He demonstrated mild oral delays but functional mastication of pureed solids. He has an audible swallow with frequent belching, but no s/s of aspiration. Voice was clear s/p swallow and no coughing after sequential sips of liquids. He ate a container of applesauce with great enthusiasm and swallowed his meds crushed without difficulty. Recommend resuming his baseline diet of dysphagia 1/thin liquids. No further SLP needs.  Our service will sign off.    HPI HPI: Jacob Bass is a 61 y.o. male presenting with Altered mental status likely secondary to progressive of chronic vascular disease. Pertinent PMH/PSH includes prior CVA, subarachnoid hemorrhage, cirrhosis, hep C, hx of etOH abuse, dementia, COPD .  MRI was negative for acute intracranial abnormality.  Pt was most recently seen by ST on 04/13/22 with recommendations for Dysphagia 1 solids and thin liquids.      SLP Plan  All goals met      Recommendations for follow up therapy are one component of a multi-disciplinary discharge planning process, led by the attending physician.  Recommendations may be updated based on patient status, additional functional criteria and insurance authorization.    Recommendations  Diet recommendations: Dysphagia 1 (puree);Thin liquid Liquids provided via: Cup;Straw Medication Administration: Crushed with puree Supervision: Staff to assist with self feeding Compensations: Minimize environmental distractions                Oral Care Recommendations: Oral care BID Follow Up Recommendations:  Skilled nursing-short term rehab (<3 hours/day) Assistance recommended at discharge: Frequent or constant Supervision/Assistance SLP Visit Diagnosis: Dysphagia, oropharyngeal phase (R13.12) Plan: All goals met         Mattix Imhof L. Tivis Ringer, MA CCC/SLP Clinical Specialist - Acute Care SLP Acute Rehabilitation Services Office number [phone]   Juan Quam [name]  07/02/2022, 1:18 PM

## 2022-07-02 NOTE — ED Notes (Signed)
Help change patient placed another brief patient is resting

## 2022-07-02 NOTE — Plan of Care (Signed)

## 2022-07-02 NOTE — Progress Notes (Signed)
Patient brought to IR for paracentesis. Small amount of fluid was seen in the abdomen via [place]. Unfortunately, patient not able to participate with procedure, swatting at personnel's hands, yelling no, and failing to follow simple instructions.  No procedure attempted on this date.  Electronically Signed: Pasty Spillers, PA-C 07/02/2022, 4:22 PM

## 2022-07-03 DIAGNOSIS — R4182 Altered mental status, unspecified: Secondary | ICD-10-CM | POA: Diagnosis not present

## 2022-07-03 LAB — URINE CULTURE: Culture: >=100000 — AB

## 2022-07-03 LAB — CBC
HCT: 30.3 % — ABNORMAL LOW (ref 39.0–52.0)
Hemoglobin: 10.1 g/dL — ABNORMAL LOW (ref 13.0–17.0)
MCH: 33.6 pg (ref 26.0–34.0)
MCHC: 33.3 g/dL (ref 30.0–36.0)
MCV: 100.7 fL — ABNORMAL HIGH (ref 80.0–100.0)
Platelets: 72 10*3/uL — ABNORMAL LOW (ref 150–400)
RBC: 3.01 MIL/uL — ABNORMAL LOW (ref 4.22–5.81)
RDW: 14.5 % (ref 11.5–15.5)
WBC: 5 10*3/uL (ref 4.0–10.5)
nRBC: 0 % (ref 0.0–0.2)

## 2022-07-03 LAB — COMPREHENSIVE METABOLIC PANEL
ALT: 28 U/L (ref 0–44)
AST: 41 U/L (ref 15–41)
Albumin: 3.1 g/dL — ABNORMAL LOW (ref 3.5–5.0)
Alkaline Phosphatase: 45 U/L (ref 38–126)
Anion gap: 6 (ref 5–15)
BUN: 15 mg/dL (ref 8–23)
CO2: 21 mmol/L — ABNORMAL LOW (ref 22–32)
Calcium: 8.1 mg/dL — ABNORMAL LOW (ref 8.9–10.3)
Chloride: 117 mmol/L — ABNORMAL HIGH (ref 98–111)
Creatinine, Ser: 0.82 mg/dL (ref 0.61–1.24)
GFR, Estimated: >60 mL/min (ref >60)
Glucose, Bld: 79 mg/dL (ref 70–99)
Potassium: 3.3 mmol/L — ABNORMAL LOW (ref 3.5–5.1)
Sodium: 144 mmol/L (ref 135–145)
Total Bilirubin: 2 mg/dL — ABNORMAL HIGH (ref 0.3–1.2)
Total Protein: 6.1 g/dL — ABNORMAL LOW (ref 6.5–8.1)

## 2022-07-03 LAB — HCV RNA QUANT
HCV Quantitative Log: 5.365 log10 IU/mL (ref >1.70)
HCV Quantitative: 232000 IU/mL (ref >50)

## 2022-07-03 MED ORDER — LIP MEDEX EX OINT
TOPICAL_OINTMENT | CUTANEOUS | Status: DC | PRN
  Administered 2022-07-03: 75 via TOPICAL
  Filled 2022-07-03: qty 7

## 2022-07-03 MED ORDER — POTASSIUM CHLORIDE 20 MEQ PO PACK
40.0000 meq | PACK | Freq: Once | ORAL | Status: AC
  Administered 2022-07-03: 40 meq via ORAL
  Filled 2022-07-03: qty 2

## 2022-07-03 MED ORDER — LACTULOSE ENEMA
300.0000 mL | Freq: Once | ORAL | Status: AC
  Administered 2022-07-04: 300 mL via RECTAL
  Filled 2022-07-03 (×2): qty 300

## 2022-07-03 MED ORDER — LACTULOSE 10 GM/15ML PO SOLN
20.0000 g | Freq: Two times a day (BID) | ORAL | Status: DC
  Administered 2022-07-03 – 2022-07-06 (×6): 20 g via ORAL
  Filled 2022-07-03 (×6): qty 30

## 2022-07-03 NOTE — Plan of Care (Signed)
  Problem: Education: Goal: Knowledge of General Education information will improve Description: Including pain rating scale, medication(s)/side effects and non-pharmacologic comfort measures Outcome: Progressing   Problem: Health Behavior/Discharge Planning: Goal: Ability to manage health-related needs will improve Outcome: Progressing   Problem: Clinical Measurements: Goal: Ability to maintain clinical measurements within normal limits will improve Outcome: Progressing Goal: Will remain free from infection Outcome: Progressing Goal: Diagnostic test results will improve Outcome: Progressing Goal: Respiratory complications will improve Outcome: Progressing Goal: Cardiovascular complication will be avoided Outcome: Progressing   Problem: Activity: Goal: Risk for activity intolerance will decrease Outcome: Not Progressing   Problem: Nutrition: Goal: Adequate nutrition will be maintained Outcome: Progressing   Problem: Coping: Goal: Level of anxiety will decrease Outcome: Progressing   Problem: Elimination: Goal: Will not experience complications related to bowel motility Outcome: Progressing Goal: Will not experience complications related to urinary retention Outcome: Progressing

## 2022-07-03 NOTE — Progress Notes (Signed)
Daily Progress Note Intern Pager: [phone]  Patient name: Jacob Bass Medical record number: [id number] Date of birth: 11/11/1960 Age: 61 y.o. Gender: male  Primary Care Provider: Patient, No Pcp Per Consultants: IR, ethics Code Status: Full  Pt Overview and Major Events to Date:  10/29-admitted 10/30-unable to cooperate with paracentesis  Assessment and Plan:  Jacob Bass is a 62 y.o. male presenting with AMS likely secondary to progression of chronic vascular disease.  Also considering infection, cirrhosis, dehydration as possible contributors.  AMS now improved.  Pertinent PMH includes prior CVA, SAH, cirrhosis, hep C, history of ethanol use, dementia, COPD.    * Altered mental status Improving since admission. Likely secondary to worsening of chronic small vessel disease per MRI findings. Dehydration vs infection vs sequelae of liver disease could also be contributing. - Delirium and fall precautions - Pursuing infectious workup as discussed below -Dysphagia 1/thin liquids, crushed meds - PT/OT eval and treat  Cirrhosis of liver without ascites ([place]) In the setting of historical etOH abuse and Hep C in 2020. RUQ u/s with moderate ascites and nodular liver. Ammonia mildly elevated on admission, low suspicion for hepatic encephalopathy. Unable to tolerate paracentesis 10/30. Will hold off on repeat attempt given improvement in mental status and concurrent treatment for SBP - f/u HCV -Increase p.o. lactulose to BID given no BM yet   Fever Fever to 100.4 on admission. No further fevers since admission. Covid negative. Blood culture NGTD. Receiving CTX for SBP ppx and ?UTI given UA, urine culture pending. - F/u cxs - Monitor fever curve - Continue Rocephin 2g qd, likely transition to PO abx tomorrow  Poor fluid intake At baseline will take food/fluids when offered.  -Diet as above - Assist with meals once able to take PO  -Please offer 240mL fluids q4h as  tolerated    Bilateral lower extremity edema Per SNF staff this is a new finding. 2+ pitting edema to mid-shin bilaterally. Likely 2/2 hepatic dysfunction.  Echo with EF 60-65%, mild RV enlargement, mild PAH. - Monitor fluid status  COPD (chronic obstructive pulmonary disease) (HCC) Home inhalers include Spiriva, Advair, and prn albuterol. No evidence of exacerbation on exam. - Continue home inhaler regimen  Hypertension On lisinopril, metoprolol, and amlodipine at home. BP moderately elevated. -Continue home meds    Electrolytes-K repleted   FEN/GI: NPO pending SLP eval. D5LR IVF PPx: lovenox Dispo: SNF pending improvement, receiving abx  Subjective:  Awake this morning.  Responds to questions.  States he is feeling okay this morning.  Denies pain, SOB.  Objective: Temp:  [97.4 F (36.3 C)-98.7 F (37.1 C)] 98 F (36.7 C) (10/31 0813) Pulse Rate:  [62-75] 66 (10/31 0813) Resp:  [16-19] 16 (10/31 0813) BP: (146-176)/(83-94) 176/83 (10/31 0813) SpO2:  [100 %] 100 % (10/31 0813) Weight:  [71.7 kg] 71.7 kg (10/30 1359) Physical Exam: General: NAD, alert Cardiovascular: RRR no MRG Respiratory: Audible upper airway wheeze on expiration, otherwise CTAB normal WOB on RA Abdomen: Distended, nontender  Laboratory: Most recent CBC Lab Results  Component Value Date   WBC 5.0 07/03/2022   HGB 10.1 (L) 07/03/2022   HCT 30.3 (L) 07/03/2022   MCV 100.7 (H) 07/03/2022   PLT 72 (L) 07/03/2022   Most recent BMP    Latest Ref Rng & Units 07/03/2022    4:42 AM  BMP  Glucose 70 - 99 mg/dL 79   BUN 8 - 23 mg/dL 15   Creatinine 0.61 - 1.24 mg/dL 0.82  Sodium 135 - 145 mmol/L 144   Potassium 3.5 - 5.1 mmol/L 3.3   Chloride 98 - 111 mmol/L 117   CO2 22 - 32 mmol/L 21   Calcium 8.9 - 10.3 mg/dL 8.1       Imaging/Diagnostic Tests:  IR U/S Abd IMPRESSION: Small amount of lower abdominopelvic ascites by ultrasound.  August Albino, MD 07/03/2022, 12:46 PM  PGY-1,  [place] Intern pager: [phone], text pages welcome Secure chat group [place]

## 2022-07-03 NOTE — TOC Progression Note (Signed)
Transition of Care [facility]) - Initial/Assessment Note    Patient Details  Name: Jacob Bass MRN: [id number] Date of Birth: 01-04-1961  [facility] CM/SW Contact:    Milinda Antis, [place] Phone Number: 07/03/2022, 11:01 AM  Clinical Narrative:                 LCSW received a returned call from [facility] (SNF) verifying that the patient is LT at the facility and can return when medically ready.   TOC will continue to follow.          Patient Goals and CMS Choice        Expected Discharge Plan and Services                                                Prior Living Arrangements/Services                       Activities of Daily Living Home Assistive Devices/Equipment: None ADL Screening (condition at time of admission) Patient's cognitive ability adequate to safely complete daily activities?: No Is the patient deaf or have difficulty hearing?: No Does the patient have difficulty seeing, even when wearing glasses/contacts?: No Does the patient have difficulty concentrating, remembering, or making decisions?: Yes Patient able to express need for assistance with ADLs?: No Does the patient have difficulty dressing or bathing?: Yes Independently performs ADLs?: No Communication: Dependent Is this a change from baseline?: Change from baseline, expected to last >3 days Dressing (OT): Dependent Is this a change from baseline?: Change from baseline, expected to last >3 days Grooming: Dependent Is this a change from baseline?: Change from baseline, expected to last >3 days Feeding: Dependent Is this a change from baseline?: Change from baseline, expected to last >3 days Bathing: Dependent Is this a change from baseline?: Change from baseline, expected to last >3 days Toileting: Dependent Is this a change from baseline?: Change from baseline, expected to last >3days In/Out Bed: Dependent Is this a change from baseline?: Change from baseline,  expected to last >3 days Walks in Home: Dependent Is this a change from baseline?: Change from baseline, expected to last >3 days Does the patient have difficulty walking or climbing stairs?: Yes Weakness of Legs: Both Weakness of Arms/Hands: Both  Permission Sought/Granted                  Emotional Assessment              Admission diagnosis:  Altered mental status [R41.82] Encephalopathy [G93.40] Altered mental status, unspecified altered mental status type [R41.82] Patient Active Problem List   Diagnosis Date Noted   Encephalopathy 07/01/2022   Altered mental status 06/30/2022   Bilateral lower extremity edema 06/30/2022   Fever 06/30/2022   Poor fluid intake 06/30/2022   Hypoglycemia    Metabolic encephalopathy    Goals of care, counseling/discussion    Failure to thrive in adult 04/12/2022   Hypokalemia    Generalized weakness    Hypomagnesemia    COPD (chronic obstructive pulmonary disease) ([place])    Dysphagia    Fall    Gastric ulcer    Cerebral thrombosis with cerebral infarction 10/13/2018   Cerebral embolism with cerebral infarction 10/13/2018   Subarachnoid hemorrhage 10/13/2018   Intracerebral hemorrhage 10/13/2018   Heme positive stool  Cirrhosis of liver without ascites (HCC)    Chest pain 10/09/2018   Elevated troponin 10/09/2018   EKG abnormalities 10/09/2018   Homelessness 10/09/2018   Hypertension 10/09/2018   Anemia, macrocytic 10/09/2018   Thrombocytopenia ([place]) 10/09/2018   PCP:  Patient, No Pcp Per Pharmacy:   [facility] [place] [address] [place] [address] Phone: [phone] Fax: [phone]     Social Determinants of Health ([place]) Interventions    Readmission Risk Interventions     No data to display

## 2022-07-04 DIAGNOSIS — G9340 Encephalopathy, unspecified: Secondary | ICD-10-CM | POA: Diagnosis not present

## 2022-07-04 DIAGNOSIS — K7460 Unspecified cirrhosis of liver: Secondary | ICD-10-CM | POA: Diagnosis not present

## 2022-07-04 DIAGNOSIS — I10 Essential (primary) hypertension: Secondary | ICD-10-CM

## 2022-07-04 DIAGNOSIS — J449 Chronic obstructive pulmonary disease, unspecified: Secondary | ICD-10-CM | POA: Diagnosis not present

## 2022-07-04 DIAGNOSIS — R600 Localized edema: Secondary | ICD-10-CM | POA: Diagnosis not present

## 2022-07-04 LAB — CBC
HCT: 35.8 % — ABNORMAL LOW (ref 39.0–52.0)
Hemoglobin: 11.8 g/dL — ABNORMAL LOW (ref 13.0–17.0)
MCH: 33.7 pg (ref 26.0–34.0)
MCHC: 33 g/dL (ref 30.0–36.0)
MCV: 102.3 fL — ABNORMAL HIGH (ref 80.0–100.0)
Platelets: 84 10*3/uL — ABNORMAL LOW (ref 150–400)
RBC: 3.5 MIL/uL — ABNORMAL LOW (ref 4.22–5.81)
RDW: 14.6 % (ref 11.5–15.5)
WBC: 6.1 10*3/uL (ref 4.0–10.5)
nRBC: 0 % (ref 0.0–0.2)

## 2022-07-04 LAB — MAGNESIUM: Magnesium: 1.9 mg/dL (ref 1.7–2.4)

## 2022-07-04 LAB — BASIC METABOLIC PANEL
Anion gap: 7 (ref 5–15)
BUN: 15 mg/dL (ref 8–23)
CO2: 19 mmol/L — ABNORMAL LOW (ref 22–32)
Calcium: 8.2 mg/dL — ABNORMAL LOW (ref 8.9–10.3)
Chloride: 121 mmol/L — ABNORMAL HIGH (ref 98–111)
Creatinine, Ser: 0.81 mg/dL (ref 0.61–1.24)
GFR, Estimated: >60 mL/min (ref >60)
Glucose, Bld: 70 mg/dL (ref 70–99)
Potassium: 3.9 mmol/L (ref 3.5–5.1)
Sodium: 147 mmol/L — ABNORMAL HIGH (ref 135–145)

## 2022-07-04 LAB — GLUCOSE, CAPILLARY: Glucose-Capillary: 86 mg/dL (ref 70–99)

## 2022-07-04 MED ORDER — CIPROFLOXACIN HCL 500 MG PO TABS
500.0000 mg | ORAL_TABLET | Freq: Two times a day (BID) | ORAL | Status: AC
  Administered 2022-07-05 (×2): 500 mg via ORAL
  Filled 2022-07-04 (×2): qty 1

## 2022-07-04 NOTE — Progress Notes (Signed)
Physical Therapy Treatment Patient Details Name: Jacob Bass MRN: [id number] DOB: 1961-03-12 Today's Date: 07/04/2022   History of Present Illness Pt is a 61 y.o. male who presented 06/30/22 with AMS, difficulty swallowing, and L sided deficits. MRI was negative for acute intracranial abnormality but did show worsening of chronic small vessel disease progressing into right deep gray nuclei and brainstem. PMH: dementia, cirrhosis, hepatitis C, previous alcohol abuse, CVA, SAH, COPD    PT Comments    Continues to be limited by impaired cognition and L sided weakness. Able to move L LE with familiar task vs on command. Patient required max-totalA+2 for bed mobility. Initially requiring totalA to maintain sitting balance due to heavy pushing posteriorly. With removal of back support, patient laid trunk back onto bed but able to problem use of R UE to pull self forward vs push posteriorly. He performed this x 5 as if performing sit ups. Unsure of baseline at [facility]. Continue to recommend SNF for ongoing Physical Therapy.       Recommendations for follow up therapy are one component of a multi-disciplinary discharge planning process, led by the attending physician.  Recommendations may be updated based on patient status, additional functional criteria and insurance authorization.  Follow Up Recommendations  Skilled nursing-short term rehab (<3 hours/day) Can patient physically be transported by private vehicle: No   Assistance Recommended at Discharge Frequent or constant Supervision/Assistance  Patient can return home with the following Two people to help with walking and/or transfers;A lot of help with bathing/dressing/bathroom;Assistance with cooking/housework;Assistance with feeding;Direct supervision/assist for medications management;Direct supervision/assist for financial management;Assist for transportation;Help with stairs or ramp for entrance   Equipment Recommendations  None recommended by PT     Recommendations for Other Services Other (comment) (Palliative Consult)     Precautions / Restrictions Precautions Precautions: Fall Restrictions Weight Bearing Restrictions: No     Mobility  Bed Mobility Overal bed mobility: Needs Assistance Bed Mobility: Supine to Sit, Sit to Supine     Supine to sit: +2 for physical assistance, Max assist Sit to supine: Total assist, +2 for physical assistance   General bed mobility comments: able to initiate bringing LEs to EOB and trunk elevation with maxA+2 to complete. TotalA+2 to return to supine    Transfers                        Ambulation/Gait                   Stairs             Wheelchair Mobility    Modified Rankin (Stroke Patients Only)       Balance Overall balance assessment: Needs assistance Sitting-balance support: Single extremity supported, Feet supported Sitting balance-Leahy Scale: Poor Sitting balance - Comments: pushing with R hand against bedrail into posterior lean requiring totalA to maintain. With removal of posterior support, patient laid flat on bed but able to utilize R UE to assist self back into sitting with close min guard (without physical touch to limit tactile stimulation). Intermittent times of pushing self posteriorly and coming back into sitting Postural control: Posterior lean                                  Cognition Arousal/Alertness: Awake/alert Behavior During Therapy: Impulsive, Agitated Overall Cognitive Status: No family/caregiver present to determine baseline cognitive functioning  General Comments: unsure of baseline. limited answers to questions and commands        Exercises Other Exercises Other Exercises: sit ups x 5 during session with patient problem solving environment and use of R UE to pull self forward vs pushing self backwards    General Comments        Pertinent  Vitals/Pain Pain Assessment Pain Assessment: Faces Faces Pain Scale: No hurt Pain Intervention(s): Monitored during session    Home Living                          Prior Function            PT Goals (current goals can now be found in the care plan section) Acute Rehab PT Goals Patient Stated Goal: did not state PT Goal Formulation: Patient unable to participate in goal setting Time For Goal Achievement: 07/15/22 Potential to Achieve Goals: Poor Progress towards PT goals: Progressing toward goals    Frequency    Min 2X/week      PT Plan Current plan remains appropriate    Co-evaluation              AM-PAC PT "6 Clicks" Mobility   Outcome Measure  Help needed turning from your back to your side while in a flat bed without using bedrails?: Total Help needed moving from lying on your back to sitting on the side of a flat bed without using bedrails?: Total Help needed moving to and from a bed to a chair (including a wheelchair)?: Total Help needed standing up from a chair using your arms (e.g., wheelchair or bedside chair)?: Total Help needed to walk in hospital room?: Total Help needed climbing 3-5 steps with a railing? : Total 6 Click Score: 6    End of Session   Activity Tolerance: Other (comment) (Limited by cognition) Patient left: in bed;with call bell/phone within reach;with bed alarm set Nurse Communication: Mobility status PT Visit Diagnosis: Muscle weakness (generalized) (M62.81);Difficulty in walking, not elsewhere classified (R26.2);Other symptoms and signs involving the nervous system (R29.898)     Time: 1445-1500 PT Time Calculation (min) (ACUTE ONLY): 15 min  Charges:  $Therapeutic Activity: 8-22 mins                     Remonia Otte A. Gilford Rile PT, DPT Acute Rehabilitation Services Office [phone]    Linna Hoff 07/04/2022, 4:30 PM

## 2022-07-04 NOTE — Progress Notes (Signed)
Daily Progress Note Intern Pager: [phone]  Patient name: Jacob Bass Medical record number: [id number] Date of birth: [birth year] Age: 62 y.o. Gender: male  Primary Care Provider: Patient, No Pcp Per Consultants: IR, ethics Code Status: Full  Pt Overview and Major Events to Date:  10/29-admitted 10/30-unable to cooperate with paracentesis  Assessment and Plan: Jacob Bass is a 61 y.o. male presenting with AMS likely secondary to progression of chronic vascular disease. SBP and hepatic encephalopathy are also considered and are being managed respectively. Overall, pt is seemingly at neurologic baseline and is medically stable for discharge to SNF, pending placement.  Pertinent PMH includes prior CVA, SAH, cirrhosis, hep C, history of ethanol use, dementia, COPD.  * Altered mental status Seemingly back to baseline and stable. Oriented to person only. Most likely due to chronic small vessel dz noted on MRI, but also treating for potential hepatic encephalopathy and potential SBP.  - Start Cipro 500 BID tomorrow for 1 day (Total 5 day antibx course for presumed SBP), then transition to cipro prophylactic dosing  - Lactulose PO and enema to achieve BM. No BM yet. - Delirium and fall precautions - PT/OT  Cirrhosis of liver without ascites (HCC) Abm distended, but nontender. No BM yet. - Cont Lactulose as above  Fever-resolved as of 07/04/2022 Afebril >48hrs  Poor fluid intake At baseline will take food/fluids when offered.  - Assist with meals once able to take PO  - Please offer 256mL fluids q4h as tolerated  Bilateral lower extremity edema Trace edema and stable. - Negative for DVT  COPD (chronic obstructive pulmonary disease) (HCC) - Continue home inhaler regimen (Spiriva, Advair, and prn albuterol)  Hypertension On lisinopril, metoprolol, and amlodipine at home. BP moderately elevated. -Continue home meds   FEN/GI: Dys 1 diet  PPx: lovenox Dispo:SNF tomorrow.  Barriers include need for iv antibiotics.   Subjective:  NAEO. Denies any leg pain.  Objective: Temp:  [97.5 F (36.4 C)-98.9 F (37.2 C)] 97.5 F (36.4 C) (11/01 0850) Pulse Rate:  [61-67] 66 (11/01 0850) Resp:  [16-20] 20 (11/01 0850) BP: (153-183)/(71-88) 157/76 (11/01 0850) SpO2:  [100 %] 100 % (11/01 0850) Physical Exam: General: Alert, delightfully altered. Sitting comfortably in bed. NAD Neuro: Alert and oriented to first name. Not oriented to last name, place, or time. Unable to assess asterixis due to patient not able to follow instructions.  Cardiovascular: RRR. 2+ dorsalis pedis pulses BL.  Respiratory: Normal WOB on RA Abdomen: Tight, distended. Nontender, normal BS. Extremities: Trace pitting edema in BL LE. L LE looks larger and feels tight, but is nontender.  Laboratory: Most recent CBC Lab Results  Component Value Date   WBC 6.1 07/04/2022   HGB 11.8 (L) 07/04/2022   HCT 35.8 (L) 07/04/2022   MCV 102.3 (H) 07/04/2022   PLT 84 (L) 07/04/2022   Most recent BMP    Latest Ref Rng & Units 07/04/2022    5:15 AM  BMP  Glucose 70 - 99 mg/dL 70   BUN 8 - 23 mg/dL 15   Creatinine 0.61 - 1.24 mg/dL 0.81   Sodium 135 - 145 mmol/L 147   Potassium 3.5 - 5.1 mmol/L 3.9   Chloride 98 - 111 mmol/L 121   CO2 22 - 32 mmol/L 19   Calcium 8.9 - 10.3 mg/dL 8.2     Other pertinent labs  Mg 1.9   Arlyce Dice, MD 07/04/2022, 11:25 AM  PGY-1, [place] Intern pager: [phone],  text pages welcome Secure chat group [place]

## 2022-07-04 NOTE — Progress Notes (Signed)
Enema done  patient tolerated well

## 2022-07-04 NOTE — Progress Notes (Signed)
Enema unable to be given on this shift due to wrong supplies being delivered and materials being notified again and still have not received the retention enema kit.   Jacob Bass

## 2022-07-05 DIAGNOSIS — R4182 Altered mental status, unspecified: Secondary | ICD-10-CM | POA: Diagnosis not present

## 2022-07-05 LAB — CBC
HCT: 35.3 % — ABNORMAL LOW (ref 39.0–52.0)
Hemoglobin: 11.7 g/dL — ABNORMAL LOW (ref 13.0–17.0)
MCH: 33.2 pg (ref 26.0–34.0)
MCHC: 33.1 g/dL (ref 30.0–36.0)
MCV: 100.3 fL — ABNORMAL HIGH (ref 80.0–100.0)
Platelets: 96 10*3/uL — ABNORMAL LOW (ref 150–400)
RBC: 3.52 MIL/uL — ABNORMAL LOW (ref 4.22–5.81)
RDW: 14.6 % (ref 11.5–15.5)
WBC: 6.8 10*3/uL (ref 4.0–10.5)
nRBC: 0 % (ref 0.0–0.2)

## 2022-07-05 LAB — CULTURE, BLOOD (SINGLE): Culture: NO GROWTH

## 2022-07-05 LAB — BASIC METABOLIC PANEL
Anion gap: 12 (ref 5–15)
BUN: 20 mg/dL (ref 8–23)
CO2: 21 mmol/L — ABNORMAL LOW (ref 22–32)
Calcium: 8.8 mg/dL — ABNORMAL LOW (ref 8.9–10.3)
Chloride: 117 mmol/L — ABNORMAL HIGH (ref 98–111)
Creatinine, Ser: 0.97 mg/dL (ref 0.61–1.24)
GFR, Estimated: >60 mL/min (ref >60)
Glucose, Bld: 94 mg/dL (ref 70–99)
Potassium: 3.7 mmol/L (ref 3.5–5.1)
Sodium: 150 mmol/L — ABNORMAL HIGH (ref 135–145)

## 2022-07-05 LAB — COMPREHENSIVE METABOLIC PANEL
ALT: 35 U/L (ref 0–44)
AST: 57 U/L — ABNORMAL HIGH (ref 15–41)
Albumin: 2.6 g/dL — ABNORMAL LOW (ref 3.5–5.0)
Alkaline Phosphatase: 56 U/L (ref 38–126)
Anion gap: 9 (ref 5–15)
BUN: 19 mg/dL (ref 8–23)
CO2: 21 mmol/L — ABNORMAL LOW (ref 22–32)
Calcium: 8.5 mg/dL — ABNORMAL LOW (ref 8.9–10.3)
Chloride: 120 mmol/L — ABNORMAL HIGH (ref 98–111)
Creatinine, Ser: 0.91 mg/dL (ref 0.61–1.24)
GFR, Estimated: >60 mL/min (ref >60)
Glucose, Bld: 73 mg/dL (ref 70–99)
Potassium: 3.7 mmol/L (ref 3.5–5.1)
Sodium: 150 mmol/L — ABNORMAL HIGH (ref 135–145)
Total Bilirubin: 1.8 mg/dL — ABNORMAL HIGH (ref 0.3–1.2)
Total Protein: 6.3 g/dL — ABNORMAL LOW (ref 6.5–8.1)

## 2022-07-05 MED ORDER — DEXTROSE 5 % IV SOLN: INTRAVENOUS | Status: AC

## 2022-07-05 MED ORDER — TAMSULOSIN HCL 0.4 MG PO CAPS
0.4000 mg | ORAL_CAPSULE | Freq: Every day | ORAL | Status: DC
  Administered 2022-07-05: 0.4 mg via ORAL
  Filled 2022-07-05: qty 1

## 2022-07-05 MED ORDER — ATORVASTATIN CALCIUM 40 MG PO TABS
40.0000 mg | ORAL_TABLET | Freq: Every day | ORAL | Status: DC
  Administered 2022-07-05 – 2022-07-06 (×2): 40 mg via ORAL
  Filled 2022-07-05 (×2): qty 1

## 2022-07-05 MED ORDER — IPRATROPIUM-ALBUTEROL 0.5-2.5 (3) MG/3ML IN SOLN
3.0000 mL | Freq: Four times a day (QID) | RESPIRATORY_TRACT | Status: DC | PRN
  Administered 2022-07-05 (×2): 3 mL via RESPIRATORY_TRACT
  Filled 2022-07-05 (×2): qty 3

## 2022-07-05 MED ORDER — AMLODIPINE BESYLATE 5 MG PO TABS
2.5000 mg | ORAL_TABLET | Freq: Once | ORAL | Status: AC
  Administered 2022-07-05: 2.5 mg via ORAL
  Filled 2022-07-05: qty 1

## 2022-07-05 MED ORDER — AMLODIPINE BESYLATE 5 MG PO TABS
5.0000 mg | ORAL_TABLET | Freq: Every day | ORAL | Status: DC
  Administered 2022-07-06: 5 mg via ORAL
  Filled 2022-07-05: qty 1

## 2022-07-05 MED ORDER — PANTOPRAZOLE SODIUM 40 MG PO TBEC
40.0000 mg | DELAYED_RELEASE_TABLET | Freq: Two times a day (BID) | ORAL | Status: DC
  Administered 2022-07-05 – 2022-07-06 (×3): 40 mg via ORAL
  Filled 2022-07-05 (×3): qty 1

## 2022-07-05 NOTE — TOC Progression Note (Addendum)
Transition of Care [facility]) - Initial/Assessment Note    Patient Details  Name: Jacob Bass MRN: [id number] Date of Birth: 06-21-1961  [facility] CM/SW Contact:    Milinda Antis, [place] Phone Number: 07/05/2022, 1:57 PM  Clinical Narrative:                 LCSW completed chart review and observed in attendings note yesterday and today that patient is medically ready pending SNF placement.  LCSW contacted attending Dr. Adah Salvage and was informed at 13:52 that the plan is to discharge tomorrow.  Patient is from a Long term care facility, [facility], and can return when medically ready.  Attendings informed of this as well.     Patient Goals and CMS Choice        Expected Discharge Plan and Services                                                Prior Living Arrangements/Services                       Activities of Daily Living Home Assistive Devices/Equipment: None ADL Screening (condition at time of admission) Patient's cognitive ability adequate to safely complete daily activities?: No Is the patient deaf or have difficulty hearing?: No Does the patient have difficulty seeing, even when wearing glasses/contacts?: No Does the patient have difficulty concentrating, remembering, or making decisions?: Yes Patient able to express need for assistance with ADLs?: No Does the patient have difficulty dressing or bathing?: Yes Independently performs ADLs?: No Communication: Dependent Is this a change from baseline?: Change from baseline, expected to last >3 days Dressing (OT): Dependent Is this a change from baseline?: Change from baseline, expected to last >3 days Grooming: Dependent Is this a change from baseline?: Change from baseline, expected to last >3 days Feeding: Dependent Is this a change from baseline?: Change from baseline, expected to last >3 days Bathing: Dependent Is this a change from baseline?: Change from baseline, expected to  last >3 days Toileting: Dependent Is this a change from baseline?: Change from baseline, expected to last >3days In/Out Bed: Dependent Is this a change from baseline?: Change from baseline, expected to last >3 days Walks in Home: Dependent Is this a change from baseline?: Change from baseline, expected to last >3 days Does the patient have difficulty walking or climbing stairs?: Yes Weakness of Legs: Both Weakness of Arms/Hands: Both  Permission Sought/Granted                  Emotional Assessment              Admission diagnosis:  Altered mental status [R41.82] Encephalopathy [G93.40] Altered mental status, unspecified altered mental status type [R41.82] Patient Active Problem List   Diagnosis Date Noted   Encephalopathy 07/01/2022   Altered mental status 06/30/2022   Bilateral lower extremity edema 06/30/2022   Poor fluid intake 06/30/2022   Hypoglycemia    Metabolic encephalopathy    Goals of care, counseling/discussion    Failure to thrive in adult 04/12/2022   Hypokalemia    Generalized weakness    Hypomagnesemia    COPD (chronic obstructive pulmonary disease) ([place])    Dysphagia    Fall    Gastric ulcer    Cerebral thrombosis with cerebral infarction 10/13/2018   Cerebral embolism  with cerebral infarction 10/13/2018   Subarachnoid hemorrhage 10/13/2018   Intracerebral hemorrhage 10/13/2018   Heme positive stool    Cirrhosis of liver without ascites ([place])    Chest pain 10/09/2018   Elevated troponin 10/09/2018   EKG abnormalities 10/09/2018   Homelessness 10/09/2018   Hypertension 10/09/2018   Anemia, macrocytic 10/09/2018   Thrombocytopenia ([place]) 10/09/2018   PCP:  Patient, No Pcp Per Pharmacy:   [place], [place] [address] [place] [address] Phone: [phone] Fax: [phone]     Social Determinants of Health ([place]) Interventions    Readmission Risk Interventions     No data  to display

## 2022-07-05 NOTE — Progress Notes (Signed)
Occupational Therapy Treatment Patient Details Name: Jacob Bass MRN: [id number] DOB: Sep 06, 1960 Today's Date: 07/05/2022   History of present illness Pt is a 61 y.o. male who presented 06/30/22 with AMS, difficulty swallowing, and L sided deficits. MRI was negative for acute intracranial abnormality but did show worsening of chronic small vessel disease progressing into right deep gray nuclei and brainstem. PMH: dementia, cirrhosis, hepatitis C, previous alcohol abuse, CVA, SAH, COPD   OT comments  Patient with no real changes from initial eval.  Attempted gentle PROM to LUE without success.  Pain is limiting any ROM to LUE.  Patient adjusted in bed and placed in chair position for lunch.  Patient not initiating any movements, with attempted feeding, patient stating he was not hungry.  Recommend return to SNF and prior level of supports.  OT can screen patient for any changes compared to prior level of functions.     Recommendations for follow up therapy are one component of a multi-disciplinary discharge planning process, led by the attending physician.  Recommendations may be updated based on patient status, additional functional criteria and insurance authorization.    Follow Up Recommendations  Skilled nursing-short term rehab (<3 hours/day)    Assistance Recommended at Discharge Frequent or constant Supervision/Assistance  Patient can return home with the following  A lot of help with walking and/or transfers;A lot of help with bathing/dressing/bathroom;Direct supervision/assist for medications management;Direct supervision/assist for financial management;Assist for transportation;Help with stairs or ramp for entrance   Equipment Recommendations  None recommended by OT    Recommendations for Other Services      Precautions / Restrictions Precautions Precautions: Fall Restrictions Weight Bearing Restrictions: No       Mobility Bed Mobility   Bed Mobility: Rolling Rolling: Max  assist         General bed mobility comments: repositioned in bed and placed to modified chair for lunch    Transfers                         Balance                                           ADL either performed or assessed with clinical judgement   ADL                                         General ADL Comments: total A at bed level for all tasks due to cognition    Extremity/Trunk Assessment              Vision       Perception     Praxis      Cognition Arousal/Alertness: Awake/alert Behavior During Therapy: WFL for tasks assessed/performed Overall Cognitive Status: History of cognitive impairments - at baseline                                          Exercises      Shoulder Instructions       General Comments  VSS    Pertinent Vitals/ Pain       Pain Assessment Pain Assessment: Faces Faces Pain Scale: Hurts little more Pain  Location: with LUE ROM Pain Descriptors / Indicators: Grimacing                                                          Frequency  Min 2X/week        Progress Toward Goals  OT Goals(current goals can now be found in the care plan section)     Acute Rehab OT Goals OT Goal Formulation: With patient Time For Goal Achievement: 07/16/22 Potential to Achieve Goals: Good ADL Goals Pt Will Perform Eating: (P) with min assist;sitting  Plan      Co-evaluation                 AM-PAC OT "6 Clicks" Daily Activity     Outcome Measure   Help from another person eating meals?: Total Help from another person taking care of personal grooming?: Total Help from another person toileting, which includes using toliet, bedpan, or urinal?: Total Help from another person bathing (including washing, rinsing, drying)?: Total Help from another person to put on and taking off regular upper body clothing?: Total Help from another person  to put on and taking off regular lower body clothing?: Total 6 Click Score: 6    End of Session    OT Visit Diagnosis: Unsteadiness on feet (R26.81);Other abnormalities of gait and mobility (R26.89);Repeated falls (R29.6);Muscle weakness (generalized) (M62.81);Hemiplegia and hemiparesis Hemiplegia - Right/Left: Left   Activity Tolerance Patient limited by pain   Patient Left in bed;with call bell/phone within reach;with bed alarm set   Nurse Communication Mobility status        Time: 1610-9604 OT Time Calculation (min): 12 min  Charges: OT General Charges $OT Visit: 1 Visit OT Treatments $Therapeutic Activity: 8-22 mins  07/05/2022  RP, OTR/L  Acute Rehabilitation Services  Office:  [phone]   Suzanna Obey 07/05/2022, 11:53 AM

## 2022-07-05 NOTE — Discharge Summary (Addendum)
Family Medicine Teaching [facility] Discharge Summary  Patient name: Jacob Bass Medical record number: [id number] Date of birth: 1960/11/03 Age: 61 y.o. Gender: male Date of Admission: 06/30/2022  Date of Discharge: 07/05/22 Admitting Physician: Billey Co, MD  Primary Care Provider: Patient, No Pcp Per Consultants: IR, ethics  Indication for Hospitalization: Altered Mental Status 2/2 Chronic Small Vessel Disease  Brief Hospital Course:  Jacob Bass is a 61 y.o. male who presented with AMS likely 2/2 progressed chronic small vessel disease. PMH is significant for CVA, cirrhosis, HCV, dementia, and SAH. A brief hospital course is below.   AMS likely 2/2 chronic small vessel disease progression Presented from SNF nonverbal and not eating. At baseline, he is minimally verbal and will eat when given food. VSS. Exam was remarkable for intermittent responses and LLE ankle clonus. CT head with advanced senescent change. MRI brain with progressed chronic small vessel disease into brainstem and gray nuclei but without acute abnormality. AMS improved 1 day after admission with fluids and abx (see below). At discharge, pt neuro status was improved to baseline.    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 Per chart review was referred to ID for hep C treatment but unclear if he ever received treatment. HCV RNA wnl. Mildly elevated ammonia on admission, but low suspicion that hepatic encephalopathy was contributing to his overall presentation. Patient had a distended abdomen and paracentesis was attempted by IR on 10/30; however, patient was unable to tolerate so it was not completed. He remained stable and was treated for SBP as below.   Fever  Presumed SBP Fever to 100.4 and leukocytosis to 12.6 on admission. UA with possible UTI, urine culture negative. Attempted paracentesis but pt unable to cooperate. Treated for presumed SBP with  Rocephin and ciprofloxacin for 5 day course. At discharge, pt was afebrile >48hrs and mental status improved. Discharged on Ciprofloxacin for SBP prophylaxis.   Bilateral lower extremity edema Per SNF staff this is a new finding. 2+ pitting edema to mid-shin bilaterally. Likely 2/2 hepatic dysfunction. Echo with EF 60-65%, mild RV enlargement, mild PAH. Left leg appeared slightly larger, but doppler was negative for DVT. Stable and nontender throughout admission.   AKI Cr elevated during admission, most likely due to dehydration in setting of poor PO intake. Pt was treated w/ IV fluids and encouraged PO as mental status improved. Will need Cr recheck at SNF.  Chronic and stable conditions: COPD: continued home spiriva, advair, prn albuterol. HTN: continued home lisinopril, metoprolol, and amlodipine.   Discharge Diagnoses/Problem List:  AMS, Cirrhosis w/ ascites, presumed SBP, COPD, HTN  Disposition: SNF  Discharge Condition: Medically stable   Discharge Exam:  General: Pleasant, joking man. NAD. Neuro: Alert (more than yesterday) & oriented to self (Jacob Bass). Not oriented to place or time. Cardiovascular: RRR Respiratory: Normal WOB on RA Abdomen: Normal BS  Issues for Follow Up:  1) Started on Ciprofloxacin for SBP prophylaxis 2) Recheck Cr and K to monitor improvement of AKI at follow up.  2) Consider starting spironolactone and lasix for chronic cirrhosis.   Significant Procedures: None  Significant Labs and Imaging:  Recent Labs  Lab 07/05/22 0522  WBC 6.8  HGB 11.7*  HCT 35.3*  PLT 96*   Recent Labs  Lab 07/05/22 0522 07/05/22 1131 07/06/22 0231 07/06/22 1125  NA 150* 150* 148* 146*  K 3.7 3.7 3.5 3.4*  CL 120* 117* 121* 121*  CO2 21*  21* 23 22  GLUCOSE 73 94 108* 113*  BUN 19 20 28* 28*  CREATININE 0.91 0.97 1.38* 1.30*  CALCIUM 8.5* 8.8* 8.1* 8.0*  ALKPHOS 56  --   --   --   AST 57*  --   --   --   ALT 35  --   --   --   ALBUMIN 2.6*  --   --   --     MRI brain 1. Motion degraded exam despite repeated imaging attempts. No acute intracranial abnormality identified. 2. Advanced chronic small vessel disease, progressed since 2020 and severe in the right deep gray nuclei and brainstem.   Echocardiogram 1. Left ventricular ejection fraction, by estimation, is 60 to 65%. The  left ventricle has normal function. The left ventricle has no regional  wall motion abnormalities. Left ventricular diastolic parameters were  normal.   2. Right ventricular systolic function is normal. The right ventricular  size is mildly enlarged. There is mildly elevated pulmonary artery  systolic pressure. The estimated right ventricular systolic pressure is  38.8 mmHg.   3. The mitral valve is normal in structure. Mild mitral valve  regurgitation. No evidence of mitral stenosis.   4. The aortic valve is tricuspid. Aortic valve regurgitation is trivial.  No aortic stenosis is present.   5. The inferior vena cava is normal in size with greater than 50%  respiratory variability, suggesting right atrial pressure of 3 mmHg.   6. Agitated saline contrast bubble study was negative, with no evidence  of any interatrial shunt.   Results/Tests Pending at Time of Discharge: None  Discharge Medications:  Allergies as of 07/06/2022       Reactions   Penicillins Hives        Medication List     STOP taking these medications    naproxen sodium 220 MG tablet Commonly known as: ALEVE       TAKE these medications    acetaminophen 500 MG tablet Commonly known as: TYLENOL Take 1,000 mg by mouth every 8 (eight) hours as needed for moderate pain (severe pain).   albuterol 108 (90 Base) MCG/ACT inhaler Commonly known as: VENTOLIN HFA Inhale 2 puffs into the lungs every 4 (four) hours as needed for wheezing or shortness of breath.   amLODipine 5 MG tablet Commonly known as: NORVASC Take 1 tablet (5 mg total) by mouth daily. What changed:  medication  strength how much to take   atorvastatin 40 MG tablet Commonly known as: LIPITOR Take 1 tablet (40 mg total) by mouth daily. What changed: when to take this   ciprofloxacin 500 MG tablet Commonly known as: Cipro Take 1 tablet (500 mg total) by mouth daily.   cyanocobalamin 1000 MCG tablet Take 1 tablet (1,000 mcg total) by mouth daily.   feeding supplement Liqd Take 237 mLs by mouth daily at 3 pm. What changed:  when to take this additional instructions   fluticasone-salmeterol 250-50 MCG/ACT Aepb Commonly known as: ADVAIR Inhale 1 puff into the lungs in the morning and at bedtime.   folic acid 1 MG tablet Commonly known as: FOLVITE Take 1 tablet (1 mg total) by mouth daily.   ipratropium-albuterol 0.5-2.5 (3) MG/3ML Soln Commonly known as: DUONEB Take 3 mLs by nebulization every 4 (four) hours as needed. What changed: reasons to take this   lactulose 10 GM/15ML solution Commonly known as: CHRONULAC Take 30 mLs (20 g total) by mouth 2 (two) times daily. What changed: when to take  this   lisinopril 5 MG tablet Commonly known as: ZESTRIL Take 1 tablet (5 mg total) by mouth daily.   magnesium oxide 400 (240 Mg) MG tablet Commonly known as: MAG-OX Take 400 mg by mouth 3 (three) times daily.   melatonin 5 MG Tabs Take 5 mg by mouth at bedtime.   metoprolol tartrate 25 MG tablet Commonly known as: LOPRESSOR Take 1 tablet (25 mg total) by mouth 2 (two) times daily.   pantoprazole 40 MG tablet Commonly known as: PROTONIX Take 1 tablet (40 mg total) by mouth 2 (two) times daily. What changed: when to take this   RA Calcium 600/Vit D/Minerals 600-200 MG-UNIT Tabs Take 1 tablet by mouth daily at 12 noon.   tamsulosin 0.4 MG Caps capsule Commonly known as: FLOMAX Take 1 capsule (0.4 mg total) by mouth daily after supper. What changed: when to take this   tiotropium 18 MCG inhalation capsule Commonly known as: SPIRIVA Place 18 mcg into inhaler and inhale  daily.        Discharge Instructions: Please refer to Patient Instructions section of EMR for full details.  Patient was counseled important signs and symptoms that should prompt return to medical care, changes in medications, dietary instructions, activity restrictions, and follow up appointments.   Follow-Up Appointments: Discharging to SNF   Arlyce Dice, MD 07/06/2022, 1:14 PM PGY-1, [place]   I was personally present and performed or re-performed the history, physical exam and medical decision making activities of this service and have verified that the service and findings are accurately documented in the intern's note. My edits are noted above within the note. Please also see attending's attestation.   Donney Dice, DO                  07/06/2022, 1:27 PM  PGY-3, [place]

## 2022-07-05 NOTE — Progress Notes (Addendum)
     Daily Progress Note Intern Pager: [phone]  Patient name: Jacob Bass Medical record number: [id number] Date of birth: 16-Jul-1961 Age: 61 y.o. Gender: male  Primary Care Provider: Patient, No Pcp Per Consultants: IR, ethics Code Status: Full  Pt Overview and Major Events to Date:  10/29 Admitted  Assessment and Plan: AH is a 49M admitted for AMS most likely due to small vessel disease, but also being treated for presumed SBP. His neuro status is now at baseline and pt is medically stable for discharge pending SNF placement.  Pertinent PMH/PSH includes Hep, cirrhosis, hx of alcohol use, HTN.   * Altered mental status More alert today, back at neuro baseline. - Cipro 500 BID today (Total 5 day antibx course for presumed SBP), then transition to cipro prophylactic dosing  - Lactulose PO - Delirium and fall precautions - PT/OT  Cirrhosis of liver without ascites (HCC) - Cont home lactulose. Had BM yesterday.  Fever-resolved as of 07/04/2022 Afebril >48hrs  Poor fluid intake At baseline will take food/fluids when offered.  - Assist with meals once able to take PO  - Please offer 297mL fluids q4h as tolerated  Bilateral lower extremity edema Trace edema and stable. - Negative for DVT  COPD (chronic obstructive pulmonary disease) (HCC) - Continue home inhaler regimen (Spiriva, Advair, and prn albuterol)  Hypertension On lisinopril, metoprolol, and amlodipine at home. BP moderately elevated. -Continue home meds   FEN/GI: Dysphagia diet PPx: Lovenox Dispo:SNF today. Barriers include SNF placement.   Subjective:  More alert today. He feels good and ready to go home.   Objective: Temp:  [97.7 F (36.5 C)-98.2 F (36.8 C)] 97.7 F (36.5 C) (11/02 0822) Pulse Rate:  [67-74] 67 (11/02 0822) Resp:  [16-18] 16 (11/02 0822) BP: (165-177)/(81-103) 169/81 (11/02 0822) SpO2:  [99 %-100 %] 100 % (11/02 8882) Physical Exam: General: Pleasant, joking man. NAD. Neuro:  Alert (more than yesterday) & oriented to full name (Jacob Bass), place ([facility]), and year (2023).  Cardiovascular: RRR Respiratory: Normal WOB on RA Abdomen: Normal BS  Laboratory: Most recent CBC Lab Results  Component Value Date   WBC 6.8 07/05/2022   HGB 11.7 (L) 07/05/2022   HCT 35.3 (L) 07/05/2022   MCV 100.3 (H) 07/05/2022   PLT 96 (L) 07/05/2022   Most recent BMP    Latest Ref Rng & Units 07/05/2022    5:22 AM  BMP  Glucose 70 - 99 mg/dL 73   BUN 8 - 23 mg/dL 19   Creatinine 0.61 - 1.24 mg/dL 0.91   Sodium 135 - 145 mmol/L 150   Potassium 3.5 - 5.1 mmol/L 3.7   Chloride 98 - 111 mmol/L 120   CO2 22 - 32 mmol/L 21   Calcium 8.9 - 10.3 mg/dL 8.5     Arlyce Dice, MD 07/05/2022, 8:59 AM  PGY-1, [place] Intern pager: [phone], text pages welcome Secure chat group [place]

## 2022-07-05 NOTE — Discharge Instructions (Addendum)
You were hospitalized at [facility] due to altered mental status.  We expect this is from your cirrhosis or liver scarring which improved after medications and monitoring.  We are so glad you are feeling better.  Be sure to follow-up with your regularly scheduled appointments.  Please also be sure to follow-up with your PCP at your earliest convenience. Please make sure to take ciprofloxacin 500 mg daily. Please take all medications as prescribed, your amlodipine was increased to 5 mg, please take this daily for your blood pressure. Thank you for allowing [place] to be a part of your medical care.  Take care, [facility] family medicine team

## 2022-07-06 ENCOUNTER — Other Ambulatory Visit (HOSPITAL_COMMUNITY): Payer: Self-pay

## 2022-07-06 DIAGNOSIS — E870 Hyperosmolality and hypernatremia: Secondary | ICD-10-CM | POA: Diagnosis not present

## 2022-07-06 DIAGNOSIS — R4182 Altered mental status, unspecified: Secondary | ICD-10-CM | POA: Diagnosis not present

## 2022-07-06 DIAGNOSIS — G9340 Encephalopathy, unspecified: Secondary | ICD-10-CM | POA: Diagnosis not present

## 2022-07-06 LAB — BASIC METABOLIC PANEL
Anion gap: 4 — ABNORMAL LOW (ref 5–15)
Anion gap: 3 — ABNORMAL LOW (ref 5–15)
BUN: 28 mg/dL — ABNORMAL HIGH (ref 8–23)
BUN: 28 mg/dL — ABNORMAL HIGH (ref 8–23)
CO2: 23 mmol/L (ref 22–32)
CO2: 22 mmol/L (ref 22–32)
Calcium: 8.1 mg/dL — ABNORMAL LOW (ref 8.9–10.3)
Calcium: 8 mg/dL — ABNORMAL LOW (ref 8.9–10.3)
Chloride: 121 mmol/L — ABNORMAL HIGH (ref 98–111)
Chloride: 121 mmol/L — ABNORMAL HIGH (ref 98–111)
Creatinine, Ser: 1.38 mg/dL — ABNORMAL HIGH (ref 0.61–1.24)
Creatinine, Ser: 1.3 mg/dL — ABNORMAL HIGH (ref 0.61–1.24)
GFR, Estimated: 57 mL/min — ABNORMAL LOW (ref >60)
GFR, Estimated: >60 mL/min (ref >60)
Glucose, Bld: 108 mg/dL — ABNORMAL HIGH (ref 70–99)
Glucose, Bld: 113 mg/dL — ABNORMAL HIGH (ref 70–99)
Potassium: 3.5 mmol/L (ref 3.5–5.1)
Potassium: 3.4 mmol/L — ABNORMAL LOW (ref 3.5–5.1)
Sodium: 148 mmol/L — ABNORMAL HIGH (ref 135–145)
Sodium: 146 mmol/L — ABNORMAL HIGH (ref 135–145)

## 2022-07-06 MED ORDER — ATORVASTATIN CALCIUM 40 MG PO TABS
40.0000 mg | ORAL_TABLET | Freq: Every day | ORAL | 0 refills | Status: DC
  Filled 2022-07-06: qty 30, 30d supply, fill #0

## 2022-07-06 MED ORDER — POTASSIUM CHLORIDE 20 MEQ PO PACK
20.0000 meq | PACK | Freq: Once | ORAL | Status: AC
  Administered 2022-07-06: 20 meq via ORAL
  Filled 2022-07-06: qty 1

## 2022-07-06 MED ORDER — LACTULOSE 10 GM/15ML PO SOLN
20.0000 g | Freq: Two times a day (BID) | ORAL | 0 refills | Status: DC
  Filled 2022-07-06: qty 236, 4d supply, fill #0

## 2022-07-06 MED ORDER — METOPROLOL TARTRATE 25 MG PO TABS
25.0000 mg | ORAL_TABLET | Freq: Two times a day (BID) | ORAL | 0 refills | Status: AC
  Filled 2022-07-06: qty 60, 30d supply, fill #0

## 2022-07-06 MED ORDER — CIPROFLOXACIN HCL 500 MG PO TABS
500.0000 mg | ORAL_TABLET | Freq: Every day | ORAL | 1 refills | Status: AC
  Filled 2022-07-06: qty 30, 30d supply, fill #0

## 2022-07-06 MED ORDER — SODIUM CHLORIDE 0.9 % IV BOLUS: 500.0000 mL | Freq: Once | INTRAVENOUS | Status: DC

## 2022-07-06 MED ORDER — AMLODIPINE BESYLATE 5 MG PO TABS
5.0000 mg | ORAL_TABLET | Freq: Every day | ORAL | 0 refills | Status: AC
  Filled 2022-07-06: qty 30, 30d supply, fill #0

## 2022-07-06 NOTE — Progress Notes (Signed)
DISCHARGE NOTE SNF Jacob Bass to be discharged Skilled nursing facility per MD order. Patient verbalized understanding.  Skin clean, dry and intact without evidence of skin break down, no evidence of skin tears noted. IV catheter discontinued intact. Site without signs and symptoms of complications. Dressing and pressure applied. Pt denies pain at the site currently. No complaints noted.  Patient free of lines, drains, and wounds.   Discharge packet assembled. An After Visit Summary (AVS) was printed and given to the EMS personnel. Patient escorted via stretcher and discharged to [facility] via ambulance. Report called to accepting facility; all questions and concerns addressed.   Arlyss Repress, RN

## 2022-07-06 NOTE — Progress Notes (Signed)
Call[facility] to give report. Was unable to reach staff to give report.

## 2022-07-06 NOTE — TOC Transition Note (Signed)
Transition of Care [facility]) - CM/SW Discharge Note   Patient Details  Name: Jacob Bass MRN: [id number] Date of Birth: 10-Mar-1961  [facility]) CM/SW Contact:  Milinda Antis, [place] Phone Number: 07/06/2022, 1:41 PM   Clinical Narrative:    [facility] Anticipated DC date:  07/06/2022 Transport by: Corey Harold   Per MD patient ready for DC to SNF. RN to call report prior to discharge ([phone] room 114A). RN and facility notified of DC. Discharge Summary and FL2 sent to facility. DC packet on chart. Ambulance transport requested for patient.   CSW will sign off for now as social work intervention is no longer needed. Please consult [place] again if new needs arise.    Final next level of care: Skilled Nursing Facility Barriers to Discharge: No Barriers Identified   Patient Goals and CMS Choice        Discharge Placement              Patient chooses bed at:  [facility]) Patient to be transferred to facility by: [place] Name of family member notified: facility staff Patient and family notified of of transfer: 07/06/22  Discharge Plan and Services                                     Social Determinants of Health (SDOH) Interventions     Readmission Risk Interventions     No data to display

## 2022-08-11 ENCOUNTER — Other Ambulatory Visit: Payer: Self-pay

## 2022-08-11 ENCOUNTER — Emergency Department (HOSPITAL_COMMUNITY)
Admission: EM | Admit: 2022-08-11 | Discharge: 2022-08-11 | Disposition: A | Discharge status: Skilled Nursing Facility | Payer: Medicaid Other | Attending: Emergency Medicine | Admitting: Emergency Medicine

## 2022-08-11 DIAGNOSIS — M7989 Other specified soft tissue disorders: Secondary | ICD-10-CM | POA: Diagnosis present

## 2022-08-11 DIAGNOSIS — R609 Edema, unspecified: Secondary | ICD-10-CM

## 2022-08-11 DIAGNOSIS — K7460 Unspecified cirrhosis of liver: Secondary | ICD-10-CM | POA: Diagnosis not present

## 2022-08-11 DIAGNOSIS — Z79899 Other long term (current) drug therapy: Secondary | ICD-10-CM | POA: Diagnosis not present

## 2022-08-11 DIAGNOSIS — F0390 Unspecified dementia without behavioral disturbance: Secondary | ICD-10-CM | POA: Insufficient documentation

## 2022-08-11 DIAGNOSIS — R188 Other ascites: Secondary | ICD-10-CM

## 2022-08-11 DIAGNOSIS — R600 Localized edema: Secondary | ICD-10-CM | POA: Diagnosis not present

## 2022-08-11 MED ORDER — FUROSEMIDE 10 MG/ML IJ SOLN
40.0000 mg | Freq: Once | INTRAMUSCULAR | Status: AC
  Administered 2022-08-11: 40 mg via INTRAVENOUS
  Filled 2022-08-11: qty 4

## 2022-08-11 MED ORDER — FUROSEMIDE 20 MG PO TABS: 40.0000 mg | ORAL_TABLET | Freq: Every day | ORAL | 0 refills | Status: DC

## 2022-08-11 NOTE — ED Triage Notes (Signed)
Pt BIB [facility] from [facility] after nursing staff reported critical ammonia levels. Nursing staff at facility was unable to find the actual results. Legs and abdomen more swollen than usual. A&Ox2 currently and at baseline. Hx of stroke- left arm contracted. Denies pain.

## 2022-08-11 NOTE — Discharge Instructions (Signed)
Mr. Sherk was sent to the emergency room for elevated ammonia test.  He is alert, answering all questions appropriately and has no signs of hepatic encephalopathy.  Ammonia test is clinically utilized to evaluate for hepatic encephalopathy.  In the setting of there is no encephalopathy, ammonia test is not clinically significant.  Ammonia test can be used to look at liver function, but patient already has liver cirrhosis -therefore there is no benefit of trending ammonia level and him.  If you start noticing increasing confusion, but you may increase the lactulose frequency.  Read the instructions provided on ammonia test and also hepatic encephalopathy.  In regards to swelling in the leg, we will start patient on Lasix for 3 to 5 days.  Unfortunately, because of his liver disease he will continue to have swelling in the leg, abdominal ascites and in some cases fluid around the lungs.

## 2022-08-11 NOTE — ED Provider Notes (Signed)
[facility]-EMERGENCY DEPT Provider Note   CSN: [id number] Arrival date & time: 08/11/22  1949     History  Chief Complaint  Patient presents with   Abnormal Labs    Jacob Bass is a 61 y.o. male.  HPI    61 year old male comes in with chief complaint of abnormal labs. Patient has history of liver cirrhosis, stroke, dementia.  He was admitted to the hospital on 11-3 for altered mental status, questionable SBP and peripheral edema.  Patient was sent to the emergency room for abnormal lab.  I called [facility] and spoke with the nurse who sent the patient to the ER.  He stated that patient had an ammonia test ordered which was elevated, and they were advised to send the patient to the emergency room and they reported the lab to the nurse practitioner.  He states that the patient is at baseline, in fact he is a lot better than he was when he had arrived to the [facility] home and for space.  Patient also has swelling in the legs, and there is suspicion for volume overload, but he has not seen any increase swelling over the last few days.  Patient has no complaints from his side.  He is alert and oriented to self and is aware that he is from [place].  He states that the leg swelling might have gotten little worse, but that he has chronic leg swelling.  He denies any abdominal pain.  Home Medications Prior to Admission medications   Medication Sig Start Date End Date Taking? Authorizing Provider  furosemide (LASIX) 20 MG tablet Take 2 tablets (40 mg total) by mouth daily. 08/12/22  Yes Derwood Kaplan, MD  acetaminophen (TYLENOL) 500 MG tablet Take 1,000 mg by mouth every 8 (eight) hours as needed for moderate pain (severe pain).    [provider]  albuterol (VENTOLIN HFA) 108 (90 Base) MCG/ACT inhaler Inhale 2 puffs into the lungs every 4 (four) hours as needed for wheezing or shortness of breath.    [provider]   amLODipine (NORVASC) 5 MG tablet Take 1 tablet (5 mg total) by mouth daily. 07/06/22 08/05/22  Lincoln Brigham, MD  atorvastatin (LIPITOR) 40 MG tablet Take 1 tablet (40 mg total) by mouth daily. 07/06/22 08/05/22  Lincoln Brigham, MD  Calcium Carbonate-Vit D-Min (RA CALCIUM 600/VIT D/MINERALS) 600-200 MG-UNIT TABS Take 1 tablet by mouth daily at 12 noon.    [provider]  ciprofloxacin (CIPRO) 500 MG tablet Take 1 tablet (500 mg total) by mouth daily. 07/06/22 09/04/22  Lincoln Brigham, MD  feeding supplement, ENSURE ENLIVE, (ENSURE ENLIVE) LIQD Take 237 mLs by mouth daily at 3 pm. Patient taking differently: Take 237 mLs by mouth 3 (three) times daily. Given at med pass 10/28/18   [name], MD  fluticasone-salmeterol (ADVAIR) 250-50 MCG/ACT AEPB Inhale 1 puff into the lungs in the morning and at bedtime.    [provider]  folic acid (FOLVITE) 1 MG tablet Take 1 tablet (1 mg total) by mouth daily. Patient not taking: Reported on 06/30/2022 10/28/18   [name], MD  ipratropium-albuterol (DUONEB) 0.5-2.5 (3) MG/3ML SOLN Take 3 mLs by nebulization every 4 (four) hours as needed. Patient taking differently: Take 3 mLs by nebulization every 4 (four) hours as needed (for shortness of breathand wheezing). 10/27/18   [name], MD  lactulose (CHRONULAC) 10 GM/15ML solution Take 30 mLs (20 g total) by mouth 2 (two) times daily.  07/06/22   Arlyce Dice, MD  lisinopril (PRINIVIL,ZESTRIL) 5 MG tablet Take 1 tablet (5 mg total) by mouth daily. 10/27/18 07/01/23  Florencia Reasons, MD  magnesium oxide (MAG-OX) 400 (240 Mg) MG tablet Take 400 mg by mouth 3 (three) times daily.    [provider]  melatonin 5 MG TABS Take 5 mg by mouth at bedtime.    [provider]  metoprolol tartrate (LOPRESSOR) 25 MG tablet Take 1 tablet (25 mg total) by mouth 2 (two) times daily. 07/06/22 08/05/22  Arlyce Dice, MD  pantoprazole (PROTONIX) 40 MG tablet Take 1 tablet (40 mg total) by mouth 2 (two) times daily. Patient  taking differently: Take 40 mg by mouth daily. 10/20/18   Charlynne Cousins, MD  tamsulosin (FLOMAX) 0.4 MG CAPS capsule Take 1 capsule (0.4 mg total) by mouth daily after supper. Patient taking differently: Take 0.4 mg by mouth every evening. 10/27/18   Florencia Reasons, MD  tiotropium (SPIRIVA) 18 MCG inhalation capsule Place 18 mcg into inhaler and inhale daily.    [provider]  vitamin B-12 1000 MCG tablet Take 1 tablet (1,000 mcg total) by mouth daily. 10/28/18   Florencia Reasons, MD      Allergies    Penicillins    Review of Systems   Review of Systems  All other systems reviewed and are negative.   Physical Exam Updated Vital Signs BP 131/67 (BP Location: Right Arm)   Pulse 77   Temp 98.9 F (37.2 C) (Oral)   Resp 16   SpO2 100%  Physical Exam Vitals and nursing note reviewed.  Constitutional:      Appearance: He is well-developed.  HENT:     Head: Atraumatic.  Cardiovascular:     Rate and Rhythm: Normal rate.  Pulmonary:     Effort: Pulmonary effort is normal.  Abdominal:     General: There is distension.     Tenderness: There is no abdominal tenderness.  Musculoskeletal:     Cervical back: Neck supple.  Skin:    General: Skin is warm.  Neurological:     Mental Status: He is alert. Mental status is at baseline.     Comments: No asterixis     ED Results / Procedures / Treatments   Labs (all labs ordered are listed, but only abnormal results are displayed) Labs Reviewed - No data to display  EKG None  Radiology No results found.  Procedures Procedures    Medications Ordered in ED Medications  furosemide (LASIX) injection 40 mg (40 mg Intravenous Given 08/11/22 2139)    ED Course/ Medical Decision Making/ A&P                           Medical Decision Making 62 year old patient with history of end-stage liver disease/cirrhosis with complications of ascites, pitting edema comes into the emergency room with chief complaint of abnormal ammonia  level.  Patient is alert, there is no disorientation, confusion, lethargy and patient is answering questions appropriately.  He has dementia, and baseline disorientation.  He has pitting edema, but it is not new.  I have reviewed patient's recent discharge summary from 11-3. It appears that pitting edema is not new.  It appears that at that time, ammonia level was high, but there was no concerns for hepatic encephalopathy.  Patient is already on lactulose.  The nursing staff they are unable to tell me the exact ammonia level, but it is inconsequential.  Patient is not having hepatic encephalopathy.  His ammonia level does not merit admission, at its worst it is indicated that he has liver disease which we already know about.  I asked the nursing home staff if patient has any recent illnesses, new oxygen requirement etc., they stated that there was no changes in fact, patient looks better than he did when he had arrived to the nursing home few days back.  Labs not indicated.  Patient will be discharged.   Risk Prescription drug management.    Final Clinical Impression(s) / ED Diagnoses Final diagnoses:  Peripheral edema  Cirrhosis of liver with ascites, unspecified hepatic cirrhosis type [facility])    Rx / DC Orders ED Discharge Orders          Ordered    furosemide (LASIX) 20 MG tablet  Daily        08/11/22 2124              Varney Biles, MD 08/11/22 2208

## 2022-08-11 NOTE — ED Notes (Signed)
Called report to [facility]. Will call [facility] for tx back to facility

## 2022-09-10 ENCOUNTER — Emergency Department (HOSPITAL_COMMUNITY): Payer: Medicaid Other

## 2022-09-10 ENCOUNTER — Inpatient Hospital Stay (HOSPITAL_COMMUNITY)
Admission: EM | Admit: 2022-09-10 | Discharge: 2022-10-26 | DRG: 064 | Disposition: A | Discharge status: Skilled Nursing Facility | Payer: Medicaid Other | Source: Skilled Nursing Facility | Attending: Internal Medicine | Admitting: Internal Medicine

## 2022-09-10 ENCOUNTER — Encounter (HOSPITAL_COMMUNITY): Payer: Self-pay

## 2022-09-10 ENCOUNTER — Other Ambulatory Visit: Payer: Self-pay

## 2022-09-10 ENCOUNTER — Observation Stay (HOSPITAL_COMMUNITY): Payer: Medicaid Other

## 2022-09-10 DIAGNOSIS — Z1152 Encounter for screening for COVID-19: Secondary | ICD-10-CM

## 2022-09-10 DIAGNOSIS — L89626 Pressure-induced deep tissue damage of left heel: Secondary | ICD-10-CM | POA: Diagnosis not present

## 2022-09-10 DIAGNOSIS — Z8711 Personal history of peptic ulcer disease: Secondary | ICD-10-CM

## 2022-09-10 DIAGNOSIS — K7682 Hepatic encephalopathy: Secondary | ICD-10-CM | POA: Diagnosis present

## 2022-09-10 DIAGNOSIS — R195 Other fecal abnormalities: Secondary | ICD-10-CM

## 2022-09-10 DIAGNOSIS — D539 Nutritional anemia, unspecified: Secondary | ICD-10-CM | POA: Diagnosis present

## 2022-09-10 DIAGNOSIS — K279 Peptic ulcer, site unspecified, unspecified as acute or chronic, without hemorrhage or perforation: Secondary | ICD-10-CM | POA: Diagnosis not present

## 2022-09-10 DIAGNOSIS — D649 Anemia, unspecified: Secondary | ICD-10-CM | POA: Insufficient documentation

## 2022-09-10 DIAGNOSIS — F0390 Unspecified dementia without behavioral disturbance: Secondary | ICD-10-CM | POA: Diagnosis present

## 2022-09-10 DIAGNOSIS — E8809 Other disorders of plasma-protein metabolism, not elsewhere classified: Secondary | ICD-10-CM | POA: Diagnosis present

## 2022-09-10 DIAGNOSIS — Z6820 Body mass index (BMI) 20.0-20.9, adult: Secondary | ICD-10-CM

## 2022-09-10 DIAGNOSIS — K7031 Alcoholic cirrhosis of liver with ascites: Secondary | ICD-10-CM | POA: Diagnosis present

## 2022-09-10 DIAGNOSIS — R29810 Facial weakness: Secondary | ICD-10-CM | POA: Diagnosis present

## 2022-09-10 DIAGNOSIS — R471 Dysarthria and anarthria: Secondary | ICD-10-CM | POA: Diagnosis present

## 2022-09-10 DIAGNOSIS — J449 Chronic obstructive pulmonary disease, unspecified: Secondary | ICD-10-CM | POA: Diagnosis present

## 2022-09-10 DIAGNOSIS — R197 Diarrhea, unspecified: Secondary | ICD-10-CM | POA: Diagnosis not present

## 2022-09-10 DIAGNOSIS — E440 Moderate protein-calorie malnutrition: Secondary | ICD-10-CM | POA: Insufficient documentation

## 2022-09-10 DIAGNOSIS — D6959 Other secondary thrombocytopenia: Secondary | ICD-10-CM | POA: Diagnosis present

## 2022-09-10 DIAGNOSIS — E861 Hypovolemia: Secondary | ICD-10-CM | POA: Diagnosis not present

## 2022-09-10 DIAGNOSIS — J189 Pneumonia, unspecified organism: Secondary | ICD-10-CM

## 2022-09-10 DIAGNOSIS — R946 Abnormal results of thyroid function studies: Secondary | ICD-10-CM | POA: Diagnosis present

## 2022-09-10 DIAGNOSIS — E872 Acidosis, unspecified: Secondary | ICD-10-CM | POA: Diagnosis present

## 2022-09-10 DIAGNOSIS — E870 Hyperosmolality and hypernatremia: Secondary | ICD-10-CM | POA: Diagnosis present

## 2022-09-10 DIAGNOSIS — K920 Hematemesis: Secondary | ICD-10-CM | POA: Diagnosis not present

## 2022-09-10 DIAGNOSIS — I63512 Cerebral infarction due to unspecified occlusion or stenosis of left middle cerebral artery: Principal | ICD-10-CM | POA: Diagnosis present

## 2022-09-10 DIAGNOSIS — R601 Generalized edema: Secondary | ICD-10-CM | POA: Diagnosis present

## 2022-09-10 DIAGNOSIS — Z7951 Long term (current) use of inhaled steroids: Secondary | ICD-10-CM

## 2022-09-10 DIAGNOSIS — Z79899 Other long term (current) drug therapy: Secondary | ICD-10-CM

## 2022-09-10 DIAGNOSIS — R040 Epistaxis: Secondary | ICD-10-CM | POA: Diagnosis not present

## 2022-09-10 DIAGNOSIS — Z8673 Personal history of transient ischemic attack (TIA), and cerebral infarction without residual deficits: Secondary | ICD-10-CM

## 2022-09-10 DIAGNOSIS — E871 Hypo-osmolality and hyponatremia: Secondary | ICD-10-CM | POA: Diagnosis not present

## 2022-09-10 DIAGNOSIS — R1312 Dysphagia, oropharyngeal phase: Secondary | ICD-10-CM | POA: Diagnosis present

## 2022-09-10 DIAGNOSIS — Z8619 Personal history of other infectious and parasitic diseases: Secondary | ICD-10-CM

## 2022-09-10 DIAGNOSIS — Z880 Allergy status to penicillin: Secondary | ICD-10-CM

## 2022-09-10 DIAGNOSIS — G8194 Hemiplegia, unspecified affecting left nondominant side: Secondary | ICD-10-CM | POA: Diagnosis present

## 2022-09-10 DIAGNOSIS — I69320 Aphasia following cerebral infarction: Secondary | ICD-10-CM

## 2022-09-10 DIAGNOSIS — K921 Melena: Secondary | ICD-10-CM | POA: Diagnosis not present

## 2022-09-10 DIAGNOSIS — R600 Localized edema: Secondary | ICD-10-CM | POA: Diagnosis present

## 2022-09-10 DIAGNOSIS — Z8719 Personal history of other diseases of the digestive system: Secondary | ICD-10-CM

## 2022-09-10 DIAGNOSIS — I639 Cerebral infarction, unspecified: Secondary | ICD-10-CM | POA: Diagnosis not present

## 2022-09-10 DIAGNOSIS — N179 Acute kidney failure, unspecified: Secondary | ICD-10-CM

## 2022-09-10 DIAGNOSIS — E876 Hypokalemia: Secondary | ICD-10-CM | POA: Diagnosis present

## 2022-09-10 DIAGNOSIS — D689 Coagulation defect, unspecified: Secondary | ICD-10-CM | POA: Diagnosis present

## 2022-09-10 DIAGNOSIS — K7460 Unspecified cirrhosis of liver: Secondary | ICD-10-CM | POA: Diagnosis not present

## 2022-09-10 DIAGNOSIS — R7401 Elevation of levels of liver transaminase levels: Secondary | ICD-10-CM | POA: Diagnosis present

## 2022-09-10 DIAGNOSIS — I69391 Dysphagia following cerebral infarction: Secondary | ICD-10-CM

## 2022-09-10 DIAGNOSIS — J90 Pleural effusion, not elsewhere classified: Secondary | ICD-10-CM

## 2022-09-10 DIAGNOSIS — F1010 Alcohol abuse, uncomplicated: Secondary | ICD-10-CM | POA: Diagnosis present

## 2022-09-10 DIAGNOSIS — E162 Hypoglycemia, unspecified: Secondary | ICD-10-CM | POA: Diagnosis present

## 2022-09-10 DIAGNOSIS — R4701 Aphasia: Secondary | ICD-10-CM

## 2022-09-10 DIAGNOSIS — N400 Enlarged prostate without lower urinary tract symptoms: Secondary | ICD-10-CM | POA: Diagnosis present

## 2022-09-10 DIAGNOSIS — Z8679 Personal history of other diseases of the circulatory system: Secondary | ICD-10-CM

## 2022-09-10 DIAGNOSIS — I10 Essential (primary) hypertension: Secondary | ICD-10-CM | POA: Diagnosis present

## 2022-09-10 DIAGNOSIS — J690 Pneumonitis due to inhalation of food and vomit: Secondary | ICD-10-CM | POA: Diagnosis present

## 2022-09-10 HISTORY — DX: Cerebral infarction, unspecified: I63.9

## 2022-09-10 HISTORY — DX: Alcohol abuse, uncomplicated: F10.10

## 2022-09-10 HISTORY — DX: Chronic obstructive pulmonary disease, unspecified: J44.9

## 2022-09-10 HISTORY — DX: Unspecified cirrhosis of liver: K74.60

## 2022-09-10 HISTORY — DX: Gastric ulcer, unspecified as acute or chronic, without hemorrhage or perforation: K25.9

## 2022-09-10 LAB — CBC WITH DIFFERENTIAL/PLATELET
Abs Immature Granulocytes: 0.01 10*3/uL (ref 0.00–0.07)
Basophils Absolute: 0 10*3/uL (ref 0.0–0.1)
Basophils Relative: 1 %
Eosinophils Absolute: 0.1 10*3/uL (ref 0.0–0.5)
Eosinophils Relative: 2 %
HCT: 25.8 % — ABNORMAL LOW (ref 39.0–52.0)
Hemoglobin: 8.8 g/dL — ABNORMAL LOW (ref 13.0–17.0)
Immature Granulocytes: 0 %
Lymphocytes Relative: 27 %
Lymphs Abs: 1.1 10*3/uL (ref 0.7–4.0)
MCH: 34.4 pg — ABNORMAL HIGH (ref 26.0–34.0)
MCHC: 34.1 g/dL (ref 30.0–36.0)
MCV: 100.8 fL — ABNORMAL HIGH (ref 80.0–100.0)
Monocytes Absolute: 0.6 10*3/uL (ref 0.1–1.0)
Monocytes Relative: 16 %
Neutro Abs: 2.2 10*3/uL (ref 1.7–7.7)
Neutrophils Relative %: 54 %
Platelets: 85 10*3/uL — ABNORMAL LOW (ref 150–400)
RBC: 2.56 MIL/uL — ABNORMAL LOW (ref 4.22–5.81)
RDW: 17.3 % — ABNORMAL HIGH (ref 11.5–15.5)
WBC: 4.1 10*3/uL (ref 4.0–10.5)
nRBC: 0 % (ref 0.0–0.2)

## 2022-09-10 LAB — COMPREHENSIVE METABOLIC PANEL
ALT: 64 U/L — ABNORMAL HIGH (ref 0–44)
AST: 113 U/L — ABNORMAL HIGH (ref 15–41)
Albumin: 1.7 g/dL — ABNORMAL LOW (ref 3.5–5.0)
Alkaline Phosphatase: 67 U/L (ref 38–126)
Anion gap: 8 (ref 5–15)
BUN: 23 mg/dL (ref 8–23)
CO2: 21 mmol/L — ABNORMAL LOW (ref 22–32)
Calcium: 7.8 mg/dL — ABNORMAL LOW (ref 8.9–10.3)
Chloride: 110 mmol/L (ref 98–111)
Creatinine, Ser: 2.09 mg/dL — ABNORMAL HIGH (ref 0.61–1.24)
GFR, Estimated: 35 mL/min — ABNORMAL LOW (ref >60)
Glucose, Bld: 95 mg/dL (ref 70–99)
Potassium: 4 mmol/L (ref 3.5–5.1)
Sodium: 139 mmol/L (ref 135–145)
Total Bilirubin: 1.8 mg/dL — ABNORMAL HIGH (ref 0.3–1.2)
Total Protein: 6.7 g/dL (ref 6.5–8.1)

## 2022-09-10 LAB — URINALYSIS, ROUTINE W REFLEX MICROSCOPIC
Bilirubin Urine: NEGATIVE
Glucose, UA: NEGATIVE mg/dL
Ketones, ur: NEGATIVE mg/dL
Leukocytes,Ua: NEGATIVE
Nitrite: NEGATIVE
Protein, ur: NEGATIVE mg/dL
Specific Gravity, Urine: 1.026 (ref 1.005–1.030)
pH: 5 (ref 5.0–8.0)

## 2022-09-10 LAB — RESPIRATORY PANEL BY PCR

## 2022-09-10 LAB — CBG MONITORING, ED: Glucose-Capillary: 75 mg/dL (ref 70–99)

## 2022-09-10 LAB — PROTIME-INR
INR: 1.5 — ABNORMAL HIGH (ref 0.8–1.2)
Prothrombin Time: 17.5 seconds — ABNORMAL HIGH (ref 11.4–15.2)

## 2022-09-10 LAB — VITAMIN B12: Vitamin B-12: 2050 pg/mL — ABNORMAL HIGH (ref 180–914)

## 2022-09-10 LAB — HIV ANTIBODY (ROUTINE TESTING W REFLEX): HIV Screen 4th Generation wRfx: NONREACTIVE

## 2022-09-10 LAB — ETHANOL: Alcohol, Ethyl (B): <10 mg/dL (ref <10)

## 2022-09-10 LAB — RESP PANEL BY RT-PCR (RSV, FLU A&B, COVID)  RVPGX2
Influenza A by PCR: NEGATIVE
Influenza B by PCR: NEGATIVE
Resp Syncytial Virus by PCR: NEGATIVE
SARS Coronavirus 2 by RT PCR: NEGATIVE

## 2022-09-10 LAB — TSH: TSH: 10.984 u[IU]/mL — ABNORMAL HIGH (ref 0.350–4.500)

## 2022-09-10 LAB — AMMONIA: Ammonia: 99 umol/L — ABNORMAL HIGH (ref 9–35)

## 2022-09-10 LAB — LIPASE, BLOOD: Lipase: 58 U/L — ABNORMAL HIGH (ref 11–51)

## 2022-09-10 LAB — BRAIN NATRIURETIC PEPTIDE: B Natriuretic Peptide: 165.2 pg/mL — ABNORMAL HIGH (ref 0.0–100.0)

## 2022-09-10 LAB — APTT: aPTT: 35 seconds (ref 24–36)

## 2022-09-10 LAB — LACTIC ACID, PLASMA: Lactic Acid, Venous: 3.5 mmol/L (ref 0.5–1.9)

## 2022-09-10 LAB — TROPONIN I (HIGH SENSITIVITY): Troponin I (High Sensitivity): 10 ng/L (ref <18)

## 2022-09-10 LAB — FOLATE: Folate: >40 ng/mL (ref >5.9)

## 2022-09-10 LAB — PROCALCITONIN: Procalcitonin: 0.1 ng/mL

## 2022-09-10 MED ORDER — IPRATROPIUM-ALBUTEROL 0.5-2.5 (3) MG/3ML IN SOLN
3.0000 mL | RESPIRATORY_TRACT | Status: DC
  Administered 2022-09-10 – 2022-09-12 (×10): 3 mL via RESPIRATORY_TRACT
  Filled 2022-09-10 (×9): qty 3

## 2022-09-10 MED ORDER — MOMETASONE FURO-FORMOTEROL FUM 200-5 MCG/ACT IN AERO
2.0000 | INHALATION_SPRAY | Freq: Two times a day (BID) | RESPIRATORY_TRACT | Status: DC
  Administered 2022-09-13 – 2022-09-26 (×26): 2 via RESPIRATORY_TRACT
  Filled 2022-09-10 (×2): qty 8.8

## 2022-09-10 MED ORDER — CLOPIDOGREL BISULFATE 75 MG PO TABS: 75.0000 mg | ORAL_TABLET | Freq: Every day | ORAL | Status: DC

## 2022-09-10 MED ORDER — PANTOPRAZOLE SODIUM 40 MG PO TBEC: 40.0000 mg | DELAYED_RELEASE_TABLET | Freq: Two times a day (BID) | ORAL | Status: DC

## 2022-09-10 MED ORDER — ASPIRIN 81 MG PO TBEC: 81.0000 mg | DELAYED_RELEASE_TABLET | Freq: Every day | ORAL | Status: DC

## 2022-09-10 MED ORDER — SODIUM CHLORIDE 0.9 % IV SOLN: INTRAVENOUS | Status: DC

## 2022-09-10 MED ORDER — ATORVASTATIN CALCIUM 40 MG PO TABS: 40.0000 mg | ORAL_TABLET | Freq: Every day | ORAL | Status: DC

## 2022-09-10 MED ORDER — TIOTROPIUM BROMIDE MONOHYDRATE 18 MCG IN CAPS: 18.0000 ug | ORAL_CAPSULE | Freq: Every day | RESPIRATORY_TRACT | Status: DC

## 2022-09-10 MED ORDER — SODIUM CHLORIDE 0.9 % IV SOLN
1.0000 g | Freq: Once | INTRAVENOUS | Status: AC
  Administered 2022-09-10: 1 g via INTRAVENOUS
  Filled 2022-09-10: qty 10

## 2022-09-10 MED ORDER — STROKE: EARLY STAGES OF RECOVERY BOOK: Freq: Once | Status: AC

## 2022-09-10 MED ORDER — ACETAMINOPHEN 650 MG RE SUPP: 650.0000 mg | RECTAL | Status: DC | PRN

## 2022-09-10 MED ORDER — ALBUMIN HUMAN 25 % IV SOLN
12.5000 g | Freq: Once | INTRAVENOUS | Status: AC
  Administered 2022-09-10: 12.5 g via INTRAVENOUS
  Filled 2022-09-10: qty 50

## 2022-09-10 MED ORDER — IPRATROPIUM-ALBUTEROL 0.5-2.5 (3) MG/3ML IN SOLN: 3.0000 mL | RESPIRATORY_TRACT | Status: DC | PRN

## 2022-09-10 MED ORDER — ACETAMINOPHEN 160 MG/5ML PO SOLN
650.0000 mg | ORAL | Status: DC | PRN
  Administered 2022-09-19 – 2022-10-07 (×5): 650 mg
  Filled 2022-09-10 (×5): qty 20.3

## 2022-09-10 MED ORDER — TAMSULOSIN HCL 0.4 MG PO CAPS
0.4000 mg | ORAL_CAPSULE | Freq: Every day | ORAL | Status: DC
  Administered 2022-09-20 – 2022-10-26 (×26): 0.4 mg via ORAL
  Filled 2022-09-10 (×30): qty 1

## 2022-09-10 MED ORDER — SODIUM CHLORIDE 0.9 % IV SOLN
500.0000 mg | Freq: Once | INTRAVENOUS | Status: AC
  Administered 2022-09-10: 500 mg via INTRAVENOUS
  Filled 2022-09-10: qty 5

## 2022-09-10 MED ORDER — ACETAMINOPHEN 325 MG PO TABS: 650.0000 mg | ORAL_TABLET | ORAL | Status: DC | PRN

## 2022-09-10 MED ORDER — SENNOSIDES-DOCUSATE SODIUM 8.6-50 MG PO TABS: 1.0000 | ORAL_TABLET | Freq: Every evening | ORAL | Status: DC | PRN

## 2022-09-10 MED ORDER — CLOPIDOGREL BISULFATE 300 MG PO TABS: 300.0000 mg | ORAL_TABLET | Freq: Once | ORAL | Status: DC

## 2022-09-10 MED ORDER — IOHEXOL 350 MG/ML SOLN
75.0000 mL | Freq: Once | INTRAVENOUS | Status: AC | PRN
  Administered 2022-09-10: 75 mL via INTRAVENOUS

## 2022-09-10 MED ORDER — MOMETASONE FURO-FORMOTEROL FUM 200-5 MCG/ACT IN AERO: 2.0000 | INHALATION_SPRAY | Freq: Two times a day (BID) | RESPIRATORY_TRACT | Status: DC

## 2022-09-10 MED ORDER — LACTULOSE 10 GM/15ML PO SOLN: 30.0000 g | Freq: Three times a day (TID) | ORAL | Status: DC

## 2022-09-10 MED ORDER — LACTULOSE ENEMA
300.0000 mL | Freq: Once | ORAL | Status: AC
  Administered 2022-09-10: 300 mL via RECTAL
  Filled 2022-09-10: qty 300

## 2022-09-10 MED ORDER — UMECLIDINIUM BROMIDE 62.5 MCG/ACT IN AEPB
1.0000 | INHALATION_SPRAY | Freq: Every day | RESPIRATORY_TRACT | Status: DC
  Administered 2022-09-15 – 2022-09-26 (×12): 1 via RESPIRATORY_TRACT
  Filled 2022-09-10 (×3): qty 7

## 2022-09-10 MED ORDER — ASPIRIN 300 MG RE SUPP
300.0000 mg | Freq: Once | RECTAL | Status: AC
  Administered 2022-09-10: 300 mg via RECTAL
  Filled 2022-09-10: qty 1

## 2022-09-10 NOTE — ED Triage Notes (Signed)
Pt to er room number 35 via ems, per ems pt is from [facility] and is here for some confusion, staff at facility doesn't know last known well, states that pt also has some wheezing, states that he also has a hx of covid.  Pt is awake and oriented to self.

## 2022-09-10 NOTE — ED Notes (Signed)
Pt in MRI.

## 2022-09-10 NOTE — ED Notes (Signed)
Pt is drooling out of L mouth, pt has wet cough, failed swallow screen, did not attempt having pt drink.  order placed for SLP consult.

## 2022-09-10 NOTE — Consult Note (Signed)
NEUROLOGY CONSULTATION NOTE   Date of service: September 10, 2022 Patient Name: Jacob Bass MRN:  [id number] DOB:  [birth year] Reason for consult: "aphasia" Requesting Provider: Tegeler, Canary Brim, * _ _ _   _ __   _ __ _ _  __ __   _ __   __ _  History of Present Illness  Jacob Bass is a 62 y.o. male With a past medical history significant for stroke, COPD, cirrhosis, and gastric ulcer reportedly presents via EMS for altered mental status.  According to EMS report to nursing, there was question if patient had choked on something and was having some difficulty breathing however when asked the patient he seemed to say no when asked about those problems.  His vital signs on arrival showed no tachycardia, tachypnea, fever, or hypoxia.  Blood pressure was in the 120s systolic.  On assessment by ED physician, patient has worsened left-sided weakness and speech difficulty--Code Stroke was activated as he is van positive.   LKW: 2251 1/7 TNK: No, outside of window NIH: 13   Past History   Past Medical History:  Diagnosis Date   Cirrhosis (HCC)    COPD (chronic obstructive pulmonary disease) (HCC)    Gastric ulcer    Stroke (HCC)    History reviewed. No pertinent surgical history. History reviewed. No pertinent family history. Social History   Socioeconomic History   Marital status: Single    Spouse name: Not on file   Number of children: Not on file   Years of education: Not on file   Highest education level: Not on file  Occupational History   Not on file  Tobacco Use   Smoking status: Not on file   Smokeless tobacco: Not on file  Substance and Sexual Activity   Alcohol use: Not on file   Drug use: Not on file   Sexual activity: Not on file  Other Topics Concern   Not on file  Social History Narrative   Not on file   Social Determinants of Health   Financial Resource Strain: Not on file  Food Insecurity: Not on file  Transportation Needs: Not on file  Physical  Activity: Not on file  Stress: Not on file  Social Connections: Not on file   Allergies  Allergen Reactions   Penicillins     Medications  (Not in a hospital admission)    Vitals   Vitals:   09/10/22 0947 09/10/22 0948  BP: 133/80   Pulse: 90   Resp: 18   Temp: 98.5 F (36.9 C)   TempSrc: Oral   SpO2: 100%   Weight:  74.8 kg  Height:  5\' 7"  (1.702 m)     Body mass index is 25.84 kg/m.  Physical Exam   General: Laying comfortably in bed; in no acute distress.  HENT: Normal oropharynx and mucosa. Normal external appearance of ears and nose.  Neck: Supple, no pain or tenderness  CV: No JVD. Diffuse edema.leg swelling.  Pulmonary: Symmetric Chest rise. Normal respiratory effort. Audible wheezing. Satting high 90s on RA.  Abdomen: Soft to touch, non-tender.  Ext: No cyanosis,or deformity. Pitting edema BLE, pedal up to thigh.  Skin: No rash. Normal palpation of skin.   Musculoskeletal: Normal digits and nails by inspection. Swollen left elbow, painful to touch.   Neurologic Examination  Mental status/Cognition:  Able to say name. Disoriented to time and age.  Moderate aphasia present.  Able to follow commands.  No neglect. Able to name half of  objects in stroke assessment photo.  Speech/language:  Moderate to severe dysarthria  Cranial nerves:  PERRL, EOMI, VF full Minor left side facial droop.  Haering intact to voice. Midline tongue protrusion.  Motor:  LUE: increased tone, contracted, slight drift present RUE: 5/5, no drift.  BLE: withdraw to pain, no effort against gravity.  No ataxia or termor present.   Sensation: Intact to light touch throughout  Coordination/Complex Motor:  - Finger to Nose: able to perform with right arm only - Gait: deferred  NIHSS components Score: Comment  1a Level of Conscious 0[x]  1[]  2[]  3[]      1b LOC Questions 0[]  1[]  2[x]       1c LOC Commands 0[x]  1[]  2[]       2 Best Gaze 0[x]  1[]  2[]       3 Visual 0[x]  1[]   2[]  3[]      4 Facial Palsy 0[]  1[x]  2[]  3[]      5a Motor Arm - left 0[]  1[x]  2[]  3[]  4[]  UN[]    5b Motor Arm - Right 0[x]  1[]  2[]  3[]  4[]  UN[]    6a Motor Leg - Left 0[]  1[]  2[]  3[x]  4[]  UN[]    6b Motor Leg - Right 0[]  1[]  2[]  3[x]  4[]  UN[]    7 Limb Ataxia 0[x]  1[]  2[]  3[]  UN[]     8 Sensory 0[x]  1[]  2[]  UN[]      9 Best Language 0[]  1[x]  2[]  3[]      10 Dysarthria 0[]  1[]  2[x]  UN[]      11 Extinct. and Inattention 0[x]  1[]  2[]       TOTAL: 13     Labs   CBC: No results for input(s): "WBC", "NEUTROABS", "HGB", "HCT", "MCV", "PLT" in the last 168 hours.  Basic Metabolic Panel: No results found for: "NA", "K", "CO2", "GLUCOSE", "BUN", "CREATININE", "CALCIUM", "GFRNONAA", "GFRAA" Lipid Panel: No results found for: "LDLCALC" HgbA1c: No results found for: "HGBA1C" Urine Drug Screen: No results found for: "LABOPIA", "COCAINSCRNUR", "LABBENZ", "AMPHETMU", "THCU", "LABBARB"  Alcohol Level No results found for: "ETH"  CT Head without contrast(Personally reviewed): Negative for acute abnormality. 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 Moderate focal stenosis of the P1 segment of the right PCA and moderate stenosis in the distal P2 segment of the left PCA; unchanged compared to 2020. 2 mm outpouching along the distal petrous segment of the left ICA, which could represent a small aneurysm. New moderate left-sided pleural effusion and ground-glass opacity along the anterior aspect of the left upper lobe measuring up to 2.0 x 1.2 cm. Could be infectious or inflammatory.  MRI Brain(Personally reviewed): pending   Impression   Jacob Bass is a 62 y.o. male with PMH significant for stroke, COPD, cirrhosis,  and gastric ulcer reportedly presents via EMS for altered mental status . His neurologic examination is notable for left arm weakness, aphasia and dysarthria.  Pending MRI for diagnosis and etiology.    Recommendations  - MRI brain. If positive, will complete stroke work-up.  - infectious work-up to rule out infection.  - monitor for electrolyte imbalances. ______________________________________________________________________   Thank you for the opportunity to take part in the care of this patient. If you have any further questions, please contact the neurology consultation attending.  Signed,  Pt seen by Neuro NP/APP and later by  MD. Note/plan to be edited by MD as needed.    Lynnae January, DNP, AGACNP-BC [facility] Please use AMION for pager and EPIC for messaging _ _ _   _ __   _ __ _ _  __ __   _ __   __ _  I have seen the patient and reviewed the above note.  62 year old male with a history of previous strokes with severe dysarthria who presents with worsening dysarthria.  His left-sided weakness appears to be baseline, but he does have significantly more trouble speaking than he typically does.  MRI reveals a small juxtacortical infarct and he is being admitted for further evaluation, and secondary risk factor modification.   - HgbA1c, fasting lipid panel - MRI of the brain without contrast - Frequent neuro checks - Echocardiogram - CTA head and neck - Prophylactic therapy-Antiplatelet med: Aspirin - dose 81mg  and plavix 75mg  daily  after 300mg  load  - Risk factor modification - Telemetry monitoring - PT consult, OT consult, Speech consult - Stroke team to follow   Ritta Slot, MD [facility] [phone]  If 7pm- 7am, please page neurology on call as listed in AMION.

## 2022-09-10 NOTE — ED Notes (Signed)
ED TO INPATIENT HANDOFF REPORT  ED Nurse Name and Phone #: [phone]   S Name/Age/Gender Jacob Bass 62 y.o. male Room/Bed: 039C/039C  Code Status   Code Status: Full Code  Home/SNF/Other Skilled nursing facility Patient oriented to: self, place Is this baseline? Yes   Triage Complete: Triage complete  Chief Complaint Acute CVA (cerebrovascular accident) [facility](HCC) [I63.9]  Triage Note Pt to er room number 35 via ems, per ems pt is from [facility] and is here for some confusion, staff at facility doesn't know last known well, states that pt also has some wheezing, states that he also has a hx of covid.  Pt is awake and oriented to self.     Allergies Allergies  Allergen Reactions   Penicillins Other (See Comments)    Unknown reaction Listed on MAR    Level of Care/Admitting Diagnosis ED Disposition     ED Disposition  Admit   Condition  --   Comment  [facility] [[id number]]  Level of Care: Telemetry Medical [104]  May place patient in observation at [facility] or [facility] if equivalent level of care is available:: No  Covid Evaluation: Confirmed COVID Negative  Diagnosis: Acute CVA (cerebrovascular accident) [facility](HCC) [[id number]][[id number]]  Admitting Physician: Synetta FailMELVIN, ALEXANDER B [[id number]]  Attending Physician: Synetta FailMELVIN, ALEXANDER B [[id number]]          B Medical/Surgery History Past Medical History:  Diagnosis Date   Cirrhosis (HCC)    COPD (chronic obstructive pulmonary disease) (HCC)    Gastric ulcer    Stroke (HCC)    History reviewed. No pertinent surgical history.   A IV Location/Drains/Wounds Patient Lines/Drains/Airways Status     Active Line/Drains/Airways     Name Placement date Placement time Site Days   Peripheral IV 09/10/22 22 G Right Antecubital 09/10/22  1041  Antecubital  less than 1   Peripheral IV 09/10/22 22 G Anterior;Right Forearm 09/10/22  1000  Forearm  less than 1   Peripheral IV 09/10/22 20 G  Anterior;Right;Upper Arm 09/10/22  1035  Arm  less than 1   External Urinary Catheter 09/10/22  1655  --  less than 1            Intake/Output Last 24 hours  Intake/Output Summary (Last 24 hours) at 09/10/2022 1905 Last data filed at 09/10/2022 1605 Gross per 24 hour  Intake 350 ml  Output --  Net 350 ml    Labs/Imaging Results for orders placed or performed during the hospital encounter of 09/10/22 (from the past 48 hour(s))  Lactic acid, plasma     Status: Abnormal   Collection Time: 09/10/22  9:57 AM  Result Value Ref Range   Lactic Acid, Venous 3.5 (HH) 0.5 - 1.9 mmol/L    Comment: CRITICAL RESULT CALLED TO, READ BACK BY AND VERIFIED WITH VT.DEVOLT,RN 1056 09/10/22 CLARK,S Performed at [facility], [address] [place], [place] [address]   Ammonia     Status: Abnormal   Collection Time: 09/10/22  9:58 AM  Result Value Ref Range   Ammonia 99 (H) 9 - 35 umol/L    Comment: HEMOLYSIS AT THIS LEVEL MAY AFFECT RESULT Performed at [facility], [address] [place], [place] [address]   TSH     Status: Abnormal   Collection Time: 09/10/22  9:58 AM  Result Value Ref Range   TSH 10.984 (H) 0.350 - 4.500 uIU/mL    Comment: Performed by a 3rd Generation assay  with a functional sensitivity of <=0.01 uIU/mL. Performed at [facility], [address] [place], [place] [address]   Resp panel by RT-PCR (RSV, Flu A&B, Covid) Anterior Nasal Swab     Status: None   Collection Time: 09/10/22  9:58 AM   Specimen: Anterior Nasal Swab  Result Value Ref Range   SARS Coronavirus 2 by RT PCR NEGATIVE NEGATIVE    Comment: (NOTE) SARS-CoV-2 target nucleic acids are NOT DETECTED.  The SARS-CoV-2 RNA is generally detectable in upper respiratory specimens during the acute phase of infection. The lowest concentration of SARS-CoV-2 viral copies this assay can detect is 138 copies/mL. A negative result does not preclude SARS-Cov-2 infection and should not be used as the sole  basis for treatment or other patient management decisions. A negative result may occur with  improper specimen collection/handling, submission of specimen other than nasopharyngeal swab, presence of viral mutation(s) within the areas targeted by this assay, and inadequate number of viral copies(<138 copies/mL). A negative result must be combined with clinical observations, patient history, and epidemiological information. The expected result is Negative.  Fact Sheet for Patients:  BloggerCourse.com  Fact Sheet for Healthcare Providers:  SeriousBroker.it  This test is no t yet approved or cleared by the [place] FDA and  has been authorized for detection and/or diagnosis of SARS-CoV-2 by FDA under an Emergency Use Authorization (EUA). This EUA will remain  in effect (meaning this test can be used) for the duration of the COVID-19 declaration under Section 564(b)(1) of the Act, 21 U.S.C.section 360bbb-3(b)(1), unless the authorization is terminated  or revoked sooner.       Influenza A by PCR NEGATIVE NEGATIVE   Influenza B by PCR NEGATIVE NEGATIVE    Comment: (NOTE) The Xpert Xpress SARS-CoV-2/FLU/RSV plus assay is intended as an aid in the diagnosis of influenza from Nasopharyngeal swab specimens and should not be used as a sole basis for treatment. Nasal washings and aspirates are unacceptable for Xpert Xpress SARS-CoV-2/FLU/RSV testing.  Fact Sheet for Patients: BloggerCourse.com  Fact Sheet for Healthcare Providers: SeriousBroker.it  This test is not yet approved or cleared by the [place] FDA and has been authorized for detection and/or diagnosis of SARS-CoV-2 by FDA under an Emergency Use Authorization (EUA). This EUA will remain in effect (meaning this test can be used) for the duration of the COVID-19 declaration under Section 564(b)(1) of the Act, 21  U.S.C. section 360bbb-3(b)(1), unless the authorization is terminated or revoked.     Resp Syncytial Virus by PCR NEGATIVE NEGATIVE    Comment: (NOTE) Fact Sheet for Patients: BloggerCourse.com  Fact Sheet for Healthcare Providers: SeriousBroker.it  This test is not yet approved or cleared by the [place] FDA and has been authorized for detection and/or diagnosis of SARS-CoV-2 by FDA under an Emergency Use Authorization (EUA). This EUA will remain in effect (meaning this test can be used) for the duration of the COVID-19 declaration under Section 564(b)(1) of the Act, 21 U.S.C. section 360bbb-3(b)(1), unless the authorization is terminated or revoked.  Performed at [facility], [address] [place], [place] [address]   CBC with Differential     Status: Abnormal   Collection Time: 09/10/22 10:00 AM  Result Value Ref Range   WBC 4.1 4.0 - 10.5 K/uL   RBC 2.56 (L) 4.22 - 5.81 MIL/uL   Hemoglobin 8.8 (L) 13.0 - 17.0 g/dL   HCT 95.1 (L) 88.4 - 16.6 %   MCV 100.8 (H) 80.0 -  100.0 fL   MCH 34.4 (H) 26.0 - 34.0 pg   MCHC 34.1 30.0 - 36.0 g/dL   RDW 33.8 (H) 25.0 - 53.9 %   Platelets 85 (L) 150 - 400 K/uL    Comment: Immature Platelet Fraction may be clinically indicated, consider ordering this additional test [id number] REPEATED TO VERIFY    nRBC 0.0 0.0 - 0.2 %   Neutrophils Relative % 54 %   Neutro Abs 2.2 1.7 - 7.7 K/uL   Lymphocytes Relative 27 %   Lymphs Abs 1.1 0.7 - 4.0 K/uL   Monocytes Relative 16 %   Monocytes Absolute 0.6 0.1 - 1.0 K/uL   Eosinophils Relative 2 %   Eosinophils Absolute 0.1 0.0 - 0.5 K/uL   Basophils Relative 1 %   Basophils Absolute 0.0 0.0 - 0.1 K/uL   Immature Granulocytes 0 %   Abs Immature Granulocytes 0.01 0.00 - 0.07 K/uL    Comment: Performed at [facility], [address] [place], [place] [address]  Comprehensive metabolic panel     Status: Abnormal   Collection  Time: 09/10/22 10:00 AM  Result Value Ref Range   Sodium 139 135 - 145 mmol/L   Potassium 4.0 3.5 - 5.1 mmol/L   Chloride 110 98 - 111 mmol/L   CO2 21 (L) 22 - 32 mmol/L   Glucose, Bld 95 70 - 99 mg/dL    Comment: Glucose reference range applies only to samples taken after fasting for at least 8 hours.   BUN 23 8 - 23 mg/dL   Creatinine, Ser 0.97 (H) 0.61 - 1.24 mg/dL   Calcium 7.8 (L) 8.9 - 10.3 mg/dL   Total Protein 6.7 6.5 - 8.1 g/dL   Albumin 1.7 (L) 3.5 - 5.0 g/dL   AST 353 (H) 15 - 41 U/L   ALT 64 (H) 0 - 44 U/L   Alkaline Phosphatase 67 38 - 126 U/L   Total Bilirubin 1.8 (H) 0.3 - 1.2 mg/dL   GFR, Estimated 35 (L) >60 mL/min    Comment: (NOTE) Calculated using the CKD-EPI Creatinine Equation (2021)    Anion gap 8 5 - 15    Comment: Performed at [facility], [address], [place], [place] [address]  Lipase, blood     Status: Abnormal   Collection Time: 09/10/22 10:00 AM  Result Value Ref Range   Lipase 58 (H) 11 - 51 U/L    Comment: Performed at [facility], [address] [place], [place] [address]  Brain natriuretic peptide     Status: Abnormal   Collection Time: 09/10/22 10:00 AM  Result Value Ref Range   B Natriuretic Peptide 165.2 (H) 0.0 - 100.0 pg/mL    Comment: Performed at [facility], [address], [place], [place] [address]  Troponin I (High Sensitivity)     Status: None   Collection Time: 09/10/22 10:00 AM  Result Value Ref Range   Troponin I (High Sensitivity) 10 <18 ng/L    Comment: (NOTE) Elevated high sensitivity troponin I (hsTnI) values and significant  changes across serial measurements may suggest ACS but many other  chronic and acute conditions are known to elevate hsTnI results.  Refer to the "Links" section for chest pain algorithms and additional  guidance. Performed at [facility], [address] [place], [place] [address]   POC CBG, ED     Status: None   Collection Time: 09/10/22 10:07 AM  Result Value  Ref Range   Glucose-Capillary 75 70 - 99 mg/dL    Comment: Glucose reference range applies only to samples taken after fasting for at least 8 hours.  Ethanol     Status: None   Collection Time: 09/10/22 10:09 AM  Result Value Ref Range   Alcohol, Ethyl (B) <10 <10 mg/dL    Comment: (NOTE) Lowest detectable limit for serum alcohol is 10 mg/dL.  For medical purposes only. Performed at [facility], [address] [place], [place] [address]   Protime-INR     Status: Abnormal   Collection Time: 09/10/22 10:09 AM  Result Value Ref Range   Prothrombin Time 17.5 (H) 11.4 - 15.2 seconds   INR 1.5 (H) 0.8 - 1.2    Comment: (NOTE) INR goal varies based on device and disease states. Performed at [facility], [address] [place], [place] [address]   APTT     Status: None   Collection Time: 09/10/22 10:09 AM  Result Value Ref Range   aPTT 35 24 - 36 seconds    Comment: Performed at [facility], [address] [place], [place] [address]  Urinalysis, Routine w reflex microscopic     Status: Abnormal   Collection Time: 09/10/22  5:55 PM  Result Value Ref Range   Color, Urine YELLOW YELLOW   APPearance CLEAR CLEAR   Specific Gravity, Urine 1.026 1.005 - 1.030   pH 5.0 5.0 - 8.0   Glucose, UA NEGATIVE NEGATIVE mg/dL   Hgb urine dipstick SMALL (A) NEGATIVE   Bilirubin Urine NEGATIVE NEGATIVE   Ketones, ur NEGATIVE NEGATIVE mg/dL   Protein, ur NEGATIVE NEGATIVE mg/dL   Nitrite NEGATIVE NEGATIVE   Leukocytes,Ua NEGATIVE NEGATIVE   RBC / HPF 0-5 0 - 5 RBC/hpf   WBC, UA 0-5 0 - 5 WBC/hpf   Bacteria, UA RARE (A) NONE SEEN   Squamous Epithelial / HPF 0-5 0 - 5 /HPF   Mucus PRESENT    Hyaline Casts, UA PRESENT     Comment: Performed at [facility], [address] [place], [place] [address]   MR BRAIN WO CONTRAST  Result Date: 09/10/2022 CLINICAL DATA:  Provided history: Neuro deficit, acute, stroke suspected. Slurred speech, left-sided weakness, aphasia.  EXAM: MRI HEAD WITHOUT CONTRAST TECHNIQUE: Multiplanar, multiecho pulse sequences of the brain and surrounding structures were obtained without intravenous contrast. COMPARISON:  Noncontrast head CT and CT angiogram head/neck performed earlier today 09/10/2022. Brain MRI 07/01/2022. FINDINGS: Intermittently motion degraded examination, limiting evaluation. Most notably, there is moderate motion degradation of the least motion degraded axial T2 FLAIR sequence, moderate-to-severe motion degradation of the axial T2 sequence, severe motion degradation of the axial SWI sequence, moderate-to-severe motion degradation of the sagittal T1-weighted sequence, severe motion degradation of the axial T1-weighted sequence and moderate motion degradation of the least motion degraded coronal T2-weighted sequence. Brain: Moderate generalized cerebral atrophy. Mild cerebellar atrophy. 15 mm acute infarct centered within the posterior left frontal lobe white matter with subtle involvement of the overlying cortex (for instance as seen on series 3, images thirty-five and 36) (series 4, image 18). Redemonstrated chronic infarct within the right corona radiata/basal ganglia with ex vacuo dilatation of the frontal horn and body of the right lateral ventricle. Redemonstrated chronic lacunar infarcts elsewhere within the bilateral cerebral hemispheric white matter, bilateral deep gray nuclei and within the right aspect of the pons. Background advanced patchy and confluent T2 FLAIR hyperintense signal abnormality within the cerebral white  matter, nonspecific but compatible with chronic small vessel ischemic disease. Within the limitations of motion degradation, there is no appreciable intracranial mass or extra-axial fluid collection. No midline shift. Vascular: Maintained flow voids within the proximal large arterial vessels. Skull and upper cervical spine: No focal suspicious marrow lesion. Sinuses/Orbits: No mass or acute finding within the  imaged orbits. No significant paranasal sinus disease. IMPRESSION: 1. Significantly motion degraded examination. 2. 15 mm acute cortical/subcortical infarct within the posterior left frontal lobe (MCA territory). 3. Background parenchymal atrophy, advanced chronic small vessel ischemic disease and chronic infarcts, as detailed and unchanged from the prior brain MRI of 07/01/2022. Electronically Signed   By: Jackey Loge D.O.   On: 09/10/2022 14:37   DG Chest Portable 1 View  Result Date: 09/10/2022 CLINICAL DATA:  Mental status change, concern for aspiration EXAM: PORTABLE CHEST 1 VIEW COMPARISON:  06/30/2022 FINDINGS: Left lower lobe retrocardiac collapse/consolidation noted obscuring the left hemidiaphragm compatible with pneumonia. No large effusion. Right lung remains clear. Stable heart size and vascularity. Negative for edema or pneumothorax. Trachea midline. Degenerative changes of the spine. IMPRESSION: Left lower lobe retrocardiac collapse/consolidation. Electronically Signed   By: Judie Petit.  Shick M.D.   On: 09/10/2022 12:23   DG Elbow 2 Views Left  Result Date: 09/10/2022 CLINICAL DATA:  Abrasion, bruising left elbow EXAM: LEFT ELBOW - 2 VIEW COMPARISON:  None Available. FINDINGS: Moderate degenerative arthropathy of the left elbow joint with joint space loss, sclerosis and bony spurring. Bones are osteopenic. No acute osseous finding or displaced fracture. No large effusion. Extremity subcutaneous edema noted. IMPRESSION: Degenerative changes as above. No acute osseous finding by plain radiography. Electronically Signed   By: Judie Petit.  Shick M.D.   On: 09/10/2022 12:21   CT ANGIO HEAD NECK W WO CM W PERF (CODE STROKE)  Result Date: 09/10/2022 CLINICAL DATA:  Stroke suspected EXAM: CT ANGIOGRAPHY HEAD AND NECK CT PERFUSION BRAIN TECHNIQUE: Multidetector CT imaging of the head and neck was performed using the standard protocol during bolus administration of intravenous contrast. Multiplanar CT image  reconstructions and MIPs were obtained to evaluate the vascular anatomy. Carotid stenosis measurements (when applicable) are obtained utilizing NASCET criteria, using the distal internal carotid diameter as the denominator. Multiphase CT imaging of the brain was performed following IV bolus contrast injection. Subsequent parametric perfusion maps were calculated using RAPID software. RADIATION DOSE REDUCTION: This exam was performed according to the departmental dose-optimization program which includes automated exposure control, adjustment of the mA and/or kV according to patient size and/or use of iterative reconstruction technique. CONTRAST:  21mL OMNIPAQUE IOHEXOL 350 MG/ML SOLN COMPARISON:  CTA head/neck 10/13/18 FINDINGS: CT HEAD FINDINGS Please see same-day CT brain for intracranial findings. CTA NECK FINDINGS Aortic arch: Standard branching. Imaged portion shows no evidence of aneurysm or dissection. No significant stenosis of the major arch vessel origins. Right carotid system: There is moderate plaque burden of the right carotid bifurcation that results in minimal overall stenosis of the origin of the right ICA. Atherosclerotic plaque is also seen in the cavernous segment of the right ICA with associated mild stenosis. Left carotid system: No evidence of dissection, stenosis (50% or greater) or occlusion. There is a small laterally projecting outpouching along the distal petrous segment of the left ICA (series 9, image 112) measuring up to 2 mm, which could represent a small aneurysm. 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 The degree of stenosis and size of the pseudoaneurysm have increased from 2020. there is mild narrowing of the origin of the left vertebral artery. Skeleton: Mild multilevel degenerative changes.  No worrisome lesions. Other neck: Subcentimeter left thyroid nodule. Nonspecific focal soft tissue thickening along the midline submental region (series 11, image 89) measuring 1.0 x 1.2 x 0.6 cm. There is nonspecific prominence of the frenulum of the tongue, likely near the confluence of major salivary ducts (series 9, image 193).These findings are grossly unchanged compared to 10/13/18 Upper chest: Moderate left-sided pleural effusion. Aortic atherosclerotic calcifications. Ground-glass opacity along the anterior aspect of the left upper lobe measuring up to 2.0 x 1.2 cm. Review of the MIP images confirms the above findings CTA HEAD FINDINGS Anterior circulation: No significant stenosis, proximal occlusion, aneurysm, or vascular malformation. Posterior circulation: There is mild focal narrowing in the mid segment of the basilar artery (series 10, image 85). There is moderate focal stenosis of the P1 segment of the right PCA (series 9, image 101). There is moderate stenosis in the distal P2 segment of the left PCA (series 9, image 91). Venous sinuses: As permitted by contrast timing, patent. 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 The degree of stenosis and size of the pseudoaneurysm have increased from 2020. 2. Moderate focal stenosis of the P1 segment of the right PCA and moderate stenosis in the distal P2 segment of the left PCA. These findings are unchanged compared to 2020. 3. 2 mm outpouching along the distal petrous segment of the left ICA, which could represent a small aneurysm. 4. New moderate left-sided pleural effusion and ground-glass  opacity along the anterior aspect of the left upper lobe measuring up to 2.0 x 1.2 cm. Although this could be infectious or inflammatory, further evaluation with a dedicated CT of the chest is recommended. Findings were discussed with Dr. Kirkpatrick at 11:11 AM on 09/10/22 Aortic Atherosclerosis (ICD10-I70.0). Electronically Signed   By: Hemant  Desai M.D.   On: 09/10/2022 11:55   CT HEAD CODE STROKE WO CONTRAST  Result Date: 09/10/2022 CLINICAL DATA:  Code stroke. Neuro deficit, acute, stroke suspected. EXAM: CT HEAD WITHOUT CONTRAST TECHNIQUE: Contiguous axial images were obtained from the base of the skull through the vertex without intravenous contrast. RADIATION DOSE REDUCTION: This exam was performed according to the departmental dose-optimization program which includes automated exposure control, adjustment of the mA and/or kV according to patient size and/or use of iterative reconstruction technique. COMPARISON:  Brain MRI 07/01/2022. Prior head CT examinations 06/30/2022 and earlier. FINDINGS: Brain: Moderate generalized cerebral atrophy. Mild cerebellar atrophy. Chronic infarct within the right corona radiata/basal ganglia with ex vacuo dilatation of the frontal horn and body of the right lateral ventricle. Redemonstrated chronic lacunar infarcts elsewhere within the right corona radiata, bilateral deep gray nuclei and within the right aspect of the pons. Background advanced patchy and confluent hypoattenuation within the cerebral white matter, nonspecific but compatible with chronic small vessel ischemic disease. There is no acute intracranial hemorrhage. No demarcated cortical infarct. No extra-axial fluid collection. No evidence of an intracranial mass. No midline shift. Vascular: No hyperdense  vessel.  Atherosclerotic calcifications. Skull: No fracture or aggressive osseous lesion. Sinuses/Orbits: No mass or acute finding within the imaged orbits. No significant paranasal sinus disease. ASPECTS  [facility] Stroke Program Early CT Score) - Ganglionic level infarction (caudate, lentiform nuclei, internal capsule, insula, M1-M3 cortex): 7 - Supraganglionic infarction (M4-M6 cortex): 3 Total score (0-10 with 10 being normal): 10 (when discounting chronic infarcts). No acute intracranial abnormality is identified. These results were communicated to Dr. Leonel Ramsay at 10:29 amon 1/8/2024by text page via the [facility] messaging system. IMPRESSION: 1. No evidence of acute intracranial abnormality. 2. Parenchymal atrophy, advanced chronic small vessel disease and chronic infarcts, as detailed and not appreciably changed from the prior brain MRI of 07/01/2022. Electronically Signed   By: Kellie Simmering D.O.   On: 09/10/2022 10:30    Pending Labs Unresulted Labs (From admission, onward)     Start     Ordered   09/11/22 0500  Lipid panel  (Labs)  Tomorrow morning,   R       Comments: Fasting    09/10/22 1608   09/11/22 0500  Hemoglobin A1c  (Labs)  Tomorrow morning,   R       Comments: To assess prior glycemic control    09/10/22 1608   09/11/22 0500  Procalcitonin  Daily,   R      09/10/22 1625   09/10/22 1626  Vitamin B12  Add-on,   AD        09/10/22 1625   09/10/22 1626  Folate  Add-on,   AD        09/10/22 1625   09/10/22 1625  Procalcitonin - Baseline  ONCE - URGENT,   URGENT        09/10/22 1625   09/10/22 1625  Respiratory (~20 pathogens) panel by PCR  (Respiratory panel by PCR (~20 pathogens, ~24 hr TAT)  w precautions)  Add-on,   AD        09/10/22 1625   09/10/22 1604  HIV Antibody (routine testing w rflx)  (HIV Antibody (Routine testing w reflex) panel)  Once,   R        09/10/22 1608   09/10/22 1009  Urine rapid drug screen (hosp performed)  Once,   STAT        09/10/22 1009   09/10/22 0958  Urine Culture  Once,   URGENT       Question:  Indication  Answer:  Altered mental status (if no other cause identified)   09/10/22 0959   09/10/22 0957  Lactic acid, plasma  Now then every 2  hours,   R (with STAT occurrences)      09/10/22 0959            Vitals/Pain Today's Vitals   09/10/22 1700 09/10/22 1730 09/10/22 1735 09/10/22 1845  BP: 127/78 132/71  (!) 94/57  Pulse: 94 92  92  Resp: 11 20  17   Temp:   98.5 F (36.9 C)   TempSrc:   Oral   SpO2: 100% 100%  99%  Weight:      Height:      PainSc:        Isolation Precautions Droplet precaution  Medications Medications  atorvastatin (LIPITOR) tablet 40 mg (has no administration in time range)  tamsulosin (FLOMAX) capsule 0.4 mg (has no administration in time range)   stroke: early stages of recovery book (has no administration in time range)  acetaminophen (TYLENOL) tablet 650 mg (has no administration in time range)  Or  acetaminophen (TYLENOL) 160 MG/5ML solution 650 mg (has no administration in time range)    Or  acetaminophen (TYLENOL) suppository 650 mg (has no administration in time range)  senna-docusate (Senokot-S) tablet 1 tablet (has no administration in time range)  aspirin EC tablet 81 mg (has no administration in time range)  pantoprazole (PROTONIX) EC tablet 40 mg (has no administration in time range)  lactulose (CHRONULAC) 10 GM/15ML solution 30 g (has no administration in time range)  lactulose (CHRONULAC) enema 200 gm (has no administration in time range)  0.9 %  sodium chloride infusion ( Intravenous New Bag/Given 09/10/22 1724)  ipratropium-albuterol (DUONEB) 0.5-2.5 (3) MG/3ML nebulizer solution 3 mL (3 mLs Nebulization Given 09/10/22 1724)  umeclidinium bromide (INCRUSE ELLIPTA) 62.5 MCG/ACT 1 puff (has no administration in time range)  clopidogrel (PLAVIX) tablet 300 mg (300 mg Oral Not Given 09/10/22 1802)    And  clopidogrel (PLAVIX) tablet 75 mg (has no administration in time range)  aspirin suppository 300 mg (has no administration in time range)  mometasone-formoterol (DULERA) 200-5 MCG/ACT inhaler 2 puff (has no administration in time range)  iohexol (OMNIPAQUE) 350 MG/ML  injection 75 mL (75 mLs Intravenous Contrast Given 09/10/22 1040)  cefTRIAXone (ROCEPHIN) 1 g in sodium chloride 0.9 % 100 mL IVPB (0 g Intravenous Stopped 09/10/22 1605)  azithromycin (ZITHROMAX) 500 mg in sodium chloride 0.9 % 250 mL IVPB (0 mg Intravenous Stopped 09/10/22 1605)  albumin human 25 % solution 12.5 g (12.5 g Intravenous New Bag/Given 09/10/22 1724)    Mobility non-ambulatory High fall risk   Focused Assessments Cardiac Assessment Handoff:  Cardiac Rhythm: Normal sinus rhythm No results found for: "CKTOTAL", "CKMB", "CKMBINDEX", "TROPONINI" No results found for: "DDIMER" Does the Patient currently have chest pain? No   , Neuro Assessment Handoff:  Swallow screen pass? No  Cardiac Rhythm: Normal sinus rhythm NIH Stroke Scale  Dizziness Present: No Headache Present: No Interval: Shift assessment Level of Consciousness (1a.)   : Alert, keenly responsive LOC Questions (1b. )   : Answers neither question correctly LOC Commands (1c. )   : Performs both tasks correctly Best Gaze (2. )  : Normal Visual (3. )  : No visual loss Facial Palsy (4. )    : Minor paralysis Motor Arm, Left (5a. )   : Some effort against gravity Motor Arm, Right (5b. ) : No drift Motor Leg, Left (6a. )  : No effort against gravity Motor Leg, Right (6b. ) : Some effort against gravity Limb Ataxia (7. ): Absent Sensory (8. )  : Normal, no sensory loss Best Language (9. )  : Severe aphasia Dysarthria (10. ): Severe dysarthria, patient's speech is so slurred as to be unintelligible in the absence of or out of proportion to any dysphasia, or is mute/anarthric Extinction/Inattention (11.)   : No Abnormality DO NOT USE: Modified SS Total : 11 Complete NIHSS TOTAL: 14 Last date known well: 09/09/22 Last time known well: 2251 Neuro Assessment: Exceptions to WDL Neuro Checks:   Initial (09/10/22 1030)  Has TPA been given? No If patient is a Neuro Trauma and patient is going to OR before floor call report  to 4N Charge nurse: [phone] or [phone]  , Pulmonary Assessment Handoff:  Lung sounds: Bilateral Breath Sounds: Expiratory wheezes L Breath Sounds: Diminished R Breath Sounds: Rhonchi O2 Device: Aerosol Mask      R Recommendations: See Admitting Provider Note  Report given to:   Additional Notes: n/a

## 2022-09-10 NOTE — ED Notes (Signed)
Pt back from MRI 

## 2022-09-10 NOTE — Evaluation (Signed)
Physical Therapy Evaluation Patient Details Name: Jacob Bass MRN: [id number] DOB: 04/25/1961 Today's Date: 09/10/2022  History of Present Illness  62 y.o. male presents to [facility] hospital on 09/10/2022 with AMS and L weakness from [facility]. 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 PMH includes CVA, COPD, cirrhosis, gastric ulcer.  Clinical Impression  Pt presents to PT with deficits in functional mobility, strength, power, endurance, balance, cognition. Pt reports mobilizing at a modI level with use of rollator, performing ADLs independently. PLOF will need to be confirmed with staff at [facility]. Pt currently requires significant physical assistance to mobilize, with LUE weakness and BLE weakness and pitting edema. Pt has difficulty maintaining sitting, and is at a high risk for falls. PT recommends SNF placement when ready for discharge.     Recommendations for follow up therapy are one component of a multi-disciplinary discharge planning process, led by the attending physician.  Recommendations may be updated based on patient status, additional functional criteria and insurance authorization.  Follow Up Recommendations Skilled nursing-short term rehab (<3 hours/day) Can patient physically be transported by private vehicle: No    Assistance Recommended at Discharge Frequent or constant Supervision/Assistance  Patient can return home with the following  Two people to help with walking and/or transfers;Two people to help with bathing/dressing/bathroom;Assistance with cooking/housework;Assistance with feeding;Direct supervision/assist for medications management;Direct supervision/assist for financial management;Assist for transportation;Help with stairs or ramp for entrance    Equipment Recommendations  (defer to post-acute)  Recommendations for Other Services       Functional Status Assessment Patient has had a recent decline in their functional status and demonstrates the ability to  make significant improvements in function in a reasonable and predictable amount of time.     Precautions / Restrictions Precautions Precautions: Fall Restrictions Weight Bearing Restrictions: No      Mobility  Bed Mobility Overal bed mobility: Needs Assistance Bed Mobility: Supine to Sit, Sit to Supine, Rolling Rolling: Mod assist   Supine to sit: Max assist, HOB elevated Sit to supine: Max assist, HOB elevated        Transfers Overall transfer level:  (deferred 2/2 impaired sitting balance and weakness)                      Ambulation/Gait                  Stairs            Wheelchair Mobility    Modified Rankin (Stroke Patients Only) Modified Rankin (Stroke Patients Only) Pre-Morbid Rankin Score: Moderate disability (per pt report) Modified Rankin: Severe disability     Balance Overall balance assessment: Needs assistance Sitting-balance support: Single extremity supported, Feet unsupported Sitting balance-Leahy Scale: Poor Sitting balance - Comments: maxA Postural control: Posterior lean                                   Pertinent Vitals/Pain Pain Assessment Pain Assessment: No/denies pain    Home Living Family/patient expects to be discharged to:: Skilled nursing facility                        Prior Function Prior Level of Function : Patient poor historian/Family not available             Mobility Comments: pt reports he is able to ambulate with support of a rollator ADLs Comments: pt reports  he performs all his ADLs independently including cooking     Hand Dominance   Dominant Hand: Right    Extremity/Trunk Assessment   Upper Extremity Assessment Upper Extremity Assessment: LUE deficits/detail LUE Deficits / Details: decorticate positioning, profound weakness, limited initiation of use by pt unless cued by PT.    Lower Extremity Assessment Lower Extremity Assessment: RLE  deficits/detail;LLE deficits/detail RLE Deficits / Details: bilateral pitting edema, able 3/5 PF/DF, 3/5 hip flexion, 4-/5 knee flexion/extension LLE Deficits / Details: bilateral pitting edema, able 3/5 PF/DF, 3/5 hip flexion, 3+/5 knee flexion/extension    Cervical / Trunk Assessment Cervical / Trunk Assessment: Kyphotic  Communication   Communication: Expressive difficulties (dysarthria)  Cognition Arousal/Alertness: Awake/alert Behavior During Therapy: Flat affect Overall Cognitive Status: No family/caregiver present to determine baseline cognitive functioning                                 General Comments: pt follows commands well with increased time, pt reports independence with mobility and ADLs, however need to confirm with facility staff as pt has a LUE contracture and it appears his mobility may have been failry impaired even prior to this CVA.        General Comments General comments (skin integrity, edema, etc.): VSS on RA, pt with significant wheezing although denies SOB, sats stable    Exercises     Assessment/Plan    PT Assessment Patient needs continued PT services  PT Problem List Decreased strength;Decreased activity tolerance;Decreased balance;Decreased mobility;Decreased coordination;Decreased cognition;Decreased knowledge of use of DME;Decreased safety awareness;Decreased knowledge of precautions       PT Treatment Interventions DME instruction;Gait training;Therapeutic activities;Functional mobility training;Therapeutic exercise;Neuromuscular re-education;Balance training;Cognitive remediation;Patient/family education;Wheelchair mobility training    PT Goals (Current goals can be found in the Care Plan section)  Acute Rehab PT Goals Patient Stated Goal: to improve strenght and mobility, reduce falls risk PT Goal Formulation: With patient Time For Goal Achievement: 09/24/22 Potential to Achieve Goals: Fair    Frequency Min 3X/week      Co-evaluation               AM-PAC PT "6 Clicks" Mobility  Outcome Measure Help needed turning from your back to your side while in a flat bed without using bedrails?: A Lot Help needed moving from lying on your back to sitting on the side of a flat bed without using bedrails?: A Lot Help needed moving to and from a bed to a chair (including a wheelchair)?: Total Help needed standing up from a chair using your arms (e.g., wheelchair or bedside chair)?: Total Help needed to walk in hospital room?: Total Help needed climbing 3-5 steps with a railing? : Total 6 Click Score: 8    End of Session   Activity Tolerance: Patient tolerated treatment well Patient left: in bed;with call bell/phone within reach Nurse Communication: Mobility status PT Visit Diagnosis: Other abnormalities of gait and mobility (R26.89);Muscle weakness (generalized) (M62.81);Hemiplegia and hemiparesis Hemiplegia - Right/Left: Left Hemiplegia - caused by: Cerebral infarction    Time: 8657-8469 PT Time Calculation (min) (ACUTE ONLY): 24 min   Charges:   PT Evaluation $PT Eval Low Complexity: 1 Low          Arlyss Gandy, PT, DPT Acute Rehabilitation Office [phone]   Arlyss Gandy 09/10/2022, 5:36 PM

## 2022-09-10 NOTE — Code Documentation (Signed)
Stroke Response Nurse Documentation Code Documentation  Jacob Bass is a 63 y.o. male arriving to [facility]  via  EMS  on 09/10/22 with past medical hx of Cirrhosis ([place]), COPD (chronic obstructive pulmonary disease) ([place]), Gastric ulcer, and Stroke  . On No antithrombotic. Code stroke was activated by ED.   Patient from [facility] where he was LKW at 2251 on 09/09/22 and now complaining of slurred speech, left sided weakness, aphasia. Facility RN says patient was at his normal at 2251 making jokes. They noticed he was wheezing this am and having speech trouble. They called EMS and when MD spoke with facility they mentioned concern for COVID and then eventually stroke. BP and CBG WNL. Pt on room air but wheezing.   Stroke team at the bedside on patient arrival. Labs drawn and patient cleared for CT by Dr. Sherry Ruffing. Patient to CT with team. NIHSS 13, see documentation for details and code stroke times. Patient with disoriented, left facial droop, left arm weakness, bilateral leg weakness, Expressive aphasia , and dysarthria  on exam. The following imaging was completed:  CT/CTA. Patient is not a candidate for IV Thrombolytic due to outside the window. Patient is not a candidate for IR due to no LVO.   Care Plan: Q2x12 NIHSS/vitals, permissive HTN, MRI.   Bedside handoff with ED RN Darnelle Maffucci.    Cortny Bambach, Rande Brunt  Stroke Response RN

## 2022-09-10 NOTE — Evaluation (Signed)
Clinical/Bedside Swallow Evaluation Patient Details  Name: Jacob Bass MRN: [id number] Date of Birth: 05-Sep-1960  Today's Date: 09/10/2022 Time: SLP Start Time (ACUTE ONLY): 1249 SLP Stop Time (ACUTE ONLY): 1300 SLP Time Calculation (min) (ACUTE ONLY): 11 min  Past Medical History:  Past Medical History:  Diagnosis Date   Cirrhosis (HCC)    COPD (chronic obstructive pulmonary disease) (HCC)    Gastric ulcer    Stroke (HCC)    Past Surgical History: History reviewed. No pertinent surgical history. HPI:  Jacob Bass is a 62 y.o. male who presented to ED from [facility] via EMS with left sided weakness, speech changes, wheezing. PMH/PSH includes prior CVA, subarachnoid hemorrhage, cirrhosis, hep C, hx of etOH abuse, dementia, COPD.  Pt known to SLP from prior admissions, with baseline dysarthria and oral dysphagia. He was most recently seen by SLP on 04/13/22 with recommendations for Dysphagia 1 solids and thin liquids.    Assessment / Plan / Recommendation  Clinical Impression  Mr. Ruffino presents with acute on chronic dysphagia. He is known to this examiner from prior admissions.  Minimal verbalizations; f/c intermittently. Demonstrated oral holding of saliva and ice chips, teaspoons of water, requiring cues to swallow.  He intermittently expectorated saliva/water during exhalation; swallows were palpable but followed by wet phonation and intermittent cough. He is not ready for a PO diet and is a high aspiration risk at this time.  Work-up is pending. Recommend continued NPO for now. SLP will follow. SLP Visit Diagnosis: Dysphagia, oropharyngeal phase (R13.12)           Diet Recommendation   NPO       Other  Recommendations Oral Care Recommendations: Oral care QID    Recommendations for follow up therapy are one component of a multi-disciplinary discharge planning process, led by the attending physician.  Recommendations may be updated based on patient status, additional functional  criteria and insurance authorization.  Follow up Recommendations Skilled nursing-short term rehab (<3 hours/day)          Functional Status Assessment Patient has had a recent decline in their functional status and demonstrates the ability to make significant improvements in function in a reasonable and predictable amount of time.  Frequency and Duration min 2x/week  2 weeks       Prognosis Prognosis for Safe Diet Advancement: Fair      Swallow Study   General Date of Onset: 09/10/22 HPI: Jacob Bass is a 62 y.o. male who presented to ED from [facility] via EMS with left sided weakness, speech changes, wheezing. PMH/PSH includes prior CVA, subarachnoid hemorrhage, cirrhosis, hep C, hx of etOH abuse, dementia, COPD.  Pt known to SLP from prior admissions, with baseline dysarthria and oral dysphagia. He was most recently seen by SLP on 04/13/22 with recommendations for Dysphagia 1 solids and thin liquids. Type of Study: Bedside Swallow Evaluation Previous Swallow Assessment: see HPI Diet Prior to this Study: NPO Temperature Spikes Noted: No Respiratory Status: Room air History of Recent Intubation: No Behavior/Cognition: Alert Oral Cavity Assessment: Excessive secretions Oral Care Completed by SLP: No Oral Cavity - Dentition: Edentulous Self-Feeding Abilities: Total assist Patient Positioning: Upright in bed Baseline Vocal Quality: Wet;Low vocal intensity Volitional Cough: Weak Volitional Swallow: Able to elicit    Oral/Motor/Sensory Function Overall Oral Motor/Sensory Function: Generalized oral weakness   Ice Chips Ice chips: Impaired Presentation: Spoon Oral Phase Impairments: Reduced labial seal;Reduced lingual movement/coordination Oral Phase Functional Implications: Oral holding Pharyngeal Phase Impairments: Suspected delayed Swallow;Cough - Delayed  Thin Liquid Thin Liquid: Impaired Oral Phase Impairments: Reduced labial seal Oral Phase Functional Implications: Oral  holding Pharyngeal  Phase Impairments: Suspected delayed Swallow;Cough - Immediate    Nectar Thick Nectar Thick Liquid: Not tested   Honey Thick Honey Thick Liquid: Not tested   Puree Puree: Not tested   Solid     Solid: Not tested      [name] 09/10/2022,1:08 PM   [name] L. Samson Frederic, MA CCC/SLP Clinical Specialist - Acute Care SLP Acute Rehabilitation Services Office number [phone]

## 2022-09-10 NOTE — ED Notes (Signed)
Date and time results received: 09/10/22 1112  (use smartphrase ".now" to insert current time)  Test: lactic acid Critical Value: 3.5  Name of Provider Notified: Tegeler

## 2022-09-10 NOTE — H&P (Signed)
History and Physical   Jacob Bass ZOX:[id number] DOB: 1961-03-06 DOA: 09/10/2022  PCP: Donia Ast., MD   [facility] coming from[facility]  Chief Complaint: Altered mental status, weakness  Patient chart is marked for merger with separate chart as he arrived altered.  Some records that auto populate are not yet up-to-date.  HPI: Jacob Bass is a 62 y.o. male with medical history significant of stroke, hypertension, COPD, anemia, cirrhosis presenting with altered mental status and weakness.  Patient resides at [facility] and his last known normal last night around 10 PM.  Reportedly he is typically talkative and joking and this morning noted to be quiet and not responding appropriately with some possible weakness greater than baseline on the left.  There was reports that he has had COVID recently and there was some concern that he may have had an difficulty swallowing at 1 point and possibly aspirated.  Review of systems limited by patient's aphasia.  He is alert and will nod to some questions.  ED Course: Vital signs in ED significant for blood pressure in the 110s to 140 systolic, heart rate in the 90s to 100s.  Lab workup included CMP with bicarb 21, creatinine elevated 2.09 from baseline of 1, calcium 7.8, albumin 1.7, AST 113 up from recent of 60, ALT 64 up from recent of normal, T. bili 1.8.  CBC with hemoglobin 8.8 down from recent baseline of 10, platelets stable at 85.  PT and INR mildly elevated at 17.5 and 1.5 respectively.  Troponin negative with repeat pending.  BNP mildly elevated at 165.  Lactic acid 3.5 with repeat pending.  TSH elevated at 10.9.  Lipase borderline at 58.  Respiratory panel for flu COVID and RSV negative.  Ammonia level 99.  Urinalysis and UDS pending urine culture pending.  Ethanol level negative.  Imaging study included chest x-ray which showed left lower lobe retrocardiac collapse versus consolidation.  Left elbow x-ray which showed no acute normality.  CT  head which showed no acute normality but did show advanced chronic small vessel disease.  CTA of the head neck showed focal stenosis at right V1 and evidence suspicious for pseudoaneurysm.  Also noted was moderate focal stenosis of the right P1 and left P2.  Incidentally noted was new pleural effusion on the left with groundglass opacities.  MR brain showed motion Gratian but positive for acute infarct.  Patient received ceftriaxone and azithromycin in the ED.  Neurology was consulted on arrival and stated that the patient was positive for stroke acute stroke workup was recommended.  Review of Systems: Unable to obtain full review of systems due to patient's aphasia.  Past Medical History:  Diagnosis Date   Cirrhosis (HCC)    COPD (chronic obstructive pulmonary disease) (HCC)    Gastric ulcer    Stroke (HCC)     History reviewed. No pertinent surgical history.  Social History  has no history on file for tobacco use, alcohol use, and drug use.  Allergies  Allergen Reactions   Penicillins     History reviewed. No pertinent family history. Unable to fully review with patient given his aphasia, awaiting chart merger to update history.  Prior to Admission medications   Medication Sig Start Date End Date Taking? Authorizing Provider  acetaminophen (TYLENOL) 500 MG tablet Take 1,000 mg by mouth every 8 (eight) hours as needed for moderate pain (severe pain).   Yes [provider]  albuterol (VENTOLIN HFA) 108 (90 Base) MCG/ACT inhaler Inhale 2 puffs  into the lungs every 4 (four) hours as needed for wheezing or shortness of breath.   Yes [provider]  amLODipine (NORVASC) 5 MG tablet Take 5 mg by mouth daily.   Yes [provider]  atorvastatin (LIPITOR) 40 MG tablet Take 40 mg by mouth daily.   Yes [provider]  Calcium Carb-Cholecalciferol (CALCIUM + VITAMIN D3) 600-5 MG-MCG TABS Take 1 tablet by mouth daily.   Yes [provider]   ciprofloxacin (CIPRO) 500 MG tablet Take 500 mg by mouth daily. Prophylactic.   Yes [provider]  cyanocobalamin 1000 MCG tablet Take 1,000 mcg by mouth daily.   Yes [provider]  fluticasone-salmeterol (ADVAIR) 250-50 MCG/ACT AEPB Inhale 1 puff into the lungs in the morning and at bedtime.   Yes [provider]  folic acid (FOLVITE) 1 MG tablet Take 1 mg by mouth daily.   Yes [provider]  furosemide (LASIX) 20 MG tablet Take 40 mg by mouth daily.   Yes [provider]  ipratropium-albuterol (DUONEB) 0.5-2.5 (3) MG/3ML SOLN Take 3 mLs by nebulization every 4 (four) hours.   Yes [provider]  lactulose (CHRONULAC) 10 GM/15ML solution Take 30 g by mouth 3 (three) times daily.   Yes [provider]  latanoprost (XALATAN) 0.005 % ophthalmic solution Place 1 drop into both eyes at bedtime.   Yes [provider]  lisinopril (ZESTRIL) 5 MG tablet Take 5 mg by mouth daily.   Yes [provider]  loperamide (IMODIUM A-D) 2 MG tablet Take 4 mg by mouth every 4 (four) hours as needed for diarrhea or loose stools.   Yes [provider]  magnesium oxide (MAG-OX) 400 (240 Mg) MG tablet Take 400 mg by mouth in the morning, at noon, and at bedtime.   Yes [provider]  melatonin 5 MG TABS Take 5 mg by mouth at bedtime.   Yes [provider]  metoprolol tartrate (LOPRESSOR) 25 MG tablet Take 25 mg by mouth 2 (two) times daily.   Yes [provider]  Nutritional Supplements (NUTRITIONAL DRINK) LIQD Take 120 mLs by mouth 3 (three) times daily. House supplement.   Yes [provider]  pantoprazole (PROTONIX) 40 MG tablet Take 40 mg by mouth 2 (two) times daily.   Yes [provider]  tamsulosin (FLOMAX) 0.4 MG CAPS capsule Take 0.4 mg by mouth daily.   Yes [provider]  tiotropium (SPIRIVA) 18 MCG inhalation capsule Place 18 mcg into inhaler and inhale daily.    Yes [provider]    Physical Exam: Vitals:   09/10/22 1500 09/10/22 1515 09/10/22 1545 09/10/22 1600  BP: 138/86 118/78 118/74 129/85  Pulse: 97 96 92 92  Resp: 18 (!) 23 15 14   Temp: 98.1 F (36.7 C)     TempSrc: Oral     SpO2: 100% 100% 100% 100%  Weight:      Height:        Physical Exam Constitutional:      General: He is not in acute distress.    Comments: Elderly, aphasic male.  With facial droop and recent drooling.  Alert and able to nod to some questions.  HENT:     Head: Normocephalic and atraumatic.     Mouth/Throat:     Mouth: Mucous membranes are moist.     Pharynx: Oropharynx is clear.  Eyes:     Extraocular Movements: Extraocular movements intact.     Pupils: Pupils are  equal, round, and reactive to light.  Cardiovascular:     Rate and Rhythm: Normal rate and regular rhythm.     Pulses: Normal pulses.     Heart sounds: Normal heart sounds.  Pulmonary:     Effort: Pulmonary effort is normal. No respiratory distress.     Breath sounds: Normal breath sounds.  Abdominal:     General: Bowel sounds are normal. There is no distension.     Palpations: Abdomen is soft.     Tenderness: There is no abdominal tenderness.  Musculoskeletal:        General: No swelling or deformity.     Right lower leg: Edema present.     Left lower leg: Edema present.  Skin:    General: Skin is warm and dry.  Neurological:     Comments: And is alert but aphasic.  Able to nod to some questions.  Able to roughly make out that he knows his name. Strength RUE and LLE 2-3/5 with contracture of LUE. Strength RUE and RLE 3-4/5     Labs on Admission: I have personally reviewed following labs and imaging studies  CBC: Recent Labs  Lab 09/10/22 1000  WBC 4.1  NEUTROABS 2.2  HGB 8.8*  HCT 25.8*  MCV 100.8*  PLT 85*    Basic Metabolic Panel: Recent Labs  Lab 09/10/22 1000  NA 139  K 4.0  CL 110  CO2 21*  GLUCOSE 95  BUN 23  CREATININE 2.09*  CALCIUM  7.8*    GFR: Estimated Creatinine Clearance: 33.8 mL/min (A) (by C-G formula based on SCr of 2.09 mg/dL (H)).  Liver Function Tests: Recent Labs  Lab 09/10/22 1000  AST 113*  ALT 64*  ALKPHOS 67  BILITOT 1.8*  PROT 6.7  ALBUMIN 1.7*    Urine analysis: No results found for: "COLORURINE", "APPEARANCEUR", "LABSPEC", "PHURINE", "GLUCOSEU", "HGBUR", "BILIRUBINUR", "KETONESUR", "PROTEINUR", "UROBILINOGEN", "NITRITE", "LEUKOCYTESUR"  Radiological Exams on Admission: MR BRAIN WO CONTRAST  Result Date: 09/10/2022 CLINICAL DATA:  Provided history: Neuro deficit, acute, stroke suspected. Slurred speech, left-sided weakness, aphasia. EXAM: MRI HEAD WITHOUT CONTRAST TECHNIQUE: Multiplanar, multiecho pulse sequences of the brain and surrounding structures were obtained without intravenous contrast. COMPARISON:  Noncontrast head CT and CT angiogram head/neck performed earlier today 09/10/2022. Brain MRI 07/01/2022. FINDINGS: Intermittently motion degraded examination, limiting evaluation. Most notably, there is moderate motion degradation of the least motion degraded axial T2 FLAIR sequence, moderate-to-severe motion degradation of the axial T2 sequence, severe motion degradation of the axial SWI sequence, moderate-to-severe motion degradation of the sagittal T1-weighted sequence, severe motion degradation of the axial T1-weighted sequence and moderate motion degradation of the least motion degraded coronal T2-weighted sequence. Brain: Moderate generalized cerebral atrophy. Mild cerebellar atrophy. 15 mm acute infarct centered within the posterior left frontal lobe white matter with subtle involvement of the overlying cortex (for instance as seen on series 3, images thirty-five and 36) (series 4, image 18). Redemonstrated chronic infarct within the right corona radiata/basal ganglia with ex vacuo dilatation of the frontal horn and body of the right lateral ventricle. Redemonstrated chronic lacunar infarcts  elsewhere within the bilateral cerebral hemispheric white matter, bilateral deep gray nuclei and within the right aspect of the pons. Background advanced patchy and confluent T2 FLAIR hyperintense signal abnormality within the cerebral white matter, nonspecific but compatible with chronic small vessel ischemic disease. Within the limitations of motion degradation, there is no appreciable intracranial mass or extra-axial fluid collection. No midline shift. Vascular: Maintained flow voids within the  proximal large arterial vessels. Skull and upper cervical spine: No focal suspicious marrow lesion. Sinuses/Orbits: No mass or acute finding within the imaged orbits. No significant paranasal sinus disease. IMPRESSION: 1. Significantly motion degraded examination. 2. 15 mm acute cortical/subcortical infarct within the posterior left frontal lobe (MCA territory). 3. Background parenchymal atrophy, advanced chronic small vessel ischemic disease and chronic infarcts, as detailed and unchanged from the prior brain MRI of 07/01/2022. Electronically Signed   By: Jackey Loge D.O.   On: 09/10/2022 14:37   DG Chest Portable 1 View  Result Date: 09/10/2022 CLINICAL DATA:  Mental status change, concern for aspiration EXAM: PORTABLE CHEST 1 VIEW COMPARISON:  06/30/2022 FINDINGS: Left lower lobe retrocardiac collapse/consolidation noted obscuring the left hemidiaphragm compatible with pneumonia. No large effusion. Right lung remains clear. Stable heart size and vascularity. Negative for edema or pneumothorax. Trachea midline. Degenerative changes of the spine. IMPRESSION: Left lower lobe retrocardiac collapse/consolidation. Electronically Signed   By: Judie Petit.  Shick M.D.   On: 09/10/2022 12:23   DG Elbow 2 Views Left  Result Date: 09/10/2022 CLINICAL DATA:  Abrasion, bruising left elbow EXAM: LEFT ELBOW - 2 VIEW COMPARISON:  None Available. FINDINGS: Moderate degenerative arthropathy of the left elbow joint with joint space loss,  sclerosis and bony spurring. Bones are osteopenic. No acute osseous finding or displaced fracture. No large effusion. Extremity subcutaneous edema noted. IMPRESSION: Degenerative changes as above. No acute osseous finding by plain radiography. Electronically Signed   By: Judie Petit.  Shick M.D.   On: 09/10/2022 12:21   CT ANGIO HEAD NECK W WO CM W PERF (CODE STROKE)  Result Date: 09/10/2022 CLINICAL DATA:  Stroke suspected EXAM: CT ANGIOGRAPHY HEAD AND NECK CT PERFUSION BRAIN TECHNIQUE: Multidetector CT imaging of the head and neck was performed using the standard protocol during bolus administration of intravenous contrast. Multiplanar CT image reconstructions and MIPs were obtained to evaluate the vascular anatomy. Carotid stenosis measurements (when applicable) are obtained utilizing NASCET criteria, using the distal internal carotid diameter as the denominator. Multiphase CT imaging of the brain was performed following IV bolus contrast injection. Subsequent parametric perfusion maps were calculated using RAPID software. RADIATION DOSE REDUCTION: This exam was performed according to the departmental dose-optimization program which includes automated exposure control, adjustment of the mA and/or kV according to patient size and/or use of iterative reconstruction technique. CONTRAST:  75mL OMNIPAQUE IOHEXOL 350 MG/ML SOLN COMPARISON:  CTA head/neck 10/13/18 FINDINGS: CT HEAD FINDINGS Please see same-day CT brain for intracranial findings. CTA NECK FINDINGS Aortic arch: Standard branching. Imaged portion shows no evidence of aneurysm or dissection. No significant stenosis of the major arch vessel origins. Right carotid system: There is moderate plaque burden of the right carotid bifurcation that results in minimal overall stenosis of the origin of the right ICA. Atherosclerotic plaque is also seen in the cavernous segment of the right ICA with associated mild stenosis. Left carotid system: No evidence of dissection,  stenosis (50% or greater) or occlusion. There is a small laterally projecting outpouching along the distal petrous segment of the left ICA (series 9, image 112) measuring up to 2 mm, which could represent a small aneurysm. 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 The degree of stenosis and size of the pseudoaneurysm have increased from 2020. there is mild narrowing of the origin of the left  vertebral artery. Skeleton: Mild multilevel degenerative changes. No worrisome lesions. Other neck: Subcentimeter left thyroid nodule. Nonspecific focal soft tissue thickening along the midline submental region (series 11, image 89) measuring 1.0 x 1.2 x 0.6 cm. There is nonspecific prominence of the frenulum of the tongue, likely near the confluence of major salivary ducts (series 9, image 193).These findings are grossly unchanged compared to 10/13/18 Upper chest: Moderate left-sided pleural effusion. Aortic atherosclerotic calcifications. Ground-glass opacity along the anterior aspect of the left upper lobe measuring up to 2.0 x 1.2 cm. Review of the MIP images confirms the above findings CTA HEAD FINDINGS Anterior circulation: No significant stenosis, proximal occlusion, aneurysm, or vascular malformation. Posterior circulation: There is mild focal narrowing in the mid segment of the basilar artery (series 10, image 85). There is moderate focal stenosis of the P1 segment of the right PCA (series 9, image 101). There is moderate stenosis in the distal P2 segment of the left PCA (series 9, image 91). Venous sinuses: As permitted by contrast timing, patent. Anatomic variants: None Review of the MIP images confirms the above findings CT Brain Perfusion Findings: ASPECTS: 10 CBF (<30%) Volume: 0mL Perfusion (Tmax>6.0s) volume: 0mL Mismatch  Volume: 0mL Infarction Location:None IMPRESSION: 1. 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 The degree of stenosis and size of the pseudoaneurysm have increased from 2020. 2. Moderate focal stenosis of the P1 segment of the right PCA and moderate stenosis in the distal P2 segment of the left PCA. These findings are unchanged compared to 2020. 3. 2 mm outpouching along the distal petrous segment of the left ICA, which could represent a small aneurysm. 4. New moderate left-sided pleural effusion and ground-glass opacity along the anterior aspect of the left upper lobe measuring up to 2.0 x 1.2 cm. Although this could be infectious or inflammatory, further evaluation with a dedicated CT of the chest is recommended. Findings were discussed with Dr. Amada Jupiter at 11:11 AM on 09/10/22 Aortic Atherosclerosis (ICD10-I70.0). Electronically Signed   By: Lorenza Cambridge M.D.   On: 09/10/2022 11:55   CT HEAD CODE STROKE WO CONTRAST  Result Date: 09/10/2022 CLINICAL DATA:  Code stroke. Neuro deficit, acute, stroke suspected. EXAM: CT HEAD WITHOUT CONTRAST TECHNIQUE: Contiguous axial images were obtained from the base of the skull through the vertex without intravenous contrast. RADIATION DOSE REDUCTION: This exam was performed according to the departmental dose-optimization program which includes automated exposure control, adjustment of the mA and/or kV according to patient size and/or use of iterative reconstruction technique. COMPARISON:  Brain MRI 07/01/2022. Prior head CT examinations 06/30/2022 and earlier. FINDINGS: Brain: Moderate generalized cerebral atrophy. Mild cerebellar atrophy. Chronic infarct within the right corona radiata/basal ganglia with ex vacuo dilatation of the frontal horn and body of the right lateral ventricle. Redemonstrated chronic lacunar infarcts elsewhere within  the right corona radiata, bilateral deep gray nuclei and within the right aspect of the pons. Background advanced patchy and confluent hypoattenuation within the cerebral white matter, nonspecific but compatible with chronic small vessel ischemic disease. There is no acute intracranial hemorrhage. No demarcated cortical infarct. No extra-axial fluid collection. No evidence of an intracranial mass. No midline shift. Vascular: No hyperdense vessel.  Atherosclerotic calcifications. Skull: No fracture or aggressive osseous lesion. Sinuses/Orbits: No mass or acute finding within the imaged orbits. No significant paranasal sinus disease. ASPECTS [facility] Stroke Program Early CT Score) - Ganglionic  level infarction (caudate, lentiform nuclei, internal capsule, insula, M1-M3 cortex): 7 - Supraganglionic infarction (M4-M6 cortex): 3 Total score (0-10 with 10 being normal): 10 (when discounting chronic infarcts). No acute intracranial abnormality is identified. These results were communicated to Dr. Amada Jupiter at 10:29 amon 1/8/2024by text page via the [facility] messaging system. IMPRESSION: 1. No evidence of acute intracranial abnormality. 2. Parenchymal atrophy, advanced chronic small vessel disease and chronic infarcts, as detailed and not appreciably changed from the prior brain MRI of 07/01/2022. Electronically Signed   By: Jackey Loge D.O.   On: 09/10/2022 10:30    EKG: Independently reviewed.  Sinus rhythm at 93 bpm.  Baseline wander and mild baseline artifact.  Nonspecific T wave flattening.  Low voltage multiple leads.  QTc 596 (unclear if accurate with flattening and artifact).  Assessment/Plan Principal Problem:   Acute CVA (cerebrovascular accident) (HCC) Active Problems:   History of CVA (cerebrovascular accident)   HTN (hypertension)   COPD (chronic obstructive pulmonary disease) (HCC)   Anemia   Cirrhosis (HCC)   PUD (peptic ulcer disease)   Acute CVA > Known history of prior CVA with some  left-sided deficits. > At facility was noted for being quiet and not responding appropriately as well as worsening left-sided deficits. > Initial thought was possibly recrudescence of prior deficits with metabolic etiology being primary.  This may still be some component, however was positive for acute stroke. > CT head showed no acute malady.  CT a of the head and neck showed moderate focal stenosis R V1, R P1, L P2. > MRI positive for acute stroke. - Appreciate neurology recommendations - Allow for permissive HTN (systolic < 220 and diastolic < 120) - Daily aspirin - Continue home atorvastatin - Echocardiogram  - A1C  - Lipid panel  - Tele monitoring  - SLP eval - PT/OT  Left pleural effusion Left lower lobe opacity > Unclear etiology this time.  There is some concern for possible aspiration per report.  Does have a degree of cirrhosis though this is left-sided only so do not suspect hydrothorax. > Possible infectious etiology but does not currently have a white count and was negative for flu COVID and RSV. Will check further viral panel and procalcitonin to help delineate possible viral etiology. > Did receive dose of ceftriaxone and azithromycin in ED. - Hold off on further antibiotics for now - Check full RVP - Procalcitonin - Continue COPD treatment as below  AKI > Creatinine elevated to 2 from baseline of around 1.  Unclear etiology.  Close worsened to 1.3 in November.  - Will give gentle IV fluids overnight. - Trend renal function and electrolytes - Will be getting a dose of albumin as below to help mobilize fluid with his cirrhosis, worsening edema and AKI  COPD - Continue home Spiriva and replace home Advair with formulary Dulera if able to use inhalers (is able to use his right arm but unclear if he will be able to effectively coordinate for inhalation.) - Continue scheduled DuoNebs  Hypertension - Holding home amlodipine, Lasix, lisinopril, metoprolol in setting of  permissive hypertension as above  Anemia > Hemoglobin 8.8 from baseline of close 10.  MCV greater than 100. - Check folate and B12  Cirrhosis > More information in separate chart not yet merged.  There is some history of positive hepatitis C that may be the etiology. > Currently is on Lasix as above as well as lactulose. > Ammonia was 99 in the ED but unclear if  this has any bearing on his altered mentation in the setting of acute stroke. > Worsening edema since October per chart review.  Likely related to current albumin of 1.7.  Have ordered SCDs as below, will give a dose of albumin for fluid mobilization and monitor response.  BNP only borderline elevated at 165. - Continue home lactulose - Holding home Lasix in setting of permissive hypertension and AKI. - IV albumin  DVT prophylaxis: SCDs for now Code Status:   Full Family Communication:  No contacts listed in chart Disposition Plan:   Patient is from:  [employer]  Anticipated DC to:  Likely same as above  Anticipated DC date:  1 to 4 days  Anticipated DC barriers: Only if placement needs change  Consults called:  Neurology Admission status:  Observation, telemetry  Severity of Illness: The appropriate patient status for this patient is OBSERVATION. Observation status is judged to be reasonable and necessary in order to provide the required intensity of service to ensure the patient's safety. The patient's presenting symptoms, physical exam findings, and initial radiographic and laboratory data in the context of their medical condition is felt to place them at decreased risk for further clinical deterioration. Furthermore, it is anticipated that the patient will be medically stable for discharge from the hospital within 2 midnights of admission.    Synetta Fail MD [facility]  How to contact the [facility] Attending or Consulting provider 7A - 7P or covering provider during after hours 7P -7A, for this patient?    Check the care team in [facility] and look for a) attending/consulting [facility] provider listed and b) the [facility] team listed Log into [email or web address] and use [place]'s universal password to access. If you do not have the password, please contact the hospital operator. Locate the [facility] provider you are looking for under [facility] and page to a number that you can be directly reached. If you still have difficulty reaching the provider, please page the [facility] (Director on Call) for the Hospitalists listed on amion for assistance.  09/10/2022, 4:25 PM

## 2022-09-10 NOTE — ED Provider Notes (Signed)
[facility] EMERGENCY DEPARTMENT Provider Note   CSN: [id number] Arrival date & time: 09/10/22  3154     History  Chief Complaint  Patient presents with   Altered Mental Status    Jacob Bass is a 62 y.o. male.  The history is provided by the EMS personnel, medical records and the nursing home (records not initially present). The history is limited by the condition of the patient.  Altered Mental Status Associated symptoms: weakness   Associated symptoms: no abdominal pain, no agitation, no fever, no headaches, no nausea, no palpitations and no vomiting   Neurologic Problem This is a new problem. The current episode started 6 to 12 hours ago. The problem occurs constantly. The problem has not changed since onset.Pertinent negatives include no chest pain, no abdominal pain, no headaches and no shortness of breath. Nothing aggravates the symptoms. Nothing relieves the symptoms. He has tried nothing for the symptoms. The treatment provided no relief.       Home Medications Prior to Admission medications   Not on File      Allergies    Penicillins    Review of Systems   Review of Systems  Unable to perform ROS: Mental status change  Constitutional:  Negative for chills, fatigue and fever.  Eyes:  Negative for visual disturbance.  Respiratory:  Negative for cough, chest tightness, shortness of breath and wheezing.   Cardiovascular:  Positive for leg swelling (diffuse edema). Negative for chest pain and palpitations.  Gastrointestinal:  Negative for abdominal pain, constipation, diarrhea, nausea and vomiting.  Genitourinary:  Negative for dysuria and flank pain.  Musculoskeletal:  Negative for back pain and neck pain.  Skin:  Negative for wound.  Neurological:  Positive for facial asymmetry, speech difficulty and weakness. Negative for headaches.  Psychiatric/Behavioral:  Negative for agitation.   All other systems reviewed and are negative.   Physical  Exam Updated Vital Signs BP 133/80 (BP Location: Right Arm)   Pulse 90   Temp 98.5 F (36.9 C) (Oral)   Resp 18   Ht 5\' 7"  (1.702 m)   Wt 74.8 kg   SpO2 100%   BMI 25.84 kg/m  Physical Exam Vitals and nursing note reviewed.  Constitutional:      General: He is not in acute distress.    Appearance: He is well-developed. He is not ill-appearing, toxic-appearing or diaphoretic.  HENT:     Head: Atraumatic.     Nose: No congestion.     Mouth/Throat:     Mouth: Mucous membranes are moist.     Pharynx: No oropharyngeal exudate or posterior oropharyngeal erythema.  Eyes:     Extraocular Movements: Extraocular movements intact.     Conjunctiva/sclera: Conjunctivae normal.     Pupils: Pupils are equal, round, and reactive to light.  Cardiovascular:     Rate and Rhythm: Normal rate and regular rhythm.     Heart sounds: No murmur heard. Pulmonary:     Effort: Pulmonary effort is normal. No respiratory distress.     Breath sounds: Rhonchi present. No wheezing or rales.  Chest:     Chest wall: No tenderness.  Abdominal:     Palpations: Abdomen is soft.     Tenderness: There is no abdominal tenderness. There is no guarding or rebound.  Musculoskeletal:        General: No swelling.     Cervical back: Neck supple.  Skin:    General: Skin is warm and dry.  Capillary Refill: Capillary refill takes less than 2 seconds.  Neurological:     Mental Status: He is alert.     Cranial Nerves: Dysarthria and facial asymmetry present.     Motor: Abnormal muscle tone present. No seizure activity.     Comments: Left face, left arm, left leg compared to right.  Aphasia and possible dysarthria.  Pupils are symmetric and reactive.  Patient answering some questions yes and no and is alert.  Psychiatric:        Mood and Affect: Mood normal.     ED Results / Procedures / Treatments   Labs (all labs ordered are listed, but only abnormal results are displayed) Labs Reviewed  CBC WITH  DIFFERENTIAL/PLATELET - Abnormal; Notable for the following components:      Result Value   RBC 2.56 (*)    Hemoglobin 8.8 (*)    HCT 25.8 (*)    MCV 100.8 (*)    MCH 34.4 (*)    RDW 17.3 (*)    Platelets 85 (*)    All other components within normal limits  COMPREHENSIVE METABOLIC PANEL - Abnormal; Notable for the following components:   CO2 21 (*)    Creatinine, Ser 2.09 (*)    Calcium 7.8 (*)    Albumin 1.7 (*)    AST 113 (*)    ALT 64 (*)    Total Bilirubin 1.8 (*)    GFR, Estimated 35 (*)    All other components within normal limits  LACTIC ACID, PLASMA - Abnormal; Notable for the following components:   Lactic Acid, Venous 3.5 (*)    All other components within normal limits  LIPASE, BLOOD - Abnormal; Notable for the following components:   Lipase 58 (*)    All other components within normal limits  AMMONIA - Abnormal; Notable for the following components:   Ammonia 99 (*)    All other components within normal limits  TSH - Abnormal; Notable for the following components:   TSH 10.984 (*)    All other components within normal limits  PROTIME-INR - Abnormal; Notable for the following components:   Prothrombin Time 17.5 (*)    INR 1.5 (*)    All other components within normal limits  BRAIN NATRIURETIC PEPTIDE - Abnormal; Notable for the following components:   B Natriuretic Peptide 165.2 (*)    All other components within normal limits  RESP PANEL BY RT-PCR (RSV, FLU A&B, COVID)  RVPGX2  URINE CULTURE  ETHANOL  APTT  LACTIC ACID, PLASMA  URINALYSIS, ROUTINE W REFLEX MICROSCOPIC  RAPID URINE DRUG SCREEN, HOSP PERFORMED  CBG MONITORING, ED  TROPONIN I (HIGH SENSITIVITY)    EKG EKG Interpretation  Date/Time:  Monday September 10 2022 10:02:58 EST Ventricular Rate:  90 PR Interval:  120 QRS Duration: 76 QT Interval:  457 QTC Calculation: 560 R Axis:   16 Text Interpretation: Sinus rhythm Anterior infarct, old Prolonged QT interval when compared to prior,  similar appearnce. No STEMI Confirmed by Theda Belfastegeler, Chris ([id number]) on 09/10/2022 10:40:28 AM  Radiology MR BRAIN WO CONTRAST  Result Date: 09/10/2022 CLINICAL DATA:  Provided history: Neuro deficit, acute, stroke suspected. Slurred speech, left-sided weakness, aphasia. EXAM: MRI HEAD WITHOUT CONTRAST TECHNIQUE: Multiplanar, multiecho pulse sequences of the brain and surrounding structures were obtained without intravenous contrast. COMPARISON:  Noncontrast head CT and CT angiogram head/neck performed earlier today 09/10/2022. Brain MRI 07/01/2022. FINDINGS: Intermittently motion degraded examination, limiting evaluation. Most notably, there is moderate motion degradation of the  least motion degraded axial T2 FLAIR sequence, moderate-to-severe motion degradation of the axial T2 sequence, severe motion degradation of the axial SWI sequence, moderate-to-severe motion degradation of the sagittal T1-weighted sequence, severe motion degradation of the axial T1-weighted sequence and moderate motion degradation of the least motion degraded coronal T2-weighted sequence. Brain: Moderate generalized cerebral atrophy. Mild cerebellar atrophy. 15 mm acute infarct centered within the posterior left frontal lobe white matter with subtle involvement of the overlying cortex (for instance as seen on series 3, images thirty-five and 36) (series 4, image 18). Redemonstrated chronic infarct within the right corona radiata/basal ganglia with ex vacuo dilatation of the frontal horn and body of the right lateral ventricle. Redemonstrated chronic lacunar infarcts elsewhere within the bilateral cerebral hemispheric white matter, bilateral deep gray nuclei and within the right aspect of the pons. Background advanced patchy and confluent T2 FLAIR hyperintense signal abnormality within the cerebral white matter, nonspecific but compatible with chronic small vessel ischemic disease. Within the limitations of motion degradation, there is no  appreciable intracranial mass or extra-axial fluid collection. No midline shift. Vascular: Maintained flow voids within the proximal large arterial vessels. Skull and upper cervical spine: No focal suspicious marrow lesion. Sinuses/Orbits: No mass or acute finding within the imaged orbits. No significant paranasal sinus disease. IMPRESSION: 1. Significantly motion degraded examination. 2. 15 mm acute cortical/subcortical infarct within the posterior left frontal lobe (MCA territory). 3. Background parenchymal atrophy, advanced chronic small vessel ischemic disease and chronic infarcts, as detailed and unchanged from the prior brain MRI of 07/01/2022. Electronically Signed   By: Jackey Loge D.O.   On: 09/10/2022 14:37   DG Chest Portable 1 View  Result Date: 09/10/2022 CLINICAL DATA:  Mental status change, concern for aspiration EXAM: PORTABLE CHEST 1 VIEW COMPARISON:  06/30/2022 FINDINGS: Left lower lobe retrocardiac collapse/consolidation noted obscuring the left hemidiaphragm compatible with pneumonia. No large effusion. Right lung remains clear. Stable heart size and vascularity. Negative for edema or pneumothorax. Trachea midline. Degenerative changes of the spine. IMPRESSION: Left lower lobe retrocardiac collapse/consolidation. Electronically Signed   By: Judie Petit.  Shick M.D.   On: 09/10/2022 12:23   DG Elbow 2 Views Left  Result Date: 09/10/2022 CLINICAL DATA:  Abrasion, bruising left elbow EXAM: LEFT ELBOW - 2 VIEW COMPARISON:  None Available. FINDINGS: Moderate degenerative arthropathy of the left elbow joint with joint space loss, sclerosis and bony spurring. Bones are osteopenic. No acute osseous finding or displaced fracture. No large effusion. Extremity subcutaneous edema noted. IMPRESSION: Degenerative changes as above. No acute osseous finding by plain radiography. Electronically Signed   By: Judie Petit.  Shick M.D.   On: 09/10/2022 12:21   CT ANGIO HEAD NECK W WO CM W PERF (CODE STROKE)  Result Date:  09/10/2022 CLINICAL DATA:  Stroke suspected EXAM: CT ANGIOGRAPHY HEAD AND NECK CT PERFUSION BRAIN TECHNIQUE: Multidetector CT imaging of the head and neck was performed using the standard protocol during bolus administration of intravenous contrast. Multiplanar CT image reconstructions and MIPs were obtained to evaluate the vascular anatomy. Carotid stenosis measurements (when applicable) are obtained utilizing NASCET criteria, using the distal internal carotid diameter as the denominator. Multiphase CT imaging of the brain was performed following IV bolus contrast injection. Subsequent parametric perfusion maps were calculated using RAPID software. RADIATION DOSE REDUCTION: This exam was performed according to the departmental dose-optimization program which includes automated exposure control, adjustment of the mA and/or kV according to patient size and/or use of iterative reconstruction technique. CONTRAST:  24mL OMNIPAQUE IOHEXOL 350  MG/ML SOLN COMPARISON:  CTA head/neck 10/13/18 FINDINGS: CT HEAD FINDINGS Please see same-day CT brain for intracranial findings. CTA NECK FINDINGS Aortic arch: Standard branching. Imaged portion shows no evidence of aneurysm or dissection. No significant stenosis of the major arch vessel origins. Right carotid system: There is moderate plaque burden of the right carotid bifurcation that results in minimal overall stenosis of the origin of the right ICA. Atherosclerotic plaque is also seen in the cavernous segment of the right ICA with associated mild stenosis. Left carotid system: No evidence of dissection, stenosis (50% or greater) or occlusion. There is a small laterally projecting outpouching along the distal petrous segment of the left ICA (series 9, image 112) measuring up to 2 mm, which could represent a small aneurysm. 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 The degree of stenosis and size of the pseudoaneurysm have increased from 2020. there is mild narrowing of the origin of the left vertebral artery. Skeleton: Mild multilevel degenerative changes. No worrisome lesions. Other neck: Subcentimeter left thyroid nodule. Nonspecific focal soft tissue thickening along the midline submental region (series 11, image 89) measuring 1.0 x 1.2 x 0.6 cm. There is nonspecific prominence of the frenulum of the tongue, likely near the confluence of major salivary ducts (series 9, image 193).These findings are grossly unchanged compared to 10/13/18 Upper chest: Moderate left-sided pleural effusion. Aortic atherosclerotic calcifications. Ground-glass opacity along the anterior aspect of the left upper lobe measuring up to 2.0 x 1.2 cm. Review of the MIP images confirms the above findings CTA HEAD FINDINGS Anterior circulation: No significant stenosis, proximal occlusion, aneurysm, or vascular malformation. Posterior circulation: There is mild focal narrowing in the mid segment of the basilar artery (series 10, image 85). There is moderate focal stenosis of the P1 segment of the right PCA (series 9, image 101). There is moderate stenosis in the distal P2 segment of the left PCA (series 9, image 91). Venous sinuses: As permitted by contrast timing, patent. Anatomic variants: None Review of the MIP images confirms the above findings CT Brain Perfusion Findings: ASPECTS: 10 CBF (<30%) Volume: 19mL Perfusion (Tmax>6.0s) volume: 66mL Mismatch Volume: 45mL Infarction Location:None IMPRESSION: 1. 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 The degree of stenosis and size of the pseudoaneurysm have increased from 2020. 2. Moderate focal stenosis of the P1 segment of the right PCA and moderate  stenosis in the distal P2 segment of the left PCA. These findings are unchanged compared to 2020. 3. 2 mm outpouching along the distal petrous segment of the left ICA, which could represent a small aneurysm. 4. New moderate left-sided pleural effusion and ground-glass opacity along the anterior aspect of the left upper lobe measuring up to 2.0 x 1.2 cm. Although this could be infectious or inflammatory, further evaluation with a dedicated CT of the chest is recommended. Findings were discussed with Dr. Amada Jupiter at 11:11 AM on 09/10/22 Aortic Atherosclerosis (ICD10-I70.0). Electronically Signed   By: Lorenza Cambridge M.D.   On: 09/10/2022 11:55   CT HEAD CODE STROKE WO CONTRAST  Result Date: 09/10/2022 CLINICAL DATA:  Code stroke. Neuro deficit, acute, stroke suspected. EXAM: CT HEAD WITHOUT CONTRAST TECHNIQUE: Contiguous axial images were obtained from the  base of the skull through the vertex without intravenous contrast. RADIATION DOSE REDUCTION: This exam was performed according to the departmental dose-optimization program which includes automated exposure control, adjustment of the mA and/or kV according to patient size and/or use of iterative reconstruction technique. COMPARISON:  Brain MRI 07/01/2022. Prior head CT examinations 06/30/2022 and earlier. FINDINGS: Brain: Moderate generalized cerebral atrophy. Mild cerebellar atrophy. Chronic infarct within the right corona radiata/basal ganglia with ex vacuo dilatation of the frontal horn and body of the right lateral ventricle. Redemonstrated chronic lacunar infarcts elsewhere within the right corona radiata, bilateral deep gray nuclei and within the right aspect of the pons. Background advanced patchy and confluent hypoattenuation within the cerebral white matter, nonspecific but compatible with chronic small vessel ischemic disease. There is no acute intracranial hemorrhage. No demarcated cortical infarct. No extra-axial fluid collection. No evidence of an  intracranial mass. No midline shift. Vascular: No hyperdense vessel.  Atherosclerotic calcifications. Skull: No fracture or aggressive osseous lesion. Sinuses/Orbits: No mass or acute finding within the imaged orbits. No significant paranasal sinus disease. ASPECTS [facility] Stroke Program Early CT Score) - Ganglionic level infarction (caudate, lentiform nuclei, internal capsule, insula, M1-M3 cortex): 7 - Supraganglionic infarction (M4-M6 cortex): 3 Total score (0-10 with 10 being normal): 10 (when discounting chronic infarcts). No acute intracranial abnormality is identified. These results were communicated to Dr. Amada Jupiter at 10:29 amon 1/8/2024by text page via the [employer] messaging system. IMPRESSION: 1. No evidence of acute intracranial abnormality. 2. Parenchymal atrophy, advanced chronic small vessel disease and chronic infarcts, as detailed and not appreciably changed from the prior brain MRI of 07/01/2022. Electronically Signed   By: Jackey Loge D.O.   On: 09/10/2022 10:30    Procedures Procedures    CRITICAL CARE Performed by: Canary Brim Cathrine Krizan Total critical care time: 35 minutes Critical care time was exclusive of separately billable procedures and treating other patients. Critical care was necessary to treat or prevent imminent or life-threatening deterioration. Critical care was time spent personally by me on the following activities: development of treatment plan with patient and/or surrogate as well as nursing, discussions with consultants, evaluation of patient's response to treatment, examination of patient, obtaining history from patient or surrogate, ordering and performing treatments and interventions, ordering and review of laboratory studies, ordering and review of radiographic studies, pulse oximetry and re-evaluation of patient's condition.  Medications Ordered in ED Medications  azithromycin (ZITHROMAX) 500 mg in sodium chloride 0.9 % 250 mL IVPB (500 mg Intravenous New  Bag/Given 09/10/22 1505)  iohexol (OMNIPAQUE) 350 MG/ML injection 75 mL (75 mLs Intravenous Contrast Given 09/10/22 1040)  cefTRIAXone (ROCEPHIN) 1 g in sodium chloride 0.9 % 100 mL IVPB (1 g Intravenous New Bag/Given 09/10/22 1503)    ED Course/ Medical Decision Making/ A&P                           Medical Decision Making Amount and/or Complexity of Data Reviewed Labs: ordered. Radiology: ordered.  Risk Decision regarding hospitalization.    Jacob Bass is a 62 y.o. male With a past medical history significant for stroke, COPD, cirrhosis, and gastric ulcer reportedly presents via EMS for altered mental status.  According to EMS report to nursing, there was question if patient had choked on something and was having some difficulty breathing however when asked the patient he seemed to say no when asked about those problems.  His vital signs on arrival showed no tachycardia, tachypnea, fever, or hypoxia.  Blood pressure was in the 270J systolic.  What was concerning on initial evaluation was the fact that patient was not answering questions normally.  When asked if this was new patient said yes.  I called the facility, [facility], and they say that the patient was last normal at approximately 10:51 PM last night when someone checked on him after medications.  They state that he normally is very talkative without any difficulty with speech and normally tells jokes.  They state this morning he was not talking and may have had worsened weakness on his left side.  They sent him with concern for possible stroke however from report from EMS and nursing there did not seem to be concern for stroke.  They also report patient has not had any recent fevers, chills, chest pain, cough, nausea, vomiting, constipation, diarrhea, or urinary changes to their knowledge.  They report he has not had COVID recently.  EMS reported nursing that patient may have had COVID however the facility said that there was  another patient they were concerned about COVID but not this patient.  On my initial exam, patient has weakness in left face, left arm, left leg with some dried food on his left face where it ruled out.  He was having significant aphasia and possible dysarthria that he had difficult to communicate.  This seems to be very different from his baseline.  Left facial droop, left arm weakness, and left leg weakness compared to the right.  As he has unilateral weakness worsened and speech difficulty will activate as a code stroke being Van positive.  Patient taken to CT scanner for stroke workup.  Otherwise we will still workup patient for the possibility of aspiration as EMS reports there was food next to him and he does have some dried food at the left side of his mouth.  On my exam patient was having some noisy breathing but was not having focal wheezing on my exam.  There were some possible rhonchi.  Otherwise he is grossly edematous in his extremities but chest and abdomen did not appear to be tender.  No evidence of acute head trauma.  He does have a abrasion to his left elbow with some swelling.  Neurology recommends MRI and reevaluation and then treating medically however I do think patient will need admission when workup is completed.         1:22 PM Workup continues to return.  Initially neurology were concerned about recrudescence of previous stroke in the setting of something metabolic or infectious going on.  The CT of his neck did show evidence of groundglass opacity and effusion on the left side which could be pneumonia.  Lactic acid elevated 3.5.  He is COVID, flu, and RSV negative.  He does not know white count but his lactic acid was elevated at 3.5.  Given the altered mental status and some neurology complaints, I am somewhat concerned this could be triggered by a pneumonia if his MRI does not show acute stroke.  Lipase slightly elevated but is not having tenderness on his abdomen, doubt  pancreatitis.  His BNP was elevated slightly at 165 but does not appear to be critically so.  Troponin negative, will trend.  His ammonia was elevated at 99, unclear if this is a degree of hepatic encephalopathy contribute to some of the symptoms.  Will go ahead and give antibiotics for possible pneumonia given the convincing findings on his CT and chest x-ray and will wait for results  of MRI.  Anticipate admission for further management of pneumonia as well as further neurologic management in the setting of this new aphasia and worsened dysarthria and worsening left-sided weakness.  1:30 PM Pharmacy was able to look at other chart and it appears he has taken cephalosporins without difficulty.  Will order Rocephin azithromycin other recommendation and will wait for MRI prior to admission for mental status changes and stroke symptoms in the setting of PNA.  MRI was reviewed by me and does appear to show acute stroke.  Radiologist read also confirms acute stroke.  Suspect this is the cause of his aphasia.  Patient will be admitted for acute stroke and suspected pneumonia        Final Clinical Impression(s) / ED Diagnoses Final diagnoses:  Cerebrovascular accident (CVA), unspecified mechanism (HCC)  Aphasia  Pneumonia due to infectious organism, unspecified laterality, unspecified part of lung  Pleural effusion    Clinical Impression: 1. Cerebrovascular accident (CVA), unspecified mechanism (HCC)   2. Aphasia   3. Pneumonia due to infectious organism, unspecified laterality, unspecified part of lung   4. Pleural effusion     Disposition: Admit  This note was prepared with assistance of Dragon voice recognition software. Occasional wrong-word or sound-a-like substitutions may have occurred due to the inherent limitations of voice recognition software.     Draken Farrior, Canary Brimhristopher J, MD 09/10/22 1600

## 2022-09-10 NOTE — ED Notes (Signed)
Pt back from ct with rn and stroke RN, plan for q3 nihss and MRI, pt awake and moving extremities, pt remains on monitor.

## 2022-09-10 NOTE — ED Notes (Signed)
PT at bedside working with patient.

## 2022-09-11 ENCOUNTER — Observation Stay (HOSPITAL_COMMUNITY): Payer: Medicaid Other

## 2022-09-11 DIAGNOSIS — R1310 Dysphagia, unspecified: Secondary | ICD-10-CM | POA: Diagnosis not present

## 2022-09-11 DIAGNOSIS — K921 Melena: Secondary | ICD-10-CM | POA: Diagnosis not present

## 2022-09-11 DIAGNOSIS — I10 Essential (primary) hypertension: Secondary | ICD-10-CM | POA: Diagnosis present

## 2022-09-11 DIAGNOSIS — R1312 Dysphagia, oropharyngeal phase: Secondary | ICD-10-CM | POA: Diagnosis not present

## 2022-09-11 DIAGNOSIS — E871 Hypo-osmolality and hyponatremia: Secondary | ICD-10-CM | POA: Diagnosis not present

## 2022-09-11 DIAGNOSIS — I6389 Other cerebral infarction: Secondary | ICD-10-CM

## 2022-09-11 DIAGNOSIS — D539 Nutritional anemia, unspecified: Secondary | ICD-10-CM | POA: Diagnosis present

## 2022-09-11 DIAGNOSIS — Z8673 Personal history of transient ischemic attack (TIA), and cerebral infarction without residual deficits: Secondary | ICD-10-CM | POA: Diagnosis not present

## 2022-09-11 DIAGNOSIS — D689 Coagulation defect, unspecified: Secondary | ICD-10-CM | POA: Diagnosis present

## 2022-09-11 DIAGNOSIS — K920 Hematemesis: Secondary | ICD-10-CM | POA: Diagnosis not present

## 2022-09-11 DIAGNOSIS — B182 Chronic viral hepatitis C: Secondary | ICD-10-CM | POA: Diagnosis not present

## 2022-09-11 DIAGNOSIS — J90 Pleural effusion, not elsewhere classified: Secondary | ICD-10-CM | POA: Diagnosis present

## 2022-09-11 DIAGNOSIS — E876 Hypokalemia: Secondary | ICD-10-CM | POA: Diagnosis not present

## 2022-09-11 DIAGNOSIS — I639 Cerebral infarction, unspecified: Secondary | ICD-10-CM | POA: Diagnosis present

## 2022-09-11 DIAGNOSIS — Z7189 Other specified counseling: Secondary | ICD-10-CM | POA: Diagnosis not present

## 2022-09-11 DIAGNOSIS — E440 Moderate protein-calorie malnutrition: Secondary | ICD-10-CM | POA: Diagnosis present

## 2022-09-11 DIAGNOSIS — R188 Other ascites: Secondary | ICD-10-CM | POA: Diagnosis not present

## 2022-09-11 DIAGNOSIS — J690 Pneumonitis due to inhalation of food and vomit: Secondary | ICD-10-CM | POA: Diagnosis present

## 2022-09-11 DIAGNOSIS — R4182 Altered mental status, unspecified: Secondary | ICD-10-CM | POA: Diagnosis not present

## 2022-09-11 DIAGNOSIS — J449 Chronic obstructive pulmonary disease, unspecified: Secondary | ICD-10-CM | POA: Diagnosis present

## 2022-09-11 DIAGNOSIS — D649 Anemia, unspecified: Secondary | ICD-10-CM | POA: Diagnosis not present

## 2022-09-11 DIAGNOSIS — K7460 Unspecified cirrhosis of liver: Secondary | ICD-10-CM | POA: Diagnosis not present

## 2022-09-11 DIAGNOSIS — K7682 Hepatic encephalopathy: Secondary | ICD-10-CM | POA: Diagnosis present

## 2022-09-11 DIAGNOSIS — Z1152 Encounter for screening for COVID-19: Secondary | ICD-10-CM | POA: Diagnosis not present

## 2022-09-11 DIAGNOSIS — F0390 Unspecified dementia without behavioral disturbance: Secondary | ICD-10-CM | POA: Diagnosis present

## 2022-09-11 DIAGNOSIS — E870 Hyperosmolality and hypernatremia: Secondary | ICD-10-CM | POA: Diagnosis present

## 2022-09-11 DIAGNOSIS — E8809 Other disorders of plasma-protein metabolism, not elsewhere classified: Secondary | ICD-10-CM | POA: Diagnosis present

## 2022-09-11 DIAGNOSIS — F1010 Alcohol abuse, uncomplicated: Secondary | ICD-10-CM | POA: Diagnosis present

## 2022-09-11 DIAGNOSIS — E872 Acidosis, unspecified: Secondary | ICD-10-CM | POA: Diagnosis present

## 2022-09-11 DIAGNOSIS — G8194 Hemiplegia, unspecified affecting left nondominant side: Secondary | ICD-10-CM | POA: Diagnosis present

## 2022-09-11 DIAGNOSIS — R4701 Aphasia: Secondary | ICD-10-CM | POA: Diagnosis not present

## 2022-09-11 DIAGNOSIS — N179 Acute kidney failure, unspecified: Secondary | ICD-10-CM | POA: Diagnosis present

## 2022-09-11 DIAGNOSIS — D6959 Other secondary thrombocytopenia: Secondary | ICD-10-CM | POA: Diagnosis present

## 2022-09-11 DIAGNOSIS — L89626 Pressure-induced deep tissue damage of left heel: Secondary | ICD-10-CM | POA: Diagnosis not present

## 2022-09-11 DIAGNOSIS — I63512 Cerebral infarction due to unspecified occlusion or stenosis of left middle cerebral artery: Secondary | ICD-10-CM | POA: Diagnosis present

## 2022-09-11 DIAGNOSIS — K7031 Alcoholic cirrhosis of liver with ascites: Secondary | ICD-10-CM | POA: Diagnosis present

## 2022-09-11 DIAGNOSIS — R195 Other fecal abnormalities: Secondary | ICD-10-CM | POA: Diagnosis not present

## 2022-09-11 DIAGNOSIS — I69391 Dysphagia following cerebral infarction: Secondary | ICD-10-CM | POA: Diagnosis not present

## 2022-09-11 HISTORY — DX: Cerebral infarction, unspecified: I63.9

## 2022-09-11 LAB — CBC
HCT: 25.8 % — ABNORMAL LOW (ref 39.0–52.0)
Hemoglobin: 8.5 g/dL — ABNORMAL LOW (ref 13.0–17.0)
MCH: 32.9 pg (ref 26.0–34.0)
MCHC: 32.9 g/dL (ref 30.0–36.0)
MCV: 100 fL (ref 80.0–100.0)
Platelets: 83 10*3/uL — ABNORMAL LOW (ref 150–400)
RBC: 2.58 MIL/uL — ABNORMAL LOW (ref 4.22–5.81)
RDW: 17.7 % — ABNORMAL HIGH (ref 11.5–15.5)
WBC: 5.1 10*3/uL (ref 4.0–10.5)
nRBC: 0 % (ref 0.0–0.2)

## 2022-09-11 LAB — ECHOCARDIOGRAM COMPLETE
Area-P 1/2: 3.11 cm2
Height: 67 in
S' Lateral: 2.5 cm
Weight: 2640 oz

## 2022-09-11 LAB — BASIC METABOLIC PANEL
Anion gap: 10 (ref 5–15)
BUN: 22 mg/dL (ref 8–23)
CO2: 21 mmol/L — ABNORMAL LOW (ref 22–32)
Calcium: 8.1 mg/dL — ABNORMAL LOW (ref 8.9–10.3)
Chloride: 111 mmol/L (ref 98–111)
Creatinine, Ser: 1.77 mg/dL — ABNORMAL HIGH (ref 0.61–1.24)
GFR, Estimated: 43 mL/min — ABNORMAL LOW (ref >60)
Glucose, Bld: 52 mg/dL — ABNORMAL LOW (ref 70–99)
Potassium: 3.8 mmol/L (ref 3.5–5.1)
Sodium: 142 mmol/L (ref 135–145)

## 2022-09-11 LAB — PROCALCITONIN: Procalcitonin: 0.12 ng/mL

## 2022-09-11 LAB — LIPID PANEL
Cholesterol: 111 mg/dL (ref 0–200)
HDL: 24 mg/dL — ABNORMAL LOW (ref >40)
LDL Cholesterol: 74 mg/dL (ref 0–99)
Total CHOL/HDL Ratio: 4.6 RATIO
Triglycerides: 64 mg/dL (ref <150)
VLDL: 13 mg/dL (ref 0–40)

## 2022-09-11 LAB — RAPID URINE DRUG SCREEN, HOSP PERFORMED
Amphetamines: NOT DETECTED
Barbiturates: NOT DETECTED
Benzodiazepines: NOT DETECTED
Cocaine: NOT DETECTED
Opiates: NOT DETECTED
Tetrahydrocannabinol: NOT DETECTED

## 2022-09-11 LAB — URINE CULTURE

## 2022-09-11 LAB — HEMOGLOBIN A1C
Hgb A1c MFr Bld: 4.5 % — ABNORMAL LOW (ref 4.8–5.6)
Mean Plasma Glucose: 82.45 mg/dL

## 2022-09-11 LAB — GLUCOSE, CAPILLARY: Glucose-Capillary: 83 mg/dL (ref 70–99)

## 2022-09-11 MED ORDER — HEPARIN SODIUM (PORCINE) 5000 UNIT/ML IJ SOLN
5000.0000 [IU] | Freq: Three times a day (TID) | INTRAMUSCULAR | Status: DC
  Administered 2022-09-11 – 2022-09-25 (×40): 5000 [IU] via SUBCUTANEOUS
  Filled 2022-09-11 (×43): qty 1

## 2022-09-11 MED ORDER — DEXTROSE 50 % IV SOLN
INTRAVENOUS | Status: AC
  Administered 2022-09-11: 50 mL
  Filled 2022-09-11: qty 50

## 2022-09-11 NOTE — Progress Notes (Signed)
  Echocardiogram Echocardiogram Transesophageal has been performed.  Jacob Bass M 09/11/2022, 2:39 PM

## 2022-09-11 NOTE — ED Notes (Signed)
Pt back from swallow study and Echo complete.

## 2022-09-11 NOTE — ED Notes (Signed)
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 Slurred speech, left-sided weakness, aphasia. EXAM: MRI HEAD WITHOUT CONTRAST TECHNIQUE: Multiplanar, multiecho pulse sequences of the brain and surrounding structures were obtained without intravenous contrast. COMPARISON:  Noncontrast head CT and CT angiogram head/neck performed earlier today  09/10/2022. Brain MRI 07/01/2022. FINDINGS: Intermittently motion degraded examination, limiting evaluation. Most notably, there is moderate motion degradation of the least motion degraded axial T2 FLAIR sequence, moderate-to-severe motion degradation of the axial T2 sequence, severe motion degradation of the axial SWI sequence, moderate-to-severe motion degradation of the sagittal T1-weighted sequence, severe motion degradation of the axial T1-weighted sequence and moderate motion degradation of the least motion degraded coronal T2-weighted sequence. Brain: Moderate generalized cerebral atrophy. Mild cerebellar atrophy. 15 mm acute infarct centered within the posterior left frontal lobe white matter with subtle involvement of the overlying cortex (for instance as seen on series 3, images thirty-five and 36) (series 4, image 18). Redemonstrated chronic infarct within the right corona radiata/basal ganglia with ex vacuo dilatation of the frontal horn and body of the right lateral ventricle. Redemonstrated chronic lacunar infarcts elsewhere within the bilateral cerebral hemispheric white matter, bilateral deep gray nuclei and within the right aspect of the pons. Background advanced patchy and confluent T2 FLAIR hyperintense signal abnormality within the cerebral white matter, nonspecific but compatible with chronic small vessel ischemic disease. Within the limitations of motion degradation, there is no appreciable intracranial mass or extra-axial fluid collection. No midline shift. Vascular: Maintained flow voids within the proximal large arterial vessels. Skull and upper cervical spine: No focal suspicious marrow lesion. Sinuses/Orbits: No mass or acute finding within the imaged orbits. No significant paranasal sinus disease. IMPRESSION: 1. Significantly motion degraded examination. 2. 15 mm acute cortical/subcortical infarct within the posterior left frontal lobe (MCA territory). 3. Background parenchymal atrophy,  advanced chronic small vessel ischemic disease and chronic infarcts, as detailed and unchanged from the prior brain MRI of 07/01/2022. Electronically Signed   By: Jackey Loge  D.O.   On: 09/10/2022 14:37   DG Chest Portable 1 View  Result Date: 09/10/2022 CLINICAL DATA:  Mental status change, concern for aspiration EXAM: PORTABLE CHEST 1 VIEW COMPARISON:  06/30/2022 FINDINGS: Left lower lobe retrocardiac collapse/consolidation noted obscuring the left hemidiaphragm compatible with pneumonia. No large effusion. Right lung remains clear. Stable heart size and vascularity. Negative for edema or pneumothorax. Trachea midline. Degenerative changes of the spine. IMPRESSION: Left lower lobe retrocardiac collapse/consolidation. Electronically Signed   By: Judie Petit.  Shick M.D.   On: 09/10/2022 12:23   DG Elbow 2 Views Left  Result Date: 09/10/2022 CLINICAL DATA:  Abrasion, bruising left elbow EXAM: LEFT ELBOW - 2 VIEW COMPARISON:  None Available. FINDINGS: Moderate degenerative arthropathy of the left elbow joint with joint space loss, sclerosis and bony spurring. Bones are osteopenic. No acute osseous finding or displaced fracture. No large effusion. Extremity subcutaneous edema noted. IMPRESSION: Degenerative changes as above. No acute osseous finding by plain radiography. Electronically Signed   By: Judie Petit.  Shick M.D.   On: 09/10/2022 12:21   CT ANGIO HEAD NECK W WO CM W PERF (CODE STROKE)  Result Date: 09/10/2022 CLINICAL DATA:  Stroke suspected EXAM: CT ANGIOGRAPHY HEAD AND NECK CT PERFUSION BRAIN TECHNIQUE: Multidetector CT imaging of the head and neck was performed using the standard protocol during bolus administration of intravenous contrast. Multiplanar CT image reconstructions and MIPs were obtained to evaluate the vascular anatomy. Carotid stenosis measurements (when applicable) are obtained utilizing NASCET criteria, using the distal internal carotid diameter as the denominator. Multiphase CT imaging of the brain  was performed following IV bolus contrast injection. Subsequent parametric perfusion maps were calculated using RAPID software. RADIATION DOSE REDUCTION: This exam was performed according to the departmental dose-optimization program which includes automated exposure control, adjustment of the mA and/or kV according to patient size and/or use of iterative reconstruction technique. CONTRAST:  75mL OMNIPAQUE IOHEXOL 350 MG/ML SOLN COMPARISON:  CTA head/neck 10/13/18 FINDINGS: CT HEAD FINDINGS Please see same-day CT brain for intracranial findings. CTA NECK FINDINGS Aortic arch: Standard branching. Imaged portion shows no evidence of aneurysm or dissection. No significant stenosis of the major arch vessel origins. Right carotid system: There is moderate plaque burden of the right carotid bifurcation that results in minimal overall stenosis of the origin of the right ICA. Atherosclerotic plaque is also seen in the cavernous segment of the right ICA with associated mild stenosis. Left carotid system: No evidence of dissection, stenosis (50% or greater) or occlusion. There is a small laterally projecting outpouching along the distal petrous segment of the left ICA (series 9, image 112) measuring up to 2 mm, which could represent a small aneurysm. 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 The degree of stenosis and size of the pseudoaneurysm have increased from 2020. there is mild narrowing of the origin of the left vertebral artery. Skeleton: Mild multilevel degenerative changes. No worrisome lesions. Other neck: Subcentimeter left thyroid nodule. Nonspecific focal soft tissue thickening along the midline submental region (series 11, image 89) measuring 1.0 x 1.2 x 0.6 cm. There is nonspecific prominence of the frenulum of the tongue,  likely near the confluence of major salivary ducts (series 9, image 193).These findings are grossly unchanged compared to 10/13/18 Upper chest: Moderate left-sided pleural effusion. Aortic atherosclerotic calcifications. Ground-glass opacity along the anterior aspect  of the left upper lobe measuring up to 2.0 x 1.2 cm. Review of the MIP images confirms the above findings CTA HEAD FINDINGS Anterior circulation: No significant stenosis, proximal occlusion, aneurysm, or vascular malformation. Posterior circulation: There is mild focal narrowing in the mid segment of the basilar artery (series 10, image 85). There is moderate focal stenosis of the P1 segment of the right PCA (series 9, image 101). There is moderate stenosis in the distal P2 segment of the left PCA (series 9, image 91). Venous sinuses: As permitted by contrast timing, patent. Anatomic variants: None Review of the MIP images confirms the above findings CT Brain Perfusion Findings: ASPECTS: 10 CBF (<30%) Volume: 0mL Perfusion (Tmax>6.0s) volume: 0mL Mismatch Volume: 0mL Infarction Location:None IMPRESSION: 1. 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 The degree of stenosis and size of the pseudoaneurysm have increased from 2020. 2. Moderate focal stenosis of the P1 segment of the right PCA and moderate stenosis in the distal P2 segment of the left PCA. These findings are unchanged compared to 2020. 3. 2 mm outpouching along the distal petrous segment of the left ICA, which could represent a small aneurysm. 4. New moderate left-sided pleural effusion and ground-glass opacity along the anterior aspect of the left upper lobe measuring up to 2.0 x 1.2 cm. Although this could be infectious or inflammatory, further evaluation with a dedicated CT of the chest is recommended. Findings were discussed with Dr. Amada Jupiter at 11:11 AM  on 09/10/22 Aortic Atherosclerosis (ICD10-I70.0). Electronically Signed   By: Lorenza Cambridge M.D.   On: 09/10/2022 11:55   CT HEAD CODE STROKE WO CONTRAST  Result Date: 09/10/2022 CLINICAL DATA:  Code stroke. Neuro deficit, acute, stroke suspected. EXAM: CT HEAD WITHOUT CONTRAST TECHNIQUE: Contiguous axial images were obtained from the base of the skull through the vertex without intravenous contrast. RADIATION DOSE REDUCTION: This exam was performed according to the departmental dose-optimization program which includes automated exposure control, adjustment of the mA and/or kV according to patient size and/or use of iterative reconstruction technique. COMPARISON:  Brain MRI 07/01/2022. Prior head CT examinations 06/30/2022 and earlier. FINDINGS: Brain: Moderate generalized cerebral atrophy. Mild cerebellar atrophy. Chronic infarct within the right corona radiata/basal ganglia with ex vacuo dilatation of the frontal horn and body of the right lateral ventricle. Redemonstrated chronic lacunar infarcts elsewhere within the right corona radiata, bilateral deep gray nuclei and within the right aspect of the pons. Background advanced patchy and confluent hypoattenuation within the cerebral white matter, nonspecific but compatible with chronic small vessel ischemic disease. There is no acute intracranial hemorrhage. No demarcated cortical infarct. No extra-axial fluid collection. No evidence of an intracranial mass. No midline shift. Vascular: No hyperdense vessel.  Atherosclerotic calcifications. Skull: No fracture or aggressive osseous lesion. Sinuses/Orbits: No mass or acute finding within the imaged orbits. No significant paranasal sinus disease. ASPECTS [facility] Stroke Program Early CT Score) - Ganglionic level infarction (caudate, lentiform nuclei, internal capsule, insula, M1-M3 cortex): 7 - Supraganglionic infarction (M4-M6 cortex): 3 Total score (0-10 with 10 being normal): 10 (when discounting chronic infarcts).  No acute intracranial abnormality is identified. These results were communicated to Dr. Amada Jupiter at 10:29 amon 1/8/2024by text page via the [facility] messaging system. IMPRESSION: 1. No evidence of acute intracranial abnormality. 2. Parenchymal atrophy, advanced chronic small vessel disease and chronic infarcts, as detailed and not appreciably changed from the prior  brain MRI of 07/01/2022. Electronically Signed   By: Jackey Loge D.O.   On: 09/10/2022 10:30    Pending Labs Unresulted Labs (From admission, onward)     Start     Ordered   09/11/22 1347  CBC  Once,   R        09/11/22 1346   09/11/22 1345  Basic metabolic panel  Once,   R        09/11/22 1345   09/11/22 0500  Procalcitonin  Daily,   R      09/10/22 1625   09/10/22 0957  Lactic acid, plasma  Now then every 2 hours,   R      09/10/22 0959            Vitals/Pain Today's Vitals   09/11/22 1000 09/11/22 1030 09/11/22 1050 09/11/22 1100  BP: 131/75 127/72  115/77  Pulse: 95 88 84 87  Resp:   14 18  Temp:      TempSrc:      SpO2: 100% 100% 99% 100%  Weight:      Height:      PainSc:        Isolation Precautions Droplet precaution  Medications Medications  tamsulosin (FLOMAX) capsule 0.4 mg (0.4 mg Oral Not Given 09/11/22 1036)  acetaminophen (TYLENOL) tablet 650 mg (has no administration in time range)    Or  acetaminophen (TYLENOL) 160 MG/5ML solution 650 mg (has no administration in time range)    Or  acetaminophen (TYLENOL) suppository 650 mg (has no administration in time range)  senna-docusate (Senokot-S) tablet 1 tablet (has no administration in time range)  aspirin EC tablet 81 mg (81 mg Oral Not Given 09/11/22 1035)  pantoprazole (PROTONIX) EC tablet 40 mg (40 mg Oral Not Given 09/11/22 1036)  lactulose (CHRONULAC) 10 GM/15ML solution 30 g (30 g Oral Not Given 09/11/22 1036)  ipratropium-albuterol (DUONEB) 0.5-2.5 (3) MG/3ML nebulizer solution 3 mL (3 mLs Nebulization Given 09/11/22 1229)  umeclidinium bromide  (INCRUSE ELLIPTA) 62.5 MCG/ACT 1 puff (1 puff Inhalation Not Given 09/11/22 1218)  mometasone-formoterol (DULERA) 200-5 MCG/ACT inhaler 2 puff (2 puffs Inhalation Not Given 09/11/22 1218)  iohexol (OMNIPAQUE) 350 MG/ML injection 75 mL (75 mLs Intravenous Contrast Given 09/10/22 1040)  cefTRIAXone (ROCEPHIN) 1 g in sodium chloride 0.9 % 100 mL IVPB (0 g Intravenous Stopped 09/10/22 1605)  azithromycin (ZITHROMAX) 500 mg in sodium chloride 0.9 % 250 mL IVPB (0 mg Intravenous Stopped 09/10/22 1605)   stroke: early stages of recovery book ( Does not apply Given 09/11/22 1036)  lactulose (CHRONULAC) enema 200 gm (300 mLs Rectal Given 09/10/22 2307)  albumin human 25 % solution 12.5 g (0 g Intravenous Stopped 09/10/22 1912)  aspirin suppository 300 mg (300 mg Rectal Given 09/10/22 2008)    Mobility non-ambulatory High fall risk   Focused Assessments    R Recommendations: See Admitting Provider Note  Report given to:   Additional Notes: Pt came into for AMS, CT found CVA, had residual effects from previous stroke, Failed swallow screening and SLP fail, staying NPO, no vocalization just grunts an head nods, left side weakness unable to move, has left sided sensation, able to move right arm and leg but non purposeful, male cath placed, bilateral legs piting edema +4

## 2022-09-11 NOTE — Evaluation (Signed)
Occupational Therapy Evaluation Patient Details Name: Jacob Bass MRN: [id number] DOB: 1960-11-27 Today's Date: 09/11/2022   History of Present Illness 62 y.o. male presents to [place] [facility] on 09/10/2022 with AMS and L weakness from [facility]. 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 PMH includes CVA, COPD, cirrhosis, gastric ulcer.   Clinical Impression   PTA, pt from SNF though unsure of prior functional baseline as pt is a poor historian. Pt presents now with deficits in strength, coordination, speech, balance and questionable L sided inattention. Pt with impaired L UE ROM with spasticity noted though unsure if these deficits are new. Pt requires Mod-Total A for rolling, Max A for UB ADLs and Total A for LB ADLs bed level. Educated pt re: positioning of LUE, basic PROM/functional exercises. Will continue to follow acutely and recommend therapy follow up at SNF as appropriate.      Recommendations for follow up therapy are one component of a multi-disciplinary discharge planning process, led by the attending physician.  Recommendations may be updated based on patient status, additional functional criteria and insurance authorization.   Follow Up Recommendations  Skilled nursing-short term rehab (<3 hours/day) (back to SNF)     Assistance Recommended at Discharge Frequent or constant Supervision/Assistance  Patient can return home with the following Two people to help with walking and/or transfers;Two people to help with bathing/dressing/bathroom    Functional Status Assessment  Patient has had a recent decline in their functional status and demonstrates the ability to make significant improvements in function in a reasonable and predictable amount of time.  Equipment Recommendations  None recommended by OT    Recommendations for Other Services       Precautions / Restrictions Precautions Precautions: Fall Restrictions Weight Bearing Restrictions: No      Mobility Bed  Mobility Overal bed mobility: Needs Assistance Bed Mobility: Rolling Rolling: Mod assist, Total assist         General bed mobility comments: Mod A to roll to L side with pt reaching RUE to side rail and Total A for rolling to R side with pt continuing to attempt to reach with RUE and noted L head turn/gaze. Max A  for long sitting on stretcher with R UE on stretcher rail    Transfers                   General transfer comment: deferred without +2 assist      Balance                                           ADL either performed or assessed with clinical judgement   ADL Overall ADL's : Needs assistance/impaired Eating/Feeding: NPO   Grooming: Moderate assistance;Bed level   Upper Body Bathing: Maximal assistance;Bed level   Lower Body Bathing: Total assistance;Bed level   Upper Body Dressing : Maximal assistance;Bed level   Lower Body Dressing: Total assistance;Bed level       Toileting- Clothing Manipulation and Hygiene: Total assistance;Bed level         General ADL Comments: Signifcant BLE swelling, L UE impairments from prior CVA and noted crackling sounds in lungs. significant assist for ADLs bed level     Vision Ability to See in Adequate Light: 1 Impaired Patient Visual Report: Other (comment) (to be further assessed) Vision Assessment?: Vision impaired- to be further tested in functional context Additional Comments:  L eye appearing cloudy though pt denies any vision issues. pt with some L inattention during assessment but able to scan to therapist on L side when cued     Perception     Praxis      Pertinent Vitals/Pain Pain Assessment Pain Assessment: No/denies pain     Hand Dominance Right   Extremity/Trunk Assessment Upper Extremity Assessment Upper Extremity Assessment: LUE deficits/detail LUE Deficits / Details: decorticate positioning, profound weakness and increased tone noted with PROM stretching (hand/elbow  primarily). able to reach towards face but unable to touch face. when cued to position UE in a certain manner, pt automatically reaching with R UE to help LUE LUE Coordination: decreased fine motor;decreased gross motor   Lower Extremity Assessment Lower Extremity Assessment: Defer to PT evaluation   Cervical / Trunk Assessment Cervical / Trunk Assessment: Kyphotic   Communication Communication Communication: Expressive difficulties;Other (comment) (dysarthric)   Cognition Arousal/Alertness: Awake/alert Behavior During Therapy: Flat affect Overall Cognitive Status: No family/caregiver present to determine baseline cognitive functioning                                 General Comments: pt follows commands well with increased time, inconsistent reponses to questions at times     General Comments       Exercises     Shoulder Instructions      Home Living Family/patient expects to be discharged to:: Skilled nursing facility                                 Additional Comments: From [facility] per chart      Prior Functioning/Environment Prior Level of Function : Patient poor historian/Family not available             Mobility Comments: per PT eval, pt reported using walker for mobility and able to manage ADLs. Pt did not report PLOF on OT eval          OT Problem List: Decreased strength;Decreased activity tolerance;Decreased range of motion;Impaired balance (sitting and/or standing);Impaired vision/perception;Decreased coordination;Decreased cognition;Decreased safety awareness;Impaired tone;Impaired UE functional use      OT Treatment/Interventions: Self-care/ADL training;Therapeutic exercise;Energy conservation;DME and/or AE instruction;Therapeutic activities;Patient/family education    OT Goals(Current goals can be found in the care plan section) Acute Rehab OT Goals Patient Stated Goal: pt agreeable for OT evaluation and education OT Goal  Formulation: Patient unable to participate in goal setting Time For Goal Achievement: 09/25/22 Potential to Achieve Goals: Fair  OT Frequency: Min 2X/week    Co-evaluation              AM-PAC OT "6 Clicks" Daily Activity     Outcome Measure Help from another person eating meals?: Total Help from another person taking care of personal grooming?: A Lot Help from another person toileting, which includes using toliet, bedpan, or urinal?: Total Help from another person bathing (including washing, rinsing, drying)?: A Lot Help from another person to put on and taking off regular upper body clothing?: A Lot Help from another person to put on and taking off regular lower body clothing?: Total 6 Click Score: 9   End of Session Nurse Communication: Mobility status  Activity Tolerance: Patient tolerated treatment well Patient left: in bed;with call bell/phone within reach  OT Visit Diagnosis: Unsteadiness on feet (R26.81);Other abnormalities of gait and mobility (R26.89);Muscle weakness (generalized) (M62.81)  Time: 5784-6962 OT Time Calculation (min): 14 min Charges:  OT General Charges $OT Visit: 1 Visit OT Evaluation $OT Eval Moderate Complexity: 1 Mod  Bradd Canary, OTR/L Acute Rehab Services Office: [phone]   Lorre Munroe 09/11/2022, 12:24 PM

## 2022-09-11 NOTE — Progress Notes (Signed)
Spoke with [employer] regarding pt's contact information. [place] [facility] informed me that pt has no contacts listed on his chart, and the he is his own representative.Unable to inform anyone/family member at this time.

## 2022-09-11 NOTE — ED Notes (Signed)
Pt peripheral edema appears to be getting worse, noted upon cleaning pt this am. MD made aware.

## 2022-09-11 NOTE — Evaluation (Signed)
Speech Language Pathology Evaluation Patient Details Name: Jacob Bass MRN: [id number] DOB: 1960/11/28 Today's Date: 09/11/2022 Time: 0950-1010 SLP Time Calculation (min) (ACUTE ONLY): 20 min  Problem List:  Patient Active Problem List   Diagnosis Date Noted   Acute CVA (cerebrovascular accident) (HCC) 09/10/2022   History of CVA (cerebrovascular accident) 09/10/2022   HTN (hypertension) 09/10/2022   COPD (chronic obstructive pulmonary disease) (HCC) 09/10/2022   Anemia 09/10/2022   Cirrhosis (HCC) 09/10/2022   PUD (peptic ulcer disease) 09/10/2022   Past Medical History:  Past Medical History:  Diagnosis Date   Cirrhosis (HCC)    COPD (chronic obstructive pulmonary disease) (HCC)    Gastric ulcer    Stroke (HCC)    Past Surgical History: History reviewed. No pertinent surgical history. HPI:  Jacob Bass is a 62 y.o. male who presented to ED from [facility] via EMS with left sided weakness, speech changes, wheezing. PMH/PSH includes prior CVA, subarachnoid hemorrhage, cirrhosis, hep C, hx of etOH abuse, dementia, COPD.  Pt known to SLP from prior admissions, with baseline dysarthria and oral dysphagia. He was most recently seen by SLP on 04/13/22 with recommendations for Dysphagia 1 solids and thin liquids.   Assessment / Plan / Recommendation Clinical Impression  Pt presents with severe anathria. He has right labial weakness and severe lingual weakness, possibly bilateral. He can follow 5/5 one step commands with his RUE, but does not consistently respond to other commands with facial movement or phonation, head nodding or shaking etc. He often laughs in response to commands or cues. With heavy teaching SLP able to elicit improved Y/N responses with a gesture (thumbs up/down) or head nod to basic biographical information, but pt could not carry this over. In pts last SLE (documented in the second chart) pt appears to have similar communication impairments and is likely close to his  baseline though swallowing does seem worsened by this CVA. Will not f/u for cognition/speech or language but only dysphagia.    SLP Assessment  SLP Visit Diagnosis: Cognitive communication deficit (R41.841)    Recommendations for follow up therapy are one component of a multi-disciplinary discharge planning process, led by the attending physician.  Recommendations may be updated based on patient status, additional functional criteria and insurance authorization.    Follow Up Recommendations  Skilled nursing-short term rehab (<3 hours/day)    Assistance Recommended at Discharge  Frequent or constant Supervision/Assistance  Functional Status Assessment    Frequency and Duration           SLP Evaluation Cognition  Overall Cognitive Status: Impaired/Different from baseline Arousal/Alertness: Awake/alert Orientation Level: Oriented to person Attention: Focused;Sustained Focused Attention: Appears intact Sustained Attention: Appears intact       Comprehension  Auditory Comprehension Overall Auditory Comprehension: Impaired Yes/No Questions: Impaired Basic Biographical Questions: 51-75% accurate Basic Immediate Environment Questions: 50-74% accurate Commands: Within Functional Limits    Expression Expression Primary Mode of Expression: Nonverbal - gestures Verbal Expression Overall Verbal Expression: Impaired Written Expression Dominant Hand: Right   Oral / Motor  Oral Motor/Sensory Function Overall Oral Motor/Sensory Function: Severe impairment Facial ROM: Reduced right;Suspected CN VII (facial) dysfunction Facial Symmetry: Abnormal symmetry right;Suspected CN VII (facial) dysfunction Facial Strength: Reduced right;Suspected CN VII (facial) dysfunction Lingual ROM: Reduced right;Suspected CN XII (hypoglossal) dysfunction Lingual Symmetry: Abnormal symmetry right;Abnormal symmetry left Motor Speech Overall Motor Speech: Impaired Respiration: Within functional  limits Phonation: Normal Articulation: Impaired Level of Impairment: Word Intelligibility: Intelligibility reduced Word: 0-24% accurate Phrase: Not tested Sentence: Not  tested Conversation: Not tested            Claudine Mouton 09/11/2022, 10:44 AM

## 2022-09-11 NOTE — ED Notes (Signed)
Pt OTF to swallow

## 2022-09-11 NOTE — Progress Notes (Signed)
Modified Barium Swallow Progress Note  Patient Details  Name: Jacob Bass Needs MRN: [id number] Date of Birth: 08/07/1961  Today's Date: 09/11/2022  Modified Barium Swallow completed.  Full report located under Chart Review in the Imaging Section.  Brief recommendations include the following:  Clinical Impression  Patient presenting with a severe to profoundly impaired oral phase of swallow and mild-moderately impaired pharyngeal phase of swallow. During oral phase, patient exhibited little to no lingual or bilabial movement, leading to thin liquid and nectar thick liquid barium remaining in anterior portion of oral cavity and eventually spilling out of anterior portion of oral cavity. With thin liquid barium, one instance of premature spillage from oral cavity into laryngeal vestibule which was then aspirated (silent aspiration). With cup sip thin barium, patient able to initiate one swallow when cued with swallow initiation delay at level of pyriform sinus. Patient is not safe for any PO's at this time and prognosis for return to PO's is guarded secondary to severity of his dysphagia which is likely a decline from his baseline dysphagia. SLP will continue to follow patient for PO trials.   Swallow Evaluation Recommendations       SLP Diet Recommendations: NPO       Medication Administration: Via alternative means               Oral Care Recommendations: Oral care QID;Staff/trained caregiver to provide oral care        Sonia Baller, MA, CCC-SLP Speech Therapy

## 2022-09-11 NOTE — Progress Notes (Addendum)
STROKE TEAM PROGRESS NOTE   No family is at the bedside.  Pt lying in bed, severe dysarthria, but awake alert, did not pass swallow, NPO for now. Left hemiparesis and MRI showed left CR small infarct.    OBJECTIVE Temp:  [97.7 F (36.5 C)-98.5 F (36.9 C)] 98 F (36.7 C) (01/09 0530) Pulse Rate:  [84-108] 84 (01/09 1050) Cardiac Rhythm: Normal sinus rhythm (01/08 1600) Resp:  [11-23] 14 (01/09 1050) BP: (94-154)/(57-117) 127/72 (01/09 1030) SpO2:  [96 %-100 %] 99 % (01/09 1050)  Recent Labs  Lab 09/10/22 1007  GLUCAP 75   Recent Labs  Lab 09/10/22 1000  NA 139  K 4.0  CL 110  CO2 21*  GLUCOSE 95  BUN 23  CREATININE 2.09*  CALCIUM 7.8*   Recent Labs  Lab 09/10/22 1000  AST 113*  ALT 64*  ALKPHOS 67  BILITOT 1.8*  PROT 6.7  ALBUMIN 1.7*   Recent Labs  Lab 09/10/22 1000  WBC 4.1  NEUTROABS 2.2  HGB 8.8*  HCT 25.8*  MCV 100.8*  PLT 85*   No results for input(s): "CKTOTAL", "CKMB", "CKMBINDEX", "TROPONINI" in the last 168 hours. Recent Labs    09/10/22 1009  LABPROT 17.5*  INR 1.5*   Recent Labs    09/10/22 1755  COLORURINE YELLOW  LABSPEC 1.026  PHURINE 5.0  GLUCOSEU NEGATIVE  HGBUR SMALL*  BILIRUBINUR NEGATIVE  KETONESUR NEGATIVE  PROTEINUR NEGATIVE  NITRITE NEGATIVE  LEUKOCYTESUR NEGATIVE       Component Value Date/Time   CHOL 111 09/11/2022 0531   TRIG 64 09/11/2022 0531   HDL 24 (L) 09/11/2022 0531   CHOLHDL 4.6 09/11/2022 0531   VLDL 13 09/11/2022 0531   LDLCALC 74 09/11/2022 0531   Lab Results  Component Value Date   HGBA1C 4.5 (L) 09/11/2022   No results found for: "LABOPIA", "COCAINSCRNUR", "LABBENZ", "AMPHETMU", "THCU", "LABBARB"  Recent Labs  Lab 09/10/22 1009  ETH <10    I have personally reviewed the radiological images below and agree with the radiology interpretations.  MR BRAIN WO CONTRAST  Result Date: 09/10/2022 CLINICAL DATA:  Provided history: Neuro deficit, acute, stroke suspected. Slurred speech,  left-sided weakness, aphasia. EXAM: MRI HEAD WITHOUT CONTRAST TECHNIQUE: Multiplanar, multiecho pulse sequences of the brain and surrounding structures were obtained without intravenous contrast. COMPARISON:  Noncontrast head CT and CT angiogram head/neck performed earlier today 09/10/2022. Brain MRI 07/01/2022. FINDINGS: Intermittently motion degraded examination, limiting evaluation. Most notably, there is moderate motion degradation of the least motion degraded axial T2 FLAIR sequence, moderate-to-severe motion degradation of the axial T2 sequence, severe motion degradation of the axial SWI sequence, moderate-to-severe motion degradation of the sagittal T1-weighted sequence, severe motion degradation of the axial T1-weighted sequence and moderate motion degradation of the least motion degraded coronal T2-weighted sequence. Brain: Moderate generalized cerebral atrophy. Mild cerebellar atrophy. 15 mm acute infarct centered within the posterior left frontal lobe white matter with subtle involvement of the overlying cortex (for instance as seen on series 3, images thirty-five and 36) (series 4, image 18). Redemonstrated chronic infarct within the right corona radiata/basal ganglia with ex vacuo dilatation of the frontal horn and body of the right lateral ventricle. Redemonstrated chronic lacunar infarcts elsewhere within the bilateral cerebral hemispheric white matter, bilateral deep gray nuclei and within the right aspect of the pons. Background advanced patchy and confluent T2 FLAIR hyperintense signal abnormality within the cerebral white matter, nonspecific but compatible with chronic small vessel ischemic disease. Within the limitations of motion  degradation, there is no appreciable intracranial mass or extra-axial fluid collection. No midline shift. Vascular: Maintained flow voids within the proximal large arterial vessels. Skull and upper cervical spine: No focal suspicious marrow lesion. Sinuses/Orbits: No  mass or acute finding within the imaged orbits. No significant paranasal sinus disease. IMPRESSION: 1. Significantly motion degraded examination. 2. 15 mm acute cortical/subcortical infarct within the posterior left frontal lobe (MCA territory). 3. Background parenchymal atrophy, advanced chronic small vessel ischemic disease and chronic infarcts, as detailed and unchanged from the prior brain MRI of 07/01/2022. Electronically Signed   By: Jackey Loge D.O.   On: 09/10/2022 14:37   DG Chest Portable 1 View  Result Date: 09/10/2022 CLINICAL DATA:  Mental status change, concern for aspiration EXAM: PORTABLE CHEST 1 VIEW COMPARISON:  06/30/2022 FINDINGS: Left lower lobe retrocardiac collapse/consolidation noted obscuring the left hemidiaphragm compatible with pneumonia. No large effusion. Right lung remains clear. Stable heart size and vascularity. Negative for edema or pneumothorax. Trachea midline. Degenerative changes of the spine. IMPRESSION: Left lower lobe retrocardiac collapse/consolidation. Electronically Signed   By: Judie Petit.  Shick M.D.   On: 09/10/2022 12:23   DG Elbow 2 Views Left  Result Date: 09/10/2022 CLINICAL DATA:  Abrasion, bruising left elbow EXAM: LEFT ELBOW - 2 VIEW COMPARISON:  None Available. FINDINGS: Moderate degenerative arthropathy of the left elbow joint with joint space loss, sclerosis and bony spurring. Bones are osteopenic. No acute osseous finding or displaced fracture. No large effusion. Extremity subcutaneous edema noted. IMPRESSION: Degenerative changes as above. No acute osseous finding by plain radiography. Electronically Signed   By: Judie Petit.  Shick M.D.   On: 09/10/2022 12:21   CT ANGIO HEAD NECK W WO CM W PERF (CODE STROKE)  Result Date: 09/10/2022 CLINICAL DATA:  Stroke suspected EXAM: CT ANGIOGRAPHY HEAD AND NECK CT PERFUSION BRAIN TECHNIQUE: Multidetector CT imaging of the head and neck was performed using the standard protocol during bolus administration of intravenous  contrast. Multiplanar CT image reconstructions and MIPs were obtained to evaluate the vascular anatomy. Carotid stenosis measurements (when applicable) are obtained utilizing NASCET criteria, using the distal internal carotid diameter as the denominator. Multiphase CT imaging of the brain was performed following IV bolus contrast injection. Subsequent parametric perfusion maps were calculated using RAPID software. RADIATION DOSE REDUCTION: This exam was performed according to the departmental dose-optimization program which includes automated exposure control, adjustment of the mA and/or kV according to patient size and/or use of iterative reconstruction technique. CONTRAST:  57mL OMNIPAQUE IOHEXOL 350 MG/ML SOLN COMPARISON:  CTA head/neck 10/13/18 FINDINGS: CT HEAD FINDINGS Please see same-day CT brain for intracranial findings. CTA NECK FINDINGS Aortic arch: Standard branching. Imaged portion shows no evidence of aneurysm or dissection. No significant stenosis of the major arch vessel origins. Right carotid system: There is moderate plaque burden of the right carotid bifurcation that results in minimal overall stenosis of the origin of the right ICA. Atherosclerotic plaque is also seen in the cavernous segment of the right ICA with associated mild stenosis. Left carotid system: No evidence of dissection, stenosis (50% or greater) or occlusion. There is a small laterally projecting outpouching along the distal petrous segment of the left ICA (series 9, image 112) measuring up to 2 mm, which could represent a small aneurysm. 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 The degree of  stenosis and size of the pseudoaneurysm have increased from 2020. there is mild narrowing of the origin of the left vertebral artery. Skeleton: Mild  multilevel degenerative changes. No worrisome lesions. Other neck: Subcentimeter left thyroid nodule. Nonspecific focal soft tissue thickening along the midline submental region (series 11, image 89) measuring 1.0 x 1.2 x 0.6 cm. There is nonspecific prominence of the frenulum of the tongue, likely near the confluence of major salivary ducts (series 9, image 193).These findings are grossly unchanged compared to 10/13/18 Upper chest: Moderate left-sided pleural effusion. Aortic atherosclerotic calcifications. Ground-glass opacity along the anterior aspect of the left upper lobe measuring up to 2.0 x 1.2 cm. Review of the MIP images confirms the above findings CTA HEAD FINDINGS Anterior circulation: No significant stenosis, proximal occlusion, aneurysm, or vascular malformation. Posterior circulation: There is mild focal narrowing in the mid segment of the basilar artery (series 10, image 85). There is moderate focal stenosis of the P1 segment of the right PCA (series 9, image 101). There is moderate stenosis in the distal P2 segment of the left PCA (series 9, image 91). Venous sinuses: As permitted by contrast timing, patent. Anatomic variants: None Review of the MIP images confirms the above findings CT Brain Perfusion Findings: ASPECTS: 10 CBF (<30%) Volume: 7mL Perfusion (Tmax>6.0s) volume: 90mL Mismatch Volume: 43mL Infarction Location:None IMPRESSION: 1. 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 The degree of stenosis and size of the pseudoaneurysm have increased from 2020. 2. Moderate focal stenosis of the P1 segment of the right PCA and moderate stenosis in the distal P2 segment of the left PCA. These findings are unchanged compared to 2020. 3. 2 mm outpouching along the distal petrous segment of the left ICA, which could represent a small aneurysm. 4. New moderate left-sided  pleural effusion and ground-glass opacity along the anterior aspect of the left upper lobe measuring up to 2.0 x 1.2 cm. Although this could be infectious or inflammatory, further evaluation with a dedicated CT of the chest is recommended. Findings were discussed with Dr. Leonel Ramsay at 11:11 AM on 09/10/22 Aortic Atherosclerosis (ICD10-I70.0). Electronically Signed   By: Marin Roberts M.D.   On: 09/10/2022 11:55   CT HEAD CODE STROKE WO CONTRAST  Result Date: 09/10/2022 CLINICAL DATA:  Code stroke. Neuro deficit, acute, stroke suspected. EXAM: CT HEAD WITHOUT CONTRAST TECHNIQUE: Contiguous axial images were obtained from the base of the skull through the vertex without intravenous contrast. RADIATION DOSE REDUCTION: This exam was performed according to the departmental dose-optimization program which includes automated exposure control, adjustment of the mA and/or kV according to patient size and/or use of iterative reconstruction technique. COMPARISON:  Brain MRI 07/01/2022. Prior head CT examinations 06/30/2022 and earlier. FINDINGS: Brain: Moderate generalized cerebral atrophy. Mild cerebellar atrophy. Chronic infarct within the right corona radiata/basal ganglia with ex vacuo dilatation of the frontal horn and body of the right lateral ventricle. Redemonstrated chronic lacunar infarcts elsewhere within the right corona radiata, bilateral deep gray nuclei and within the right aspect of the pons. Background advanced patchy and confluent hypoattenuation within the cerebral white matter, nonspecific but compatible with chronic small vessel ischemic disease. There is no acute intracranial hemorrhage. No demarcated cortical infarct. No extra-axial fluid collection. No evidence of an intracranial mass. No midline shift. Vascular: No hyperdense vessel.  Atherosclerotic calcifications. Skull: No fracture or aggressive osseous lesion. Sinuses/Orbits: No mass or  acute finding within the imaged orbits. No significant  paranasal sinus disease. ASPECTS [facility] Stroke Program Early CT Score) - Ganglionic level infarction (caudate, lentiform nuclei, internal capsule, insula, M1-M3 cortex): 7 - Supraganglionic infarction (M4-M6 cortex): 3 Total score (0-10 with 10 being normal): 10 (when discounting chronic infarcts). No acute intracranial abnormality is identified. These results were communicated to Dr. Amada Jupiter at 10:29 amon 1/8/2024by text page via the [facility] messaging system. IMPRESSION: 1. No evidence of acute intracranial abnormality. 2. Parenchymal atrophy, advanced chronic small vessel disease and chronic infarcts, as detailed and not appreciably changed from the prior brain MRI of 07/01/2022. Electronically Signed   By: Jackey Loge D.O.   On: 09/10/2022 10:30     PHYSICAL EXAM  Temp:  [97.7 F (36.5 C)-98.5 F (36.9 C)] 98 F (36.7 C) (01/09 0530) Pulse Rate:  [84-108] 84 (01/09 1050) Resp:  [11-23] 14 (01/09 1050) BP: (94-154)/(57-117) 127/72 (01/09 1030) SpO2:  [96 %-100 %] 99 % (01/09 1050)  General - Well nourished, well developed, in no apparent distress.  Ophthalmologic - fundi not visualized due to noncooperation.  Cardiovascular - Regular rhythm and rate.  Neuro - awake, alert, eyes open, orientated to self, place but not able to tell me his age or time. Severe dysarthria and intangible words, but following all simple commands answered orientation questions with nearly intangible workds. No gaze palsy, tracking bilaterally, blinking to visual threat bilaterally. Left facial droop. Tongue midline. RUE 4/5 proximal and 3/5 finger grip. LUE 3/5 proximal and 0/5 finger grip. RLE 2/5 proximal and 3/5 toe DF/PF, LLE proximal 2-/5 and toe DF/PF 2/5. Sensation decreased on the left, right FTN intact grossly but slow in action, gait not tested.     ASSESSMENT/PLAN [name] is a 62 y.o. male with history of stroke with left sided residual, gastric ulcer, cirrhosis, COPD admitted for altered  mental status, worsening left-sided weakness, left facial droop and dysarthria. No tPA given due to outside window.    Stroke, incidental finding:  left CR/SO infarct likely secondary to small vessel disease source CT no acute finding but chronic right CR, bilateral BG and right pontine infarcts. 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  Right P1 moderate stenosis and left P2 moderate stenosis MRI left acute CR small infarct 2D Echo EF 55 to 60% LDL 74 HgbA1c 4.5 UDS negative Heparin subcu for VTE prophylaxis No antithrombotic prior to admission, now on aspirin 81 mg daily. No DAPT for now given thrombocytopenia.  Patient counseled to be compliant with his antithrombotic medications Ongoing aggressive stroke risk factor management Therapy recommendations: SNF Disposition: Pending  Recrudescence of old stroke symptoms in the setting of metabolic encephalopathy Nursing home resident, baseline left-sided weakness from previous stroke No worsening of left-sided symptoms CT and MRI showed chronic right CR, bilateral BG and right pontine infarcts Ammonia level 99 AKI with creatinine 2.09-> 1.77 AST/ALT 113/64 TSH 10.984 Hemoglobin 8.8 Metabolic encephalopathy management per primary team  Hypertension Stable Long term BP goal normotensive  Hyperlipidemia Home meds:  lipitor 40  LDL 74, goal < 70 Now lipitor on hold given elevated LFTs. Once normalizes, continue lipitor Continue statin at discharge  Thrombocytopenia Cirrhosis  Elevated LFTs - AST/ALT 113/64 Ammonia level 99 Platelet 85-> 83 On aspirin, avoid DAPT  Other Stroke Risk Factors Hx stroke/TIA - details uncleare  Other Active Problems ?  Possible hypothyroidism, TSH 10.984, may consider levothyroxine AKI creatinine 2.09-> 1.77  Hospital day # 0  Neurology  will sign off. Please call with questions. Pt will follow up with stroke clinic NP at [facility] in about 4 weeks. Thanks for the  consult.   Marvel Plan, MD PhD Stroke Neurology 09/11/2022 11:04 AM    To contact Stroke Continuity provider, please refer to [email or web address]. After hours, contact General Neurology

## 2022-09-11 NOTE — Progress Notes (Signed)
Unable to complete echocardiogram, patient going off the floor to other testing.  Darlina Sicilian

## 2022-09-11 NOTE — Progress Notes (Signed)
Speech Language Pathology Treatment: Dysphagia  Patient Details Name: Jacob Bass MRN: [id number] DOB: 1961/01/27 Today's Date: 09/11/2022 Time: 0950-1010 SLP Time Calculation (min) (ACUTE ONLY): 20 min  Assessment / Plan / Recommendation Clinical Impression  Pt presents similarly as yesterday. He is still in the ED, has no oral suction set up without ability to change this given presence of Purewick. He is alert and attentive but severity of oral weakness appears about the same. He ahs not ben managing his secretions; his tongue is dry and glued to his lips. Oral care attempted. SLP repositioned pt and provided ice chip trials with total assisted head positioning to facilitate movement of bolus. Pt had a swallow response, but delayed coughing, likely of pooled pharyngeal secretions observed. It is too risky to offer further trials without oral suction available. Unsure if pt is deconditioned or actively worse from new CVA. Will need instrumental assessment of swallowing; will plan to attempt tomorrow.   HPI HPI: Jacob Bass is a 62 y.o. male who presented to ED from [facility] via EMS with left sided weakness, speech changes, wheezing.  MRI shows 15 mm acute cortical/subcortical infarct within the posterior left frontal lobe PMH/PSH includes prior CVA, subarachnoid hemorrhage, cirrhosis, hep C, hx of etOH abuse, dementia, COPD.  Pt known to SLP from prior admissions, with baseline dysarthria and oral dysphagia. He was most recently seen by SLP on 04/13/22 with recommendations for Dysphagia 1 solids and thin liquids.      SLP Plan         Recommendations for follow up therapy are one component of a multi-disciplinary discharge planning process, led by the attending physician.  Recommendations may be updated based on patient status, additional functional criteria and insurance authorization.    Recommendations  Diet recommendations: NPO                Oral Care Recommendations: Oral care  QID Follow Up Recommendations: Skilled nursing-short term rehab (<3 hours/day) Assistance recommended at discharge: Frequent or constant Supervision/Assistance SLP Visit Diagnosis: Dysphagia, oropharyngeal phase (R13.12)           Jacob Bass, Jacob Bass  09/11/2022, 10:29 AM

## 2022-09-11 NOTE — Progress Notes (Addendum)
[facility]  PROGRESS NOTE  Sartaj Alfonse Ras ZOX:[id number] DOB: 1961-07-11 DOA: 09/10/2022 PCP: Donia Ast., MD   Brief HPI:   62 year old male with medical history of stroke, hypertension, COPD, anemia, cirrhosis presented with altered mental status and weakness. Patient resides at [facility] and around 10 PM he was found to be more quite and not responding to questions appropriately. In the ED COVID and RSV were negative.  Ammonia level 99.  CT head showed no acute abnormality.  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  Also showed moderate focal stenosis of the right P1 left P2.  MRI of the brain was positive for acute infarct.  Neurology was consulted for acute stroke workup.    Subjective   Patient seen and examined, denies shortness of breath or chest pain.   Assessment/Plan:     Acute CVA -Known history of prior CVA with left-sided deficits -At facility he was found not to be responding with worsening left side deficits-MRI brain was positive for acute stroke -Neurology was consulted -Patient started on aspirin and Plavix -Follow echocardiogram  Acute kidney injury -Baseline creatinine around 1 -Presented with creatinine of 2.08 -Started on IV fluids, also given 1 dose of IV albumin -Follow BMP  COPD -Continue Spiriva and Advair -Continue scheduled DuoNebs  Hypertension  -Holding amlodipine, Lasix, lisinopril, metoprolol in setting of permissive hypertension  Liver cirrhosis -Ammonia level 99 -Patient takes lactulose and Lasix at home -Has worsening edema since October -Likely from hypoalbuminemia with albumin of 1.7 -Continue lactulose. Lasix on hold in setting of stroke and AKI -Also got IV albumin   Dysphagia -Patient failed swallow evaluation -Currently n.p.o. -If no improvement, will have to get palliative care involved for goals of care discussion  Left lower lobe retrocardiac  consolidation/collapse -Clinically ,does not appear to have pneumonia -Procalcitonin 0.10, 0.12 -Will not initiate antibiotics  Medications      stroke: early stages of recovery book   Does not apply Once   aspirin EC  81 mg Oral Daily   atorvastatin  40 mg Oral Daily   clopidogrel  300 mg Oral Once   And   clopidogrel  75 mg Oral Daily   ipratropium-albuterol  3 mL Nebulization Q4H   lactulose  30 g Oral TID   mometasone-formoterol  2 puff Inhalation BID   pantoprazole  40 mg Oral BID   tamsulosin  0.4 mg Oral Daily   umeclidinium bromide  1 puff Inhalation Daily     Data Reviewed:   CBG:  Recent Labs  Lab 09/10/22 1007  GLUCAP 75    SpO2: 100 %    Vitals:   09/11/22 0000 09/11/22 0200 09/11/22 0230 09/11/22 0530  BP: 116/70 (!) 141/77 126/71 (!) 145/79  Pulse: 91 (!) 108  97  Resp: 15 15 15 15   Temp:  97.7 F (36.5 C)  98 F (36.7 C)  TempSrc:  Oral  Axillary  SpO2: 100% 100%  100%  Weight:      Height:          Data Reviewed:  Basic Metabolic Panel: Recent Labs  Lab 09/10/22 1000  NA 139  K 4.0  CL 110  CO2 21*  GLUCOSE 95  BUN 23  CREATININE 2.09*  CALCIUM 7.8*    CBC: Recent Labs  Lab 09/10/22 1000  WBC 4.1  NEUTROABS 2.2  HGB 8.8*  HCT 25.8*  MCV 100.8*  PLT 85*    LFT Recent Labs  Lab 09/10/22 1000  AST 113*  ALT 64*  ALKPHOS 67  BILITOT 1.8*  PROT 6.7  ALBUMIN 1.7*     Antibiotics: Anti-infectives (From admission, onward)    Start     Dose/Rate Route Frequency Ordered Stop   09/10/22 1345  cefTRIAXone (ROCEPHIN) 1 g in sodium chloride 0.9 % 100 mL IVPB        1 g 200 mL/hr over 30 Minutes Intravenous  Once 09/10/22 1331 09/10/22 1605   09/10/22 1345  azithromycin (ZITHROMAX) 500 mg in sodium chloride 0.9 % 250 mL IVPB        500 mg 250 mL/hr over 60 Minutes Intravenous  Once 09/10/22 1331 09/10/22 1605        DVT prophylaxis: SCDs  Code Status: Full code  Family Communication:    CONSULTS  neurology   Objective    Physical Examination:   General: Appears in no acute distress Cardiovascular: S1-S2, regular Respiratory: Clear to auscultation bilaterally Abdomen: Soft, nontender, no organomegaly Extremities: Bilateral 2+ edema in the lower extremities Neurologic: Alert, following commands, left-sided hemiplegia   Status is: Inpatient:          Meredeth Ide   [facility] If 7PM-7AM, please contact night-coverage at [email or web address], Office  [phone]   09/11/2022, 8:18 AM  LOS: 0 days

## 2022-09-12 DIAGNOSIS — D649 Anemia, unspecified: Secondary | ICD-10-CM | POA: Diagnosis not present

## 2022-09-12 DIAGNOSIS — I639 Cerebral infarction, unspecified: Secondary | ICD-10-CM | POA: Diagnosis not present

## 2022-09-12 DIAGNOSIS — K7460 Unspecified cirrhosis of liver: Secondary | ICD-10-CM | POA: Diagnosis not present

## 2022-09-12 LAB — GLUCOSE, CAPILLARY
Glucose-Capillary: 50 mg/dL — ABNORMAL LOW (ref 70–99)
Glucose-Capillary: 96 mg/dL (ref 70–99)
Glucose-Capillary: 64 mg/dL — ABNORMAL LOW (ref 70–99)
Glucose-Capillary: 96 mg/dL (ref 70–99)
Glucose-Capillary: 82 mg/dL (ref 70–99)
Glucose-Capillary: 70 mg/dL (ref 70–99)

## 2022-09-12 LAB — PROCALCITONIN: Procalcitonin: 0.13 ng/mL

## 2022-09-12 MED ORDER — LACTULOSE ENEMA: 300.0000 mL | Freq: Two times a day (BID) | ORAL | Status: DC

## 2022-09-12 MED ORDER — DEXTROSE 50 % IV SOLN
INTRAVENOUS | Status: AC
  Administered 2022-09-12: 50 mL
  Filled 2022-09-12: qty 50

## 2022-09-12 MED ORDER — PANTOPRAZOLE SODIUM 40 MG IV SOLR
40.0000 mg | INTRAVENOUS | Status: DC
  Administered 2022-09-12 – 2022-10-21 (×40): 40 mg via INTRAVENOUS
  Filled 2022-09-12 (×41): qty 10

## 2022-09-12 MED ORDER — ASPIRIN 300 MG RE SUPP
300.0000 mg | Freq: Every day | RECTAL | Status: DC
  Administered 2022-09-12 – 2022-09-15 (×4): 300 mg via RECTAL
  Filled 2022-09-12 (×4): qty 1

## 2022-09-12 MED ORDER — DEXTROSE-NACL 5-0.45 % IV SOLN: INTRAVENOUS | Status: DC

## 2022-09-12 MED ORDER — DEXTROSE 50 % IV SOLN: 12.5000 g | INTRAVENOUS | Status: AC

## 2022-09-12 MED ORDER — DEXTROSE 50 % IV SOLN
INTRAVENOUS | Status: AC
  Administered 2022-09-12: 12.5 g via INTRAVENOUS
  Filled 2022-09-12: qty 50

## 2022-09-12 MED ORDER — IPRATROPIUM-ALBUTEROL 0.5-2.5 (3) MG/3ML IN SOLN
3.0000 mL | RESPIRATORY_TRACT | Status: DC | PRN
  Administered 2022-09-13 – 2022-10-02 (×9): 3 mL via RESPIRATORY_TRACT
  Filled 2022-09-12 (×8): qty 3

## 2022-09-12 MED ORDER — LACTULOSE ENEMA
300.0000 mL | Freq: Two times a day (BID) | ORAL | Status: AC
  Administered 2022-09-12 (×2): 300 mL via RECTAL
  Filled 2022-09-12 (×2): qty 300

## 2022-09-12 NOTE — Progress Notes (Signed)
Physical Therapy Treatment Patient Details Name: Jacob Bass MRN: [id number] DOB: 1960/12/24 Today's Date: 09/12/2022   History of Present Illness 62 y.o. male presents to [facility] on 09/10/2022 with AMS and L weakness from [facility]. 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 PMH includes CVA, COPD, cirrhosis, gastric ulcer.    PT Comments    Pt with continued progress towards acute goals this session with focus on sitting balance and progression of OOB transfers within pt tolerance. Pt able to come to sitting EOB with mod assist with pt demonstrating increased initiation of Les and good activation to RUE to assist with trunk elevation. Pt able to maintain sitting EOB ~10 mins with intermittent min-mod assist to maintain balance as pt with x3 posterior LOB. Pt ultimately agreeable to sit<>stand transfers with pt able to achieve partial stand with face-to-face technique and max assist. Pt continues to fatigue quickly with and is limited by LUE and BLE weakness and edema.  Current plan remains appropriate to address deficits and maximize functional independence and decrease caregiver burden. Pt continues to benefit from skilled PT services to progress toward functional mobility goals.    Recommendations for follow up therapy are one component of a multi-disciplinary discharge planning process, led by the attending physician.  Recommendations may be updated based on patient status, additional functional criteria and insurance authorization.  Follow Up Recommendations  Skilled nursing-short term rehab (<3 hours/day) Can patient physically be transported by private vehicle: No   Assistance Recommended at Discharge Frequent or constant Supervision/Assistance  Patient can return home with the following Two people to help with walking and/or transfers;Two people to help with bathing/dressing/bathroom;Assistance with cooking/housework;Assistance with feeding;Direct supervision/assist for medications  management;Direct supervision/assist for financial management;Assist for transportation;Help with stairs or ramp for entrance   Equipment Recommendations   (defer to post-acute)    Recommendations for Other Services       Precautions / Restrictions Precautions Precautions: Fall;Other (comment) Precaution Comments: flexiseal Restrictions Weight Bearing Restrictions: No     Mobility  Bed Mobility Overal bed mobility: Needs Assistance Bed Mobility: Rolling     Supine to sit: HOB elevated, Mod assist Sit to supine: Max assist   General bed mobility comments: mod a to come to sitting EOB, pt able to initiate LEs to EOB, needing mod assist to bring them rest of way and off, pt with good initiation of RUE on bedrail to elevate trunk    Transfers Overall transfer level: Needs assistance Equipment used: None Transfers: Sit to/from Stand Sit to Stand: Total assist           General transfer comment: max-total a with face to face technique to come to partial stand, pt adamently stating "no" when asked if he would try, with encouragement will to try, pt attempting to lay back down after initial attempt    Ambulation/Gait                   Stairs             Wheelchair Mobility    Modified Rankin (Stroke Patients Only) Modified Rankin (Stroke Patients Only) Pre-Morbid Rankin Score: Moderate disability (per pt report) Modified Rankin: Severe disability     Balance Overall balance assessment: Needs assistance Sitting-balance support: Single extremity supported, Feet supported Sitting balance-Leahy Scale: Fair Sitting balance - Comments: pt able to intermittently maintain sitting balance without assist, min-mod to asssit with posterior LOB Postural control: Posterior lean   Standing balance-Leahy Scale: Zero  Cognition Arousal/Alertness: Awake/alert Behavior During Therapy: Flat affect Overall Cognitive Status:  No family/caregiver present to determine baseline cognitive functioning                                 General Comments: pt follows commands well with increased time, inconsistent reponses to questions at times        Exercises      General Comments General comments (skin integrity, edema, etc.): VSS on RA, pt with significant wheezing although denies SOB, sats stable, signifigant bil LE edema      Pertinent Vitals/Pain Pain Assessment Pain Assessment: Faces Faces Pain Scale: No hurt Pain Intervention(s): Monitored during session    Home Living                          Prior Function            PT Goals (current goals can now be found in the care plan section) Acute Rehab PT Goals PT Goal Formulation: With patient Time For Goal Achievement: 09/24/22 Progress towards PT goals: Progressing toward goals    Frequency    Min 3X/week      PT Plan      Co-evaluation              AM-PAC PT "6 Clicks" Mobility   Outcome Measure  Help needed turning from your back to your side while in a flat bed without using bedrails?: A Lot Help needed moving from lying on your back to sitting on the side of a flat bed without using bedrails?: A Lot Help needed moving to and from a bed to a chair (including a wheelchair)?: Total Help needed standing up from a chair using your arms (e.g., wheelchair or bedside chair)?: Total Help needed to walk in hospital room?: Total Help needed climbing 3-5 steps with a railing? : Total 6 Click Score: 8    End of Session   Activity Tolerance: Patient tolerated treatment well Patient left: in bed;with call bell/phone within reach;with bed alarm set Nurse Communication: Mobility status PT Visit Diagnosis: Other abnormalities of gait and mobility (R26.89);Muscle weakness (generalized) (M62.81);Hemiplegia and hemiparesis Hemiplegia - Right/Left: Left Hemiplegia - caused by: Cerebral infarction     Time:  2841-3244 PT Time Calculation (min) (ACUTE ONLY): 16 min  Charges:  $Therapeutic Activity: 8-22 mins                     Oval Moralez R. PTA Acute Rehabilitation Services Office: [phone]    Catalina Antigua 09/12/2022, 4:21 PM

## 2022-09-12 NOTE — Progress Notes (Signed)
Speech Language Pathology Treatment: Dysphagia  Patient Details Name: Jacob Bass MRN: [id number] DOB: 05/14/1961 Today's Date: 09/12/2022 Time: 6333-5456 SLP Time Calculation (min) (ACUTE ONLY): 15 min  Assessment / Plan / Recommendation Clinical Impression  Patient seen by SLP for skilled treatment focused on dysphagia goals. When SLP arrived into room, patient was awake, alert and agreeable to session. He would occasionally comment/respond verbally at 1-2 word level however this was inconsistent. At times he would respond after delay and other times he would not respond at all. After oral care, SLP assessed his toleration with small, single ice chips. He accepted ice chip into mouth and did exhibit some oral motor movement of facial musculature but no lingual movement observed. Swallow was palpated one time with total of two ice chips trialed. Patient then telling SLP "no" to PO's and verbally declined all offers of liquids/solids PO's. Unfortunately patient continues to exhibit a severe-profound oral phase dysphagia with little to no lingual movement. SLP will continue to follow but prognosis is guarded for return to PO's even for pleasure.   HPI HPI: Jacob Bass is a 62 y.o. male who presented to ED from [facility] via EMS with left sided weakness, speech changes, wheezing. PMH/PSH includes prior CVA, subarachnoid hemorrhage, cirrhosis, hep C, hx of etOH abuse, dementia, COPD.  Pt known to SLP from prior admissions, with baseline dysarthria and oral dysphagia. He was most recently seen by SLP on 04/13/22 with recommendations for Dysphagia 1 solids and thin liquids.      SLP Plan  Continue with current plan of care      Recommendations for follow up therapy are one component of a multi-disciplinary discharge planning process, led by the attending physician.  Recommendations may be updated based on patient status, additional functional criteria and insurance authorization.     Recommendations  Diet recommendations: NPO Medication Administration: Via alternative means                Oral Care Recommendations: Oral care QID Follow Up Recommendations: Skilled nursing-short term rehab (<3 hours/day) Assistance recommended at discharge: Frequent or constant Supervision/Assistance SLP Visit Diagnosis: Dysphagia, oropharyngeal phase (R13.12) Plan: Continue with current plan of care           Jacob Baller, MA, CCC-SLP Speech Therapy

## 2022-09-12 NOTE — Plan of Care (Signed)
  Problem: Ischemic Stroke/TIA Tissue Perfusion: Goal: Complications of ischemic stroke/TIA will be minimized Outcome: Progressing   Problem: Self-Care: Goal: Ability to participate in self-care as condition permits will improve Outcome: Progressing   Problem: Nutrition: Goal: Dietary intake will improve Outcome: Progressing Note: New order for cortrak in place.   Problem: Clinical Measurements: Goal: Ability to maintain clinical measurements within normal limits will improve Outcome: Progressing Goal: Will remain free from infection Outcome: Progressing Goal: Diagnostic test results will improve Outcome: Progressing Goal: Respiratory complications will improve Outcome: Progressing Goal: Cardiovascular complication will be avoided Outcome: Progressing   Problem: Activity: Goal: Risk for activity intolerance will decrease Outcome: Progressing   Problem: Nutrition: Goal: Adequate nutrition will be maintained Outcome: Progressing   Problem: Coping: Goal: Level of anxiety will decrease Outcome: Progressing   Problem: Elimination: Goal: Will not experience complications related to bowel motility Outcome: Progressing Goal: Will not experience complications related to urinary retention Outcome: Progressing   Problem: Pain Managment: Goal: General experience of comfort will improve Outcome: Progressing   Problem: Safety: Goal: Ability to remain free from injury will improve Outcome: Progressing   Problem: Skin Integrity: Goal: Risk for impaired skin integrity will decrease Outcome: Progressing   Problem: Education: Goal: Knowledge of disease or condition will improve Outcome: Not Progressing Goal: Knowledge of secondary prevention will improve (MUST DOCUMENT ALL) Outcome: Not Progressing Goal: Knowledge of patient specific risk factors will improve Elta [place] N/A or DELETE if not current risk factor) Outcome: Not Progressing   Problem: Coping: Goal: Will verbalize  positive feelings about self Outcome: Not Progressing Goal: Will identify appropriate support needs Outcome: Not Progressing   Problem: Health Behavior/Discharge Planning: Goal: Ability to manage health-related needs will improve Outcome: Not Progressing Goal: Goals will be collaboratively established with patient/family Outcome: Not Progressing   Problem: Self-Care: Goal: Verbalization of feelings and concerns over difficulty with self-care will improve Outcome: Not Progressing Goal: Ability to communicate needs accurately will improve Outcome: Not Progressing   Problem: Nutrition: Goal: Risk of aspiration will decrease Outcome: Not Progressing   Problem: Education: Goal: Knowledge of General Education information will improve Description: Including pain rating scale, medication(s)/side effects and non-pharmacologic comfort measures Outcome: Not Progressing   Problem: Health Behavior/Discharge Planning: Goal: Ability to manage health-related needs will improve Outcome: Not Progressing   Patient remains nearly non-verbal with incomprehensive speech. Patient intermittently follows commands. Hard to assess how much education patient is able to understand. Patient has order for cortrak placement to begin feeding and help with med admin.

## 2022-09-12 NOTE — Progress Notes (Signed)
[facility]  PROGRESS NOTE  Jacob Bass YQM:[id number] DOB: 09-07-1960 DOA: 09/10/2022 PCP: Donia Ast., MD   Brief HPI:   62 year old male with medical history of stroke, hypertension, COPD, anemia, cirrhosis presented with altered mental status and weakness. Patient resides at [facility] and around 10 PM he was found to be more quite and not responding to questions appropriately. In the ED COVID and RSV were negative.  Ammonia level 99.  CT head showed no acute abnormality.  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  Also showed moderate focal stenosis of the right P1 left P2.  MRI of the brain was positive for acute infarct.  Neurology was consulted for acute stroke workup.    Subjective   Patient seen and examined, no new complaints.  Having episodes of hypoglycemia.   Assessment/Plan:     Acute CVA -Known history of prior CVA with left-sided deficits -At facility he was found not to be responding with worsening left side deficits-MRI brain was positive for acute stroke -Neurology was consulted -Patient started on aspirin only, no Plavix given due to thrombocytopenia -Echocardiogram showed EF of 55 to 60% -Start Lipitor once patient's LFTs improve  Acute kidney injury -Baseline creatinine around 1 -Presented with creatinine of 2.08 -Started on IV fluids, also given 1 dose of IV albumin -Creatinine improved to 1.77  COPD -Continue Spiriva and Advair -Continue scheduled DuoNebs  Hypertension  -Holding amlodipine, Lasix, lisinopril, metoprolol in setting of permissive hypertension  Liver cirrhosis -Ammonia level 99 -Patient takes lactulose and Lasix at home -Has worsening edema since October -Likely from hypoalbuminemia with albumin of 1.7 -Continue lactulose. Lasix on hold in setting of stroke and AKI -Also got IV albumin -As patient is n.p.o., will discontinue p.o. lactulose, start lactulose enema 200 g twice  daily -Check ammonia level in a.m.  Hypoglycemia -Patient n.p.o. due to dysphagia -Start D5 half-normal saline at 50 mill per hour  Dysphagia -Patient failed swallow evaluation -Currently n.p.o. -If no improvement, will have to get palliative care involved for goals of care discussion  Left lower lobe retrocardiac consolidation/collapse -Clinically ,does not appear to have pneumonia -Procalcitonin 0.10, 0.12 -Will not initiate antibiotics  Thrombocytopenia -Chronic, in setting of liver cirrhosis  Medications     aspirin EC  81 mg Oral Daily   heparin injection (subcutaneous)  5,000 Units Subcutaneous Q8H   ipratropium-albuterol  3 mL Nebulization Q4H   lactulose  30 g Oral TID   mometasone-formoterol  2 puff Inhalation BID   pantoprazole  40 mg Oral BID   tamsulosin  0.4 mg Oral Daily   umeclidinium bromide  1 puff Inhalation Daily     Data Reviewed:   CBG:  Recent Labs  Lab 09/10/22 1007 09/11/22 2331 09/12/22 0631 09/12/22 0731  GLUCAP 75 83 50* 96    SpO2: 100 %    Vitals:   09/11/22 2323 09/12/22 0326 09/12/22 0458 09/12/22 0849  BP: 123/83 (!) 132/100  (!) 142/80  Pulse: (!) 108 99  95  Resp:    (!) 22  Temp: 98 F (36.7 C) 98.3 F (36.8 C)    TempSrc: Oral Oral    SpO2: (!) 73% 98% 95% 100%  Weight:      Height:          Data Reviewed:  Basic Metabolic Panel: Recent Labs  Lab 09/10/22 1000 09/11/22 1708  NA 139 142  K 4.0 3.8  CL 110 111  CO2 21*  21*  GLUCOSE 95 52*  BUN 23 22  CREATININE 2.09* 1.77*  CALCIUM 7.8* 8.1*    CBC: Recent Labs  Lab 09/10/22 1000 09/11/22 1708  WBC 4.1 5.1  NEUTROABS 2.2  --   HGB 8.8* 8.5*  HCT 25.8* 25.8*  MCV 100.8* 100.0  PLT 85* 83*    LFT Recent Labs  Lab 09/10/22 1000  AST 113*  ALT 64*  ALKPHOS 67  BILITOT 1.8*  PROT 6.7  ALBUMIN 1.7*     Antibiotics: Anti-infectives (From admission, onward)    Start     Dose/Rate Route Frequency Ordered Stop   09/10/22 1345   cefTRIAXone (ROCEPHIN) 1 g in sodium chloride 0.9 % 100 mL IVPB        1 g 200 mL/hr over 30 Minutes Intravenous  Once 09/10/22 1331 09/10/22 1605   09/10/22 1345  azithromycin (ZITHROMAX) 500 mg in sodium chloride 0.9 % 250 mL IVPB        500 mg 250 mL/hr over 60 Minutes Intravenous  Once 09/10/22 1331 09/10/22 1605        DVT prophylaxis: SCDs  Code Status: Full code  Family Communication:    CONSULTS neurology   Objective    Physical Examination:  Appears in no acute distress Neuro-left upper and left lower extremity weakness, oriented x 2 -Abdomen is soft, nontender, no organomegaly -Extremities no edema   Status is: Inpatient:          Meredeth Ide   [facility] If 7PM-7AM, please contact night-coverage at [email or web address], Office  [phone]   09/12/2022, 8:52 AM  LOS: 1 day

## 2022-09-13 ENCOUNTER — Inpatient Hospital Stay (HOSPITAL_COMMUNITY): Payer: Medicaid Other

## 2022-09-13 DIAGNOSIS — I639 Cerebral infarction, unspecified: Secondary | ICD-10-CM | POA: Diagnosis not present

## 2022-09-13 DIAGNOSIS — K7460 Unspecified cirrhosis of liver: Secondary | ICD-10-CM | POA: Diagnosis not present

## 2022-09-13 DIAGNOSIS — D649 Anemia, unspecified: Secondary | ICD-10-CM | POA: Diagnosis not present

## 2022-09-13 LAB — GLUCOSE, CAPILLARY
Glucose-Capillary: 73 mg/dL (ref 70–99)
Glucose-Capillary: 74 mg/dL (ref 70–99)
Glucose-Capillary: 78 mg/dL (ref 70–99)
Glucose-Capillary: 80 mg/dL (ref 70–99)
Glucose-Capillary: 86 mg/dL (ref 70–99)
Glucose-Capillary: 94 mg/dL (ref 70–99)
Glucose-Capillary: 94 mg/dL (ref 70–99)

## 2022-09-13 LAB — COMPREHENSIVE METABOLIC PANEL
ALT: 53 U/L — ABNORMAL HIGH (ref 0–44)
AST: 89 U/L — ABNORMAL HIGH (ref 15–41)
Albumin: 1.7 g/dL — ABNORMAL LOW (ref 3.5–5.0)
Alkaline Phosphatase: 62 U/L (ref 38–126)
Anion gap: 7 (ref 5–15)
BUN: 15 mg/dL (ref 8–23)
CO2: 21 mmol/L — ABNORMAL LOW (ref 22–32)
Calcium: 7.7 mg/dL — ABNORMAL LOW (ref 8.9–10.3)
Chloride: 118 mmol/L — ABNORMAL HIGH (ref 98–111)
Creatinine, Ser: 1.47 mg/dL — ABNORMAL HIGH (ref 0.61–1.24)
GFR, Estimated: 53 mL/min — ABNORMAL LOW (ref >60)
Glucose, Bld: 81 mg/dL (ref 70–99)
Potassium: 3.3 mmol/L — ABNORMAL LOW (ref 3.5–5.1)
Sodium: 146 mmol/L — ABNORMAL HIGH (ref 135–145)
Total Bilirubin: 1.6 mg/dL — ABNORMAL HIGH (ref 0.3–1.2)
Total Protein: 5.9 g/dL — ABNORMAL LOW (ref 6.5–8.1)

## 2022-09-13 LAB — AMMONIA: Ammonia: 57 umol/L — ABNORMAL HIGH (ref 9–35)

## 2022-09-13 MED ORDER — DEXTROSE 10 % IV SOLN: INTRAVENOUS | Status: AC

## 2022-09-13 MED ORDER — POTASSIUM CHLORIDE 10 MEQ/100ML IV SOLN
10.0000 meq | Freq: Once | INTRAVENOUS | Status: AC
  Administered 2022-09-13: 10 meq via INTRAVENOUS
  Filled 2022-09-13: qty 100

## 2022-09-13 NOTE — Plan of Care (Signed)
  Problem: Ischemic Stroke/TIA Tissue Perfusion: Goal: Complications of ischemic stroke/TIA will be minimized Outcome: Progressing   Problem: Self-Care: Goal: Ability to participate in self-care as condition permits will improve Outcome: Progressing   Problem: Clinical Measurements: Goal: Ability to maintain clinical measurements within normal limits will improve Outcome: Progressing Goal: Will remain free from infection Outcome: Progressing Goal: Diagnostic test results will improve Outcome: Progressing Goal: Respiratory complications will improve Outcome: Progressing Goal: Cardiovascular complication will be avoided Outcome: Progressing   Problem: Coping: Goal: Level of anxiety will decrease Outcome: Progressing   Problem: Elimination: Goal: Will not experience complications related to bowel motility Outcome: Progressing Goal: Will not experience complications related to urinary retention Outcome: Progressing   Problem: Pain Managment: Goal: General experience of comfort will improve Outcome: Progressing   Problem: Safety: Goal: Ability to remain free from injury will improve Outcome: Progressing   Problem: Skin Integrity: Goal: Risk for impaired skin integrity will decrease Outcome: Progressing   Problem: Education: Goal: Knowledge of disease or condition will improve Outcome: Not Progressing Goal: Knowledge of secondary prevention will improve (MUST DOCUMENT ALL) Outcome: Not Progressing Goal: Knowledge of patient specific risk factors will improve [place] N/A or DELETE if not current risk factor) Outcome: Not Progressing   Problem: Coping: Goal: Will verbalize positive feelings about self Outcome: Not Progressing Goal: Will identify appropriate support needs Outcome: Not Progressing   Problem: Health Behavior/Discharge Planning: Goal: Ability to manage health-related needs will improve Outcome: Not Progressing Goal: Goals will be collaboratively  established with patient/family Outcome: Not Progressing   Problem: Self-Care: Goal: Verbalization of feelings and concerns over difficulty with self-care will improve Outcome: Not Progressing Goal: Ability to communicate needs accurately will improve Outcome: Not Progressing   Problem: Nutrition: Goal: Risk of aspiration will decrease Outcome: Not Progressing Goal: Dietary intake will improve Outcome: Not Progressing   Problem: Education: Goal: Knowledge of General Education information will improve Description: Including pain rating scale, medication(s)/side effects and non-pharmacologic comfort measures Outcome: Not Progressing   Problem: Health Behavior/Discharge Planning: Goal: Ability to manage health-related needs will improve Outcome: Not Progressing   Problem: Activity: Goal: Risk for activity intolerance will decrease Outcome: Not Progressing   Problem: Nutrition: Goal: Adequate nutrition will be maintained Outcome: Not Progressing

## 2022-09-13 NOTE — Progress Notes (Signed)
Initial Nutrition Assessment  DOCUMENTATION CODES:   Non-severe (moderate) malnutrition in context of chronic illness  INTERVENTION:  Initiate tube feeding via cortrak once in place: Jevity 1.5 at 60 ml/h (1440 ml per day) Start at 32mL/h and advance by 55mL q8h Prosource TF20 60 ml 1x/d Free water 144mL q4h Provides 2060 kcal, 104 gm protein, 1003 ml free water daily (1945mL TF+flush) Pt at risk for refeeding monitor electrolytes and advance slowly. MD to replace if low. 100mg  of thiamine x 10 days for refeeding and malnutrition  NUTRITION DIAGNOSIS:  Moderate Malnutrition related to chronic illness as evidenced by moderate fat depletion, severe muscle depletion.  GOAL:  Patient will meet greater than or equal to 90% of their needs  MONITOR:  Diet advancement, Labs, I & O's, TF tolerance  REASON FOR ASSESSMENT:  New TF (on cortrak list)    ASSESSMENT:  Pt with hx of HTN, COPD, cirrhosis, hx of etOH abuse, dementia, and prior CVA presented to ED from his nursing facility with AMS and weakness. Found to have suffered an acute infarct.   1/9 - MBS, NPO  Pt resting in bed at the time of assessment. Awake and alert but unable to answer questions or provide a nutrition hx. Reviewed SLP evaluation and there is concern that pt may not recover his swallowing function due to poor status at baseline. APS has been contacted for guardianship as pt cannot speak and does not have any family.   Spoke with RN, aware that cortrak will not be placed until tomorrow. Pt currently on dextrose containing IVF. Muscle and fat deficits noted on exam consistent with long-term under nourishment.  Nutritionally Relevant Medications: Scheduled Meds:  pantoprazole (PROTONIX) IV  40 mg Intravenous Q24H   Continuous Infusions:  dextrose 50 mL/hr at 09/13/22 1015   PRN Meds: senna-docusate  Labs Reviewed: Na 146, chloride 118 K 3.3 Creatinine 1.47  NUTRITION - FOCUSED PHYSICAL EXAM: Flowsheet Row  Most Recent Value  Orbital Region Mild depletion  Upper Arm Region Moderate depletion  Thoracic and Lumbar Region Moderate depletion  Buccal Region Mild depletion  Temple Region Mild depletion  Clavicle Bone Region Mild depletion  Clavicle and Acromion Bone Region Severe depletion  Scapular Bone Region Moderate depletion  Dorsal Hand Severe depletion  Patellar Region No depletion  Anterior Thigh Region No depletion  Posterior Calf Region No depletion  Edema (RD Assessment) Moderate  [significant edema to the BLE]  Hair Reviewed  Eyes Reviewed  Mouth Reviewed  Skin Reviewed  Nails Reviewed     Diet Order:   Diet Order             Diet NPO time specified  Diet effective now                   EDUCATION NEEDS:  Not appropriate for education at this time  Skin:  Skin Assessment: Reviewed RN Assessment  Last BM:  1/11 - type 7  Height:  Ht Readings from Last 1 Encounters:  09/10/22 5\' 7"  (1.702 m)    Weight:  Wt Readings from Last 1 Encounters:  09/10/22 74.8 kg    Ideal Body Weight:  67.3 kg  BMI:  Body mass index is 25.84 kg/m.  Estimated Nutritional Needs:  Kcal:  1900-2100 kcal/d Protein:  90-105g/d Fluid:  >/=2L/d    Ranell Patrick, RD, LDN Clinical Dietitian RD pager # available in [facility]  After hours/weekend pager # available in [facility]

## 2022-09-13 NOTE — Plan of Care (Signed)

## 2022-09-13 NOTE — TOC Progression Note (Signed)
Transition of Care [facility]) - Progression Note    Patient Details  Name: Jacob Bass MRN: [id number] Date of Birth: [birth year]  [facility] CM/SW Contact  Jinger Neighbors, [place] Phone Number: 09/13/2022, 11:59 AM  Clinical Narrative:     CSW left a voicemail for facility Social Worker to inquire about any natural supports for pt and potential APS referral.        Expected Discharge Plan and Services                                               Social Determinants of Health (SDOH) Interventions    Readmission Risk Interventions     No data to display

## 2022-09-13 NOTE — Progress Notes (Signed)
[facility]  PROGRESS NOTE  Jacob Bass GNF:[id number] DOB: 02/24/1961 DOA: 09/10/2022 PCP: Donia Ast., MD   Brief HPI:   62 year old male with medical history of stroke, hypertension, COPD, anemia, cirrhosis presented with altered mental status and weakness. Patient resides at [facility] and around 10 PM he was found to be more quite and not responding to questions appropriately. In the ED COVID and RSV were negative.  Ammonia level 99.  CT head showed no acute abnormality.  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  Also showed moderate focal stenosis of the right P1 left P2.  MRI of the brain was positive for acute infarct.  Neurology was consulted for acute stroke workup.    Subjective   Patient seen and examined, denies abdominal pain.  Blood glucose has been dropping.  Failed swallow evaluation.  Currently n.p.o.   Assessment/Plan:     Acute CVA -Known history of prior CVA with left-sided deficits -At facility he was found not to be responding with worsening left side deficits-MRI brain was positive for acute stroke -Neurology was consulted -Patient started on aspirin only, no Plavix given due to thrombocytopenia -Echocardiogram showed EF of 55 to 60% -Start Lipitor once patient's LFTs improve  Acute kidney injury -Baseline creatinine around 1 -Presented with creatinine of 2.08 -Started on IV fluids, also given 1 dose of IV albumin -Creatinine improved to 1.47  COPD -Continue Spiriva and Advair -Continue scheduled DuoNebs  Hypertension  -Holding amlodipine, Lasix, lisinopril, metoprolol in setting of permissive hypertension  Liver cirrhosis -Ammonia level 99 -Patient takes lactulose and Lasix at home -Has worsening edema since October -Likely from hypoalbuminemia with albumin of 1.7 -Continue lactulose. Lasix on hold in setting of stroke and AKI -Also got IV albumin -As patient is n.p.o., will discontinue  p.o. lactulose, start lactulose enema 200 g twice daily -Check ammonia level in a.m.  Hypoglycemia -Patient n.p.o. due to dysphagia -Start D10W at 50 mill per hour  Hypokalemia -Potassium is 3.3 -Replace with KCl 10 meq IV x 1  Abdominal distention -Has mild abdominal distention, nontender to palpation -KUB obtained showed nonobstructive bowel gas pattern -Had 1 large bowel movement last night  Dysphagia -Patient failed swallow evaluation -Will not be able to start back on p.o. diet as per speech therapy -Will consult palliative care for further goals of care discussion -Continue n.p.o.  Left lower lobe retrocardiac consolidation/collapse -Clinically ,does not appear to have pneumonia -Procalcitonin 0.10, 0.12 -Will not initiate antibiotics  Thrombocytopenia -Chronic, in setting of liver cirrhosis  Goals of care -Patient has poor gnosis with very poor p.o. intake, failed swallow evaluation.  I do not think he is a candidate for PEG tube, but will get palliative care consultation for further clarification of goals of care.  No family member listed in epic.  Medications     aspirin  300 mg Rectal Daily   heparin injection (subcutaneous)  5,000 Units Subcutaneous Q8H   mometasone-formoterol  2 puff Inhalation BID   pantoprazole (PROTONIX) IV  40 mg Intravenous Q24H   tamsulosin  0.4 mg Oral Daily   umeclidinium bromide  1 puff Inhalation Daily     Data Reviewed:   CBG:  Recent Labs  Lab 09/12/22 2111 09/13/22 0022 09/13/22 0409 09/13/22 0814 09/13/22 1121  GLUCAP 70 73 74 78 80    SpO2: 100 %    Vitals:   09/12/22 1943 09/13/22 0024 09/13/22 0758 09/13/22 1120  BP: (!) 150/94 Marland Kitchen)  181/98 (!) 158/76 (!) 148/91  Pulse: (!) 50 95 81 83  Resp: 20 18 18 17   Temp: 98.7 F (37.1 C) 98.9 F (37.2 C) 98.4 F (36.9 C) 98.2 F (36.8 C)  TempSrc: Oral  Oral Oral  SpO2: 93% 99% 100% 100%  Weight:      Height:          Data Reviewed:  Basic Metabolic  Panel: Recent Labs  Lab 09/10/22 1000 09/11/22 1708 09/13/22 0411  NA 139 142 146*  K 4.0 3.8 3.3*  CL 110 111 118*  CO2 21* 21* 21*  GLUCOSE 95 52* 81  BUN 23 22 15   CREATININE 2.09* 1.77* 1.47*  CALCIUM 7.8* 8.1* 7.7*    CBC: Recent Labs  Lab 09/10/22 1000 09/11/22 1708  WBC 4.1 5.1  NEUTROABS 2.2  --   HGB 8.8* 8.5*  HCT 25.8* 25.8*  MCV 100.8* 100.0  PLT 85* 83*    LFT Recent Labs  Lab 09/10/22 1000 09/13/22 0411  AST 113* 89*  ALT 64* 53*  ALKPHOS 67 62  BILITOT 1.8* 1.6*  PROT 6.7 5.9*  ALBUMIN 1.7* 1.7*     Antibiotics: Anti-infectives (From admission, onward)    Start     Dose/Rate Route Frequency Ordered Stop   09/10/22 1345  cefTRIAXone (ROCEPHIN) 1 g in sodium chloride 0.9 % 100 mL IVPB        1 g 200 mL/hr over 30 Minutes Intravenous  Once 09/10/22 1331 09/10/22 1605   09/10/22 1345  azithromycin (ZITHROMAX) 500 mg in sodium chloride 0.9 % 250 mL IVPB        500 mg 250 mL/hr over 60 Minutes Intravenous  Once 09/10/22 1331 09/10/22 1605        DVT prophylaxis: SCDs  Code Status: Full code  Family Communication:    CONSULTS neurology   Objective    Physical Examination:  Appears in no acute distress S1-S2, regular Lungs are clear to auscultation bilaterally Abdomen mild guarding, mild distention, no tenderness on palpation   Status is: Inpatient:          Meredeth Ide   [facility] If 7PM-7AM, please contact night-coverage at [email or web address], Office  [phone]   09/13/2022, 12:00 PM  LOS: 2 days

## 2022-09-13 NOTE — TOC Progression Note (Addendum)
Transition of Care [facility]) - Progression Note    Patient Details  Name: Jacob Bass MRN: [id number] Date of Birth: 1961-01-26  Transition of Care [facility]) CM/SW Contact  Jinger Neighbors, [place] Phone Number: 09/13/2022, 1:14 PM  Clinical Narrative:     CSW consulted with Ms. Radford Pax, facility Education officer, museum at [employer]. Ms. Radford Pax reports they filed a petition with DSS for DSS to take guardianship. DSS was guardian for 2-3 months and withdrew guardianship because pt began talking again. CSW filed an [place] report with [place] due to pt not having any natural supports or professional supports to make decisions on pt's behalf.  CSW received a returned call from [place] stating a Education officer, museum has been assigned.       Expected Discharge Plan and Services                                               Social Determinants of Health (SDOH) Interventions    Readmission Risk Interventions     No data to display

## 2022-09-14 ENCOUNTER — Inpatient Hospital Stay (HOSPITAL_COMMUNITY): Payer: Medicaid Other

## 2022-09-14 DIAGNOSIS — K7460 Unspecified cirrhosis of liver: Secondary | ICD-10-CM | POA: Diagnosis not present

## 2022-09-14 DIAGNOSIS — D649 Anemia, unspecified: Secondary | ICD-10-CM | POA: Diagnosis not present

## 2022-09-14 DIAGNOSIS — I639 Cerebral infarction, unspecified: Secondary | ICD-10-CM | POA: Diagnosis not present

## 2022-09-14 DIAGNOSIS — E440 Moderate protein-calorie malnutrition: Secondary | ICD-10-CM | POA: Insufficient documentation

## 2022-09-14 LAB — GLUCOSE, CAPILLARY
Glucose-Capillary: 79 mg/dL (ref 70–99)
Glucose-Capillary: 99 mg/dL (ref 70–99)
Glucose-Capillary: 88 mg/dL (ref 70–99)
Glucose-Capillary: 73 mg/dL (ref 70–99)
Glucose-Capillary: 85 mg/dL (ref 70–99)

## 2022-09-14 LAB — PHOSPHORUS: Phosphorus: 3 mg/dL (ref 2.5–4.6)

## 2022-09-14 LAB — MAGNESIUM: Magnesium: 1.7 mg/dL (ref 1.7–2.4)

## 2022-09-14 MED ORDER — PROSOURCE TF20 ENFIT COMPATIBL EN LIQD
60.0000 mL | Freq: Every day | ENTERAL | Status: DC
  Administered 2022-09-14 – 2022-10-16 (×32): 60 mL
  Filled 2022-09-14 (×32): qty 60

## 2022-09-14 MED ORDER — FREE WATER
150.0000 mL | Status: DC
  Administered 2022-09-14 – 2022-09-16 (×10): 150 mL

## 2022-09-14 MED ORDER — THIAMINE MONONITRATE 100 MG PO TABS
100.0000 mg | ORAL_TABLET | Freq: Every day | ORAL | Status: AC
  Administered 2022-09-14 – 2022-09-23 (×10): 100 mg
  Filled 2022-09-14 (×10): qty 1

## 2022-09-14 MED ORDER — JEVITY 1.5 CAL/FIBER PO LIQD
1000.0000 mL | ORAL | Status: DC
  Administered 2022-09-14 – 2022-10-02 (×17): 1000 mL
  Filled 2022-09-14 (×33): qty 1000

## 2022-09-14 NOTE — Progress Notes (Signed)
[facility]  PROGRESS NOTE  Daviel Alfonse Ras WUJ:[id number] DOB: [birth year] DOA: 09/10/2022 PCP: Donia Ast., MD   Brief HPI:   62 year old male with medical history of stroke, hypertension, COPD, anemia, cirrhosis presented with altered mental status and weakness. Patient resides at [facility] and around 10 PM he was found to be more quite and not responding to questions appropriately. In the ED COVID and RSV were negative.  Ammonia level 99.  CT head showed no acute abnormality.  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  Also showed moderate focal stenosis of the right P1 left P2.  MRI of the brain was positive for acute infarct.  Neurology was consulted for acute stroke workup.    Subjective   Patient seen and examined, no new complaints.  Speech therapy has started him on ice chips.   Assessment/Plan:     Acute CVA -Known history of prior CVA with left-sided deficits -At facility he was found not to be responding with worsening left side deficits-MRI brain was positive for acute stroke -Neurology was consulted -Patient started on aspirin only, no Plavix given due to thrombocytopenia -Echocardiogram showed EF of 55 to 60% -Start Lipitor once patient's LFTs improve  Acute kidney injury -Baseline creatinine around 1 -Presented with creatinine of 2.08 -Started on IV fluids, also given 1 dose of IV albumin -Creatinine improved to 1.47  COPD -Continue Spiriva and Advair -Continue scheduled DuoNebs  Hypertension  -Holding amlodipine, Lasix, lisinopril, metoprolol in setting of permissive hypertension  Liver cirrhosis -Ammonia level 99 -Patient takes lactulose and Lasix at home -Has worsening edema since October -Likely from hypoalbuminemia with albumin of 1.7 -Continue lactulose. Lasix on hold in setting of stroke and AKI -Also got IV albumin -As patient is n.p.o., will discontinue p.o. lactulose, start lactulose enema  200 g twice daily -Check ammonia level in a.m.  Hypoglycemia -Patient n.p.o. due to dysphagia -Start D10W at 50 mill per hour  Hypokalemia -Potassium is 3.3 -Replace with KCl 10 meq IV x 1 -Follow BMP in am  Abdominal distention -Has mild abdominal distention, nontender to palpation -KUB obtained showed nonobstructive bowel gas pattern -Had 1 large bowel movement on 09/12/2022  Dysphagia -Patient failed swallow evaluation -Will not be able to start back on p.o. diet as per speech therapy -Will consult palliative care for further goals of care discussion -Continue n.p.o.  Left lower lobe retrocardiac consolidation/collapse -Clinically ,does not appear to have pneumonia -Procalcitonin 0.10, 0.12 -Will not initiate antibiotics  Thrombocytopenia -Chronic, in setting of liver cirrhosis  Goals of care -Patient has poor gnosis with very poor p.o. intake, failed swallow evaluation.  I do not think he is a candidate for PEG tube, but will get palliative care consultation for further clarification of goals of care.  No family member listed in epic.  Medications     aspirin  300 mg Rectal Daily   heparin injection (subcutaneous)  5,000 Units Subcutaneous Q8H   mometasone-formoterol  2 puff Inhalation BID   pantoprazole (PROTONIX) IV  40 mg Intravenous Q24H   tamsulosin  0.4 mg Oral Daily   umeclidinium bromide  1 puff Inhalation Daily     Data Reviewed:   CBG:  Recent Labs  Lab 09/13/22 1526 09/13/22 1943 09/13/22 2345 09/14/22 0355 09/14/22 0811  GLUCAP 94 94 86 79 99    SpO2: 94 %    Vitals:   09/13/22 1948 09/13/22 2343 09/14/22 0354 09/14/22 0811  BP:  (!) 156/110  120/87 (!) 146/81  Pulse: 74 75 76 (!) 110  Resp:  16 17 18   Temp:  98.6 F (37 C) 98 F (36.7 C) 98.2 F (36.8 C)  TempSrc:  Oral Oral   SpO2: 100% 100% 97% 94%  Weight:      Height:          Data Reviewed:  Basic Metabolic Panel: Recent Labs  Lab 09/10/22 1000 09/11/22 1708  09/13/22 0411  NA 139 142 146*  K 4.0 3.8 3.3*  CL 110 111 118*  CO2 21* 21* 21*  GLUCOSE 95 52* 81  BUN 23 22 15   CREATININE 2.09* 1.77* 1.47*  CALCIUM 7.8* 8.1* 7.7*    CBC: Recent Labs  Lab 09/10/22 1000 09/11/22 1708  WBC 4.1 5.1  NEUTROABS 2.2  --   HGB 8.8* 8.5*  HCT 25.8* 25.8*  MCV 100.8* 100.0  PLT 85* 83*    LFT Recent Labs  Lab 09/10/22 1000 09/13/22 0411  AST 113* 89*  ALT 64* 53*  ALKPHOS 67 62  BILITOT 1.8* 1.6*  PROT 6.7 5.9*  ALBUMIN 1.7* 1.7*     Antibiotics: Anti-infectives (From admission, onward)    Start     Dose/Rate Route Frequency Ordered Stop   09/10/22 1345  cefTRIAXone (ROCEPHIN) 1 g in sodium chloride 0.9 % 100 mL IVPB        1 g 200 mL/hr over 30 Minutes Intravenous  Once 09/10/22 1331 09/10/22 1605   09/10/22 1345  azithromycin (ZITHROMAX) 500 mg in sodium chloride 0.9 % 250 mL IVPB        500 mg 250 mL/hr over 60 Minutes Intravenous  Once 09/10/22 1331 09/10/22 1605        DVT prophylaxis: SCDs  Code Status: Full code  Family Communication:    CONSULTS neurology   Objective    Physical Examination:  Appears in no acute distress S1-S2, regular Clear to auscultation bilaterally Abdomen is soft, mildly distended   Status is: Inpatient:          Meredeth Ide   [facility] If 7PM-7AM, please contact night-coverage at [email or web address], Office  [phone]   09/14/2022, 10:41 AM  LOS: 3 days

## 2022-09-14 NOTE — Progress Notes (Signed)
Occupational Therapy Treatment Patient Details Name: Jacob Bass MRN: [id number] DOB: June 07, 1961 Today's Date: 09/14/2022   History of present illness 62 y.o. male presents to [facility] hospital on 09/10/2022 with AMS and L weakness from [facility]. 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 PMH includes CVA, COPD, cirrhosis, gastric ulcer.   OT comments  Pt making incremental progress towards OT goals with focus on EOB ADLs and initial OOB transfers. Pt able to perform oral care to remove dried skin from lips and oral residue with Min A, able to indicate care needs during this task. Pt requires Max A x 2 for squat pivot OOB d/t poor LE strength and significant edema (BLE, abdomen). Encouraged continued L UE ROM w/ R UE assist and elevated LUE at end of session. Pt with wheezing sounds throughout session. Continue to rec DC back to SNF with therapy follow up as needed.   Recommendations for follow up therapy are one component of a multi-disciplinary discharge planning process, led by the attending physician.  Recommendations may be updated based on patient status, additional functional criteria and insurance authorization.    Follow Up Recommendations  Skilled nursing-short term rehab (<3 hours/day)     Assistance Recommended at Discharge Frequent or constant Supervision/Assistance  Patient can return home with the following  Two people to help with walking and/or transfers;Two people to help with bathing/dressing/bathroom   Equipment Recommendations  None recommended by OT    Recommendations for Other Services      Precautions / Restrictions Precautions Precautions: Fall Restrictions Weight Bearing Restrictions: No       Mobility Bed Mobility Overal bed mobility: Needs Assistance Bed Mobility: Supine to Sit     Supine to sit: Mod assist, HOB elevated     General bed mobility comments: able to lift trunk fairly well once assisted LEs to EOB    Transfers Overall transfer level:  Needs assistance Equipment used: None Transfers: Bed to chair/wheelchair/BSC, Sit to/from Stand Sit to Stand: Max assist, +2 safety/equipment, +2 physical assistance   Squat pivot transfers: Max assist, +2 physical assistance, +2 safety/equipment       General transfer comment: Max A x 2 for initial stand at bedside, seated rest break before squat pivot to drop arm recliner to R side w/ Max A x 2 needed and noted difficulty advancing R foot     Balance Overall balance assessment: Needs assistance Sitting-balance support: Single extremity supported, Feet supported Sitting balance-Leahy Scale: Fair     Standing balance support: Bilateral upper extremity supported Standing balance-Leahy Scale: Zero                             ADL either performed or assessed with clinical judgement   ADL Overall ADL's : Needs assistance/impaired     Grooming: Minimal assistance;Sitting;Oral care Grooming Details (indicate cue type and reason): provided wet toothbrush with cues/assist to initiate and place in mouth with pt able to complete task and dipping in water to rinse. Min A to wipe dry skin off of mouth, pt able to suction self. opened mouth moisturizer for pt and able to reach out with RUE to hold and place on mouth             Lower Body Dressing: Total assistance;Bed level Lower Body Dressing Details (indicate cue type and reason): sock mgmt               General ADL Comments: Focus on  EOB ADLs and initial OOB transfer to improve pulmonary function (noted wheezing)    Extremity/Trunk Assessment Upper Extremity Assessment Upper Extremity Assessment: LUE deficits/detail LUE Deficits / Details: profound weakness w/ hx of CVA. improved tone in hands though some tone with elbow ROM. elevated on pillow LUE Coordination: decreased fine motor;decreased gross motor   Lower Extremity Assessment Lower Extremity Assessment: Defer to PT evaluation        Vision   Vision  Assessment?: Vision impaired- to be further tested in functional context Additional Comments: some questionable L inattention but improving   Perception     Praxis      Cognition Arousal/Alertness: Awake/alert Behavior During Therapy: WFL for tasks assessed/performed, Flat affect Overall Cognitive Status: No family/caregiver present to determine baseline cognitive functioning                                 General Comments: pt follows commands well with increased time, inconsistent reponses to questions at times, does respond to humor well and able to express needs fairly well        Exercises      Shoulder Instructions       General Comments      Pertinent Vitals/ Pain       Pain Assessment Pain Assessment: Faces Faces Pain Scale: No hurt Pain Intervention(s): Monitored during session  Home Living                                          Prior Functioning/Environment              Frequency  Min 2X/week        Progress Toward Goals  OT Goals(current goals can now be found in the care plan section)  Progress towards OT goals: Progressing toward goals  Acute Rehab OT Goals Patient Stated Goal: none stated OT Goal Formulation: Patient unable to participate in goal setting Time For Goal Achievement: 09/25/22 Potential to Achieve Goals: Fair ADL Goals Pt Will Perform Grooming: with set-up;bed level Pt Will Perform Upper Body Bathing: with min assist;bed level;sitting Pt/caregiver will Perform Home Exercise Program: Increased ROM;Left upper extremity;Increased strength;With Supervision Additional ADL Goal #1: Pt to demo bed mobility with Min A in prep for functional tasks EOB  Plan Discharge plan remains appropriate    Co-evaluation    PT/OT/SLP Co-Evaluation/Treatment: Yes Reason for Co-Treatment: For patient/therapist safety;To address functional/ADL transfers   OT goals addressed during session: ADL's and  self-care;Strengthening/ROM      AM-PAC OT "6 Clicks" Daily Activity     Outcome Measure   Help from another person eating meals?: Total Help from another person taking care of personal grooming?: A Little Help from another person toileting, which includes using toliet, bedpan, or urinal?: Total Help from another person bathing (including washing, rinsing, drying)?: A Lot Help from another person to put on and taking off regular upper body clothing?: A Lot Help from another person to put on and taking off regular lower body clothing?: Total 6 Click Score: 10    End of Session Equipment Utilized During Treatment: Gait belt  OT Visit Diagnosis: Unsteadiness on feet (R26.81);Other abnormalities of gait and mobility (R26.89);Muscle weakness (generalized) (M62.81)   Activity Tolerance Patient tolerated treatment well   Patient Left in chair;with call bell/phone within reach;with chair alarm set  Nurse Communication Mobility status;Need for lift equipment        Time: [phone] OT Time Calculation (min): 24 min  Charges: OT General Charges $OT Visit: 1 Visit OT Treatments $Self Care/Home Management : 8-22 mins  Bradd Canary, OTR/L Acute Rehab Services Office: [phone]   Lorre Munroe 09/14/2022, 8:59 AM

## 2022-09-14 NOTE — Progress Notes (Signed)
Brief Nutrition Support Note  Pt remains NPO at this time. Cortrak tube placed this AM for medication and nutrition administration. Discussed with RN and MD, ok to start TF.  Pt at risk for refeeding monitor electrolytes and advance slowly. MD to replace if low.  Initiate tube feeding via cortrak once in place: Jevity 1.5 at 60 ml/h (1440 ml per day) Start at 68mL/h and advance by 27mL q8h Prosource TF20 60 ml 1x/d Free water 161mL q4h Provides 2060 kcal, 104 gm protein, 1003 ml free water daily (1969mL TF+flush)  100mg  of thiamine x 10 days for risk of refeeding and dx of malnutrition  Ranell Patrick, RD, LDN Clinical Dietitian RD pager # available in [place]  After hours/weekend pager # available in [facility]

## 2022-09-14 NOTE — Progress Notes (Signed)
Speech Language Pathology Treatment: Dysphagia  Patient Details Name: Jacob Bass MRN: [id number] DOB: 07-25-1961 Today's Date: 09/14/2022 Time: 3570-1779 SLP Time Calculation (min) (ACUTE ONLY): 15 min  Assessment / Plan / Recommendation Clinical Impression  Pt seen at bedside for skilled ST intervention targeting goal for PO readiness. Pt was awake and alert, seated upright in recliner. Pt had just received inhaler treatment with RT, and was noted to be wheezing throughout this session.  Pt completed oral care with suction after set up. He appeared to tolerate this well. Oral cavity was pink and healthy, with an abundance of saliva. Pt was able to follow commands to protrude tongue - right deviation noted. Lateralization was very limited, as was lifting and lowering the tongue tip. Cued cough resulted in throat clear. Pt was unable to cough to command.   Pt then accepted ice chips x3, with at least 2 swallows per presentation. Extended oral prep and intermittent right anterior leakage was noted. No cough response elicited, however, pt exhibited silent aspiration of thin liquids on 1/9 MBS. Throat clearing was noted during final ice chip, but no reflexive cough. Pt declined further PO trials at this time. Recommend continuing with NPO status. Anticipate pt being able to tolerate ice chips after oral care and with supervision soon.    HPI HPI: Jacob Bass is a 62 y.o. male who presented to ED from [facility] via EMS with left sided weakness, speech changes, wheezing. PMH/PSH includes prior CVA, subarachnoid hemorrhage, cirrhosis, hep C, hx of etOH abuse, dementia, COPD.  Pt known to SLP from prior admissions, with baseline dysarthria and oral dysphagia. He was most recently seen by SLP on 04/13/22 with recommendations for Dysphagia 1 solids and thin liquids.      SLP Plan  Continue with current plan of care      Recommendations for follow up therapy are one component of a multi-disciplinary  discharge planning process, led by the attending physician.  Recommendations may be updated based on patient status, additional functional criteria and insurance authorization.    Recommendations  Diet recommendations: NPO Medication Administration: Via alternative means                Oral Care Recommendations: Oral care QID Follow Up Recommendations: Skilled nursing-short term rehab (<3 hours/day) Assistance recommended at discharge: Frequent or constant Supervision/Assistance SLP Visit Diagnosis: Dysphagia, oropharyngeal phase (R13.12) (per MBS) Plan: Continue with current plan of care          Jacob Bass B. Jacob Bass, [facility], [place] Speech Language Pathologist Office: [phone]  Jacob Bass 09/14/2022, 9:13 AM

## 2022-09-14 NOTE — Progress Notes (Signed)
Physical Therapy Treatment Patient Details Name: Jacob Bass MRN: [id number] DOB: [birth year] Today's Date: 09/14/2022   History of Present Illness 62 y.o. male presents to [facility] hospital on 09/10/2022 with AMS and L weakness from [facility]. 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 PMH includes CVA, COPD, cirrhosis, gastric ulcer.    PT Comments    Pt making incremental progress towards acute goals this date with focus of session on initial OOB transfer. Pt able to squat pivot to recliner with max a +2 as pt with noted BLE weakness and limited by LE edema. Pt with continued fair sitting balance, able to complete seated ADLs at EOB without assist to maintain balance. Pt Les elevated at end of session and RN aware pf edema, pt educated in LE HEP for edema management with pt verbalizing understanding. Current plan remains appropriate to address deficits and maximize functional independence and decrease caregiver burden. Pt continues to benefit from skilled PT services to progress toward functional mobility goals.     Recommendations for follow up therapy are one component of a multi-disciplinary discharge planning process, led by the attending physician.  Recommendations may be updated based on patient status, additional functional criteria and insurance authorization.  Follow Up Recommendations  Skilled nursing-short term rehab (<3 hours/day) Can patient physically be transported by private vehicle: No   Assistance Recommended at Discharge Frequent or constant Supervision/Assistance  Patient can return home with the following Two people to help with walking and/or transfers;Two people to help with bathing/dressing/bathroom;Assistance with cooking/housework;Assistance with feeding;Direct supervision/assist for medications management;Direct supervision/assist for financial management;Assist for transportation;Help with stairs or ramp for entrance   Equipment Recommendations       Recommendations  for Other Services       Precautions / Restrictions Precautions Precautions: Fall Restrictions Weight Bearing Restrictions: No     Mobility  Bed Mobility Overal bed mobility: Needs Assistance Bed Mobility: Supine to Sit     Supine to sit: Mod assist, HOB elevated     General bed mobility comments: able to lift trunk fairly well once assisted LEs to EOB    Transfers Overall transfer level: Needs assistance Equipment used: None Transfers: Bed to chair/wheelchair/BSC, Sit to/from Stand Sit to Stand: Max assist, +2 safety/equipment, +2 physical assistance     Squat pivot transfers: Max assist, +2 physical assistance, +2 safety/equipment     General transfer comment: Max A x 2 for initial stand at bedside, seated rest break before squat pivot to drop arm recliner to R side w/ Max A x 2 needed and noted difficulty advancing R foot    Ambulation/Gait                   Stairs             Wheelchair Mobility    Modified Rankin (Stroke Patients Only) Modified Rankin (Stroke Patients Only) Pre-Morbid Rankin Score: Moderate disability Modified Rankin: Severe disability     Balance Overall balance assessment: Needs assistance Sitting-balance support: Single extremity supported, Feet supported Sitting balance-Leahy Scale: Fair     Standing balance support: Bilateral upper extremity supported Standing balance-Leahy Scale: Zero                              Cognition Arousal/Alertness: Awake/alert Behavior During Therapy: WFL for tasks assessed/performed, Flat affect Overall Cognitive Status: No family/caregiver present to determine baseline cognitive functioning  General Comments: pt follows commands well with increased time, inconsistent reponses to questions at times, does respond to humor well and able to express needs fairly well        Exercises      General Comments General comments  (skin integrity, edema, etc.): VSS on RA, pt with significant wheezing although denies SOB, sats stable, signifigant bil LE edema, respiratory arriving at EOS      Pertinent Vitals/Pain Pain Assessment Pain Assessment: Faces Faces Pain Scale: No hurt Pain Intervention(s): Monitored during session    Home Living                          Prior Function            PT Goals (current goals can now be found in the care plan section) Acute Rehab PT Goals PT Goal Formulation: With patient Time For Goal Achievement: 09/24/22 Progress towards PT goals: Progressing toward goals    Frequency    Min 3X/week      PT Plan      Co-evaluation PT/OT/SLP Co-Evaluation/Treatment: Yes Reason for Co-Treatment: Complexity of the patient's impairments (multi-system involvement);For patient/therapist safety;To address functional/ADL transfers PT goals addressed during session: Strengthening/ROM;Balance;Mobility/safety with mobility        AM-PAC PT "6 Clicks" Mobility   Outcome Measure  Help needed turning from your back to your side while in a flat bed without using bedrails?: A Lot Help needed moving from lying on your back to sitting on the side of a flat bed without using bedrails?: A Lot Help needed moving to and from a bed to a chair (including a wheelchair)?: Total Help needed standing up from a chair using your arms (e.g., wheelchair or bedside chair)?: Total Help needed to walk in hospital room?: Total Help needed climbing 3-5 steps with a railing? : Total 6 Click Score: 8    End of Session Equipment Utilized During Treatment: Gait belt Activity Tolerance: Patient tolerated treatment well Patient left: in chair;with call bell/phone within reach;with chair alarm set Nurse Communication: Mobility status;Need for lift equipment (maxi-move pad placed under pt) PT Visit Diagnosis: Other abnormalities of gait and mobility (R26.89);Muscle weakness (generalized)  (M62.81);Hemiplegia and hemiparesis Hemiplegia - Right/Left: Left Hemiplegia - caused by: Cerebral infarction     Time: 0981-1914 PT Time Calculation (min) (ACUTE ONLY): 24 min  Charges:  $Therapeutic Activity: 8-22 mins                     Nadia Torr R. PTA Acute Rehabilitation Services Office: [phone]    Catalina Antigua 09/14/2022, 12:12 PM

## 2022-09-14 NOTE — Plan of Care (Signed)
  Problem: Education: Goal: Knowledge of disease or condition will improve Outcome: Not Progressing Goal: Knowledge of secondary prevention will improve (MUST DOCUMENT ALL) Outcome: Not Progressing Goal: Knowledge of patient specific risk factors will improve (Mark N/A or DELETE if not current risk factor) Outcome: Not Progressing   Problem: Ischemic Stroke/TIA Tissue Perfusion: Goal: Complications of ischemic stroke/TIA will be minimized Outcome: Not Progressing   Problem: Coping: Goal: Will verbalize positive feelings about self Outcome: Not Progressing Goal: Will identify appropriate support needs Outcome: Not Progressing   Problem: Health Behavior/Discharge Planning: Goal: Ability to manage health-related needs will improve Outcome: Not Progressing Goal: Goals will be collaboratively established with patient/family Outcome: Not Progressing   Problem: Self-Care: Goal: Ability to participate in self-care as condition permits will improve Outcome: Not Progressing Goal: Verbalization of feelings and concerns over difficulty with self-care will improve Outcome: Not Progressing Goal: Ability to communicate needs accurately will improve Outcome: Not Progressing   Problem: Nutrition: Goal: Risk of aspiration will decrease Outcome: Not Progressing Goal: Dietary intake will improve Outcome: Not Progressing   Problem: Education: Goal: Knowledge of General Education information will improve Description: Including pain rating scale, medication(s)/side effects and non-pharmacologic comfort measures Outcome: Not Progressing   Problem: Health Behavior/Discharge Planning: Goal: Ability to manage health-related needs will improve Outcome: Not Progressing   Problem: Clinical Measurements: Goal: Ability to maintain clinical measurements within normal limits will improve Outcome: Not Progressing Goal: Will remain free from infection Outcome: Not Progressing Goal: Diagnostic test  results will improve Outcome: Not Progressing Goal: Respiratory complications will improve Outcome: Not Progressing Goal: Cardiovascular complication will be avoided Outcome: Not Progressing   Problem: Activity: Goal: Risk for activity intolerance will decrease Outcome: Not Progressing   Problem: Nutrition: Goal: Adequate nutrition will be maintained Outcome: Not Progressing   Problem: Coping: Goal: Level of anxiety will decrease Outcome: Not Progressing   Problem: Elimination: Goal: Will not experience complications related to bowel motility Outcome: Not Progressing Goal: Will not experience complications related to urinary retention Outcome: Not Progressing   Problem: Pain Managment: Goal: General experience of comfort will improve Outcome: Not Progressing   Problem: Safety: Goal: Ability to remain free from injury will improve Outcome: Not Progressing   Problem: Skin Integrity: Goal: Risk for impaired skin integrity will decrease Outcome: Not Progressing   

## 2022-09-14 NOTE — TOC Progression Note (Addendum)
Transition of Care [facility]) - Progression Note    Patient Details  Name: Jacob Bass MRN: [id number] Date of Birth: 10-17-1960  Transition of Care [facility]) CM/SW Contact  Jinger Neighbors, [place] Phone Number: 09/14/2022, 12:24 PM  Clinical Narrative:     CSW met with Patricia Nettle, BSW [phone] [place]. She met with patient and will review records and consults and make a determination to petition the court for GOP.         Expected Discharge Plan and Services                                               Social Determinants of Health (SDOH) Interventions    Readmission Risk Interventions     No data to display

## 2022-09-14 NOTE — Procedures (Signed)
Cortrak  Tube Type:  Cortrak - 43 inches Tube Location:  Left nare Secured by: Bridle Technique Used to Measure Tube Placement:  Marking at nare/corner of mouth Cortrak Secured At:  69 cm   Cortrak Tube Team Note:  Consult received to place a Cortrak feeding tube.   X-ray is required, abdominal x-ray has been ordered by the Cortrak team. Please confirm tube placement before using the Cortrak tube.   If the tube becomes dislodged please keep the tube and contact the Cortrak team at [email or web address] for replacement.  If after hours and replacement cannot be delayed, place a NG tube and confirm placement with an abdominal x-ray.    Koleen Distance MS, RD, LDN Please refer to [facility] for RD and/or RD on-call/weekend/after hours pager

## 2022-09-15 ENCOUNTER — Inpatient Hospital Stay (HOSPITAL_COMMUNITY): Payer: Medicaid Other

## 2022-09-15 DIAGNOSIS — I639 Cerebral infarction, unspecified: Secondary | ICD-10-CM | POA: Diagnosis not present

## 2022-09-15 DIAGNOSIS — Z515 Encounter for palliative care: Secondary | ICD-10-CM

## 2022-09-15 LAB — COMPREHENSIVE METABOLIC PANEL
ALT: 49 U/L — ABNORMAL HIGH (ref 0–44)
AST: 79 U/L — ABNORMAL HIGH (ref 15–41)
Albumin: 1.8 g/dL — ABNORMAL LOW (ref 3.5–5.0)
Alkaline Phosphatase: 62 U/L (ref 38–126)
Anion gap: 8 (ref 5–15)
BUN: 11 mg/dL (ref 8–23)
CO2: 21 mmol/L — ABNORMAL LOW (ref 22–32)
Calcium: 7.5 mg/dL — ABNORMAL LOW (ref 8.9–10.3)
Chloride: 116 mmol/L — ABNORMAL HIGH (ref 98–111)
Creatinine, Ser: 1.31 mg/dL — ABNORMAL HIGH (ref 0.61–1.24)
GFR, Estimated: >60 mL/min (ref >60)
Glucose, Bld: 95 mg/dL (ref 70–99)
Potassium: 3.4 mmol/L — ABNORMAL LOW (ref 3.5–5.1)
Sodium: 145 mmol/L (ref 135–145)
Total Bilirubin: 1.7 mg/dL — ABNORMAL HIGH (ref 0.3–1.2)
Total Protein: 6.4 g/dL — ABNORMAL LOW (ref 6.5–8.1)

## 2022-09-15 LAB — GLUCOSE, CAPILLARY
Glucose-Capillary: 101 mg/dL — ABNORMAL HIGH (ref 70–99)
Glucose-Capillary: 64 mg/dL — ABNORMAL LOW (ref 70–99)
Glucose-Capillary: 72 mg/dL (ref 70–99)
Glucose-Capillary: 75 mg/dL (ref 70–99)
Glucose-Capillary: 80 mg/dL (ref 70–99)
Glucose-Capillary: 91 mg/dL (ref 70–99)
Glucose-Capillary: 94 mg/dL (ref 70–99)
Glucose-Capillary: 98 mg/dL (ref 70–99)

## 2022-09-15 LAB — MAGNESIUM
Magnesium: 1.6 mg/dL — ABNORMAL LOW (ref 1.7–2.4)
Magnesium: 2 mg/dL (ref 1.7–2.4)

## 2022-09-15 LAB — CBC
HCT: 25.9 % — ABNORMAL LOW (ref 39.0–52.0)
Hemoglobin: 8.5 g/dL — ABNORMAL LOW (ref 13.0–17.0)
MCH: 33.1 pg (ref 26.0–34.0)
MCHC: 32.8 g/dL (ref 30.0–36.0)
MCV: 100.8 fL — ABNORMAL HIGH (ref 80.0–100.0)
Platelets: 74 10*3/uL — ABNORMAL LOW (ref 150–400)
RBC: 2.57 MIL/uL — ABNORMAL LOW (ref 4.22–5.81)
RDW: 17.2 % — ABNORMAL HIGH (ref 11.5–15.5)
WBC: 4.8 10*3/uL (ref 4.0–10.5)
nRBC: 0 % (ref 0.0–0.2)

## 2022-09-15 LAB — PHOSPHORUS
Phosphorus: 3 mg/dL (ref 2.5–4.6)
Phosphorus: 2.8 mg/dL (ref 2.5–4.6)

## 2022-09-15 MED ORDER — FUROSEMIDE 10 MG/ML IJ SOLN
40.0000 mg | Freq: Once | INTRAMUSCULAR | Status: AC
  Administered 2022-09-15: 40 mg via INTRAVENOUS
  Filled 2022-09-15: qty 4

## 2022-09-15 MED ORDER — POTASSIUM CHLORIDE 20 MEQ PO PACK
40.0000 meq | PACK | Freq: Once | ORAL | Status: AC
  Administered 2022-09-15: 40 meq via ORAL
  Filled 2022-09-15: qty 2

## 2022-09-15 MED ORDER — LACTULOSE 10 GM/15ML PO SOLN
10.0000 g | Freq: Two times a day (BID) | ORAL | Status: AC
  Administered 2022-09-15 – 2022-09-17 (×6): 10 g
  Filled 2022-09-15 (×6): qty 30

## 2022-09-15 MED ORDER — MAGNESIUM SULFATE 2 GM/50ML IV SOLN
2.0000 g | Freq: Once | INTRAVENOUS | Status: AC
  Administered 2022-09-15: 2 g via INTRAVENOUS
  Filled 2022-09-15: qty 50

## 2022-09-15 NOTE — Progress Notes (Signed)
When going back to room to straight cathed it was noted that patient had voided. Re-bladder scanned 570. Straight cathed only returned 170.

## 2022-09-15 NOTE — Progress Notes (Addendum)
   Palliative Medicine Inpatient Follow Up Note  Palliative care has acknowledged the consultation for Jacob Bass.   According to patient contacts there are none. I have called [employer] and confirmed this. In the past they were in the process of obtaining Guardianship.   It appears that his situation is in process of review by Patricia Nettle, [place] [phone]) from [place] for Guardianship.   We will continue to follow along peripherally though at this time in the setting of patients present mental state and lack of surrogate decision maker we are unable to complete an initial consultation.   No Charge ______________________________________________________________________________________ [place] Team Team Cell Phone: [phone] Please utilize secure chat with additional questions, if there is no response within 30 minutes please call the above phone number  Palliative Medicine Team providers are available by phone from 7am to 7pm daily and can be reached through the team cell phone.  Should this patient require assistance outside of these hours, please call the patient's attending physician.

## 2022-09-15 NOTE — Plan of Care (Signed)

## 2022-09-15 NOTE — Progress Notes (Signed)
PROGRESS NOTE  Jacob Bass  DOB: May 22, 1961  PCP: Donia Ast., MD DUK:[id number]  DOA: 09/10/2022  LOS: 4 days  Hospital Day: 6  Brief narrative: Jacob Bass is a 62 y.o. male with PMH significant for history of stroke with residual left-sided deficits, HTN, liver cirrhosis, chronic anemia, COPD, recent diagnosis of COVID who lives at [facility]. 1/8, patient was sent to the ED for altered mental status and weakness.  Last known normal the previous night.  In the ED, patient was afebrile, hemodynamically stable Labs with creatinine elevated to 2.09, ammonia level elevated to 99 CT head unremarkable MRI brain positive for small acute infarct in the left coronary data CTA of head and neck showed focal stenosis at the right V1 and evidence suspicious for pseudoaneurysm Also showed moderate focal stenosis of the right P1 left P2.  Admitted to [facility] Neurology consulted for acute stroke  Subjective: Patient was seen and examined this morning.  Middle-aged African-American male.  Looks older for his age. Alert, awake, slow to follow motor commands.  Unable to give verbal response Has core track feeding on And significant abdominal distention as well as bilateral 2+ pedal edema. Chart reviewed In the last 24 hours, no fever, heart rate in 80s, blood pressure in 140s, breathing on room air Last set of labs from this morning with WC count 4.8, hemoglobin 8.5, platelets 74, potassium low at 3.4, BUN/creatinine 11/1.31, magnesium 1.6, mildly elevated AST, ALT and total bili  Assessment and plan: Acute CVA prior CVA with left-sided deficits Brought to the ED after patient was noted to be less interactive compared to her baseline.   MRI showed left acute coronary due to small infarct  Stroke workup completed.  Neurology consult appreciated Echo showed EF of 55 to 60% with no cardiac source of embolism A1c 4.5, LDL 74 PTA not on any antithrombotic or anticoagulant. Patient has been  started on aspirin 81 mg daily.  No DAPT given thrombocytopenia. Statin not started because of underlying liver cirrhosis  Acute medical encephalopathy Hepatic encephalopathy Ammonia level was elevated to 99.  Her altered mentation was probably partly due to hyperammonemia Start lactulose through feeding tube. Repeat ammonia level was down to 57 on 1/11.  Repeat tomorrow. Mental status still seems less than normal baseline.  Unable to have meaningful verbal conversation.  Liver cirrhosis with anasarca Hypoalbuminemia Essential hypertension Per history, patient has worsening edema since October PTA on metoprolol 25 mg twice daily, Lasix 40 mg daily, Amlodipine 5 mg daily, lisinopril 5 mg daily Currently all medicines are on hold He has significant anasarca.  I ordered for 1 dose of IV Lasix 40 mg this morning. Also ordered for paracentesis but unable to do today because he is not able to consent and no family is available.  We can hold off 1 to 2 days Continue Protonix Recent Labs  Lab 09/10/22 0958 09/10/22 1000 09/10/22 1009 09/11/22 1708 09/13/22 0411 09/15/22 0539  AST  --  113*  --   --  89* 79*  ALT  --  64*  --   --  53* 49*  ALKPHOS  --  67  --   --  62 62  BILITOT  --  1.8*  --   --  1.6* 1.7*  PROT  --  6.7  --   --  5.9* 6.4*  ALBUMIN  --  1.7*  --   --  1.7* 1.8*  AMMONIA 99*  --   --   --  57*  --   INR  --   --  1.5*  --   --   --   LIPASE  --  58*  --   --   --   --   PLT  --  85*  --  83*  --  74*   Acute kidney injury Baseline creatinine around 1.  Presented with creatinine of 2.08 Patient was started on IV fluid and IV albumin Creatinine gradually improving.  No more IV fluid as patient seems to be overall hypervolemic. Recent Labs    09/10/22 1000 09/11/22 1708 09/13/22 0411 09/15/22 0539  BUN 23 22 15 11   CREATININE 2.09* 1.77* 1.47* 1.31*   Hypokalemia/hypomagnesemia Low potassium and magnesium low.  Replacement ordered. Recent Labs  Lab  09/10/22 1000 09/11/22 1708 09/13/22 0411 09/14/22 1700 09/15/22 0539  K 4.0 3.8 3.3*  --  3.4*  MG  --   --   --  1.7 1.6*  PHOS  --   --   --  3.0 3.0   COPD Continue bronchodilators  Dysphagia Core track feeding to continue Speech therapy following   Hypoglycemia Blood glucose value was low when patient was altered and unable to take oral intake.  Currently on core track feeding.  BPH Flomax   Mobility: Encourage ambulation.  Will obtain PT evaluation after mental status improvement  Goals of care   Code Status: Full Code    Scheduled Meds:  aspirin  300 mg Rectal Daily   feeding supplement (PROSource TF20)  60 mL Per Tube Daily   free water  150 mL Per Tube Q4H   heparin injection (subcutaneous)  5,000 Units Subcutaneous Q8H   lactulose  10 g Per Tube BID   mometasone-formoterol  2 puff Inhalation BID   pantoprazole (PROTONIX) IV  40 mg Intravenous Q24H   potassium chloride  40 mEq Oral Once   tamsulosin  0.4 mg Oral Daily   thiamine  100 mg Per Tube Daily   umeclidinium bromide  1 puff Inhalation Daily    PRN meds: acetaminophen **OR** acetaminophen (TYLENOL) oral liquid 160 mg/5 mL **OR** acetaminophen, ipratropium-albuterol, senna-docusate   Infusions:   feeding supplement (JEVITY 1.5 CAL/FIBER) 1,000 mL (09/15/22 0730)   magnesium sulfate bolus IVPB      Skin assessment:     Nutritional status:  Body mass index is 27.93 kg/m.  Nutrition Problem: Moderate Malnutrition Etiology: chronic illness Signs/Symptoms: moderate fat depletion, severe muscle depletion     Diet:  Diet Order             Diet NPO time specified  Diet effective now                   DVT prophylaxis:  heparin injection 5,000 Units Start: 09/11/22 2330 SCD's Start: 09/10/22 1605   Antimicrobials: None currently Fluid: None currently Consultants: Palliative care Family Communication: None at bedside  Status is: Inpatient  Continue in-hospital care because:  Needs IV diuresis.  Mental status remains poor.  On core track feeding Level of care: Telemetry Medical   Dispo: The patient is from: [facility] long[facility]              Anticipated d/c is to: Hopefully back to nursing home              Patient currently is not medically stable to d/c.   Difficult to place patient No    Antimicrobials: Anti-infectives (From admission, onward)  Start     Dose/Rate Route Frequency Ordered Stop   09/10/22 1345  cefTRIAXone (ROCEPHIN) 1 g in sodium chloride 0.9 % 100 mL IVPB        1 g 200 mL/hr over 30 Minutes Intravenous  Once 09/10/22 1331 09/10/22 1605   09/10/22 1345  azithromycin (ZITHROMAX) 500 mg in sodium chloride 0.9 % 250 mL IVPB        500 mg 250 mL/hr over 60 Minutes Intravenous  Once 09/10/22 1331 09/10/22 1605       Objective: Vitals:   09/15/22 0715 09/15/22 0827  BP: (!) 145/86   Pulse: 82   Resp: 20   Temp: (!) 97.4 F (36.3 C)   SpO2: 100% 96%    Intake/Output Summary (Last 24 hours) at 09/15/2022 1148 Last data filed at 09/15/2022 0900 Gross per 24 hour  Intake 825 ml  Output 520 ml  Net 305 ml   Filed Weights   09/10/22 0948 09/15/22 0500  Weight: 74.8 kg 80.9 kg   Weight change:  Body mass index is 27.93 kg/m.   Physical Exam: General exam: Middle-aged African-American male.  Looks older for his age. Skin: No rashes, lesions or ulcers. HEENT: Atraumatic, normocephalic, no obvious bleeding.  Core track feeding ongoing Lungs: Diminished air entry in both bases.  Gurgling sound from his throat CVS: Regular rate and rhythm, no murmur GI/Abd soft, abdominal distention present, nontender, bowel sound present CNS: Eyes open.  Tries to follow motor commands but slow.  Unable to follow verbal command.  Baseline left sided deficits Psychiatry: Sad affect Extremities: 2 to 3+ bilateral pedal edema, no calf tenderness  Data Review: I have personally reviewed the laboratory data and studies available.  F/u labs  ordered Unresulted Labs (From admission, onward)     Start     Ordered   09/16/22 0500  CBC with Differential/Platelet  Tomorrow morning,   R        09/15/22 1144   09/16/22 0500  Comprehensive metabolic panel  Tomorrow morning,   R        09/15/22 1144   09/16/22 0500  Ammonia  Tomorrow morning,   R        09/15/22 1144   09/14/22 1700  Magnesium  (ICU Tube Feeding: PEPuP )  5A & 5P,   R      09/14/22 1330   09/14/22 1700  Phosphorus  (ICU Tube Feeding: PEPuP )  5A & 5P,   R      09/14/22 1330            Total time spent in review of labs and imaging, patient evaluation, formulation of plan, documentation and communication with family: 65 minutes  Signed, Lorin Glass, MD [facility] 09/15/2022

## 2022-09-16 ENCOUNTER — Inpatient Hospital Stay (HOSPITAL_COMMUNITY): Payer: Medicaid Other

## 2022-09-16 DIAGNOSIS — I639 Cerebral infarction, unspecified: Secondary | ICD-10-CM | POA: Diagnosis not present

## 2022-09-16 LAB — COMPREHENSIVE METABOLIC PANEL
ALT: 46 U/L — ABNORMAL HIGH (ref 0–44)
AST: 74 U/L — ABNORMAL HIGH (ref 15–41)
Albumin: 1.9 g/dL — ABNORMAL LOW (ref 3.5–5.0)
Alkaline Phosphatase: 85 U/L (ref 38–126)
Anion gap: 6 (ref 5–15)
BUN: 14 mg/dL (ref 8–23)
CO2: 23 mmol/L (ref 22–32)
Calcium: 7.6 mg/dL — ABNORMAL LOW (ref 8.9–10.3)
Chloride: 118 mmol/L — ABNORMAL HIGH (ref 98–111)
Creatinine, Ser: 1.39 mg/dL — ABNORMAL HIGH (ref 0.61–1.24)
GFR, Estimated: 57 mL/min — ABNORMAL LOW (ref >60)
Glucose, Bld: 96 mg/dL (ref 70–99)
Potassium: 3.5 mmol/L (ref 3.5–5.1)
Sodium: 147 mmol/L — ABNORMAL HIGH (ref 135–145)
Total Bilirubin: 1.2 mg/dL (ref 0.3–1.2)
Total Protein: 6.4 g/dL — ABNORMAL LOW (ref 6.5–8.1)

## 2022-09-16 LAB — GLUCOSE, CAPILLARY
Glucose-Capillary: 95 mg/dL (ref 70–99)
Glucose-Capillary: 101 mg/dL — ABNORMAL HIGH (ref 70–99)
Glucose-Capillary: 90 mg/dL (ref 70–99)
Glucose-Capillary: 98 mg/dL (ref 70–99)
Glucose-Capillary: 85 mg/dL (ref 70–99)
Glucose-Capillary: 83 mg/dL (ref 70–99)

## 2022-09-16 LAB — CBC WITH DIFFERENTIAL/PLATELET
Abs Immature Granulocytes: 0.02 10*3/uL (ref 0.00–0.07)
Basophils Absolute: 0 10*3/uL (ref 0.0–0.1)
Basophils Relative: 1 %
Eosinophils Absolute: 0.1 10*3/uL (ref 0.0–0.5)
Eosinophils Relative: 3 %
HCT: 26.5 % — ABNORMAL LOW (ref 39.0–52.0)
Hemoglobin: 8.6 g/dL — ABNORMAL LOW (ref 13.0–17.0)
Immature Granulocytes: 0 %
Lymphocytes Relative: 26 %
Lymphs Abs: 1.5 10*3/uL (ref 0.7–4.0)
MCH: 33.1 pg (ref 26.0–34.0)
MCHC: 32.5 g/dL (ref 30.0–36.0)
MCV: 101.9 fL — ABNORMAL HIGH (ref 80.0–100.0)
Monocytes Absolute: 0.8 10*3/uL (ref 0.1–1.0)
Monocytes Relative: 14 %
Neutro Abs: 3.2 10*3/uL (ref 1.7–7.7)
Neutrophils Relative %: 56 %
Platelets: 76 10*3/uL — ABNORMAL LOW (ref 150–400)
RBC: 2.6 MIL/uL — ABNORMAL LOW (ref 4.22–5.81)
RDW: 17.2 % — ABNORMAL HIGH (ref 11.5–15.5)
WBC: 5.6 10*3/uL (ref 4.0–10.5)
nRBC: 0 % (ref 0.0–0.2)

## 2022-09-16 LAB — PHOSPHORUS: Phosphorus: 2.6 mg/dL (ref 2.5–4.6)

## 2022-09-16 LAB — MAGNESIUM: Magnesium: 2 mg/dL (ref 1.7–2.4)

## 2022-09-16 LAB — AMMONIA: Ammonia: 46 umol/L — ABNORMAL HIGH (ref 9–35)

## 2022-09-16 MED ORDER — ASPIRIN 325 MG PO TABS
325.0000 mg | ORAL_TABLET | Freq: Every day | ORAL | Status: DC
  Administered 2022-09-16 – 2022-09-25 (×10): 325 mg
  Filled 2022-09-16 (×10): qty 1

## 2022-09-16 MED ORDER — FREE WATER
200.0000 mL | Status: DC
  Administered 2022-09-16 – 2022-10-06 (×102): 200 mL

## 2022-09-16 MED ORDER — LIDOCAINE HCL (PF) 1 % IJ SOLN
INTRAMUSCULAR | Status: AC
  Filled 2022-09-16: qty 30

## 2022-09-16 MED ORDER — LIDOCAINE HCL (PF) 1 % IJ SOLN: 15.0000 mL | Freq: Once | INTRAMUSCULAR | Status: DC

## 2022-09-16 NOTE — Progress Notes (Signed)
IR requested to perform paracentesis. Pt with encephalopathy and cannot consent for self. Resident of [employer] and has no family, next of kin, [place]. Apparently formal guardianship is not obtained.  Primary team feels paracentesis is medically necessary due to worsening abd distention and renal function.  Will proceed with paracentesis today.  Ascencion Dike PA-C Interventional Radiology 09/16/2022 11:41 AM

## 2022-09-16 NOTE — Progress Notes (Addendum)
PROGRESS NOTE  Jacob Bass  DOB: 19-Jun-1961  PCP: Donia Ast., MD WUJ:[id number]  DOA: 09/10/2022  LOS: 5 days  Hospital Day: 7  Brief narrative: Jacob Bass is a 62 y.o. male with PMH significant for history of stroke with residual left-sided deficits, HTN, liver cirrhosis, chronic anemia, COPD, recent diagnosis of COVID who lives at [facility]. 1/8, patient was sent to the ED for altered mental status and weakness.  Last known normal the previous night.  In the ED, patient was afebrile, hemodynamically stable Labs with creatinine elevated to 2.09, ammonia level elevated to 99 CT head unremarkable MRI brain positive for small acute infarct in the left coronary data CTA of head and neck showed focal stenosis at the right V1 and evidence suspicious for pseudoaneurysm Also showed moderate focal stenosis of the right P1 left P2.  Admitted to [facility] Neurology consulted for acute stroke  Subjective: Patient was seen and examined this morning. Propped up in bed.  Grunting.  But not requiring supplemental oxygen.  Not tachypneic  Abdomen hard and distended.   No family at bedside. Ongoing tube feeding.  Assessment and plan: Acute CVA prior CVA with left-sided deficits Brought to the ED after patient was noted to be less interactive compared to her baseline.   MRI showed left acute coronary due to small infarct  Stroke workup completed.  Neurology consult appreciated Echo showed EF of 55 to 60% with no cardiac source of embolism A1c 4.5, LDL 74 PTA not on any antithrombotic or anticoagulant. Patient has been started on aspirin 81 mg daily.  No DAPT given thrombocytopenia. Statin not started because of underlying liver cirrhosis  Acute medical encephalopathy Hepatic encephalopathy Ammonia level was elevated to 99.  His altered mentation was probably partly due to hyperammonemia Currently getting lactic through tube feeding.  Ammonia level improving, trend as below. Mental  status still seems less than normal baseline.  Unable to have meaningful verbal conversation.  Liver cirrhosis with anasarca Hypoalbuminemia Essential hypertension Per history, patient has worsening edema since October PTA on metoprolol 25 mg twice daily, Lasix 40 mg daily, Amlodipine 5 mg daily, lisinopril 5 mg daily Currently all medicines are on hold He has significant anasarca.  1 dose of IV Lasix was given yesterday.  Creatinine slightly worse. On exam, he has distended abdomen which is affecting his breathing, probably affecting renal function as well.  Patient is not able to give verbal consent at this time.  He has no immediate family members available either.  Given his worsening medical condition, I would consider paracentesis as medically necessary and proceed. IR consulted for paracentesis. Continue Protonix Recent Labs  Lab 09/10/22 0958 09/10/22 1000 09/10/22 1009 09/11/22 1708 09/13/22 0411 09/15/22 0539 09/16/22 0619  AST  --  113*  --   --  89* 79* 74*  ALT  --  64*  --   --  53* 49* 46*  ALKPHOS  --  67  --   --  62 62 85  BILITOT  --  1.8*  --   --  1.6* 1.7* 1.2  PROT  --  6.7  --   --  5.9* 6.4* 6.4*  ALBUMIN  --  1.7*  --   --  1.7* 1.8* 1.9*  AMMONIA 99*  --   --   --  57*  --  46*  INR  --   --  1.5*  --   --   --   --   LIPASE  --  58*  --   --   --   --   --   PLT  --  85*  --  83*  --  74* 76*   Acute kidney injury Baseline creatinine around 1.  Presented with creatinine of 2.08 Patient was started on IV fluid and IV albumin Creatinine gradually improved but volume status worsened.  IV fluid was hence stopped. Recent Labs    09/10/22 1000 09/11/22 1708 09/13/22 0411 09/15/22 0539 09/16/22 0619  BUN 23 22 15 11 14   CREATININE 2.09* 1.77* 1.47* 1.31* 1.39*   Hypokalemia/hypomagnesemia Potassium and magnesium level improved with replacement. Recent Labs  Lab 09/10/22 1000 09/11/22 1708 09/13/22 0411 09/14/22 1700 09/15/22 0539  09/15/22 1757 09/16/22 0619  K 4.0 3.8 3.3*  --  3.4*  --  3.5  MG  --   --   --  1.7 1.6* 2.0 2.0  PHOS  --   --   --  3.0 3.0 2.8 2.6   Chronic macrocytic anemia Secondary to liver disease.  Hemoglobin stable between 8 and 9. Recent Labs    09/10/22 1000 09/10/22 1115 09/10/22 1611 09/11/22 1708 09/15/22 0539 09/16/22 0619  HGB 8.8*  --   --  8.5* 8.5* 8.6*  MCV 100.8*  --   --  100.0 100.8* 101.9*  VITAMINB12  --   --  2,050*  --   --   --   FOLATE  --  >40.0  --   --   --   --     COPD Continue bronchodilators  Dysphagia Core track feeding to continue Speech therapy following   Hypoglycemia Blood glucose value was low when patient was altered and unable to take oral intake.  Currently on core track feeding.  BPH Flomax   Mobility: Encourage ambulation.  Will obtain PT evaluation after mental status improvement  Goals of care   Code Status: Full Code    Scheduled Meds:  aspirin  300 mg Rectal Daily   feeding supplement (PROSource TF20)  60 mL Per Tube Daily   free water  200 mL Per Tube Q4H   heparin injection (subcutaneous)  5,000 Units Subcutaneous Q8H   lactulose  10 g Per Tube BID   mometasone-formoterol  2 puff Inhalation BID   pantoprazole (PROTONIX) IV  40 mg Intravenous Q24H   tamsulosin  0.4 mg Oral Daily   thiamine  100 mg Per Tube Daily   umeclidinium bromide  1 puff Inhalation Daily    PRN meds: acetaminophen **OR** acetaminophen (TYLENOL) oral liquid 160 mg/5 mL **OR** acetaminophen, ipratropium-albuterol, senna-docusate   Infusions:   feeding supplement (JEVITY 1.5 CAL/FIBER) 1,000 mL (09/15/22 2339)    Skin assessment:     Nutritional status:  Body mass index is 27.93 kg/m.  Nutrition Problem: Moderate Malnutrition Etiology: chronic illness Signs/Symptoms: moderate fat depletion, severe muscle depletion     Diet:  Diet Order             Diet NPO time specified  Diet effective now                   DVT  prophylaxis:  heparin injection 5,000 Units Start: 09/11/22 2330 SCD's Start: 09/10/22 1605   Antimicrobials: None currently Fluid: None currently Consultants: Palliative care Family Communication: None at bedside  Status is: Inpatient  Continue in-hospital care because: Needs paracentesis.  Mental status remains poor.  On core track feeding.   Level of care: Telemetry Medical   Dispo: The  patient is from: [facility] long-term care              Anticipated d/c is to: Hopefully back to nursing home              Patient currently is not medically stable to d/c.   Difficult to place patient No    Antimicrobials: Anti-infectives (From admission, onward)    Start     Dose/Rate Route Frequency Ordered Stop   09/10/22 1345  cefTRIAXone (ROCEPHIN) 1 g in sodium chloride 0.9 % 100 mL IVPB        1 g 200 mL/hr over 30 Minutes Intravenous  Once 09/10/22 1331 09/10/22 1605   09/10/22 1345  azithromycin (ZITHROMAX) 500 mg in sodium chloride 0.9 % 250 mL IVPB        500 mg 250 mL/hr over 60 Minutes Intravenous  Once 09/10/22 1331 09/10/22 1605       Objective: Vitals:   09/16/22 0755 09/16/22 0835  BP:  (!) 147/83  Pulse:  80  Resp:  18  Temp:  98.6 F (37 C)  SpO2: 100% 100%    Intake/Output Summary (Last 24 hours) at 09/16/2022 1019 Last data filed at 09/16/2022 0653 Gross per 24 hour  Intake 960 ml  Output 325 ml  Net 635 ml   Filed Weights   09/10/22 0948 09/15/22 0500  Weight: 74.8 kg 80.9 kg   Weight change:  Body mass index is 27.93 kg/m.   Physical Exam: General exam: Middle-aged African-American male.  Looks older for his age. Skin: No rashes, lesions or ulcers. HEENT: Atraumatic, normocephalic, no obvious bleeding.  Core track feeding ongoing Lungs: Diminished air entry in both bases.  Moaning but not requiring supplemental oxygen CVS: Regular rate and rhythm, no murmur GI/Abd firm, abdominal distention present, nontender, bowel sound present CNS: Eyes  open.  Tries to follow motor commands but slow.  Unable to follow verbal command.  Baseline left sided deficits Psychiatry: Sad affect Extremities: 2 to 3+ bilateral pedal edema, no calf tenderness  Data Review: I have personally reviewed the laboratory data and studies available.  F/u labs ordered Unresulted Labs (From admission, onward)     Start     Ordered   09/17/22 0500  CBC with Differential/Platelet  Daily,   R      09/16/22 1019   09/17/22 0500  Basic metabolic panel  Daily,   R      09/16/22 1019            Total time spent in review of labs and imaging, patient evaluation, formulation of plan, documentation and communication with family: 45 minutes  Signed, Lorin Glass, MD [facility] 09/16/2022

## 2022-09-16 NOTE — Plan of Care (Signed)
HEAD OF BED 30 DEGREES AT ALL TIMES

## 2022-09-16 NOTE — Plan of Care (Signed)

## 2022-09-16 NOTE — Plan of Care (Signed)
  Problem: Education: Goal: Knowledge of disease or condition will improve Outcome: Not Progressing   Problem: Coping: Goal: Will verbalize positive feelings about self Outcome: Not Progressing Goal: Will identify appropriate support needs Outcome: Not Progressing   Problem: Self-Care: Goal: Ability to participate in self-care as condition permits will improve Outcome: Not Progressing Goal: Verbalization of feelings and concerns over difficulty with self-care will improve Outcome: Not Progressing Goal: Ability to communicate needs accurately will improve Outcome: Not Progressing   Problem: Nutrition: Goal: Risk of aspiration will decrease Outcome: Not Progressing

## 2022-09-17 DIAGNOSIS — I639 Cerebral infarction, unspecified: Secondary | ICD-10-CM | POA: Diagnosis not present

## 2022-09-17 LAB — CBC WITH DIFFERENTIAL/PLATELET
Abs Immature Granulocytes: 0.04 10*3/uL (ref 0.00–0.07)
Basophils Absolute: 0 10*3/uL (ref 0.0–0.1)
Basophils Relative: 0 %
Eosinophils Absolute: 0.1 10*3/uL (ref 0.0–0.5)
Eosinophils Relative: 2 %
HCT: 25.9 % — ABNORMAL LOW (ref 39.0–52.0)
Hemoglobin: 8.5 g/dL — ABNORMAL LOW (ref 13.0–17.0)
Immature Granulocytes: 1 %
Lymphocytes Relative: 21 %
Lymphs Abs: 1.4 10*3/uL (ref 0.7–4.0)
MCH: 33.3 pg (ref 26.0–34.0)
MCHC: 32.8 g/dL (ref 30.0–36.0)
MCV: 101.6 fL — ABNORMAL HIGH (ref 80.0–100.0)
Monocytes Absolute: 1.1 10*3/uL — ABNORMAL HIGH (ref 0.1–1.0)
Monocytes Relative: 16 %
Neutro Abs: 4 10*3/uL (ref 1.7–7.7)
Neutrophils Relative %: 60 %
Platelets: 73 10*3/uL — ABNORMAL LOW (ref 150–400)
RBC: 2.55 MIL/uL — ABNORMAL LOW (ref 4.22–5.81)
RDW: 17.5 % — ABNORMAL HIGH (ref 11.5–15.5)
WBC: 6.6 10*3/uL (ref 4.0–10.5)
nRBC: 0 % (ref 0.0–0.2)

## 2022-09-17 LAB — BASIC METABOLIC PANEL
Anion gap: 4 — ABNORMAL LOW (ref 5–15)
BUN: 19 mg/dL (ref 8–23)
CO2: 24 mmol/L (ref 22–32)
Calcium: 7.5 mg/dL — ABNORMAL LOW (ref 8.9–10.3)
Chloride: 117 mmol/L — ABNORMAL HIGH (ref 98–111)
Creatinine, Ser: 1.31 mg/dL — ABNORMAL HIGH (ref 0.61–1.24)
GFR, Estimated: >60 mL/min (ref >60)
Glucose, Bld: 100 mg/dL — ABNORMAL HIGH (ref 70–99)
Potassium: 3.8 mmol/L (ref 3.5–5.1)
Sodium: 145 mmol/L (ref 135–145)

## 2022-09-17 LAB — GLUCOSE, CAPILLARY
Glucose-Capillary: 93 mg/dL (ref 70–99)
Glucose-Capillary: 113 mg/dL — ABNORMAL HIGH (ref 70–99)
Glucose-Capillary: 85 mg/dL (ref 70–99)
Glucose-Capillary: 97 mg/dL (ref 70–99)

## 2022-09-17 NOTE — Progress Notes (Signed)
PROGRESS NOTE  Jabori Zundel  DOB: 1960/10/01  PCP: Donia Ast., MD UJW:[id number]  DOA: 09/10/2022  LOS: 6 days  Hospital Day: 8  Brief narrative: Jacob Bass is a 62 y.o. male with PMH significant for history of stroke with residual left-sided deficits, HTN, liver cirrhosis, chronic anemia, COPD, recent diagnosis of COVID who lives at [facility]. 1/8, patient was sent to the ED for altered mental status and weakness.  Last known normal the previous night.  In the ED, patient was afebrile, hemodynamically stable Labs with creatinine elevated to 2.09, ammonia level elevated to 99 CT head unremarkable MRI brain positive for small acute infarct in the left coronary data CTA of head and neck showed focal stenosis at the right V1 and evidence suspicious for pseudoaneurysm Also showed moderate focal stenosis of the right P1 left P2.  Admitted to [facility] Neurology consulted for acute stroke  Subjective: Patient was seen and examined this morning. Lying on bed.  Grunting.  Not on supplemental oxygen.  On tube feeding. Abdominal distention is still there but less firm and less distended than yesterday.  Assessment and plan: Acute CVA prior CVA with left-sided deficits Brought to the ED after patient was noted to be less interactive compared to her baseline.   MRI showed left acute coronary due to small infarct  Stroke workup completed.  Neurology consult appreciated Echo showed EF of 55 to 60% with no cardiac source of embolism A1c 4.5, LDL 74 PTA not on any antithrombotic or anticoagulant. Patient has been started on aspirin 81 mg daily.  No DAPT given thrombocytopenia. Statin not started because of underlying liver cirrhosis  Acute medical encephalopathy Hepatic encephalopathy Ammonia level was elevated to 99.  His altered mentation was probably partly due to hyperammonemia Currently getting lactic through tube feeding.  Ammonia level improving, trend as below. Mental status  still is compromised.  Unable to have meaningful verbal conversation. Reported normal at baseline.    Liver cirrhosis with anasarca Hypoalbuminemia Essential hypertension Per history, patient has worsening edema since October PTA on metoprolol 25 mg twice daily, Lasix 40 mg daily, Amlodipine 5 mg daily, lisinopril 5 mg daily Currently all medicines are on hold He has significant anasarca 1/14, underwent paracentesis with removal of 4 L of ascitic fluid.  Patient still has abdominal distention.  May need some more removed.  Call placed to IR. Continue Protonix Recent Labs  Lab 09/11/22 1708 09/13/22 0411 09/15/22 0539 09/16/22 0619 09/17/22 0515  AST  --  89* 79* 74*  --   ALT  --  53* 49* 46*  --   ALKPHOS  --  62 62 85  --   BILITOT  --  1.6* 1.7* 1.2  --   PROT  --  5.9* 6.4* 6.4*  --   ALBUMIN  --  1.7* 1.8* 1.9*  --   AMMONIA  --  57*  --  46*  --   PLT 83*  --  74* 76* 73*    Acute kidney injury Baseline creatinine around 1.  Presented with creatinine of 2.08 Patient was started on IV fluid and IV albumin.  IV fluid has been stopped   Creatinine gradually improving. Recent Labs    09/10/22 1000 09/11/22 1708 09/13/22 0411 09/15/22 0539 09/16/22 0619 09/17/22 0515  BUN 23 22 15 11 14 19   CREATININE 2.09* 1.77* 1.47* 1.31* 1.39* 1.31*    Hypokalemia/hypomagnesemia Potassium and magnesium level improved with replacement. Recent Labs  Lab 09/11/22 1708 09/13/22  0411 09/14/22 1700 09/15/22 0539 09/15/22 1757 09/16/22 0619 09/17/22 0515  K 3.8 3.3*  --  3.4*  --  3.5 3.8  MG  --   --  1.7 1.6* 2.0 2.0  --   PHOS  --   --  3.0 3.0 2.8 2.6  --     Chronic macrocytic anemia Secondary to liver disease.  Hemoglobin stable between 8 and 9. Recent Labs    09/10/22 1000 09/10/22 1115 09/10/22 1611 09/11/22 1708 09/15/22 0539 09/16/22 0619 09/17/22 0515  HGB 8.8*  --   --  8.5* 8.5* 8.6* 8.5*  MCV 100.8*  --   --  100.0 100.8* 101.9* 101.6*  VITAMINB12   --   --  2,050*  --   --   --   --   FOLATE  --  >40.0  --   --   --   --   --      COPD Continue bronchodilators  Dysphagia Core track feeding to continue Speech therapy following   Hypoglycemia Blood glucose value was low when patient was altered and unable to take oral intake.  Currently on core track feeding.  BPH Flomax   Mobility: Encourage ambulation.  Will obtain PT evaluation after mental status improvement  Goals of care   Code Status: Full Code    Scheduled Meds:  aspirin  325 mg Per Tube Daily   feeding supplement (PROSource TF20)  60 mL Per Tube Daily   free water  200 mL Per Tube Q4H   heparin injection (subcutaneous)  5,000 Units Subcutaneous Q8H   lactulose  10 g Per Tube BID   lidocaine (PF)  15 mL Intradermal Once   mometasone-formoterol  2 puff Inhalation BID   pantoprazole (PROTONIX) IV  40 mg Intravenous Q24H   tamsulosin  0.4 mg Oral Daily   thiamine  100 mg Per Tube Daily   umeclidinium bromide  1 puff Inhalation Daily    PRN meds: acetaminophen **OR** acetaminophen (TYLENOL) oral liquid 160 mg/5 mL **OR** acetaminophen, ipratropium-albuterol, senna-docusate   Infusions:   feeding supplement (JEVITY 1.5 CAL/FIBER) 1,000 mL (09/16/22 1937)    Skin assessment:     Nutritional status:  Body mass index is 27.73 kg/m.  Nutrition Problem: Moderate Malnutrition Etiology: chronic illness Signs/Symptoms: moderate fat depletion, severe muscle depletion     Diet:  Diet Order             Diet NPO time specified  Diet effective now                   DVT prophylaxis:  heparin injection 5,000 Units Start: 09/11/22 2330 SCD's Start: 09/10/22 1605   Antimicrobials: None currently Fluid: None currently Consultants: Palliative care Family Communication: None at bedside  Status is: Inpatient  Continue in-hospital care because: Needs more paracentesis.  Mental status remains poor.  On core track feeding.   Level of care: Telemetry  Medical   Dispo: The patient is from: [facility] long-term care              Anticipated d/c is to: Hopefully back to nursing home              Patient currently is not medically stable to d/c.   Difficult to place patient No    Antimicrobials: Anti-infectives (From admission, onward)    Start     Dose/Rate Route Frequency Ordered Stop   09/10/22 1345  cefTRIAXone (ROCEPHIN) 1 g in sodium chloride 0.9 %  100 mL IVPB        1 g 200 mL/hr over 30 Minutes Intravenous  Once 09/10/22 1331 09/10/22 1605   09/10/22 1345  azithromycin (ZITHROMAX) 500 mg in sodium chloride 0.9 % 250 mL IVPB        500 mg 250 mL/hr over 60 Minutes Intravenous  Once 09/10/22 1331 09/10/22 1605       Objective: Vitals:   09/17/22 0950 09/17/22 1253  BP:  (!) 143/96  Pulse:  87  Resp:    Temp:  98.3 F (36.8 C)  SpO2: 99%     Intake/Output Summary (Last 24 hours) at 09/17/2022 1317 Last data filed at 09/17/2022 0600 Gross per 24 hour  Intake --  Output 400 ml  Net -400 ml    Filed Weights   09/10/22 0948 09/15/22 0500 09/17/22 0500  Weight: 74.8 kg 80.9 kg 80.3 kg   Weight change:  Body mass index is 27.73 kg/m.   Physical Exam: General exam: Middle-aged African-American male.  Looks older for his age. Skin: No rashes, lesions or ulcers. HEENT: Atraumatic, normocephalic, no obvious bleeding.  Core track feeding ongoing Lungs: Diminished air entry in both bases.  Moaning but not requiring supplemental oxygen CVS: Regular rate and rhythm, no murmur GI/Abd continues to have abdominal tenderness, less firm than yesterday.   CNS: Eyes open.  Tries to follow motor commands but slow.  Unable to follow verbal command.  Baseline left sided deficits Psychiatry: Sad affect Extremities: 2 to 3+ bilateral pedal edema, no calf tenderness  Data Review: I have personally reviewed the laboratory data and studies available.  F/u labs ordered Unresulted Labs (From admission, onward)     Start      Ordered   09/17/22 0500  CBC with Differential/Platelet  Daily,   R      09/16/22 1019   09/17/22 0500  Basic metabolic panel  Daily,   R      09/16/22 1019            Total time spent in review of labs and imaging, patient evaluation, formulation of plan, documentation and communication with family: 45 minutes  Signed, Lorin Glass, MD [facility] 09/17/2022

## 2022-09-17 NOTE — Progress Notes (Signed)
Patient with poor urine output, Dr. Pietro Cassis made aware and told RN to bladder scan patient and if scan shows patient retaining to do I/O . Patient bladder scan (458mL showed) and I/O catheter with 25 cc of urine output. MD aware, no further orders at this time.

## 2022-09-17 NOTE — Progress Notes (Signed)
Physical Therapy Treatment Patient Details Name: Jacob Bass MRN: [id number] DOB: 22-Feb-1961 Today's Date: 09/17/2022   History of Present Illness 62 y.o. male presents to [facility] hospital on 09/10/2022 with AMS and L weakness from [facility]. 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 PMH includes CVA, COPD, cirrhosis, gastric ulcer.    PT Comments    Pt greeted supine for skilled PT session with some regression this date as pt with increased abdominal discomfort, pt non-verbal throughout session so unclear if in pain. Pt with increased difficulty maintaining sitting balance EOB this date needing BUE support and min physical assist as pt with posterior lean this date. Pt with noted distention of abdomen (paracentesis performed yesterday) and grimacing with an forward flexion at trunk inhibiting upright sitting at EOB. Pt able to complete squat pivot to chair with max assist +2. Current plan remains appropriate to address deficits and maximize functional independence and decrease caregiver burden. Pt continues to benefit from skilled PT services to progress toward functional mobility goals.     Recommendations for follow up therapy are one component of a multi-disciplinary discharge planning process, led by the attending physician.  Recommendations may be updated based on patient status, additional functional criteria and insurance authorization.  Follow Up Recommendations  Skilled nursing-short term rehab (<3 hours/day) Can patient physically be transported by private vehicle: No   Assistance Recommended at Discharge Frequent or constant Supervision/Assistance  Patient can return home with the following Two people to help with walking and/or transfers;Two people to help with bathing/dressing/bathroom;Assistance with cooking/housework;Assistance with feeding;Direct supervision/assist for medications management;Direct supervision/assist for financial management;Assist for transportation;Help with  stairs or ramp for entrance   Equipment Recommendations   (defer to post-acute)    Recommendations for Other Services       Precautions / Restrictions Precautions Precautions: Fall Restrictions Weight Bearing Restrictions: No     Mobility  Bed Mobility Overal bed mobility: Needs Assistance Bed Mobility: Supine to Sit     Supine to sit: Mod assist, HOB elevated     General bed mobility comments: able to lift trunk fairly well once assisted LEs to EOB    Transfers Overall transfer level: Needs assistance Equipment used: None Transfers: Bed to chair/wheelchair/BSC       Squat pivot transfers: Max assist, +2 physical assistance, +2 safety/equipment     General transfer comment: squat pivot to drop arm recliner to R side w/ Max A x 2 needed and noted difficulty advancing R foot and, deferred standing trials as pt with increased difficulty maintaining static sitting at EOB    Ambulation/Gait                   Stairs             Wheelchair Mobility    Modified Rankin (Stroke Patients Only) Modified Rankin (Stroke Patients Only) Pre-Morbid Rankin Score: Moderate disability Modified Rankin: Severe disability     Balance Overall balance assessment: Needs assistance Sitting-balance support: Single extremity supported, Feet supported Sitting balance-Leahy Scale: Fair Sitting balance - Comments: pt with more difficulty maintaining sitting balance this date. noted posterior lean needing asssit to correct and min assist needed to maintain sitting generally Postural control: Posterior lean Standing balance support: Bilateral upper extremity supported Standing balance-Leahy Scale: Zero                              Cognition Arousal/Alertness: Awake/alert Behavior During Therapy: [facility] for tasks  assessed/performed, Flat affect Overall Cognitive Status: No family/caregiver present to determine baseline cognitive functioning                                  General Comments: pt follows commands well with increased time, non-verbal during session this date        Exercises General Exercises - Lower Extremity Long Arc Quad: AROM, Right, PROM, Left, 10 reps, Seated Hip Flexion/Marching: AROM, Right, PROM, Left, 10 reps, Seated    General Comments General comments (skin integrity, edema, etc.): VSS on RA, pt with significant wheezing although denies SOB, sats stable, abdomen distended and frim      Pertinent Vitals/Pain Pain Assessment Pain Assessment: Faces Faces Pain Scale: Hurts a little bit Pain Location: abdomen Pain Descriptors / Indicators: Guarding, Grimacing Pain Intervention(s): Monitored during session, Limited activity within patient's tolerance, Repositioned    Home Living                          Prior Function            PT Goals (current goals can now be found in the care plan section) Acute Rehab PT Goals PT Goal Formulation: With patient Time For Goal Achievement: 09/24/22 Progress towards PT goals: Not progressing toward goals - comment (increased fatigue)    Frequency    Min 3X/week      PT Plan      Co-evaluation              AM-PAC PT "6 Clicks" Mobility   Outcome Measure  Help needed turning from your back to your side while in a flat bed without using bedrails?: A Lot Help needed moving from lying on your back to sitting on the side of a flat bed without using bedrails?: A Lot Help needed moving to and from a bed to a chair (including a wheelchair)?: Total Help needed standing up from a chair using your arms (e.g., wheelchair or bedside chair)?: Total Help needed to walk in hospital room?: Total Help needed climbing 3-5 steps with a railing? : Total 6 Click Score: 8    End of Session Equipment Utilized During Treatment: Gait belt Activity Tolerance: Patient limited by fatigue Patient left: in chair;with call bell/phone within reach;with chair  alarm set Nurse Communication: Mobility status PT Visit Diagnosis: Other abnormalities of gait and mobility (R26.89);Muscle weakness (generalized) (M62.81);Hemiplegia and hemiparesis Hemiplegia - Right/Left: Left Hemiplegia - caused by: Cerebral infarction     Time: 1339-1406 PT Time Calculation (min) (ACUTE ONLY): 27 min  Charges:  $Therapeutic Activity: 23-37 mins                     Andrian Urbach R. PTA Acute Rehabilitation Services Office: [phone]    Catalina Antigua 09/17/2022, 2:17 PM

## 2022-09-17 NOTE — Progress Notes (Signed)
Patient has bloody drainage oozing from right abdomen following paracentesis. 4x4 applied to site.

## 2022-09-17 NOTE — Progress Notes (Signed)
Pt arrived to room, s/p paracentesis with moderate bleeding from right abdomen. Rn called radiology team and informed them. Physician assistant to come and assess patient.

## 2022-09-17 NOTE — Plan of Care (Signed)

## 2022-09-18 DIAGNOSIS — I639 Cerebral infarction, unspecified: Secondary | ICD-10-CM | POA: Diagnosis not present

## 2022-09-18 DIAGNOSIS — R188 Other ascites: Secondary | ICD-10-CM

## 2022-09-18 DIAGNOSIS — R1310 Dysphagia, unspecified: Secondary | ICD-10-CM

## 2022-09-18 DIAGNOSIS — R4182 Altered mental status, unspecified: Secondary | ICD-10-CM | POA: Diagnosis not present

## 2022-09-18 DIAGNOSIS — K7460 Unspecified cirrhosis of liver: Secondary | ICD-10-CM | POA: Diagnosis not present

## 2022-09-18 LAB — CBC WITH DIFFERENTIAL/PLATELET
Abs Immature Granulocytes: 0.07 10*3/uL (ref 0.00–0.07)
Basophils Absolute: 0.1 10*3/uL (ref 0.0–0.1)
Basophils Relative: 1 %
Eosinophils Absolute: 0.2 10*3/uL (ref 0.0–0.5)
Eosinophils Relative: 3 %
HCT: 25.6 % — ABNORMAL LOW (ref 39.0–52.0)
Hemoglobin: 8.3 g/dL — ABNORMAL LOW (ref 13.0–17.0)
Immature Granulocytes: 1 %
Lymphocytes Relative: 23 %
Lymphs Abs: 1.6 10*3/uL (ref 0.7–4.0)
MCH: 33.1 pg (ref 26.0–34.0)
MCHC: 32.4 g/dL (ref 30.0–36.0)
MCV: 102 fL — ABNORMAL HIGH (ref 80.0–100.0)
Monocytes Absolute: 1.1 10*3/uL — ABNORMAL HIGH (ref 0.1–1.0)
Monocytes Relative: 16 %
Neutro Abs: 3.9 10*3/uL (ref 1.7–7.7)
Neutrophils Relative %: 56 %
Platelets: 69 10*3/uL — ABNORMAL LOW (ref 150–400)
RBC: 2.51 MIL/uL — ABNORMAL LOW (ref 4.22–5.81)
RDW: 17.7 % — ABNORMAL HIGH (ref 11.5–15.5)
WBC: 7 10*3/uL (ref 4.0–10.5)
nRBC: 0 % (ref 0.0–0.2)

## 2022-09-18 LAB — BASIC METABOLIC PANEL
Anion gap: 4 — ABNORMAL LOW (ref 5–15)
BUN: 28 mg/dL — ABNORMAL HIGH (ref 8–23)
CO2: 24 mmol/L (ref 22–32)
Calcium: 7.5 mg/dL — ABNORMAL LOW (ref 8.9–10.3)
Chloride: 114 mmol/L — ABNORMAL HIGH (ref 98–111)
Creatinine, Ser: 1.49 mg/dL — ABNORMAL HIGH (ref 0.61–1.24)
GFR, Estimated: 52 mL/min — ABNORMAL LOW (ref >60)
Glucose, Bld: 105 mg/dL — ABNORMAL HIGH (ref 70–99)
Potassium: 3.9 mmol/L (ref 3.5–5.1)
Sodium: 142 mmol/L (ref 135–145)

## 2022-09-18 LAB — GLUCOSE, CAPILLARY
Glucose-Capillary: 105 mg/dL — ABNORMAL HIGH (ref 70–99)
Glucose-Capillary: 106 mg/dL — ABNORMAL HIGH (ref 70–99)
Glucose-Capillary: 109 mg/dL — ABNORMAL HIGH (ref 70–99)
Glucose-Capillary: 124 mg/dL — ABNORMAL HIGH (ref 70–99)
Glucose-Capillary: 97 mg/dL (ref 70–99)
Glucose-Capillary: 99 mg/dL (ref 70–99)

## 2022-09-18 NOTE — Consult Note (Signed)
Consultation Note Date: 09/18/2022   Patient Name: Shyloh Louro  DOB: 06-Jun-1961  MRN: [id number]  Age / Sex: 62 y.o., male  PCP: Donia Ast., MD Referring Physician: Lorin Glass, MD  Reason for Consultation: Establishing goals of care  HPI/Patient Profile: 62 y.o. male   admitted on 09/10/2022 with   PMH significant for history of stroke with residual left-sided deficits, HTN, liver cirrhosis, chronic anemia, COPD, recent diagnosis of COVID who lives at [facility].  1/8, patient was sent to the ED for altered mental status and weakness.  Last known normal the previous night.   In the ED, patient was afebrile, hemodynamically stable Labs with creatinine elevated to 2.09, ammonia level elevated to 99 CT head unremarkable MRI brain positive for small acute infarct in the left coronary data CTA of head and neck showed focal stenosis at the right V1 and evidence suspicious for pseudoaneurysm Also showed moderate focal stenosis of the right P1 left P2.   Admitted for treatment and stabilization     Clinical Assessment and Goals of Care:  This nurse practitioner reviewed medical records, received report from team assessed the patient and then discussed patient in detail with treatment team.  Patient does not currently have medical decision-making capacity.  According to medical records he is a resident of [address], they were in process of obtaining guardianship.  [facility] for guardianship [name], [phone]) has spoken with representative from [facility] health transition of care team and assessed patient while here in the hospital.  [place]/transition of care team actively working with [facility] DSS in  attempts to secure guardianship at this time.   No documented H POA, advanced care planning documents at this time.   Patient with complex medical issues; he  is high risk for decompensation.  Significant treatment option decision, advanced care planning decisions, and anticipatory care needs pending.  After discussion with transition of care team, PMT will sign off at this time.  Please reconsult once guardianship is been secured,  if we can assist with goals of care discussion and advance care planning.   Discharge Planning: To Be Determined      Primary Diagnoses: Present on Admission:  Acute CVA (cerebrovascular accident) (HCC)  Stroke (cerebrum) (HCC)   I have reviewed the medical record, interviewed the patient and family, and examined the patient. The following aspects are pertinent.  Past Medical History:  Diagnosis Date   Cirrhosis (HCC)    COPD (chronic obstructive pulmonary disease) (HCC)    Gastric ulcer    Stroke (HCC)    Social History   Socioeconomic History   Marital status: Single    Spouse name: Not on file   Number of children: Not on file   Years of education: Not on file   Highest education level: Not on file  Occupational History   Not on file  Tobacco Use   Smoking status: Not on file   Smokeless tobacco: Not on file  Substance and Sexual Activity   Alcohol  use: Not on file   Drug use: Not on file   Sexual activity: Not on file  Other Topics Concern   Not on file  Social History Narrative   Not on file   Social Determinants of Health   Financial Resource Strain: Not on file  Food Insecurity: Not on file  Transportation Needs: Not on file  Physical Activity: Not on file  Stress: Not on file  Social Connections: Not on file   History reviewed. No pertinent family history. Scheduled Meds:  aspirin  325 mg Per Tube Daily   feeding supplement (PROSource TF20)  60 mL Per Tube Daily   free water  200 mL Per Tube Q4H   heparin injection (subcutaneous)  5,000 Units Subcutaneous Q8H   lidocaine (PF)  15 mL Intradermal Once   mometasone-formoterol  2 puff Inhalation BID   pantoprazole (PROTONIX) IV   40 mg Intravenous Q24H   tamsulosin  0.4 mg Oral Daily   thiamine  100 mg Per Tube Daily   umeclidinium bromide  1 puff Inhalation Daily   Continuous Infusions:  feeding supplement (JEVITY 1.5 CAL/FIBER) 1,000 mL (09/18/22 0539)   PRN Meds:.acetaminophen **OR** acetaminophen (TYLENOL) oral liquid 160 mg/5 mL **OR** acetaminophen, ipratropium-albuterol, senna-docusate Medications Prior to Admission:  Prior to Admission medications   Medication Sig Start Date End Date Taking? Authorizing Provider  acetaminophen (TYLENOL) 500 MG tablet Take 1,000 mg by mouth every 8 (eight) hours as needed (moderate/severe pain).   Yes [provider]  albuterol (VENTOLIN HFA) 108 (90 Base) MCG/ACT inhaler Inhale 2 puffs into the lungs every 4 (four) hours as needed for wheezing or shortness of breath.   Yes [provider]  amLODipine (NORVASC) 5 MG tablet Take 5 mg by mouth in the morning. (0900)   Yes [provider]  atorvastatin (LIPITOR) 40 MG tablet Take 40 mg by mouth in the morning. (0900)   Yes [provider]  Calcium Carb-Cholecalciferol (CALCIUM + VITAMIN D3) 600-5 MG-MCG TABS Take 1 tablet by mouth daily at 12 noon. (1200)   Yes [provider]  ciprofloxacin (CIPRO) 500 MG tablet Take 500 mg by mouth in the morning. (0900) Prophylactic.   Yes [provider]  cyanocobalamin (VITAMIN B12) 1000 MCG tablet Take 1,000 mcg by mouth in the morning. (0900)   Yes [provider]  fluticasone-salmeterol (ADVAIR) 250-50 MCG/ACT AEPB Inhale 1 puff into the lungs in the morning and at bedtime. (0800, 2000)   Yes [provider]  folic acid (FOLVITE) 1 MG tablet Take 1 mg by mouth in the morning. (0900)   Yes [provider]  furosemide (LASIX) 20 MG tablet Take 40 mg by mouth in the morning. (0900)   Yes [provider]  ipratropium-albuterol (DUONEB) 0.5-2.5 (3) MG/3ML SOLN Take 3 mLs by nebulization every 4 (four) hours  as needed (antiasthmatic).   Yes [provider]  lactulose (CHRONULAC) 10 GM/15ML solution Take 30 g by mouth 3 (three) times daily. (0900, 1400, 2100)   Yes [provider]  latanoprost (XALATAN) 0.005 % ophthalmic solution Place 1 drop into both eyes at bedtime. (2100)   Yes [provider]  lisinopril (ZESTRIL) 5 MG tablet Take 5 mg by mouth in the morning.   Yes [provider]  loperamide (IMODIUM A-D) 2 MG tablet Take 4 mg by mouth 4 (four) times daily as needed for diarrhea or loose stools.   Yes [provider]  magnesium oxide (MAG-OX) 400 (240 Mg)  MG tablet Take 400 mg by mouth 3 (three) times daily. (0900, 1400, 2100)   Yes [provider]  melatonin 5 MG TABS Take 5 mg by mouth at bedtime. (2100)   Yes [provider]  metoprolol tartrate (LOPRESSOR) 25 MG tablet Take 25 mg by mouth 2 (two) times daily. (0800, 2000)   Yes [provider]  Nutritional Supplements (NUTRITIONAL DRINK) LIQD Take 120 mLs by mouth 3 (three) times daily. House supplement (0900, 1400, 2100)   Yes [provider]  pantoprazole (PROTONIX) 40 MG tablet Take 40 mg by mouth 2 (two) times daily. (0800, 2000)   Yes [provider]  tamsulosin (FLOMAX) 0.4 MG CAPS capsule Take 0.4 mg by mouth in the morning. (0900)   Yes [provider]  tiotropium (SPIRIVA) 18 MCG inhalation capsule Place 18 mcg into inhaler and inhale in the morning. (0900)   Yes [provider]   Allergies  Allergen Reactions   Penicillins Other (See Comments)    Unknown reaction Listed on MAR   Review of Systems  Unable to perform ROS: Mental status change    Physical Exam Cardiovascular:     Rate and Rhythm: Normal rate.  Neurological:     Mental Status: He is alert.     Comments: Patient is unable to follow commands, speech is garbled     Vital Signs: BP (!) 146/78 (BP Location: Left Arm)   Pulse 85   Temp 98.6 F (37 C)  (Oral)   Resp (!) 24   Ht 5\' 7"  (1.702 m)   Wt 80.5 kg   SpO2 100%   BMI 27.80 kg/m  Pain Scale: 0-10 POSS *See Group Information*: 1-Acceptable,Awake and alert Pain Score: 0-No pain   SpO2: SpO2: 100 % O2 Device:SpO2: 100 % O2 Flow Rate: .   IO: Intake/output summary:  Intake/Output Summary (Last 24 hours) at 09/18/2022 1425 Last data filed at 09/18/2022 0500 Gross per 24 hour  Intake --  Output 225 ml  Net -225 ml    LBM: Last BM Date : 09/17/22 Baseline Weight: Weight: 74.8 kg Most recent weight: Weight: 80.5 kg     Palliative Assessment/Data: 30 % at best   Discussed with Isaiah Serge with TOC team   Time In: 1330 Time Out: 1430 Time Total: 60 minutes Greater than 50%  of this time was spent counseling and coordinating care related to the above assessment and plan.  Signed by: Lorinda Creed, NP   Please contact Palliative Medicine Team phone at [phone] for questions and concerns.  For individual provider: See Loretha Stapler

## 2022-09-18 NOTE — Progress Notes (Addendum)
Speech Language Pathology Treatment: Dysphagia;Cognitive-Linquistic  Patient Details Name: Jacob Bass MRN: [id number] DOB: [birth year] Today's Date: 09/18/2022 Time: 2119-4174 SLP Time Calculation (min) (ACUTE ONLY): 16 min  Assessment / Plan / Recommendation Clinical Impression  Pt seen for dysphagia, dysarthria and cognition. Lips dry, peeling and cleaned with toothette and [place] removing large dried surface. He took 2 teaspoon sips water and 2 bites applesauce with delayed bolus formation and transit and delayed swallow initiation. Multiple swallows indicative of suspected pharyngeal residue. Pt has been, NPO, has Cortrak and previous MBS has been 6 days. SLP plans to repeat MBS tomorrow to reevaluate oropharyngeal function and ability to start po versus needing more long term means of nutrition.     HPI HPI: Jacob Bass is a 62 y.o. male who presented to ED from [facility] via EMS with left sided weakness, speech changes, wheezing. PMH/PSH includes prior CVA, subarachnoid hemorrhage, cirrhosis, hep C, hx of etOH abuse, dementia, COPD.  Pt known to SLP from prior admissions, with baseline dysarthria and oral dysphagia. He was most recently seen by SLP on 04/13/22 with recommendations for Dysphagia 1 solids and thin liquids.      SLP Plan  MBS;Continue with current plan of care      Recommendations for follow up therapy are one component of a multi-disciplinary discharge planning process, led by the attending physician.  Recommendations may be updated based on patient status, additional functional criteria and insurance authorization.    Recommendations  Diet recommendations: NPO Medication Administration: Via alternative means                Oral Care Recommendations: Oral care QID Follow Up Recommendations: Skilled nursing-short term rehab (<3 hours/day) Assistance recommended at discharge: Frequent or constant Supervision/Assistance SLP Visit Diagnosis: Dysphagia,  oropharyngeal phase (R13.12);Dysarthria and anarthria (R47.1);Cognitive communication deficit (Y81.448) Plan: MBS;Continue with current plan of care           Houston Siren  09/18/2022, 11:05 AM

## 2022-09-18 NOTE — Progress Notes (Signed)
IR requested to perform paracentesis. Pt with encephalopathy and cannot consent for self. Resident of [facility] and has no family, next of kin, [place]. Apparently formal guardianship is not obtained.  Primary team feels paracentesis is medically necessary due to worsening abd distention and renal function.  Will proceed with paracentesis today.  Soyla Dryer, [place] [phone] 09/18/2022, 9:28 AM

## 2022-09-18 NOTE — Progress Notes (Signed)
PROGRESS NOTE  Jacob Bass  DOB: [birth year]  PCP: Donia Ast., MD JXB:[id number]  DOA: 09/10/2022  LOS: 7 days  Hospital Day: 9  Brief narrative: Jacob Bass is a 62 y.o. male with PMH significant for history of stroke with residual left-sided deficits, HTN, liver cirrhosis, chronic anemia, COPD, recent diagnosis of COVID who lives at [facility]. 1/8, patient was sent to the ED for altered mental status and weakness.  Last known normal the previous night.  In the ED, patient was afebrile, hemodynamically stable Labs with creatinine elevated to 2.09, ammonia level elevated to 99 CT head unremarkable MRI brain positive for small acute infarct in the left coronary data CTA of head and neck showed focal stenosis at the right V1 and evidence suspicious for pseudoaneurysm Also showed moderate focal stenosis of the right P1 left P2.  Admitted to [facility] Neurology consulted for acute stroke  Subjective: Patient was seen and examined this afternoon. Alert, awake, mumbles.  Unable to follow commands today. Ongoing NG tube feeding Abdominal distention has worsened.  Pending paracentesis by IR again today  Assessment and plan: Acute CVA prior CVA with left-sided deficits Brought to the ED after patient was noted to be less interactive compared to her baseline.   MRI showed left acute coronary due to small infarct  Stroke workup completed.  Neurology consult appreciated Echo showed EF of 55 to 60% with no cardiac source of embolism A1c 4.5, LDL 74 PTA not on any antithrombotic or anticoagulant. Patient has been started on aspirin 81 mg daily.  No DAPT given thrombocytopenia. Statin not started because of underlying liver cirrhosis  Acute medical encephalopathy Hepatic encephalopathy Ammonia level was elevated to 99.  His altered mentation was probably partly due to hyperammonemia Currently getting lactic through tube feeding.  Ammonia level improving, trend as below. Mental status  still is compromised.  Unable to have meaningful verbal conversation. Reported normal at baseline.    Liver cirrhosis with anasarca Hypoalbuminemia Essential hypertension Per history, patient has worsening edema since October PTA on metoprolol 25 mg twice daily, Lasix 40 mg daily, Amlodipine 5 mg daily, lisinopril 5 mg daily Currently all medicines are on hold He has significant anasarca 1/14, underwent paracentesis with removal of 4 L of ascitic fluid.  Abdominal distention and firmness has worsened. IR to try paracentesis again today.   Continue Protonix Recent Labs  Lab 09/11/22 1708 09/13/22 0411 09/15/22 0539 09/16/22 0619 09/17/22 0515 09/18/22 0516  AST  --  89* 79* 74*  --   --   ALT  --  53* 49* 46*  --   --   ALKPHOS  --  62 62 85  --   --   BILITOT  --  1.6* 1.7* 1.2  --   --   PROT  --  5.9* 6.4* 6.4*  --   --   ALBUMIN  --  1.7* 1.8* 1.9*  --   --   AMMONIA  --  57*  --  46*  --   --   PLT 83*  --  74* 76* 73* 69*    Acute kidney injury Baseline creatinine around 1.  Presented with creatinine of 2.08 Creatinine remains elevated.  Overall hypervolemic.  Liver cirrhosis.  However, may have arterial hypovolemia due to low albumin.  Continue to monitor renal function Recent Labs    09/10/22 1000 09/11/22 1708 09/13/22 0411 09/15/22 0539 09/16/22 0619 09/17/22 0515 09/18/22 0516  BUN 23 22 15 11 14  19  28*  CREATININE 2.09* 1.77* 1.47* 1.31* 1.39* 1.31* 1.49*    Hypokalemia/hypomagnesemia Potassium and magnesium level improved with replacement. Recent Labs  Lab 09/13/22 0411 09/14/22 1700 09/15/22 0539 09/15/22 1757 09/16/22 0619 09/17/22 0515 09/18/22 0516  K 3.3*  --  3.4*  --  3.5 3.8 3.9  MG  --  1.7 1.6* 2.0 2.0  --   --   PHOS  --  3.0 3.0 2.8 2.6  --   --     Chronic macrocytic anemia Secondary to liver disease.  Hemoglobin stable between 8 and 9. Recent Labs    09/10/22 1115 09/10/22 1611 09/11/22 1708 09/15/22 0539 09/16/22 0619  09/17/22 0515 09/18/22 0516  HGB  --   --  8.5* 8.5* 8.6* 8.5* 8.3*  MCV  --   --  100.0 100.8* 101.9* 101.6* 102.0*  VITAMINB12  --  2,050*  --   --   --   --   --   FOLATE >40.0  --   --   --   --   --   --     COPD Continue bronchodilators  Dysphagia Core track feeding to continue Speech therapy following   Hypoglycemia Blood glucose value was low when patient was altered and unable to take oral intake.  Currently on core track feeding.  BPH Flomax   Mobility: Encourage ambulation.  Will obtain PT evaluation after mental status improvement  Goals of care   Code Status: Full Code    Scheduled Meds:  aspirin  325 mg Per Tube Daily   feeding supplement (PROSource TF20)  60 mL Per Tube Daily   free water  200 mL Per Tube Q4H   heparin injection (subcutaneous)  5,000 Units Subcutaneous Q8H   lidocaine (PF)  15 mL Intradermal Once   mometasone-formoterol  2 puff Inhalation BID   pantoprazole (PROTONIX) IV  40 mg Intravenous Q24H   tamsulosin  0.4 mg Oral Daily   thiamine  100 mg Per Tube Daily   umeclidinium bromide  1 puff Inhalation Daily    PRN meds: acetaminophen **OR** acetaminophen (TYLENOL) oral liquid 160 mg/5 mL **OR** acetaminophen, ipratropium-albuterol, senna-docusate   Infusions:   feeding supplement (JEVITY 1.5 CAL/FIBER) 1,000 mL (09/18/22 0539)    Skin assessment:     Nutritional status:  Body mass index is 27.8 kg/m.  Nutrition Problem: Moderate Malnutrition Etiology: chronic illness Signs/Symptoms: moderate fat depletion, severe muscle depletion     Diet:  Diet Order             Diet NPO time specified  Diet effective now                   DVT prophylaxis:  heparin injection 5,000 Units Start: 09/11/22 2330 SCD's Start: 09/10/22 1605   Antimicrobials: None currently Fluid: None currently Consultants: Palliative care Family Communication: None at bedside  Status is: Inpatient  Continue in-hospital care because: Needs  more paracentesis.  Mental status remains poor.  On core track feeding.  Repeat paracentesis today Level of care: Telemetry Medical   Dispo: The patient is from: [facility] long-term care              Anticipated d/c is to: Hopefully back to nursing home              Patient currently is not medically stable to d/c.   Difficult to place patient No    Antimicrobials: Anti-infectives (From admission, onward)    Start  Dose/Rate Route Frequency Ordered Stop   09/10/22 1345  cefTRIAXone (ROCEPHIN) 1 g in sodium chloride 0.9 % 100 mL IVPB        1 g 200 mL/hr over 30 Minutes Intravenous  Once 09/10/22 1331 09/10/22 1605   09/10/22 1345  azithromycin (ZITHROMAX) 500 mg in sodium chloride 0.9 % 250 mL IVPB        500 mg 250 mL/hr over 60 Minutes Intravenous  Once 09/10/22 1331 09/10/22 1605       Objective: Vitals:   09/18/22 0906 09/18/22 1115  BP:  (!) 146/78  Pulse: 84 85  Resp: 20 (!) 24  Temp:  98.6 F (37 C)  SpO2:  100%    Intake/Output Summary (Last 24 hours) at 09/18/2022 1347 Last data filed at 09/18/2022 0500 Gross per 24 hour  Intake --  Output 225 ml  Net -225 ml    Filed Weights   09/15/22 0500 09/17/22 0500 09/18/22 0500  Weight: 80.9 kg 80.3 kg 80.5 kg   Weight change: 0.2 kg Body mass index is 27.8 kg/m.   Physical Exam: General exam: Middle-aged African-American male.  Looks older for his age. Skin: No rashes, lesions or ulcers. HEENT: Atraumatic, normocephalic, no obvious bleeding.  Core track feeding ongoing Lungs: Diminished air entry in both bases.  Moaning but not requiring supplemental oxygen CVS: Regular rate and rhythm, no murmur GI/Abd worsening abdominal distention and firmness.  Nontender on touch. CNS: Eyes open.  Tries to follow motor commands but slow.  Unable to follow verbal command.  Baseline left sided deficits Psychiatry: Sad affect Extremities: 2 to 3+ bilateral pedal edema, no calf tenderness  Data Review: I have  personally reviewed the laboratory data and studies available.  F/u labs ordered Unresulted Labs (From admission, onward)     Start     Ordered   09/19/22 0500  Ammonia  Tomorrow morning,   R        09/18/22 0810   09/17/22 0500  CBC with Differential/Platelet  Daily,   R      09/16/22 1019   09/17/22 0500  Basic metabolic panel  Daily,   R      09/16/22 1019            Total time spent in review of labs and imaging, patient evaluation, formulation of plan, documentation and communication with family: 45 minutes  Signed, Lorin Glass, MD [facility] 09/18/2022

## 2022-09-18 NOTE — Progress Notes (Signed)
Occupational Therapy Treatment Patient Details Name: Jacob Bass MRN: [id number] DOB: 1960-12-10 Today's Date: 09/18/2022   History of present illness 62 y.o. male presents to [facility] hospital on 09/10/2022 with AMS and L weakness from [facility]. 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 Complicated by fluid overload. Paracentesis performed 1/14 and 1/16. PMH includes CVA, COPD, cirrhosis, gastric ulcer.   OT comments  Pt with noted decline in functional abilities today requiring increased assist for bed mobility and sitting balance with significant posterior lean. Unable to progress EOB ADLs today d/t sitting balance and fatigue. Pt also w/ profuse bowel incontinence during session requiring assist for clean up. Deferred OOB attempts d/t pending paracentesis this AM.    Recommendations for follow up therapy are one component of a multi-disciplinary discharge planning process, led by the attending physician.  Recommendations may be updated based on patient status, additional functional criteria and insurance authorization.    Follow Up Recommendations  Skilled nursing-short term rehab (<3 hours/day)     Assistance Recommended at Discharge Frequent or constant Supervision/Assistance  Patient can return home with the following  Two people to help with walking and/or transfers;Two people to help with bathing/dressing/bathroom   Equipment Recommendations  None recommended by OT    Recommendations for Other Services      Precautions / Restrictions Precautions Precautions: Fall Precaution Comments: cortrak Restrictions Weight Bearing Restrictions: No       Mobility Bed Mobility Overal bed mobility: Needs Assistance Bed Mobility: Supine to Sit, Sit to Supine, Rolling Rolling: Max assist, Total assist   Supine to sit: Mod assist, +2 for physical assistance, HOB elevated Sit to supine: Max assist, +2 for physical assistance   General bed mobility comments: Mod A x 2 to sit EOB w/  assist to manuver LEs to EOB and pt attempting to push up with R elbow and pull w/ handheld assist. Max A x 2 to return to supine d/t discomfort/fatigue. Max A to roll to L side for peri care and Total A to R side w/ pt difficulty turning head to R.    Transfers                   General transfer comment: deferred due to pending paracentesis     Balance Overall balance assessment: Needs assistance Sitting-balance support: Single extremity supported, Feet supported Sitting balance-Leahy Scale: Poor Sitting balance - Comments: heavily reliant on R UE to footboard to maintain balance. unable to sustain unsupported sitting < 3 sec Postural control: Posterior lean                                 ADL either performed or assessed with clinical judgement   ADL Overall ADL's : Needs assistance/impaired                             Toileting- Clothing Manipulation and Hygiene: Total assistance;Bed level Toileting - Clothing Manipulation Details (indicate cue type and reason): bowel incontinence noted at end of session, profuse amount       General ADL Comments: Focus on EOB sitting balance w/ regression noted, unable to sit unsupported and uncomfortable d/t abdominal swelling    Extremity/Trunk Assessment Upper Extremity Assessment Upper Extremity Assessment: LUE deficits/detail LUE Deficits / Details: profound weakness w/ hx of CVA. improved tone in hands though some tone with elbow ROM. elevated on pillow LUE Coordination:  decreased fine motor;decreased gross motor   Lower Extremity Assessment Lower Extremity Assessment: Defer to PT evaluation        Vision   Vision Assessment?: Vision impaired- to be further tested in functional context Additional Comments: question L inattention at times   Perception     Praxis      Cognition Arousal/Alertness: Awake/alert Behavior During Therapy: Flat affect Overall Cognitive Status: No family/caregiver  present to determine baseline cognitive functioning                                 General Comments: pt follows commands well with increased time, mostly nonverbal today        Exercises      Shoulder Instructions       General Comments Noted wheezing noises througout    Pertinent Vitals/ Pain       Pain Assessment Pain Assessment: Faces Faces Pain Scale: Hurts little more Pain Location: abdomen Pain Descriptors / Indicators: Guarding, Grimacing Pain Intervention(s): Monitored during session, Limited activity within patient's tolerance  Home Living                                          Prior Functioning/Environment              Frequency  Min 2X/week        Progress Toward Goals  OT Goals(current goals can now be found in the care plan section)  Progress towards OT goals: Not progressing toward goals - comment (regression today d/t abdominal swelling)  Acute Rehab OT Goals Patient Stated Goal: none stated today OT Goal Formulation: Patient unable to participate in goal setting Time For Goal Achievement: 09/25/22 Potential to Achieve Goals: Fair ADL Goals Pt Will Perform Grooming: with set-up;bed level Pt Will Perform Upper Body Bathing: with min assist;bed level;sitting Pt/caregiver will Perform Home Exercise Program: Increased ROM;Left upper extremity;Increased strength;With Supervision Additional ADL Goal #1: Pt to demo bed mobility with Min A in prep for functional tasks EOB  Plan Discharge plan remains appropriate    Co-evaluation                 AM-PAC OT "6 Clicks" Daily Activity     Outcome Measure   Help from another person eating meals?: Total Help from another person taking care of personal grooming?: A Little Help from another person toileting, which includes using toliet, bedpan, or urinal?: Total Help from another person bathing (including washing, rinsing, drying)?: A Lot Help from another  person to put on and taking off regular upper body clothing?: A Lot Help from another person to put on and taking off regular lower body clothing?: Total 6 Click Score: 10    End of Session    OT Visit Diagnosis: Unsteadiness on feet (R26.81);Other abnormalities of gait and mobility (R26.89);Muscle weakness (generalized) (M62.81)   Activity Tolerance Patient limited by fatigue   Patient Left in bed;with call bell/phone within reach;with bed alarm set   Nurse Communication          Time: 8119-1478 OT Time Calculation (min): 25 min  Charges: OT General Charges $OT Visit: 1 Visit OT Treatments $Self Care/Home Management : 23-37 mins  Bradd Canary, OTR/L Acute Rehab Services Office: [phone]   Lorre Munroe 09/18/2022, 11:30 AM

## 2022-09-19 ENCOUNTER — Inpatient Hospital Stay (HOSPITAL_COMMUNITY): Payer: Medicaid Other

## 2022-09-19 DIAGNOSIS — I639 Cerebral infarction, unspecified: Secondary | ICD-10-CM | POA: Diagnosis not present

## 2022-09-19 HISTORY — PX: IR PARACENTESIS: IMG2679

## 2022-09-19 LAB — GLUCOSE, CAPILLARY
Glucose-Capillary: 105 mg/dL — ABNORMAL HIGH (ref 70–99)
Glucose-Capillary: 117 mg/dL — ABNORMAL HIGH (ref 70–99)
Glucose-Capillary: 119 mg/dL — ABNORMAL HIGH (ref 70–99)
Glucose-Capillary: 137 mg/dL — ABNORMAL HIGH (ref 70–99)
Glucose-Capillary: 147 mg/dL — ABNORMAL HIGH (ref 70–99)
Glucose-Capillary: 78 mg/dL (ref 70–99)

## 2022-09-19 LAB — BASIC METABOLIC PANEL
Anion gap: 6 (ref 5–15)
BUN: 39 mg/dL — ABNORMAL HIGH (ref 8–23)
CO2: 23 mmol/L (ref 22–32)
Calcium: 7.6 mg/dL — ABNORMAL LOW (ref 8.9–10.3)
Chloride: 112 mmol/L — ABNORMAL HIGH (ref 98–111)
Creatinine, Ser: 1.91 mg/dL — ABNORMAL HIGH (ref 0.61–1.24)
GFR, Estimated: 39 mL/min — ABNORMAL LOW (ref >60)
Glucose, Bld: 130 mg/dL — ABNORMAL HIGH (ref 70–99)
Potassium: 4.5 mmol/L (ref 3.5–5.1)
Sodium: 141 mmol/L (ref 135–145)

## 2022-09-19 LAB — CBC WITH DIFFERENTIAL/PLATELET
Abs Immature Granulocytes: 0.07 10*3/uL (ref 0.00–0.07)
Basophils Absolute: 0 10*3/uL (ref 0.0–0.1)
Basophils Relative: 0 %
Eosinophils Absolute: 0 10*3/uL (ref 0.0–0.5)
Eosinophils Relative: 1 %
HCT: 23.4 % — ABNORMAL LOW (ref 39.0–52.0)
Hemoglobin: 7.6 g/dL — ABNORMAL LOW (ref 13.0–17.0)
Immature Granulocytes: 1 %
Lymphocytes Relative: 7 %
Lymphs Abs: 0.5 10*3/uL — ABNORMAL LOW (ref 0.7–4.0)
MCH: 33.2 pg (ref 26.0–34.0)
MCHC: 32.5 g/dL (ref 30.0–36.0)
MCV: 102.2 fL — ABNORMAL HIGH (ref 80.0–100.0)
Monocytes Absolute: 0.8 10*3/uL (ref 0.1–1.0)
Monocytes Relative: 12 %
Neutro Abs: 5.2 10*3/uL (ref 1.7–7.7)
Neutrophils Relative %: 79 %
Platelets: 85 10*3/uL — ABNORMAL LOW (ref 150–400)
RBC: 2.29 MIL/uL — ABNORMAL LOW (ref 4.22–5.81)
RDW: 18.2 % — ABNORMAL HIGH (ref 11.5–15.5)
WBC: 6.6 10*3/uL (ref 4.0–10.5)
nRBC: 0 % (ref 0.0–0.2)

## 2022-09-19 LAB — AMMONIA: Ammonia: 29 umol/L (ref 9–35)

## 2022-09-19 MED ORDER — DIPHENHYDRAMINE HCL 50 MG/ML IJ SOLN
50.0000 mg | Freq: Once | INTRAMUSCULAR | Status: AC
  Administered 2022-09-19: 50 mg via INTRAVENOUS
  Filled 2022-09-19: qty 1

## 2022-09-19 MED ORDER — METHYLPREDNISOLONE SODIUM SUCC 125 MG IJ SOLR
80.0000 mg | Freq: Once | INTRAMUSCULAR | Status: AC
  Administered 2022-09-19: 80 mg via INTRAVENOUS
  Filled 2022-09-19: qty 2

## 2022-09-19 MED ORDER — LIDOCAINE HCL 1 % IJ SOLN
INTRAMUSCULAR | Status: AC
  Administered 2022-09-19: 10 mL
  Filled 2022-09-19: qty 20

## 2022-09-19 NOTE — Progress Notes (Signed)
Patient was seen for swelling involving the tongue and lower lip.   His BP is stable, he is saturating 100% on rm air, and denies dyspnea. There are no hives. He is not on an ACE-i.   Plan to treat suspected angioedema with steroids and antihistamines, continue close monitoring.

## 2022-09-19 NOTE — Progress Notes (Signed)
PROGRESS NOTE  Jacob Bass  DOB: [birth year]  PCP: Donia Ast., MD IRC:[phone]  DOA: 09/10/2022  LOS: 8 days  Hospital Day: 10  Brief narrative: Jacob Bass is a 62 y.o. male with PMH significant for history of stroke with residual left-sided deficits, HTN, liver cirrhosis, chronic anemia, COPD, recent diagnosis of COVID who lives at [facility]. 1/8, patient was sent to the ED for altered mental status and weakness.  Last known normal the previous night.  In the ED, patient was afebrile, hemodynamically stable Labs with creatinine elevated to 2.09, ammonia level elevated to 99 CT head unremarkable MRI brain positive for small acute infarct in the left coronary data CTA of head and neck showed focal stenosis at the right V1 and evidence suspicious for pseudoaneurysm Also showed moderate focal stenosis of the right P1 left P2.  Admitted to [facility] Neurology consulted for acute stroke  Subjective: Patient was seen and examined this morning.  Propped up in bed.  Looks tired lethargic today. Acknowledged my presence but not able to follow commands. Has significant abdominal firmness and distention. Tube feeding ongoing.  Assessment and plan: Acute CVA prior CVA with left-sided deficits Brought to the ED after patient was noted to be less interactive compared to her baseline.   MRI showed left acute coronary due to small infarct  Stroke workup completed.  Neurology consult appreciated Echo showed EF of 55 to 60% with no cardiac source of embolism A1c 4.5, LDL 74 PTA not on any antithrombotic or anticoagulant. Patient has been started on aspirin 81 mg daily.  No DAPT given thrombocytopenia. Statin not started because of underlying liver cirrhosis  Acute medical encephalopathy Hepatic encephalopathy Ammonia level was elevated to 99.  His altered mentation was probably partly due to hyperammonemia Currently getting lactic through tube feeding.  Ammonia level improving, trend as  below. Mental status still is compromised and seems to be worsening this morning.  Unable to have meaningful verbal conversation. Reported normal at baseline.    Liver cirrhosis with anasarca Hypoalbuminemia Essential hypertension Per history, patient has worsening edema since October PTA on metoprolol 25 mg twice daily, Lasix 40 mg daily, Amlodipine 5 mg daily, lisinopril 5 mg daily Currently all medicines are on hold He has significant anasarca 1/14, underwent paracentesis with removal of 4 L of ascitic fluid.   Abdominal distention and firmness has worsened. IR to try paracentesis again today.  Patient unable to give consent and he does not have any family member or legal guardian yet to consent.  At this time, it is medically necessary to do the procedure.  Discussed with IR Continue Protonix Recent Labs  Lab 09/13/22 0411 09/15/22 0539 09/16/22 0619 09/17/22 0515 09/18/22 0516 09/19/22 0639  AST 89* 79* 74*  --   --   --   ALT 53* 49* 46*  --   --   --   ALKPHOS 62 62 85  --   --   --   BILITOT 1.6* 1.7* 1.2  --   --   --   PROT 5.9* 6.4* 6.4*  --   --   --   ALBUMIN 1.7* 1.8* 1.9*  --   --   --   AMMONIA 57*  --  46*  --   --  29  PLT  --  74* 76* 73* 69* 85*   Acute kidney injury Baseline creatinine around 1.  Presented with creatinine of 2.08 Creatinine initially improved but for the last 2 days,  creatinine has been gradually worsening, 1.91 today.  Overall seems hypervolemic due to liver cirrhosis but may have for arterial volume status. Suspect hepatorenal syndrome Recent Labs    09/10/22 1000 09/11/22 1708 09/13/22 0411 09/15/22 0539 09/16/22 0619 09/17/22 0515 09/18/22 0516 09/19/22 0639  BUN 23 22 15 11 14 19  28* 39*  CREATININE 2.09* 1.77* 1.47* 1.31* 1.39* 1.31* 1.49* 1.91*   Hypokalemia/hypomagnesemia Potassium and magnesium level improved with replacement. Recent Labs  Lab 09/14/22 1700 09/15/22 0539 09/15/22 1757 09/16/22 0619 09/17/22 0515  09/18/22 0516 09/19/22 0639  K  --  3.4*  --  3.5 3.8 3.9 4.5  MG 1.7 1.6* 2.0 2.0  --   --   --   PHOS 3.0 3.0 2.8 2.6  --   --   --    Chronic macrocytic anemia Secondary to liver disease.  Hemoglobin stable between 8 and 9. Recent Labs    09/10/22 1115 09/10/22 1611 09/11/22 1708 09/15/22 0539 09/16/22 0619 09/17/22 0515 09/18/22 0516 09/19/22 0639  HGB  --   --    < > 8.5* 8.6* 8.5* 8.3* 7.6*  MCV  --   --    < > 100.8* 101.9* 101.6* 102.0* 102.2*  VITAMINB12  --  2,050*  --   --   --   --   --   --   FOLATE >40.0  --   --   --   --   --   --   --    < > = values in this interval not displayed.   COPD Continue bronchodilators  Dysphagia Core track feeding to continue Speech therapy following   Hypoglycemia Blood glucose value was low when patient was altered and unable to take oral intake.  Currently on core track feeding.  BPH Flomax   Mobility: Will obtain PT evaluation after mental status improvement  Goals of care   Code Status: Full Code    Scheduled Meds:  aspirin  325 mg Per Tube Daily   feeding supplement (PROSource TF20)  60 mL Per Tube Daily   free water  200 mL Per Tube Q4H   heparin injection (subcutaneous)  5,000 Units Subcutaneous Q8H   lidocaine (PF)  15 mL Intradermal Once   mometasone-formoterol  2 puff Inhalation BID   pantoprazole (PROTONIX) IV  40 mg Intravenous Q24H   tamsulosin  0.4 mg Oral Daily   thiamine  100 mg Per Tube Daily   umeclidinium bromide  1 puff Inhalation Daily    PRN meds: acetaminophen **OR** acetaminophen (TYLENOL) oral liquid 160 mg/5 mL **OR** acetaminophen, ipratropium-albuterol, senna-docusate   Infusions:   feeding supplement (JEVITY 1.5 CAL/FIBER) 1,000 mL (09/19/22 0008)    Skin assessment:     Nutritional status:  Body mass index is 27.8 kg/m.  Nutrition Problem: Moderate Malnutrition Etiology: chronic illness Signs/Symptoms: moderate fat depletion, severe muscle depletion     Diet:   Diet Order             Diet NPO time specified  Diet effective now                   DVT prophylaxis:  heparin injection 5,000 Units Start: 09/11/22 2330 SCD's Start: 09/10/22 1605   Antimicrobials: None currently Fluid: None currently Consultants: Palliative care, IR Family Communication: None at bedside  Status is: Inpatient  Continue in-hospital care because: Needs paracentesis done again today.  Mental status remains poor.  On core track feeding.   Level of  care: Telemetry Medical   Dispo: The patient is from: [facility] long-term care              Anticipated d/c is to: Poor prognosis.              Patient currently is not medically stable to d/c.   Difficult to place patient No    Antimicrobials: Anti-infectives (From admission, onward)    Start     Dose/Rate Route Frequency Ordered Stop   09/10/22 1345  cefTRIAXone (ROCEPHIN) 1 g in sodium chloride 0.9 % 100 mL IVPB        1 g 200 mL/hr over 30 Minutes Intravenous  Once 09/10/22 1331 09/10/22 1605   09/10/22 1345  azithromycin (ZITHROMAX) 500 mg in sodium chloride 0.9 % 250 mL IVPB        500 mg 250 mL/hr over 60 Minutes Intravenous  Once 09/10/22 1331 09/10/22 1605       Objective: Vitals:   09/19/22 0723 09/19/22 0815  BP: 129/65   Pulse: 85 85  Resp: 18 16  Temp: 98.4 F (36.9 C)   SpO2: 100% 100%    Intake/Output Summary (Last 24 hours) at 09/19/2022 1050 Last data filed at 09/18/2022 1627 Gross per 24 hour  Intake --  Output 200 ml  Net -200 ml   Filed Weights   09/15/22 0500 09/17/22 0500 09/18/22 0500  Weight: 80.9 kg 80.3 kg 80.5 kg   Weight change:  Body mass index is 27.8 kg/m.   Physical Exam: General exam: Middle-aged African-American male.  Looks lethargic today Skin: No rashes, lesions or ulcers. HEENT: Atraumatic, normocephalic, no obvious bleeding.  Core track feeding ongoing Lungs: Diminished air entry in both bases.  Moaning but not requiring supplemental  oxygen CVS: Regular rate and rhythm, no murmur GI/Abd worsening abdominal distention and firmness.  Nontender on touch. CNS: Eyes open.  Acknowledges my presence in the room.  Unable to follow command. Psychiatry: Sad affect Extremities: 2+ bilateral pedal edema, no calf tenderness  Data Review: I have personally reviewed the laboratory data and studies available.  F/u labs ordered Unresulted Labs (From admission, onward)     Start     Ordered   Unscheduled  CBC with Differential/Platelet  Tomorrow morning,   R       Question:  Specimen collection method  Answer:  Lab=Lab collect   09/19/22 1050   Unscheduled  Comprehensive metabolic panel  Tomorrow morning,   R       Question:  Specimen collection method  Answer:  Lab=Lab collect   09/19/22 1050            Total time spent in review of labs and imaging, patient evaluation, formulation of plan, documentation and communication with family: 45 minutes  Signed, Lorin Glass, MD [facility] 09/19/2022

## 2022-09-19 NOTE — Progress Notes (Signed)
Modified Barium Swallow Progress Note  Patient Details  Name: Jacob Bass MRN: [id number] Date of Birth: Nov 18, 1960  Today's Date: 09/19/2022  Modified Barium Swallow completed.  Full report located under Chart Review in the Imaging Section.  Brief recommendations include the following:  Clinical Impression  Patient unfortunately continues to exhibit a severe to profoundly impaired swallow with no improvement observed when comparing today's MBS to the one completed on 09/11/22. SLP assessed patient's swallow with a total of two spoon sips of thin liquid barium. Patient with little to no lingual movement and significantly reduced bilabial strength/closure leading to anterior spillage of thin liquid bolus on right side of mouth and limited anterior to posterior movement of thin liquid bolus in oral cavity. Patient did initiate a swallow during pharyngeal phase with delay at level of vallecular sinus and resulting in silent aspiration before the swallow was initiated. Patient with mild wheezing when study started and study was terminated prematurely due to patient exhibiting increased wheezing and congested, non productive coughing. SLP continues to recommend NPO and unfortunately not anticipating patient to have functional return of his swallows. Unfortunately, patient's oral phase of swallow is not functional and SLP cannot make any safe PO recommendations. No benefit at this time for direct therapeutic intervention however SLP will follow at a distance.    Swallow Evaluation Recommendations       SLP Diet Recommendations: NPO;Alternative means - long-term       Medication Administration: Other (Comment) (via long term means)               Oral Care Recommendations: Oral care QID;Staff/trained caregiver to provide oral care       Sonia Baller, MA, CCC-SLP Speech Therapy

## 2022-09-19 NOTE — Progress Notes (Signed)
PT Cancellation Note  Patient Details Name: Jacob Bass MRN: [id number] DOB: [birth year]   Cancelled Treatment:    Reason Eval/Treat Not Completed: (P) Patient at procedure or test/unavailable, AM: pt off unit at swallow study, PM off unit at IR, will continue to follow acutely.   Audry Riles. PTA Acute Rehabilitation Services Office: [place] 09/19/2022, 4:32 PM

## 2022-09-19 NOTE — TOC Progression Note (Signed)
Transition of Care [facility]) - Progression Note    Patient Details  Name: Jacob Bass MRN: [id number] Date of Birth: [birth year]  Transition of Care [facility]) CM/SW Contact  Jinger Neighbors, [place] Phone Number: 09/19/2022, 10:22 AM  Clinical Narrative:     CSW made contact with Elmyra Ricks, DSS APS Social Worker to discuss current status with report. Elmyra Ricks states she has consulted with her supervisor and still waiting to decide what they're going to do. CSW informed Elmyra Ricks Palliative has been consulted; however, they can not do anything with pt until someone has authority to make decisions on pt's behalf. Elmyra Ricks stated she will f/u with her Supervisor and return call to [place].        Expected Discharge Plan and Services                                               Social Determinants of Health (SDOH) Interventions    Readmission Risk Interventions     No data to display

## 2022-09-19 NOTE — Procedures (Signed)
PROCEDURE SUMMARY:  Successful US guided paracentesis from left lateral abdomen.  Yielded 5.8 liters of clear, yellow fluid.  No immediate complications.  Pt tolerated well.   Specimen was not sent for labs.  EBL < 58mL  Docia Barrier PA-C 09/19/2022 4:11 PM

## 2022-09-19 NOTE — Progress Notes (Signed)
   09/19/22 0253  Assess: MEWS Score  Temp 100.2 F (37.9 C)  BP (!) 151/92  MAP (mmHg) 112  Pulse Rate (!) 101  Resp (!) 24  SpO2 99 %  O2 Device Room Air  Assess: MEWS Score  MEWS Temp 0  MEWS Systolic 0  MEWS Pulse 1  MEWS RR 1  MEWS LOC 0  MEWS Score 2  MEWS Score Color Yellow  Assess: if the MEWS score is Yellow or Red  Were vital signs taken at a resting state? Yes  Focused Assessment Change from prior assessment (see assessment flowsheet)  Does the patient meet 2 or more of the SIRS criteria? Yes  Does the patient have a confirmed or suspected source of infection? No  Provider and Rapid Response Notified? Yes  MEWS guidelines implemented *See Row Information* Yes  Treat  MEWS Interventions Administered prn meds/treatments  Pain Scale 0-10  Pain Score 0  Take Vital Signs  Increase Vital Sign Frequency  Yellow: Q 2hr X 2 then Q 4hr X 2, if remains yellow, continue Q 4hrs  Escalate  MEWS: Escalate Yellow: discuss with charge nurse/RN and consider discussing with provider and RRT  Notify: Charge Nurse/RN  Name of Charge Nurse/RN Notified Webb Silversmith  Date Charge Nurse/RN Notified 09/19/22  Time Charge Nurse/RN Notified 0255  Provider Notification  Provider Name/Title Opyd,MD  Date Provider Notified 09/19/22  Time Provider Notified 0300  Method of Notification Page  Notification Reason Change in status  Provider response En route  Date of Provider Response 09/19/22  Time of Provider Response 0310  Notify: Rapid Response  Name of Rapid Response RN Notified David  Date Rapid Response Notified 09/19/22  Time Rapid Response Notified 0255  Document  Patient Outcome Stabilized after interventions  Progress note created (see row info) Yes  Assess: SIRS CRITERIA  SIRS Temperature  0  SIRS Pulse 1  SIRS Respirations  1  SIRS WBC 0  SIRS Score Sum  2

## 2022-09-19 NOTE — Plan of Care (Signed)
  Problem: Education: Goal: Knowledge of disease or condition will improve Outcome: Not Progressing Goal: Knowledge of secondary prevention will improve (MUST DOCUMENT ALL) Outcome: Not Progressing Goal: Knowledge of patient specific risk factors will improve (Mark N/A or DELETE if not current risk factor) Outcome: Not Progressing   Problem: Ischemic Stroke/TIA Tissue Perfusion: Goal: Complications of ischemic stroke/TIA will be minimized Outcome: Not Progressing   Problem: Coping: Goal: Will verbalize positive feelings about self Outcome: Not Progressing Goal: Will identify appropriate support needs Outcome: Not Progressing   Problem: Health Behavior/Discharge Planning: Goal: Ability to manage health-related needs will improve Outcome: Not Progressing Goal: Goals will be collaboratively established with patient/family Outcome: Not Progressing   Problem: Self-Care: Goal: Ability to participate in self-care as condition permits will improve Outcome: Not Progressing Goal: Verbalization of feelings and concerns over difficulty with self-care will improve Outcome: Not Progressing Goal: Ability to communicate needs accurately will improve Outcome: Not Progressing   Problem: Nutrition: Goal: Risk of aspiration will decrease Outcome: Not Progressing Goal: Dietary intake will improve Outcome: Not Progressing   Problem: Education: Goal: Knowledge of General Education information will improve Description: Including pain rating scale, medication(s)/side effects and non-pharmacologic comfort measures Outcome: Not Progressing   Problem: Health Behavior/Discharge Planning: Goal: Ability to manage health-related needs will improve Outcome: Not Progressing   Problem: Clinical Measurements: Goal: Ability to maintain clinical measurements within normal limits will improve Outcome: Not Progressing Goal: Will remain free from infection Outcome: Not Progressing Goal: Diagnostic test  results will improve Outcome: Not Progressing Goal: Respiratory complications will improve Outcome: Not Progressing Goal: Cardiovascular complication will be avoided Outcome: Not Progressing   Problem: Activity: Goal: Risk for activity intolerance will decrease Outcome: Not Progressing   Problem: Nutrition: Goal: Adequate nutrition will be maintained Outcome: Not Progressing   Problem: Coping: Goal: Level of anxiety will decrease Outcome: Not Progressing   Problem: Elimination: Goal: Will not experience complications related to bowel motility Outcome: Not Progressing Goal: Will not experience complications related to urinary retention Outcome: Not Progressing   Problem: Pain Managment: Goal: General experience of comfort will improve Outcome: Not Progressing   Problem: Safety: Goal: Ability to remain free from injury will improve Outcome: Not Progressing   Problem: Skin Integrity: Goal: Risk for impaired skin integrity will decrease Outcome: Not Progressing   

## 2022-09-19 NOTE — Progress Notes (Signed)
  IR requested for paracentesis, however patient cannot consent for himself currently due to encephalopathy and he does not have family, next of kin, healthcare POA, currently DDS working on obtaining guardianship.   Discussed with Dr. Pietro Cassis, patient is in need of urgent paracentesis due to worsening of abdominal distention.   Emergent consent received from Dr. Pietro Cassis, Dr. Serafina Royals agrees that patient is in need of urgent paracentesis.   IR will proceed with paracentesis today.  Armando Gang Jekhi Bolin PA-C 09/19/2022 11:43 AM

## 2022-09-20 DIAGNOSIS — I639 Cerebral infarction, unspecified: Secondary | ICD-10-CM | POA: Diagnosis not present

## 2022-09-20 LAB — COMPREHENSIVE METABOLIC PANEL
ALT: 39 U/L (ref 0–44)
AST: 70 U/L — ABNORMAL HIGH (ref 15–41)
Albumin: 1.9 g/dL — ABNORMAL LOW (ref 3.5–5.0)
Alkaline Phosphatase: 71 U/L (ref 38–126)
Anion gap: 6 (ref 5–15)
BUN: 50 mg/dL — ABNORMAL HIGH (ref 8–23)
CO2: 22 mmol/L (ref 22–32)
Calcium: 8 mg/dL — ABNORMAL LOW (ref 8.9–10.3)
Chloride: 111 mmol/L (ref 98–111)
Creatinine, Ser: 2.12 mg/dL — ABNORMAL HIGH (ref 0.61–1.24)
GFR, Estimated: 34 mL/min — ABNORMAL LOW (ref >60)
Glucose, Bld: 115 mg/dL — ABNORMAL HIGH (ref 70–99)
Potassium: 4.6 mmol/L (ref 3.5–5.1)
Sodium: 139 mmol/L (ref 135–145)
Total Bilirubin: 1 mg/dL (ref 0.3–1.2)
Total Protein: 6.9 g/dL (ref 6.5–8.1)

## 2022-09-20 LAB — CBC WITH DIFFERENTIAL/PLATELET
Abs Immature Granulocytes: 0.05 10*3/uL (ref 0.00–0.07)
Basophils Absolute: 0 10*3/uL (ref 0.0–0.1)
Basophils Relative: 0 %
Eosinophils Absolute: 0 10*3/uL (ref 0.0–0.5)
Eosinophils Relative: 0 %
HCT: 25.3 % — ABNORMAL LOW (ref 39.0–52.0)
Hemoglobin: 8.3 g/dL — ABNORMAL LOW (ref 13.0–17.0)
Immature Granulocytes: 1 %
Lymphocytes Relative: 8 %
Lymphs Abs: 0.7 10*3/uL (ref 0.7–4.0)
MCH: 33.5 pg (ref 26.0–34.0)
MCHC: 32.8 g/dL (ref 30.0–36.0)
MCV: 102 fL — ABNORMAL HIGH (ref 80.0–100.0)
Monocytes Absolute: 0.9 10*3/uL (ref 0.1–1.0)
Monocytes Relative: 10 %
Neutro Abs: 7.4 10*3/uL (ref 1.7–7.7)
Neutrophils Relative %: 81 %
Platelets: 77 10*3/uL — ABNORMAL LOW (ref 150–400)
RBC: 2.48 MIL/uL — ABNORMAL LOW (ref 4.22–5.81)
RDW: 18.5 % — ABNORMAL HIGH (ref 11.5–15.5)
WBC: 9 10*3/uL (ref 4.0–10.5)
nRBC: 0 % (ref 0.0–0.2)

## 2022-09-20 LAB — GLUCOSE, CAPILLARY
Glucose-Capillary: 104 mg/dL — ABNORMAL HIGH (ref 70–99)
Glucose-Capillary: 108 mg/dL — ABNORMAL HIGH (ref 70–99)
Glucose-Capillary: 111 mg/dL — ABNORMAL HIGH (ref 70–99)
Glucose-Capillary: 117 mg/dL — ABNORMAL HIGH (ref 70–99)
Glucose-Capillary: 119 mg/dL — ABNORMAL HIGH (ref 70–99)
Glucose-Capillary: 99 mg/dL (ref 70–99)

## 2022-09-20 MED ORDER — STERILE WATER FOR INJECTION IJ SOLN
INTRAMUSCULAR | Status: AC
  Administered 2022-09-20: 10 mL
  Filled 2022-09-20: qty 10

## 2022-09-20 NOTE — Progress Notes (Signed)
Physical Therapy Treatment Patient Details Name: Jacob Bass MRN: [id number] DOB: 1961/08/26 Today's Date: 09/20/2022   History of Present Illness 62 y.o. male presents to [facility] on 09/10/2022 with AMS and L weakness from [facility]. 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 Complicated by fluid overload. Paracentesis performed 1/14 and 1/16. PMH includes CVA, COPD, cirrhosis, gastric ulcer.    PT Comments    Pt greeted semi-reclined in bed and agreeable to session, however pt continues to be limited by increased abdominal distention and pain, L hemi weakness, and decreased activity tolerance. Pt able to come to sitting EOB with mod assist and demonstrating improved sitting balance, however pt leaning posterior and propping on RUE as pt unable to tolerate upright sitting secondary to abdominal pain/discomfort. Transfers deferred this date as pt unable to tolerate anterior flexion of trunk to initiate transfer to stand and unable to tolerate full upright sitting in chair. Current plan remains appropriate to address deficits and maximize functional independence and decrease caregiver burden. Pt continues to benefit from skilled PT services to progress toward functional mobility goals.     Recommendations for follow up therapy are one component of a multi-disciplinary discharge planning process, led by the attending physician.  Recommendations may be updated based on patient status, additional functional criteria and insurance authorization.  Follow Up Recommendations  Skilled nursing-short term rehab (<3 hours/day) Can patient physically be transported by private vehicle: No   Assistance Recommended at Discharge Frequent or constant Supervision/Assistance  Patient can return home with the following Two people to help with walking and/or transfers;Two people to help with bathing/dressing/bathroom;Assistance with cooking/housework;Assistance with feeding;Direct supervision/assist for  medications management;Direct supervision/assist for financial management;Assist for transportation;Help with stairs or ramp for entrance   Equipment Recommendations   (defer to post-acute)    Recommendations for Other Services       Precautions / Restrictions Precautions Precautions: Fall Precaution Comments: cortrak Restrictions Weight Bearing Restrictions: No     Mobility  Bed Mobility Overal bed mobility: Needs Assistance Bed Mobility: Supine to Sit, Sit to Supine, Rolling     Supine to sit: Max assist, HOB elevated Sit to supine: Max assist   General bed mobility comments: max a to initiate mobility with pt able to use RUE to pull to come to sit    Transfers                   General transfer comment: deferred 2/2 incrfeased pain in abdomen in sitting with pt leaning posterior to reduce and pt not tolerating upright sitting or leaning anterior to inititate stand    Ambulation/Gait                   Stairs             Wheelchair Mobility    Modified Rankin (Stroke Patients Only) Modified Rankin (Stroke Patients Only) Pre-Morbid Rankin Score: Moderate disability Modified Rankin: Severe disability     Balance Overall balance assessment: Needs assistance Sitting-balance support: Single extremity supported, Feet supported Sitting balance-Leahy Scale: Poor Sitting balance - Comments: heavily reliant on R UE to footboard to maintain balance. unable to sustain unsupported sitting < 3 sec Postural control: Posterior lean                                  Cognition Arousal/Alertness: Awake/alert Behavior During Therapy: Flat affect Overall Cognitive Status: No family/caregiver present to determine  baseline cognitive functioning                                 General Comments: pt follows commands well with increased time, mostly nonverbal today        Exercises General Exercises - Lower Extremity Ankle  Circles/Pumps: AROM, AAROM, Both, 10 reps, Supine Heel Slides: AROM, AAROM, Both, 10 reps, Supine Hip ABduction/ADduction: AROM, Right, 5 reps, Supine    General Comments General comments (skin integrity, edema, etc.): Noted wheezing noises througout, increased in sitting      Pertinent Vitals/Pain Pain Assessment Pain Assessment: Faces Faces Pain Scale: Hurts even more Pain Location: abdomen in sitting Pain Descriptors / Indicators: Guarding, Grimacing Pain Intervention(s): Monitored during session, Limited activity within patient's tolerance, Repositioned    Home Living                          Prior Function            PT Goals (current goals can now be found in the care plan section) Acute Rehab PT Goals PT Goal Formulation: With patient Time For Goal Achievement: 09/24/22 Progress towards PT goals: Not progressing toward goals - comment (pain)    Frequency    Min 3X/week      PT Plan      Co-evaluation              AM-PAC PT "6 Clicks" Mobility   Outcome Measure  Help needed turning from your back to your side while in a flat bed without using bedrails?: A Lot Help needed moving from lying on your back to sitting on the side of a flat bed without using bedrails?: A Lot Help needed moving to and from a bed to a chair (including a wheelchair)?: Total Help needed standing up from a chair using your arms (e.g., wheelchair or bedside chair)?: Total Help needed to walk in hospital room?: Total Help needed climbing 3-5 steps with a railing? : Total 6 Click Score: 8    End of Session   Activity Tolerance: Patient limited by pain Patient left: with call bell/phone within reach;in bed;with bed alarm set Nurse Communication: Mobility status PT Visit Diagnosis: Other abnormalities of gait and mobility (R26.89);Muscle weakness (generalized) (M62.81);Hemiplegia and hemiparesis Hemiplegia - Right/Left: Left Hemiplegia - caused by: Cerebral infarction      Time: 1610-9604 PT Time Calculation (min) (ACUTE ONLY): 16 min  Charges:  $Therapeutic Activity: 8-22 mins                     [name] R. PTA Acute Rehabilitation Services Office: [phone]    Catalina Antigua 09/20/2022, 3:24 PM

## 2022-09-20 NOTE — Plan of Care (Signed)
  Problem: Education: Goal: Knowledge of disease or condition will improve Outcome: Not Progressing Goal: Knowledge of secondary prevention will improve (MUST DOCUMENT ALL) Outcome: Not Progressing Goal: Knowledge of patient specific risk factors will improve (Mark N/A or DELETE if not current risk factor) Outcome: Not Progressing   

## 2022-09-20 NOTE — Progress Notes (Signed)
Nutrition Follow-up  DOCUMENTATION CODES:  Non-severe (moderate) malnutrition in context of chronic illness  INTERVENTION:  Continue tube feeding via cortrak: Jevity 1.5 at 60 ml/h (1440 ml per day) Prosource TF20 60 ml 1x/d Free water 242mL q4h per MD Provides 2060 kcal, 104 gm protein, 1003 ml free water daily (2249mL TF+flush) Monitor guardianship status  NUTRITION DIAGNOSIS:  Moderate Malnutrition related to chronic illness as evidenced by moderate fat depletion, severe muscle depletion. - remains applicable  GOAL:  Patient will meet greater than or equal to 90% of their needs - being met with TF at goal  MONITOR:  Diet advancement, Labs, I & O's, TF tolerance  REASON FOR ASSESSMENT:  New TF (on cortrak list)    ASSESSMENT:  Pt with hx of HTN, COPD, cirrhosis, hx of etOH abuse, dementia, and prior CVA presented to ED from his nursing facility with AMS and weakness. Found to have suffered an acute infarct.   1/9 - MBS, NPO 1/14 - paracentesis, 3.8L of clear yellow fluid removed 1/17 - MBS, NPO, SLP signed off on acute treatment, noted that oral phase of swallow is not functional, paracentesis, 5.8L removed  Pt remains on TF at goal via cortrak with AMS. Per SLP, will likely not regain swallowing function. Would recommend moving forward with exploring long-term feeding options as he will not be able to be discharged with a cortrak tube. However, pt with significant ascites and quick build up of fluid requiring 2 paracentesis in the last 3 days. Unsure if pt would even be a candidate for a PEG or surgically placed g-tube. Reached out to provider and CM regarding this barrier to discharge.   Pt with overall poor prognosis.   Nutritionally Relevant Medications: Scheduled Meds:  PROSource TF20  60 mL Per Tube Daily   free water  200 mL Per Tube Q4H   pantoprazole IV  40 mg Intravenous Q24H   thiamine  100 mg Per Tube Daily   Continuous Infusions:  JEVITY 1.5 CAL/FIBER)  1,000 mL (09/19/22 0008)   PRN Meds: senna-docusate  Labs Reviewed: BUN 50, creatinine 2.12  NUTRITION - FOCUSED PHYSICAL EXAM: Flowsheet Row Most Recent Value  Orbital Region Mild depletion  Upper Arm Region Moderate depletion  Thoracic and Lumbar Region Moderate depletion  Buccal Region Mild depletion  Temple Region Mild depletion  Clavicle Bone Region Mild depletion  Clavicle and Acromion Bone Region Severe depletion  Scapular Bone Region Moderate depletion  Dorsal Hand Severe depletion  Patellar Region No depletion  Anterior Thigh Region No depletion  Posterior Calf Region No depletion  Edema (RD Assessment) Moderate  [significant edema to the BLE]  Hair Reviewed  Eyes Reviewed  Mouth Reviewed  Skin Reviewed  Nails Reviewed    Diet Order:   Diet Order             Diet NPO time specified  Diet effective now                   EDUCATION NEEDS:  Not appropriate for education at this time  Skin:  Skin Assessment: Reviewed RN Assessment  Last BM:  1/17  Height:  Ht Readings from Last 1 Encounters:  09/10/22 5\' 7"  (1.702 m)    Weight:  Wt Readings from Last 1 Encounters:  09/20/22 77.4 kg    Ideal Body Weight:  67.3 kg  BMI:  Body mass index is 26.73 kg/m.  Estimated Nutritional Needs:  Kcal:  1900-2100 kcal/d Protein:  90-105g/d Fluid:  2L/d  Ranell Patrick, RD, LDN Clinical Dietitian RD pager # available in [place]  After hours/weekend pager # available in [facility]

## 2022-09-20 NOTE — Plan of Care (Signed)
  Problem: Ischemic Stroke/TIA Tissue Perfusion: Goal: Complications of ischemic stroke/TIA will be minimized Outcome: Progressing   Problem: Education: Goal: Knowledge of disease or condition will improve Outcome: Progressing   Problem: Education: Goal: Knowledge of secondary prevention will improve (MUST DOCUMENT ALL) Outcome: Progressing   Problem: Education: Goal: Knowledge of patient specific risk factors will improve Elta [place] N/A or DELETE if not current risk factor) Outcome: Progressing   Problem: Self-Care: Goal: Ability to participate in self-care as condition permits will improve Outcome: Progressing   Problem: Nutrition: Goal: Risk of aspiration will decrease Outcome: Progressing   Problem: Nutrition: Goal: Dietary intake will improve Outcome: Progressing   Problem: Self-Care: Goal: Ability to participate in self-care as condition permits will improve Outcome: Progressing   Problem: Education: Goal: Knowledge of General Education information will improve Description: Including pain rating scale, medication(s)/side effects and non-pharmacologic comfort measures Outcome: Progressing

## 2022-09-20 NOTE — Progress Notes (Signed)
PROGRESS NOTE  Jacob Bass  DOB: 1961-02-20  PCP: Donia Ast., MD HQI:[id number]  DOA: 09/10/2022  LOS: 9 days  Hospital Day: 11  Brief narrative: Jacob Bass is a 62 y.o. male with PMH significant for history of stroke with residual left-sided deficits, HTN, liver cirrhosis, chronic anemia, COPD, recent diagnosis of COVID who lives at [facility]. 1/8, patient was sent to the ED for altered mental status and weakness.  Last known normal the previous night.  In the ED, patient was afebrile, hemodynamically stable Labs with creatinine elevated to 2.09, ammonia level elevated to 99 CT head unremarkable MRI brain positive for small acute infarct in the left coronary data CTA of head and neck showed focal stenosis at the right V1 and evidence suspicious for pseudoaneurysm Also showed moderate focal stenosis of the right P1 left P2.  Admitted to [facility] Neurology consulted for acute stroke  Subjective: Patient was seen and examined this morning.   Alert, awake, nods head to answer some questions.  Mumbles.  Could not respond but able to follow some motor commands. Had 5.8 L of paracentesis done yesterday.  Seems to be reaccumulating again already.  Assessment and plan: Acute CVA prior CVA with left-sided deficits Brought to the ED after patient was noted to be less interactive compared to her baseline.   MRI showed left acute coronary due to small infarct  Stroke workup completed.  Neurology consult appreciated Echo showed EF of 55 to 60% with no cardiac source of embolism A1c 4.5, LDL 74 PTA not on any antithrombotic or anticoagulant. Patient has been started on aspirin 81 mg daily.  No DAPT given thrombocytopenia. Statin not started because of underlying liver cirrhosis  Acute medical encephalopathy Hepatic encephalopathy Ammonia level was elevated to 99.  His altered mentation was probably partly due to hyperammonemia Currently getting lactic through tube feeding.  Ammonia  level improving, trend as below. Mental status still is compromised.  Slow to respond.  Nods head to answer simple yes/no questions.  Able to follow some motor commands.  Not oriented to place, person or time  Liver cirrhosis with anasarca Hypoalbuminemia Essential hypertension Per history, patient has worsening edema since October PTA on metoprolol 25 mg twice daily, Lasix 40 mg daily, Amlodipine 5 mg daily, lisinopril 5 mg daily Currently all medicines are on hold He has significant anasarca. Unable to consent but because of medical city, paracentesis has been done twice 1/14, 4 L removed.  1/17, almost 6 L removed. Continue Protonix Recent Labs  Lab 09/15/22 0539 09/16/22 0619 09/17/22 0515 09/18/22 0516 09/19/22 0639 09/20/22 0405  AST 79* 74*  --   --   --  70*  ALT 49* 46*  --   --   --  39  ALKPHOS 62 85  --   --   --  71  BILITOT 1.7* 1.2  --   --   --  1.0  PROT 6.4* 6.4*  --   --   --  6.9  ALBUMIN 1.8* 1.9*  --   --   --  1.9*  AMMONIA  --  46*  --   --  29  --   PLT 74* 76* 73* 69* 85* 77*   Acute kidney injury Baseline creatinine around 1.  Presented with creatinine of 2.08 Creatinine initially improved but for the last 2 days, creatinine has been gradually worsening, 2.12 today.  Overall seems hypervolemic due to liver cirrhosis but may have low arterial volume status.  Urine output not improving Suspect hepatorenal syndrome.  Recent Labs    09/10/22 1000 09/11/22 1708 09/13/22 0411 09/15/22 0539 09/16/22 2952 09/17/22 0515 09/18/22 0516 09/19/22 0639 09/20/22 0405  BUN 23 22 15 11 14 19  28* 39* 50*  CREATININE 2.09* 1.77* 1.47* 1.31* 1.39* 1.31* 1.49* 1.91* 2.12*   Hypokalemia/hypomagnesemia Potassium and magnesium level improved with replacement. Recent Labs  Lab 09/14/22 1700 09/15/22 0539 09/15/22 0539 09/15/22 1757 09/16/22 8413 09/17/22 0515 09/18/22 0516 09/19/22 0639 09/20/22 0405  K  --  3.4*   < >  --  3.5 3.8 3.9 4.5 4.6  MG 1.7  1.6*  --  2.0 2.0  --   --   --   --   PHOS 3.0 3.0  --  2.8 2.6  --   --   --   --    < > = values in this interval not displayed.   Chronic macrocytic anemia Secondary to liver disease.  Hemoglobin stable between 8 and 9. Recent Labs    09/10/22 1115 09/10/22 1611 09/11/22 1708 09/16/22 0619 09/17/22 0515 09/18/22 0516 09/19/22 0639 09/20/22 0405  HGB  --   --    < > 8.6* 8.5* 8.3* 7.6* 8.3*  MCV  --   --    < > 101.9* 101.6* 102.0* 102.2* 102.0*  VITAMINB12  --  2,050*  --   --   --   --   --   --   FOLATE >40.0  --   --   --   --   --   --   --    < > = values in this interval not displayed.   COPD Continue bronchodilators  Dysphagia Core track feeding to continue.  Poor prognosis and frequent reaccumulation of ascites to consider PEG tube placement Speech therapy following   Hypoglycemia Blood glucose value was low when patient was altered and unable to take oral intake.  Currently on core track feeding.  BPH Flomax   Mobility: Will obtain PT evaluation after mental status improvement  Goals of care   Code Status: Full Code    Scheduled Meds:  aspirin  325 mg Per Tube Daily   feeding supplement (PROSource TF20)  60 mL Per Tube Daily   free water  200 mL Per Tube Q4H   heparin injection (subcutaneous)  5,000 Units Subcutaneous Q8H   lidocaine (PF)  15 mL Intradermal Once   mometasone-formoterol  2 puff Inhalation BID   pantoprazole (PROTONIX) IV  40 mg Intravenous Q24H   tamsulosin  0.4 mg Oral Daily   thiamine  100 mg Per Tube Daily   umeclidinium bromide  1 puff Inhalation Daily    PRN meds: acetaminophen **OR** acetaminophen (TYLENOL) oral liquid 160 mg/5 mL **OR** acetaminophen, ipratropium-albuterol, senna-docusate   Infusions:   feeding supplement (JEVITY 1.5 CAL/FIBER) 1,000 mL (09/19/22 0008)    Skin assessment:     Nutritional status:  Body mass index is 26.73 kg/m.  Nutrition Problem: Moderate Malnutrition Etiology: chronic  illness Signs/Symptoms: moderate fat depletion, severe muscle depletion     Diet:  Diet Order             Diet NPO time specified  Diet effective now                   DVT prophylaxis:  heparin injection 5,000 Units Start: 09/11/22 2330 SCD's Start: 09/10/22 1605   Antimicrobials: None currently Fluid: None currently Consultants: Palliative care, IR Family  Communication: None at bedside  Status is: Inpatient  Continue in-hospital care because:  On core track feeding..  Ascites is already accommodating.  May need paracentesis again. Level of care: Telemetry Medical   Dispo: The patient is from: [facility] long-term care              Anticipated d/c is to: Poor prognosis.              Patient currently is not medically stable to d/c.   Difficult to place patient No    Antimicrobials: Anti-infectives (From admission, onward)    Start     Dose/Rate Route Frequency Ordered Stop   09/10/22 1345  cefTRIAXone (ROCEPHIN) 1 g in sodium chloride 0.9 % 100 mL IVPB        1 g 200 mL/hr over 30 Minutes Intravenous  Once 09/10/22 1331 09/10/22 1605   09/10/22 1345  azithromycin (ZITHROMAX) 500 mg in sodium chloride 0.9 % 250 mL IVPB        500 mg 250 mL/hr over 60 Minutes Intravenous  Once 09/10/22 1331 09/10/22 1605       Objective: Vitals:   09/20/22 0800 09/20/22 1158  BP: 138/73 (!) 147/89  Pulse: 85 85  Resp: (!) 21 20  Temp: 97.7 F (36.5 C) 97.7 F (36.5 C)  SpO2: 100% 100%    Intake/Output Summary (Last 24 hours) at 09/20/2022 1459 Last data filed at 09/19/2022 1656 Gross per 24 hour  Intake --  Output 150 ml  Net -150 ml   Filed Weights   09/17/22 0500 09/18/22 0500 09/20/22 0500  Weight: 80.3 kg 80.5 kg 77.4 kg   Weight change:  Body mass index is 26.73 kg/m.   Physical Exam: General exam: Middle-aged African-American male.  Not in pain Skin: No rashes, lesions or ulcers. HEENT: Atraumatic, normocephalic, no obvious bleeding.  Core track  feeding ongoing Lungs: Diminished air entry in both bases.  Moaning but not requiring supplemental oxygen CVS: Regular rate and rhythm, no murmur GI/Abd worsening abdominal distention and firmness.  Nontender on touch. CNS: Alert, awake, not oriented.  Able to answer simple yes/no questions by nodding head. Psychiatry: Sad affect Extremities: 2+ bilateral pedal edema, no calf tenderness  Data Review: I have personally reviewed the laboratory data and studies available.  F/u labs ordered Unresulted Labs (From admission, onward)     Start     Ordered   09/21/22 0500  CBC with Differential/Platelet  Tomorrow morning,   R       Question:  Specimen collection method  Answer:  Lab=Lab collect   09/20/22 1459   09/21/22 0500  Basic metabolic panel  Tomorrow morning,   R       Question:  Specimen collection method  Answer:  Lab=Lab collect   09/20/22 1459            Total time spent in review of labs and imaging, patient evaluation, formulation of plan, documentation and communication with family: 45 minutes  Signed, Lorin Glass, MD [facility] 09/20/2022

## 2022-09-20 NOTE — TOC Progression Note (Signed)
Transition of Care [facility]) - Progression Note    Patient Details  Name: Jacob Bass MRN: [id number] Date of Birth: Feb 17, 1961  Transition of Care [place]) CM/SW Contact  Jinger Neighbors, [place] Phone Number: 09/20/2022, 1:47 PM  Clinical Narrative:     CSW called Social Worker- Elmyra Ricks, who reports she is currently visiting with pt. CSW reviewed concerns of MD and Speech regarding pt's inability to swallow and poor prognosis, so peg may not be appropriate at this time. Elmyra Ricks states she is going to meet with the attorney and attempt to have paperwork filed expeditiously. She will f/u with CSW.         Expected Discharge Plan and Services                                               Social Determinants of Health (SDOH) Interventions    Readmission Risk Interventions     No data to display

## 2022-09-21 DIAGNOSIS — I639 Cerebral infarction, unspecified: Secondary | ICD-10-CM | POA: Diagnosis not present

## 2022-09-21 LAB — BASIC METABOLIC PANEL
Anion gap: 8 (ref 5–15)
BUN: 58 mg/dL — ABNORMAL HIGH (ref 8–23)
CO2: 24 mmol/L (ref 22–32)
Calcium: 8 mg/dL — ABNORMAL LOW (ref 8.9–10.3)
Chloride: 110 mmol/L (ref 98–111)
Creatinine, Ser: 2 mg/dL — ABNORMAL HIGH (ref 0.61–1.24)
GFR, Estimated: 37 mL/min — ABNORMAL LOW (ref >60)
Glucose, Bld: 110 mg/dL — ABNORMAL HIGH (ref 70–99)
Potassium: 4 mmol/L (ref 3.5–5.1)
Sodium: 142 mmol/L (ref 135–145)

## 2022-09-21 LAB — CBC WITH DIFFERENTIAL/PLATELET
Abs Immature Granulocytes: 0.1 10*3/uL — ABNORMAL HIGH (ref 0.00–0.07)
Basophils Absolute: 0 10*3/uL (ref 0.0–0.1)
Basophils Relative: 0 %
Eosinophils Absolute: 0 10*3/uL (ref 0.0–0.5)
Eosinophils Relative: 0 %
HCT: 27.4 % — ABNORMAL LOW (ref 39.0–52.0)
Hemoglobin: 8.8 g/dL — ABNORMAL LOW (ref 13.0–17.0)
Immature Granulocytes: 1 %
Lymphocytes Relative: 10 %
Lymphs Abs: 1 10*3/uL (ref 0.7–4.0)
MCH: 33.5 pg (ref 26.0–34.0)
MCHC: 32.1 g/dL (ref 30.0–36.0)
MCV: 104.2 fL — ABNORMAL HIGH (ref 80.0–100.0)
Monocytes Absolute: 1.7 10*3/uL — ABNORMAL HIGH (ref 0.1–1.0)
Monocytes Relative: 17 %
Neutro Abs: 6.9 10*3/uL (ref 1.7–7.7)
Neutrophils Relative %: 72 %
Platelets: 92 10*3/uL — ABNORMAL LOW (ref 150–400)
RBC: 2.63 MIL/uL — ABNORMAL LOW (ref 4.22–5.81)
RDW: 19.5 % — ABNORMAL HIGH (ref 11.5–15.5)
WBC: 9.7 10*3/uL (ref 4.0–10.5)
nRBC: 0 % (ref 0.0–0.2)

## 2022-09-21 LAB — GLUCOSE, CAPILLARY
Glucose-Capillary: 101 mg/dL — ABNORMAL HIGH (ref 70–99)
Glucose-Capillary: 112 mg/dL — ABNORMAL HIGH (ref 70–99)
Glucose-Capillary: 116 mg/dL — ABNORMAL HIGH (ref 70–99)
Glucose-Capillary: 94 mg/dL (ref 70–99)
Glucose-Capillary: 94 mg/dL (ref 70–99)
Glucose-Capillary: 97 mg/dL (ref 70–99)

## 2022-09-21 NOTE — Progress Notes (Signed)
IR asked to evaluate this patient for possible g-tube placement. Due to recurrent ascites and anasarca this patient is not a candidate for percutaneous gastrostomy tube placement in IR.   Soyla Dryer, [place] [phone] 09/21/2022, 2:50 PM

## 2022-09-21 NOTE — Progress Notes (Signed)
Occupational Therapy Treatment Patient Details Name: Jacob Bass MRN: [id number] DOB: July 02, 1961 Today's Date: 09/21/2022   History of present illness 62 y.o. male presents to [facility] hospital on 09/10/2022 with AMS and L weakness from [facility]. 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 Complicated by fluid overload. Paracentesis performed 1/14 and 1/16. PMH includes CVA, COPD, cirrhosis, gastric ulcer.   OT comments  Pt. Seen for skilled OT treatment session.  Bed mobility max a x2.  PROM BUEs as introduction to HEP.  Pt. Following well and was able to assist with R UE in more active pursuits.  Pt. Declined oob/eob wanting to remain bed level for session and following session.  Will cont. With current poc.  Agree with current d/c recommendations.      Recommendations for follow up therapy are one component of a multi-disciplinary discharge planning process, led by the attending physician.  Recommendations may be updated based on patient status, additional functional criteria and insurance authorization.    Follow Up Recommendations  Skilled nursing-short term rehab (<3 hours/day)     Assistance Recommended at Discharge Frequent or constant Supervision/Assistance  Patient can return home with the following  Two people to help with walking and/or transfers;Two people to help with bathing/dressing/bathroom   Equipment Recommendations  None recommended by OT    Recommendations for Other Services      Precautions / Restrictions Precautions Precautions: Fall Precaution Comments: cortrak       Mobility Bed Mobility Overal bed mobility: Needs Assistance   Rolling: Max assist, Total assist, +2 for physical assistance         General bed mobility comments: max a with myself and cna to roll L/R for linen change.  pt. able to attept for reach to bed rail to assist    Transfers                         Balance                                            ADL either performed or assessed with clinical judgement   ADL Overall ADL's : Needs assistance/impaired                 Upper Body Dressing : Maximal assistance;Bed level                     General ADL Comments: ub dressing, bed mobility, and HEP BUES    Extremity/Trunk Assessment              Vision       Perception     Praxis      Cognition Arousal/Alertness: Awake/alert Behavior During Therapy: WFL for tasks assessed/performed Overall Cognitive Status: No family/caregiver present to determine baseline cognitive functioning                                 General Comments: pt follows commands well with increased time, answering questions with increased time        Exercises General Exercises - Upper Extremity Shoulder Flexion: PROM, Both, 5 reps, Supine Elbow Flexion: PROM, Both, 5 reps Elbow Extension: PROM, Both, 5 reps Wrist Flexion: PROM, Both, 5 reps, Supine Wrist Extension: PROM, Both, 5 reps, Supine Digit Composite Flexion: PROM, Both,  5 reps, Supine Composite Extension: PROM, Both, 5 reps, Supine    Shoulder Instructions       General Comments      Pertinent Vitals/ Pain       Pain Assessment Pain Assessment: No/denies pain  Home Living                                          Prior Functioning/Environment              Frequency  Min 2X/week        Progress Toward Goals  OT Goals(current goals can now be found in the care plan section)  Progress towards OT goals: Progressing toward goals     Plan Discharge plan remains appropriate    Co-evaluation                 AM-PAC OT "6 Clicks" Daily Activity     Outcome Measure   Help from another person eating meals?: Total Help from another person taking care of personal grooming?: A Little Help from another person toileting, which includes using toliet, bedpan, or urinal?: Total Help from another person bathing  (including washing, rinsing, drying)?: A Lot Help from another person to put on and taking off regular upper body clothing?: A Lot Help from another person to put on and taking off regular lower body clothing?: Total 6 Click Score: 10    End of Session    OT Visit Diagnosis: Unsteadiness on feet (R26.81);Other abnormalities of gait and mobility (R26.89);Muscle weakness (generalized) (M62.81)   Activity Tolerance Patient tolerated treatment well   Patient Left in bed;with call bell/phone within reach;with bed alarm set   Nurse Communication Other (comment) (rn present at beg. of session, aware i would be working with pt., CNA assisting with bed mobility portion)        Time: [id number] OT Time Calculation (min): 15 min  Charges: OT General Charges $OT Visit: 1 Visit OT Treatments $Therapeutic Exercise: 8-22 mins  Boneta Lucks, COTA/L Acute Rehabilitation [phone]   Alessandra Bevels Lorraine-COTA/L 09/21/2022, 11:18 AM

## 2022-09-21 NOTE — Progress Notes (Signed)
PROGRESS NOTE  Jacob Bass  DOB: 1960-09-22  PCP: Donia Ast., MD TFT:[id number]  DOA: 09/10/2022  LOS: 10 days  Hospital Day: 12  Brief narrative: Jacob Bass is a 62 y.o. male with PMH significant for history of stroke with residual left-sided deficits, HTN, liver cirrhosis, chronic anemia, COPD, recent diagnosis of COVID who lives at [facility]. 1/8, patient was sent to the ED for altered mental status and weakness.  Last known normal the previous night.  In the ED, patient was afebrile, hemodynamically stable Labs with creatinine elevated to 2.09, ammonia level elevated to 99 CT head unremarkable MRI brain positive for small acute infarct in the left coronary data CTA of head and neck showed focal stenosis at the right V1 and evidence suspicious for pseudoaneurysm Also showed moderate focal stenosis of the right P1 left P2.  Admitted to [facility] Neurology consulted for acute stroke  Subjective: Patient was seen and examined this morning.   Grunting.  Denies any pain. Seems to have significant accumulation of ascites fluid again. Called and updated [facility] for guardianship [name], [phone]).  I asked for advanced directives.  She is in reach out to her supervisor and get back to me  Assessment and plan: Acute CVA prior CVA with left-sided deficits Brought to the ED after patient was noted to be less interactive compared to her baseline.   MRI showed left acute coronary due to small infarct  Stroke workup completed.  Neurology consult appreciated Echo showed EF of 55 to 60% with no cardiac source of embolism A1c 4.5, LDL 74 PTA not on any antithrombotic or anticoagulant. Patient has been started on aspirin 81 mg daily.  No DAPT given thrombocytopenia. Statin not started because of underlying liver cirrhosis  Acute medical encephalopathy Hepatic encephalopathy Ammonia level was elevated to 99.  His altered mentation was probably partly due to  hyperammonemia Currently getting lactic through tube feeding.  Ammonia level improving, trend as below. Mental status still is compromised.  Slow to respond.  Nods head to answer simple yes/no questions.  Able to follow some motor commands.  Not oriented to place, person or time  Liver cirrhosis with anasarca Hypoalbuminemia Essential hypertension Per history, patient has worsening edema since October PTA on metoprolol 25 mg twice daily, Lasix 40 mg daily, Amlodipine 5 mg daily, lisinopril 5 mg daily Currently all medicines are on hold He has significant anasarca. Unable to consent but because of medical city, paracentesis has been done twice 1/14, 4 L removed.  1/17, almost 6 L removed.  He already has reaccumulation of ascites fluid today. Continue Protonix Recent Labs  Lab 09/15/22 0539 09/16/22 0619 09/17/22 0515 09/18/22 0516 09/19/22 0639 09/20/22 0405 09/21/22 0604  AST 79* 74*  --   --   --  70*  --   ALT 49* 46*  --   --   --  39  --   ALKPHOS 62 85  --   --   --  71  --   BILITOT 1.7* 1.2  --   --   --  1.0  --   PROT 6.4* 6.4*  --   --   --  6.9  --   ALBUMIN 1.8* 1.9*  --   --   --  1.9*  --   AMMONIA  --  46*  --   --  29  --   --   PLT 74* 76* 73* 69* 85* 77* 92*   Acute  kidney injury Baseline creatinine around 1.  Presented with creatinine of 2.08 Creatinine fluctuating.  Peaked at 2.12.  Creatinine down to 2 today but urine output not improving. Recent Labs    09/10/22 1000 09/11/22 1708 09/13/22 0411 09/15/22 0539 09/16/22 2951 09/17/22 0515 09/18/22 0516 09/19/22 0639 09/20/22 0405 09/21/22 0604  BUN 23 22 15 11 14 19  28* 39* 50* 58*  CREATININE 2.09* 1.77* 1.47* 1.31* 1.39* 1.31* 1.49* 1.91* 2.12* 2.00*   Chronic macrocytic anemia Secondary to liver disease.  Hemoglobin stable between 8 and 9. Recent Labs    09/10/22 1115 09/10/22 1611 09/11/22 1708 09/17/22 0515 09/18/22 0516 09/19/22 0639 09/20/22 0405 09/21/22 0604  HGB  --   --    <  > 8.5* 8.3* 7.6* 8.3* 8.8*  MCV  --   --    < > 101.6* 102.0* 102.2* 102.0* 104.2*  VITAMINB12  --  2,050*  --   --   --   --   --   --   FOLATE >40.0  --   --   --   --   --   --   --    < > = values in this interval not displayed.   COPD Continue bronchodilators  Dysphagia Core track feeding to continue.  Poor prognosis and frequent reaccumulation of ascites to consider PEG tube placement Speech therapy following   Hypoglycemia Blood glucose value was low when patient was altered and unable to take oral intake.  Currently on core track feeding.  BPH Flomax   Mobility: Will obtain PT evaluation after mental status improvement  Goals of care   Code Status: Full Code    Scheduled Meds:  aspirin  325 mg Per Tube Daily   feeding supplement (PROSource TF20)  60 mL Per Tube Daily   free water  200 mL Per Tube Q4H   heparin injection (subcutaneous)  5,000 Units Subcutaneous Q8H   lidocaine (PF)  15 mL Intradermal Once   mometasone-formoterol  2 puff Inhalation BID   pantoprazole (PROTONIX) IV  40 mg Intravenous Q24H   tamsulosin  0.4 mg Oral Daily   thiamine  100 mg Per Tube Daily   umeclidinium bromide  1 puff Inhalation Daily    PRN meds: acetaminophen **OR** acetaminophen (TYLENOL) oral liquid 160 mg/5 mL **OR** acetaminophen, ipratropium-albuterol, senna-docusate   Infusions:   feeding supplement (JEVITY 1.5 CAL/FIBER) 1,000 mL (09/21/22 1120)    Skin assessment:     Nutritional status:  Body mass index is 26.28 kg/m.  Nutrition Problem: Moderate Malnutrition Etiology: chronic illness Signs/Symptoms: moderate fat depletion, severe muscle depletion     Diet:  Diet Order             Diet NPO time specified  Diet effective now                   DVT prophylaxis:  heparin injection 5,000 Units Start: 09/11/22 2330 SCD's Start: 09/10/22 1605   Antimicrobials: None currently Fluid: None currently Consultants: Palliative care, IR Family  Communication: None at bedside. Spoke with DSS today. See above.  Status is: Inpatient  Continue in-hospital care because:  On core track feeding..  Ascites is already accumulating.  May need paracentesis again. Level of care: Telemetry Medical   Dispo: The patient is from: [facility] long-term care              Anticipated d/c is to: Poor prognosis.  Patient currently is not medically stable to d/c.   Difficult to place patient No    Antimicrobials: Anti-infectives (From admission, onward)    Start     Dose/Rate Route Frequency Ordered Stop   09/10/22 1345  cefTRIAXone (ROCEPHIN) 1 g in sodium chloride 0.9 % 100 mL IVPB        1 g 200 mL/hr over 30 Minutes Intravenous  Once 09/10/22 1331 09/10/22 1605   09/10/22 1345  azithromycin (ZITHROMAX) 500 mg in sodium chloride 0.9 % 250 mL IVPB        500 mg 250 mL/hr over 60 Minutes Intravenous  Once 09/10/22 1331 09/10/22 1605       Objective: Vitals:   09/21/22 0901 09/21/22 1255  BP:  133/85  Pulse:  94  Resp:  18  Temp:  98.2 F (36.8 C)  SpO2: 100%     Intake/Output Summary (Last 24 hours) at 09/21/2022 1430 Last data filed at 09/21/2022 8657 Gross per 24 hour  Intake 30 ml  Output --  Net 30 ml   Filed Weights   09/18/22 0500 09/20/22 0500 09/21/22 0500  Weight: 80.5 kg 77.4 kg 76.1 kg   Weight change: -1.3 kg Body mass index is 26.28 kg/m.   Physical Exam: General exam: Middle-aged African-American male.  Not in pain Skin: No rashes, lesions or ulcers. HEENT: Atraumatic, normocephalic, no obvious bleeding.  Core track feeding ongoing Lungs: Diminished air entry in both bases.  Moaning but not requiring supplemental oxygen CVS: Regular rate and rhythm, no murmur GI/Abd: worsening abdominal distention and firmness.  Nontender on touch. CNS: Alert, awake, not oriented.  Able to answer simple yes/no questions by nodding head. Psychiatry: Sad affect Extremities: 2+ bilateral pedal edema, no calf  tenderness  Data Review: I have personally reviewed the laboratory data and studies available.  F/u labs ordered Unresulted Labs (From admission, onward)    None       Total time spent in review of labs and imaging, patient evaluation, formulation of plan, documentation and communication with family: 45 minutes  Signed, Lorin Glass, MD [facility] 09/21/2022 Spoke with DSS today.  See above.  Accumulating

## 2022-09-22 DIAGNOSIS — I639 Cerebral infarction, unspecified: Secondary | ICD-10-CM | POA: Diagnosis not present

## 2022-09-22 LAB — COMPREHENSIVE METABOLIC PANEL
ALT: 52 U/L — ABNORMAL HIGH (ref 0–44)
AST: 81 U/L — ABNORMAL HIGH (ref 15–41)
Albumin: 1.8 g/dL — ABNORMAL LOW (ref 3.5–5.0)
Alkaline Phosphatase: 118 U/L (ref 38–126)
Anion gap: 7 (ref 5–15)
BUN: 61 mg/dL — ABNORMAL HIGH (ref 8–23)
CO2: 23 mmol/L (ref 22–32)
Calcium: 7.7 mg/dL — ABNORMAL LOW (ref 8.9–10.3)
Chloride: 111 mmol/L (ref 98–111)
Creatinine, Ser: 1.87 mg/dL — ABNORMAL HIGH (ref 0.61–1.24)
GFR, Estimated: 40 mL/min — ABNORMAL LOW (ref >60)
Glucose, Bld: 109 mg/dL — ABNORMAL HIGH (ref 70–99)
Potassium: 4.1 mmol/L (ref 3.5–5.1)
Sodium: 141 mmol/L (ref 135–145)
Total Bilirubin: 1 mg/dL (ref 0.3–1.2)
Total Protein: 6.6 g/dL (ref 6.5–8.1)

## 2022-09-22 LAB — CBC
HCT: 25.1 % — ABNORMAL LOW (ref 39.0–52.0)
Hemoglobin: 8.4 g/dL — ABNORMAL LOW (ref 13.0–17.0)
MCH: 34.1 pg — ABNORMAL HIGH (ref 26.0–34.0)
MCHC: 33.5 g/dL (ref 30.0–36.0)
MCV: 102 fL — ABNORMAL HIGH (ref 80.0–100.0)
Platelets: 96 10*3/uL — ABNORMAL LOW (ref 150–400)
RBC: 2.46 MIL/uL — ABNORMAL LOW (ref 4.22–5.81)
RDW: 20.1 % — ABNORMAL HIGH (ref 11.5–15.5)
WBC: 7 10*3/uL (ref 4.0–10.5)
nRBC: 0 % (ref 0.0–0.2)

## 2022-09-22 LAB — GLUCOSE, CAPILLARY
Glucose-Capillary: 105 mg/dL — ABNORMAL HIGH (ref 70–99)
Glucose-Capillary: 101 mg/dL — ABNORMAL HIGH (ref 70–99)
Glucose-Capillary: 121 mg/dL — ABNORMAL HIGH (ref 70–99)
Glucose-Capillary: 104 mg/dL — ABNORMAL HIGH (ref 70–99)
Glucose-Capillary: 118 mg/dL — ABNORMAL HIGH (ref 70–99)

## 2022-09-22 MED ORDER — ALBUMIN HUMAN 25 % IV SOLN
12.5000 g | Freq: Once | INTRAVENOUS | Status: AC
  Administered 2022-09-22: 12.5 g via INTRAVENOUS
  Filled 2022-09-22: qty 50

## 2022-09-22 NOTE — Progress Notes (Signed)
PROGRESS NOTE    Jacob Bass  WGN:[id number] DOB: 1961-01-20 DOA: 09/10/2022 PCP: Donia Ast., MD   Brief Narrative:  Jacob Bass is a 62 y.o. male with PMH significant for history of stroke with residual left-sided deficits, HTN, liver cirrhosis, chronic anemia, COPD, recent diagnosis of COVID who lives at [facility]. 1/8, patient was sent to the ED for altered mental status and weakness.  Last known normal the previous night.   In the ED, patient was afebrile, hemodynamically stable Labs with creatinine elevated to 2.09, ammonia level elevated to 99 CT head unremarkable MRI brain positive for small acute infarct in the left coronary data CTA of head and neck showed focal stenosis at the right V1 and evidence suspicious for pseudoaneurysm Also showed moderate focal stenosis of the right P1 left P2.  Admitted to [facility] Neurology consulted for acute stroke  Assessment & Plan:   Acute CVA Hx of prior CVA with left-sided deficits -MRI showed left acute coronary due to small infarct  -Stroke workup completed.  Neurology consult appreciated -Echo showed EF of 55 to 60% with no cardiac source of embolism -A1c 4.5, LDL 74 -Patient has been started on aspirin 81 mg daily.  No DAPT given thrombocytopenia. -Statin not started because of underlying liver cirrhosis   Acute medical encephalopathy Hepatic encephalopathy -Ammonia level was elevated to 99.  His altered mentation was probably partly due to hyperammonemia -Currently getting lactulose through tube feeding.  Ammonia level improving -PT recommended SNF-TOC aware   Liver cirrhosis with anasarca Hypoalbuminemia Essential hypertension -Per history, patient has worsening edema since October -on metoprolol 25 mg twice daily, Lasix 40 mg daily, Amlodipine 5 mg daily, lisinopril 5 mg daily Currently all medicines are on hold -paracentesis has been done twice 1/14, 4 L removed.  1/17, almost 6 L removed.  -He has significant  anasarca. -Consult IR for ultrasound-guided paracentesis today.  Acute kidney injury -Creatinine fluctuating.  Continue to monitor  Chronic macrocytic anemia -Secondary to liver disease.  Hemoglobin stable between 8 and 9.  COPD -Continue bronchodilators   Dysphagia -Core track feeding to continue.   -Patient is not a good candidate for G-tube placement due to recurrent ascites and anasarca   BPH -Flomax  DVT prophylaxis: Heparin Code Status: Full code Family Communication:  None present at bedside.  Plan of care discussed with patient in length and he verbalized understanding and agreed with it. Disposition Plan: SNF Consultants:  Neurology  Procedures:  paracentesis  Antimicrobials:  None  Status is: Inpatient      Subjective: Patient seen and examined.  RN at the bedside.  Anasarca noted.  No acute events overnight.  Objective: Vitals:   09/21/22 2353 09/22/22 0423 09/22/22 0742 09/22/22 1158  BP: (!) 141/86 (!) 153/74 (!) 143/73 (!) 147/72  Pulse: 95 94 94 96  Resp: 16 20 20 18   Temp: 98.7 F (37.1 C) 98.3 F (36.8 C) 98.2 F (36.8 C) 98.2 F (36.8 C)  TempSrc: Axillary Axillary Axillary Oral  SpO2: 100% 94% 96% 98%  Weight:      Height:        Intake/Output Summary (Last 24 hours) at 09/22/2022 1306 Last data filed at 09/22/2022 0424 Gross per 24 hour  Intake --  Output 300 ml  Net -300 ml   Filed Weights   09/18/22 0500 09/20/22 0500 09/21/22 0500  Weight: 80.5 kg 77.4 kg 76.1 kg    Examination:  General exam: Appears calm and comfortable, has core track, on  room air Respiratory system: Clear to auscultation. Respiratory effort normal. Cardiovascular system: S1 & S2 heard, RRR. No JVD, murmurs, rubs, gallops or clicks.  Bilateral 3+ pitting edema positive Gastrointestinal system: Abdomen is severely distended, firm, nontender on exam. Central nervous system: Alert, awake, following commands Psychiatry: Flat fact   Data Reviewed: I  have personally reviewed following labs and imaging studies  CBC: Recent Labs  Lab 09/17/22 0515 09/18/22 0516 09/19/22 0639 09/20/22 0405 09/21/22 0604 09/22/22 1100  WBC 6.6 7.0 6.6 9.0 9.7 7.0  NEUTROABS 4.0 3.9 5.2 7.4 6.9  --   HGB 8.5* 8.3* 7.6* 8.3* 8.8* 8.4*  HCT 25.9* 25.6* 23.4* 25.3* 27.4* 25.1*  MCV 101.6* 102.0* 102.2* 102.0* 104.2* 102.0*  PLT 73* 69* 85* 77* 92* 96*   Basic Metabolic Panel: Recent Labs  Lab 09/15/22 1757 09/16/22 0619 09/17/22 0515 09/18/22 0516 09/19/22 0639 09/20/22 0405 09/21/22 0604 09/22/22 1100  NA  --  147*   < > 142 141 139 142 141  K  --  3.5   < > 3.9 4.5 4.6 4.0 4.1  CL  --  118*   < > 114* 112* 111 110 111  CO2  --  23   < > 24 23 22 24 23   GLUCOSE  --  96   < > 105* 130* 115* 110* 109*  BUN  --  14   < > 28* 39* 50* 58* 61*  CREATININE  --  1.39*   < > 1.49* 1.91* 2.12* 2.00* 1.87*  CALCIUM  --  7.6*   < > 7.5* 7.6* 8.0* 8.0* 7.7*  MG 2.0 2.0  --   --   --   --   --   --   PHOS 2.8 2.6  --   --   --   --   --   --    < > = values in this interval not displayed.   GFR: Estimated Creatinine Clearance: 37.8 mL/min (A) (by C-G formula based on SCr of 1.87 mg/dL (H)). Liver Function Tests: Recent Labs  Lab 09/16/22 0619 09/20/22 0405 09/22/22 1100  AST 74* 70* 81*  ALT 46* 39 52*  ALKPHOS 85 71 118  BILITOT 1.2 1.0 1.0  PROT 6.4* 6.9 6.6  ALBUMIN 1.9* 1.9* 1.8*   No results for input(s): "LIPASE", "AMYLASE" in the last 168 hours. Recent Labs  Lab 09/16/22 0619 09/19/22 0639  AMMONIA 46* 29   Coagulation Profile: No results for input(s): "INR", "PROTIME" in the last 168 hours. Cardiac Enzymes: No results for input(s): "CKTOTAL", "CKMB", "CKMBINDEX", "TROPONINI" in the last 168 hours. BNP (last 3 results) No results for input(s): "PROBNP" in the last 8760 hours. HbA1C: No results for input(s): "HGBA1C" in the last 72 hours. CBG: Recent Labs  Lab 09/21/22 2002 09/21/22 2357 09/22/22 0430 09/22/22 0745  09/22/22 1155  GLUCAP 94 116* 105* 101* 121*   Lipid Profile: No results for input(s): "CHOL", "HDL", "LDLCALC", "TRIG", "CHOLHDL", "LDLDIRECT" in the last 72 hours. Thyroid Function Tests: No results for input(s): "TSH", "T4TOTAL", "FREET4", "T3FREE", "THYROIDAB" in the last 72 hours. Anemia Panel: No results for input(s): "VITAMINB12", "FOLATE", "FERRITIN", "TIBC", "IRON", "RETICCTPCT" in the last 72 hours. Sepsis Labs: No results for input(s): "PROCALCITON", "LATICACIDVEN" in the last 168 hours.  No results found for this or any previous visit (from the past 240 hour(s)).    Radiology Studies: No results found.  Scheduled Meds:  aspirin  325 mg Per Tube Daily   feeding  supplement (PROSource TF20)  60 mL Per Tube Daily   free water  200 mL Per Tube Q4H   heparin injection (subcutaneous)  5,000 Units Subcutaneous Q8H   lidocaine (PF)  15 mL Intradermal Once   mometasone-formoterol  2 puff Inhalation BID   pantoprazole (PROTONIX) IV  40 mg Intravenous Q24H   tamsulosin  0.4 mg Oral Daily   thiamine  100 mg Per Tube Daily   umeclidinium bromide  1 puff Inhalation Daily   Continuous Infusions:  albumin human     feeding supplement (JEVITY 1.5 CAL/FIBER) 1,000 mL (09/22/22 0544)     LOS: 11 days   Time spent: 35 minutes   Karem Farha Estill Cotta, MD [facility]  If 7PM-7AM, please contact night-coverage [email or web address] 09/22/2022, 1:06 PM

## 2022-09-23 ENCOUNTER — Inpatient Hospital Stay (HOSPITAL_COMMUNITY): Payer: Medicaid Other

## 2022-09-23 DIAGNOSIS — I639 Cerebral infarction, unspecified: Secondary | ICD-10-CM | POA: Diagnosis not present

## 2022-09-23 LAB — GLUCOSE, CAPILLARY
Glucose-Capillary: 102 mg/dL — ABNORMAL HIGH (ref 70–99)
Glucose-Capillary: 104 mg/dL — ABNORMAL HIGH (ref 70–99)
Glucose-Capillary: 118 mg/dL — ABNORMAL HIGH (ref 70–99)
Glucose-Capillary: 81 mg/dL (ref 70–99)
Glucose-Capillary: 84 mg/dL (ref 70–99)
Glucose-Capillary: 88 mg/dL (ref 70–99)

## 2022-09-23 LAB — BASIC METABOLIC PANEL
Anion gap: 10 (ref 5–15)
BUN: 65 mg/dL — ABNORMAL HIGH (ref 8–23)
CO2: 23 mmol/L (ref 22–32)
Calcium: 8.1 mg/dL — ABNORMAL LOW (ref 8.9–10.3)
Chloride: 109 mmol/L (ref 98–111)
Creatinine, Ser: 1.86 mg/dL — ABNORMAL HIGH (ref 0.61–1.24)
GFR, Estimated: 40 mL/min — ABNORMAL LOW (ref >60)
Glucose, Bld: 91 mg/dL (ref 70–99)
Potassium: 3.7 mmol/L (ref 3.5–5.1)
Sodium: 142 mmol/L (ref 135–145)

## 2022-09-23 LAB — CBC
HCT: 25.7 % — ABNORMAL LOW (ref 39.0–52.0)
Hemoglobin: 8.2 g/dL — ABNORMAL LOW (ref 13.0–17.0)
MCH: 33.9 pg (ref 26.0–34.0)
MCHC: 31.9 g/dL (ref 30.0–36.0)
MCV: 106.2 fL — ABNORMAL HIGH (ref 80.0–100.0)
Platelets: 94 10*3/uL — ABNORMAL LOW (ref 150–400)
RBC: 2.42 MIL/uL — ABNORMAL LOW (ref 4.22–5.81)
RDW: 20.5 % — ABNORMAL HIGH (ref 11.5–15.5)
WBC: 6.4 10*3/uL (ref 4.0–10.5)
nRBC: 0 % (ref 0.0–0.2)

## 2022-09-23 MED ORDER — LIDOCAINE HCL (PF) 1 % IJ SOLN
INTRAMUSCULAR | Status: AC
  Filled 2022-09-23: qty 30

## 2022-09-23 MED ORDER — FUROSEMIDE 40 MG PO TABS
40.0000 mg | ORAL_TABLET | Freq: Every morning | ORAL | Status: DC
  Administered 2022-09-23 – 2022-09-24 (×2): 40 mg via ORAL
  Filled 2022-09-23 (×2): qty 1

## 2022-09-23 MED ORDER — METOPROLOL TARTRATE 25 MG PO TABS
25.0000 mg | ORAL_TABLET | Freq: Two times a day (BID) | ORAL | Status: DC
  Administered 2022-09-23 – 2022-09-24 (×3): 25 mg via ORAL
  Filled 2022-09-23 (×3): qty 1

## 2022-09-23 MED ORDER — ALBUMIN HUMAN 25 % IV SOLN
12.5000 g | Freq: Once | INTRAVENOUS | Status: AC
  Administered 2022-09-23: 12.5 g via INTRAVENOUS
  Filled 2022-09-23: qty 50

## 2022-09-23 NOTE — Progress Notes (Addendum)
Request received for therapeutic paracentesis. Pt is unable to consent himself and has no family, next of kin or POA to consent. DDS is currently working to obtain guardianship.   After discussion with Dr. Doristine Bosworth, she confirms that pt had medical necessity for urgent paracentesis and has documented such in the chart.   Dr. Dwaine Gale, IR, is in agreement to proceed with urgent paracentesis.   IR to proceed with paracentesis.     Narda Rutherford, AGNP-BC 09/23/2022, 10:46 AM

## 2022-09-23 NOTE — Procedures (Signed)
PROCEDURE SUMMARY:  Successful US guided therapeutic paracentesis from RUQ.  Yielded 3.5 L of clear, pale yellow fluid.  No immediate complications.  Pt tolerated well.   Specimen not sent for labs.  EBL < 1 mL  Tyson Alias, AGNP 09/23/2022 10:46 AM

## 2022-09-23 NOTE — Progress Notes (Signed)
PROGRESS NOTE    Joseandres Alfonse Ras  ZDG:[id number] DOB: 1961-04-17 DOA: 09/10/2022 PCP: Donia Ast., MD   Brief Narrative:  Jacob Bass is a 62 y.o. male with PMH significant for history of stroke with residual left-sided deficits, HTN, liver cirrhosis, chronic anemia, COPD, recent diagnosis of COVID who lives at [facility]. 1/8, patient was sent to the ED for altered mental status and weakness.  Last known normal the previous night.   In the ED, patient was afebrile, hemodynamically stable Labs with creatinine elevated to 2.09, ammonia level elevated to 99. CT head unremarkable. MRI brain positive for small acute infarct in the left coronary data. CTA of head and neck showed focal stenosis at the right V1 and evidence suspicious for pseudoaneurysm. Also showed moderate focal stenosis of the right P1 left P2. Admitted to [facility]. Neurology consulted for acute stroke.  Patient has recurrent ascites and dysphagia, received paracentesis x3, dysphagia not improving and is not candidate for PEG tube placement due to recurrent ascites.  Currently has core track.  Palliative care consulted.  Legal guardianship is in process.  Social worker is working.  Patient is currently full code.  Assessment & Plan:   Acute CVA Hx of prior CVA with left-sided deficits -MRI showed left acute coronary due to small infarct  -Stroke workup completed.  Neurology consult appreciated -Echo showed EF of 55 to 60% with no cardiac source of embolism -A1c 4.5, LDL 74 -Patient has been started on aspirin 81 mg daily.  No DAPT given thrombocytopenia. -Statin not started because of underlying liver cirrhosis -Neurology signed off   Acute medical encephalopathy Hepatic encephalopathy -Ammonia level was elevated to 99.  His altered mentation was probably partly due to hyperammonemia -Currently getting lactulose through tube feeding.  Ammonia level improving -PT recommended SNF-TOC aware   Liver cirrhosis with  anasarca Hypoalbuminemia -Per history, patient has worsening edema since October -paracentesis has been done twice 1/14, 4 L removed.  1/17, almost 6 L removed.  -He has significant anasarca with severe abdominal distention and discomfort.  He needs urgent paracentesis -Consult IR for ultrasound-guided paracentesis.  3.5 L removed on 1/21.  Appreciated help.  Albumin given  Hypertension: -on metoprolol 25 mg twice daily, Lasix 40 mg daily, Amlodipine 5 mg daily, lisinopril 5 mg daily-all medication held on admission. -Blood pressure is fluctuating.  I will resume Lasix and metoprolol today.  Will continue to monitor blood pressure closely  Acute kidney injury -Creatinine fluctuating.  Continue to monitor  Chronic macrocytic anemia -Secondary to liver disease.  Hemoglobin stable between 8 and 9.  COPD -Continue bronchodilators   Dysphagia -Core track feeding to continue.   -Patient is not a good candidate for G-tube placement due to recurrent ascites and anasarca   Thrombocytopenia likely due to liver cirrhosis. -Will continue to monitor.  BPH -Flomax  DVT prophylaxis: Heparin Code Status: Full code Family Communication:  None present at bedside.  Plan of care discussed with patient in length and he verbalized understanding and agreed with it. Disposition Plan: SNF Consultants:  Neurology  Procedures:  paracentesis  Antimicrobials:  None  Status is: Inpatient      Subjective: Patient seen and examined.  RN at the bedside.  Anasarca noted.  No acute events overnight.  Objective: Vitals:   09/23/22 0822 09/23/22 0855 09/23/22 0856 09/23/22 0858  BP: (!) 159/84     Pulse: 96     Resp: 20     Temp: 98.3 F (36.8 C)  TempSrc:      SpO2: 93% 100% 100% 100%  Weight:      Height:        Intake/Output Summary (Last 24 hours) at 09/23/2022 1051 Last data filed at 09/23/2022 0442 Gross per 24 hour  Intake 1050 ml  Output 600 ml  Net 450 ml    Filed  Weights   09/20/22 0500 09/21/22 0500 09/23/22 0016  Weight: 77.4 kg 76.1 kg 79.2 kg    Examination:  General exam: Appears calm and comfortable, has core track, on room air, appears weak and sick, older than stated age Respiratory system: Clear to auscultation. Respiratory effort normal. Cardiovascular system: S1 & S2 heard, RRR. No JVD, murmurs, rubs, gallops or clicks.  Bilateral 3+ pitting edema positive Gastrointestinal system: Abdomen is severely distended, firm, nontender on exam.  Bowel sounds positive Central nervous system: Alert, awake, following commands Psychiatry: Flat fact   Data Reviewed: I have personally reviewed following labs and imaging studies  CBC: Recent Labs  Lab 09/17/22 0515 09/18/22 0516 09/19/22 0639 09/20/22 0405 09/21/22 0604 09/22/22 1100 09/23/22 0335  WBC 6.6 7.0 6.6 9.0 9.7 7.0 6.4  NEUTROABS 4.0 3.9 5.2 7.4 6.9  --   --   HGB 8.5* 8.3* 7.6* 8.3* 8.8* 8.4* 8.2*  HCT 25.9* 25.6* 23.4* 25.3* 27.4* 25.1* 25.7*  MCV 101.6* 102.0* 102.2* 102.0* 104.2* 102.0* 106.2*  PLT 73* 69* 85* 77* 92* 96* 94*    Basic Metabolic Panel: Recent Labs  Lab 09/19/22 0639 09/20/22 0405 09/21/22 0604 09/22/22 1100 09/23/22 0335  NA 141 139 142 141 142  K 4.5 4.6 4.0 4.1 3.7  CL 112* 111 110 111 109  CO2 23 22 24 23 23   GLUCOSE 130* 115* 110* 109* 91  BUN 39* 50* 58* 61* 65*  CREATININE 1.91* 2.12* 2.00* 1.87* 1.86*  CALCIUM 7.6* 8.0* 8.0* 7.7* 8.1*    GFR: Estimated Creatinine Clearance: 38 mL/min (A) (by C-G formula based on SCr of 1.86 mg/dL (H)). Liver Function Tests: Recent Labs  Lab 09/20/22 0405 09/22/22 1100  AST 70* 81*  ALT 39 52*  ALKPHOS 71 118  BILITOT 1.0 1.0  PROT 6.9 6.6  ALBUMIN 1.9* 1.8*    No results for input(s): "LIPASE", "AMYLASE" in the last 168 hours. Recent Labs  Lab 09/19/22 0639  AMMONIA 29    Coagulation Profile: No results for input(s): "INR", "PROTIME" in the last 168 hours. Cardiac Enzymes: No  results for input(s): "CKTOTAL", "CKMB", "CKMBINDEX", "TROPONINI" in the last 168 hours. BNP (last 3 results) No results for input(s): "PROBNP" in the last 8760 hours. HbA1C: No results for input(s): "HGBA1C" in the last 72 hours. CBG: Recent Labs  Lab 09/22/22 1606 09/22/22 2015 09/23/22 0042 09/23/22 0440 09/23/22 0819  GLUCAP 104* 118* 102* 118* 104*    Lipid Profile: No results for input(s): "CHOL", "HDL", "LDLCALC", "TRIG", "CHOLHDL", "LDLDIRECT" in the last 72 hours. Thyroid Function Tests: No results for input(s): "TSH", "T4TOTAL", "FREET4", "T3FREE", "THYROIDAB" in the last 72 hours. Anemia Panel: No results for input(s): "VITAMINB12", "FOLATE", "FERRITIN", "TIBC", "IRON", "RETICCTPCT" in the last 72 hours. Sepsis Labs: No results for input(s): "PROCALCITON", "LATICACIDVEN" in the last 168 hours.  No results found for this or any previous visit (from the past 240 hour(s)).    Radiology Studies: No results found.  Scheduled Meds:  lidocaine (PF)       aspirin  325 mg Per Tube Daily   feeding supplement (PROSource TF20)  60 mL Per Tube Daily  free water  200 mL Per Tube Q4H   heparin injection (subcutaneous)  5,000 Units Subcutaneous Q8H   lidocaine (PF)  15 mL Intradermal Once   mometasone-formoterol  2 puff Inhalation BID   pantoprazole (PROTONIX) IV  40 mg Intravenous Q24H   tamsulosin  0.4 mg Oral Daily   thiamine  100 mg Per Tube Daily   umeclidinium bromide  1 puff Inhalation Daily   Continuous Infusions:  albumin human     feeding supplement (JEVITY 1.5 CAL/FIBER) 1,000 mL (09/22/22 2349)     LOS: 12 days   Time spent: 35 minutes   Rachell Druckenmiller Estill Cotta, MD [facility]  If 7PM-7AM, please contact night-coverage [email or web address] 09/23/2022, 10:51 AM

## 2022-09-24 DIAGNOSIS — I639 Cerebral infarction, unspecified: Secondary | ICD-10-CM | POA: Diagnosis not present

## 2022-09-24 LAB — CBC
HCT: 27.9 % — ABNORMAL LOW (ref 39.0–52.0)
Hemoglobin: 8.7 g/dL — ABNORMAL LOW (ref 13.0–17.0)
MCH: 34 pg (ref 26.0–34.0)
MCHC: 31.2 g/dL (ref 30.0–36.0)
MCV: 109 fL — ABNORMAL HIGH (ref 80.0–100.0)
Platelets: 67 10*3/uL — ABNORMAL LOW (ref 150–400)
RBC: 2.56 MIL/uL — ABNORMAL LOW (ref 4.22–5.81)
RDW: 21.2 % — ABNORMAL HIGH (ref 11.5–15.5)
WBC: 6.8 10*3/uL (ref 4.0–10.5)
nRBC: 0 % (ref 0.0–0.2)

## 2022-09-24 LAB — COMPREHENSIVE METABOLIC PANEL
ALT: 51 U/L — ABNORMAL HIGH (ref 0–44)
AST: 80 U/L — ABNORMAL HIGH (ref 15–41)
Albumin: 2.2 g/dL — ABNORMAL LOW (ref 3.5–5.0)
Alkaline Phosphatase: 102 U/L (ref 38–126)
Anion gap: 10 (ref 5–15)
BUN: 66 mg/dL — ABNORMAL HIGH (ref 8–23)
CO2: 23 mmol/L (ref 22–32)
Calcium: 8 mg/dL — ABNORMAL LOW (ref 8.9–10.3)
Chloride: 110 mmol/L (ref 98–111)
Creatinine, Ser: 1.66 mg/dL — ABNORMAL HIGH (ref 0.61–1.24)
GFR, Estimated: 46 mL/min — ABNORMAL LOW (ref >60)
Glucose, Bld: 84 mg/dL (ref 70–99)
Potassium: 4.3 mmol/L (ref 3.5–5.1)
Sodium: 143 mmol/L (ref 135–145)
Total Bilirubin: 1.2 mg/dL (ref 0.3–1.2)
Total Protein: 6.6 g/dL (ref 6.5–8.1)

## 2022-09-24 LAB — GLUCOSE, CAPILLARY
Glucose-Capillary: 100 mg/dL — ABNORMAL HIGH (ref 70–99)
Glucose-Capillary: 104 mg/dL — ABNORMAL HIGH (ref 70–99)
Glucose-Capillary: 116 mg/dL — ABNORMAL HIGH (ref 70–99)
Glucose-Capillary: 88 mg/dL (ref 70–99)
Glucose-Capillary: 89 mg/dL (ref 70–99)
Glucose-Capillary: 94 mg/dL (ref 70–99)
Glucose-Capillary: 98 mg/dL (ref 70–99)

## 2022-09-24 MED ORDER — FUROSEMIDE 40 MG PO TABS: 40.0000 mg | ORAL_TABLET | Freq: Every day | ORAL | Status: DC

## 2022-09-24 MED ORDER — SENNOSIDES-DOCUSATE SODIUM 8.6-50 MG PO TABS: 1.0000 | ORAL_TABLET | Freq: Every evening | ORAL | Status: DC | PRN

## 2022-09-24 MED ORDER — METOPROLOL TARTRATE 25 MG PO TABS
25.0000 mg | ORAL_TABLET | Freq: Two times a day (BID) | ORAL | Status: DC
  Administered 2022-09-24 – 2022-10-17 (×43): 25 mg
  Filled 2022-09-24 (×44): qty 1

## 2022-09-24 MED ORDER — FUROSEMIDE 10 MG/ML IJ SOLN
40.0000 mg | Freq: Every day | INTRAMUSCULAR | Status: DC
  Administered 2022-09-24 – 2022-10-03 (×10): 40 mg via INTRAVENOUS
  Filled 2022-09-24 (×11): qty 4

## 2022-09-24 NOTE — TOC Progression Note (Signed)
Transition of Care [facility]) - Progression Note    Patient Details  Name: Jacob Bass MRN: [id number] Date of Birth: 1960/12/08  Transition of Care [facility]) CM/SW [place], [place] Phone Number: 09/24/2022, 2:29 PM  Clinical Narrative:   CSW received call from [place] that they are reviewing patient's case either today or tomorrow, asking for an update on how he's doing. CSW provided clinical update. DSS will call back after review with an update on their end.         Expected Discharge Plan and Services                                               Social Determinants of Health (SDOH) Interventions    Readmission Risk Interventions     No data to display

## 2022-09-24 NOTE — Progress Notes (Signed)
PT Cancellation Note  Patient Details Name: Jacob Bass MRN: [id number] DOB: [birth year]   Cancelled Treatment:    Reason Eval/Treat Not Completed: (P) Fatigue/lethargy limiting ability to participate, pt opening eyes to voice, however falling back asleep quickly. Will check back as schedule allows to continue with PT POC.  Audry Riles. PTA Acute Rehabilitation Services Office: [place] 09/24/2022, 4:01 PM

## 2022-09-24 NOTE — Progress Notes (Signed)
PROGRESS NOTE  Jacob Bass  DOB: 28-May-1961  PCP: Donia Ast., MD UJW:[id number]  DOA: 09/10/2022  LOS: 13 days  Hospital Day: 15  Brief narrative: Jacob Bass is a 62 y.o. male with PMH significant for history of stroke with residual left-sided deficits, HTN, liver cirrhosis, chronic anemia, COPD, recent diagnosis of COVID who lives at [facility]. 1/8, patient was sent to the ED for altered mental status and weakness.  Last known normal the previous night.  In the ED, patient was afebrile, hemodynamically stable Labs with creatinine elevated to 2.09, ammonia level elevated to 99 CT head unremarkable MRI brain positive for small acute infarct in the left coronary data CTA of head and neck showed focal stenosis at the right V1 and evidence suspicious for pseudoaneurysm Also showed moderate focal stenosis of the right P1 left P2.  Admitted to [facility] Neurology consulted for acute stroke  His hospital course has been complicated by recurrent ascites and dysphagia.  He has required paracentesis x 3.  Dysphagia is not improving and he is not a candidate for PEG tube placement due to recurrent ascites.  Currently has core track feeding. Palliative care consulted.  Legal guardianship is in process. Patient remains full code  Subjective: Patient was seen and examined this morning.   Grunting.  Mumbling.  Not in any visible pain.  Unable to verbalize.  However able to follow motor commands. Core track feeding ongoing. He seems to have had quick reaccumulation of ascites again.  Assessment and plan: Acute CVA prior CVA with left-sided deficits Brought to the ED after patient was noted to be less interactive compared to her baseline.   MRI showed left acute coronary due to small infarct  Stroke workup completed.  Neurology consult appreciated Echo showed EF of 55 to 60% with no cardiac source of embolism A1c 4.5, LDL 74 PTA not on any antithrombotic or anticoagulant. Patient has been  started on aspirin 81 mg daily.  No DAPT given thrombocytopenia. Statin not started because of underlying liver cirrhosis Current deficit include expressive aphasia and dysphagia.  Acute medical encephalopathy Hepatic encephalopathy Ammonia level was elevated to 99.  His altered mentation was probably partly due to hyperammonemia Currently getting lactic through tube feeding.  Ammonia level improving, trend as below. Mental status improved but overall neurological status is compromised because of acute stroke.  Liver cirrhosis with anasarca Hypoalbuminemia Essential hypertension Per history, patient has worsening edema since October He has significant anasarca. Unable to consent but because of medical city, paracentesis has been done 3 times.  Last done on 1/21 when 3.5 L were removed. On exam today, he already has distended abdomen.  Probably reaccumulating again.  May need paracentesis in next 1 to 2 days again. PTA on metoprolol 25 mg twice daily, Lasix 40 mg daily, Amlodipine 5 mg daily, lisinopril 5 mg daily Blood pressure fluctuating.  Metoprolol and Lasix have been resumed.  Others remain on hold. Clinically he has bilateral pedal edema 1-2+ and fast recommending ascites.  I started him on IV Lasix 40 mg daily this morning. Continue Protonix Recent Labs  Lab 09/19/22 0639 09/20/22 0405 09/21/22 0604 09/22/22 1100 09/23/22 0335 09/24/22 0328  AST  --  70*  --  81*  --  80*  ALT  --  39  --  52*  --  51*  ALKPHOS  --  71  --  118  --  102  BILITOT  --  1.0  --  1.0  --  1.2  PROT  --  6.9  --  6.6  --  6.6  ALBUMIN  --  1.9*  --  1.8*  --  2.2*  AMMONIA 29  --   --   --   --   --   PLT 85* 77* 92* 96* 94* 67*   Acute kidney injury Baseline creatinine around 1.  Presented with creatinine of 2.08 Creatinine fluctuating.  Peaked at 2.12.  Gradually improving.  1.66 today. Recent Labs    09/15/22 0539 09/16/22 0619 09/17/22 0515 09/18/22 0516 09/19/22 0639  09/20/22 0405 09/21/22 0604 09/22/22 1100 09/23/22 0335 09/24/22 0328  BUN 11 14 19  28* 39* 50* 58* 61* 65* 66*  CREATININE 1.31* 1.39* 1.31* 1.49* 1.91* 2.12* 2.00* 1.87* 1.86* 1.66*   Chronic macrocytic anemia Secondary to liver disease.  Hemoglobin stable between 8 and 9. Recent Labs    09/10/22 1115 09/10/22 1611 09/11/22 1708 09/20/22 0405 09/21/22 0604 09/22/22 1100 09/23/22 0335 09/24/22 0328  HGB  --   --    < > 8.3* 8.8* 8.4* 8.2* 8.7*  MCV  --   --    < > 102.0* 104.2* 102.0* 106.2* 109.0*  VITAMINB12  --  2,050*  --   --   --   --   --   --   FOLATE >40.0  --   --   --   --   --   --   --    < > = values in this interval not displayed.   COPD Continue bronchodilators  Dysphagia Core track feeding to continue.  Patient is not a good candidate for PEG tube placement because of poor prognosis and frequent reaccumulation of ascites Speech therapy following   Hypoglycemia Blood glucose value was low when patient was altered and unable to take oral intake.  Currently on core track feeding.  BPH Flomax   Mobility: PT following.  SNF recommended  Goals of care   Code Status: Full Code    Scheduled Meds:  aspirin  325 mg Per Tube Daily   feeding supplement (PROSource TF20)  60 mL Per Tube Daily   free water  200 mL Per Tube Q4H   furosemide  40 mg Intravenous Daily   heparin injection (subcutaneous)  5,000 Units Subcutaneous Q8H   lidocaine (PF)  15 mL Intradermal Once   metoprolol tartrate  25 mg Per Tube BID   mometasone-formoterol  2 puff Inhalation BID   pantoprazole (PROTONIX) IV  40 mg Intravenous Q24H   tamsulosin  0.4 mg Oral Daily   umeclidinium bromide  1 puff Inhalation Daily    PRN meds: [DISCONTINUED] acetaminophen **OR** acetaminophen (TYLENOL) oral liquid 160 mg/5 mL **OR** acetaminophen, ipratropium-albuterol, senna-docusate   Infusions:   feeding supplement (JEVITY 1.5 CAL/FIBER) 1,000 mL (09/23/22 2211)    Skin assessment:      Nutritional status:  Body mass index is 27.35 kg/m.  Nutrition Problem: Moderate Malnutrition Etiology: chronic illness Signs/Symptoms: moderate fat depletion, severe muscle depletion     Diet:  Diet Order             Diet NPO time specified  Diet effective now                   DVT prophylaxis:  heparin injection 5,000 Units Start: 09/11/22 2330 SCD's Start: 09/10/22 1605   Antimicrobials: None currently Fluid: None currently Consultants: Palliative care, IR Family Communication: None at bedside.  1/19, called and updated [facility]  DSS-APS for guardianship [name], [phone]).  I asked for advanced directives.  She was going to reach out to her supervisor and get back to me.  I have not heard any thing from her since then.  Status is: Inpatient  Continue in-hospital care because:  On core track feeding..  Ascites is already accumulating.  May need paracentesis again. Level of care: Telemetry Medical   Dispo: The patient is from: [facility] long-term care              Anticipated d/c is to: Poor prognosis.              Patient currently is not medically stable to d/c.   Difficult to place patient No    Antimicrobials: Anti-infectives (From admission, onward)    Start     Dose/Rate Route Frequency Ordered Stop   09/10/22 1345  cefTRIAXone (ROCEPHIN) 1 g in sodium chloride 0.9 % 100 mL IVPB        1 g 200 mL/hr over 30 Minutes Intravenous  Once 09/10/22 1331 09/10/22 1605   09/10/22 1345  azithromycin (ZITHROMAX) 500 mg in sodium chloride 0.9 % 250 mL IVPB        500 mg 250 mL/hr over 60 Minutes Intravenous  Once 09/10/22 1331 09/10/22 1605       Objective: Vitals:   09/24/22 1058 09/24/22 1129  BP:  (!) 111/58  Pulse: 68 64  Resp: 19 17  Temp:  98 F (36.7 C)  SpO2: 100% 100%    Intake/Output Summary (Last 24 hours) at 09/24/2022 1333 Last data filed at 09/24/2022 0554 Gross per 24 hour  Intake 1600 ml  Output 850 ml  Net  750 ml   Filed Weights   09/20/22 0500 09/21/22 0500 09/23/22 0016  Weight: 77.4 kg 76.1 kg 79.2 kg   Weight change:  Body mass index is 27.35 kg/m.   Physical Exam: General exam: Middle-aged African-American male.  Not in pain Skin: No rashes, lesions or ulcers. HEENT: Atraumatic, normocephalic, no obvious bleeding.  Core track feeding ongoing Lungs: Diminished air entry in both bases.  Moaning but not requiring supplemental oxygen CVS: Regular rate and rhythm, no murmur GI/Abd: worsening abdominal distention and firmness.  Nontender on touch. CNS: Alert, awake, not oriented.  Has expressive aphasia.  Able to follow motor commands Psychiatry: Sad affect Extremities: 2+ bilateral pedal edema, no calf tenderness  Data Review: I have personally reviewed the laboratory data and studies available.  F/u labs ordered Unresulted Labs (From admission, onward)    None       Total time spent in review of labs and imaging, patient evaluation, formulation of plan, documentation and communication with family: 45 minutes  Signed, Lorin Glass, MD [facility] 09/24/2022 Spoke with DSS today.  See above.  Accumulating

## 2022-09-24 NOTE — Consult Note (Signed)
WOC Nurse Consult Note: Reason for Consult: Unstageable pressure injury to left medial heel Wound type: pressure injury Pressure Injury POA: NO Measurement: 1 cm x 1.5 cm intact dark Wound [id number] Drainage (amount, consistency, odor) none Periwound: dry skin, generalized edema, anasarca throughout.  Awaiting paracentesis again.  Visibly short of breath.  Dressing procedure/placement/frequency: foam dressing to left medial heel.  Moisturize legs with emollient lotion.  Apply modified light compression to bilateral leg edema with kerlix and ace bandage, wrapping from below toes to below knee.  Change twice weekly.  Prevalon to bilateral heels.   Will not follow at this time.  Please re-consult if needed.  Estrellita Ludwig MSN, RN, FNP-BC CWON Wound, Ostomy, Continence Nurse [facility] [phone] Pager [phone]

## 2022-09-25 ENCOUNTER — Inpatient Hospital Stay (HOSPITAL_COMMUNITY): Payer: Medicaid Other

## 2022-09-25 DIAGNOSIS — I639 Cerebral infarction, unspecified: Secondary | ICD-10-CM | POA: Diagnosis not present

## 2022-09-25 HISTORY — PX: IR PARACENTESIS: IMG2679

## 2022-09-25 LAB — BASIC METABOLIC PANEL
Anion gap: 9 (ref 5–15)
BUN: 72 mg/dL — ABNORMAL HIGH (ref 8–23)
CO2: 23 mmol/L (ref 22–32)
Calcium: 8 mg/dL — ABNORMAL LOW (ref 8.9–10.3)
Chloride: 110 mmol/L (ref 98–111)
Creatinine, Ser: 1.86 mg/dL — ABNORMAL HIGH (ref 0.61–1.24)
GFR, Estimated: 40 mL/min — ABNORMAL LOW (ref >60)
Glucose, Bld: 98 mg/dL (ref 70–99)
Potassium: 3.9 mmol/L (ref 3.5–5.1)
Sodium: 142 mmol/L (ref 135–145)

## 2022-09-25 LAB — GLUCOSE, CAPILLARY
Glucose-Capillary: 102 mg/dL — ABNORMAL HIGH (ref 70–99)
Glucose-Capillary: 105 mg/dL — ABNORMAL HIGH (ref 70–99)
Glucose-Capillary: 115 mg/dL — ABNORMAL HIGH (ref 70–99)
Glucose-Capillary: 73 mg/dL (ref 70–99)
Glucose-Capillary: 91 mg/dL (ref 70–99)
Glucose-Capillary: 92 mg/dL (ref 70–99)

## 2022-09-25 NOTE — Progress Notes (Signed)
Physical Therapy Treatment Patient Details Name: Jacob Bass MRN: [id number] DOB: 1961-05-27 Today's Date: 09/25/2022   History of Present Illness 62 y.o. male presents to [facility] hospital on 09/10/2022 with AMS and L weakness from [facility]. 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 Complicated by fluid overload. Paracentesis performed 1/14 and 1/16. PMH includes CVA, COPD, cirrhosis, gastric ulcer.    PT Comments    Patient able to tolerate sitting EOB without abd pain this date. Required intermittent use of RUE for sitting balance and up to min assist. Patient attempting to use LUE to reach for foot board--even with AAROM with OT unable to achieve ROM to allow him to grab/hold with LUE.  Patient with increased respiratory wheeze while seated and reported he was getting fatigued. Transfer attempt deferred due to fatigue and respiratory status (staff later commented that this is how he always breathes--he was unfamiliar to both the PT and OT today). Returned to supine with total assist of 2. Prevalon boots donned.    Recommendations for follow up therapy are one component of a multi-disciplinary discharge planning process, led by the attending physician.  Recommendations may be updated based on patient status, additional functional criteria and insurance authorization.  Follow Up Recommendations  Skilled nursing-short term rehab (<3 hours/day) Can patient physically be transported by private vehicle: No   Assistance Recommended at Discharge Frequent or constant Supervision/Assistance  Patient can return home with the following Two people to help with walking and/or transfers;Two people to help with bathing/dressing/bathroom;Assistance with cooking/housework;Assistance with feeding;Direct supervision/assist for medications management;Direct supervision/assist for financial management;Assist for transportation;Help with stairs or ramp for entrance   Equipment Recommendations  Wheelchair  (measurements PT);Wheelchair cushion (measurements PT);Hospital bed;Other (comment) (hoyer lift)    Recommendations for Other Services       Precautions / Restrictions Precautions Precautions: Fall Precaution Comments: cortrak Restrictions Weight Bearing Restrictions: No     Mobility  Bed Mobility Overal bed mobility: Needs Assistance Bed Mobility: Rolling, Sidelying to Sit, Sit to Sidelying Rolling: Max assist, +2 for physical assistance Sidelying to sit: Max assist, +2 for physical assistance, HOB elevated     Sit to sidelying: Total assist, +2 for physical assistance General bed mobility comments: max a with myself and cna to roll L/R for linen change.  pt. able to attept for reach to bed rail to assist    Transfers                   General transfer comment: deferred due to incr wheezing with sitting; sats 98-100%    Ambulation/Gait                   Stairs             Wheelchair Mobility    Modified Rankin (Stroke Patients Only) Modified Rankin (Stroke Patients Only) Pre-Morbid Rankin Score: Moderate disability Modified Rankin: Severe disability     Balance Overall balance assessment: Needs assistance Sitting-balance support: Single extremity supported, Feet supported Sitting balance-Leahy Scale: Poor Sitting balance - Comments: intermittently using RUE for support; varies min to minguard assist Postural control: Posterior lean                                  Cognition Arousal/Alertness: Awake/alert Behavior During Therapy: WFL for tasks assessed/performed Overall Cognitive Status: No family/caregiver present to determine baseline cognitive functioning  General Comments: pt follows commands well with increased time with rt extremities, answering questions with one word responses with  increased time        Exercises      General Comments        Pertinent  Vitals/Pain Pain Assessment Pain Assessment: No/denies pain    Home Living                          Prior Function            PT Goals (current goals can now be found in the care plan section) Acute Rehab PT Goals Patient Stated Goal: to improve strenght and mobility, reduce falls risk PT Goal Formulation: With patient Time For Goal Achievement: 10/09/22 Potential to Achieve Goals: Fair Progress towards PT goals: Not progressing toward goals - comment;Goals downgraded-see care plan (has had medical decline;)    Frequency    Min 2X/week      PT Plan Frequency needs to be updated    Co-evaluation PT/OT/SLP Co-Evaluation/Treatment: Yes Reason for Co-Treatment: Complexity of the patient's impairments (multi-system involvement);For patient/therapist safety;Other (comment) (fatigues easily) PT goals addressed during session: Mobility/safety with mobility;Balance        AM-PAC PT "6 Clicks" Mobility   Outcome Measure  Help needed turning from your back to your side while in a flat bed without using bedrails?: Total Help needed moving from lying on your back to sitting on the side of a flat bed without using bedrails?: Total Help needed moving to and from a bed to a chair (including a wheelchair)?: Total Help needed standing up from a chair using your arms (e.g., wheelchair or bedside chair)?: Total Help needed to walk in hospital room?: Total Help needed climbing 3-5 steps with a railing? : Total 6 Click Score: 6    End of Session   Activity Tolerance: Patient limited by fatigue Patient left: with call bell/phone within reach;in bed;with bed alarm set Nurse Communication: Mobility status;Other (comment) (incr wheezing in sitting) PT Visit Diagnosis: Other abnormalities of gait and mobility (R26.89);Muscle weakness (generalized) (M62.81);Hemiplegia and hemiparesis Hemiplegia - Right/Left: Left Hemiplegia - caused by: Cerebral infarction     Time:  9629-5284 PT Time Calculation (min) (ACUTE ONLY): 24 min  Charges:  $Therapeutic Activity: 8-22 mins                      Jerolyn Center, PT Acute Rehabilitation Services  Office [phone]    Zena Amos 09/25/2022, 3:14 PM

## 2022-09-25 NOTE — TOC Progression Note (Signed)
Transition of Care [facility]) - Progression Note    Patient Details  Name: Jacob Bass MRN: [id number] Date of Birth: [birth year]  Transition of Care [facility] CM/SW [place], [place] Phone Number: 09/25/2022, 3:46 PM  Clinical Narrative:   CSW sent medical records over course of patient's hospitalization to DSS to support need for guardianship petition. Awaiting decision from DSS. CSW to follow.         Expected Discharge Plan and Services                                               Social Determinants of Health (SDOH) Interventions    Readmission Risk Interventions     No data to display

## 2022-09-25 NOTE — Plan of Care (Signed)
  Problem: Education: Goal: Knowledge of disease or condition will improve 09/25/2022 0148 by Wayna Chalet, RN Outcome: Progressing 09/25/2022 0108 by Wayna Chalet, RN Outcome: Progressing Goal: Knowledge of secondary prevention will improve (MUST DOCUMENT ALL) 09/25/2022 0148 by Wayna Chalet, RN Outcome: Progressing 09/25/2022 0108 by Wayna Chalet, RN Outcome: Progressing Goal: Knowledge of patient specific risk factors will improve Elta [place] N/A or DELETE if not current risk factor) 09/25/2022 0148 by Wayna Chalet, RN Outcome: Progressing 09/25/2022 0108 by Wayna Chalet, RN Outcome: Progressing   Problem: Ischemic Stroke/TIA Tissue Perfusion: Goal: Complications of ischemic stroke/TIA will be minimized 09/25/2022 0148 by Wayna Chalet, RN Outcome: Progressing 09/25/2022 0108 by Wayna Chalet, RN Outcome: Progressing   Problem: Coping: Goal: Will verbalize positive feelings about self 09/25/2022 0148 by Wayna Chalet, RN Outcome: Progressing 09/25/2022 0108 by Wayna Chalet, RN Outcome: Progressing Goal: Will identify appropriate support needs 09/25/2022 0148 by Wayna Chalet, RN Outcome: Progressing 09/25/2022 0108 by Wayna Chalet, RN Outcome: Progressing

## 2022-09-25 NOTE — Progress Notes (Signed)
Speech Language Pathology Treatment: Dysphagia  Patient Details Name: Jacob Bass MRN: [id number] DOB: [birth year] Today's Date: 09/25/2022 Time: 3244-0102 SLP Time Calculation (min) (ACUTE ONLY): 20 min  Assessment / Plan / Recommendation Clinical Impression  Patient seen by SLP for skilled treatment focused on dysphagia goals. Patient was awake, alert and cooperative, able to follow commands. SLP completed oral care with Yankauer suction and removed thick secretions throughout oral cavity which were yellowish with red tinge. After oral care, patient receptive to spoon sips of honey thick liquids. He did not exhibit any anterior spillage but did have oral transit delays and mild amount of PO residue post swallow. Swallows were initiated with each bolus but very delayed. No immediate coughing or throat clearing but after three spoons of honey thick juice, increase in audible secretions with patient initiating a cough response which was strong but was not productive. In addition he exhibited s/s that he was needing to regurgitate, however this did not occur. SLP continues to recommend NPO status and will continue to follow patient for PO trials.   HPI HPI: Jacob Bass is a 62 y.o. male who presented to ED from [facility] via EMS with left sided weakness, speech changes, wheezing. PMH/PSH includes prior CVA, subarachnoid hemorrhage, cirrhosis, hep C, hx of etOH abuse, dementia, COPD.  Pt known to SLP from prior admissions, with baseline dysarthria and oral dysphagia. He was most recently seen by SLP on 04/13/22 with recommendations for Dysphagia 1 solids and thin liquids. NPO with Cortrak, can't get PEG due to acities.      SLP Plan         Recommendations for follow up therapy are one component of a multi-disciplinary discharge planning process, led by the attending physician.  Recommendations may be updated based on patient status, additional functional criteria and insurance authorization.     Recommendations  Diet recommendations: NPO Medication Administration: Via alternative means                Oral Care Recommendations: Oral care QID;Staff/trained caregiver to provide oral care Follow Up Recommendations: Follow physician's recommendations for discharge plan and follow up therapies Assistance recommended at discharge: Frequent or constant Supervision/Assistance SLP Visit Diagnosis: Dysphagia, oropharyngeal phase (R13.12)           Sonia Baller, MA, CCC-SLP Speech Therapy

## 2022-09-25 NOTE — Progress Notes (Signed)
Patient noted to have a small bowel movement. Liquid in consistency. Bright red in color. Notified covering physician, Dr Alcario Drought. See orders.

## 2022-09-25 NOTE — Plan of Care (Signed)
  Problem: Education: Goal: Knowledge of disease or condition will improve Outcome: Progressing   Problem: Nutrition: Goal: Risk of aspiration will decrease Outcome: Progressing   Problem: Nutrition: Goal: Dietary intake will improve Outcome: Progressing   Problem: Education: Goal: Knowledge of General Education information will improve Description: Including pain rating scale, medication(s)/side effects and non-pharmacologic comfort measures Outcome: Progressing   Problem: Pain Managment: Goal: General experience of comfort will improve Outcome: Progressing   Problem: Safety: Goal: Ability to remain free from injury will improve Outcome: Progressing   Problem: Skin Integrity: Goal: Risk for impaired skin integrity will decrease Outcome: Progressing

## 2022-09-25 NOTE — Progress Notes (Signed)
Occupational Therapy Treatment Patient Details Name: Jacob Bass MRN: [id number] DOB: 05-18-1961 Today's Date: 09/25/2022   History of present illness 62 y.o. male presents to [facility] hospital on 09/10/2022 with AMS and L weakness from [facility]. 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 Complicated by fluid overload. Paracentesis performed 1/14 and 1/16. PMH includes CVA, COPD, cirrhosis, gastric ulcer.   OT comments  Pt seen in conjunction with PT to maximize pts activity tolerance and optimize pt participation. Pt continues to present with impaired sitting balance, decreased activity tolerance and generalized deconditioning. Pt currently requires MAX A +2 for supine>sit, pt able to sit EOB with MINA- min guard. Pt with increased wheezing from EOB therefore deferred OOB attempts however sats WFL. Pt with fluctuating tone in LUE, provided light PROM to LUE. Pt would continue to benefit from skilled occupational therapy while admitted and after d/c to address the below listed limitations in order to improve overall functional mobility and facilitate independence with BADL participation. DC plan remains appropriate, will follow acutely per POC.      Recommendations for follow up therapy are one component of a multi-disciplinary discharge planning process, led by the attending physician.  Recommendations may be updated based on patient status, additional functional criteria and insurance authorization.    Follow Up Recommendations  Skilled nursing-short term rehab (<3 hours/day)     Assistance Recommended at Discharge Frequent or constant Supervision/Assistance  Patient can return home with the following  Two people to help with walking and/or transfers;Two people to help with bathing/dressing/bathroom   Equipment Recommendations  None recommended by OT    Recommendations for Other Services      Precautions / Restrictions Precautions Precautions: Fall Precaution Comments:  cortrak Restrictions Weight Bearing Restrictions: No       Mobility Bed Mobility Overal bed mobility: Needs Assistance Bed Mobility: Rolling, Sidelying to Sit, Sit to Sidelying Rolling: Max assist, +2 for physical assistance Sidelying to sit: Max assist, +2 for physical assistance, HOB elevated     Sit to sidelying: Total assist, +2 for physical assistance General bed mobility comments: max a with myself and cna to roll L/R for linen change.  pt. able to attept for reach to bed rail to assist    Transfers                   General transfer comment: deferred due to incr wheezing with sitting; sats 98-100%     Balance Overall balance assessment: Needs assistance Sitting-balance support: Single extremity supported, Feet supported Sitting balance-Leahy Scale: Poor Sitting balance - Comments: intermittently using RUE for support; varies min to minguard assist Postural control: Posterior lean                                 ADL either performed or assessed with clinical judgement   ADL       Grooming: Wash/dry face;Sitting;Total assistance Grooming Details (indicate cue type and reason): pt needed total A to wash face from EOB             Lower Body Dressing: Total assistance;Bed level Lower Body Dressing Details (indicate cue type and reason): sock mgmt   Toilet Transfer Details (indicate cue type and reason): deferred transfer d/t increase wheezing while sitting EOB         Functional mobility during ADLs: Maximal assistance;+2 for physical assistance;+2 for safety/equipment (bed mobility only) General ADL Comments: ADL participation impacted by  increased WOB, decreased activity tolerance and generalized deconditioning    Extremity/Trunk Assessment Upper Extremity Assessment Upper Extremity Assessment: LUE deficits/detail LUE Deficits / Details: profound weakness w/ hx of CVA. fluctuating tone in hands though some tone with elbow ROM.  elevated on pillow, placed wash cloth in L palm as nails are long in order to maintain skin integrity and decrease tone of digits LUE Coordination: decreased fine motor;decreased gross motor   Lower Extremity Assessment Lower Extremity Assessment: Defer to PT evaluation        Vision Baseline Vision/History: 0 No visual deficits (per gross assessment however will continue to assess)     Perception Perception Perception: Not tested   Praxis Praxis Praxis: Not tested    Cognition Arousal/Alertness: Awake/alert Behavior During Therapy: WFL for tasks assessed/performed Overall Cognitive Status: No family/caregiver present to determine baseline cognitive functioning                                 General Comments: pt follows commands well with increased time with rt extremities, answering questions with one word responses with  increased time, conversing minimally        Exercises Other Exercises Other Exercises: light PROM to LUE to decrease tone, placed wash cloth in L palm to decrease tone and maintain skin integrity as pts nails digging into skin    Shoulder Instructions       General Comments increased wheezing during session, pt on RA with SpO2 WFL, noted blood around cortrak, pitting edema in LLE    Pertinent Vitals/ Pain       Pain Assessment Pain Assessment: No/denies pain  Home Living                                          Prior Functioning/Environment              Frequency  Min 2X/week        Progress Toward Goals  OT Goals(current goals can now be found in the care plan section)  Progress towards OT goals: Progressing toward goals (incrementally)  Acute Rehab OT Goals OT Goal Formulation: Patient unable to participate in goal setting Time For Goal Achievement: 09/25/22 Potential to Achieve Goals: Fair  Plan Discharge plan remains appropriate;Frequency remains appropriate    Co-evaluation      Reason  for Co-Treatment: Complexity of the patient's impairments (multi-system involvement);To address functional/ADL transfers;For patient/therapist safety PT goals addressed during session: Mobility/safety with mobility;Balance OT goals addressed during session: ADL's and self-care;Strengthening/ROM      AM-PAC OT "6 Clicks" Daily Activity     Outcome Measure   Help from another person eating meals?: Total (NPO) Help from another person taking care of personal grooming?: A Lot Help from another person toileting, which includes using toliet, bedpan, or urinal?: Total Help from another person bathing (including washing, rinsing, drying)?: A Lot Help from another person to put on and taking off regular upper body clothing?: A Lot Help from another person to put on and taking off regular lower body clothing?: Total 6 Click Score: 9    End of Session    OT Visit Diagnosis: Unsteadiness on feet (R26.81);Other abnormalities of gait and mobility (R26.89);Muscle weakness (generalized) (M62.81)   Activity Tolerance Patient tolerated treatment well   Patient Left in bed;with call bell/phone within reach;with bed  alarm set   Nurse Communication Mobility status;Other (comment) (via secure chat)        Time: 1610-9604 OT Time Calculation (min): 24 min  Charges: OT General Charges $OT Visit: 1 Visit OT Treatments $Self Care/Home Management : 8-22 mins  Lenor Derrick., COTA/L Acute Rehabilitation Services [phone]   Barron Schmid 09/25/2022, 3:45 PM

## 2022-09-25 NOTE — Progress Notes (Signed)
PROGRESS NOTE  Jacob Bass  DOB: 15-Jul-1961  PCP: Donia Ast., MD QMV:[id number]  DOA: 09/10/2022  LOS: 14 days  Hospital Day: 16  Brief narrative: Jacob Bass is a 62 y.o. male with PMH significant for history of stroke with residual left-sided deficits, HTN, liver cirrhosis, chronic anemia, COPD, recent diagnosis of COVID who lives at [facility]. 1/8, patient was sent to the ED for altered mental status and weakness.  Last known normal the previous night.  In the ED, patient was afebrile, hemodynamically stable Labs with creatinine elevated to 2.09, ammonia level elevated to 99 CT head unremarkable MRI brain positive for small acute infarct in the left coronary data CTA of head and neck showed focal stenosis at the right V1 and evidence suspicious for pseudoaneurysm Also showed moderate focal stenosis of the right P1 left P2.  Admitted to [facility] Neurology consulted for acute stroke  His hospital course has been complicated by recurrent ascites and dysphagia.  He has required paracentesis x 3.  Dysphagia is not improving and he is not a candidate for PEG tube placement due to recurrent ascites.  Currently has core track feeding. Palliative care consulted.  Legal guardianship is in process. Patient remains full code  Subjective: Patient was seen and examined this morning.   Remains grunting.  Awake but mumbling.  Has strong expressive aphasia.  Able to follow motor commands.  Ongoing core track feeding.  Abdomen distended and tight again.  Assessment and plan: Acute CVA prior CVA with left-sided deficits Brought to the ED after patient was noted to be less interactive compared to her baseline.   MRI showed left acute coronary due to small infarct  Stroke workup completed.  Neurology consult appreciated Echo showed EF of 55 to 60% with no cardiac source of embolism A1c 4.5, LDL 74 PTA not on any antithrombotic or anticoagulant. Patient has been started on aspirin 81 mg  daily.  No DAPT given thrombocytopenia. Statin not started because of underlying liver cirrhosis Current deficit include expressive aphasia and dysphagia.  Acute medical encephalopathy Hepatic encephalopathy Ammonia level was elevated to 99.  His altered mentation was probably partly due to hyperammonemia Currently getting lactic through tube feeding.  Ammonia level improving, trend as below. Mental status improved but overall neurological status is compromised because of acute stroke.  Liver cirrhosis with anasarca Hypoalbuminemia Essential hypertension Per history, patient has worsening edema since October He has significant anasarca. Unable to consent but because of medical city, paracentesis has been done 3 times.  Last done on 1/21 when 3.5 L were removed. Ascites gradually accumulating again.  I will order repeat paracentesis. Unable to consent but need to proceed because of medical necessity. PTA on metoprolol 25 mg twice daily, Lasix 40 mg daily, Amlodipine 5 mg daily, lisinopril 5 mg daily Blood pressure fluctuating.  Metoprolol and Lasix have been resumed. Others remain on hold. Clinically he has bilateral pedal edema 1-2+ and fast recommending ascites.  1/22, I started him on IV Lasix 40 mg. Continue Protonix Recent Labs  Lab 09/19/22 0639 09/20/22 0405 09/21/22 0604 09/22/22 1100 09/23/22 0335 09/24/22 0328  AST  --  70*  --  81*  --  80*  ALT  --  39  --  52*  --  51*  ALKPHOS  --  71  --  118  --  102  BILITOT  --  1.0  --  1.0  --  1.2  PROT  --  6.9  --  6.6  --  6.6  ALBUMIN  --  1.9*  --  1.8*  --  2.2*  AMMONIA 29  --   --   --   --   --   PLT 85* 77* 92* 96* 94* 67*   Acute kidney injury Baseline creatinine around 1.  Presented with creatinine of 2.08, peaked at 2.12.  Fluctuating.  Up in last 24 hours since Lasix was given.  Because of overall volume overload status, I would continue Lasix. Recent Labs    09/16/22 0619 09/17/22 0515 09/18/22 0516  09/19/22 0639 09/20/22 0405 09/21/22 0604 09/22/22 1100 09/23/22 0335 09/24/22 0328 09/25/22 1020  BUN 14 19 28* 39* 50* 58* 61* 65* 66* 72*  CREATININE 1.39* 1.31* 1.49* 1.91* 2.12* 2.00* 1.87* 1.86* 1.66* 1.86*   Chronic macrocytic anemia Secondary to liver disease.  Hemoglobin stable between 8 and 9. Recent Labs    09/10/22 1115 09/10/22 1611 09/11/22 1708 09/20/22 0405 09/21/22 0604 09/22/22 1100 09/23/22 0335 09/24/22 0328  HGB  --   --    < > 8.3* 8.8* 8.4* 8.2* 8.7*  MCV  --   --    < > 102.0* 104.2* 102.0* 106.2* 109.0*  VITAMINB12  --  2,050*  --   --   --   --   --   --   FOLATE >40.0  --   --   --   --   --   --   --    < > = values in this interval not displayed.   COPD Continue bronchodilators  Dysphagia Core track feeding to continue.  Patient is not a good candidate for PEG tube placement because of poor prognosis and frequent reaccumulation of ascites Speech therapy following   Hypoglycemia Blood glucose value was low when patient was altered and unable to take oral intake.  Currently on core track feeding.  BPH Flomax   Mobility: PT following.  SNF recommended  Goals of care   Code Status: Full Code    Scheduled Meds:  aspirin  325 mg Per Tube Daily   feeding supplement (PROSource TF20)  60 mL Per Tube Daily   free water  200 mL Per Tube Q4H   furosemide  40 mg Intravenous Daily   heparin injection (subcutaneous)  5,000 Units Subcutaneous Q8H   lidocaine (PF)  15 mL Intradermal Once   metoprolol tartrate  25 mg Per Tube BID   mometasone-formoterol  2 puff Inhalation BID   pantoprazole (PROTONIX) IV  40 mg Intravenous Q24H   tamsulosin  0.4 mg Oral Daily   umeclidinium bromide  1 puff Inhalation Daily    PRN meds: [DISCONTINUED] acetaminophen **OR** acetaminophen (TYLENOL) oral liquid 160 mg/5 mL **OR** acetaminophen, ipratropium-albuterol, senna-docusate   Infusions:   feeding supplement (JEVITY 1.5 CAL/FIBER) 1,000 mL (09/25/22  1410)    Skin assessment:  Pressure Injury 09/24/22 Heel Left;Posterior Unstageable - Full thickness tissue loss in which the base of the injury is covered by slough (yellow, tan, gray, green or brown) and/or eschar (tan, brown or black) in the wound bed. Round, dark brown spot (Active)  09/24/22 0903  Location: Heel  Location Orientation: Left;Posterior  Staging: Unstageable - Full thickness tissue loss in which the base of the injury is covered by slough (yellow, tan, gray, green or brown) and/or eschar (tan, brown or black) in the wound bed.  Wound Description (Comments): Round, dark brown spot  Present on Admission:     Nutritional status:  Body mass index is 27.28 kg/m.  Nutrition Problem: Moderate Malnutrition Etiology: chronic illness Signs/Symptoms: moderate fat depletion, severe muscle depletion     Diet:  Diet Order             Diet NPO time specified  Diet effective now                   DVT prophylaxis:  heparin injection 5,000 Units Start: 09/11/22 2330 SCD's Start: 09/10/22 1605   Antimicrobials: None currently Fluid: None currently Consultants: Palliative care, IR Family Communication: None at bedside.  1/19, called and updated [facility] for guardianship Jacob Bass, [phone]).  I asked for advanced directives.  She was going to reach out to her supervisor and get back to me.  I have not heard any thing from her since then.  Status is: Inpatient  Continue in-hospital care because:  On core track feeding..  Ascites is already accumulating.  Ordered for paracentesis again. Level of care: Telemetry Medical   Dispo: The patient is from: [facility] long-term care              Anticipated d/c is to: Poor prognosis.              Patient currently is not medically stable to d/c.   Difficult to place patient No    Antimicrobials: Anti-infectives (From admission, onward)    Start     Dose/Rate Route Frequency Ordered Stop    09/10/22 1345  cefTRIAXone (ROCEPHIN) 1 g in sodium chloride 0.9 % 100 mL IVPB        1 g 200 mL/hr over 30 Minutes Intravenous  Once 09/10/22 1331 09/10/22 1605   09/10/22 1345  azithromycin (ZITHROMAX) 500 mg in sodium chloride 0.9 % 250 mL IVPB        500 mg 250 mL/hr over 60 Minutes Intravenous  Once 09/10/22 1331 09/10/22 1605       Objective: Vitals:   09/25/22 1139 09/25/22 1208  BP: 119/87 132/89  Pulse: 75 72  Resp: 16 20  Temp: 98 F (36.7 C) 98.6 F (37 C)  SpO2: 100% 100%    Intake/Output Summary (Last 24 hours) at 09/25/2022 1427 Last data filed at 09/25/2022 1000 Gross per 24 hour  Intake 100 ml  Output 1050 ml  Net -950 ml   Filed Weights   09/21/22 0500 09/23/22 0016 09/25/22 0500  Weight: 76.1 kg 79.2 kg 79 kg   Weight change:  Body mass index is 27.28 kg/m.   Physical Exam: General exam: Middle-aged African-American male.  Not in pain Skin: No rashes, lesions or ulcers. HEENT: Atraumatic, normocephalic, no obvious bleeding.  Core track feeding ongoing Lungs: Diminished air entry in both bases.  Moaning but not requiring supplemental oxygen CVS: Regular rate and rhythm, no murmur GI/Abd: Worsening abdominal distention and firmness.  Nontender on touch. CNS: Alert, awake, not oriented.  Has expressive aphasia.  Able to follow motor commands Psychiatry: Sad affect Extremities: 2+ bilateral pedal edema, no calf tenderness  Data Review: I have personally reviewed the laboratory data and studies available.  F/u labs ordered Unresulted Labs (From admission, onward)     Start     Ordered   09/26/22 0500  Basic metabolic panel  Daily,   R     Question:  Specimen collection method  Answer:  Lab=Lab collect   09/25/22 0837            Total time spent in review of labs  and imaging, patient evaluation, formulation of plan, documentation and communication with family: 45 minutes  Signed, Lorin Glass, MD [facility] 09/25/2022 Spoke with  DSS today.  See above.  Accumulating

## 2022-09-25 NOTE — Plan of Care (Signed)

## 2022-09-25 NOTE — Progress Notes (Signed)
Pt with cirrhosis and recurrent ascites. Needs repeat paracentesis. Pt remains with altered mental status due to encephalopathy and CVA Cannot consent self. No family contacts to discuss. Currently in process of DSS obtaining guardianship.  Paracentesis declared medically necessary by attending MD. Will proceed.  Ascencion Dike PA-C Interventional Radiology 09/25/2022 3:25 PM

## 2022-09-26 DIAGNOSIS — I639 Cerebral infarction, unspecified: Secondary | ICD-10-CM | POA: Diagnosis not present

## 2022-09-26 DIAGNOSIS — B182 Chronic viral hepatitis C: Secondary | ICD-10-CM | POA: Diagnosis not present

## 2022-09-26 DIAGNOSIS — K7031 Alcoholic cirrhosis of liver with ascites: Secondary | ICD-10-CM | POA: Diagnosis not present

## 2022-09-26 DIAGNOSIS — K7682 Hepatic encephalopathy: Secondary | ICD-10-CM | POA: Diagnosis not present

## 2022-09-26 DIAGNOSIS — R040 Epistaxis: Secondary | ICD-10-CM

## 2022-09-26 LAB — CBC
HCT: 27.2 % — ABNORMAL LOW (ref 39.0–52.0)
Hemoglobin: 8.9 g/dL — ABNORMAL LOW (ref 13.0–17.0)
MCH: 34.5 pg — ABNORMAL HIGH (ref 26.0–34.0)
MCHC: 32.7 g/dL (ref 30.0–36.0)
MCV: 105.4 fL — ABNORMAL HIGH (ref 80.0–100.0)
Platelets: 101 10*3/uL — ABNORMAL LOW (ref 150–400)
RBC: 2.58 MIL/uL — ABNORMAL LOW (ref 4.22–5.81)
RDW: 21.3 % — ABNORMAL HIGH (ref 11.5–15.5)
WBC: 10.6 10*3/uL — ABNORMAL HIGH (ref 4.0–10.5)
nRBC: 0 % (ref 0.0–0.2)

## 2022-09-26 LAB — BASIC METABOLIC PANEL
Anion gap: 8 (ref 5–15)
BUN: 71 mg/dL — ABNORMAL HIGH (ref 8–23)
CO2: 24 mmol/L (ref 22–32)
Calcium: 7.9 mg/dL — ABNORMAL LOW (ref 8.9–10.3)
Chloride: 111 mmol/L (ref 98–111)
Creatinine, Ser: 1.81 mg/dL — ABNORMAL HIGH (ref 0.61–1.24)
GFR, Estimated: 41 mL/min — ABNORMAL LOW (ref >60)
Glucose, Bld: 109 mg/dL — ABNORMAL HIGH (ref 70–99)
Potassium: 4.2 mmol/L (ref 3.5–5.1)
Sodium: 143 mmol/L (ref 135–145)

## 2022-09-26 LAB — TYPE AND SCREEN
ABO/RH(D): O POS
Antibody Screen: NEGATIVE

## 2022-09-26 LAB — GLUCOSE, CAPILLARY
Glucose-Capillary: 93 mg/dL (ref 70–99)
Glucose-Capillary: 116 mg/dL — ABNORMAL HIGH (ref 70–99)
Glucose-Capillary: 102 mg/dL — ABNORMAL HIGH (ref 70–99)
Glucose-Capillary: 74 mg/dL (ref 70–99)
Glucose-Capillary: 77 mg/dL (ref 70–99)
Glucose-Capillary: 66 mg/dL — ABNORMAL LOW (ref 70–99)
Glucose-Capillary: 141 mg/dL — ABNORMAL HIGH (ref 70–99)

## 2022-09-26 LAB — ABO/RH: ABO/RH(D): O POS

## 2022-09-26 MED ORDER — DEXTROSE 50 % IV SOLN
1.0000 | INTRAVENOUS | Status: DC | PRN
  Administered 2022-09-26 – 2022-10-23 (×5): 50 mL via INTRAVENOUS
  Filled 2022-09-26 (×5): qty 50

## 2022-09-26 NOTE — Progress Notes (Addendum)
TR[facility] night cross cover note:   I was notified by RN of patient's low CBG result of 66.  Does not appear to be associated with any overt change in the patient's mental status.  He is currently n.p.o. in the context of a known gastrointestinal bleed, for which gastroenterology has been consulted.  Not receiving any insulin at this time.  I subsequently placed order for prn amp of D50 every hour prn for CBG less than 70, and ordered a repeat CBG to be checked at 2200 this evening.  Additionally, in the setting of the above report of recent diagnosis of GI bleed, also ordered CBC to be checked in the morning.   UPDATE: Recurrent hypoglycemia on serial CBG monitoring overnight, with this hypoglycemia refractory to 3 prn amp's of D50.  Consequently, I placed order to start D5 half LR at 75 cc/h, with close ensuing monitoring of CBG trend in response to these IV fluids.    Babs Bertin, DO Hospitalist

## 2022-09-26 NOTE — Progress Notes (Signed)
Speech Language Pathology Treatment: Dysphagia  Patient Details Name: Jacob Bass MRN: [id number] DOB: [birth year] Today's Date: 09/26/2022 Time: 1415-1430 SLP Time Calculation (min) (ACUTE ONLY): 15 min  Assessment / Plan / Recommendation Clinical Impression  Patient seen by SLP for skilled treatment focused on dysphagia goals. Patient was awake, alert, cooperative. SLP performed oral care via toothette sponge and Yankauer suction, removing moderate amount of thick yellowish, red tinged secretions. (Per notes in chart today, "patient had a small amount of bright red blood in the stool last night. This morning, patient had a large amount of hematemesis." In addition, RN reported that patient had a nose bleed and was vomiting blood.) After oral care, patient receptive to pudding thick liquids (apple juice) which SLP administered via spoon. He exhibited prolonged oral holding of boluses and required verbal cue to swallow for 2 of three 3 trials. Patient seems mostly focused on wiping right corner of mouth. (This is where he has had anterior spillage with liquids) but pudding thick liquids did not result in anterior spillage. SLP did suction out a trace amount of PO from thickened liquids from oral cavity when finished giving PO's to patient. No significant change in breathing observed (no wheezing, etc) but patient continues with congested sounding voice and vocalizations. SLP will continue to follow.   HPI HPI: Jacob Bass is a 62 y.o. male who presented to ED from [facility] via EMS with left sided weakness, speech changes, wheezing. PMH/PSH includes prior CVA, subarachnoid hemorrhage, cirrhosis, hep C, hx of etOH abuse, dementia, COPD.  Pt known to SLP from prior admissions, with baseline dysarthria and oral dysphagia. He was most recently seen by SLP on 04/13/22 with recommendations for Dysphagia 1 solids and thin liquids. NPO with Cortrak, can't get PEG due to acities.      SLP Plan  Continue with  current plan of care      Recommendations for follow up therapy are one component of a multi-disciplinary discharge planning process, led by the attending physician.  Recommendations may be updated based on patient status, additional functional criteria and insurance authorization.    Recommendations  Diet recommendations: NPO Medication Administration: Via alternative means                Oral Care Recommendations: Oral care QID;Staff/trained caregiver to provide oral care Follow Up Recommendations: Follow physician's recommendations for discharge plan and follow up therapies Assistance recommended at discharge: Frequent or constant Supervision/Assistance SLP Visit Diagnosis: Dysphagia, oropharyngeal phase (R13.12) Plan: Continue with current plan of care           Sonia Baller, MA, CCC-SLP Speech Therapy

## 2022-09-26 NOTE — Consult Note (Addendum)
[facility] Consult: 10:24 AM 09/26/2022  LOS: 15 days    Referring Provider: Dr Pola Corn   Primary Care Physician:  Donia Ast., MD Primary Gastroenterologist:    Has 1 other med rec # w extensive info !!!   Reason for Consultation: Hematemesis.  Cirrhosis.   HPI: Clester Pickup is a 62 y.o. male.   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  Stroke.  Anemia.  Hypertension.  COPD.  10/2018 EGD for anemia revealed gastric ulcer without stigmata of bleeding.  Biopsies showed reactive gastropathy, no H. pylori. Admission 07/2022 for AMS attributed to small vessel disease progression.  Had ascites but refused to cooperate for paracentesis.  Diagnosed as presumed SBP and discharged on prophylactic Cipro as well as lactulose. Colonoscopy was planned but not performed as patient would not drink bowel prep. No recent abdominal CT or MRI imaging.  Arrived at ED 1/8 from facility with confusion, AMS.  Recent COVID provided along with hx dysphagia. Ruled in for stroke, possible pseudoaneurysm. Hospitalization punctuated by dysphagia, ascites, AMS.    Severe aphasia, dysphagia.  Feeding via cortrack.   Legal guardianship in process but is full code. 1/21 paracentesis.  3.4 L removed.   09/19/2022 paracentesis.  5.8 L removed.   09/16/2022 paracentesis 3.8 L removed. IV albumin administered following paracentesis. No fluid studies thus far.   IV furosemide day 3 Was receiving oral lactulose and previous lactulose enema. Continues on Protonix 40 IV daily.  Full dose aspirin now discontinued.  Yesterday evening/overnight had episode of epistaxis and bloody bowel movement.  Again this morning has had an episode of epistaxis, bloody bowel movement as well as hematemesis.  Nurse describes the bowel movement as  red blood, not melena or burgundy. Has not had hypotension, tachycardia.  Room air sats mid 90s to 100% on room air.  Hgb at arrival 8.8, as low as 7.6 and 8.9 today.  Gross Citic, MCV 109. Thrombocytopenic, platelet dear 77, 101 today. INR 1.5 MELD 3.0: 20 at 09/11/2022  MELD-Na: 19 at 09/11/2022   TSH elevated 10.9. GFR 34, improved today at 41. T. bili 1.2.  Alk phos 102.  AST/ALT 80/51.  Prior to this admission was at a nursing home.  No social history known other than that he had a history of alcohol abuse.  Does not appear to have any contact with family    Past Medical History:  Diagnosis Date   Cirrhosis (HCC)    COPD (chronic obstructive pulmonary disease) (HCC)    Gastric ulcer    Stroke [facility])     Past Surgical History:  Procedure Laterality Date   IR PARACENTESIS  09/19/2022   IR PARACENTESIS  09/25/2022    Prior to Admission medications   Medication Sig Start Date End Date Taking? Authorizing Provider  acetaminophen (TYLENOL) 500 MG tablet Take 1,000 mg by mouth every 8 (eight) hours as needed (moderate/severe pain).   Yes [provider]  albuterol (VENTOLIN HFA) 108 (90 Base) MCG/ACT inhaler Inhale 2 puffs into the lungs every 4 (four) hours as needed  for wheezing or shortness of breath.   Yes [provider]  amLODipine (NORVASC) 5 MG tablet Take 5 mg by mouth in the morning. (0900)   Yes [provider]  atorvastatin (LIPITOR) 40 MG tablet Take 40 mg by mouth in the morning. (0900)   Yes [provider]  Calcium Carb-Cholecalciferol (CALCIUM + VITAMIN D3) 600-5 MG-MCG TABS Take 1 tablet by mouth daily at 12 noon. (1200)   Yes [provider]  ciprofloxacin (CIPRO) 500 MG tablet Take 500 mg by mouth in the morning. (0900) Prophylactic.   Yes [provider]  cyanocobalamin (VITAMIN B12) 1000 MCG tablet Take 1,000 mcg by mouth in the morning. (0900)   Yes [provider]  fluticasone-salmeterol (ADVAIR)  250-50 MCG/ACT AEPB Inhale 1 puff into the lungs in the morning and at bedtime. (0800, 2000)   Yes [provider]  folic acid (FOLVITE) 1 MG tablet Take 1 mg by mouth in the morning. (0900)   Yes [provider]  furosemide (LASIX) 20 MG tablet Take 40 mg by mouth in the morning. (0900)   Yes [provider]  ipratropium-albuterol (DUONEB) 0.5-2.5 (3) MG/3ML SOLN Take 3 mLs by nebulization every 4 (four) hours as needed (antiasthmatic).   Yes [provider]  lactulose (CHRONULAC) 10 GM/15ML solution Take 30 g by mouth 3 (three) times daily. (0900, 1400, 2100)   Yes [provider]  latanoprost (XALATAN) 0.005 % ophthalmic solution Place 1 drop into both eyes at bedtime. (2100)   Yes [provider]  lisinopril (ZESTRIL) 5 MG tablet Take 5 mg by mouth in the morning.   Yes [provider]  loperamide (IMODIUM A-D) 2 MG tablet Take 4 mg by mouth 4 (four) times daily as needed for diarrhea or loose stools.   Yes [provider]  magnesium oxide (MAG-OX) 400 (240 Mg) MG tablet Take 400 mg by mouth 3 (three) times daily. (0900, 1400, 2100)   Yes [provider]  melatonin 5 MG TABS Take 5 mg by mouth at bedtime. (2100)   Yes [provider]  metoprolol tartrate (LOPRESSOR) 25 MG tablet Take 25 mg by mouth 2 (two) times daily. (0800, 2000)   Yes [provider]  Nutritional Supplements (NUTRITIONAL DRINK) LIQD Take 120 mLs by mouth 3 (three) times daily. House supplement (0900, 1400, 2100)   Yes [provider]  pantoprazole (PROTONIX) 40 MG tablet Take 40 mg by mouth 2 (two) times daily. (0800, 2000)   Yes [provider]  tamsulosin (FLOMAX) 0.4 MG CAPS capsule Take 0.4 mg by mouth in the morning. (0900)   Yes [provider]  tiotropium (SPIRIVA) 18 MCG inhalation capsule Place 18 mcg into inhaler and inhale in the morning. (0900)   Yes [provider]    Scheduled  Meds:  feeding supplement (PROSource TF20)  60 mL Per Tube Daily   free water  200 mL Per Tube Q4H   furosemide  40 mg Intravenous Daily   lidocaine (PF)  15 mL Intradermal Once   metoprolol tartrate  25 mg Per Tube BID   pantoprazole (PROTONIX) IV  40 mg Intravenous Q24H   tamsulosin  0.4 mg Oral Daily   Infusions:  feeding supplement (JEVITY 1.5 CAL/FIBER) 1,000 mL (09/25/22 1410)   PRN Meds: [DISCONTINUED] acetaminophen **OR** acetaminophen (TYLENOL) oral liquid 160 mg/5 mL **OR** acetaminophen, ipratropium-albuterol, senna-docusate   Allergies as of 09/10/2022 - Review Complete 09/10/2022  Allergen Reaction Noted   Penicillins Other (  See Comments) 09/10/2022    History reviewed. No pertinent family history.  Social History   Socioeconomic History   Marital status: Single    Spouse name: Not on file   Number of children: Not on file   Years of education: Not on file   Highest education level: Not on file  Occupational History   Not on file  Tobacco Use   Smoking status: Not on file   Smokeless tobacco: Not on file  Substance and Sexual Activity   Alcohol use: Not on file   Drug use: Not on file   Sexual activity: Not on file  Other Topics Concern   Not on file  Social History Narrative   Not on file   Social Determinants of Health   Financial Resource Strain: Not on file  Food Insecurity: Not on file  Transportation Needs: Not on file  Physical Activity: Not on file  Stress: Not on file  Social Connections: Not on file  Intimate Partner Violence: Not on file    REVIEW OF SYSTEMS: Not performed as patient unable to answer question due to severe expressive and receptive aphasia.   PHYSICAL EXAM: Vital signs in last 24 hours: Vitals:   09/25/22 2343 09/26/22 0810  BP: 134/68 124/71  Pulse: 80 80  Resp: 16 20  Temp: 98.6 F (37 C) 99.2 F (37.3 C)  SpO2: 99% 99%   Wt Readings from Last 3 Encounters:  09/26/22 78.7 kg    General: Looks  chronically ill.  30 old for age.  Mumbles but not following commands. Head: No signs of head trauma Eyes: No scleral icterus or conjunctival pallor. Ears: Hearing appears to be intact but difficult to say since he has problems with responses. Nose: No blood at naris. Mouth: No lesions or blood in the mouth.  No teeth. Neck: No masses Lungs: No labored breathing or cough.  Lungs clear in front. Heart: RRR Abdomen: Distended, tense, protuberant.  Not tender..   Rectal: Deferred. Musc/Skeltl: No joint redness or swelling.  Thin arms and legs. Extremities: Edema lower extremities. Neurologic: Eyes follow visitors.  Speech severely impaired with obvious expressive aphasia, appears to also have receptive aphasia. Skin: No rash or sores. Psych: Calm without agitation.  Intake/Output from previous day: 01/23 0701 - 01/24 0700 In: 100 [NG/GT:100] Out: 3900 [Urine:500] Intake/Output this shift: Total I/O In: -  Out: 650 [Urine:650]  LAB RESULTS: Recent Labs    09/24/22 0328 09/26/22 0326  WBC 6.8 10.6*  HGB 8.7* 8.9*  HCT 27.9* 27.2*  PLT 67* 101*   BMET Lab Results  Component Value Date   NA 143 09/26/2022   NA 142 09/25/2022   NA 143 09/24/2022   K 4.2 09/26/2022   K 3.9 09/25/2022   K 4.3 09/24/2022   CL 111 09/26/2022   CL 110 09/25/2022   CL 110 09/24/2022   CO2 24 09/26/2022   CO2 23 09/25/2022   CO2 23 09/24/2022   GLUCOSE 109 (H) 09/26/2022   GLUCOSE 98 09/25/2022   GLUCOSE 84 09/24/2022   BUN 71 (H) 09/26/2022   BUN 72 (H) 09/25/2022   BUN 66 (H) 09/24/2022   CREATININE 1.81 (H) 09/26/2022   CREATININE 1.86 (H) 09/25/2022   CREATININE 1.66 (H) 09/24/2022   CALCIUM 7.9 (L) 09/26/2022   CALCIUM 8.0 (L) 09/25/2022   CALCIUM 8.0 (L) 09/24/2022   LFT Recent Labs    09/24/22 0328  PROT 6.6  ALBUMIN 2.2*  AST 80*  ALT 51*  ALKPHOS 102  BILITOT 1.2   PT/INR Lab Results  Component Value Date   INR 1.5 (H) 09/10/2022   Hepatitis Panel No  results for input(s): "HEPBSAG", "HCVAB", "HEPAIGM", "HEPBIGM" in the last 72 hours. C-Diff No components found for: "CDIFF" Lipase     Component Value Date/Time   LIPASE 58 (H) 09/10/2022 1000    Drugs of Abuse     Component Value Date/Time   LABOPIA NONE DETECTED 09/11/2022 1101   COCAINSCRNUR NONE DETECTED 09/11/2022 1101   LABBENZ NONE DETECTED 09/11/2022 1101   AMPHETMU NONE DETECTED 09/11/2022 1101   THCU NONE DETECTED 09/11/2022 1101   LABBARB NONE DETECTED 09/11/2022 1101     RADIOLOGY STUDIES: IR Paracentesis  Result Date: 09/25/2022 INDICATION: Cirrhosis with recurrent ascites. Request for therapeutic paracentesis. EXAM: ULTRASOUND GUIDED RIGHT LOWER QUADRANT PARACENTESIS MEDICATIONS: 1% plain lidocaine, 5 mL COMPLICATIONS: None immediate. PROCEDURE: Informed written consent was obtained from the patient after a discussion of the risks, benefits and alternatives to treatment. A timeout was performed prior to the initiation of the procedure. Initial ultrasound scanning demonstrates a large amount of ascites within the right lower abdominal quadrant. The right lower abdomen was prepped and draped in the usual sterile fashion. 1% lidocaine was used for local anesthesia. Following this, a 19 gauge, 7-cm, Yueh catheter was introduced. An ultrasound image was saved for documentation purposes. The paracentesis was performed. The catheter was removed and a dressing was applied. The patient tolerated the procedure well without immediate post procedural complication. FINDINGS: A total of approximately 3.4 L of clear amber fluid was removed. IMPRESSION: Successful ultrasound-guided paracentesis yielding 3.4 liters of peritoneal fluid. Read by: Brayton El PA-C PLAN: If the patient eventually requires >/=2 paracenteses in a 30 day period, candidacy for formal evaluation by the [facility] will be assessed. Electronically Signed   By: Judie Petit.  Shick  M.D.   On: 09/25/2022 16:56      IMPRESSION:   GI bleed.  Appears to have possible upper and lower as well as nasal sources.  Hematemesis this morning preceded by epistaxis yesterday evening and again this morning.  Red bloody stool reported last night and observed this morning.  Had nonbleeding gastric ulcer on EGD 4 years ago.  No previous colonoscopy.  Outpatient med list includes Protonix 40 mg bid, and continued on IV Protonix this admission.  Chronic anemia.  Hgb as of 330 this morning was stable to slightly improved.  No recheck since the episode of epistaxis/hematemesis/bloody stool this morning  HCV, EtOH cirrhosis.  MELD 20, MELD-Na 19 at admission.  Acute CVA.  Ongoing severe dysphagia and aphasia.  Previous CVA.  Possible HE with ammonia 99 at admission, 29 today.  Lactulose in place.  Ascites.  3 medium to large volume paracentesis during this admission and appears to have recurrent significant ascites by exam today.  Fluid has not been sent for rule out SBP.  In past was presumed to have had SBP and treated with prophylactic ciprofloxacin PTA meds included 40 mg Lasix daily, no spironolactone other diuretics.  Current Oertli receiving Lasix 40 mg iv daily and no other diuretics.  Dysphagia.  Nutrition via core track feeding tube.  Thrombocytopenia.     Coagulopathy.     PLAN:       D/w Dr Lavon Paganini and in the setting of coagulopathy and thrombocytopenia, the this may just be generalized mucosal bleeding.  She wants to observe patient for now and defer decision on scopes depending  on clinical course. Patient has no healthcare POA or anyone who can sign consent.  Legal guardianship is in progress.  So gaining consent for procedures needs to be ironed out.    Jennye Moccasin  09/26/2022, 10:24 AM Phone [phone]   Attending physician's note  I have taken a history, reviewed the chart and examined the patient. I performed a substantive portion of this encounter,  including complete performance of at least one of the key components, in conjunction with the APP. I agree with the APP's note, impression and recommendations.    62 year old male with alcoholic and hepatitis C cirrhosis decompensated with ascites admitted with altered mental status, stroke, has aphasia and dysphagia Dobbhoff feeding tube in place  MELD 3.0: 20 at 09/11/2022  5:08 PM MELD-Na: 19 at 09/11/2022  5:08 PM Calculated from: Serum Creatinine: 1.77 mg/dL at 04/09/5783  6:96 PM Serum Sodium: 142 mmol/L (Using max of 137 mmol/L) at 09/11/2022  5:08 PM Total Bilirubin: 1.8 mg/dL at 10/12/5282 13:24 AM Serum Albumin: 1.7 g/dL at 4/0/1027 25:36 AM INR(ratio): 1.5 at 09/10/2022 10:09 AM Age at listing (hypothetical): 77 years Sex: Male at 09/11/2022  5:08 PM    S/p multiple paracentesis He has been having epistaxis, had an episode of ?  Hematemesis and also bright red blood per rectum  Patient was getting cleaned up when I saw him, had a brown stool  He has thrombocytopenia and also elevated INR in the setting of cirrhosis, mucosal oozing with NG tube trauma Will defer endoscopic evaluation unless needed emergently for therapeutic intervention if he develops signs of active GI hemorrhage.  No further decline in hemoglobin, is unchanged since admission  Patient is not consentable and has no POA Conservative management with supportive care  Hepatic encephalopathy: Lactulose 30 g 3 times daily through NG tube  Overall poor prognosis    K. Scherry Ran , MD [phone]

## 2022-09-26 NOTE — Progress Notes (Signed)
PROGRESS NOTE  Jacob Bass  DOB: July 31, 1961  PCP: Donia Ast., MD FIE:[id number]  DOA: 09/10/2022  LOS: 15 days  Hospital Day: 17  Brief narrative: Jacob Bass is a 62 y.o. male with PMH significant for history of stroke with residual left-sided deficits, HTN, liver cirrhosis, chronic anemia, COPD, recent diagnosis of COVID who lives at [facility]. 1/8, patient was sent to the ED for altered mental status and weakness.  Last known normal the previous night.  In the ED, patient was afebrile, hemodynamically stable Labs with creatinine elevated to 2.09, ammonia level elevated to 99 CT head unremarkable MRI brain positive for small acute infarct in the left coronary data CTA of head and neck showed focal stenosis at the right V1 and evidence suspicious for pseudoaneurysm Also showed moderate focal stenosis of the right P1 left P2.  Admitted to [facility] Neurology consulted for acute stroke  His hospital course has been complicated by recurrent ascites and dysphagia.  He has required paracentesis x 4.  Dysphagia is not improving and he is not a candidate for PEG tube placement due to recurrent ascites.  Currently has core track feeding. Palliative care consulted.  Legal guardianship is in process. Patient remains full code  Subjective: Patient was seen and examined this morning.   Events of last night noted.  Patient had a small amount of bright red blood in the stool last night.  This morning, patient had a large amount of hematemesis. Tube feeding was held. At the time of my evaluation, patient was symptomatic.  Able to mumble only. GI consult obtained.  Assessment and plan: Acute GI bleeding in the setting of liver cirrhosis Acute blood loss anemia. Patient had hematochezia yesterday and hematemesis this morning Tube feeding stopped.  Continued on Protonix. GI consulted. Hemoglobin this morning was 8.9 but it was prior to hematemesis.  Continue to monitor hemoglobin.   Transfuse if less than 7. Recent Labs    09/10/22 1115 09/10/22 1611 09/11/22 1708 09/21/22 0604 09/22/22 1100 09/23/22 0335 09/24/22 0328 09/26/22 0326  HGB  --   --    < > 8.8* 8.4* 8.2* 8.7* 8.9*  MCV  --   --    < > 104.2* 102.0* 106.2* 109.0* 105.4*  VITAMINB12  --  2,050*  --   --   --   --   --   --   FOLATE >40.0  --   --   --   --   --   --   --    < > = values in this interval not displayed.   Liver cirrhosis with anasarca Hypoalbuminemia Essential hypertension Per history, patient has worsening edema since October He has significant anasarca. Unable to consent but because of medical city, paracentesis has been done 4 times.  Last done on 1/23 when 3.4 L were removed. Ascites gradually accumulating again.   PTA on metoprolol 25 mg twice daily, Lasix 40 mg daily, Amlodipine 5 mg daily, lisinopril 5 mg daily Blood pressure fluctuating. Metoprolol and Lasix have been resumed. Others remained on hold. Clinically he has bilateral pedal edema 1-2+ and fast recommending ascites.  Remains on IV Lasix. Continue Protonix Recent Labs  Lab 09/20/22 0405 09/21/22 0604 09/22/22 1100 09/23/22 0335 09/24/22 0328 09/26/22 0326  AST 70*  --  81*  --  80*  --   ALT 39  --  52*  --  51*  --   ALKPHOS 71  --  118  --  102  --  BILITOT 1.0  --  1.0  --  1.2  --   PROT 6.9  --  6.6  --  6.6  --   ALBUMIN 1.9*  --  1.8*  --  2.2*  --   PLT 77* 92* 96* 94* 67* 101*   Acute CVA prior CVA with left-sided deficits Brought to the ED after patient was noted to be less interactive compared to her baseline.   MRI showed left acute coronary due to small infarct  Stroke workup completed.  Neurology consult appreciated Echo showed EF of 55 to 60% with no cardiac source of embolism A1c 4.5, LDL 74 Because of active bleeding, patient is currently not on any antithrombotic or anticoagulant. Statin not started because of underlying liver cirrhosis Current deficit include expressive aphasia  and dysphagia.  Acute medical encephalopathy Hepatic encephalopathy Ammonia level was elevated to 99.  His altered mentation was probably partly due to hyperammonemia Currently getting lactic acid through tube feeding.  Ammonia level improving, trend as below. Mental status is poor today.  Acute kidney injury Baseline creatinine around 1.  Presented with creatinine of 2.08, peaked at 2.12.  Fluctuating.  Currently on IV Lasix.   Recent Labs    09/17/22 0515 09/18/22 0516 09/19/22 0639 09/20/22 0405 09/21/22 0604 09/22/22 1100 09/23/22 0335 09/24/22 0328 09/25/22 1020 09/26/22 0326  BUN 19 28* 39* 50* 58* 61* 65* 66* 72* 71*  CREATININE 1.31* 1.49* 1.91* 2.12* 2.00* 1.87* 1.86* 1.66* 1.86* 1.81*   COPD Continue bronchodilators  Dysphagia Core track feeding currently on hold because of GI bleeding.  Patient is not a good candidate for PEG tube placement because of poor prognosis and frequent reaccumulation of ascites Speech therapy following   BPH Flomax  Impaired mobility PT evaluation obtained.  SNF recommended  Goals of care   Code Status: Full Code.  Poor prognosis.  Palliative consult obtained.  No legal guardian. Given his poor clinical status and poor prognosis, he may need to physicians determination for application of CODE STATUS.  I believe he should be DNR/DNI at least.  If clinical status does not improve, may be hospice candidate.  Scheduled Meds:  feeding supplement (PROSource TF20)  60 mL Per Tube Daily   free water  200 mL Per Tube Q4H   furosemide  40 mg Intravenous Daily   lidocaine (PF)  15 mL Intradermal Once   metoprolol tartrate  25 mg Per Tube BID   pantoprazole (PROTONIX) IV  40 mg Intravenous Q24H   tamsulosin  0.4 mg Oral Daily    PRN meds: [DISCONTINUED] acetaminophen **OR** acetaminophen (TYLENOL) oral liquid 160 mg/5 mL **OR** acetaminophen, ipratropium-albuterol, senna-docusate   Infusions:   feeding supplement (JEVITY 1.5 CAL/FIBER)  1,000 mL (09/25/22 1410)    Skin assessment:  Pressure Injury 09/24/22 Heel Left;Posterior Unstageable - Full thickness tissue loss in which the base of the injury is covered by slough (yellow, tan, gray, green or brown) and/or eschar (tan, brown or black) in the wound bed. Round, dark brown spot (Active)  09/24/22 0903  Location: Heel  Location Orientation: Left;Posterior  Staging: Unstageable - Full thickness tissue loss in which the base of the injury is covered by slough (yellow, tan, gray, green or brown) and/or eschar (tan, brown or black) in the wound bed.  Wound Description (Comments): Round, dark brown spot  Present on Admission:     Nutritional status:  Body mass index is 27.17 kg/m.  Nutrition Problem: Moderate Malnutrition Etiology: chronic illness Signs/Symptoms: moderate  fat depletion, severe muscle depletion     Diet:  Diet Order             Diet NPO time specified  Diet effective now                   DVT prophylaxis:  SCD's Start: 09/10/22 1605   Antimicrobials: None currently Fluid: None currently Consultants: Palliative care, IR, palliative care. Family Communication: None at bedside.  1/19, called and updated [facility] for guardianship [name], [phone]).  I asked for advanced directives.  She was going to reach out to her supervisor and get back to me.  I have not heard any thing from her since then.  Status is: Inpatient  Continue in-hospital care because:  On core track feeding..  Ascites is already accumulating.  Ordered for paracentesis again. Level of care: Telemetry Medical   Dispo: The patient is from: [facility] long-term care              Anticipated d/c is to: Poor prognosis.              Patient currently is not medically stable to d/c.   Difficult to place patient No    Antimicrobials: Anti-infectives (From admission, onward)    Start     Dose/Rate Route Frequency Ordered Stop   09/10/22 1345   cefTRIAXone (ROCEPHIN) 1 g in sodium chloride 0.9 % 100 mL IVPB        1 g 200 mL/hr over 30 Minutes Intravenous  Once 09/10/22 1331 09/10/22 1605   09/10/22 1345  azithromycin (ZITHROMAX) 500 mg in sodium chloride 0.9 % 250 mL IVPB        500 mg 250 mL/hr over 60 Minutes Intravenous  Once 09/10/22 1331 09/10/22 1605       Objective: Vitals:   09/26/22 0810 09/26/22 1119  BP: 124/71 132/69  Pulse: 80 76  Resp: 20 (!) 21  Temp: 99.2 F (37.3 C) 97.8 F (36.6 C)  SpO2: 99% 100%    Intake/Output Summary (Last 24 hours) at 09/26/2022 1345 Last data filed at 09/26/2022 0810 Gross per 24 hour  Intake 100 ml  Output 4050 ml  Net -3950 ml   Filed Weights   09/23/22 0016 09/25/22 0500 09/26/22 0623  Weight: 79.2 kg 79 kg 78.7 kg   Weight change: -0.3 kg Body mass index is 27.17 kg/m.   Physical Exam: General exam: Middle-aged African-American male.  Not in pain Skin: No rashes, lesions or ulcers. HEENT: Atraumatic, normocephalic, no obvious bleeding.  Core track feeding ongoing Lungs: Diminished air entry in both bases.  Moaning but not requiring supplemental oxygen CVS: Regular rate and rhythm, no murmur GI/Abd: Abdomen distended and firm already despite paracentesis yesterday.  Nontender on touch. CNS: Alert, awake, not oriented.  Has expressive aphasia.  Able to follow motor commands Psychiatry: Sad affect Extremities: 2+ bilateral pedal edema, no calf tenderness  Data Review: I have personally reviewed the laboratory data and studies available.  F/u labs ordered Unresulted Labs (From admission, onward)     Start     Ordered   09/26/22 0500  Basic metabolic panel  Daily,   R     Question:  Specimen collection method  Answer:  Lab=Lab collect   09/25/22 0837            Total time spent in review of labs and imaging, patient evaluation, formulation of plan, documentation and communication with family: 55 minutes  Signed,  Lorin Glass, MD [facility] 09/26/2022 Spoke with DSS today.  See above.  Accumulating

## 2022-09-26 NOTE — Progress Notes (Signed)
Patient in room with a nose bleed and vomiting blood.  Patient suctioned and continued to vomit.  Once vomiting stopped patient was given a bath due to a bloody bowel movement.  Provider notified and was ordered to stop tube feeding and continue to monitor patient.

## 2022-09-27 DIAGNOSIS — K7031 Alcoholic cirrhosis of liver with ascites: Secondary | ICD-10-CM | POA: Diagnosis not present

## 2022-09-27 DIAGNOSIS — R195 Other fecal abnormalities: Secondary | ICD-10-CM

## 2022-09-27 DIAGNOSIS — I639 Cerebral infarction, unspecified: Secondary | ICD-10-CM | POA: Diagnosis not present

## 2022-09-27 LAB — CBC
HCT: 27.4 % — ABNORMAL LOW (ref 39.0–52.0)
Hemoglobin: 9.1 g/dL — ABNORMAL LOW (ref 13.0–17.0)
MCH: 34.6 pg — ABNORMAL HIGH (ref 26.0–34.0)
MCHC: 33.2 g/dL (ref 30.0–36.0)
MCV: 104.2 fL — ABNORMAL HIGH (ref 80.0–100.0)
Platelets: 106 10*3/uL — ABNORMAL LOW (ref 150–400)
RBC: 2.63 MIL/uL — ABNORMAL LOW (ref 4.22–5.81)
RDW: 21.9 % — ABNORMAL HIGH (ref 11.5–15.5)
WBC: 8.6 10*3/uL (ref 4.0–10.5)
nRBC: 0 % (ref 0.0–0.2)

## 2022-09-27 LAB — GLUCOSE, CAPILLARY
Glucose-Capillary: 105 mg/dL — ABNORMAL HIGH (ref 70–99)
Glucose-Capillary: 60 mg/dL — ABNORMAL LOW (ref 70–99)
Glucose-Capillary: 66 mg/dL — ABNORMAL LOW (ref 70–99)
Glucose-Capillary: 77 mg/dL (ref 70–99)
Glucose-Capillary: 86 mg/dL (ref 70–99)
Glucose-Capillary: 90 mg/dL (ref 70–99)
Glucose-Capillary: 92 mg/dL (ref 70–99)

## 2022-09-27 LAB — BASIC METABOLIC PANEL
Anion gap: 7 (ref 5–15)
BUN: 69 mg/dL — ABNORMAL HIGH (ref 8–23)
CO2: 25 mmol/L (ref 22–32)
Calcium: 8.2 mg/dL — ABNORMAL LOW (ref 8.9–10.3)
Chloride: 112 mmol/L — ABNORMAL HIGH (ref 98–111)
Creatinine, Ser: 1.77 mg/dL — ABNORMAL HIGH (ref 0.61–1.24)
GFR, Estimated: 43 mL/min — ABNORMAL LOW (ref >60)
Glucose, Bld: 87 mg/dL (ref 70–99)
Potassium: 3.9 mmol/L (ref 3.5–5.1)
Sodium: 144 mmol/L (ref 135–145)

## 2022-09-27 MED ORDER — DEXTROSE IN LACTATED RINGERS 5 % IV SOLN: INTRAVENOUS | Status: DC

## 2022-09-27 MED ORDER — DEXTROSE 10 % IV SOLN: INTRAVENOUS | Status: DC

## 2022-09-27 NOTE — Progress Notes (Signed)
Speech Language Pathology Treatment: Dysphagia  Patient Details Name: Jacob Bass MRN: [id number] DOB: 01/13/1961 Today's Date: 09/27/2022 Time: 6720-9470 SLP Time Calculation (min) (ACUTE ONLY): 27 min  Assessment / Plan / Recommendation Clinical Impression  Pt was seen for dysphagia therapy in conjunction with OT where pt was able to maintain sitting balance on edge of bed with support when needed from OT and fed himself bites of puree (applesauce, vanilla pudding) after oral care to remove dried labial secretions and cup sips honey thick juice. He continues to exhibit decreased bolus cohesion, oral holding and pocketing but as session progressed his transit time decreased somewhat. He benefited from multimodal cues to look at therapist, move tongue and push pudding back to "swallow hard" as his attention waxed and waned and becomes easily distracted. Also beneficial were verbal cues once bolus in mouth and tactile with therapist tapping on his leg to help initiate transit, "One, two, three, four, swallow". His swallowing became little faster and coordinated. He only had one delayed cough and one brief instance of wheeze after honey thick. He had intermittent mild residue and drooling throughout and hyper focused on wiping his mouth. ST will plan to continue po trial with plan to repeat MBS next week.    HPI HPI: Jacob Bass is a 62 y.o. male who presented to ED from [facility] via EMS with left sided weakness, speech changes, wheezing. PMH/PSH includes prior CVA, subarachnoid hemorrhage, cirrhosis, hep C, hx of etOH abuse, dementia, COPD.  Pt known to SLP from prior admissions, with baseline dysarthria and oral dysphagia. He was most recently seen by SLP on 04/13/22 with recommendations for Dysphagia 1 solids and thin liquids. NPO with Cortrak, can't get PEG due to acities.      SLP Plan  Continue with current plan of care      Recommendations for follow up therapy are one component of a  multi-disciplinary discharge planning process, led by the attending physician.  Recommendations may be updated based on patient status, additional functional criteria and insurance authorization.    Recommendations  Diet recommendations: NPO Medication Administration: Via alternative means                Oral Care Recommendations: Oral care QID Follow Up Recommendations: Skilled nursing-short term rehab (<3 hours/day) Assistance recommended at discharge: Frequent or constant Supervision/Assistance SLP Visit Diagnosis: Dysphagia, oropharyngeal phase (R13.12) Plan: Continue with current plan of care           Houston Siren  09/27/2022, 11:54 AM

## 2022-09-27 NOTE — Progress Notes (Signed)
Occupational Therapy Treatment Patient Details Name: Jacob Bass MRN: [id number] DOB: [birth year] Today's Date: 09/27/2022   History of present illness 62 y.o. male [facility] to [facility] on [facility]/2022 with AMS and L weakness from [facility]. 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 Complicated by fluid overload. Paracentesis performed 1/14 and 1/16. PMH includes CVA, COPD, cirrhosis, gastric ulcer.   OT comments  Pt continues with incremental progress towards established OT goals. Supine in semi-reclined position on arrival. Pt performing bed mobility with max A +2 and max multimodal cues for sequencing.Upon EOB, pt initially requiring min A, but progressing to min guard A with intermittent min A sitting EOB. Jacob Bass entering room for continued swallow eval, and pt self feeding apple sauce and pudding while maintaining sitting balance. Facilitating attention to task with decreasing environmental distractions, moving out of direct line of sight to focus on Jacob Bass present. Pt requiring increased time and repeated cues to follow commands throughout. Additionally facilitating weightbearing of LUE, and pt perofrming LUE exercises as outlined below. Pt deferring attempt at transfer this date.    Recommendations for follow up therapy are one component of a multi-disciplinary discharge planning process, led by the attending physician.  Recommendations may be updated based on patient status, additional functional criteria and insurance authorization.    Follow Up Recommendations  Skilled nursing-short term rehab (<3 hours/day)     Assistance Recommended at Discharge Frequent or constant Supervision/Assistance  Patient can return home with the following  Two people to help with walking and/or transfers;Two people to help with bathing/dressing/bathroom   Equipment Recommendations  None recommended by OT    Recommendations for Other Services      Precautions / Restrictions Precautions Precautions:  Fall Precaution Comments: cortrak Restrictions Weight Bearing Restrictions: No       Mobility Bed Mobility Overal bed mobility: Needs Assistance Bed Mobility: Sit to Sidelying, Supine to Sit     Supine to sit: Mod assist, +2 for physical assistance Sit to supine: Max assist   General bed mobility comments: Max A with multimodal cues for all parts of mobility.    Transfers                   General transfer comment: deferred; pt refused.     Balance Overall balance assessment: Needs assistance Sitting-balance support: Single extremity supported, Feet supported Sitting balance-Leahy Scale: Poor Sitting balance - Comments: intermittently using RUE for support; varies min to minguard assist                                   ADL either performed or assessed with clinical judgement   ADL Overall ADL's : Needs assistance/impaired Eating/Feeding: Set up;Cueing for safety;Sitting Eating/Feeding Details (indicate cue type and reason): able to self feed apple sauce and pudding as well as drink from open cup with RUE. Jacob Bass joining session to address swallow, and cueing for safety throughout. Significantly increased time to swallow. Grooming: Wash/dry face;Sitting;Min guard;Minimal assistance Grooming Details (indicate cue type and reason): for sitting balance                   Toilet Transfer Details (indicate cue type and reason): Pt with max refusal after sitting EOB eating ~20 minutes         Functional mobility during ADLs: +2 for physical assistance;Maximal assistance (to EOB)      Extremity/Trunk Assessment Upper Extremity Assessment Upper Extremity Assessment:  LUE deficits/detail LUE Deficits / Details: profound weakness w/ hx of CVA. fluctuating tone in hands though some tone with elbow ROM; unable to perform full elbow extension (lacking ~15 degrees). LUE Coordination: decreased fine motor;decreased gross motor   Lower Extremity  Assessment Lower Extremity Assessment: Defer to PT evaluation        Vision   Vision Assessment?: Vision impaired- to be further tested in functional context Additional Comments: Initially appeared to have L inattention, however, making good eye contact with therapist facilitating weightbearing while pt self feeding this session   Perception     Praxis      Cognition Arousal/Alertness: Awake/alert Behavior During Therapy: WFL for tasks assessed/performed Overall Cognitive Status: No family/caregiver present to determine baseline cognitive functioning                                 General Comments: Greater attempts at conversation this session. Pt following all simple commands with incresaed time and up to mod cues. Pt motivated by Jacob Bass entering room to attempt ADL of self feeding and address sitting balance. highly distractable to tech in room and RN entering room. Benefits from minimally distracting environment        Exercises Exercises: General Upper Extremity General Exercises - Upper Extremity Shoulder Flexion: PROM, Left, 5 reps Elbow Flexion: AROM, Left, 10 reps Elbow Extension: PROM, Left, 5 reps Digit Composite Flexion: AROM, Left, 5 reps Composite Extension: AROM, Right, 5 reps    Shoulder Instructions       General Comments Pt fatiguing after self feeding    Pertinent Vitals/ Pain       Pain Assessment Pain Assessment: Faces Faces Pain Scale: No hurt Pain Intervention(s): Monitored during session  Home Living                                          Prior Functioning/Environment              Frequency  Min 2X/week        Progress Toward Goals  OT Goals(current goals can now be found in the care plan section)  Progress towards OT goals: Progressing toward goals (incrementally)  Acute Rehab OT Goals Patient Stated Goal: eat OT Goal Formulation: Patient unable to participate in goal setting Time For Goal  Achievement: 10/09/22 Potential to Achieve Goals: Fair  Plan Discharge plan remains appropriate;Frequency remains appropriate    Co-evaluation                 AM-PAC OT "6 Clicks" Daily Activity     Outcome Measure   Help from another person eating meals?: None (for apple sauce (Jacob Bass in room; remains NPO)) Help from another person taking care of personal grooming?: A Lot Help from another person toileting, which includes using toliet, bedpan, or urinal?: Total Help from another person bathing (including washing, rinsing, drying)?: A Lot Help from another person to put on and taking off regular upper body clothing?: A Lot Help from another person to put on and taking off regular lower body clothing?: Total 6 Click Score: 12    End of Session    OT Visit Diagnosis: Unsteadiness on feet (R26.81);Other abnormalities of gait and mobility (R26.89);Muscle weakness (generalized) (M62.81)   Activity Tolerance Patient tolerated treatment well   Patient Left in bed;with call bell/phone within  reach;with bed alarm set   Nurse Communication Mobility status        Time: 4098-1191 OT Time Calculation (min): 38 min  Charges: OT General Charges $OT Visit: 1 Visit OT Treatments $Self Care/Home Management : 23-37 mins  Jacob Bass, OTR/L [facility] Acute Rehabilitation Office: [phone]   Jacob Bass 09/27/2022, 3:50 PM

## 2022-09-27 NOTE — Progress Notes (Signed)
Nutrition Follow-up  DOCUMENTATION CODES:  Non-severe (moderate) malnutrition in context of chronic illness  INTERVENTION:  Continue tube feeding via cortrak: Jevity 1.5 at 60 ml/h (1440 ml per day) Prosource TF20 60 ml 1x/d Free water 280mL q4h per MD Provides 2060 kcal, 104 gm protein, 1003 ml free water daily (2229mL TF+flush) Monitor guardianship status  NUTRITION DIAGNOSIS:  Moderate Malnutrition related to chronic illness as evidenced by moderate fat depletion, severe muscle depletion. - remains applicable  GOAL:  Patient will meet greater than or equal to 90% of their needs - being met with TF at goal  MONITOR:  Diet advancement, Labs, I & O's, TF tolerance  REASON FOR ASSESSMENT:  New TF (on cortrak list)    ASSESSMENT:  Pt with hx of HTN, COPD, cirrhosis, hx of etOH abuse, dementia, and prior CVA presented to ED from his nursing facility with AMS and weakness. Found to have suffered an acute infarct.   1/9 - MBS, NPO 1/14 - paracentesis, 3.8L of clear yellow fluid removed 1/17 - MBS, NPO, SLP signed off on acute treatment, noted that oral phase of swallow is not functional, paracentesis, 5.8L removed 1/21 - paracentesis, 3.5L removed 1/23 - paracentesis, 3.4L removed   Pt resting in bed at the time of assessment. TF held yesterday after concerns with bleeding, running at goal at the time of assessment. Pt unable to verbalize concerns at this time. Did point to pump but unable to verbalize what he needed. Guardianship process underway.  Nutritionally Relevant Medications: Scheduled Meds:  feeding supplement (PROSource TF20)  60 mL Per Tube Daily   free water  200 mL Per Tube Q4H   furosemide  40 mg Intravenous Daily   pantoprazole IV  40 mg Intravenous Q24H   Continuous Infusions:  feeding supplement (JEVITY 1.5 CAL/FIBER) 1,000 mL (09/25/22 1410)   PRN Meds: senna-docusate  Labs Reviewed: BUN 69, creatinine 1.77 CBG ranges from 60-141 mg/dL over the last  24 hours  NUTRITION - FOCUSED PHYSICAL EXAM: Flowsheet Row Most Recent Value  Orbital Region Mild depletion  Upper Arm Region Moderate depletion  Thoracic and Lumbar Region Moderate depletion  Buccal Region Mild depletion  Temple Region Mild depletion  Clavicle Bone Region Mild depletion  Clavicle and Acromion Bone Region Severe depletion  Scapular Bone Region Moderate depletion  Dorsal Hand Severe depletion  Patellar Region No depletion  Anterior Thigh Region No depletion  Posterior Calf Region No depletion  Edema (RD Assessment) Moderate  [significant edema to the BLE]  Hair Reviewed  Eyes Reviewed  Mouth Reviewed  Skin Reviewed  Nails Reviewed    Diet Order:   Diet Order             Diet NPO time specified  Diet effective now                   EDUCATION NEEDS:  Not appropriate for education at this time  Skin:  Skin Assessment: Reviewed RN Assessment  Last BM:  1/23 - type 7  Height:  Ht Readings from Last 1 Encounters:  09/10/22 5\' 7"  (1.702 m)    Weight:  Wt Readings from Last 1 Encounters:  09/26/22 78.7 kg    Ideal Body Weight:  67.3 kg  BMI:  Body mass index is 27.17 kg/m.  Estimated Nutritional Needs:  Kcal:  1900-2100 kcal/d Protein:  90-105g/d Fluid:  2L/d    Ranell Patrick, RD, LDN Clinical Dietitian RD pager # available in [facility]  After hours/weekend pager #  available in [facility]

## 2022-09-27 NOTE — Progress Notes (Addendum)
PROGRESS NOTE  Jacob Bass  DOB: 12/07/1960  PCP: Donia Ast., MD WUJ:[id number]  DOA: 09/10/2022  LOS: 16 days  Hospital Day: 18  Brief narrative: Jacob Bass is a 62 y.o. male with PMH significant for history of stroke with residual left-sided deficits, HTN, liver cirrhosis, chronic anemia, COPD, recent diagnosis of COVID who lives at [facility]. 1/8, patient was sent to the ED for altered mental status and weakness.  Last known normal the previous night.  In the ED, patient was afebrile, hemodynamically stable Labs with creatinine elevated to 2.09, ammonia level elevated to 99 CT head unremarkable MRI brain positive for small acute infarct in the left coronary data CTA of head and neck showed focal stenosis at the right V1 and evidence suspicious for pseudoaneurysm Also showed moderate focal stenosis of the right P1 left P2.  Admitted to [facility] Neurology consulted for acute stroke  His hospital course has been complicated by recurrent ascites and dysphagia.  He has required paracentesis x 4.  Dysphagia is not improving and he is not a candidate for PEG tube placement due to recurrent ascites.  Currently has core track feeding. Palliative care consulted.  Legal guardianship is in process. Patient remains full code  Subjective: Patient was seen and examined this morning.   Eventful last 24 hours.  He had hematochezia, hematemesis, tube feeding was stopped.  GI consultation was obtained. No further evidence of bleeding.  Had brown stool.  Hemoglobin stable this morning. Tube feeding has been restarted this morning. Ascites seems to have reaccumulated again.  Assessment and plan: Acute GI bleeding in the setting of liver cirrhosis Acute blood loss anemia. 1/24, patient had an episode of hematochezia, hematemesis.  He feeding was stopped.  GI consultation was obtained.  Patient was started on IV Protonix. Seems to have stabilized.  Hemoglobin stable.  No EGD at this time per  GI. Because of hypoglycemic episodes, tube feeding has been resumed this morning. Continue to monitor hemoglobin. Recent Labs    09/10/22 1115 09/10/22 1611 09/11/22 1708 09/22/22 1100 09/23/22 0335 09/24/22 0328 09/26/22 0326 09/27/22 0638  HGB  --   --    < > 8.4* 8.2* 8.7* 8.9* 9.1*  MCV  --   --    < > 102.0* 106.2* 109.0* 105.4* 104.2*  VITAMINB12  --  2,050*  --   --   --   --   --   --   FOLATE >40.0  --   --   --   --   --   --   --    < > = values in this interval not displayed.   Liver cirrhosis with anasarca Hypoalbuminemia Essential hypertension Per history, patient has worsening edema since October He has significant anasarca. Unable to consent but because of medical city, paracentesis has been done 4 times.  Last done on 1/23 when 3.4 L were removed. Ascites gradually accumulating again.   PTA on metoprolol 25 mg twice daily, Lasix 40 mg daily, Amlodipine 5 mg daily, lisinopril 5 mg daily Blood pressure fluctuating. Metoprolol and Lasix have been resumed. Others remained on hold. Clinically he has bilateral pedal edema 1-2+ and fast recommending ascites.  Remains on IV Lasix. Continue Protonix Recent Labs  Lab 09/22/22 1100 09/23/22 0335 09/24/22 0328 09/26/22 0326 09/27/22 0638  AST 81*  --  80*  --   --   ALT 52*  --  51*  --   --   ALKPHOS 118  --  102  --   --   BILITOT 1.0  --  1.2  --   --   PROT 6.6  --  6.6  --   --   ALBUMIN 1.8*  --  2.2*  --   --   PLT 96* 94* 67* 101* 106*   Acute CVA prior CVA with left-sided deficits Brought to the ED after patient was noted to be less interactive compared to her baseline.   MRI showed left acute coronary due to small infarct  Stroke workup completed.  Neurology consult appreciated Echo showed EF of 55 to 60% with no cardiac source of embolism A1c 4.5, LDL 74 Because of active bleeding, patient is currently not on any antithrombotic or anticoagulant. Statin not started because of underlying liver  cirrhosis Current deficit include expressive aphasia and dysphagia.  Acute medical encephalopathy Hepatic encephalopathy Ammonia level was elevated to 99.  His altered mentation was probably partly due to hyperammonemia Currently getting lactic acid through tube feeding.  Ammonia level improving, trend as below. Mental status is poor today.  Acute kidney injury Baseline creatinine around 1.  Presented with creatinine of 2.08, peaked at 2.12.  Fluctuating.  1.77 this morning.  Continue IV Lasix.   Recent Labs    09/18/22 0516 09/19/22 0639 09/20/22 0405 09/21/22 0604 09/22/22 1100 09/23/22 0335 09/24/22 0328 09/25/22 1020 09/26/22 0326 09/27/22 0638  BUN 28* 39* 50* 58* 61* 65* 66* 72* 71* 69*  CREATININE 1.49* 1.91* 2.12* 2.00* 1.87* 1.86* 1.66* 1.86* 1.81* 1.77*   COPD Continue bronchodilators  Dysphagia Hypoglycemia Core track feeding was held yesterday because of GI bleeding.  GI bleeding had improved . Noted in the last 24 hours, patient had several episodes of hypoglycemia and had to be started on dextrose drip. Since GI bleeding has improved, I started him back on tube feeding this morning.  Stop dextrose drip to avoid worsening of pre-existing hypervolemia Speech therapy following. Recent Labs  Lab 09/26/22 2132 09/27/22 0034 09/27/22 0120 09/27/22 0432 09/27/22 0757  GLUCAP 141* 66* 105* 60* 77     BPH Flomax  Impaired mobility PT evaluation obtained.  SNF recommended  Goals of care   Code Status: Full Code.  Poor prognosis.  DSS involved but no legal guardian yet. 1/19, called and updated [facility] for guardianship [name], [phone]).  I asked for advanced directives.  She was going to reach out to her supervisor and get back to me.  I have not heard any thing from her since then. Given his poor clinical status and poor prognosis, he may need to physicians determination for appropriation of CODE STATUS.  I believe he should be  DNR/DNI at least.  If clinical status does not improve, may be hospice candidate. 1/24, I have reconsulted palliative care.  Scheduled Meds:  feeding supplement (PROSource TF20)  60 mL Per Tube Daily   free water  200 mL Per Tube Q4H   furosemide  40 mg Intravenous Daily   lidocaine (PF)  15 mL Intradermal Once   metoprolol tartrate  25 mg Per Tube BID   pantoprazole (PROTONIX) IV  40 mg Intravenous Q24H   tamsulosin  0.4 mg Oral Daily    PRN meds: [DISCONTINUED] acetaminophen **OR** acetaminophen (TYLENOL) oral liquid 160 mg/5 mL **OR** acetaminophen, dextrose, ipratropium-albuterol, senna-docusate   Infusions:   feeding supplement (JEVITY 1.5 CAL/FIBER) 1,000 mL (09/25/22 1410)    Skin assessment:  Pressure Injury 09/24/22 Heel Left;Posterior Unstageable - Full thickness tissue loss  in which the base of the injury is covered by slough (yellow, tan, gray, green or brown) and/or eschar (tan, brown or black) in the wound bed. Round, dark brown spot (Active)  09/24/22 0903  Location: Heel  Location Orientation: Left;Posterior  Staging: Unstageable - Full thickness tissue loss in which the base of the injury is covered by slough (yellow, tan, gray, green or brown) and/or eschar (tan, brown or black) in the wound bed.  Wound Description (Comments): Round, dark brown spot  Present on Admission:     Nutritional status:  Body mass index is 27.17 kg/m.  Nutrition Problem: Moderate Malnutrition Etiology: chronic illness Signs/Symptoms: moderate fat depletion, severe muscle depletion     Diet:  Diet Order             Diet NPO time specified  Diet effective now                   DVT prophylaxis:  SCD's Start: 09/10/22 1605   Antimicrobials: None currently Fluid: None currently Consultants: Palliative care, IR, palliative care. Family Communication: None at bedside.    Status is: Inpatient  Continue in-hospital care because:  On core track feeding. Ascites is  already accumulating.  Not medically stable for discharge Level of care: Telemetry Medical   Dispo: The patient is from: [facility] long-term care              Anticipated d/c is to: Poor prognosis.   Antimicrobials: Anti-infectives (From admission, onward)    Start     Dose/Rate Route Frequency Ordered Stop   09/10/22 1345  cefTRIAXone (ROCEPHIN) 1 g in sodium chloride 0.9 % 100 mL IVPB        1 g 200 mL/hr over 30 Minutes Intravenous  Once 09/10/22 1331 09/10/22 1605   09/10/22 1345  azithromycin (ZITHROMAX) 500 mg in sodium chloride 0.9 % 250 mL IVPB        500 mg 250 mL/hr over 60 Minutes Intravenous  Once 09/10/22 1331 09/10/22 1605       Objective: Vitals:   09/27/22 0425 09/27/22 0751  BP: 131/66 135/63  Pulse: 78 65  Resp: 18 17  Temp: 98.5 F (36.9 C) 98.2 F (36.8 C)  SpO2: 99% 99%    Intake/Output Summary (Last 24 hours) at 09/27/2022 1103 Last data filed at 09/27/2022 0426 Gross per 24 hour  Intake --  Output 400 ml  Net -400 ml   Filed Weights   09/23/22 0016 09/25/22 0500 09/26/22 0623  Weight: 79.2 kg 79 kg 78.7 kg   Weight change:  Body mass index is 27.17 kg/m.   Physical Exam: General exam: Middle-aged African-American male.  Not in pain Skin: No rashes, lesions or ulcers. HEENT: Atraumatic, normocephalic, no obvious bleeding.  Core track feeding ongoing Lungs: Diminished air entry in both bases.  Moaning but not requiring supplemental oxygen CVS: Regular rate and rhythm, no murmur GI/Abd: Abdomen listed and worsening again.  Nontender on touch. CNS: Alert, awake, not oriented.  Has expressive aphasia.  Able to follow motor commands Psychiatry: Sad affect Extremities: 2+ bilateral pedal edema, no calf tenderness  Data Review: I have personally reviewed the laboratory data and studies available.  F/u labs ordered Unresulted Labs (From admission, onward)     Start     Ordered   09/26/22 0500  Basic metabolic panel  Daily,   R      Question:  Specimen collection method  Answer:  Lab=Lab collect   09/25/22  4034            Total time spent in review of labs and imaging, patient evaluation, formulation of plan, documentation and communication with family: 45 minutes  Signed, Lorin Glass, MD [facility] 09/27/2022

## 2022-09-27 NOTE — Progress Notes (Addendum)
Jacob Bass Gastroenterology Progress Note  CC:  Hematemesis. Cirrhosis.   Subjective:  No further sign of bleeding per patient's nurse.  Hgb stable at 9.1 grams today.  Objective:  Vital signs in last 24 hours: Temp:  [97.7 F (36.5 C)-99.5 F (37.5 C)] 98.4 F (36.9 C) (01/25 1222) Pulse Rate:  [57-88] 57 (01/25 1222) Resp:  [16-20] 20 (01/25 1222) BP: (111-154)/(63-98) 149/72 (01/25 1222) SpO2:  [99 %-100 %] 100 % (01/25 1222) Last BM Date : 09/25/22 General: Alert, chronically ill-appearing, in NAD; unable to communicate well Heart:  Regular rate and rhythm; no murmurs Pulm:  CTAB.  No W/R/R. Abdomen:  Firm and distended.  Non-tender.   Extremities:  Legs are in pressure boots.  Intake/Output from previous day: 01/24 0701 - 01/25 0700 In: -  Out: 1050 [Urine:1050]  Lab Results: Recent Labs    09/26/22 0326 09/27/22 0638  WBC 10.6* 8.6  HGB 8.9* 9.1*  HCT 27.2* 27.4*  PLT 101* 106*   BMET Recent Labs    09/25/22 1020 09/26/22 0326 09/27/22 0638  NA 142 143 144  K 3.9 4.2 3.9  CL 110 111 112*  CO2 23 24 25   GLUCOSE 98 109* 87  BUN 72* 71* 69*  CREATININE 1.86* 1.81* 1.77*  CALCIUM 8.0* 7.9* 8.2*   IR Paracentesis  Result Date: 09/25/2022 INDICATION: Cirrhosis with recurrent ascites. Request for therapeutic paracentesis. EXAM: ULTRASOUND GUIDED RIGHT LOWER QUADRANT PARACENTESIS MEDICATIONS: 1% plain lidocaine, 5 mL COMPLICATIONS: None immediate. PROCEDURE: Informed written consent was obtained from the patient after a discussion of the risks, benefits and alternatives to treatment. A timeout was performed prior to the initiation of the procedure. Initial ultrasound scanning demonstrates a large amount of ascites within the right lower abdominal quadrant. The right lower abdomen was prepped and draped in the usual sterile fashion. 1% lidocaine was used for local anesthesia. Following this, a 19 gauge, 7-cm, Yueh catheter was introduced. An ultrasound image  was saved for documentation purposes. The paracentesis was performed. The catheter was removed and a dressing was applied. The patient tolerated the procedure well without immediate post procedural complication. FINDINGS: A total of approximately 3.4 L of clear amber fluid was removed. IMPRESSION: Successful ultrasound-guided paracentesis yielding 3.4 liters of peritoneal fluid. Read by: Ascencion Dike PA-C PLAN: If the patient eventually requires >/=2 paracenteses in a 30 day period, candidacy for formal evaluation by the [facility] will be assessed. Electronically Signed   By: Jerilynn Mages.  Shick M.D.   On: 09/25/2022 16:56    Assessment / Plan: GI bleed.  Appears to have possible upper and lower as well as nasal sources/epistaxis.  Hematemesis preceded by epistaxis.  Red bloody stool reported.  Had nonbleeding gastric ulcer on EGD 4 years ago.  No previous colonoscopy.  Outpatient med list includes Protonix 40 mg bid, and continued on IV Protonix this admission.  Had a brown stool seen by Dr. Silverio Decamp yesterday.  No further GI bleeding per his nurse.   Chronic anemia.  Hgb stable at 9.1 grams today, which is stable.   HCV, EtOH cirrhosis.  MELD 20, MELD-Na 19 at admission.   Acute CVA.  Ongoing severe dysphagia and aphasia.  Previous CVA.   Possible HE with ammonia 99 at admission, 29 today.  Lactulose in place.   Ascites.  3 medium to large volume paracentesis during this admission and appears to have recurrent significant ascites by exam today.  Fluid has not been sent for  rule out SBP.  In past was presumed to have had SBP and treated with prophylactic ciprofloxacin PTA meds included 40 mg Lasix daily, no spironolactone other diuretics.   Dysphagia.  Nutrition via core track feeding tube.   Thrombocytopenia with platelet count of 106.    Coagulopathy.     MELD 3.0: 20 at 09/11/2022  5:08 PM MELD-Na: 19 at 09/11/2022  5:08 PM Calculated from: Serum  Creatinine: 1.77 mg/dL at 09/11/2022  5:08 PM Serum Sodium: 142 mmol/L (Using max of 137 mmol/L) at 09/11/2022  5:08 PM Total Bilirubin: 1.8 mg/dL at 09/10/2022 10:00 AM Serum Albumin: 1.7 g/dL at 09/10/2022 10:00 AM INR(ratio): 1.5 at 09/10/2022 10:09 AM Age at listing (hypothetical): 21 years Sex: Male at 09/11/2022  5:08 PM   Bleeding issues thought to be diffuse mucosal oozing in the setting of coagulopathy and thrombocytopenia.  No further sign of bleeding and hemoglobin is stable.  Patient is not consentable and has no POA.  Continue current care for now.  No plans for endoscopic evaluation.  GI signing off.   LOS: 16 days   Laban Emperor. Zehr  09/27/2022, 1:27 PM     Attending physician's note   I have taken a history, reviewed the chart and examined the patient. I performed a substantive portion of this encounter, including complete performance of at least one of the key components, in conjunction with the APP. I agree with the APP's note, impression and recommendations.    No further sign of bleeding.  Hemoglobin is stable No plan for endoscopic evaluation or intervention  Decompensated cirrhosis with ascites, MELD 20 Prognosis poor  GI will sign off, please call with any questions     K. Denzil Magnuson , MD [phone]

## 2022-09-28 DIAGNOSIS — I639 Cerebral infarction, unspecified: Secondary | ICD-10-CM | POA: Diagnosis not present

## 2022-09-28 LAB — CBC
HCT: 27.1 % — ABNORMAL LOW (ref 39.0–52.0)
Hemoglobin: 9 g/dL — ABNORMAL LOW (ref 13.0–17.0)
MCH: 34.7 pg — ABNORMAL HIGH (ref 26.0–34.0)
MCHC: 33.2 g/dL (ref 30.0–36.0)
MCV: 104.6 fL — ABNORMAL HIGH (ref 80.0–100.0)
Platelets: 117 10*3/uL — ABNORMAL LOW (ref 150–400)
RBC: 2.59 MIL/uL — ABNORMAL LOW (ref 4.22–5.81)
RDW: 21.5 % — ABNORMAL HIGH (ref 11.5–15.5)
WBC: 9.3 10*3/uL (ref 4.0–10.5)
nRBC: 0 % (ref 0.0–0.2)

## 2022-09-28 LAB — GLUCOSE, CAPILLARY
Glucose-Capillary: 108 mg/dL — ABNORMAL HIGH (ref 70–99)
Glucose-Capillary: 99 mg/dL (ref 70–99)
Glucose-Capillary: 95 mg/dL (ref 70–99)
Glucose-Capillary: 96 mg/dL (ref 70–99)
Glucose-Capillary: 86 mg/dL (ref 70–99)

## 2022-09-28 LAB — BASIC METABOLIC PANEL
Anion gap: 8 (ref 5–15)
BUN: 65 mg/dL — ABNORMAL HIGH (ref 8–23)
CO2: 23 mmol/L (ref 22–32)
Calcium: 8.2 mg/dL — ABNORMAL LOW (ref 8.9–10.3)
Chloride: 112 mmol/L — ABNORMAL HIGH (ref 98–111)
Creatinine, Ser: 1.8 mg/dL — ABNORMAL HIGH (ref 0.61–1.24)
GFR, Estimated: 42 mL/min — ABNORMAL LOW (ref >60)
Glucose, Bld: 114 mg/dL — ABNORMAL HIGH (ref 70–99)
Potassium: 3.8 mmol/L (ref 3.5–5.1)
Sodium: 143 mmol/L (ref 135–145)

## 2022-09-28 MED ORDER — LOPERAMIDE HCL 2 MG PO CAPS
2.0000 mg | ORAL_CAPSULE | Freq: Three times a day (TID) | ORAL | Status: DC | PRN
  Filled 2022-09-28: qty 1

## 2022-09-28 MED ORDER — BANATROL TF EN LIQD
60.0000 mL | Freq: Two times a day (BID) | ENTERAL | Status: DC
  Administered 2022-09-28 – 2022-10-16 (×36): 60 mL
  Filled 2022-09-28 (×35): qty 60

## 2022-09-28 NOTE — Progress Notes (Signed)
Speech Language Pathology Treatment: Dysphagia  Patient Details Name: Jacob Bass MRN: [id number] DOB: Mar 27, 1961 Today's Date: 09/28/2022 Time: 2979-8921 SLP Time Calculation (min) (ACUTE ONLY): 23 min  Assessment / Plan / Recommendation Clinical Impression  Pt participated in dysphagia therapy with trials of vanilla pudding and honey thick liquids. Consistently receptive to spoon and cup sips opening mouth. His delayed initiation to form bolus and transit improved over the course of the session with use of tactile and verbal cues using hand tapping and cues for  "one two three four swallow" to provide a rhythmic feedback for faster initiation. He consumed close to 3/4 container pudding and several sips honey thick (assisted in holding cup). Mild oral residue on left side and on lips throughout. Towards of session, unlike yesterday he did have 2 instances of delayed cough and wheeze, slower swallows possibly from fatigue with suspected airway intrusion. Overall, his volume and oral phase with ST has improved this week and recommend ST continue po trials with repeating his MBS next week for possible recommendation of po (purees at best).    HPI HPI: Jacob Bass is a 62 y.o. male who presented to ED from [facility] via EMS with left sided weakness, speech changes, wheezing. PMH/PSH includes prior CVA, subarachnoid hemorrhage, cirrhosis, hep C, hx of etOH abuse, dementia, COPD.  Pt known to SLP from prior admissions, with baseline dysarthria and oral dysphagia. He was most recently seen by SLP on 04/13/22 with recommendations for Dysphagia 1 solids and thin liquids. NPO with Cortrak, can't get PEG due to acities.      SLP Plan  Continue with current plan of care      Recommendations for follow up therapy are one component of a multi-disciplinary discharge planning process, led by the attending physician.  Recommendations may be updated based on patient status, additional functional criteria and  insurance authorization.    Recommendations  Diet recommendations: NPO Medication Administration: Via alternative means                Oral Care Recommendations: Oral care QID Follow Up Recommendations: Skilled nursing-short term rehab (<3 hours/day) Assistance recommended at discharge: Frequent or constant Supervision/Assistance SLP Visit Diagnosis: Dysphagia, oropharyngeal phase (R13.12) Plan: Continue with current plan of care           Houston Siren  09/28/2022, 10:49 AM

## 2022-09-28 NOTE — Plan of Care (Signed)
  Problem: Education: Goal: Knowledge of disease or condition will improve Outcome: Not Progressing Goal: Knowledge of secondary prevention will improve (MUST DOCUMENT ALL) Outcome: Not Progressing Goal: Knowledge of patient specific risk factors will improve (Mark N/A or DELETE if not current risk factor) Outcome: Not Progressing   Problem: Ischemic Stroke/TIA Tissue Perfusion: Goal: Complications of ischemic stroke/TIA will be minimized Outcome: Not Progressing   Problem: Coping: Goal: Will verbalize positive feelings about self Outcome: Not Progressing Goal: Will identify appropriate support needs Outcome: Not Progressing   Problem: Health Behavior/Discharge Planning: Goal: Ability to manage health-related needs will improve Outcome: Not Progressing Goal: Goals will be collaboratively established with patient/family Outcome: Not Progressing   Problem: Self-Care: Goal: Ability to participate in self-care as condition permits will improve Outcome: Not Progressing Goal: Verbalization of feelings and concerns over difficulty with self-care will improve Outcome: Not Progressing Goal: Ability to communicate needs accurately will improve Outcome: Not Progressing   Problem: Nutrition: Goal: Risk of aspiration will decrease Outcome: Not Progressing Goal: Dietary intake will improve Outcome: Not Progressing   Problem: Education: Goal: Knowledge of General Education information will improve Description: Including pain rating scale, medication(s)/side effects and non-pharmacologic comfort measures Outcome: Not Progressing   Problem: Health Behavior/Discharge Planning: Goal: Ability to manage health-related needs will improve Outcome: Not Progressing   Problem: Clinical Measurements: Goal: Ability to maintain clinical measurements within normal limits will improve Outcome: Not Progressing Goal: Will remain free from infection Outcome: Not Progressing Goal: Diagnostic test  results will improve Outcome: Not Progressing Goal: Respiratory complications will improve Outcome: Not Progressing Goal: Cardiovascular complication will be avoided Outcome: Not Progressing   Problem: Activity: Goal: Risk for activity intolerance will decrease Outcome: Not Progressing   Problem: Nutrition: Goal: Adequate nutrition will be maintained Outcome: Not Progressing   Problem: Coping: Goal: Level of anxiety will decrease Outcome: Not Progressing   Problem: Elimination: Goal: Will not experience complications related to bowel motility Outcome: Not Progressing Goal: Will not experience complications related to urinary retention Outcome: Not Progressing   Problem: Pain Managment: Goal: General experience of comfort will improve Outcome: Not Progressing   Problem: Safety: Goal: Ability to remain free from injury will improve Outcome: Not Progressing   Problem: Skin Integrity: Goal: Risk for impaired skin integrity will decrease Outcome: Not Progressing   

## 2022-09-28 NOTE — Progress Notes (Signed)
Physical Therapy Treatment Patient Details Name: Jacob Bass MRN: [id number] DOB: 1960/09/16 Today's Date: 09/28/2022   History of Present Illness 62 y.o. male presents to [facility] hospital on 09/10/2022 with AMS and L weakness from [facility]. 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 Complicated by fluid overload. Paracentesis performed 1/14 and 1/16. PMH includes CVA, COPD, cirrhosis, gastric ulcer.    PT Comments    Pt received in bed. Max assist rolling R/L. Unable to tolerate EOB due to re-accumulating ascites, pain, and fatigue. Pt assisted with repositioning in bed in semi-chair position. Light PROM LUE. Pt remained in bed at end of session.    Recommendations for follow up therapy are one component of a multi-disciplinary discharge planning process, led by the attending physician.  Recommendations may be updated based on patient status, additional functional criteria and insurance authorization.  Follow Up Recommendations  Skilled nursing-short term rehab (<3 hours/day) Can patient physically be transported by private vehicle: No   Assistance Recommended at Discharge Frequent or constant Supervision/Assistance  Patient can return home with the following Two people to help with walking and/or transfers;Two people to help with bathing/dressing/bathroom;Assistance with cooking/housework;Assistance with feeding;Direct supervision/assist for medications management;Direct supervision/assist for financial management;Assist for transportation;Help with stairs or ramp for entrance   Equipment Recommendations  Wheelchair (measurements PT);Wheelchair cushion (measurements PT);Hospital bed;Other (comment) (hoyer lift)    Recommendations for Other Services       Precautions / Restrictions Precautions Precautions: Fall;Other (comment) Precaution Comments: cortrak     Mobility  Bed Mobility Overal bed mobility: Needs Assistance   Rolling: Max assist         General bed mobility  comments: max assist rolling R/L. Unable to tolerate EOB due to ascites, abdominal distention    Transfers                        Ambulation/Gait                   Stairs             Wheelchair Mobility    Modified Rankin (Stroke Patients Only)       Balance                                            Cognition Arousal/Alertness: Awake/alert Behavior During Therapy: Flat affect Overall Cognitive Status: No family/caregiver present to determine baseline cognitive functioning                                 General Comments: Following simple commands with increased time. No vocalizations or attempts at communicated with therapist. Appears fatigued, disinterested in participation in therapy.        Exercises Other Exercises Other Exercises: light PROM LUE    General Comments        Pertinent Vitals/Pain Pain Assessment Pain Assessment: Faces Faces Pain Scale: Hurts little more Pain Location: generalized with mobility Pain Descriptors / Indicators: Discomfort, Grimacing Pain Intervention(s): Monitored during session, Limited activity within patient's tolerance, Repositioned    Home Living                          Prior Function            PT Goals (current goals  can now be found in the care plan section) Acute Rehab PT Goals Patient Stated Goal: not stated Progress towards PT goals: Not progressing toward goals - comment (pain, fatigue)    Frequency    Min 2X/week      PT Plan Current plan remains appropriate    Co-evaluation              AM-PAC PT "6 Clicks" Mobility   Outcome Measure  Help needed turning from your back to your side while in a flat bed without using bedrails?: Total Help needed moving from lying on your back to sitting on the side of a flat bed without using bedrails?: Total Help needed moving to and from a bed to a chair (including a wheelchair)?:  Total Help needed standing up from a chair using your arms (e.g., wheelchair or bedside chair)?: Total Help needed to walk in hospital room?: Total Help needed climbing 3-5 steps with a railing? : Total 6 Click Score: 6    End of Session   Activity Tolerance: Patient limited by fatigue;Patient limited by pain Patient left: in bed;with call bell/phone within reach;with bed alarm set Nurse Communication: Mobility status PT Visit Diagnosis: Other abnormalities of gait and mobility (R26.89);Muscle weakness (generalized) (M62.81);Hemiplegia and hemiparesis Hemiplegia - Right/Left: Left Hemiplegia - caused by: Cerebral infarction     Time: 9528-4132 PT Time Calculation (min) (ACUTE ONLY): 14 min  Charges:  $Therapeutic Activity: 8-22 mins                     Aida Raider, PT  Office # [phone] Pager [phone]    Ilda Foil 09/28/2022, 12:25 PM

## 2022-09-28 NOTE — Progress Notes (Signed)
PROGRESS NOTE  Jacob Bass  DOB: Jul 02, 1961  PCP: Donia Ast., MD RUE:[id number]  DOA: 09/10/2022  LOS: 17 days  Hospital Day: 19  Brief narrative: Jacob Bass is a 63 y.o. male with PMH significant for history of stroke with residual left-sided deficits, HTN, liver cirrhosis, chronic anemia, COPD, recent diagnosis of COVID who lives at [facility]. 1/8, patient was sent to the ED for altered mental status and weakness.  Last known normal the previous night.  In the ED, patient was afebrile, hemodynamically stable Labs with creatinine elevated to 2.09, ammonia level elevated to 99 CT head unremarkable MRI brain positive for small acute infarct in the left coronary data CTA of head and neck showed focal stenosis at the right V1 and evidence suspicious for pseudoaneurysm Also showed moderate focal stenosis of the right P1 left P2.  Admitted to [facility] Neurology consulted for acute stroke  His hospital course has been complicated by recurrent ascites and dysphagia.  He has required paracentesis x 4.  Dysphagia is not improving and he is not a candidate for PEG tube placement due to recurrent ascites.  Currently has core track feeding. Palliative care consulted.  Legal guardianship is in process. Patient remains full code  Subjective: Patient was seen and examined this morning.   Lying on bed.  Grunting.  On low-flow supplemental oxygen.  Tube feeding continues. No further bleeding episodes.  Hemoglobin stable.  Assessment and plan: Acute GI bleeding in the setting of liver cirrhosis Acute blood loss anemia. 1/24, patient had an episode of hematochezia, hematemesis.  He feeding was stopped.  GI consultation was obtained.  Patient was started on IV Protonix. Seems to have stabilized.  Hemoglobin stable.  No EGD at this time per GI. Tube feeding has been resumed again. Hemoglobin remains stable Recent Labs    09/10/22 1115 09/10/22 1611 09/11/22 1708 09/23/22 0335  09/24/22 0328 09/26/22 0326 09/27/22 0638 09/28/22 0756  HGB  --   --    < > 8.2* 8.7* 8.9* 9.1* 9.0*  MCV  --   --    < > 106.2* 109.0* 105.4* 104.2* 104.6*  VITAMINB12  --  2,050*  --   --   --   --   --   --   FOLATE >40.0  --   --   --   --   --   --   --    < > = values in this interval not displayed.   Liver cirrhosis with anasarca Hypoalbuminemia Essential hypertension Per history, patient has worsening edema since October He has significant anasarca. Unable to consent but because of medical medical necessity, paracentesis has been done 4 times.  Last done on 1/23 when 3.4 L were removed. Ascites gradually accumulating again.   Currently also on metoprolol and IV Lasix. Clinically he has bilateral pedal edema 1-2+ and fast recommending ascites.  Remains on IV Lasix. Continue Protonix Recent Labs  Lab 09/22/22 1100 09/23/22 0335 09/24/22 0328 09/26/22 0326 09/27/22 0638 09/28/22 0756  AST 81*  --  80*  --   --   --   ALT 52*  --  51*  --   --   --   ALKPHOS 118  --  102  --   --   --   BILITOT 1.0  --  1.2  --   --   --   PROT 6.6  --  6.6  --   --   --   ALBUMIN 1.8*  --  2.2*  --   --   --   PLT 96* 94* 67* 101* 106* 117*   Acute CVA prior CVA with left-sided deficits Brought to the ED after patient was noted to be less interactive compared to her baseline.   MRI showed left acute coronary due to small infarct  Stroke workup completed.  Neurology consult appreciated Echo showed EF of 55 to 60% with no cardiac source of embolism A1c 4.5, LDL 74 Because of active bleeding, patient is currently not on any antithrombotic or anticoagulant. Statin not started because of underlying liver cirrhosis Current deficit include expressive aphasia and dysphagia.  Acute medical encephalopathy Hepatic encephalopathy Ammonia level was elevated to 99.  His altered mentation was probably partly due to hyperammonemia Currently getting lactic acid through tube feeding.  Ammonia  level improving, trend as below. Mental status poor and stable.  Acute kidney injury Baseline creatinine around 1.  Presented with creatinine of 2.08, peaked at 2.12.  Fluctuating.  1.8 this morning.  Continue IV Lasix.   Recent Labs    09/19/22 0639 09/20/22 0405 09/21/22 0604 09/22/22 1100 09/23/22 0335 09/24/22 0328 09/25/22 1020 09/26/22 0326 09/27/22 0638 09/28/22 0756  BUN 39* 50* 58* 61* 65* 66* 72* 71* 69* 65*  CREATININE 1.91* 2.12* 2.00* 1.87* 1.86* 1.66* 1.86* 1.81* 1.77* 1.80*   COPD Continue bronchodilators  Dysphagia Hypoglycemia 1/24, because of GI bleeding, core track feeding.  GI bleeding stopped.  Tube feeding has now been resumed.  Blood sugar level stable. Recent Labs  Lab 09/27/22 1642 09/27/22 2026 09/28/22 0402 09/28/22 0827 09/28/22 1145  GLUCAP 92 86 108* 99 95   BPH Flomax  Impaired mobility PT evaluation obtained.  SNF recommended  Goals of care   Code Status: Full Code.  Poor prognosis.  DSS involved but no legal guardian yet. 1/19, called and updated [facility] for guardianship [name], [phone]).  I asked for advanced directives.  She was going to reach out to her supervisor and get back to me.  I have not heard any thing from her since then. Given his poor clinical status and poor prognosis, he may need to physicians determination for appropriation of CODE STATUS.  I believe he should be DNR/DNI at least.  If clinical status does not improve, may be hospice candidate. 1/24, I have reconsulted palliative care.  Scheduled Meds:  feeding supplement (PROSource TF20)  60 mL Per Tube Daily   fiber supplement (BANATROL TF)  60 mL Per Tube BID   free water  200 mL Per Tube Q4H   furosemide  40 mg Intravenous Daily   lidocaine (PF)  15 mL Intradermal Once   metoprolol tartrate  25 mg Per Tube BID   pantoprazole (PROTONIX) IV  40 mg Intravenous Q24H   tamsulosin  0.4 mg Oral Daily    PRN meds: [DISCONTINUED]  acetaminophen **OR** acetaminophen (TYLENOL) oral liquid 160 mg/5 mL **OR** acetaminophen, dextrose, ipratropium-albuterol, senna-docusate   Infusions:   feeding supplement (JEVITY 1.5 CAL/FIBER) 1,000 mL (09/28/22 1304)    Skin assessment:  Pressure Injury 09/24/22 Heel Left;Posterior Unstageable - Full thickness tissue loss in which the base of the injury is covered by slough (yellow, tan, gray, green or brown) and/or eschar (tan, brown or black) in the wound bed. Round, dark brown spot (Active)  09/24/22 0903  Location: Heel  Location Orientation: Left;Posterior  Staging: Unstageable - Full thickness tissue loss in which the base of the injury is covered by slough (yellow, tan, gray,  green or brown) and/or eschar (tan, brown or black) in the wound bed.  Wound Description (Comments): Round, dark brown spot  Present on Admission:     Nutritional status:  Body mass index is 23.58 kg/m.  Nutrition Problem: Moderate Malnutrition Etiology: chronic illness Signs/Symptoms: moderate fat depletion, severe muscle depletion     Diet:  Diet Order             Diet NPO time specified  Diet effective now                   DVT prophylaxis:  SCD's Start: 09/10/22 1605   Antimicrobials: None currently Fluid: None currently Consultants: Palliative care, IR, GI Family Communication: None at bedside.    Status is: Inpatient  Continue in-hospital care because:  On core track feeding. Ascites is already accumulating.  Not medically stable for discharge Level of care: Telemetry Medical   Dispo: The patient is from: [facility]term care              Anticipated d/c is to: Poor prognosis.   Antimicrobials: Anti-infectives (From admission, onward)    Start     Dose/Rate Route Frequency Ordered Stop   09/10/22 1345  cefTRIAXone (ROCEPHIN) 1 g in sodium chloride 0.9 % 100 mL IVPB        1 g 200 mL/hr over 30 Minutes Intravenous  Once 09/10/22 1331 09/10/22 1605   09/10/22  1345  azithromycin (ZITHROMAX) 500 mg in sodium chloride 0.9 % 250 mL IVPB        500 mg 250 mL/hr over 60 Minutes Intravenous  Once 09/10/22 1331 09/10/22 1605       Objective: Vitals:   09/28/22 0733 09/28/22 1209  BP: (!) 123/98 (!) 148/79  Pulse: 78 79  Resp: 18 18  Temp: 98.5 F (36.9 C) 98 F (36.7 C)  SpO2: 100% 99%    Intake/Output Summary (Last 24 hours) at 09/28/2022 1510 Last data filed at 09/28/2022 1255 Gross per 24 hour  Intake --  Output 2300 ml  Net -2300 ml   Filed Weights   09/25/22 0500 09/26/22 0623 09/28/22 0500  Weight: 79 kg 78.7 kg 68.3 kg   Weight change:  Body mass index is 23.58 kg/m.   Physical Exam: General exam: Middle-aged African-American male.  Not in pain Skin: No rashes, lesions or ulcers. HEENT: Atraumatic, normocephalic, no obvious bleeding.  Core track feeding ongoing Lungs: Diminished air entry in both bases.  Moaning but not requiring supplemental oxygen CVS: Regular rate and rhythm, no murmur GI/Abd: Abdomen listed and worsening again.  Nontender to touch CNS: Alert, awake, not oriented.  Has expressive aphasia.  Able to follow motor commands Psychiatry: Sad affect Extremities: 2+ bilateral pedal edema, no calf tenderness  Data Review: I have personally reviewed the laboratory data and studies available.  F/u labs ordered Unresulted Labs (From admission, onward)     Start     Ordered   09/29/22 0500  CBC with Differential/Platelet  Tomorrow morning,   R       Question:  Specimen collection method  Answer:  Lab=Lab collect   09/28/22 0811   09/29/22 0500  Basic metabolic panel  Tomorrow morning,   R       Question:  Specimen collection method  Answer:  Lab=Lab collect   09/28/22 0811            Total time spent in review of labs and imaging, patient evaluation, formulation of plan, documentation  and communication with family: 45 minutes  Signed, Lorin Glass, MD [facility] 09/28/2022

## 2022-09-29 ENCOUNTER — Inpatient Hospital Stay (HOSPITAL_COMMUNITY): Payer: Medicaid Other

## 2022-09-29 DIAGNOSIS — I639 Cerebral infarction, unspecified: Secondary | ICD-10-CM | POA: Diagnosis not present

## 2022-09-29 LAB — BASIC METABOLIC PANEL
Anion gap: 4 — ABNORMAL LOW (ref 5–15)
BUN: 60 mg/dL — ABNORMAL HIGH (ref 8–23)
CO2: 25 mmol/L (ref 22–32)
Calcium: 7.8 mg/dL — ABNORMAL LOW (ref 8.9–10.3)
Chloride: 117 mmol/L — ABNORMAL HIGH (ref 98–111)
Creatinine, Ser: 1.68 mg/dL — ABNORMAL HIGH (ref 0.61–1.24)
GFR, Estimated: 45 mL/min — ABNORMAL LOW (ref >60)
Glucose, Bld: 117 mg/dL — ABNORMAL HIGH (ref 70–99)
Potassium: 4.1 mmol/L (ref 3.5–5.1)
Sodium: 146 mmol/L — ABNORMAL HIGH (ref 135–145)

## 2022-09-29 LAB — GLUCOSE, CAPILLARY
Glucose-Capillary: 104 mg/dL — ABNORMAL HIGH (ref 70–99)
Glucose-Capillary: 106 mg/dL — ABNORMAL HIGH (ref 70–99)
Glucose-Capillary: 110 mg/dL — ABNORMAL HIGH (ref 70–99)
Glucose-Capillary: 117 mg/dL — ABNORMAL HIGH (ref 70–99)
Glucose-Capillary: 118 mg/dL — ABNORMAL HIGH (ref 70–99)
Glucose-Capillary: 83 mg/dL (ref 70–99)
Glucose-Capillary: 92 mg/dL (ref 70–99)

## 2022-09-29 LAB — CBC WITH DIFFERENTIAL/PLATELET
Abs Immature Granulocytes: 0.1 10*3/uL — ABNORMAL HIGH (ref 0.00–0.07)
Basophils Absolute: 0.1 10*3/uL (ref 0.0–0.1)
Basophils Relative: 1 %
Eosinophils Absolute: 0 10*3/uL (ref 0.0–0.5)
Eosinophils Relative: 0 %
HCT: 25.9 % — ABNORMAL LOW (ref 39.0–52.0)
Hemoglobin: 8.6 g/dL — ABNORMAL LOW (ref 13.0–17.0)
Lymphocytes Relative: 11 %
Lymphs Abs: 1 10*3/uL (ref 0.7–4.0)
MCH: 35.4 pg — ABNORMAL HIGH (ref 26.0–34.0)
MCHC: 33.2 g/dL (ref 30.0–36.0)
MCV: 106.6 fL — ABNORMAL HIGH (ref 80.0–100.0)
Metamyelocytes Relative: 1 %
Monocytes Absolute: 0.5 10*3/uL (ref 0.1–1.0)
Monocytes Relative: 5 %
Neutro Abs: 7.5 10*3/uL (ref 1.7–7.7)
Neutrophils Relative %: 82 %
Platelets: 112 10*3/uL — ABNORMAL LOW (ref 150–400)
RBC: 2.43 MIL/uL — ABNORMAL LOW (ref 4.22–5.81)
RDW: 21.4 % — ABNORMAL HIGH (ref 11.5–15.5)
WBC: 9.2 10*3/uL (ref 4.0–10.5)
nRBC: 0 /100 WBC
nRBC: 0.2 % (ref 0.0–0.2)

## 2022-09-29 MED ORDER — LIDOCAINE HCL (PF) 1 % IJ SOLN: 5.0000 mL | Freq: Once | INTRAMUSCULAR | Status: DC

## 2022-09-29 MED ORDER — LIDOCAINE HCL (PF) 1 % IJ SOLN
INTRAMUSCULAR | Status: AC
  Filled 2022-09-29: qty 30

## 2022-09-29 MED ORDER — LOPERAMIDE HCL 1 MG/7.5ML PO SUSP
2.0000 mg | Freq: Three times a day (TID) | ORAL | Status: DC | PRN
  Administered 2022-09-29 – 2022-09-30 (×3): 2 mg via ORAL
  Filled 2022-09-29 (×4): qty 15

## 2022-09-29 NOTE — Progress Notes (Deleted)
Patient refused CPAP.

## 2022-09-29 NOTE — Progress Notes (Signed)
PROGRESS NOTE  Jacob Bass  DOB: December 03, 1960  PCP: Donia Ast., MD ZOX:[id number]  DOA: 09/10/2022  LOS: 18 days  Hospital Day: 20  Brief narrative: Jacob Bass is a 62 y.o. male with PMH significant for history of stroke with residual left-sided deficits, HTN, liver cirrhosis, chronic anemia, COPD, recent diagnosis of COVID who lives at [facility]. 1/8, patient was sent to the ED for altered mental status and weakness.  Last known normal the previous night.  In the ED, patient was afebrile, hemodynamically stable Labs with creatinine elevated to 2.09, ammonia level elevated to 99 CT head unremarkable MRI brain positive for small acute infarct in the left coronary data CTA of head and neck showed focal stenosis at the right V1 and evidence suspicious for pseudoaneurysm Also showed moderate focal stenosis of the right P1 left P2.  Admitted to [facility] Neurology consulted for acute stroke  His hospital course has been complicated by recurrent ascites and dysphagia.  He has required paracentesis x 4.  Dysphagia is not improving and he is not a candidate for PEG tube placement due to recurrent ascites.  Currently has core track feeding. Palliative care consulted.  Legal guardianship is in process. Patient remains full code  Subjective: Patient was seen and examined this morning.   Lying down in bed.  Alert, awake, mumbles.  Not grunting today. Tube feeding ongoing.  Diarrhea improving. Ascites worsening  Assessment and plan: Acute GI bleeding in the setting of liver cirrhosis Acute blood loss anemia. 1/24, patient had an episode of hematochezia, hematemesis.  He feeding was stopped.  GI consultation was obtained.  Patient was started on IV Protonix. GI bleeding seems to have stabilized.  Hemoglobin roughly stable.  No EGD at this time per GI. Continue tube feeding Hemoglobin remains stable Recent Labs    09/10/22 1115 09/10/22 1611 09/11/22 1708 09/24/22 0328 09/26/22 0326  09/27/22 0638 09/28/22 0756 09/29/22 0346  HGB  --   --    < > 8.7* 8.9* 9.1* 9.0* 8.6*  MCV  --   --    < > 109.0* 105.4* 104.2* 104.6* 106.6*  VITAMINB12  --  2,050*  --   --   --   --   --   --   FOLATE >40.0  --   --   --   --   --   --   --    < > = values in this interval not displayed.   Liver cirrhosis with anasarca Hypoalbuminemia Essential hypertension Per history, patient has worsening edema since October He has significant anasarca. Unable to consent but because of medical medical necessity, paracentesis has been done 4 times.  Last done on 1/23 when 3.4 L were removed. Ascites gradually accumulating again.  I will order for another paracentesis today. Currently also on metoprolol and IV Lasix. Clinically he has bilateral pedal edema 1-2+ and fast recommending ascites.  Remains on IV Lasix. Continue Protonix Recent Labs  Lab 09/22/22 1100 09/23/22 0335 09/24/22 0328 09/26/22 0326 09/27/22 0638 09/28/22 0756 09/29/22 0346  AST 81*  --  80*  --   --   --   --   ALT 52*  --  51*  --   --   --   --   ALKPHOS 118  --  102  --   --   --   --   BILITOT 1.0  --  1.2  --   --   --   --  PROT 6.6  --  6.6  --   --   --   --   ALBUMIN 1.8*  --  2.2*  --   --   --   --   PLT 96*   < > 67* 101* 106* 117* 112*   < > = values in this interval not displayed.   Acute CVA prior CVA with left-sided deficits Brought to the ED after patient was noted to be less interactive compared to her baseline.   MRI showed left acute coronary due to small infarct  Stroke workup completed.  Neurology consult appreciated Echo showed EF of 55 to 60% with no cardiac source of embolism A1c 4.5, LDL 74 Because of active bleeding, patient is currently not on any antithrombotic or anticoagulant. Statin not started because of underlying liver cirrhosis Current deficit include expressive aphasia and dysphagia.  Acute medical encephalopathy Hepatic encephalopathy Ammonia level was elevated to 99.   His altered mentation was probably partly due to hyperammonemia Currently getting lactic acid through tube feeding.  Ammonia level improving, trend as below. Mental status poor and stable.  Acute kidney injury Baseline creatinine around 1.  Presented with creatinine of 2.08, peaked at 2.12.  Fluctuating.  1.68 this morning.  Continue IV Lasix.   Recent Labs    09/20/22 0405 09/21/22 0604 09/22/22 1100 09/23/22 0335 09/24/22 0328 09/25/22 1020 09/26/22 0326 09/27/22 0638 09/28/22 0756 09/29/22 0346  BUN 50* 58* 61* 65* 66* 72* 71* 69* 65* 60*  CREATININE 2.12* 2.00* 1.87* 1.86* 1.66* 1.86* 1.81* 1.77* 1.80* 1.68*   COPD Continue bronchodilators  Dysphagia Hypoglycemia 1/24, because of GI bleeding, core track feeding.  GI bleeding stopped.  Tube feeding has now been resumed.  Blood sugar level stable. Recent Labs  Lab 09/28/22 1608 09/28/22 2046 09/29/22 0010 09/29/22 0404 09/29/22 0802  GLUCAP 96 86 92 117* 118*   BPH Flomax  Impaired mobility PT evaluation obtained.  SNF recommended  Goals of care   Code Status: Full Code.  Poor prognosis.  DSS involved but no legal guardian yet. 1/19, called and updated [facility] for guardianship [name], [phone]).  I asked for advanced directives.  She was going to reach out to her supervisor and get back to me.  I have not heard any thing from her since then. Given his poor clinical status and poor prognosis, he may need to physicians determination for appropriation of CODE STATUS.  I believe he should be DNR/DNI at least.  If clinical status does not improve, may be hospice candidate. 1/24, I have reconsulted palliative care.  Scheduled Meds:  feeding supplement (PROSource TF20)  60 mL Per Tube Daily   fiber supplement (BANATROL TF)  60 mL Per Tube BID   free water  200 mL Per Tube Q4H   furosemide  40 mg Intravenous Daily   lidocaine (PF)  15 mL Intradermal Once   metoprolol tartrate  25 mg Per  Tube BID   pantoprazole (PROTONIX) IV  40 mg Intravenous Q24H   tamsulosin  0.4 mg Oral Daily    PRN meds: [DISCONTINUED] acetaminophen **OR** acetaminophen (TYLENOL) oral liquid 160 mg/5 mL **OR** acetaminophen, dextrose, ipratropium-albuterol, loperamide HCl, senna-docusate   Infusions:   feeding supplement (JEVITY 1.5 CAL/FIBER) 1,000 mL (09/28/22 1304)    Skin assessment:  Pressure Injury 09/24/22 Heel Left;Posterior Unstageable - Full thickness tissue loss in which the base of the injury is covered by slough (yellow, tan, gray, green or brown) and/or eschar (tan,  brown or black) in the wound bed. Round, dark brown spot (Active)  09/24/22 0903  Location: Heel  Location Orientation: Left;Posterior  Staging: Unstageable - Full thickness tissue loss in which the base of the injury is covered by slough (yellow, tan, gray, green or brown) and/or eschar (tan, brown or black) in the wound bed.  Wound Description (Comments): Round, dark brown spot  Present on Admission:     Nutritional status:  Body mass index is 24.03 kg/m.  Nutrition Problem: Moderate Malnutrition Etiology: chronic illness Signs/Symptoms: moderate fat depletion, severe muscle depletion     Diet:  Diet Order             Diet NPO time specified  Diet effective now                   DVT prophylaxis:  SCD's Start: 09/10/22 1605   Antimicrobials: None currently Fluid: None currently Consultants: Palliative care, IR Family Communication: None at bedside.    Status is: Inpatient  Continue in-hospital care because:  On core track feeding. Ascites is already accumulating.  Ordered for repeat paracentesis today.  Not medically stable for discharge Level of care: Telemetry Medical   Dispo: The patient is from: [facility] long-term care              Anticipated d/c is to: Poor prognosis.   Antimicrobials: Anti-infectives (From admission, onward)    Start     Dose/Rate Route Frequency Ordered Stop    09/10/22 1345  cefTRIAXone (ROCEPHIN) 1 g in sodium chloride 0.9 % 100 mL IVPB        1 g 200 mL/hr over 30 Minutes Intravenous  Once 09/10/22 1331 09/10/22 1605   09/10/22 1345  azithromycin (ZITHROMAX) 500 mg in sodium chloride 0.9 % 250 mL IVPB        500 mg 250 mL/hr over 60 Minutes Intravenous  Once 09/10/22 1331 09/10/22 1605       Objective: Vitals:   09/29/22 0403 09/29/22 0803  BP: 135/72 119/65  Pulse: 75 80  Resp: 14 18  Temp: 98.5 F (36.9 C) 97.9 F (36.6 C)  SpO2: 100% 98%    Intake/Output Summary (Last 24 hours) at 09/29/2022 1044 Last data filed at 09/29/2022 0532 Gross per 24 hour  Intake --  Output 1400 ml  Net -1400 ml   Filed Weights   09/26/22 0623 09/28/22 0500 09/29/22 0500  Weight: 78.7 kg 68.3 kg 69.6 kg   Weight change: 1.3 kg Body mass index is 24.03 kg/m.   Physical Exam: General exam: Middle-aged African-American male.  Not in pain. Skin: No rashes, lesions or ulcers. HEENT: Atraumatic, normocephalic, no obvious bleeding.  Core track feeding ongoing Lungs: Diminished air entry in both bases.  Moaning but not requiring supplemental oxygen CVS: Regular rate and rhythm, no murmur GI/Abd: Abdomen listed and worsening again. Nontender to touch CNS: Alert, awake, not oriented.  Has expressive aphasia.  Able to follow motor commands Psychiatry: Sad affect Extremities: 2+ bilateral pedal edema, no calf tenderness  Data Review: I have personally reviewed the laboratory data and studies available.  F/u labs ordered Unresulted Labs (From admission, onward)    None       Total time spent in review of labs and imaging, patient evaluation, formulation of plan, documentation and communication with family: 45 minutes  Signed, Lorin Glass, MD [facility] 09/29/2022

## 2022-09-29 NOTE — Plan of Care (Signed)
  Problem: Education: Goal: Knowledge of secondary prevention will improve (MUST DOCUMENT ALL) Outcome: Progressing   Problem: Ischemic Stroke/TIA Tissue Perfusion: Goal: Complications of ischemic stroke/TIA will be minimized Outcome: Progressing   Problem: Coping: Goal: Will identify appropriate support needs Outcome: Progressing   Problem: Self-Care: Goal: Ability to participate in self-care as condition permits will improve Outcome: Progressing Goal: Verbalization of feelings and concerns over difficulty with self-care will improve Outcome: Progressing Goal: Ability to communicate needs accurately will improve Outcome: Progressing   Problem: Nutrition: Goal: Risk of aspiration will decrease Outcome: Progressing Goal: Dietary intake will improve Outcome: Progressing   Problem: Safety: Goal: Ability to remain free from injury will improve Outcome: Progressing   Problem: Skin Integrity: Goal: Risk for impaired skin integrity will decrease Outcome: Progressing

## 2022-09-29 NOTE — Plan of Care (Signed)

## 2022-09-29 NOTE — Progress Notes (Signed)
Pt off unit @ 12pm. Pt returned from IR procedure

## 2022-09-29 NOTE — Procedures (Signed)
Patient with ongoing rapid reaccumulation of ascites with resulting shortness of breath/wheezing.  Consistent with prior paracentesis since admission, recommendation from team to proceed with emergent consent given stroke with aphasia and no consenting party.    PROCEDURE SUMMARY:  Successful US guided paracentesis from left lateral abdomen.  Yielded 4.2 liters of blood-tinged fluid.  No immediate complications.  Pt tolerated well.   Specimen was not sent for labs.  EBL < 65mL  Docia Barrier PA-C 09/29/2022 12:49 PM

## 2022-09-30 DIAGNOSIS — I639 Cerebral infarction, unspecified: Secondary | ICD-10-CM | POA: Diagnosis not present

## 2022-09-30 LAB — GLUCOSE, CAPILLARY
Glucose-Capillary: 102 mg/dL — ABNORMAL HIGH (ref 70–99)
Glucose-Capillary: 107 mg/dL — ABNORMAL HIGH (ref 70–99)
Glucose-Capillary: 110 mg/dL — ABNORMAL HIGH (ref 70–99)
Glucose-Capillary: 113 mg/dL — ABNORMAL HIGH (ref 70–99)
Glucose-Capillary: 95 mg/dL (ref 70–99)
Glucose-Capillary: 98 mg/dL (ref 70–99)

## 2022-09-30 MED ORDER — GERHARDT'S BUTT CREAM
TOPICAL_CREAM | Freq: Four times a day (QID) | CUTANEOUS | Status: DC | PRN
  Filled 2022-09-30: qty 1

## 2022-09-30 NOTE — Plan of Care (Signed)
  Problem: Ischemic Stroke/TIA Tissue Perfusion: Goal: Complications of ischemic stroke/TIA will be minimized Outcome: Progressing   Problem: Self-Care: Goal: Ability to participate in self-care as condition permits will improve Outcome: Progressing   Problem: Nutrition: Goal: Risk of aspiration will decrease Outcome: Progressing   Problem: Elimination: Goal: Will not experience complications related to bowel motility Outcome: Progressing   Problem: Skin Integrity: Goal: Risk for impaired skin integrity will decrease Outcome: Progressing

## 2022-09-30 NOTE — Plan of Care (Signed)
  Problem: Education: Goal: Knowledge of disease or condition will improve Outcome: Not Progressing Goal: Knowledge of secondary prevention will improve (MUST DOCUMENT ALL) Outcome: Not Progressing Goal: Knowledge of patient specific risk factors will improve (Mark N/A or DELETE if not current risk factor) Outcome: Not Progressing   Problem: Ischemic Stroke/TIA Tissue Perfusion: Goal: Complications of ischemic stroke/TIA will be minimized Outcome: Not Progressing   Problem: Coping: Goal: Will verbalize positive feelings about self Outcome: Not Progressing Goal: Will identify appropriate support needs Outcome: Not Progressing   Problem: Health Behavior/Discharge Planning: Goal: Ability to manage health-related needs will improve Outcome: Not Progressing Goal: Goals will be collaboratively established with patient/family Outcome: Not Progressing   Problem: Self-Care: Goal: Ability to participate in self-care as condition permits will improve Outcome: Not Progressing Goal: Verbalization of feelings and concerns over difficulty with self-care will improve Outcome: Not Progressing Goal: Ability to communicate needs accurately will improve Outcome: Not Progressing   Problem: Nutrition: Goal: Risk of aspiration will decrease Outcome: Not Progressing Goal: Dietary intake will improve Outcome: Not Progressing   Problem: Education: Goal: Knowledge of General Education information will improve Description: Including pain rating scale, medication(s)/side effects and non-pharmacologic comfort measures Outcome: Not Progressing   Problem: Health Behavior/Discharge Planning: Goal: Ability to manage health-related needs will improve Outcome: Not Progressing   Problem: Clinical Measurements: Goal: Ability to maintain clinical measurements within normal limits will improve Outcome: Not Progressing Goal: Will remain free from infection Outcome: Not Progressing Goal: Diagnostic test  results will improve Outcome: Not Progressing Goal: Respiratory complications will improve Outcome: Not Progressing Goal: Cardiovascular complication will be avoided Outcome: Not Progressing   Problem: Activity: Goal: Risk for activity intolerance will decrease Outcome: Not Progressing   Problem: Nutrition: Goal: Adequate nutrition will be maintained Outcome: Not Progressing   Problem: Coping: Goal: Level of anxiety will decrease Outcome: Not Progressing   Problem: Elimination: Goal: Will not experience complications related to bowel motility Outcome: Not Progressing Goal: Will not experience complications related to urinary retention Outcome: Not Progressing   Problem: Pain Managment: Goal: General experience of comfort will improve Outcome: Not Progressing   Problem: Safety: Goal: Ability to remain free from injury will improve Outcome: Not Progressing   Problem: Skin Integrity: Goal: Risk for impaired skin integrity will decrease Outcome: Not Progressing   

## 2022-09-30 NOTE — Progress Notes (Signed)
PROGRESS NOTE  Jacob Bass  DOB: 07-18-1961  PCP: Donia Ast., MD NFA:[id number]  DOA: 09/10/2022  LOS: 19 days  Hospital Day: 21  Brief narrative: Jacob Bass is a 62 y.o. male with PMH significant for history of stroke with residual left-sided deficits, HTN, liver cirrhosis, chronic anemia, COPD, recent diagnosis of COVID who lives at [facility]. 1/8, patient was sent to the ED for altered mental status and weakness.  Last known normal the previous night.  In the ED, patient was afebrile, hemodynamically stable Labs with creatinine elevated to 2.09, ammonia level elevated to 99 CT head unremarkable MRI brain positive for small acute infarct in the left coronary data CTA of head and neck showed focal stenosis at the right V1 and evidence suspicious for pseudoaneurysm Also showed moderate focal stenosis of the right P1 left P2.  Admitted to [facility] Neurology consulted for acute stroke  His hospital course has been complicated by recurrent ascites and dysphagia.  He has required paracentesis x 4.  Dysphagia is not improving and he is not a candidate for PEG tube placement due to recurrent ascites.  Currently has core track feeding. Palliative care consulted.  Legal guardianship is in process. Patient remains full code  Subjective: Patient was seen and examined this morning.   Lying down in bed.  Not in distress.  No new symptoms.  Underwent paracentesis yesterday.  Assessment and plan: Acute GI bleeding in the setting of liver cirrhosis Acute blood loss anemia. 1/24, patient had an episode of hematochezia, hematemesis.  He feeding was stopped.  GI consultation was obtained.  Patient was started on IV Protonix. GI bleeding seems to have stabilized.  Hemoglobin roughly stable.  No EGD at this time per GI. Continue tube feeding Hemoglobin remains stable Recent Labs    09/10/22 1115 09/10/22 1611 09/11/22 1708 09/24/22 0328 09/26/22 0326 09/27/22 0638 09/28/22 0756  09/29/22 0346  HGB  --   --    < > 8.7* 8.9* 9.1* 9.0* 8.6*  MCV  --   --    < > 109.0* 105.4* 104.2* 104.6* 106.6*  VITAMINB12  --  2,050*  --   --   --   --   --   --   FOLATE >40.0  --   --   --   --   --   --   --    < > = values in this interval not displayed.    Liver cirrhosis with anasarca Hypoalbuminemia Essential hypertension Per history, patient has worsening edema since October He has significant anasarca. Unable to consent but because of medical medical necessity, paracentesis has been done 5 times.  Last done on 1/28 when 4.2 L of bloody fluid was removed. Ascites seems to be reaccumulating again. Currently also on IV Protonix, IV Lasix and oral metoprolol.  Recent Labs  Lab 09/24/22 0328 09/26/22 0326 09/27/22 0638 09/28/22 0756 09/29/22 0346  AST 80*  --   --   --   --   ALT 51*  --   --   --   --   ALKPHOS 102  --   --   --   --   BILITOT 1.2  --   --   --   --   PROT 6.6  --   --   --   --   ALBUMIN 2.2*  --   --   --   --   PLT 67* 101* 106* 117* 112*  Acute CVA prior CVA with left-sided deficits Brought to the ED after patient was noted to be less interactive compared to her baseline.   MRI showed left acute coronary due to small infarct  Stroke workup completed.  Neurology consult appreciated Echo showed EF of 55 to 60% with no cardiac source of embolism A1c 4.5, LDL 74 Because of active bleeding, patient is currently not on any antithrombotic or anticoagulant. Statin not started because of underlying liver cirrhosis Current deficit include expressive aphasia and dysphagia.  Acute medical encephalopathy Hepatic encephalopathy Ammonia level was elevated to 99.  His altered mentation was probably partly due to hyperammonemia Currently getting lactic acid through tube feeding.  Ammonia level improving, trend as below. Mental status poor and stable.  Acute kidney injury Baseline creatinine around 1.  Presented with creatinine of 2.08, peaked at  2.12.  Fluctuating.  1.68 on last check on 1/27.  Continue IV Lasix.   Recent Labs    09/20/22 0405 09/21/22 0604 09/22/22 1100 09/23/22 0335 09/24/22 0328 09/25/22 1020 09/26/22 0326 09/27/22 0638 09/28/22 0756 09/29/22 0346  BUN 50* 58* 61* 65* 66* 72* 71* 69* 65* 60*  CREATININE 2.12* 2.00* 1.87* 1.86* 1.66* 1.86* 1.81* 1.77* 1.80* 1.68*    COPD Continue bronchodilators  Dysphagia Hypoglycemia 1/24, because of GI bleeding, core track feeding.  GI bleeding stopped.  Tube feeding has now been resumed.  Blood sugar level stable. Recent Labs  Lab 09/29/22 2021 09/29/22 2346 09/30/22 0421 09/30/22 0808 09/30/22 1135  GLUCAP 106* 104* 113* 102* 95    BPH Flomax  Impaired mobility PT evaluation obtained.  SNF recommended  Goals of care   Code Status: Full Code.  Poor prognosis.  DSS involved but no legal guardian yet. 1/19, called and updated [facility] for guardianship Jacob Bass, [phone]).  I asked for advanced directives.  She was going to reach out to her supervisor and get back to me.  I have not heard any thing from her since then. Given his poor clinical status and poor prognosis, he may need to physicians determination for appropriation of CODE STATUS.  I believe he should be DNR/DNI at least.  If clinical status does not improve, may be hospice candidate. 1/24, I have reconsulted palliative care.  Scheduled Meds:  feeding supplement (PROSource TF20)  60 mL Per Tube Daily   fiber supplement (BANATROL TF)  60 mL Per Tube BID   free water  200 mL Per Tube Q4H   furosemide  40 mg Intravenous Daily   lidocaine (PF)  15 mL Intradermal Once   lidocaine (PF)  5 mL Intradermal Once   metoprolol tartrate  25 mg Per Tube BID   pantoprazole (PROTONIX) IV  40 mg Intravenous Q24H   tamsulosin  0.4 mg Oral Daily    PRN meds: [DISCONTINUED] acetaminophen **OR** acetaminophen (TYLENOL) oral liquid 160 mg/5 mL **OR** acetaminophen, dextrose,  ipratropium-albuterol, loperamide HCl, senna-docusate   Infusions:   feeding supplement (JEVITY 1.5 CAL/FIBER) 1,000 mL (09/30/22 1111)    Skin assessment:  Pressure Injury 09/24/22 Heel Left;Posterior Unstageable - Full thickness tissue loss in which the base of the injury is covered by slough (yellow, tan, gray, green or brown) and/or eschar (tan, brown or black) in the wound bed. Round, dark brown spot (Active)  09/24/22 0903  Location: Heel  Location Orientation: Left;Posterior  Staging: Unstageable - Full thickness tissue loss in which the base of the injury is covered by slough (yellow, tan, gray, green or brown) and/or  eschar (tan, brown or black) in the wound bed.  Wound Description (Comments): Round, dark brown spot  Present on Admission:     Nutritional status:  Body mass index is 21.72 kg/m.  Nutrition Problem: Moderate Malnutrition Etiology: chronic illness Signs/Symptoms: moderate fat depletion, severe muscle depletion     Diet:  Diet Order             Diet NPO time specified  Diet effective now                   DVT prophylaxis:  SCD's Start: 09/10/22 1605   Antimicrobials: None currently Fluid: None currently Consultants: Palliative care, IR Family Communication: None at bedside.    Status is: Inpatient  Continue in-hospital care because:  On core track feeding. Ascites is already accumulating again. Not medically stable for discharge Level of care: Telemetry Medical   Dispo: The patient is from: [facility] long-term care              Anticipated d/c is to: Poor prognosis.   Antimicrobials: Anti-infectives (From admission, onward)    Start     Dose/Rate Route Frequency Ordered Stop   09/10/22 1345  cefTRIAXone (ROCEPHIN) 1 g in sodium chloride 0.9 % 100 mL IVPB        1 g 200 mL/hr over 30 Minutes Intravenous  Once 09/10/22 1331 09/10/22 1605   09/10/22 1345  azithromycin (ZITHROMAX) 500 mg in sodium chloride 0.9 % 250 mL IVPB         500 mg 250 mL/hr over 60 Minutes Intravenous  Once 09/10/22 1331 09/10/22 1605       Objective: Vitals:   09/30/22 0421 09/30/22 0718  BP: (!) 145/75 (!) 98/57  Pulse: 81 79  Resp: 16 15  Temp: 98.5 F (36.9 C) 98.3 F (36.8 C)  SpO2: 96% 100%    Intake/Output Summary (Last 24 hours) at 09/30/2022 1408 Last data filed at 09/30/2022 0506 Gross per 24 hour  Intake --  Output 750 ml  Net -750 ml    Filed Weights   09/28/22 0500 09/29/22 0500 09/30/22 0500  Weight: 68.3 kg 69.6 kg 62.9 kg   Weight change: -6.7 kg Body mass index is 21.72 kg/m.   Physical Exam: General exam: Middle-aged African-American male.  Not in pain. Skin: No rashes, lesions or ulcers. HEENT: Atraumatic, normocephalic, no obvious bleeding.  Core track feeding ongoing Lungs: Diminished air entry in both bases.  Moaning but not requiring supplemental oxygen CVS: Regular rate and rhythm, no murmur GI/Abd: Abdomen listed and worsening again. Nontender to touch CNS: Alert, awake, not oriented.  Has expressive aphasia.  Able to follow motor commands Psychiatry: Sad affect Extremities: 2+ bilateral pedal edema, no calf tenderness  Data Review: I have personally reviewed the laboratory data and studies available.  F/u labs ordered Unresulted Labs (From admission, onward)     Start     Ordered   10/01/22 0500  Basic metabolic panel  Tomorrow morning,   R       Question:  Specimen collection method  Answer:  Lab=Lab collect   09/30/22 0830   10/01/22 0500  CBC with Differential/Platelet  Tomorrow morning,   R       Question:  Specimen collection method  Answer:  Lab=Lab collect   09/30/22 0830   10/01/22 0500  Ammonia  Tomorrow morning,   R       Question:  Specimen collection method  Answer:  Lab=Lab collect  09/30/22 0830            Total time spent in review of labs and imaging, patient evaluation, formulation of plan, documentation and communication with family: 45  minutes  Signed, Lorin Glass, MD [facility] 09/30/2022

## 2022-10-01 DIAGNOSIS — R4701 Aphasia: Secondary | ICD-10-CM

## 2022-10-01 DIAGNOSIS — R1310 Dysphagia, unspecified: Secondary | ICD-10-CM

## 2022-10-01 DIAGNOSIS — Z7189 Other specified counseling: Secondary | ICD-10-CM | POA: Diagnosis not present

## 2022-10-01 DIAGNOSIS — I639 Cerebral infarction, unspecified: Secondary | ICD-10-CM | POA: Diagnosis not present

## 2022-10-01 LAB — GLUCOSE, CAPILLARY
Glucose-Capillary: 117 mg/dL — ABNORMAL HIGH (ref 70–99)
Glucose-Capillary: 83 mg/dL (ref 70–99)
Glucose-Capillary: 104 mg/dL — ABNORMAL HIGH (ref 70–99)
Glucose-Capillary: 106 mg/dL — ABNORMAL HIGH (ref 70–99)
Glucose-Capillary: 91 mg/dL (ref 70–99)

## 2022-10-01 LAB — CBC WITH DIFFERENTIAL/PLATELET
Abs Immature Granulocytes: 0.09 10*3/uL — ABNORMAL HIGH (ref 0.00–0.07)
Basophils Absolute: 0.1 10*3/uL (ref 0.0–0.1)
Basophils Relative: 0 %
Eosinophils Absolute: 0.1 10*3/uL (ref 0.0–0.5)
Eosinophils Relative: 0 %
HCT: 31.9 % — ABNORMAL LOW (ref 39.0–52.0)
Hemoglobin: 10.1 g/dL — ABNORMAL LOW (ref 13.0–17.0)
Immature Granulocytes: 1 %
Lymphocytes Relative: 9 %
Lymphs Abs: 1.3 10*3/uL (ref 0.7–4.0)
MCH: 34.1 pg — ABNORMAL HIGH (ref 26.0–34.0)
MCHC: 31.7 g/dL (ref 30.0–36.0)
MCV: 107.8 fL — ABNORMAL HIGH (ref 80.0–100.0)
Monocytes Absolute: 1.4 10*3/uL — ABNORMAL HIGH (ref 0.1–1.0)
Monocytes Relative: 10 %
Neutro Abs: 12 10*3/uL — ABNORMAL HIGH (ref 1.7–7.7)
Neutrophils Relative %: 80 %
Platelets: 117 10*3/uL — ABNORMAL LOW (ref 150–400)
RBC: 2.96 MIL/uL — ABNORMAL LOW (ref 4.22–5.81)
RDW: 21.3 % — ABNORMAL HIGH (ref 11.5–15.5)
WBC: 14.9 10*3/uL — ABNORMAL HIGH (ref 4.0–10.5)
nRBC: 0 % (ref 0.0–0.2)

## 2022-10-01 LAB — BASIC METABOLIC PANEL
Anion gap: 8 (ref 5–15)
BUN: 59 mg/dL — ABNORMAL HIGH (ref 8–23)
CO2: 23 mmol/L (ref 22–32)
Calcium: 7.8 mg/dL — ABNORMAL LOW (ref 8.9–10.3)
Chloride: 114 mmol/L — ABNORMAL HIGH (ref 98–111)
Creatinine, Ser: 1.84 mg/dL — ABNORMAL HIGH (ref 0.61–1.24)
GFR, Estimated: 41 mL/min — ABNORMAL LOW (ref >60)
Glucose, Bld: 119 mg/dL — ABNORMAL HIGH (ref 70–99)
Potassium: 4.3 mmol/L (ref 3.5–5.1)
Sodium: 145 mmol/L (ref 135–145)

## 2022-10-01 LAB — AMMONIA: Ammonia: 133 umol/L — ABNORMAL HIGH (ref 9–35)

## 2022-10-01 MED ORDER — LACTULOSE 10 GM/15ML PO SOLN: 10.0000 g | Freq: Two times a day (BID) | ORAL | Status: DC

## 2022-10-01 MED ORDER — LACTULOSE 10 GM/15ML PO SOLN
10.0000 g | Freq: Two times a day (BID) | ORAL | Status: DC
  Administered 2022-10-01 – 2022-10-17 (×31): 10 g
  Filled 2022-10-01 (×32): qty 30

## 2022-10-01 MED ORDER — ONDANSETRON HCL 4 MG/2ML IJ SOLN: 4.0000 mg | Freq: Four times a day (QID) | INTRAMUSCULAR | Status: DC | PRN

## 2022-10-01 NOTE — Progress Notes (Signed)
Patient ID: Jacob Bass, male   DOB: Jan 04, 1961, 62 y.o.   MRN: [id number]    Progress Note from the Palliative Medicine Team at [facility]   Patient Name: Jacob Bass        Date: 10/01/2022 DOB: 14-Sep-1960  Age: 62 y.o. MRN#: [id number] Attending Physician: Terrilee Croak, MD Primary Care Physician: Tyrone Schimke., MD Admit Date: 09/10/2022   Medical records reviewed, assessed patient at bedside  Patient remains with core track for nutritional support secondary to dysphagia.  Currently he is aphasic and inconsistently follows simple commands.  Jacob Bass is a 62 y.o. male who presented to ED from [facility] via EMS with left sided weakness, speech changes, wheezing. PMH/PSH includes prior CVA, subarachnoid hemorrhage, cirrhosis, hep C, hx of etOH abuse, dementia, COPD. Pt known to SLP from prior admissions, with baseline dysarthria and oral dysphagia.   Admitted for treatment and stabilization  This NP discussed with attending current medical situation.  Dr. Pietro Cassis has concerns for patient remaining full code secondary to his high risk for decompensation and arrest.  I was able to reach out DSS and spoke directly to [name]).  She tells me that petition for guardianship is occurring today and that she will be back in touch within the next few days.  I expressed my appreciation and hope to meet as soon as possible for discussion and consideration for the next steps in this patient's treatment plan.  Patient is high risk for continued decompensation.  He is unable to support himself from a nutritional standpoint, he is high risk for aspiration; he is overall failing to thrive.  Today is day 21 of this hospitalization.   I worry for his comfort and dignity  Again education offered today regarding  the importance of continued conversation with   medical providers regarding overall plan of care and treatment options,  ensuring decisions are within the context of the patients  values and GOCs.  Questions and concerns addressed   Discussed with Dr Pietro Cassis  50 minutes   Wadie Lessen NP  Palliative Medicine Team Team Phone # [phone] Pager [phone]

## 2022-10-01 NOTE — Progress Notes (Signed)
Speech Language Pathology Treatment: Dysphagia  Patient Details Name: Jacob Bass MRN: [id number] DOB: 1961/07/21 Today's Date: 10/01/2022 Time: 4098-1191 SLP Time Calculation (min) (ACUTE ONLY): 15 min  Assessment / Plan / Recommendation Clinical Impression  Pt seen for dysphagia therapy and has was seen Tues-Friday last week with plan to repeat his MBS tomorrow for any improvements in swallow and ability and possibility to start on a least restrictive diet. Today he was given 1 sip of honey thick juice and 1/2 container of applesauce. Oral transit delays were less as session continued and he did not have significant oral residue (just on lips) which is a slow improvement compared to several weeks ago. He did have episodes of eructation today and suspect some esophageal involvement. Continues to have multiple swallows perhaps increased today. Will continue ST with MBS tomorrow.    HPI HPI: Jacob Bass is a 62 y.o. male who presented to ED from [facility] via EMS with left sided weakness, speech changes, wheezing. PMH/PSH includes prior CVA, subarachnoid hemorrhage, cirrhosis, hep C, hx of etOH abuse, dementia, COPD.  Pt known to SLP from prior admissions, with baseline dysarthria and oral dysphagia. He was most recently seen by SLP on 04/13/22 with recommendations for Dysphagia 1 solids and thin liquids. NPO with Cortrak, can't get PEG due to acities.      SLP Plan  MBS (1/30 MBS)      Recommendations for follow up therapy are one component of a multi-disciplinary discharge planning process, led by the attending physician.  Recommendations may be updated based on patient status, additional functional criteria and insurance authorization.    Recommendations  Diet recommendations: NPO Medication Administration: Via alternative means                Oral Care Recommendations: Oral care QID Follow Up Recommendations: Skilled nursing-short term rehab (<3 hours/day) Assistance recommended  at discharge: Frequent or constant Supervision/Assistance SLP Visit Diagnosis: Dysphagia, oropharyngeal phase (R13.12) Plan: MBS (1/30 MBS)           Houston Siren  10/01/2022, 11:57 AM

## 2022-10-01 NOTE — Progress Notes (Signed)
PROGRESS NOTE  Jacob Bass  DOB: 20-Jan-1961  PCP: Donia Ast., MD VZD:[id number]  DOA: 09/10/2022  LOS: 20 days  Hospital Day: 22  Brief narrative: Jacob Bass is a 62 y.o. male with PMH significant for history of stroke with residual left-sided deficits, HTN, liver cirrhosis, chronic anemia, COPD, recent diagnosis of COVID who lives at [facility]. 1/8, patient was sent to the ED for altered mental status and weakness.  Last known normal the previous night.  In the ED, patient was afebrile, hemodynamically stable Labs with creatinine elevated to 2.09, ammonia level elevated to 99 CT head unremarkable MRI brain positive for small acute infarct in the left coronary data CTA of head and neck showed focal stenosis at the right V1 and evidence suspicious for pseudoaneurysm Also showed moderate focal stenosis of the right P1 left P2.  Admitted to [facility] Neurology consulted for acute stroke  His hospital course has been complicated by recurrent ascites and dysphagia.  He has required paracentesis x 4.  Dysphagia is not improving and he is not a candidate for PEG tube placement due to recurrent ascites.  Currently has core track feeding. Palliative care consulted.  Legal guardianship is in process. Patient remains full code  Subjective: Patient was seen and examined this morning.   Opens eyes on verbal command.  Normal is only.  Has core track feeding ongoing. Ascites worsening.  Assessment and plan: Acute GI bleeding in the setting of liver cirrhosis Acute blood loss anemia. 1/24, patient had an episode of hematochezia, hematemesis.  He feeding was stopped.  GI consultation was obtained.  Patient was started on IV Protonix. GI bleeding seems to have stabilized.  Hemoglobin roughly stable.  No EGD at this time per GI. Continue tube feeding Hemoglobin remains stable Recent Labs    09/10/22 1115 09/10/22 1611 09/11/22 1708 09/26/22 0326 09/27/22 2951 09/28/22 0756  09/29/22 0346 10/01/22 0314  HGB  --   --    < > 8.9* 9.1* 9.0* 8.6* 10.1*  MCV  --   --    < > 105.4* 104.2* 104.6* 106.6* 107.8*  VITAMINB12  --  2,050*  --   --   --   --   --   --   FOLATE >40.0  --   --   --   --   --   --   --    < > = values in this interval not displayed.   Liver cirrhosis with anasarca Hypoalbuminemia Essential hypertension Per history, patient has worsening edema since October He has significant anasarca. Unable to consent but because of medical medical necessity, paracentesis has been done 5 times.  Last done on 1/28 when 4.2 L of bloody fluid was removed. Ascites seems to be reaccumulating again. Currently also on IV Protonix, IV Lasix and oral metoprolol.  Recent Labs  Lab 09/26/22 0326 09/27/22 8841 09/28/22 0756 09/29/22 0346 10/01/22 0314  AMMONIA  --   --   --   --  133*  PLT 101* 106* 117* 112* 117*   Acute CVA prior CVA with left-sided deficits Brought to the ED after patient was noted to be less interactive compared to her baseline.   MRI showed left acute coronary due to small infarct  Stroke workup completed. Neurology consult appreciated Echo showed EF of 55 to 60% with no cardiac source of embolism A1c 4.5, LDL 74 Because of active bleeding, patient is currently not on any antithrombotic or anticoagulant. Statin not started because of  underlying liver cirrhosis Current deficit include expressive aphasia and dysphagia.  Acute medical encephalopathy Hepatic encephalopathy Ammonia level was elevated to 99.  His altered mentation was probably partly due to hyperammonemia Currently getting lactic acid through tube feeding.  Ammonia level worsened today to 133.  I will start him on lactulose twice daily. Mental status poor and stable.  Acute kidney injury Baseline creatinine around 1.  Presented with creatinine of 2.08, peaked at 2.12.  Fluctuating.  1.68 on last check on 1/27.  Continue IV Lasix.   Recent Labs    09/21/22 0604  09/22/22 1100 09/23/22 0335 09/24/22 0328 09/25/22 1020 09/26/22 0326 09/27/22 3086 09/28/22 0756 09/29/22 0346 10/01/22 0314  BUN 58* 61* 65* 66* 72* 71* 69* 65* 60* 59*  CREATININE 2.00* 1.87* 1.86* 1.66* 1.86* 1.81* 1.77* 1.80* 1.68* 1.84*   COPD Continue bronchodilators  Dysphagia Hypoglycemia 1/24, because of GI bleeding, core track feeding.  GI bleeding stopped.  Tube feeding has now been resumed.  Blood sugar level stable. Recent Labs  Lab 09/30/22 1933 09/30/22 2350 10/01/22 0318 10/01/22 0843 10/01/22 1152  GLUCAP 107* 98 117* 83 104*   BPH Flomax  Impaired mobility PT evaluation obtained.  SNF recommended  Goals of care   Code Status: Full Code.  Poor prognosis.  DSS involved but no legal guardian yet. 1/19, called and updated [facility] for guardianship [name], [phone]).  I asked for advanced directives.  She was going to reach out to her supervisor and get back to me.  I have not heard any thing from her since then. 1/29, Perative care reached out to DSS again.  They were told that petition is being filed today for guardianship.   Ethics involved.  Remains full code as of now.  Scheduled Meds:  feeding supplement (PROSource TF20)  60 mL Per Tube Daily   fiber supplement (BANATROL TF)  60 mL Per Tube BID   free water  200 mL Per Tube Q4H   furosemide  40 mg Intravenous Daily   lactulose  10 g Per Tube BID   lidocaine (PF)  15 mL Intradermal Once   lidocaine (PF)  5 mL Intradermal Once   metoprolol tartrate  25 mg Per Tube BID   pantoprazole (PROTONIX) IV  40 mg Intravenous Q24H   tamsulosin  0.4 mg Oral Daily    PRN meds: [DISCONTINUED] acetaminophen **OR** acetaminophen (TYLENOL) oral liquid 160 mg/5 mL **OR** acetaminophen, dextrose, Gerhardt's butt cream, ipratropium-albuterol, senna-docusate   Infusions:   feeding supplement (JEVITY 1.5 CAL/FIBER) 1,000 mL (10/01/22 0557)    Skin assessment:  Pressure Injury  09/24/22 Heel Left;Posterior Unstageable - Full thickness tissue loss in which the base of the injury is covered by slough (yellow, tan, gray, green or brown) and/or eschar (tan, brown or black) in the wound bed. Round, dark brown spot (Active)  09/24/22 0903  Location: Heel  Location Orientation: Left;Posterior  Staging: Unstageable - Full thickness tissue loss in which the base of the injury is covered by slough (yellow, tan, gray, green or brown) and/or eschar (tan, brown or black) in the wound bed.  Wound Description (Comments): Round, dark brown spot  Present on Admission:     Nutritional status:  Body mass index is 22.27 kg/m.  Nutrition Problem: Moderate Malnutrition Etiology: chronic illness Signs/Symptoms: moderate fat depletion, severe muscle depletion     Diet:  Diet Order             Diet NPO time specified  Diet effective now                   DVT prophylaxis:  SCD's Start: 09/10/22 1605   Antimicrobials: None currently Fluid: None currently Consultants: Palliative care, IR Family Communication: None at bedside.    Status is: Inpatient  Continue in-hospital care because:  On core track feeding. Ascites is already accumulating again. Not medically stable for discharge Level of care: Telemetry Medical   Dispo: The patient is from: [facility] long-term care              Anticipated d/c is to: Poor prognosis.   Antimicrobials: Anti-infectives (From admission, onward)    Start     Dose/Rate Route Frequency Ordered Stop   09/10/22 1345  cefTRIAXone (ROCEPHIN) 1 g in sodium chloride 0.9 % 100 mL IVPB        1 g 200 mL/hr over 30 Minutes Intravenous  Once 09/10/22 1331 09/10/22 1605   09/10/22 1345  azithromycin (ZITHROMAX) 500 mg in sodium chloride 0.9 % 250 mL IVPB        500 mg 250 mL/hr over 60 Minutes Intravenous  Once 09/10/22 1331 09/10/22 1605       Objective: Vitals:   10/01/22 0827 10/01/22 1154  BP: 128/65 101/69  Pulse: 77 78   Resp: 18 18  Temp: 97.6 F (36.4 C) 97.6 F (36.4 C)  SpO2: 100% 97%    Intake/Output Summary (Last 24 hours) at 10/01/2022 1304 Last data filed at 10/01/2022 4098 Gross per 24 hour  Intake --  Output 800 ml  Net -800 ml   Filed Weights   09/29/22 0500 09/30/22 0500 10/01/22 0500  Weight: 69.6 kg 62.9 kg 64.5 kg   Weight change: 1.6 kg Body mass index is 22.27 kg/m.   Physical Exam: General exam: Middle-aged African-American male.  Not in pain. Skin: No rashes, lesions or ulcers. HEENT: Atraumatic, normocephalic, no obvious bleeding.  Core track feeding ongoing Lungs: Diminished air entry in both bases.  Moaning but not requiring supplemental oxygen CVS: Regular rate and rhythm, no murmur GI/Abd: Abdomen listed and worsening again. Nontender to touch CNS: More sleepy today.  Opens eyes on verbal command..  Has expressive aphasia.  Able to follow motor commands Psychiatry: Sad affect Extremities: 2+ bilateral pedal edema, no calf tenderness  Data Review: I have personally reviewed the laboratory data and studies available.  F/u labs ordered Unresulted Labs (From admission, onward)    None       Total time spent in review of labs and imaging, patient evaluation, formulation of plan, documentation and communication with family: 45 minutes  Signed, Lorin Glass, MD [facility] 10/01/2022

## 2022-10-02 ENCOUNTER — Inpatient Hospital Stay (HOSPITAL_COMMUNITY): Payer: Medicaid Other

## 2022-10-02 DIAGNOSIS — I639 Cerebral infarction, unspecified: Secondary | ICD-10-CM | POA: Diagnosis not present

## 2022-10-02 LAB — GLUCOSE, CAPILLARY
Glucose-Capillary: 76 mg/dL (ref 70–99)
Glucose-Capillary: 69 mg/dL — ABNORMAL LOW (ref 70–99)
Glucose-Capillary: 161 mg/dL — ABNORMAL HIGH (ref 70–99)
Glucose-Capillary: 97 mg/dL (ref 70–99)
Glucose-Capillary: 89 mg/dL (ref 70–99)
Glucose-Capillary: 63 mg/dL — ABNORMAL LOW (ref 70–99)
Glucose-Capillary: 80 mg/dL (ref 70–99)
Glucose-Capillary: 81 mg/dL (ref 70–99)

## 2022-10-02 LAB — URINALYSIS, COMPLETE (UACMP) WITH MICROSCOPIC
Bilirubin Urine: NEGATIVE
Glucose, UA: NEGATIVE mg/dL
Hgb urine dipstick: NEGATIVE
Ketones, ur: NEGATIVE mg/dL
Leukocytes,Ua: NEGATIVE
Nitrite: NEGATIVE
Protein, ur: NEGATIVE mg/dL
Specific Gravity, Urine: 1.015 (ref 1.005–1.030)
pH: 5 (ref 5.0–8.0)

## 2022-10-02 LAB — BLOOD GAS, VENOUS
Acid-base deficit: 1.7 mmol/L (ref 0.0–2.0)
Bicarbonate: 22.1 mmol/L (ref 20.0–28.0)
O2 Saturation: 86.6 %
Patient temperature: 36.9
pCO2, Ven: 34 mmHg — ABNORMAL LOW (ref 44–60)
pH, Ven: 7.42 (ref 7.25–7.43)
pO2, Ven: 56 mmHg — ABNORMAL HIGH (ref 32–45)

## 2022-10-02 LAB — PROCALCITONIN: Procalcitonin: 0.29 ng/mL

## 2022-10-02 MED ORDER — SODIUM CHLORIDE 0.9 % IV SOLN
1.0000 g | INTRAVENOUS | Status: AC
  Administered 2022-10-02 – 2022-10-08 (×7): 1 g via INTRAVENOUS
  Filled 2022-10-02 (×8): qty 10

## 2022-10-02 NOTE — Progress Notes (Signed)
PT Cancellation Note  Patient Details Name: Jacob Bass MRN: [id number] DOB: [birth year]   Cancelled Treatment:    Reason Eval/Treat Not Completed: Other (comment)  Attempted to see twice this afternoon, both times pt soiled and needs to be cleaned. Nursing made aware.    [place]  Office [phone]   [name] 10/02/2022, 3:54 PM

## 2022-10-02 NOTE — Progress Notes (Signed)
Hypoglycemic Event  CBG: 69  Treatment: D50 50 mL (25 gm)  Symptoms: None  Follow-up CBG: [id number]  CBG Result:161  Possible Reasons for Event: Inadequate meal intake  Comments/MD notified:No signs of distress    [place]  Twan Harkin

## 2022-10-02 NOTE — Plan of Care (Signed)
  Problem: Nutrition: Goal: Risk of aspiration will decrease Outcome: Progressing Goal: Dietary intake will improve Outcome: Progressing   

## 2022-10-02 NOTE — Progress Notes (Signed)
Modified Barium Swallow Progress Note  Patient Details  Name: Jacob Bass MRN: [id number] Date of Birth: Nov 22, 1960  Today's Date: 10/02/2022  Modified Barium Swallow completed.  Full report located under Chart Review in the Imaging Section.  Brief recommendations include the following:  Clinical Impression  Pt's oropharyangeal function has improved from previous study. He exhibits a primarily oral phase dysphagia with significant impairments. in lingual control, cohesion and strength which results in delayed transit and significant residue on lingual surface and base of tongue. Empty spoon presentations and cues to move tongue were mild-moderately effective. His base on tongue and pharyngeal contraction is weak leaving residue on base that he does reduce with spontaneous multiple swallows. Several occasions material reached the valleculae before swallow was initiated with puree. As the study progressed the timliness and strength of his swallow appeared to improve somewhat. Due to his oral delays the number of pharyngeal swallows was a bit limited. Fortunately he protected his airway and there was no penetration or aspiration present as well as only trace-minimum residue in his valleculae. Esophageal scan was unremarkable. It is recommended that he initiate a Dys 1 (puree), honey thick liquids, with feeding assistant to  give additional time to swallow and check for pocketing/anterior spillage. Position upright and limit distractions. Meds can be given crushed in puree or can use Cortrak.   Swallow Evaluation Recommendations       SLP Diet Recommendations: Dysphagia 1 (Puree) solids;Honey thick liquids   Liquid Administration via: Cup;Spoon   Medication Administration: Crushed with puree (or Cortrak)   Supervision: Full assist for feeding;Staff to assist with self feeding;Full supervision/cueing for compensatory strategies   Compensations: Slow rate;Small sips/bites;Lingual sweep for  clearance of pocketing;Monitor for anterior loss;Minimize environmental distractions;Follow solids with liquid   Postural Changes: Seated upright at 90 degrees   Oral Care Recommendations: Oral care BID        Houston Siren 10/02/2022,12:32 PM

## 2022-10-02 NOTE — Progress Notes (Signed)
PROGRESS NOTE  Jacob Bass  DOB: 1960/09/30  PCP: Donia Ast., MD WUX:[id number]  DOA: 09/10/2022  LOS: 21 days  Hospital Day: 23  Brief narrative: Jacob Bass is a 62 y.o. male with PMH significant for history of stroke with residual left-sided deficits, HTN, liver cirrhosis, chronic anemia, COPD, recent diagnosis of COVID who lives at [facility]. 1/8, patient was sent to the ED for altered mental status and weakness.  Last known normal the previous night.  In the ED, patient was afebrile, hemodynamically stable Labs with creatinine elevated to 2.09, ammonia level elevated to 99 CT head unremarkable MRI brain positive for small acute infarct in the left coronary data CTA of head and neck showed focal stenosis at the right V1 and evidence suspicious for pseudoaneurysm Also showed moderate focal stenosis of the right P1 left P2.  Admitted to [facility] Neurology consulted for acute stroke  His hospital course has been complicated by recurrent ascites and dysphagia.  He has required paracentesis x 4.  Dysphagia is not improving and he is not a candidate for PEG tube placement due to recurrent ascites.  Currently has core track feeding. Palliative care consulted.  Legal guardianship is in process.  Poor prognosis. Patient remains full code  Subjective: Patient was seen and examined this morning.   Somnolent.  Opens eyes on verbal command.  Mumbles. Events of last night noted. Had vomiting afterwards core track feeding was stopped.  Probably aspirated.  1 episode of temperature 100.5.   Patient also having regular bowel movements.  Assessment and plan: # Aspiration pneumonitis Events of last night noted. Had vomiting afterwards core track feeding was stopped.  Probably aspirated.   Started presumptively on IV Rocephin this morning. Recent Labs  Lab 09/26/22 0326 09/27/22 2536 09/28/22 0756 09/29/22 0346 10/01/22 0314 10/02/22 0537  WBC 10.6* 8.6 9.3 9.2 14.9*  --    PROCALCITON  --   --   --   --   --  0.29   acute GI bleeding in the setting of liver cirrhosis Acute blood loss anemia. 1/24, patient had an episode of hematochezia, hematemesis.  He feeding was stopped.  GI consultation was obtained.  Patient was started on Protonix. GI bleeding seems to have stabilized.  Hemoglobin roughly stable.  No EGD at this time per GI. Continue tube feeding Hemoglobin remains stable Recent Labs    09/10/22 1115 09/10/22 1611 09/11/22 1708 09/26/22 0326 09/27/22 6440 09/28/22 0756 09/29/22 0346 10/01/22 0314  HGB  --   --    < > 8.9* 9.1* 9.0* 8.6* 10.1*  MCV  --   --    < > 105.4* 104.2* 104.6* 106.6* 107.8*  VITAMINB12  --  2,050*  --   --   --   --   --   --   FOLATE >40.0  --   --   --   --   --   --   --    < > = values in this interval not displayed.    Liver cirrhosis with anasarca Hypoalbuminemia Essential hypertension Per history, patient has worsening edema since October He has significant anasarca. Unable to consent but because of medical medical necessity, paracentesis has been done 5 times.  Last done on 1/28 when 4.2 L of bloody fluid was removed. Ascites seems to be reaccumulating again. Currently also on IV Protonix, IV Lasix and oral metoprolol.   Acute CVA prior CVA with left-sided deficits Brought to the ED after patient was  noted to be less interactive compared to her baseline.   MRI showed left acute coronary due to small infarct  Stroke workup completed. Neurology consult appreciated Echo showed EF of 55 to 60% with no cardiac source of embolism A1c 4.5, LDL 74 Because of active bleeding, patient is currently not on any antithrombotic or anticoagulant. Statin not started because of underlying liver cirrhosis Current deficit include expressive aphasia and dysphagia.  Acute medical encephalopathy Hepatic encephalopathy Ammonia level elevated, partly contributing to poor mental status.  Started on lactulose. Recent Labs   Lab 10/01/22 0314  AMMONIA 133*   Acute kidney injury Baseline creatinine around 1.  Presented with creatinine of 2.08, peaked at 2.12.  Fluctuating.  Creatinine 1.8 on last check yesterday.  Continue IV Lasix.   Recent Labs    09/21/22 0604 09/22/22 1100 09/23/22 0335 09/24/22 0328 09/25/22 1020 09/26/22 0326 09/27/22 4132 09/28/22 0756 09/29/22 0346 10/01/22 0314  BUN 58* 61* 65* 66* 72* 71* 69* 65* 60* 59*  CREATININE 2.00* 1.87* 1.86* 1.66* 1.86* 1.81* 1.77* 1.80* 1.68* 1.84*    COPD Continue bronchodilators  Dysphagia Hypoglycemia 1/24, because of GI bleeding, core track feeding.  GI bleeding stopped.  Tube feeding has now been resumed.  Blood sugar level stable. Recent Labs  Lab 10/01/22 1944 10/02/22 0437 10/02/22 0822 10/02/22 0917 10/02/22 1221  GLUCAP 91 76 69* 161* 97    BPH Flomax  Impaired mobility PT evaluation obtained.  SNF recommended  Goals of care   Code Status: Full Code.  Poor prognosis.  DSS involved but no legal guardian yet. 1/19, called and updated [facility] for guardianship [name], [phone]).  I asked for advanced directives.  She was going to reach out to her supervisor and get back to me.  I have not heard any thing from her since then. 1/29, palliative care reached out to DSS again.  They were told that petition was filed on 1/29 for guardianship.   Ethics involved.  Remains full code as of now.  Scheduled Meds:  feeding supplement (PROSource TF20)  60 mL Per Tube Daily   fiber supplement (BANATROL TF)  60 mL Per Tube BID   free water  200 mL Per Tube Q4H   furosemide  40 mg Intravenous Daily   lactulose  10 g Per Tube BID   lidocaine (PF)  15 mL Intradermal Once   lidocaine (PF)  5 mL Intradermal Once   metoprolol tartrate  25 mg Per Tube BID   pantoprazole (PROTONIX) IV  40 mg Intravenous Q24H   tamsulosin  0.4 mg Oral Daily    PRN meds: [DISCONTINUED] acetaminophen **OR** acetaminophen  (TYLENOL) oral liquid 160 mg/5 mL **OR** acetaminophen, dextrose, Gerhardt's butt cream, ipratropium-albuterol, ondansetron (ZOFRAN) IV, senna-docusate   Infusions:   cefTRIAXone (ROCEPHIN)  IV 1 g (10/02/22 1000)   feeding supplement (JEVITY 1.5 CAL/FIBER) 1,000 mL (10/02/22 1027)    Skin assessment:  Pressure Injury 09/24/22 Heel Left;Posterior Deep Tissue Pressure Injury - Purple or maroon localized area of discolored intact skin or blood-filled blister due to damage of underlying soft tissue from pressure and/or shear. Round, dark brown spot (Active)  09/24/22 0903  Location: Heel  Location Orientation: Left;Posterior  Staging: Deep Tissue Pressure Injury - Purple or maroon localized area of discolored intact skin or blood-filled blister due to damage of underlying soft tissue from pressure and/or shear.  Wound Description (Comments): Round, dark brown spot  Present on Admission: No    Nutritional status:  Body mass index is 21.65 kg/m.  Nutrition Problem: Moderate Malnutrition Etiology: chronic illness Signs/Symptoms: moderate fat depletion, severe muscle depletion     Diet:  Diet Order             Diet NPO time specified  Diet effective now                   DVT prophylaxis:  SCD's Start: 09/10/22 1605   Antimicrobials: IV Rocephin Fluid: None currently Consultants: Palliative care, IR Family Communication: None at bedside.    Status is: Inpatient  Continue in-hospital care because:  On core track feeding. Ascites is already accumulating again. Not medically stable for discharge.  Poor prognosis Level of care: Telemetry Medical   Dispo: The patient is from: [facility] long-term care              Anticipated d/c is to: Poor prognosis.   Antimicrobials: Anti-infectives (From admission, onward)    Start     Dose/Rate Route Frequency Ordered Stop   10/02/22 1000  cefTRIAXone (ROCEPHIN) 1 g in sodium chloride 0.9 % 100 mL IVPB        1 g 200 mL/hr  over 30 Minutes Intravenous Every 24 hours 10/02/22 0855     09/10/22 1345  cefTRIAXone (ROCEPHIN) 1 g in sodium chloride 0.9 % 100 mL IVPB        1 g 200 mL/hr over 30 Minutes Intravenous  Once 09/10/22 1331 09/10/22 1605   09/10/22 1345  azithromycin (ZITHROMAX) 500 mg in sodium chloride 0.9 % 250 mL IVPB        500 mg 250 mL/hr over 60 Minutes Intravenous  Once 09/10/22 1331 09/10/22 1605       Objective: Vitals:   10/02/22 0739 10/02/22 1210  BP: (!) 102/54 118/66  Pulse: 92 71  Resp: 17 20  Temp: 97.9 F (36.6 C) 97.8 F (36.6 C)  SpO2: 99%     Intake/Output Summary (Last 24 hours) at 10/02/2022 1308 Last data filed at 10/02/2022 0000 Gross per 24 hour  Intake --  Output 250 ml  Net -250 ml    Filed Weights   09/30/22 0500 10/01/22 0500 10/02/22 0450  Weight: 62.9 kg 64.5 kg 62.7 kg   Weight change: -1.8 kg Body mass index is 21.65 kg/m.   Physical Exam: General exam: Middle-aged African-American male.  Not in pain. Skin: No rashes, lesions or ulcers. HEENT: Atraumatic, normocephalic, no obvious bleeding.  Core track feeding ongoing Lungs: Diminished air entry in both bases.  Moaning but not requiring supplemental oxygen CVS: Regular rate and rhythm, no murmur GI/Abd: Abdomen listed and worsening again. Nontender to touch CNS: Somnolent, opens eyes on verbal command has expressive aphasia.  Able to follow motor commands Psychiatry: Sad affect Extremities: 2+ bilateral pedal edema, no calf tenderness  Data Review: I have personally reviewed the laboratory data and studies available.  F/u labs ordered Unresulted Labs (From admission, onward)    None       Total time spent in review of labs and imaging, patient evaluation, formulation of plan, documentation and communication with family: 45 minutes  Signed, Lorin Glass, MD [facility] 10/02/2022

## 2022-10-02 NOTE — Progress Notes (Signed)
Patient had been breathing 40 times per min when I came in at 0430 , gave breathing treatment and brought back down to 25 breaths per min. Respiratory rate improved, no wheezing noted. Then his HR dropped from 120s down to 70s and had stopped breathing for about 10 seconds, sats dropped to 80s during that time but came back to with some sternal rub. Sats currently 98% on room air, HR 110. Temp 100.5, tylenol prn given.

## 2022-10-02 NOTE — Progress Notes (Signed)
TR[facility] night cross cover note:   I was notified by RN that the patient experience vomiting, with the appearance of the vomitus similar to that of his continuous tube feeds.  Tube feeds held at that time.  RN notes that patient has had multiple bowel movements, and continues to have active bowel sounds.  After the episode of vomiting, the patient showed evidence of progressive tachypnea, with respiratory rate into the mid 20s to 30s, corresponding with decline in oxygen saturation to 90% on room air relative to his previous oxygen saturations in the mid to high 90s on room air.  Ensuing temperature increased to 100.5, representing at least a 24-hour temperature max.  His heart rate is also increased slightly into the low 100s relative to the previous values in the 80s.  Blood pressure remained stable, with systolic values in the 599J to 150s.    As he is aphasic, it is difficult to assess any associated acute symptoms in the context of the above process.   He has noted to have a distended abdomen in the setting of cirrhosis.  And has undergone multiple paracenteses recently for reaccumulation of his ascites.  Likely posing a restrictive process from a pulmonary perspective.   Per my chart review, he also has a history of COPD, and has received an nebulizer treatment, following which his breathing appears to slightly improved.  Given this history, we will also check blood gas.  For further evaluation of the above relative desaturation associated with increasing tachypnea, and relative increase in temperature, Coralynn Gaona chest x-ray, which showed no evidence of acute cardiopulmonary process, including no evidence of infiltrate, edema, effusion, or pneumothorax.  I have also added on procalcitonin level.  Will check EKG as well.  Given his relative increase in temperature, will's ordered urinalysis.  Additionally, prn Zofran order placed.    Babs Bertin, DO Hospitalist

## 2022-10-02 NOTE — Consult Note (Addendum)
[place] Nurse wound follow up Refer to previous consult note on 1/22. Previously noted Unstageable pressure injury to left heel is now a dark purple Deep tissue pressure injury; 1X1cm.  Pt is in Prevalon boots to reduce pressure, foam dressing is in place to protect from further injury.  [place] team will reassess next week to determine if a change in the plan of care is indicated at that time. Thank-you,  Julien Girt MSN, [place], [place], [place], [place]

## 2022-10-03 ENCOUNTER — Inpatient Hospital Stay (HOSPITAL_COMMUNITY): Payer: Medicaid Other

## 2022-10-03 DIAGNOSIS — I639 Cerebral infarction, unspecified: Secondary | ICD-10-CM | POA: Diagnosis not present

## 2022-10-03 HISTORY — PX: IR PARACENTESIS: IMG2679

## 2022-10-03 LAB — GLUCOSE, CAPILLARY
Glucose-Capillary: 86 mg/dL (ref 70–99)
Glucose-Capillary: 102 mg/dL — ABNORMAL HIGH (ref 70–99)
Glucose-Capillary: 86 mg/dL (ref 70–99)
Glucose-Capillary: 93 mg/dL (ref 70–99)
Glucose-Capillary: 98 mg/dL (ref 70–99)
Glucose-Capillary: 86 mg/dL (ref 70–99)

## 2022-10-03 MED ORDER — LIDOCAINE HCL 1 % IJ SOLN
INTRAMUSCULAR | Status: AC
  Administered 2022-10-03: 10 mL
  Filled 2022-10-03: qty 20

## 2022-10-03 MED ORDER — DEXTROSE IN LACTATED RINGERS 5 % IV SOLN: INTRAVENOUS | Status: DC

## 2022-10-03 NOTE — Plan of Care (Signed)
  Problem: Nutrition: Goal: Risk of aspiration will decrease Outcome: Progressing Goal: Dietary intake will improve Outcome: Progressing   

## 2022-10-03 NOTE — Progress Notes (Signed)
Physical Therapy Treatment Patient Details Name: Jacob Bass MRN: [id number] DOB: Oct 13, 1960 Today's Date: 10/03/2022   History of Present Illness 62 y.o. male presents to [facility] hospital on 09/10/2022 with AMS and L weakness from [facility]. 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 Complicated by fluid overload. Paracentesis performed 1/14, 1/16, 1/21, 1/27. PMH includes CVA, COPD, cirrhosis, gastric ulcer.    PT Comments    Pt progressing towards his physical therapy goals today, demonstrating improved alertness, verbal responses and command following. Pt requiring two person mod-max assist for functional mobility. Worked on static sitting balance, promoting L sided awareness, transfer training. Continue to recommend SNF for ongoing Physical Therapy.     Recommendations for follow up therapy are one component of a multi-disciplinary discharge planning process, led by the attending physician.  Recommendations may be updated based on patient status, additional functional criteria and insurance authorization.  Follow Up Recommendations  Skilled nursing-short term rehab (<3 hours/day) Can patient physically be transported by private vehicle: No   Assistance Recommended at Discharge Frequent or constant Supervision/Assistance  Patient can return home with the following Two people to help with walking and/or transfers;Two people to help with bathing/dressing/bathroom;Assistance with cooking/housework;Assistance with feeding;Direct supervision/assist for medications management;Direct supervision/assist for financial management;Assist for transportation;Help with stairs or ramp for entrance   Equipment Recommendations  Wheelchair (measurements PT);Wheelchair cushion (measurements PT);Hospital bed;Other (comment) (hoyer lift)    Recommendations for Other Services       Precautions / Restrictions Precautions Precautions: Fall;Other (comment) Precaution Comments: cortrak Restrictions Weight  Bearing Restrictions: No     Mobility  Bed Mobility Overal bed mobility: Needs Assistance Bed Mobility: Supine to Sit, Sit to Supine     Supine to sit: Mod assist, +2 for physical assistance Sit to supine: Max assist   General bed mobility comments: Pt mod A +2 following directions to come to sit, Pt assisting with return supine    Transfers Overall transfer level: Needs assistance Equipment used: 2 person hand held assist (bed pad) Transfers: Sit to/from Stand Sit to Stand: Max assist, +2 safety/equipment, +2 physical assistance           General transfer comment: Pt holding onto therapist with RUE, each sit<>stand, able to achieve 50-75% of upright, with improved initiation from Pt    Ambulation/Gait                   Stairs             Wheelchair Mobility    Modified Rankin (Stroke Patients Only)       Balance Overall balance assessment: Needs assistance Sitting-balance support: Single extremity supported, Feet supported Sitting balance-Leahy Scale: Fair Sitting balance - Comments: able to sit min guard unsupported with BLE on ground   Standing balance support: Bilateral upper extremity supported Standing balance-Leahy Scale: Zero                              Cognition Arousal/Alertness: Awake/alert Behavior During Therapy: Flat affect Overall Cognitive Status: No family/caregiver present to determine baseline cognitive functioning                                 General Comments: Hard to understand, but clearly communicating with therapists today. Simple command following at 50%, laughed appropriately at jokes (seemed to like them) greatly improved cognition from previous sessions  Exercises General Exercises - Lower Extremity Ankle Circles/Pumps: PROM, Both, Supine Other Exercises Other Exercises: light PROM LUE    General Comments        Pertinent Vitals/Pain Pain Assessment Pain Assessment:  Faces Faces Pain Scale: No hurt    Home Living                          Prior Function            PT Goals (current goals can now be found in the care plan section) Acute Rehab PT Goals Potential to Achieve Goals: Fair Progress towards PT goals: Progressing toward goals    Frequency    Min 2X/week      PT Plan Current plan remains appropriate    Co-evaluation PT/OT/SLP Co-Evaluation/Treatment: Yes Reason for Co-Treatment: Complexity of the patient's impairments (multi-system involvement);For patient/therapist safety;To address functional/ADL transfers PT goals addressed during session: Mobility/safety with mobility        AM-PAC PT "6 Clicks" Mobility   Outcome Measure  Help needed turning from your back to your side while in a flat bed without using bedrails?: A Lot Help needed moving from lying on your back to sitting on the side of a flat bed without using bedrails?: Total Help needed moving to and from a bed to a chair (including a wheelchair)?: Total Help needed standing up from a chair using your arms (e.g., wheelchair or bedside chair)?: Total Help needed to walk in hospital room?: Total Help needed climbing 3-5 steps with a railing? : Total 6 Click Score: 7    End of Session Equipment Utilized During Treatment: Gait belt Activity Tolerance: Patient tolerated treatment well Patient left: in bed;with call bell/phone within reach;with bed alarm set Nurse Communication: Mobility status PT Visit Diagnosis: Other abnormalities of gait and mobility (R26.89);Muscle weakness (generalized) (M62.81);Hemiplegia and hemiparesis Hemiplegia - Right/Left: Left Hemiplegia - caused by: Cerebral infarction     Time: 0912-0944 PT Time Calculation (min) (ACUTE ONLY): 32 min  Charges:  $Therapeutic Activity: 8-22 mins                     Lillia Pauls, PT, DPT Acute Rehabilitation Services Office [phone]    [name] 10/03/2022, 2:28 PM

## 2022-10-03 NOTE — Progress Notes (Signed)
Occupational Therapy Treatment Patient Details Name: Jacob Bass MRN: [id number] DOB: 13-Mar-1961 Today's Date: 10/03/2022   History of present illness 62 y.o. male presents to [facility] hospital on 09/10/2022 with AMS and L weakness from [facility]. 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 Complicated by fluid overload. Paracentesis performed 1/14, 1/16, 1/21, 1/27. PMH includes CVA, COPD, cirrhosis, gastric ulcer.   OT comments  Pt more alert, responding verbally and following simple commands today. Pt remains max A for oral care, face washing (hand over hand) but able to sit EOB min guard after mod A +2 to come to EOB. Able to complete 3 sit<>stand with max A +2. Pt very happy and laughing at end of session to jokes. OT will continue to follow acutely. Still feel that Palliative medicine consult is appropriate, and POC remains appropriate as Pt still requiring +2 assist for most aspects of care - but improved cognition this session.    Recommendations for follow up therapy are one component of a multi-disciplinary discharge planning process, led by the attending physician.  Recommendations may be updated based on patient status, additional functional criteria and insurance authorization.    Follow Up Recommendations  Skilled nursing-short term rehab (<3 hours/day)     Assistance Recommended at Discharge Frequent or constant Supervision/Assistance  Patient can return home with the following  Two people to help with walking and/or transfers;Two people to help with bathing/dressing/bathroom   Equipment Recommendations  Other (comment) (TBA)    Recommendations for Other Services      Precautions / Restrictions Precautions Precautions: Fall;Other (comment) Precaution Comments: cortrak Restrictions Weight Bearing Restrictions: No       Mobility Bed Mobility Overal bed mobility: Needs Assistance Bed Mobility: Supine to Sit, Sit to Supine     Supine to sit: Mod assist, +2 for physical  assistance Sit to supine: Max assist   General bed mobility comments: Pt mod A +2 following directions to come to sit, Pt assisting with return supine    Transfers Overall transfer level: Needs assistance Equipment used: 2 person hand held assist (bed pad) Transfers: Sit to/from Stand Sit to Stand: Max assist, +2 safety/equipment, +2 physical assistance           General transfer comment: Pt holding onto therapist with RUE, each sit<>stand with improved initiation from Pt     Balance Overall balance assessment: Needs assistance Sitting-balance support: Single extremity supported, Feet supported Sitting balance-Leahy Scale: Fair Sitting balance - Comments: able to sit min guard unsupported with BLE on ground   Standing balance support: Bilateral upper extremity supported Standing balance-Leahy Scale: Zero                             ADL either performed or assessed with clinical judgement   ADL Overall ADL's : Needs assistance/impaired Eating/Feeding: Maximal assistance (HOb ELEVATED, CHAIR POSITION)   Grooming: Maximal assistance;Oral care;Wash/dry face;Bed level Grooming Details (indicate cue type and reason): HOb elevated, hand over hand. Upper Body Bathing: Maximal assistance;Sitting Upper Body Bathing Details (indicate cue type and reason): unsuppported sitting, OT washed back             Toilet Transfer: Maximal assistance;+2 for physical assistance;+2 for safety/equipment   Toileting- Clothing Manipulation and Hygiene: Total assistance       Functional mobility during ADLs: Maximal assistance;+2 for physical assistance;+2 for safety/equipment General ADL Comments: Pt much more alert and participatory    Extremity/Trunk Assessment Upper Extremity Assessment  Upper Extremity Assessment: LUE deficits/detail LUE Coordination: decreased fine motor;decreased gross motor            Vision       Perception     Praxis      Cognition  Arousal/Alertness: Awake/alert Behavior During Therapy: Flat affect Overall Cognitive Status: No family/caregiver present to determine baseline cognitive functioning                                 General Comments: Hard to understand, but clearly communicating with therapists today. Simple command following at 50%, laughed appropriately at jokes (seemed to like them) greatly improved cognition from previous sessions        Exercises Other Exercises Other Exercises: light PROM LUE    Shoulder Instructions       General Comments      Pertinent Vitals/ Pain       Pain Assessment Pain Assessment: Faces Faces Pain Scale: No hurt Pain Intervention(s): Monitored during session, Repositioned  Home Living                                          Prior Functioning/Environment              Frequency  Min 2X/week        Progress Toward Goals  OT Goals(current goals can now be found in the care plan section)  Progress towards OT goals: Progressing toward goals  Acute Rehab OT Goals Patient Stated Goal: none stated OT Goal Formulation: Patient unable to participate in goal setting Time For Goal Achievement: 10/09/22 Potential to Achieve Goals: Fair  Plan Discharge plan remains appropriate;Frequency remains appropriate    Co-evaluation    PT/OT/SLP Co-Evaluation/Treatment: Yes Reason for Co-Treatment: Complexity of the patient's impairments (multi-system involvement);Necessary to address cognition/behavior during functional activity;For patient/therapist safety;To address functional/ADL transfers PT goals addressed during session: Mobility/safety with mobility;Proper use of DME;Balance;Strengthening/ROM OT goals addressed during session: ADL's and self-care;Strengthening/ROM      AM-PAC OT "6 Clicks" Daily Activity     Outcome Measure   Help from another person eating meals?: Total Help from another person taking care of personal  grooming?: A Lot Help from another person toileting, which includes using toliet, bedpan, or urinal?: Total Help from another person bathing (including washing, rinsing, drying)?: Total Help from another person to put on and taking off regular upper body clothing?: Total Help from another person to put on and taking off regular lower body clothing?: Total 6 Click Score: 7    End of Session Equipment Utilized During Treatment: Gait belt  OT Visit Diagnosis: Unsteadiness on feet (R26.81);Other abnormalities of gait and mobility (R26.89);Muscle weakness (generalized) (M62.81)   Activity Tolerance Patient tolerated treatment well   Patient Left in bed;with call bell/phone within reach;with bed alarm set   Nurse Communication Mobility status        Time: 1324-4010 OT Time Calculation (min): 32 min  Charges: OT General Charges $OT Visit: 1 Visit OT Treatments $Self Care/Home Management : 8-22 mins  Nyoka Cowden OTR/L Acute Rehabilitation Services Office: [phone]   Evern Bio [facility] 10/03/2022, 12:22 PM

## 2022-10-03 NOTE — Procedures (Signed)
PROCEDURE SUMMARY:  Successful US guided paracentesis from left lateral abdomen.  Yielded 3.1 liters of hazy yellow fluid.  No immediate complications.  Patient tolerated well.  EBL = trace  Lurleen Soltero S Docia Klar PA-C 10/03/2022 3:52 PM

## 2022-10-03 NOTE — Progress Notes (Signed)
Patient cortrak is clogged upon checking, feeding stopped. On call provider informed. Will let the cortrak team know in AM.

## 2022-10-03 NOTE — Progress Notes (Signed)
PROGRESS NOTE  Jacob Bass  DOB: 1961-06-18  PCP: Donia Ast., MD ZOX:[id number]  DOA: 09/10/2022  LOS: 22 days  Hospital Day: 24  Brief narrative: Jacob Bass is a 62 y.o. male with PMH significant for history of stroke with residual left-sided deficits, HTN, liver cirrhosis, chronic anemia, COPD, recent diagnosis of COVID who lives at [facility]. 1/8, patient was sent to the ED for altered mental status and weakness.  Last known normal the previous night.  In the ED, patient was afebrile, hemodynamically stable Labs with creatinine elevated to 2.09, ammonia level elevated to 99 CT head unremarkable MRI brain positive for small acute infarct in the left coronary data CTA of head and neck showed focal stenosis at the right V1 and evidence suspicious for pseudoaneurysm Also showed moderate focal stenosis of the right P1 left P2.  Admitted to [facility] Neurology consulted for acute stroke  His hospital course has been complicated by recurrent ascites and dysphagia.  He has required paracentesis x 4.  Dysphagia is not improving and he is not a candidate for PEG tube placement due to recurrent ascites.  Currently has core track feeding. Palliative care consulted.  Legal guardianship is in process.  Poor prognosis. Patient remains full code  Subjective: Patient was seen and examined this morning. Awake, mumbles. On core track feeding. Also on dysphagia 1 diet.  Assessment and plan: Liver cirrhosis with anasarca Hypoalbuminemia Essential hypertension Per history, patient has worsening edema since October He has significant anasarca. Unable to consent but because of medical medical necessity, paracentesis is needed.  It has been done 5 times this admission.  Last done on 1/28 when 4.2 L of bloody fluid was removed. Ascites seems to be reaccumulating again.  I ordered for repeat paracentesis today. Currently also on IV Protonix, IV Lasix and oral metoprolol.  Recent Labs  Lab  09/27/22 [phone] 09/28/22 0756 09/29/22 0346 10/01/22 0314  AMMONIA  --   --   --  133*  PLT 106* 117* 112* 117*   Acute GI bleeding in the setting of liver cirrhosis Acute blood loss anemia. 1/24, patient had an episode of hematochezia, hematemesis.  He feeding was stopped.  GI consultation was obtained.  Patient was started on Protonix. GI bleeding seems to have stabilized.  Hemoglobin roughly stable.  No EGD at this time per GI. Continue tube feeding Hemoglobin to be repeated tomorrow Recent Labs    09/10/22 1115 09/10/22 1611 09/11/22 1708 09/26/22 0326 09/27/22 1478 09/28/22 0756 09/29/22 0346 10/01/22 0314  HGB  --   --    < > 8.9* 9.1* 9.0* 8.6* 10.1*  MCV  --   --    < > 105.4* 104.2* 104.6* 106.6* 107.8*  VITAMINB12  --  2,050*  --   --   --   --   --   --   FOLATE >40.0  --   --   --   --   --   --   --    < > = values in this interval not displayed.   Aspiration pneumonitis 1/20, patient vomited..  Tube feeding was stopped.  Patient probably aspirated as well.  He had an episode of fever. Started presumptively on IV Rocephin this morning. Recent Labs  Lab 09/27/22 0638 09/28/22 0756 09/29/22 0346 10/01/22 0314 10/02/22 0537  WBC 8.6 9.3 9.2 14.9*  --   PROCALCITON  --   --   --   --  0.29   Acute CVA prior  CVA with left-sided deficits Brought to the ED after patient was noted to be less interactive compared to her baseline.   MRI showed left acute coronary due to small infarct  Stroke workup completed. Neurology consult appreciated Echo showed EF of 55 to 60% with no cardiac source of embolism A1c 4.5, LDL 74 Because of active bleeding, patient is currently not on any antithrombotic or anticoagulant. Statin not started because of underlying liver cirrhosis Current deficit include expressive aphasia and dysphagia.  Dysphagia Secondary to stroke  has ongoing core track feeding. Also on dysphagia 1 diet today  Acute medical encephalopathy Hepatic  encephalopathy Ammonia level elevated, partly contributing to poor mental status.  Started on lactulose. Recent Labs  Lab 10/01/22 0314  AMMONIA 133*   Acute kidney injury Baseline creatinine around 1.  Presented with creatinine of 2.08, peaked at 2.12.  Fluctuating.  Creatinine 1.8 on last check on 1/29.  Continue IV Lasix.  Repeat labs tomorrow. Recent Labs    09/21/22 0604 09/22/22 1100 09/23/22 0335 09/24/22 0328 09/25/22 1020 09/26/22 0326 09/27/22 5638 09/28/22 0756 09/29/22 0346 10/01/22 0314  BUN 58* 61* 65* 66* 72* 71* 69* 65* 60* 59*  CREATININE 2.00* 1.87* 1.86* 1.66* 1.86* 1.81* 1.77* 1.80* 1.68* 1.84*   COPD Continue bronchodilators  Dysphagia Hypoglycemia 1/24, because of GI bleeding, core track feeding.  GI bleeding stopped.  Tube feeding has now been resumed.  Blood sugar level stable. Recent Labs  Lab 10/02/22 2322 10/03/22 0313 10/03/22 0734 10/03/22 0735 10/03/22 1201  GLUCAP 81 86 86 102* 93   BPH Flomax  Impaired mobility PT evaluation obtained.  SNF recommended  Goals of care   Code Status: Full Code.  Poor prognosis.  DSS involved but no legal guardian yet. 1/19, called and updated [facility] for guardianship [name], [phone]).  I asked for advanced directives.  She was going to reach out to her supervisor and get back to me.  I have not heard any thing from her since then. 1/29, palliative care reached out to DSS again.  They were told that petition was filed on 1/29 for guardianship.   Ethics involved.  Remains full code as of now.  Scheduled Meds:  feeding supplement (PROSource TF20)  60 mL Per Tube Daily   fiber supplement (BANATROL TF)  60 mL Per Tube BID   free water  200 mL Per Tube Q4H   furosemide  40 mg Intravenous Daily   lactulose  10 g Per Tube BID   lidocaine (PF)  15 mL Intradermal Once   lidocaine (PF)  5 mL Intradermal Once   metoprolol tartrate  25 mg Per Tube BID   pantoprazole (PROTONIX) IV   40 mg Intravenous Q24H   tamsulosin  0.4 mg Oral Daily    PRN meds: [DISCONTINUED] acetaminophen **OR** acetaminophen (TYLENOL) oral liquid 160 mg/5 mL **OR** acetaminophen, dextrose, Gerhardt's butt cream, ipratropium-albuterol, ondansetron (ZOFRAN) IV, senna-docusate   Infusions:   cefTRIAXone (ROCEPHIN)  IV 1 g (10/03/22 1101)   feeding supplement (JEVITY 1.5 CAL/FIBER) 40 mL/hr at 10/03/22 0646    Skin assessment:  Pressure Injury 09/24/22 Heel Left;Posterior Deep Tissue Pressure Injury - Purple or maroon localized area of discolored intact skin or blood-filled blister due to damage of underlying soft tissue from pressure and/or shear. Round, dark brown spot (Active)  09/24/22 0903  Location: Heel  Location Orientation: Left;Posterior  Staging: Deep Tissue Pressure Injury - Purple or maroon localized area of discolored intact skin or blood-filled blister  due to damage of underlying soft tissue from pressure and/or shear.  Wound Description (Comments): Round, dark brown spot  Present on Admission: No    Nutritional status:  Body mass index is 21.75 kg/m.  Nutrition Problem: Moderate Malnutrition Etiology: chronic illness Signs/Symptoms: moderate fat depletion, severe muscle depletion     Diet:  Diet Order             DIET - DYS 1 Room service appropriate? No; Fluid consistency: Honey Thick  Diet effective now                   DVT prophylaxis:  SCD's Start: 09/10/22 1605   Antimicrobials: IV Rocephin Fluid: None currently Consultants: Palliative care, IR Family Communication: None at bedside.    Status is: Inpatient  Continue in-hospital care because:  On core track feeding. Ascites is already accumulating again. Not medically stable for discharge.  Poor prognosis Level of care: Telemetry Medical   Dispo: The patient is from: [facility] long-term care              Anticipated d/c is to: Poor prognosis.   Antimicrobials: Anti-infectives (From  admission, onward)    Start     Dose/Rate Route Frequency Ordered Stop   10/02/22 1000  cefTRIAXone (ROCEPHIN) 1 g in sodium chloride 0.9 % 100 mL IVPB        1 g 200 mL/hr over 30 Minutes Intravenous Every 24 hours 10/02/22 0855     09/10/22 1345  cefTRIAXone (ROCEPHIN) 1 g in sodium chloride 0.9 % 100 mL IVPB        1 g 200 mL/hr over 30 Minutes Intravenous  Once 09/10/22 1331 09/10/22 1605   09/10/22 1345  azithromycin (ZITHROMAX) 500 mg in sodium chloride 0.9 % 250 mL IVPB        500 mg 250 mL/hr over 60 Minutes Intravenous  Once 09/10/22 1331 09/10/22 1605       Objective: Vitals:   10/03/22 0736 10/03/22 1303  BP: 127/63 139/69  Pulse: 83 80  Resp: 18 18  Temp: 98 F (36.7 C) 98.6 F (37 C)  SpO2:  97%    Intake/Output Summary (Last 24 hours) at 10/03/2022 1324 Last data filed at 10/03/2022 0800 Gross per 24 hour  Intake 340 ml  Output 650 ml  Net -310 ml   Filed Weights   10/01/22 0500 10/02/22 0450 10/03/22 0315  Weight: 64.5 kg 62.7 kg 63 kg   Weight change: 0.3 kg Body mass index is 21.75 kg/m.   Physical Exam: General exam: Middle-aged African-American male.  Not in pain. Skin: No rashes, lesions or ulcers. HEENT: Atraumatic, normocephalic, no obvious bleeding.  Core track feeding ongoing Lungs: Diminished air entry in both bases.  Moaning but not requiring supplemental oxygen CVS: Regular rate and rhythm, no murmur GI/Abd: Abdomen listed and worsening again. Nontender to touch CNS: Somnolent, opens eyes on verbal command has expressive aphasia.  Able to follow motor commands Psychiatry: Sad affect Extremities: 2+ bilateral pedal edema, no calf tenderness  Data Review: I have personally reviewed the laboratory data and studies available.  F/u labs ordered Unresulted Labs (From admission, onward)     Start     Ordered   10/04/22 0500  Basic metabolic panel  Tomorrow morning,   R       Question:  Specimen collection method  Answer:  Lab=Lab collect    10/03/22 0800   10/04/22 0500  CBC with Differential/Platelet  Tomorrow morning,  R       Question:  Specimen collection method  Answer:  Lab=Lab collect   10/03/22 0800   10/04/22 0500  Protime-INR  Tomorrow morning,   R       Question:  Specimen collection method  Answer:  Lab=Lab collect   10/03/22 1324            Total time spent in review of labs and imaging, patient evaluation, formulation of plan, documentation and communication with family: 45 minutes  Signed, Lorin Glass, MD [facility] 10/03/2022

## 2022-10-03 NOTE — Progress Notes (Signed)
Speech Language Pathology Treatment: Dysphagia  Patient Details Name: Jacob Bass MRN: [id number] DOB: [birth year] Today's Date: 10/03/2022 Time: 4098-1191 SLP Time Calculation (min) (ACUTE ONLY): 25 min  Assessment / Plan / Recommendation Clinical Impression  SLP assisted pt with lunch meal following initiation of po's yesterday. When mitt removed he was able to feed himself with right hand needing help to scoop food occasionally, tilt cup and open packages. Initially pt distracted by other items on table which therapist removed. Also needed assist to control bite size but his rate of feeding was appropriate. His oral transit became faster as session progressed and as he brought spoon to his mouth this would cue him to swallow. Right labial residue on lips and min spillage that he independently wipes. He belched then immediately coughed and noted to cough some at end of meals which is suspicious for possible esophageal involvement. Dietitian arrived during session and talked about hopefully getting Cortrak out when appropriate. Continue puree and honey thick liquids with FULL assist as he needs cues to attend to his meal from a cognitive standpoint however he can feed with right hand needing help to open packages, scoop from container and tilt cup to retrieve liquid form cup. Continue ST.    HPI HPI: Jacob Bass is a 62 y.o. male who presented to ED from [facility] via EMS with left sided weakness, speech changes, wheezing. PMH/PSH includes prior CVA, subarachnoid hemorrhage, cirrhosis, hep C, hx of etOH abuse, dementia, COPD.  Pt known to SLP from prior admissions, with baseline dysarthria and oral dysphagia. He was most recently seen by SLP on 04/13/22 with recommendations for Dysphagia 1 solids and thin liquids. NPO with Cortrak, can't get PEG due to acities. ST has seen him for therapy consistently with improvements and he is now appropriate for his second MBS.      SLP Plan  Continue with  current plan of care      Recommendations for follow up therapy are one component of a multi-disciplinary discharge planning process, led by the attending physician.  Recommendations may be updated based on patient status, additional functional criteria and insurance authorization.    Recommendations  Diet recommendations: Dysphagia 1 (puree);Honey-thick liquid Liquids provided via: Cup Medication Administration: Via alternative means (or crush) Supervision: Patient able to self feed;Staff to assist with self feeding;Full supervision/cueing for compensatory strategies Compensations: Minimize environmental distractions;Slow rate;Small sips/bites;Lingual sweep for clearance of pocketing;Monitor for anterior loss Postural Changes and/or Swallow Maneuvers: Seated upright 90 degrees                Oral Care Recommendations: Oral care BID Follow Up Recommendations: Skilled nursing-short term rehab (<3 hours/day) Assistance recommended at discharge: Frequent or constant Supervision/Assistance SLP Visit Diagnosis: Dysphagia, oropharyngeal phase (R13.12) Plan: Continue with current plan of care           Houston Siren  10/03/2022, 2:36 PM

## 2022-10-03 NOTE — Progress Notes (Signed)
Nutrition Follow-up  DOCUMENTATION CODES:  Non-severe (moderate) malnutrition in context of chronic illness  INTERVENTION:  Continue current diet as ordered, nursing staff to assist with feeding and tray set up Continue tube feeding via cortrak: Jevity 1.5 at 60 ml/h (1440 ml per day) Prosource TF20 60 ml 1x/d Free water 255m q4h per MD Provides 2060 kcal, 104 gm protein, 1003 ml free water daily (22054mTF+flush) Monitor guardianship status  NUTRITION DIAGNOSIS:  Moderate Malnutrition related to chronic illness as evidenced by moderate fat depletion, severe muscle depletion. - remains applicable  GOAL:  Patient will meet greater than or equal to 90% of their needs - being met with TF at goal  MONITOR:  Diet advancement, Labs, I & O's, TF tolerance  REASON FOR ASSESSMENT:  New TF (on cortrak list)    ASSESSMENT:  Pt with hx of HTN, COPD, cirrhosis, hx of etOH abuse, dementia, and prior CVA presented to ED from his nursing facility with AMS and weakness. Found to have suffered an acute infarct.   1/9 - MBS, NPO 1/14 - paracentesis, 3.8L of clear yellow fluid removed 1/17 - MBS, NPO, SLP signed off on acute treatment, noted that oral phase of swallow is not functional, paracentesis, 5.8L removed 1/21 - paracentesis, 3.5L removed 1/23 - paracentesis, 3.4L removed  1/27 - paracentesis, 4.2L removed 1/30 - MBS, DYS 1 with honey think liquids  Pt working with SLP at the time of assessment. Was able to have diet advanced yesterday after MBS. Discussed with SLP, has been receiving therapy daily. Today, pt ate a few bites of his mac and cheese and meat and 50% of his magic cup during SLP visit. Pt needs full supervision and help with tray set up, can use his right hand to feed himself.   TF held overnight after pt had an instance of vomiting. XR clear. TF restarted currently and infusing at 5055m. Pt to be taken for paracentesis this afternoon.   Pt able to shake and nod his head  to questions, prefers vanilla flavors for magic cup. Once pt is consistently eating some of his meals, will begin triturating TF down to remove cortrak tube..  Nutritionally Relevant Medications: Scheduled Meds:  feeding supplement (PROSource TF20)  60 mL Per Tube Daily   fiber supplement (BANATROL TF)  60 mL Per Tube BID   free water  200 mL Per Tube Q4H   furosemide  40 mg Intravenous Daily   lactulose  10 g Per Tube BID   pantoprazole IV  40 mg Intravenous Q24H   tamsulosin  0.4 mg Oral Daily   Continuous Infusions:  cefTRIAXone (ROCEPHIN)  IV Stopped (10/02/22 1032)   feeding supplement (JEVITY 1.5 CAL/FIBER) 40 mL/hr at 10/03/22 0646   PRN Meds: ondansetron, senna-docusate  Labs Reviewed: CBG ranges from 63-161 mg/dL over the last 24 hours  NUTRITION - FOCUSED PHYSICAL EXAM: Flowsheet Row Most Recent Value  Orbital Region Mild depletion  Upper Arm Region Moderate depletion  Thoracic and Lumbar Region Moderate depletion  Buccal Region Mild depletion  Temple Region Mild depletion  Clavicle Bone Region Mild depletion  Clavicle and Acromion Bone Region Severe depletion  Scapular Bone Region Moderate depletion  Dorsal Hand Severe depletion  Patellar Region No depletion  Anterior Thigh Region No depletion  Posterior Calf Region No depletion  Edema (RD Assessment) Moderate  [significant edema to the BLE]  Hair Reviewed  Eyes Reviewed  Mouth Reviewed  Skin Reviewed  Nails Reviewed    Diet Order:  Diet Order             DIET - DYS 1 Room service appropriate? No; Fluid consistency: Honey Thick  Diet effective now                   EDUCATION NEEDS:  Not appropriate for education at this time  Skin:  Skin Assessment: Reviewed RN Assessment  Last BM:  1/30 - type 7  Height:  Ht Readings from Last 1 Encounters:  09/10/22 5' 7"  (1.702 m)    Weight:  Wt Readings from Last 1 Encounters:  10/03/22 63 kg    Ideal Body Weight:  67.3 kg  BMI:  Body mass  index is 21.75 kg/m.  Estimated Nutritional Needs:  Kcal:  1900-2100 kcal/d Protein:  90-105g/d Fluid:  2L/d    Ranell Patrick, RD, LDN Clinical Dietitian RD pager # available in [place]  After hours/weekend pager # available in [facility]

## 2022-10-04 DIAGNOSIS — I639 Cerebral infarction, unspecified: Secondary | ICD-10-CM | POA: Diagnosis not present

## 2022-10-04 LAB — CBC WITH DIFFERENTIAL/PLATELET
Abs Immature Granulocytes: 0.05 10*3/uL (ref 0.00–0.07)
Basophils Absolute: 0 10*3/uL (ref 0.0–0.1)
Basophils Relative: 0 %
Eosinophils Absolute: 0.2 10*3/uL (ref 0.0–0.5)
Eosinophils Relative: 2 %
HCT: 27.4 % — ABNORMAL LOW (ref 39.0–52.0)
Hemoglobin: 9 g/dL — ABNORMAL LOW (ref 13.0–17.0)
Immature Granulocytes: 1 %
Lymphocytes Relative: 16 %
Lymphs Abs: 1.5 10*3/uL (ref 0.7–4.0)
MCH: 35.2 pg — ABNORMAL HIGH (ref 26.0–34.0)
MCHC: 32.8 g/dL (ref 30.0–36.0)
MCV: 107 fL — ABNORMAL HIGH (ref 80.0–100.0)
Monocytes Absolute: 1 10*3/uL (ref 0.1–1.0)
Monocytes Relative: 11 %
Neutro Abs: 6.7 10*3/uL (ref 1.7–7.7)
Neutrophils Relative %: 70 %
Platelets: 119 10*3/uL — ABNORMAL LOW (ref 150–400)
RBC: 2.56 MIL/uL — ABNORMAL LOW (ref 4.22–5.81)
RDW: 20.6 % — ABNORMAL HIGH (ref 11.5–15.5)
WBC: 9.5 10*3/uL (ref 4.0–10.5)
nRBC: 0 % (ref 0.0–0.2)

## 2022-10-04 LAB — GLUCOSE, CAPILLARY
Glucose-Capillary: 137 mg/dL — ABNORMAL HIGH (ref 70–99)
Glucose-Capillary: 73 mg/dL (ref 70–99)
Glucose-Capillary: 76 mg/dL (ref 70–99)
Glucose-Capillary: 77 mg/dL (ref 70–99)
Glucose-Capillary: 87 mg/dL (ref 70–99)
Glucose-Capillary: 92 mg/dL (ref 70–99)
Glucose-Capillary: 96 mg/dL (ref 70–99)

## 2022-10-04 LAB — COMPREHENSIVE METABOLIC PANEL
ALT: 46 U/L — ABNORMAL HIGH (ref 0–44)
AST: 79 U/L — ABNORMAL HIGH (ref 15–41)
Albumin: 1.8 g/dL — ABNORMAL LOW (ref 3.5–5.0)
Alkaline Phosphatase: 93 U/L (ref 38–126)
Anion gap: 10 (ref 5–15)
BUN: 73 mg/dL — ABNORMAL HIGH (ref 8–23)
CO2: 23 mmol/L (ref 22–32)
Calcium: 7.9 mg/dL — ABNORMAL LOW (ref 8.9–10.3)
Chloride: 118 mmol/L — ABNORMAL HIGH (ref 98–111)
Creatinine, Ser: 2.03 mg/dL — ABNORMAL HIGH (ref 0.61–1.24)
GFR, Estimated: 36 mL/min — ABNORMAL LOW (ref >60)
Glucose, Bld: 94 mg/dL (ref 70–99)
Potassium: 3.7 mmol/L (ref 3.5–5.1)
Sodium: 151 mmol/L — ABNORMAL HIGH (ref 135–145)
Total Bilirubin: 1 mg/dL (ref 0.3–1.2)
Total Protein: 6.3 g/dL — ABNORMAL LOW (ref 6.5–8.1)

## 2022-10-04 LAB — PROTIME-INR
INR: 1.4 — ABNORMAL HIGH (ref 0.8–1.2)
Prothrombin Time: 17.2 seconds — ABNORMAL HIGH (ref 11.4–15.2)

## 2022-10-04 LAB — AMMONIA: Ammonia: 40 umol/L — ABNORMAL HIGH (ref 9–35)

## 2022-10-04 NOTE — Progress Notes (Signed)
PROGRESS NOTE  Jacob Bass  DOB: 10/28/1960  PCP: Donia Ast., MD ZOX:[id number]  DOA: 09/10/2022  LOS: 23 days  Hospital Day: 25  Brief narrative: Jacob Bass is a 62 y.o. male with PMH significant for history of stroke with residual left-sided deficits, HTN, liver cirrhosis, chronic anemia, COPD, recent diagnosis of COVID who lives at [facility]. 1/8, patient was sent to the ED for altered mental status and weakness.  Last known normal the previous night.  In the ED, patient was afebrile, hemodynamically stable Labs with creatinine elevated to 2.09, ammonia level elevated to 99 CT head unremarkable MRI brain positive for small acute infarct in the left coronary data CTA of head and neck showed focal stenosis at the right V1 and evidence suspicious for pseudoaneurysm Also showed moderate focal stenosis of the right P1 left P2.  Admitted to [facility] Neurology consulted for acute stroke  His hospital course has been complicated by recurrent ascites and dysphagia.  He has required paracentesis x 4.  Dysphagia is not improving and he is not a candidate for PEG tube placement due to recurrent ascites.  Currently has core track feeding. Palliative care consulted.  Legal guardianship is in process.  Poor prognosis. Patient remains full code  Subjective: Patient was seen and examined this morning. Awake.  Sick looking.  Mumbles but unable to have a verbal conversation.  Follows motor commands intermittently.  Ascites slightly improved today after paracentesis yesterday. Event from last night noted.  Core track was clogged and tube feeding was stopped. Labs from this morning showed sodium level elevated to 151.  D5 drip was started.  Assessment and plan: Liver cirrhosis with anasarca/ascites Hypoalbuminemia Per history, patient has generalized anasarca and worsening pedal edema since October. Presented with significant ascites and has required paracentesis 6 times so far this  admission, last paracentesis on 1/31 with drainage of 3.1 L of hazy yellow fluid. Currently also on metoprolol and Protonix.  IV Lasix stopped this morning because of significantly elevated sodium.  Recent Labs  Lab 09/28/22 0756 09/29/22 0346 10/01/22 0314 10/04/22 0352  AST  --   --   --  79*  ALT  --   --   --  46*  ALKPHOS  --   --   --  93  BILITOT  --   --   --  1.0  PROT  --   --   --  6.3*  ALBUMIN  --   --   --  1.8*  AMMONIA  --   --  133* 40*  INR  --   --   --  1.4*  PLT 117* 112* 117* 119*   Acute GI bleeding in the setting of liver cirrhosis Acute blood loss anemia. 1/24, patient had an episode of hematochezia, hematemesis.  He feeding was stopped.  GI consultation was obtained.  Patient was started on Protonix. GI bleeding seems to have stabilized.  Hemoglobin roughly stable.  No EGD at this time per GI. Hemoglobin roughly stable Recent Labs    09/10/22 1115 09/10/22 1611 09/11/22 1708 09/27/22 8119 09/28/22 0756 09/29/22 0346 10/01/22 0314 10/04/22 0352  HGB  --   --    < > 9.1* 9.0* 8.6* 10.1* 9.0*  MCV  --   --    < > 104.2* 104.6* 106.6* 107.8* 107.0*  VITAMINB12  --  2,050*  --   --   --   --   --   --   FOLATE >40.0  --   --   --   --   --   --   --    < > =  values in this interval not displayed.   Acute CVA prior CVA with left-sided deficits Primarily brought to the ED after patient was noted to be less interactive compared to her baseline.   MRI showed left acute coronary due to small infarct  Stroke workup completed. Neurology consult appreciated Echo showed EF of 55 to 60% with no cardiac source of embolism A1c 4.5, LDL 74 Because of active bleeding, patient is currently not on any antithrombotic or anticoagulant. Statin not started because of underlying liver cirrhosis Current deficit include expressive aphasia and dysphagia.  Dysphagia Secondary to stroke  He has been on core track feeding since admission. It clogged last night and  hence feeding was stopped. Unable to put in a PEG tube because of recurrent ascites. Speech therapy following.  Currently on dysphagia 1 diet as well. Currently on dextrose drip primarily because of hypernatremia Recent Labs  Lab 10/03/22 1606 10/03/22 1937 10/04/22 0020 10/04/22 0343 10/04/22 0747  GLUCAP 98 86 77 76 73   Aspiration pneumonitis 1/20, patient vomited. Tube feeding was stopped.  Patient probably aspirated as well.  He had an episode of fever. Started presumptively on IV Rocephin this morning. Recent Labs  Lab 09/28/22 0756 09/29/22 0346 10/01/22 0314 10/02/22 0537 10/04/22 0352  WBC 9.3 9.2 14.9*  --  9.5  PROCALCITON  --   --   --  0.29  --    Acute medical encephalopathy Hepatic encephalopathy Ammonia level was elevated, partly contributing to poor mental status.  Started on lactulose. Ammonia level improving. Recent Labs  Lab 10/01/22 0314 10/04/22 0352  AMMONIA 133* 40*   Acute kidney injury Baseline creatinine around 1.  Presented with creatinine of 2.08, peaked at 2.12.  Fluctuating.  Patient has hypokalemia and portal hypertension leading to third spacing, ascites, anasarca but effective arterial hypovolemia and hence low renal perfusion.  Creatinine worsened to 2.03 today IV Lasix stopped. Recent Labs    09/22/22 1100 09/23/22 0335 09/24/22 0328 09/25/22 1020 09/26/22 0326 09/27/22 0626 09/28/22 0756 09/29/22 0346 10/01/22 0314 10/04/22 0352  BUN 61* 65* 66* 72* 71* 69* 65* 60* 59* 73*  CREATININE 1.87* 1.86* 1.66* 1.86* 1.81* 1.77* 1.80* 1.68* 1.84* 2.03*   Hypernatremia Sodium level elevated to 151 on blood work last night. Dextrose drip was started.  Continue the same for today Recent Labs  Lab 09/28/22 0756 09/29/22 0346 10/01/22 0314 10/04/22 0352  NA 143 146* 145 151*   COPD Continue bronchodilators  BPH Flomax  Impaired mobility PT evaluation obtained.  SNF recommended  Goals of care   Code Status: Full Code.   Poor prognosis.  DSS involved but no legal guardian yet. 1/19, called and updated [facility] for guardianship Jacob Bass, [phone]).  I asked for advanced directives.  She was going to reach out to her supervisor and get back to me.  I have not heard any thing from her since then. 1/29, palliative care reached out to DSS again.  They were told that petition was filed on 1/29 for guardianship.   Ethics involved.  Remains full code as of now.  Scheduled Meds:  feeding supplement (PROSource TF20)  60 mL Per Tube Daily   fiber supplement (BANATROL TF)  60 mL Per Tube BID   free water  200 mL Per Tube Q4H   lactulose  10 g Per Tube BID   lidocaine (PF)  15 mL Intradermal Once   lidocaine (PF)  5 mL Intradermal Once   metoprolol tartrate  25  mg Per Tube BID   pantoprazole (PROTONIX) IV  40 mg Intravenous Q24H   tamsulosin  0.4 mg Oral Daily    PRN meds: [DISCONTINUED] acetaminophen **OR** acetaminophen (TYLENOL) oral liquid 160 mg/5 mL **OR** acetaminophen, dextrose, Gerhardt's butt cream, ipratropium-albuterol, ondansetron (ZOFRAN) IV, senna-docusate   Infusions:   cefTRIAXone (ROCEPHIN)  IV 1 g (10/04/22 0949)   dextrose 5% lactated ringers 50 mL/hr at 10/03/22 2101   feeding supplement (JEVITY 1.5 CAL/FIBER) 40 mL/hr at 10/03/22 4696    Skin assessment:  Pressure Injury 09/24/22 Heel Left;Posterior Deep Tissue Pressure Injury - Purple or maroon localized area of discolored intact skin or blood-filled blister due to damage of underlying soft tissue from pressure and/or shear. Round, dark brown spot (Active)  09/24/22 0903  Location: Heel  Location Orientation: Left;Posterior  Staging: Deep Tissue Pressure Injury - Purple or maroon localized area of discolored intact skin or blood-filled blister due to damage of underlying soft tissue from pressure and/or shear.  Wound Description (Comments): Round, dark brown spot  Present on Admission: No     Pressure Injury  10/02/22 Thigh Left;Posterior;Proximal Stage 2 -  Partial thickness loss of dermis presenting as a shallow open injury with a red, pink wound bed without slough. Pink (Active)  10/02/22 0700  Location: Thigh  Location Orientation: Left;Posterior;Proximal  Staging: Stage 2 -  Partial thickness loss of dermis presenting as a shallow open injury with a red, pink wound bed without slough.  Wound Description (Comments): Pink  Present on Admission:     Nutritional status:  Body mass index is 20.65 kg/m.  Nutrition Problem: Moderate Malnutrition Etiology: chronic illness Signs/Symptoms: moderate fat depletion, severe muscle depletion     Diet:  Diet Order             DIET - DYS 1 Room service appropriate? No; Fluid consistency: Honey Thick  Diet effective now                   DVT prophylaxis:  SCD's Start: 09/10/22 1605   Antimicrobials: IV Rocephin Fluid: None currently Consultants: Palliative care, IR Family Communication: None at bedside.    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  Aphasia, dysphagia due to stroke.  Poor prognosis.  Unable to put in a PEG tube. Level of care: Telemetry Medical   Dispo: The patient is from: [facility] long-term care              Anticipated d/c is to: Poor prognosis.   Antimicrobials: Anti-infectives (From admission, onward)    Start     Dose/Rate Route Frequency Ordered Stop   10/02/22 1000  cefTRIAXone (ROCEPHIN) 1 g in sodium chloride 0.9 % 100 mL IVPB        1 g 200 mL/hr over 30 Minutes Intravenous Every 24 hours 10/02/22 0855     09/10/22 1345  cefTRIAXone (ROCEPHIN) 1 g in sodium chloride 0.9 % 100 mL IVPB        1 g 200 mL/hr over 30 Minutes Intravenous  Once 09/10/22 1331 09/10/22 1605   09/10/22 1345  azithromycin (ZITHROMAX) 500 mg in sodium chloride 0.9 % 250 mL IVPB        500 mg 250 mL/hr over 60 Minutes Intravenous  Once  09/10/22 1331 09/10/22 1605       Objective: Vitals:   10/04/22 0738 10/04/22 1142  BP: (!) 105/52 126/72  Pulse: 80 88  Resp: 19  20  Temp: 97.8 F (36.6 C) 98.4 F (36.9 C)  SpO2: 92% 100%    Intake/Output Summary (Last 24 hours) at 10/04/2022 1145 Last data filed at 10/04/2022 0444 Gross per 24 hour  Intake --  Output 400 ml  Net -400 ml   Filed Weights   10/02/22 0450 10/03/22 0315 10/04/22 0401  Weight: 62.7 kg 63 kg 59.8 kg   Weight change: -3.2 kg Body mass index is 20.65 kg/m.   Physical Exam: General exam: Middle-aged African-American male.  Not in pain. Skin: No rashes, lesions or ulcers. HEENT: Atraumatic, normocephalic, no obvious bleeding.   Lungs: Diminished air entry in both bases.  Moaning but not requiring supplemental oxygen CVS: Regular rate and rhythm, no murmur GI/Abd: Abdomen distended from ascites.  Slightly better today than yesterday.  Nontender to touch.   CNS: Alert, awake, inconsistently follows motor commands.  Unable to have a verbal communication. Extremities: 2+ bilateral pedal edema, no calf tenderness  Data Review: I have personally reviewed the laboratory data and studies available.  F/u labs ordered Unresulted Labs (From admission, onward)    None       Total time spent in review of labs and imaging, patient evaluation, formulation of plan, documentation and communication with family: 45 minutes  Signed, Lorin Glass, MD [facility] 10/04/2022

## 2022-10-04 NOTE — Progress Notes (Signed)
     Referral previously received for Jacob Bass for goals of care discussion. Noted most recent palliative in-person assessment dated 10/01/2022 at which time it was recommended to follow 4 to vision on guardianship and then plans [place] meeting with [place] guardian.  Chart reviewed for Recent provider notes, vitals, labs, and imaging.   At this time patient appears stable. No plan for in person follow-up today.  We will continue to await DSS decision on guardianship for further [name] conversations.    Please contact the palliative medicine provider on service for any new/urgent needs that require our assistance with this patient.  Thank you for your referral and allowing PMT to assist in [name] care.   Walden Field, NP Palliative Medicine Team Phone: [phone]  NO CHARGE

## 2022-10-05 DIAGNOSIS — I639 Cerebral infarction, unspecified: Secondary | ICD-10-CM | POA: Diagnosis not present

## 2022-10-05 LAB — GLUCOSE, CAPILLARY
Glucose-Capillary: 78 mg/dL (ref 70–99)
Glucose-Capillary: 73 mg/dL (ref 70–99)
Glucose-Capillary: 90 mg/dL (ref 70–99)
Glucose-Capillary: 93 mg/dL (ref 70–99)
Glucose-Capillary: 90 mg/dL (ref 70–99)
Glucose-Capillary: 115 mg/dL — ABNORMAL HIGH (ref 70–99)

## 2022-10-05 MED ORDER — JEVITY 1.5 CAL/FIBER PO LIQD
1000.0000 mL | ORAL | Status: DC
  Administered 2022-10-05 – 2022-10-15 (×8): 1000 mL
  Filled 2022-10-05 (×12): qty 1000

## 2022-10-05 NOTE — Progress Notes (Signed)
Brief Nutrition Note  New MD consult received. Pt continues to have cortrak tube in place and a DYS1/honey diet. Concern raised Wednesday night over a clogged tube and no medications or free water was administered yesterday. However, RN reports that tube flushes fine and is working. Discussed nutrition plan with SLP, RN, and MD. Tube will eventually have to be removed as pt can not discharge to SNF with it in place. Not a candidate for PEG or g-tube due to paracentesis. Pt making good progress with PO diet as long as he receives 1:1 assistance during meal and feed cues.  Will adjust TF to nocturnal and started calorie count to determine if pt is meeting his needs.   Nocturnal tube feeding via cortrak: Jevity 1.5 at 55 ml/h x 12 h (660 ml per day) Prosource TF20 60 ml 1x/d Free water 252mL q4h per MD Provides 1070 kcal, 62 gm protein, 502 ml free water daily (1779mL TF+flush)   Ranell Patrick, RD, LDN Clinical Dietitian RD pager # available in [place]  After hours/weekend pager # available in [facility]

## 2022-10-05 NOTE — Progress Notes (Signed)
Speech Language Pathology Treatment: Dysphagia  Patient Details Name: Jacob Bass MRN: [id number] DOB: 24-Nov-1960 Today's Date: 10/05/2022 Time: 6808-8110 SLP Time Calculation (min) (ACUTE ONLY): 11 min  Assessment / Plan / Recommendation Clinical Impression  With set up and therapist scooping food, Jacob Bass was able to bring spoon to mouth to feed himself with right hand as well as drink from cup. His oral transit time was faster today needing intermittent cues to swallow. He continues to need help to initiate eating due to distractibility and reduced attention and needs full assist with all meals. There were no signs of aspiration and swallow appeared more coordinated this morning. Continue Dys 1, honey thick liquids and FULL assist with all meals to ensure he is eating and drinking safely and to meet nutritional goals. Uncertain how long he will have Cortrak in place.    HPI HPI: Jacob Bass is a 62 y.o. male who presented to ED from [facility] via EMS with left sided weakness, speech changes, wheezing. PMH/PSH includes prior CVA, subarachnoid hemorrhage, cirrhosis, hep C, hx of etOH abuse, dementia, COPD.  Pt known to SLP from prior admissions, with baseline dysarthria and oral dysphagia. He was most recently seen by SLP on 04/13/22 with recommendations for Dysphagia 1 solids and thin liquids. NPO with Cortrak, can't get PEG due to acities. ST has seen him for therapy consistently with improvements and he is now appropriate for his second MBS.      SLP Plan  Continue with current plan of care      Recommendations for follow up therapy are one component of a multi-disciplinary discharge planning process, led by the attending physician.  Recommendations may be updated based on patient status, additional functional criteria and insurance authorization.    Recommendations  Diet recommendations: Dysphagia 1 (puree);Honey-thick liquid Liquids provided via: Cup Medication Administration:  Crushed with puree Supervision: Patient able to self feed;Staff to assist with self feeding;Full supervision/cueing for compensatory strategies Compensations: Minimize environmental distractions;Slow rate;Small sips/bites;Lingual sweep for clearance of pocketing Postural Changes and/or Swallow Maneuvers: Seated upright 90 degrees                Oral Care Recommendations: Oral care BID Follow Up Recommendations: Skilled nursing-short term rehab (<3 hours/day) Assistance recommended at discharge: Frequent or constant Supervision/Assistance SLP Visit Diagnosis: Dysphagia, oropharyngeal phase (R13.12) Plan: Continue with current plan of care           Jacob Bass  10/05/2022, 10:31 AM

## 2022-10-05 NOTE — Progress Notes (Signed)
Physical Therapy Treatment Patient Details Name: Jacob Bass MRN: [id number] DOB: 1961-07-12 Today's Date: 10/05/2022   History of Present Illness 62 y.o. male presents to [facility] hospital on 09/10/2022 with AMS and L weakness from [facility]. 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 Complicated by fluid overload. Paracentesis performed 1/14, 1/16, 1/21, 1/27. PMH includes CVA, COPD, cirrhosis, gastric ulcer.    PT Comments    Patient not progressing towards PT goals today. Session focused on standing and functional mobility. Requires assist of 2 to attempt standing from EOB but pt with poor effort noted to assist with transfer today. Not sure if it was due to having a BM. Able to scoot along side bed with Mod A and use of pad towards right using BUEs. Better able to roll towards left side using RUE than right side. Not trying to communicate much today except with grunts for yes/no responses. Continues to be appropriate for SNF. Will follow.    Recommendations for follow up therapy are one component of a multi-disciplinary discharge planning process, led by the attending physician.  Recommendations may be updated based on patient status, additional functional criteria and insurance authorization.  Follow Up Recommendations  Skilled nursing-short term rehab (<3 hours/day) Can patient physically be transported by private vehicle: No   Assistance Recommended at Discharge Frequent or constant Supervision/Assistance  Patient can return home with the following Two people to help with walking and/or transfers;Two people to help with bathing/dressing/bathroom;Assistance with cooking/housework;Assistance with feeding;Direct supervision/assist for medications management;Direct supervision/assist for financial management;Assist for transportation;Help with stairs or ramp for entrance   Equipment Recommendations  Wheelchair (measurements PT);Wheelchair cushion (measurements PT);Hospital bed;Other (comment)  (hoyer lift)    Recommendations for Other Services       Precautions / Restrictions Precautions Precautions: Fall;Other (comment) Precaution Comments: cortrak Restrictions Weight Bearing Restrictions: No     Mobility  Bed Mobility Overal bed mobility: Needs Assistance Bed Mobility: Supine to Sit, Rolling Rolling: Min assist, Max assist   Supine to sit: Mod assist, +2 for physical assistance     General bed mobility comments: Pt mod A +2 following directions to come to sit, Pt assisting with return supine, rolling easier towards left side with Min A using rail but needing max A to roll towards right.    Transfers Overall transfer level: Needs assistance Equipment used: 2 person hand held assist (bed pad and back of chair)   Sit to Stand: +2 physical assistance, Total assist          Lateral/Scoot Transfers: Mod assist General transfer comment: Pt holding onto back of chair with RUE, not providing much effort to asist with standing today, noted to have BM so pt trying to return to supine. Able to scoot along side bed towards right using BUEs with Mod A to reposition self to return to supine.    Ambulation/Gait               General Gait Details: Unable   Stairs             Wheelchair Mobility    Modified Rankin (Stroke Patients Only) Modified Rankin (Stroke Patients Only) Pre-Morbid Rankin Score: Moderate disability Modified Rankin: Severe disability     Balance Overall balance assessment: Needs assistance Sitting-balance support: Feet supported, No upper extremity supported Sitting balance-Leahy Scale: Fair Sitting balance - Comments: able to sit min guard unsupported with BLE       Standing balance comment: Unable today, lack of effort with attempted standing  Cognition Arousal/Alertness: Awake/alert Behavior During Therapy: Flat affect Overall Cognitive Status: No family/caregiver present to  determine baseline cognitive functioning                                 General Comments: Hard to understand, mostly grunting to yes/no questions but no consistently.  Simple command following at 50%.        Exercises      General Comments        Pertinent Vitals/Pain Pain Assessment Pain Assessment: Faces Faces Pain Scale: No hurt    Home Living                          Prior Function            PT Goals (current goals can now be found in the care plan section) Progress towards PT goals: Not progressing toward goals - comment (lack of effort today?? hard to tell due to language/cognition?)    Frequency    Min 2X/week      PT Plan Current plan remains appropriate    Co-evaluation              AM-PAC PT "6 Clicks" Mobility   Outcome Measure  Help needed turning from your back to your side while in a flat bed without using bedrails?: A Lot Help needed moving from lying on your back to sitting on the side of a flat bed without using bedrails?: Total Help needed moving to and from a bed to a chair (including a wheelchair)?: Total Help needed standing up from a chair using your arms (e.g., wheelchair or bedside chair)?: Total Help needed to walk in hospital room?: Total Help needed climbing 3-5 steps with a railing? : Total 6 Click Score: 7    End of Session Equipment Utilized During Treatment: Gait belt Activity Tolerance: Patient limited by fatigue;Patient tolerated treatment well Patient left: in bed;with call bell/phone within reach;with bed alarm set Nurse Communication: Mobility status PT Visit Diagnosis: Other abnormalities of gait and mobility (R26.89);Muscle weakness (generalized) (M62.81);Hemiplegia and hemiparesis Hemiplegia - Right/Left: Left Hemiplegia - caused by: Cerebral infarction     Time: 1610-9604 PT Time Calculation (min) (ACUTE ONLY): 20 min  Charges:  $Therapeutic Activity: 8-22 mins                      Vale Haven, PT, DPT Acute Rehabilitation Services Secure chat preferred Office [phone]      [name] 10/05/2022, 10:29 AM

## 2022-10-05 NOTE — Progress Notes (Signed)
PROGRESS NOTE  Jacob Bass  DOB: 08-12-1961  PCP: Donia Ast., MD OZH:[id number]  DOA: 09/10/2022  LOS: 24 days  Hospital Day: 26  Brief narrative: Favor Alfonse Ras is a 62 y.o. male with PMH significant for history of stroke with residual left-sided deficits, HTN, liver cirrhosis, chronic anemia, COPD, recent diagnosis of COVID who lives at [facility]. 1/8, patient was sent to the ED for altered mental status and weakness.  Last known normal the previous night.  In the ED, patient was afebrile, hemodynamically stable Labs with creatinine elevated to 2.09, ammonia level elevated to 99 CT head unremarkable MRI brain positive for small acute infarct in the left coronary data CTA of head and neck showed focal stenosis at the right V1 and evidence suspicious for pseudoaneurysm Also showed moderate focal stenosis of the right P1 left P2.  Admitted to [facility] Neurology consulted for acute stroke  Because of stroke, patient has dysphagia, expressive aphasia and worsening of left-sided deficits. His hospital course has been complicated by recurrent ascites and dysphagia.  He has required paracentesis x 4.  Was given core track feeding for about 3 weeks.  Not candidate for PEG tube placement due to recurrent ascites.  Core track feeding on hold for last 2 days because of recurrent aspiration and tube clogging..  On dysphagia diet. Palliative care consulted.  Legal guardianship is in process.  Poor prognosis. Patient remains full code  Subjective: Patient was seen and examined this morning. Sick looking.  Awake, mumbles.  Unable to have meaningful conversation. On dysphagia diet but not interested to eat. Corpak feeding on hold.  ID consulted.  Also on D5 LR drip.  Assessment and plan: Liver cirrhosis with anasarca/ascites Hypoalbuminemia Per history, patient had generalized anasarca and worsening pedal edema since October. Presented with significant ascites and has required paracentesis 6  times so far this admission, last paracentesis on 1/31 with drainage of 3.1 L of hazy yellow fluid. Currently also on metoprolol and Protonix.  He was on diuresis with IV Lasix.  On 2/1 because of significantly elevated sodium.  LFTs as below. Recent Labs  Lab 09/29/22 0346 10/01/22 0314 10/04/22 0352  AST  --   --  79*  ALT  --   --  46*  ALKPHOS  --   --  93  BILITOT  --   --  1.0  PROT  --   --  6.3*  ALBUMIN  --   --  1.8*  AMMONIA  --  133* 40*  INR  --   --  1.4*  PLT 112* 117* 119*    Acute GI bleeding in the setting of liver cirrhosis Acute blood loss anemia. 1/24, patient had an episode of hematochezia, hematemesis.  He feeding was stopped.  GI consultation was obtained.  Patient was started on Protonix. GI bleeding seems to have stabilized.  Hemoglobin roughly stable.  No EGD at this time per GI. Hemoglobin roughly stable Recent Labs    09/10/22 1115 09/10/22 1611 09/11/22 1708 09/27/22 6295 09/28/22 0756 09/29/22 0346 10/01/22 0314 10/04/22 0352  HGB  --   --    < > 9.1* 9.0* 8.6* 10.1* 9.0*  MCV  --   --    < > 104.2* 104.6* 106.6* 107.8* 107.0*  VITAMINB12  --  2,050*  --   --   --   --   --   --   FOLATE >40.0  --   --   --   --   --   --   --    < > =  values in this interval not displayed.    Acute CVA prior CVA with left-sided deficits Primarily brought to the ED after patient was noted to be less interactive compared to her baseline.   MRI showed left acute coronary due to small infarct  Stroke workup completed. Neurology consult appreciated Echo showed EF of 55 to 60% with no cardiac source of embolism A1c 4.5, LDL 74 Because of active bleeding, patient is currently not on any antithrombotic or anticoagulant. Statin not started because of underlying liver cirrhosis Current deficit include expressive aphasia and dysphagia.  Dysphagia Secondary to stroke  Was given core track feeding for about 3 weeks.  Not candidate for PEG tube placement due to  recurrent ascites.  Core track feeding on hold for last 2 days because of recurrent aspiration and tube clogging..  On dysphagia diet. RD consulted. Currently on D5 LR primarily because of hypernatremia Recent Labs  Lab 10/04/22 1607 10/04/22 1939 10/04/22 2339 10/05/22 0457 10/05/22 0813  GLUCAP 92 137* 87 78 73    Aspiration pneumonitis 1/20, patient vomited. Tube feeding was stopped.  Patient probably aspirated as well.  He had an episode of fever. Started presumptively on IV Rocephin this morning. Recent Labs  Lab 09/29/22 0346 10/01/22 0314 10/02/22 0537 10/04/22 0352  WBC 9.2 14.9*  --  9.5  PROCALCITON  --   --  0.29  --     Acute medical encephalopathy Hepatic encephalopathy Ammonia level was elevated, partly contributing to poor mental status.  Started on lactulose. Ammonia level improving. Recent Labs  Lab 10/01/22 0314 10/04/22 0352  AMMONIA 133* 40*    Acute kidney injury Baseline creatinine around 1.  Presented with creatinine of 2.08, peaked at 2.12.  Fluctuating.  Patient has hypokalemia and portal hypertension leading to third spacing, ascites, anasarca but effective arterial hypovolemia and hence low renal perfusion.  Creatinine worsened to 2.03 on 2/1 and hence IV Lasix was stopped.  Currently on fluid. Recent Labs    09/22/22 1100 09/23/22 0335 09/24/22 0328 09/25/22 1020 09/26/22 0326 09/27/22 6962 09/28/22 0756 09/29/22 0346 10/01/22 0314 10/04/22 0352  BUN 61* 65* 66* 72* 71* 69* 65* 60* 59* 73*  CREATININE 1.87* 1.86* 1.66* 1.86* 1.81* 1.77* 1.80* 1.68* 1.84* 2.03*    Hypernatremia Sodium level elevated to 151 on 2/1. Dextrose drip was started.  Continue the same for today.  Repeat labs tomorrow Recent Labs  Lab 09/29/22 0346 10/01/22 0314 10/04/22 0352  NA 146* 145 151*    COPD Continue bronchodilators  BPH Flomax  Impaired mobility PT evaluation obtained.  SNF recommended  Goals of care   Code Status: Full Code.   Poor prognosis.  DSS involved but no legal guardian yet. 1/19, called and updated [facility] for guardianship [name], [phone]).  I asked for advanced directives.  She was going to reach out to her supervisor and get back to me.  I have not heard any thing from her since then. 1/29, palliative care reached out to DSS again.  They were told that petition was filed on 1/29 for guardianship.   Ethics involved.  Remains full code as of now.  Scheduled Meds:  feeding supplement (PROSource TF20)  60 mL Per Tube Daily   fiber supplement (BANATROL TF)  60 mL Per Tube BID   free water  200 mL Per Tube Q4H   lactulose  10 g Per Tube BID   lidocaine (PF)  15 mL Intradermal Once   lidocaine (PF)  5  mL Intradermal Once   metoprolol tartrate  25 mg Per Tube BID   pantoprazole (PROTONIX) IV  40 mg Intravenous Q24H   tamsulosin  0.4 mg Oral Daily    PRN meds: [DISCONTINUED] acetaminophen **OR** acetaminophen (TYLENOL) oral liquid 160 mg/5 mL **OR** acetaminophen, dextrose, Gerhardt's butt cream, ipratropium-albuterol, ondansetron (ZOFRAN) IV, senna-docusate   Infusions:   cefTRIAXone (ROCEPHIN)  IV 1 g (10/05/22 0858)   dextrose 5% lactated ringers 50 mL/hr at 10/03/22 2101   feeding supplement (JEVITY 1.5 CAL/FIBER) 40 mL/hr at 10/03/22 1610    Skin assessment:  Pressure Injury 09/24/22 Heel Left;Posterior Deep Tissue Pressure Injury - Purple or maroon localized area of discolored intact skin or blood-filled blister due to damage of underlying soft tissue from pressure and/or shear. Round, dark brown spot (Active)  09/24/22 0903  Location: Heel  Location Orientation: Left;Posterior  Staging: Deep Tissue Pressure Injury - Purple or maroon localized area of discolored intact skin or blood-filled blister due to damage of underlying soft tissue from pressure and/or shear.  Wound Description (Comments): Round, dark brown spot  Present on Admission: No     Pressure Injury  10/02/22 Thigh Left;Posterior;Proximal Stage 2 -  Partial thickness loss of dermis presenting as a shallow open injury with a red, pink wound bed without slough. Pink (Active)  10/02/22 0700  Location: Thigh  Location Orientation: Left;Posterior;Proximal  Staging: Stage 2 -  Partial thickness loss of dermis presenting as a shallow open injury with a red, pink wound bed without slough.  Wound Description (Comments): Pink  Present on Admission:     Nutritional status:  Body mass index is 20.75 kg/m.  Nutrition Problem: Moderate Malnutrition Etiology: chronic illness Signs/Symptoms: moderate fat depletion, severe muscle depletion     Diet:  Diet Order             DIET - DYS 1 Room service appropriate? No; Fluid consistency: Honey Thick  Diet effective now                   DVT prophylaxis:  SCD's Start: 09/10/22 1605   Antimicrobials: IV Rocephin Fluid: None currently Consultants: Palliative care, IR Family Communication: None at bedside.    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  Aphasia, dysphagia due to stroke.  Poor prognosis.  Unable to put in a PEG tube. Level of care: Telemetry Medical   Dispo: The patient is from: [facility] long-term care              Anticipated d/c is to: Poor prognosis.   Antimicrobials: Anti-infectives (From admission, onward)    Start     Dose/Rate Route Frequency Ordered Stop   10/02/22 1000  cefTRIAXone (ROCEPHIN) 1 g in sodium chloride 0.9 % 100 mL IVPB        1 g 200 mL/hr over 30 Minutes Intravenous Every 24 hours 10/02/22 0855     09/10/22 1345  cefTRIAXone (ROCEPHIN) 1 g in sodium chloride 0.9 % 100 mL IVPB        1 g 200 mL/hr over 30 Minutes Intravenous  Once 09/10/22 1331 09/10/22 1605   09/10/22 1345  azithromycin (ZITHROMAX) 500 mg in sodium chloride 0.9 % 250 mL IVPB        500 mg 250 mL/hr over 60 Minutes Intravenous  Once  09/10/22 1331 09/10/22 1605       Objective: Vitals:   10/05/22 0417 10/05/22 0809  BP: 115/76  118/68  Pulse: 77 83  Resp: 17 20  Temp: 98.4 F (36.9 C) 97.6 F (36.4 C)  SpO2: 100% 100%    Intake/Output Summary (Last 24 hours) at 10/05/2022 1018 Last data filed at 10/05/2022 0558 Gross per 24 hour  Intake 1882.36 ml  Output 300 ml  Net 1582.36 ml    Filed Weights   10/03/22 0315 10/04/22 0401 10/05/22 0500  Weight: 63 kg 59.8 kg 60.1 kg   Weight change: 0.3 kg Body mass index is 20.75 kg/m.   Physical Exam: General exam: Middle-aged African-American male.  Chronically sick looking not in pain. Skin: No rashes, lesions or ulcers. HEENT: Atraumatic, normocephalic, no obvious bleeding.   Lungs: Diminished air entry in both bases.  Moaning but not requiring supplemental oxygen CVS: Regular rate and rhythm, no murmur GI/Abd: Abdomen distended from ascites.  Slightly better today than yesterday.  Nontender to touch.   CNS: Alert, awake, inconsistently follows motor commands.  Unable to have a verbal communication.  Left-sided deficits Extremities: 2+ bilateral pedal edema, no calf tenderness  Data Review: I have personally reviewed the laboratory data and studies available.  F/u labs ordered Unresulted Labs (From admission, onward)     Start     Ordered   10/06/22 0500  CBC with Differential/Platelet  Tomorrow morning,   R       Question:  Specimen collection method  Answer:  Lab=Lab collect   10/05/22 0750   10/06/22 0500  Basic metabolic panel  Tomorrow morning,   R       Question:  Specimen collection method  Answer:  Lab=Lab collect   10/05/22 0750            Total time spent in review of labs and imaging, patient evaluation, formulation of plan, documentation and communication with family: 45 minutes  Signed, Lorin Glass, MD [facility] 10/05/2022

## 2022-10-06 DIAGNOSIS — E870 Hyperosmolality and hypernatremia: Secondary | ICD-10-CM | POA: Diagnosis not present

## 2022-10-06 DIAGNOSIS — E876 Hypokalemia: Secondary | ICD-10-CM

## 2022-10-06 DIAGNOSIS — J690 Pneumonitis due to inhalation of food and vomit: Secondary | ICD-10-CM

## 2022-10-06 DIAGNOSIS — I639 Cerebral infarction, unspecified: Secondary | ICD-10-CM | POA: Diagnosis not present

## 2022-10-06 LAB — GLUCOSE, CAPILLARY
Glucose-Capillary: 112 mg/dL — ABNORMAL HIGH (ref 70–99)
Glucose-Capillary: 99 mg/dL (ref 70–99)
Glucose-Capillary: 96 mg/dL (ref 70–99)
Glucose-Capillary: 78 mg/dL (ref 70–99)
Glucose-Capillary: 99 mg/dL (ref 70–99)

## 2022-10-06 LAB — BASIC METABOLIC PANEL
Anion gap: 11 (ref 5–15)
BUN: 60 mg/dL — ABNORMAL HIGH (ref 8–23)
CO2: 22 mmol/L (ref 22–32)
Calcium: 7.9 mg/dL — ABNORMAL LOW (ref 8.9–10.3)
Chloride: 125 mmol/L — ABNORMAL HIGH (ref 98–111)
Creatinine, Ser: 1.88 mg/dL — ABNORMAL HIGH (ref 0.61–1.24)
GFR, Estimated: 40 mL/min — ABNORMAL LOW (ref >60)
Glucose, Bld: 96 mg/dL (ref 70–99)
Potassium: 3.3 mmol/L — ABNORMAL LOW (ref 3.5–5.1)
Sodium: 158 mmol/L — ABNORMAL HIGH (ref 135–145)

## 2022-10-06 LAB — CBC WITH DIFFERENTIAL/PLATELET
Abs Immature Granulocytes: 0.06 10*3/uL (ref 0.00–0.07)
Basophils Absolute: 0.1 10*3/uL (ref 0.0–0.1)
Basophils Relative: 1 %
Eosinophils Absolute: 0.2 10*3/uL (ref 0.0–0.5)
Eosinophils Relative: 2 %
HCT: 31 % — ABNORMAL LOW (ref 39.0–52.0)
Hemoglobin: 9.8 g/dL — ABNORMAL LOW (ref 13.0–17.0)
Immature Granulocytes: 1 %
Lymphocytes Relative: 15 %
Lymphs Abs: 1.5 10*3/uL (ref 0.7–4.0)
MCH: 34.6 pg — ABNORMAL HIGH (ref 26.0–34.0)
MCHC: 31.6 g/dL (ref 30.0–36.0)
MCV: 109.5 fL — ABNORMAL HIGH (ref 80.0–100.0)
Monocytes Absolute: 1.4 10*3/uL — ABNORMAL HIGH (ref 0.1–1.0)
Monocytes Relative: 13 %
Neutro Abs: 7.2 10*3/uL (ref 1.7–7.7)
Neutrophils Relative %: 68 %
Platelets: 126 10*3/uL — ABNORMAL LOW (ref 150–400)
RBC: 2.83 MIL/uL — ABNORMAL LOW (ref 4.22–5.81)
RDW: 20.6 % — ABNORMAL HIGH (ref 11.5–15.5)
WBC: 10.4 10*3/uL (ref 4.0–10.5)
nRBC: 0 % (ref 0.0–0.2)

## 2022-10-06 MED ORDER — DEXTROSE 5 % IV SOLN: INTRAVENOUS | Status: DC

## 2022-10-06 MED ORDER — FREE WATER
400.0000 mL | Status: DC
  Administered 2022-10-06 – 2022-10-08 (×13): 400 mL

## 2022-10-06 MED ORDER — STERILE WATER FOR INJECTION IJ SOLN
INTRAMUSCULAR | Status: AC
  Administered 2022-10-06: 10 mL
  Filled 2022-10-06: qty 10

## 2022-10-06 MED ORDER — POTASSIUM CHLORIDE 20 MEQ PO PACK
40.0000 meq | PACK | Freq: Once | ORAL | Status: AC
  Administered 2022-10-06: 40 meq
  Filled 2022-10-06: qty 2

## 2022-10-06 NOTE — Progress Notes (Signed)
[facility] PROGRESS NOTE   Jacob Bass ZOX:[id number] DOB: [birth year] DOA: 09/10/2022  PCP: Donia Ast., MD  Brief History/Interval Summary:  62 y.o. male with PMH significant for history of stroke with residual left-sided deficits, HTN, liver cirrhosis, chronic anemia, COPD, recent diagnosis of COVID who lives at [facility]. On 1/8, patient was sent to the ED for altered mental status and weakness.  Last known normal the previous night. MRI brain positive for small acute infarct. Because of stroke, patient has dysphagia, expressive aphasia and worsening of left-sided deficits.  His hospital course has been complicated by recurrent ascites and dysphagia.  He has required multiple paracentesis.  Started on nasogastric tube feedings. Not candidate for PEG tube placement due to recurrent ascites.   Feedings had to be held for 2 days due to recurrent aspirations and tube clogging.  Now on nocturnal feeds.   Palliative care consulted.  Legal guardianship is in process.  Poor prognosis. Patient remains full code  Consultants: Palliative care.    Subjective/Interval History: Patient not very communicative.  This is his baseline at least during this hospital stay.    Assessment/Plan:  Liver cirrhosis with anasarca/ascites Per history, patient had generalized anasarca and worsening pedal edema since October. Presented with significant ascites and has required multiple paracentesis so far during this admission. Last paracentesis borrows on 1/31 with drainage of 3.1 L of hazy yellow liquid. Was on furosemide but currently on hold due to hypernatremia. LFTs have been stable.   Acute GI bleeding in the setting of liver cirrhosis Acute blood loss anemia. On 1/24, patient had an episode of hematochezia, hematemesis.  He feeding was stopped.  GI consultation was obtained.  Patient was started on Protonix. GI bleeding seems to have stabilized.  Hemoglobin roughly stable.  No EGD at  this time per GI. Hemoglobin has been stable.   Acute CVA/Prior CVA with left-sided deficits Primarily brought to the ED after patient was noted to be less interactive compared to her baseline.   MRI showed left acute CVA. Stroke workup completed.  Echo showed EF of 55 to 60% with no cardiac source of embolism A1c 4.5, LDL 74 Because of active bleeding, patient is currently not on any antithrombotic or anticoagulant. Statin not started because of underlying liver cirrhosis Current deficit include expressive aphasia and dysphagia.   Oropharyngeal dysphagia Secondary to stroke  Currently on nocturnal nasogastric tube feedings. A candidate for PEG tube due to recurrent ascites.  Hypernatremia Sodium level noted to be significantly elevated today.  Will increase free water through the NG tube.  Will also switch over his IV fluids to D5 water.  He has been several days out from his acute stroke and D5 should no longer be contraindicated.  Aspiration pneumonitis On 1/20, patient vomited. Tube feeding was stopped.  Patient probably aspirated as well.  He had an episode of fever.  Darted on ceftriaxone.  Noted to be afebrile this morning.  WBC is normal.  Continue to monitor. Respiratory status is stable.   Acute hepatic encephalopathy Ammonia level was elevated, partly contributing to poor mental status.  Started on lactulose. Ammonia level improving.   Acute kidney injury Baseline creatinine around 1.  Presented with creatinine of 2.08, peaked at 2.12.   Renal function is fluctuating.  Continue to monitor urine output.  Avoid nephrotoxic agents.  Furosemide currently on hold.  Replace potassium.     COPD Continue bronchodilators   BPH Flomax   Impaired mobility PT evaluation obtained.  SNF recommended   Goals of care Palliative care is following.  DSS is involved but no legal guardian yet. [facility] for guardianship [name], [phone]). On 1/29,  palliative care reached out to DSS again.  They were told that petition was filed on 1/29 for guardianship.   Ethics involved.  Remains full code as of now.  Moderate calorie malnutrition Nutrition Problem: Moderate Malnutrition Etiology: chronic illness  DVT Prophylaxis: SCDs Code Status: Full code Family Communication: No family at bedside Disposition Plan: SNF when medically stable.  Status is: Inpatient Remains inpatient appropriate because: Oropharyngeal dysphagia requiring NG tube feedings    Medications: Scheduled:  feeding supplement (JEVITY 1.5 CAL/FIBER)  1,000 mL Per Tube Q24H   feeding supplement (PROSource TF20)  60 mL Per Tube Daily   fiber supplement (BANATROL TF)  60 mL Per Tube BID   free water  400 mL Per Tube Q4H   lactulose  10 g Per Tube BID   lidocaine (PF)  15 mL Intradermal Once   lidocaine (PF)  5 mL Intradermal Once   metoprolol tartrate  25 mg Per Tube BID   pantoprazole (PROTONIX) IV  40 mg Intravenous Q24H   tamsulosin  0.4 mg Oral Daily   Continuous:  cefTRIAXone (ROCEPHIN)  IV 1 g (10/06/22 0944)   dextrose 75 mL/hr at 10/06/22 0843   PRN:[DISCONTINUED] acetaminophen **OR** acetaminophen (TYLENOL) oral liquid 160 mg/5 mL **OR** acetaminophen, dextrose, Gerhardt's butt cream, ipratropium-albuterol, ondansetron (ZOFRAN) IV, senna-docusate  Antibiotics: Anti-infectives (From admission, onward)    Start     Dose/Rate Route Frequency Ordered Stop   10/02/22 1000  cefTRIAXone (ROCEPHIN) 1 g in sodium chloride 0.9 % 100 mL IVPB        1 g 200 mL/hr over 30 Minutes Intravenous Every 24 hours 10/02/22 0855     09/10/22 1345  cefTRIAXone (ROCEPHIN) 1 g in sodium chloride 0.9 % 100 mL IVPB        1 g 200 mL/hr over 30 Minutes Intravenous  Once 09/10/22 1331 09/10/22 1605   09/10/22 1345  azithromycin (ZITHROMAX) 500 mg in sodium chloride 0.9 % 250 mL IVPB        500 mg 250 mL/hr over 60 Minutes Intravenous  Once 09/10/22 1331 09/10/22 1605        Objective:  Vital Signs  Vitals:   10/06/22 0042 10/06/22 0402 10/06/22 0428 10/06/22 0723  BP: 132/83 (!) 144/91  133/69  Pulse:  79  79  Resp:  18  18  Temp:  98 F (36.7 C)  97.6 F (36.4 C)  TempSrc:  Oral  Oral  SpO2:  95%    Weight:   58.8 kg   Height:        Intake/Output Summary (Last 24 hours) at 10/06/2022 1035 Last data filed at 10/06/2022 0557 Gross per 24 hour  Intake 260 ml  Output 650 ml  Net -390 ml   Filed Weights   10/04/22 0401 10/05/22 0500 10/06/22 0428  Weight: 59.8 kg 60.1 kg 58.8 kg    General appearance: Awake alert.  In no distress.  Distracted.  Not very communicative. NG tube noted. Resp: Coarse breath sounds bilaterally.  No wheezing or rhonchi. Cardio: S1-S2 is normal regular.  No S3-S4.  No rubs murmurs or bruit GI: Abdomen is soft.  Nontender nondistended.  Bowel sounds are present normal.  No masses organomegaly Awake with eyes open.  Does not answer questions.  No obvious focal neurological deficits noted.  Lab Results:  Data Reviewed: I have personally reviewed following labs and reports of the imaging studies  CBC: Recent Labs  Lab 10/01/22 0314 10/04/22 0352 10/06/22 0306  WBC 14.9* 9.5 10.4  NEUTROABS 12.0* 6.7 7.2  HGB 10.1* 9.0* 9.8*  HCT 31.9* 27.4* 31.0*  MCV 107.8* 107.0* 109.5*  PLT 117* 119* 126*    Basic Metabolic Panel: Recent Labs  Lab 10/01/22 0314 10/04/22 0352 10/06/22 0306  NA 145 151* 158*  K 4.3 3.7 3.3*  CL 114* 118* 125*  CO2 23 23 22   GLUCOSE 119* 94 96  BUN 59* 73* 60*  CREATININE 1.84* 2.03* 1.88*  CALCIUM 7.8* 7.9* 7.9*    GFR: Estimated Creatinine Clearance: 33.4 mL/min (A) (by C-G formula based on SCr of 1.88 mg/dL (H)).  Liver Function Tests: Recent Labs  Lab 10/04/22 0352  AST 79*  ALT 46*  ALKPHOS 93  BILITOT 1.0  PROT 6.3*  ALBUMIN 1.8*    Recent Labs  Lab 10/01/22 0314 10/04/22 0352  AMMONIA 133* 40*    Coagulation Profile: Recent Labs  Lab  10/04/22 0352  INR 1.4*     CBG: Recent Labs  Lab 10/05/22 1637 10/05/22 1945 10/05/22 2319 10/06/22 0358 10/06/22 0733  GLUCAP 93 90 115* 112* 99     Radiology Studies: No results found.     LOS: 25 days   Eartha Vonbehren [employer] on [email or web address]  10/06/2022, 10:35 AM

## 2022-10-06 NOTE — Plan of Care (Signed)
  Problem: Education: Goal: Knowledge of disease or condition will improve Outcome: Progressing   Problem: Ischemic Stroke/TIA Tissue Perfusion: Goal: Complications of ischemic stroke/TIA will be minimized Outcome: Progressing   Problem: Health Behavior/Discharge Planning: Goal: Ability to manage health-related needs will improve Outcome: Progressing

## 2022-10-07 DIAGNOSIS — I639 Cerebral infarction, unspecified: Secondary | ICD-10-CM | POA: Diagnosis not present

## 2022-10-07 DIAGNOSIS — E876 Hypokalemia: Secondary | ICD-10-CM | POA: Diagnosis not present

## 2022-10-07 DIAGNOSIS — E870 Hyperosmolality and hypernatremia: Secondary | ICD-10-CM | POA: Diagnosis not present

## 2022-10-07 DIAGNOSIS — J690 Pneumonitis due to inhalation of food and vomit: Secondary | ICD-10-CM | POA: Diagnosis not present

## 2022-10-07 LAB — BASIC METABOLIC PANEL
Anion gap: 6 (ref 5–15)
BUN: 53 mg/dL — ABNORMAL HIGH (ref 8–23)
CO2: 22 mmol/L (ref 22–32)
Calcium: 7.6 mg/dL — ABNORMAL LOW (ref 8.9–10.3)
Chloride: 119 mmol/L — ABNORMAL HIGH (ref 98–111)
Creatinine, Ser: 1.72 mg/dL — ABNORMAL HIGH (ref 0.61–1.24)
GFR, Estimated: 44 mL/min — ABNORMAL LOW (ref >60)
Glucose, Bld: 106 mg/dL — ABNORMAL HIGH (ref 70–99)
Potassium: 4.1 mmol/L (ref 3.5–5.1)
Sodium: 147 mmol/L — ABNORMAL HIGH (ref 135–145)

## 2022-10-07 LAB — GLUCOSE, CAPILLARY
Glucose-Capillary: 101 mg/dL — ABNORMAL HIGH (ref 70–99)
Glucose-Capillary: 109 mg/dL — ABNORMAL HIGH (ref 70–99)
Glucose-Capillary: 110 mg/dL — ABNORMAL HIGH (ref 70–99)
Glucose-Capillary: 112 mg/dL — ABNORMAL HIGH (ref 70–99)
Glucose-Capillary: 82 mg/dL (ref 70–99)
Glucose-Capillary: 90 mg/dL (ref 70–99)

## 2022-10-07 MED ORDER — STERILE WATER FOR INJECTION IJ SOLN
INTRAMUSCULAR | Status: AC
  Administered 2022-10-07: 10 mL
  Filled 2022-10-07: qty 10

## 2022-10-07 NOTE — Plan of Care (Signed)
  Problem: Nutrition: Goal: Risk of aspiration will decrease Outcome: Progressing   Problem: Nutrition: Goal: Dietary intake will improve Outcome: Progressing   Problem: Nutrition: Goal: Adequate nutrition will be maintained Outcome: Progressing   Problem: Pain Managment: Goal: General experience of comfort will improve Outcome: Progressing   Problem: Safety: Goal: Ability to remain free from injury will improve Outcome: Progressing   Problem: Skin Integrity: Goal: Risk for impaired skin integrity will decrease Outcome: Progressing

## 2022-10-07 NOTE — Progress Notes (Signed)
[facility] PROGRESS NOTE   Jacob Bass WUJ:[id number] DOB: Feb 16, 1961 DOA: 09/10/2022  PCP: Donia Ast., MD  Brief History/Interval Summary:  62 y.o. male with PMH significant for history of stroke with residual left-sided deficits, HTN, liver cirrhosis, chronic anemia, COPD, recent diagnosis of COVID who lives at [facility]. On 1/8, patient was sent to the ED for altered mental status and weakness.  Last known normal the previous night. MRI brain positive for small acute infarct. Because of stroke, patient has dysphagia, expressive aphasia and worsening of left-sided deficits.  His hospital course has been complicated by recurrent ascites and dysphagia.  He has required multiple paracentesis.  Started on nasogastric tube feedings. Not candidate for PEG tube placement due to recurrent ascites.   Feedings had to be held for 2 days due to recurrent aspirations and tube clogging.  Now on nocturnal feeds.   Palliative care consulted.  Legal guardianship is in process.  Poor prognosis. Patient remains full code  Consultants: Palliative care.    Subjective/Interval History: Patient opens his eyes when his name is called but does not really communicate beyond that.     Assessment/Plan:  Liver cirrhosis with anasarca/ascites Per history, patient had generalized anasarca and worsening pedal edema since October. Presented with significant ascites and has required multiple paracentesis so far during this admission. Last paracentesis borrows on 1/31 with drainage of 3.1 L of hazy yellow liquid. Was on furosemide but currently on hold due to hypernatremia. LFTs have been stable. Some distention of the abdomen noted this morning.  Continue to monitor. If sodium level continues to improve then we should be able to resume his furosemide from tomorrow.   Acute GI bleeding in the setting of liver cirrhosis Acute blood loss anemia. On 1/24, patient had an episode of hematochezia,  hematemesis.  He feeding was stopped.  GI consultation was obtained.  Patient was started on Protonix. GI bleeding seems to have stabilized.  Hemoglobin roughly stable.  No EGD at this time per GI. Hemoglobin has been stable.  Recheck labs tomorrow.   Acute CVA/Prior CVA with left-sided deficits Primarily brought to the ED after patient was noted to be less interactive compared to her baseline. MRI showed left acute CVA. Stroke workup completed.  Echo showed EF of 55 to 60% with no cardiac source of embolism A1c 4.5, LDL 74 Because of active bleeding, patient is currently not on any antithrombotic or anticoagulant. Statin not started because of underlying liver cirrhosis Current deficit include expressive aphasia and dysphagia.   Oropharyngeal dysphagia Secondary to stroke  Currently on nocturnal nasogastric tube feedings. Not a candidate for PEG tube due to recurrent ascites.  Hypernatremia Sodium level peaked at 158.  Free water was increased.  Patient was also given D5 infusion.  Sodium level improved to 147 today.  Will decrease the rate of D5.  Recheck labs tomorrow.    Aspiration pneumonitis On 1/20, patient vomited. Tube feeding was stopped.  Patient probably aspirated as well.  He had an episode of fever.  Started on ceftriaxone.   Has been stable for the past 24 hours.  WBC was noted to be normal yesterday.  Respiratory status is stable.     Acute hepatic encephalopathy Ammonia level was elevated, partly contributing to poor mental status.  Started on lactulose. Ammonia level improved.  Recheck labs tomorrow.   Acute kidney injury/hypokalemia Baseline creatinine around 1.  Presented with creatinine of 2.08, peaked at 2.12.  Seems to have plateaued.  Noted  to be 1.72 today. Continue to monitor urine output.  Avoid nephrotoxic agents.  Furosemide currently on hold.  Potassium level has improved.   COPD Continue bronchodilators   BPH Flomax   Impaired mobility PT  evaluation obtained.  SNF recommended   Goals of care Palliative care is following.  DSS is involved but no legal guardian yet. [facility] for guardianship [name], [phone]). On 1/29, palliative care reached out to DSS again.  They were told that petition was filed on 1/29 for guardianship.   Ethics was involved.  Remains full code as of now.  Moderate calorie malnutrition Nutrition Problem: Moderate Malnutrition Etiology: chronic illness  DVT Prophylaxis: SCDs Code Status: Full code Family Communication: No family at bedside Disposition Plan: SNF when medically stable.  Status is: Inpatient Remains inpatient appropriate because: Oropharyngeal dysphagia requiring NG tube feedings    Medications: Scheduled:  feeding supplement (JEVITY 1.5 CAL/FIBER)  1,000 mL Per Tube Q24H   feeding supplement (PROSource TF20)  60 mL Per Tube Daily   fiber supplement (BANATROL TF)  60 mL Per Tube BID   free water  400 mL Per Tube Q4H   lactulose  10 g Per Tube BID   lidocaine (PF)  15 mL Intradermal Once   lidocaine (PF)  5 mL Intradermal Once   metoprolol tartrate  25 mg Per Tube BID   pantoprazole (PROTONIX) IV  40 mg Intravenous Q24H   sterile water (preservative free)       tamsulosin  0.4 mg Oral Daily   Continuous:  cefTRIAXone (ROCEPHIN)  IV 1 g (10/06/22 0944)   dextrose 75 mL/hr at 10/07/22 0029   PRN:[DISCONTINUED] acetaminophen **OR** acetaminophen (TYLENOL) oral liquid 160 mg/5 mL **OR** acetaminophen, dextrose, Gerhardt's butt cream, ipratropium-albuterol, ondansetron (ZOFRAN) IV, senna-docusate, sterile water (preservative free)  Antibiotics: Anti-infectives (From admission, onward)    Start     Dose/Rate Route Frequency Ordered Stop   10/02/22 1000  cefTRIAXone (ROCEPHIN) 1 g in sodium chloride 0.9 % 100 mL IVPB        1 g 200 mL/hr over 30 Minutes Intravenous Every 24 hours 10/02/22 0855     09/10/22 1345  cefTRIAXone (ROCEPHIN) 1 g in sodium  chloride 0.9 % 100 mL IVPB        1 g 200 mL/hr over 30 Minutes Intravenous  Once 09/10/22 1331 09/10/22 1605   09/10/22 1345  azithromycin (ZITHROMAX) 500 mg in sodium chloride 0.9 % 250 mL IVPB        500 mg 250 mL/hr over 60 Minutes Intravenous  Once 09/10/22 1331 09/10/22 1605       Objective:  Vital Signs  Vitals:   10/06/22 1542 10/06/22 2039 10/07/22 0014 10/07/22 0319  BP: (!) 140/77 (!) 126/90 126/81 121/79  Pulse: 77 79 75 (!) 43  Resp: 20 16 16 16   Temp: 97.8 F (36.6 C) 97.8 F (36.6 C) 98.6 F (37 C) 98.6 F (37 C)  TempSrc: Axillary Oral    SpO2:  (!) 86% 98% (!) 84%  Weight:      Height:        Intake/Output Summary (Last 24 hours) at 10/07/2022 0925 Last data filed at 10/06/2022 1800 Gross per 24 hour  Intake 100 ml  Output 2 ml  Net 98 ml    Filed Weights   10/04/22 0401 10/05/22 0500 10/06/22 0428  Weight: 59.8 kg 60.1 kg 58.8 kg    General appearance: Awake alert.  In no distress.  Distracted NG  tube is noted Resp: Coarse breath sounds with crackles at the bases.  No wheezing or rhonchi. Cardio: S1-S2 is normal regular.  No S3-S4.  No rubs murmurs or bruit GI: Abdomen is soft.  Nontender nondistended.  Bowel sounds are present normal.  No masses organomegaly Extremities: Dressing noted over both lower extremities.  Left-sided deficits noted.   Lab Results:  Data Reviewed: I have personally reviewed following labs and reports of the imaging studies  CBC: Recent Labs  Lab 10/01/22 0314 10/04/22 0352 10/06/22 0306  WBC 14.9* 9.5 10.4  NEUTROABS 12.0* 6.7 7.2  HGB 10.1* 9.0* 9.8*  HCT 31.9* 27.4* 31.0*  MCV 107.8* 107.0* 109.5*  PLT 117* 119* 126*     Basic Metabolic Panel: Recent Labs  Lab 10/01/22 0314 10/04/22 0352 10/06/22 0306 10/07/22 0243  NA 145 151* 158* 147*  K 4.3 3.7 3.3* 4.1  CL 114* 118* 125* 119*  CO2 23 23 22 22   GLUCOSE 119* 94 96 106*  BUN 59* 73* 60* 53*  CREATININE 1.84* 2.03* 1.88* 1.72*  CALCIUM 7.8*  7.9* 7.9* 7.6*     GFR: Estimated Creatinine Clearance: 36.6 mL/min (A) (by C-G formula based on SCr of 1.72 mg/dL (H)).  Liver Function Tests: Recent Labs  Lab 10/04/22 0352  AST 79*  ALT 46*  ALKPHOS 93  BILITOT 1.0  PROT 6.3*  ALBUMIN 1.8*     Recent Labs  Lab 10/01/22 0314 10/04/22 0352  AMMONIA 133* 40*     Coagulation Profile: Recent Labs  Lab 10/04/22 0352  INR 1.4*      CBG: Recent Labs  Lab 10/06/22 1549 10/06/22 2041 10/07/22 0014 10/07/22 0317 10/07/22 0712  GLUCAP 78 99 110* 112* 109*      Radiology Studies: No results found.     LOS: 26 days   Julious Langlois [employer] on [email or web address]  10/07/2022, 9:25 AM

## 2022-10-08 ENCOUNTER — Inpatient Hospital Stay (HOSPITAL_COMMUNITY): Payer: Medicaid Other

## 2022-10-08 DIAGNOSIS — N179 Acute kidney failure, unspecified: Secondary | ICD-10-CM | POA: Diagnosis not present

## 2022-10-08 DIAGNOSIS — R1312 Dysphagia, oropharyngeal phase: Secondary | ICD-10-CM

## 2022-10-08 DIAGNOSIS — R188 Other ascites: Secondary | ICD-10-CM

## 2022-10-08 DIAGNOSIS — I639 Cerebral infarction, unspecified: Secondary | ICD-10-CM | POA: Diagnosis not present

## 2022-10-08 LAB — GLUCOSE, CAPILLARY
Glucose-Capillary: 98 mg/dL (ref 70–99)
Glucose-Capillary: 77 mg/dL (ref 70–99)
Glucose-Capillary: 71 mg/dL (ref 70–99)
Glucose-Capillary: 62 mg/dL — ABNORMAL LOW (ref 70–99)
Glucose-Capillary: 90 mg/dL (ref 70–99)

## 2022-10-08 LAB — BASIC METABOLIC PANEL
Anion gap: 8 (ref 5–15)
BUN: 51 mg/dL — ABNORMAL HIGH (ref 8–23)
CO2: 15 mmol/L — ABNORMAL LOW (ref 22–32)
Calcium: 7.4 mg/dL — ABNORMAL LOW (ref 8.9–10.3)
Chloride: 115 mmol/L — ABNORMAL HIGH (ref 98–111)
Creatinine, Ser: 1.71 mg/dL — ABNORMAL HIGH (ref 0.61–1.24)
GFR, Estimated: 44 mL/min — ABNORMAL LOW (ref >60)
Glucose, Bld: 89 mg/dL (ref 70–99)
Potassium: 3.9 mmol/L (ref 3.5–5.1)
Sodium: 138 mmol/L (ref 135–145)

## 2022-10-08 LAB — AMMONIA: Ammonia: 46 umol/L — ABNORMAL HIGH (ref 9–35)

## 2022-10-08 LAB — CBC
HCT: 33.3 % — ABNORMAL LOW (ref 39.0–52.0)
Hemoglobin: 10.2 g/dL — ABNORMAL LOW (ref 13.0–17.0)
MCH: 34.6 pg — ABNORMAL HIGH (ref 26.0–34.0)
MCHC: 30.6 g/dL (ref 30.0–36.0)
MCV: 112.9 fL — ABNORMAL HIGH (ref 80.0–100.0)
Platelets: 109 10*3/uL — ABNORMAL LOW (ref 150–400)
RBC: 2.95 MIL/uL — ABNORMAL LOW (ref 4.22–5.81)
RDW: 19.4 % — ABNORMAL HIGH (ref 11.5–15.5)
WBC: 9.8 10*3/uL (ref 4.0–10.5)
nRBC: 0 % (ref 0.0–0.2)

## 2022-10-08 MED ORDER — FREE WATER
300.0000 mL | Status: DC
  Administered 2022-10-08 – 2022-10-16 (×48): 300 mL

## 2022-10-08 MED ORDER — LATANOPROST 0.005 % OP SOLN
1.0000 [drp] | Freq: Every day | OPHTHALMIC | Status: DC
  Administered 2022-10-08 – 2022-10-25 (×18): 1 [drp] via OPHTHALMIC
  Filled 2022-10-08: qty 2.5

## 2022-10-08 MED ORDER — STERILE WATER FOR INJECTION IJ SOLN
INTRAMUSCULAR | Status: AC
  Administered 2022-10-08: 10 mL
  Filled 2022-10-08: qty 10

## 2022-10-08 MED ORDER — FUROSEMIDE 10 MG/ML IJ SOLN
40.0000 mg | Freq: Every day | INTRAMUSCULAR | Status: DC
  Administered 2022-10-08 – 2022-10-16 (×9): 40 mg via INTRAVENOUS
  Filled 2022-10-08 (×9): qty 4

## 2022-10-08 NOTE — Progress Notes (Signed)
Patient ID: Jacob Bass, male   DOB: 02-Mar-1961, 62 y.o.   MRN: [id number]    Progress Note from the Palliative Medicine Team at [facility]   Patient Name: Jacob Bass        Date: 10/08/2022 DOB: Feb 06, 1961  Age: 62 y.o. MRN#: [id number] Attending Physician: Bonnielee Haff, MD Primary Care Physician: Tyrone Schimke., MD Admit Date: 09/10/2022   Medical records reviewed, assessed patient at bedside  Patient remains with core track for nutritional support secondary to dysphagia.  Currently he is aphasic and inconsistently follows simple commands.  Jacob Bass is a 62 y.o. male who presented to ED from [facility] via EMS with left sided weakness, speech changes, wheezing. PMH/PSH includes prior CVA, subarachnoid hemorrhage, cirrhosis, hep C, hx of etOH abuse, dementia, COPD. Pt known to SLP from prior admissions, with baseline dysarthria and oral dysphagia.    Admitted for treatment and stabilization    Today is day 28 of this hospitalization.  Pending decisions regarding treatment option decisions, advanced directive decisions and anticipatory care needs.  Patient without capacity for medical decision making  Discussed with TOC- still await guardianship with DSS  PMT will sign off at this time, please reconsult when guardianship secured.  No charge    Wadie Lessen NP  Palliative Medicine Team Team Phone # [phone] Pager [phone]

## 2022-10-08 NOTE — Progress Notes (Signed)
Calorie Count Note  48-hour calorie count ordered.  Reviewed tickets that were collected from weekend tracking of PO intake. Pt with varying intake but overall poor and it not meeting needs. Pt currently on nocturnal TF. Would not recommend discontinuing TF completely at this time as pt is only meeting ~30-40% of needs orally. As pt's stamina and PO intake increase, will re-evaluate TF regimen.   Diet: DYS 1, honey thick Supplements: Magic Cup TID on meal trays  Estimated Nutritional Needs:  Kcal:  1900-2100 kcal/d Protein:  90-105g/d Fluid:  2L/d   2/2 Lunch: 321 kcal, 22g protein 2/3 Lunch: 319 kcal, 7g protein 2/3 Dinner: 143 kcal, 3g protein 2/5 Breakfast: 0%, refused  Total intake x 4 meals: 783 kcal (41% of minimum estimated needs)  32 protein (36% of minimum estimated needs)  INTERVENTION:  Continue current diet as ordered, nursing staff to assist with feeding and tray set up Nocturnal tube feeding via cortrak: Jevity 1.5 at 55 ml/h x 12 h (660 ml per day) Prosource TF20 60 ml 1x/d Free water 226mL q4h per MD Provides 1070 kcal, 62 gm protein, 502 ml free water daily (1716mL TF+flush) Monitor guardianship status   NUTRITION DIAGNOSIS:  Moderate Malnutrition related to chronic illness as evidenced by moderate fat depletion, severe muscle depletion. - remains applicable   GOAL:  Patient will meet greater than or equal to 90% of their needs - being met with TF at goal   Ranell Patrick, RD, LDN Clinical Dietitian RD pager # available in AMION  After hours/weekend pager # available in [facility]

## 2022-10-08 NOTE — Plan of Care (Signed)

## 2022-10-08 NOTE — Progress Notes (Addendum)
[facility] PROGRESS NOTE   Jacob Bass WFU:[id number] DOB: 1961/01/16 DOA: 09/10/2022  PCP: Donia Ast., MD  Brief History/Interval Summary:  62 y.o. male with PMH significant for history of stroke with residual left-sided deficits, HTN, liver cirrhosis, chronic anemia, COPD, recent diagnosis of COVID who lives at [facility]. On 1/8, patient was sent to the ED for altered mental status and weakness.  Last known normal the previous night. MRI brain positive for small acute infarct. Because of stroke, patient has dysphagia, expressive aphasia and worsening of left-sided deficits.  His hospital course has been complicated by recurrent ascites and dysphagia.  He has required multiple paracentesis.  Started on nasogastric tube feedings. Not candidate for PEG tube placement due to recurrent ascites.   Feedings had to be held for 2 days due to recurrent aspirations and tube clogging.  Now on nocturnal feeds.   Palliative care consulted.  Legal guardianship is in process.  Poor prognosis. Patient remains full code  Consultants: Palliative care.    Subjective/Interval History: Patient noted to be much more awake and alert today compared to the last few days.  Has difficulty communicating due to aphasia from his stroke.    Assessment/Plan:  Liver cirrhosis with anasarca/ascites Per history, patient had generalized anasarca and worsening pedal edema since October. Presented with significant ascites and has required multiple paracentesis so far during this admission. Last paracentesis borrows on 1/31 with drainage of 3.1 L of hazy yellow liquid. Was on furosemide but currently on hold due to hypernatremia. LFTs have been stable. Abdominal distention appears to be worsening.  Since her sodium level has improved we will resume his furosemide today.  But it is quite likely he will need another paracentesis in the next 2 to 3 days. No imaging studies of the abdomen noted.  Patient has  another medical record.  Last ultrasound of the abdomen was in 2020.  Will order abdominal ultrasound.   Acute GI bleeding in the setting of liver cirrhosis Acute blood loss anemia. On 1/24, patient had an episode of hematochezia, hematemesis.  Tube feeding was stopped.  GI consultation was obtained.  Patient was started on Protonix. GI bleeding seems to have stabilized.  Hemoglobin roughly stable.  No EGD at this time per GI.  Tube feedings were reinitiated. Hemoglobin is stable.  No evidence for overt bleeding.   Acute CVA/Prior CVA with left-sided deficits Primarily brought to the ED after patient was noted to be less interactive compared to her baseline. MRI showed left acute CVA. Stroke workup completed.  Echo showed EF of 55 to 60% with no cardiac source of embolism A1c 4.5, LDL 74 Because of active bleeding, patient is currently not on any antithrombotic or anticoagulant. Statin not started because of underlying liver cirrhosis Current deficit include expressive aphasia and dysphagia.   Oropharyngeal dysphagia Secondary to stroke  Currently on nocturnal nasogastric tube feedings. Not a candidate for PEG tube due to recurrent ascites.  Hypernatremia Sodium level peaked at 158.  Free water was increased.  Patient was also given D5 infusion.   Sodium level has significantly improved.  Will discontinue IV fluids.  Will cut back on free water.    Aspiration pneumonitis On 1/20, patient vomited. Tube feeding was stopped.  Patient probably aspirated as well.  He had an episode of fever.  Started on ceftriaxone.  Respiratory status is stable.  He is afebrile.  WBC is normal. Will plan for 7-day course of ceftriaxone.   Acute hepatic encephalopathy  Ammonia level was initially 99 improved and then worsened again to become 133.  Continue with lactulose.  Ammonia level is 46 today.  Mentation is stable.     Acute kidney injury/hypokalemia Baseline creatinine around 1.  Presented with  creatinine of 2.08, peaked at 2.12.  Seems to have plateaued.  Likely has a new baseline.  Continue to monitor urine output.  Avoid nephrotoxic agents. Furosemide to be resumed today as discussed above.  Check renal function daily for now. Potassium level has improved.   COPD Continue bronchodilators   BPH Flomax   Impaired mobility PT evaluation obtained.  SNF recommended   Goals of care Palliative care is following.  DSS is involved but no legal guardian yet. [facility] for guardianship [name], [phone]). On 1/29, palliative care reached out to DSS again.  They were told that petition was filed on 1/29 for guardianship.   Ethics was involved.  Remains full code as of now.  Moderate calorie malnutrition Nutrition Problem: Moderate Malnutrition Etiology: chronic illness  DVT Prophylaxis: SCDs Code Status: Full code Family Communication: No family at bedside Disposition Plan: SNF when medically stable.  Status is: Inpatient Remains inpatient appropriate because: Oropharyngeal dysphagia requiring NG tube feedings    Medications: Scheduled:  feeding supplement (JEVITY 1.5 CAL/FIBER)  1,000 mL Per Tube Q24H   feeding supplement (PROSource TF20)  60 mL Per Tube Daily   fiber supplement (BANATROL TF)  60 mL Per Tube BID   free water  300 mL Per Tube Q4H   furosemide  40 mg Intravenous Daily   lactulose  10 g Per Tube BID   latanoprost  1 drop Both Eyes QHS   lidocaine (PF)  15 mL Intradermal Once   lidocaine (PF)  5 mL Intradermal Once   metoprolol tartrate  25 mg Per Tube BID   pantoprazole (PROTONIX) IV  40 mg Intravenous Q24H   tamsulosin  0.4 mg Oral Daily   Continuous:  cefTRIAXone (ROCEPHIN)  IV Stopped (10/07/22 1127)   PRN:[DISCONTINUED] acetaminophen **OR** acetaminophen (TYLENOL) oral liquid 160 mg/5 mL **OR** acetaminophen, dextrose, Gerhardt's butt cream, ipratropium-albuterol, ondansetron (ZOFRAN) IV,  senna-docusate  Antibiotics: Anti-infectives (From admission, onward)    Start     Dose/Rate Route Frequency Ordered Stop   10/02/22 1000  cefTRIAXone (ROCEPHIN) 1 g in sodium chloride 0.9 % 100 mL IVPB        1 g 200 mL/hr over 30 Minutes Intravenous Every 24 hours 10/02/22 0855 10/09/22 0959   09/10/22 1345  cefTRIAXone (ROCEPHIN) 1 g in sodium chloride 0.9 % 100 mL IVPB        1 g 200 mL/hr over 30 Minutes Intravenous  Once 09/10/22 1331 09/10/22 1605   09/10/22 1345  azithromycin (ZITHROMAX) 500 mg in sodium chloride 0.9 % 250 mL IVPB        500 mg 250 mL/hr over 60 Minutes Intravenous  Once 09/10/22 1331 09/10/22 1605       Objective:  Vital Signs  Vitals:   10/07/22 2008 10/07/22 2326 10/08/22 0757 10/08/22 0809  BP: (!) 117/58 (!) 113/45 103/76 113/87  Pulse: 64 (!) 110 66 79  Resp: 18 18 18 16   Temp: 97.8 F (36.6 C) 98.7 F (37.1 C) 98 F (36.7 C) 98.4 F (36.9 C)  TempSrc: Oral  Axillary Oral  SpO2: 94% (!) 80% 95% 95%  Weight:      Height:        Intake/Output Summary (Last 24 hours) at 10/08/2022 0957  Last data filed at 10/08/2022 0617 Gross per 24 hour  Intake 1557.42 ml  Output --  Net 1557.42 ml    Filed Weights   10/04/22 0401 10/05/22 0500 10/06/22 0428  Weight: 59.8 kg 60.1 kg 58.8 kg    General appearance: More awake and alert today compared to the last few days.  No distress. Resp: Clear to auscultation bilaterally.  Normal effort Cardio: S1-S2 is normal regular.  No S3-S4.  No rubs murmurs or bruit GI: Abdomen noted to be more distended today compared to yesterday.  Ascites present.  Nontender. Extremities: Mild edema bilateral lower extremities.   Lab Results:  Data Reviewed: I have personally reviewed following labs and reports of the imaging studies  CBC: Recent Labs  Lab 10/04/22 0352 10/06/22 0306 10/08/22 0328  WBC 9.5 10.4 9.8  NEUTROABS 6.7 7.2  --   HGB 9.0* 9.8* 10.2*  HCT 27.4* 31.0* 33.3*  MCV 107.0* 109.5* 112.9*   PLT 119* 126* 109*     Basic Metabolic Panel: Recent Labs  Lab 10/04/22 0352 10/06/22 0306 10/07/22 0243 10/08/22 0328  NA 151* 158* 147* 138  K 3.7 3.3* 4.1 3.9  CL 118* 125* 119* 115*  CO2 23 22 22  15*  GLUCOSE 94 96 106* 89  BUN 73* 60* 53* 51*  CREATININE 2.03* 1.88* 1.72* 1.71*  CALCIUM 7.9* 7.9* 7.6* 7.4*     GFR: Estimated Creatinine Clearance: 36.8 mL/min (A) (by C-G formula based on SCr of 1.71 mg/dL (H)).  Liver Function Tests: Recent Labs  Lab 10/04/22 0352  AST 79*  ALT 46*  ALKPHOS 93  BILITOT 1.0  PROT 6.3*  ALBUMIN 1.8*     Recent Labs  Lab 10/04/22 0352 10/08/22 0329  AMMONIA 40* 46*     Coagulation Profile: Recent Labs  Lab 10/04/22 0352  INR 1.4*      CBG: Recent Labs  Lab 10/07/22 1248 10/07/22 2008 10/07/22 2324 10/08/22 0257 10/08/22 0815  GLUCAP 82 90 101* 98 77      Radiology Studies: No results found.     LOS: 27 days   Jeanette Rauth [employer] on [email or web address]  10/08/2022, 9:57 AM

## 2022-10-09 DIAGNOSIS — R1312 Dysphagia, oropharyngeal phase: Secondary | ICD-10-CM | POA: Diagnosis not present

## 2022-10-09 DIAGNOSIS — N179 Acute kidney failure, unspecified: Secondary | ICD-10-CM | POA: Diagnosis not present

## 2022-10-09 DIAGNOSIS — I639 Cerebral infarction, unspecified: Secondary | ICD-10-CM | POA: Diagnosis not present

## 2022-10-09 DIAGNOSIS — R188 Other ascites: Secondary | ICD-10-CM | POA: Diagnosis not present

## 2022-10-09 LAB — BASIC METABOLIC PANEL
Anion gap: 6 (ref 5–15)
BUN: 58 mg/dL — ABNORMAL HIGH (ref 8–23)
CO2: 18 mmol/L — ABNORMAL LOW (ref 22–32)
Calcium: 7.6 mg/dL — ABNORMAL LOW (ref 8.9–10.3)
Chloride: 113 mmol/L — ABNORMAL HIGH (ref 98–111)
Creatinine, Ser: 1.81 mg/dL — ABNORMAL HIGH (ref 0.61–1.24)
GFR, Estimated: 41 mL/min — ABNORMAL LOW (ref >60)
Glucose, Bld: 94 mg/dL (ref 70–99)
Potassium: 3.7 mmol/L (ref 3.5–5.1)
Sodium: 137 mmol/L (ref 135–145)

## 2022-10-09 LAB — GLUCOSE, CAPILLARY
Glucose-Capillary: 101 mg/dL — ABNORMAL HIGH (ref 70–99)
Glucose-Capillary: 103 mg/dL — ABNORMAL HIGH (ref 70–99)
Glucose-Capillary: 86 mg/dL (ref 70–99)
Glucose-Capillary: 88 mg/dL (ref 70–99)
Glucose-Capillary: 97 mg/dL (ref 70–99)
Glucose-Capillary: 99 mg/dL (ref 70–99)

## 2022-10-09 NOTE — Progress Notes (Signed)
Nutrition Follow-up  DOCUMENTATION CODES:  Non-severe (moderate) malnutrition in context of chronic illness  INTERVENTION:  Continue current diet as ordered, nursing staff to assist with feeding and tray set up Continue nocturnal tube feeding via cortrak: Jevity 1.5 at 55 ml/h x 12 h (660 ml per day) Prosource TF20 60 ml 1x/d Free water 33mL q4h per MD Provides 1070 kcal, 62 gm protein, 502 ml free water daily (2326mL TF+flush) Monitor guardianship status  NUTRITION DIAGNOSIS:  Moderate Malnutrition related to chronic illness as evidenced by moderate fat depletion, severe muscle depletion. - remains applicable  GOAL:  Patient will meet greater than or equal to 90% of their needs - being met with TF at goal  MONITOR:  Diet advancement, Labs, I & O's, TF tolerance  REASON FOR ASSESSMENT:  Consult Assessment of nutrition requirement/status  ASSESSMENT:  Pt with hx of HTN, COPD, cirrhosis, hx of etOH abuse, dementia, and prior CVA presented to ED from his nursing facility with AMS and weakness. Found to have suffered an acute infarct.   1/9 - MBS, NPO 1/14 - paracentesis, 3.8L of clear yellow fluid removed 1/17 - MBS, NPO, SLP signed off on acute treatment, noted that oral phase of swallow is not functional, paracentesis, 5.8L removed 1/21 - paracentesis, 3.5L removed 1/23 - paracentesis, 3.4L removed  1/27 - paracentesis, 4.2L removed 1/30 - MBS, DYS 1 with honey think liquids  Pt resting in bed at the time of assessment. Awake and alert and able to mumble some answers. Encouraged pt to work in increasing his PO intake. Pt underwent calore count and meeting ~40% of his needs orally. Pt likely to remain inpatient for quite awhile as guardianship is obtained. Would recommend leaving cortrak in place for as long as possible while hospitalized to ensure proper nutrition. Will intermittently do calorie counts to determine if PO is improving and rate of nocturnal feeds can be  adjusted.   Average Meal Intake: 1/30-2/6: 54% intake x 11 recorded meals Per kcal count 2/2-2/5, pt meeting ~41% of kcal needs and 36% of protein needs  Nutritionally Relevant Medications: Scheduled Meds:  JEVITY 1.5 CAL/FIBER  1,000 mL Per Tube Q24H   PROSource TF20  60 mL Per Tube Daily   BANATROL TF  60 mL Per Tube BID   free water  300 mL Per Tube Q4H   furosemide  40 mg Intravenous Daily   lactulose  10 g Per Tube BID   pantoprazole (PROTONIX) IV  40 mg Intravenous Q24H   PRN Meds: ondansetron, senna-docusate  Labs Reviewed: Chloride 113 BUN 58, creatinine 1.81 CBG ranges from 62-103 mg/dL over the last 24 hours  NUTRITION - FOCUSED PHYSICAL EXAM: Flowsheet Row Most Recent Value  Orbital Region Mild depletion  Upper Arm Region Moderate depletion  Thoracic and Lumbar Region Moderate depletion  Buccal Region Mild depletion  Temple Region Mild depletion  Clavicle Bone Region Mild depletion  Clavicle and Acromion Bone Region Severe depletion  Scapular Bone Region Moderate depletion  Dorsal Hand Severe depletion  Patellar Region No depletion  Anterior Thigh Region No depletion  Posterior Calf Region No depletion  Edema (RD Assessment) Moderate  [significant edema to the BLE]  Hair Reviewed  Eyes Reviewed  Mouth Reviewed  Skin Reviewed  Nails Reviewed    Diet Order:   Diet Order             DIET - DYS 1 Room service appropriate? No; Fluid consistency: Honey Thick  Diet effective now  EDUCATION NEEDS:  Not appropriate for education at this time  Skin:  Skin Assessment: Reviewed RN Assessment  Last BM:  2/6 - type 6  Height:  Ht Readings from Last 1 Encounters:  09/10/22 5\' 7"  (1.702 m)    Weight:  Wt Readings from Last 1 Encounters:  10/09/22 66.1 kg    Ideal Body Weight:  67.3 kg  BMI:  Body mass index is 22.82 kg/m.  Estimated Nutritional Needs:  Kcal:  1900-2100 kcal/d Protein:  90-105g/d Fluid:  2L/d    Ranell Patrick, RD, LDN Clinical Dietitian RD pager # available in [place]  After hours/weekend pager # available in [facility]

## 2022-10-09 NOTE — Progress Notes (Addendum)
Physical Therapy Treatment Patient Details Name: Jacob Bass MRN: [id number] DOB: 05/04/1961 Today's Date: 10/09/2022   History of Present Illness 62 y.o. male presents to [facility] hospital on 09/10/2022 with AMS and L weakness from [facility]. 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 Complicated by fluid overload. Paracentesis performed 1/14, 1/16, 1/21, 1/27. PMH includes CVA, COPD, cirrhosis, gastric ulcer.    PT Comments    Pt not progressing significantly towards his physical therapy goals. Regressed goals accordingly. He is alert, but mostly unintelligible speech. Follows a few one step commands. Requiring two person maximal assist for bed mobility. Worked on sitting balance EOB to promote upright tolerance. Pt attempting to return himself to bed after period of time sitting up. Continue to recommend SNF for ongoing Physical Therapy.      Recommendations for follow up therapy are one component of a multi-disciplinary discharge planning process, led by the attending physician.  Recommendations may be updated based on patient status, additional functional criteria and insurance authorization.  Follow Up Recommendations  Skilled nursing-short term rehab (<3 hours/day) Can patient physically be transported by private vehicle: No   Assistance Recommended at Discharge Frequent or constant Supervision/Assistance  Patient can return home with the following Two people to help with walking and/or transfers;Two people to help with bathing/dressing/bathroom;Assistance with cooking/housework;Assistance with feeding;Direct supervision/assist for medications management;Direct supervision/assist for financial management;Assist for transportation;Help with stairs or ramp for entrance   Equipment Recommendations  Wheelchair (measurements PT);Wheelchair cushion (measurements PT);Hospital bed;Other (comment) (hoyer lift)    Recommendations for Other Services       Precautions / Restrictions  Precautions Precautions: Fall;Other (comment) Precaution Comments: cortrak Restrictions Weight Bearing Restrictions: No     Mobility  Bed Mobility Overal bed mobility: Needs Assistance Bed Mobility: Supine to Sit, Sit to Supine Rolling: Max assist Sidelying to sit: Max assist, +2 for physical assistance   Sit to supine: Max assist, +2 for physical assistance   General bed mobility comments: Rolling towards left, hand over hand guidance for reaching for bed rail, assist with BLE's and trunk    Transfers                   General transfer comment: Deferred. Pt attempting to return self to bed after period of sitting up on edge of bed    Ambulation/Gait                   Stairs             Wheelchair Mobility    Modified Rankin (Stroke Patients Only) Modified Rankin (Stroke Patients Only) Pre-Morbid Rankin Score: Moderate disability Modified Rankin: Severe disability     Balance Overall balance assessment: Needs assistance Sitting-balance support: Feet supported, No upper extremity supported Sitting balance-Leahy Scale: Poor Sitting balance - Comments: Intermittently minA, however, episodes of significant posterior lean/retropulsion requiring maxA to correct                                    Cognition Arousal/Alertness: Awake/alert Behavior During Therapy: Flat affect Overall Cognitive Status: No family/caregiver present to determine baseline cognitive functioning                                 General Comments: Hard to understand, mostly grunting to yes/no questions with intermittent one-two word responses. Decreased command following  Exercises Other Exercises Other Exercises: Supine: bilateral knee to chest and hamstring stretch (very tight hamstrings bilat) Other Exercises: Sitting EOB: attempted to facilitate anterior weight shift with functional tasks i.e. rubbing lotion on knees, washing face with  RUE    General Comments        Pertinent Vitals/Pain Pain Assessment Pain Assessment: Faces Faces Pain Scale: No hurt    Home Living                          Prior Function            PT Goals (current goals can now be found in the care plan section) Acute Rehab PT Goals Time For Goal Achievement: 10/23/22 Potential to Achieve Goals: Fair    Frequency    Min 2X/week      PT Plan Current plan remains appropriate    Co-evaluation              AM-PAC PT "6 Clicks" Mobility   Outcome Measure  Help needed turning from your back to your side while in a flat bed without using bedrails?: A Lot Help needed moving from lying on your back to sitting on the side of a flat bed without using bedrails?: Total Help needed moving to and from a bed to a chair (including a wheelchair)?: Total Help needed standing up from a chair using your arms (e.g., wheelchair or bedside chair)?: Total Help needed to walk in hospital room?: Total Help needed climbing 3-5 steps with a railing? : Total 6 Click Score: 7    End of Session   Activity Tolerance: Patient limited by fatigue Patient left: in bed;with call bell/phone within reach;with bed alarm set Nurse Communication: Mobility status PT Visit Diagnosis: Other abnormalities of gait and mobility (R26.89);Muscle weakness (generalized) (M62.81);Hemiplegia and hemiparesis Hemiplegia - Right/Left: Left Hemiplegia - dominant/non-dominant: Non-dominant Hemiplegia - caused by: Cerebral infarction     Time: 5462-7035 PT Time Calculation (min) (ACUTE ONLY): 24 min  Charges:  $Therapeutic Activity: 23-37 mins                     Lillia Pauls, PT, DPT Acute Rehabilitation Services Office [phone]    [name] 10/09/2022, 4:58 PM

## 2022-10-09 NOTE — Progress Notes (Signed)
Occupational Therapy Treatment Patient Details Name: Jacob Bass MRN: [id number] DOB: Feb 25, 1961 Today's Date: 10/09/2022   History of present illness 62 y.o. male presents to [facility] hospital on 09/10/2022 with AMS and L weakness from [facility]. 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 Complicated by fluid overload. Paracentesis performed 1/14, 1/16, 1/21, 1/27. PMH includes CVA, COPD, cirrhosis, gastric ulcer.   OT comments  Pt with no progress towards OT goals, downgraded goals accordingly. Pt with inconsistent progress towards functional goals with waxing/waning cognition. Pt requires extensive +2 assist for bed mobility and Total A x 2 for standing trials in [place]. Planned to transfer to chair (with pt agreeable) though no maximove lift pads on unit and no chair alarm box in room so returned pt back to bed. Pt requires Max A for self feeding and Total A for bed level bathing, dressing and toileting. Continue to rec DC back to SNF. May consider reducing OT frequency or DC from acute level if progress not noted.   Recommendations for follow up therapy are one component of a multi-disciplinary discharge planning process, led by the attending physician.  Recommendations may be updated based on patient status, additional functional criteria and insurance authorization.    Follow Up Recommendations  Skilled nursing-short term rehab (<3 hours/day)     Assistance Recommended at Discharge Frequent or constant Supervision/Assistance  Patient can return home with the following  Two people to help with walking and/or transfers;Two people to help with bathing/dressing/bathroom   Equipment Recommendations  None recommended by OT    Recommendations for Other Services      Precautions / Restrictions Precautions Precautions: Fall;Other (comment) Precaution Comments: cortrak Restrictions Weight Bearing Restrictions: No       Mobility Bed Mobility Overal bed mobility: Needs Assistance Bed  Mobility: Supine to Sit, Sit to Supine     Supine to sit: Mod assist, +2 for physical assistance Sit to supine: Max assist   General bed mobility comments: assisting with reaching to bedrails to lift trunk, assist for BLE to EOB. Max A for return to bed    Transfers Overall transfer level: Needs assistance Equipment used: Ambulation equipment used Transfers: Sit to/from Stand Sit to Stand: Total assist, +2 physical assistance           General transfer comment: Initially pushing on [place] bar instead of pulling so unable to get fully up on first two trials. With bed elevated and cues to pull, pt able to get into [place]. Planned to transfer to chair though no maximove lift pads on unit and no green chair alarm box in room so unable to get pt to chair     Balance Overall balance assessment: Needs assistance Sitting-balance support: Feet supported, No upper extremity supported Sitting balance-Leahy Scale: Fair     Standing balance support: Bilateral upper extremity supported Standing balance-Leahy Scale: Zero                             ADL either performed or assessed with clinical judgement   ADL Overall ADL's : Needs assistance/impaired Eating/Feeding: Maximal assistance;Bed level                                     General ADL Comments: Focus on EOB attempts    Extremity/Trunk Assessment Upper Extremity Assessment Upper Extremity Assessment: LUE deficits/detail LUE Deficits / Details: profound weakness  w/ hx of CVA. fluctuating tone in hands though some tone with elbow ROM; unable to perform full elbow extension (lacking ~15 degrees). LUE Coordination: decreased fine motor;decreased gross motor   Lower Extremity Assessment Lower Extremity Assessment: Defer to PT evaluation        Vision   Vision Assessment?: Vision impaired- to be further tested in functional context Additional Comments: questionable L visual inattention, unsure if  baseline or not   Perception     Praxis      Cognition Arousal/Alertness: Awake/alert Behavior During Therapy: Flat affect Overall Cognitive Status: No family/caregiver present to determine baseline cognitive functioning                                 General Comments: Hard to understand, mostly grunting to yes/no questions with intermittent one-two word responses. Follows commands approx 60% of the time, slower processing        Exercises      Shoulder Instructions       General Comments      Pertinent Vitals/ Pain       Pain Assessment Pain Assessment: No/denies pain  Home Living                                          Prior Functioning/Environment              Frequency  Min 2X/week        Progress Toward Goals  OT Goals(current goals can now be found in the care plan section)  Progress towards OT goals: OT to reassess next treatment  Acute Rehab OT Goals Patient Stated Goal: none stated OT Goal Formulation: Patient unable to participate in goal setting Time For Goal Achievement: 10/23/22 Potential to Achieve Goals: Fair  Plan Discharge plan remains appropriate;Frequency remains appropriate    Co-evaluation                 AM-PAC OT "6 Clicks" Daily Activity     Outcome Measure   Help from another person eating meals?: A Lot Help from another person taking care of personal grooming?: A Lot Help from another person toileting, which includes using toliet, bedpan, or urinal?: Total Help from another person bathing (including washing, rinsing, drying)?: Total Help from another person to put on and taking off regular upper body clothing?: Total Help from another person to put on and taking off regular lower body clothing?: Total 6 Click Score: 8    End of Session Equipment Utilized During Treatment: Gait belt  OT Visit Diagnosis: Unsteadiness on feet (R26.81);Other abnormalities of gait and mobility  (R26.89);Muscle weakness (generalized) (M62.81)   Activity Tolerance Patient limited by fatigue   Patient Left in bed;with call bell/phone within reach;with bed alarm set   Nurse Communication Mobility status;Other (comment) (no lift pads and no chair alarm box in room)        Time: [phone] OT Time Calculation (min): 26 min  Charges: OT General Charges $OT Visit: 1 Visit OT Treatments $Self Care/Home Management : 8-22 mins $Therapeutic Activity: 8-22 mins  Bradd Canary, OTR/L Acute Rehab Services Office: [phone]   Lorre Munroe 10/09/2022, 10:22 AM

## 2022-10-09 NOTE — Consult Note (Signed)
[facility] Nurse wound follow up Wound type: Deep tissue pressure injury; left heel Measurement: 1cm x 1cm; dark purple; resolving, not open  Wound bed: intact  Drainage (amount, consistency, odor) none Periwound: intact  Dressing procedure/placement/frequency: Continue silicone foam and offloading with Prevalon boot I placed Prevalon boots back on patient while in the room I have ordered new Prevalon boot due to one in room being non functional.  Monitor for pressure on the plantar surface of the foot due to footboard.   Removed ACE wraps from ankles, no therapeutic needs   WOC Nurse team will follow with you and see patient within 10 days for wound assessments.  Please notify [facility] nurses of any acute changes in the wounds or any new areas of concern [place] MSN, [place], [place], [place]

## 2022-10-09 NOTE — Progress Notes (Signed)
[facility] PROGRESS NOTE   Jacob Bass WUJ:[id number] DOB: 13-Jan-1961 DOA: 09/10/2022  PCP: Donia Ast., MD  Brief History/Interval Summary:  62 y.o. male with PMH significant for history of stroke with residual left-sided deficits, HTN, liver cirrhosis, chronic anemia, COPD, recent diagnosis of COVID who lives at [facility]. On 1/8, patient was sent to the ED for altered mental status and weakness.  Last known normal the previous night. MRI brain positive for small acute infarct. Because of stroke, patient has dysphagia, expressive aphasia and worsening of left-sided deficits.  His hospital course has been complicated by recurrent ascites and dysphagia.  He has required multiple paracentesis.  Started on nasogastric tube feedings. Not candidate for PEG tube placement due to recurrent ascites.   Feedings had to be held for 2 days due to recurrent aspirations and tube clogging.  Now on nocturnal feeds.   Palliative care consulted.  Legal guardianship is in process.  Poor prognosis. Patient remains full code  Consultants: Palliative care.    Subjective/Interval History: Patient is awake alert.  Difficult to communicate due to his aphasia.    Assessment/Plan:  Liver cirrhosis with anasarca/ascites/history of hepatitis C and alcohol abuse Per history, patient had generalized anasarca and worsening pedal edema since October. Presented with significant ascites and has required multiple paracentesis so far during this admission. Last paracentesis borrows on 1/31 with drainage of 3.1 L of hazy yellow liquid. LFTs have been stable. Furosemide was reinitiated yesterday. However his abdomen is starting to get more distended.  He will likely need another paracentesis before the end of the week. Continue to monitor for now. Ultrasound showed cirrhotic appearance of liver. Abdominal distention appears to be worsening.  Since her sodium level has improved we will resume his furosemide  today.  But it is quite likely he will need another paracentesis in the next 2 to 3 days. Patient with previous history of alcohol abuse and history of hepatitis C.  Please note that patient has another medical record MRN [id number].   Acute GI bleeding in the setting of liver cirrhosis Acute blood loss anemia. On 1/24, patient had an episode of hematochezia, hematemesis.  Tube feeding was stopped.  GI consultation was obtained.  Patient was started on Protonix. GI bleeding seems to have stabilized.  Hemoglobin roughly stable.  No EGD at this time per GI.  Tube feedings were reinitiated. Hemoglobin is stable.  No evidence for overt bleeding.   Acute CVA/Prior CVA with left-sided deficits Primarily brought to the ED after patient was noted to be less interactive compared to her baseline. MRI showed left acute CVA. Stroke workup completed.  Echo showed EF of 55 to 60% with no cardiac source of embolism A1c 4.5, LDL 74 Because of active bleeding, patient is currently not on any antithrombotic or anticoagulant. Statin not started because of underlying liver cirrhosis Current deficit include expressive aphasia and dysphagia.  Still has left-sided weakness.   Oropharyngeal dysphagia Secondary to stroke  Currently on nocturnal nasogastric tube feedings. Not a candidate for PEG tube due to recurrent ascites.  Hypoglycemia Noted to have low glucose level yesterday.  Patient currently on nocturnal feedings.  Monitor CBGs.  Not on any insulin coverage at this time.  Hypernatremia Sodium level peaked at 158.  Free water was increased.  Patient was also given D5 infusion.   Sodium level has significantly improved.  IV fluids were discontinued.  Continue with free water for now.     Aspiration pneumonitis On 1/20,  patient vomited. Tube feeding was stopped.  Patient probably aspirated as well.  He had an episode of fever.  He was given a 7-day course of ceftriaxone. Respiratory status is stable.    Acute hepatic encephalopathy Ammonia level was initially 99 improved and then worsened again to become 133.  Continue with lactulose.   Ammonia level 46 on 2/5.  Mentation is stable.     Acute kidney injury/hypokalemia Baseline creatinine around 1.  Presented with creatinine of 2.08, peaked at 2.12.  Seems to have plateaued.  Likely has a new baseline.   Continue to monitor urine output.  Avoid nephrotoxic agents. Torsemide was resumed yesterday.  Creatinine noted to be slightly higher today.  Continue to monitor labs daily. Potassium level has improved.   COPD Continue bronchodilators   BPH Flomax   Impaired mobility PT evaluation obtained.  SNF recommended   Goals of care Palliative care is following.  DSS is involved but no legal guardian yet. [facility] for guardianship [name], [phone]). On 1/29, palliative care reached out to DSS again.  They were told that petition was filed on 1/29 for guardianship.   Ethics was involved.  Remains full code as of now.  Moderate calorie malnutrition Nutrition Problem: Moderate Malnutrition Etiology: chronic illness  DVT Prophylaxis: SCDs Code Status: Full code Family Communication: No family at bedside Disposition Plan: SNF when medically stable.  Status is: Inpatient Remains inpatient appropriate because: Oropharyngeal dysphagia requiring NG tube feedings    Medications: Scheduled:  feeding supplement (JEVITY 1.5 CAL/FIBER)  1,000 mL Per Tube Q24H   feeding supplement (PROSource TF20)  60 mL Per Tube Daily   fiber supplement (BANATROL TF)  60 mL Per Tube BID   free water  300 mL Per Tube Q4H   furosemide  40 mg Intravenous Daily   lactulose  10 g Per Tube BID   latanoprost  1 drop Both Eyes QHS   lidocaine (PF)  15 mL Intradermal Once   lidocaine (PF)  5 mL Intradermal Once   metoprolol tartrate  25 mg Per Tube BID   pantoprazole (PROTONIX) IV  40 mg Intravenous Q24H   tamsulosin  0.4 mg Oral  Daily   Continuous:   PRN:[DISCONTINUED] acetaminophen **OR** acetaminophen (TYLENOL) oral liquid 160 mg/5 mL **OR** acetaminophen, dextrose, Gerhardt's butt cream, ipratropium-albuterol, ondansetron (ZOFRAN) IV, senna-docusate  Antibiotics: Anti-infectives (From admission, onward)    Start     Dose/Rate Route Frequency Ordered Stop   10/02/22 1000  cefTRIAXone (ROCEPHIN) 1 g in sodium chloride 0.9 % 100 mL IVPB        1 g 200 mL/hr over 30 Minutes Intravenous Every 24 hours 10/02/22 0855 10/08/22 1311   09/10/22 1345  cefTRIAXone (ROCEPHIN) 1 g in sodium chloride 0.9 % 100 mL IVPB        1 g 200 mL/hr over 30 Minutes Intravenous  Once 09/10/22 1331 09/10/22 1605   09/10/22 1345  azithromycin (ZITHROMAX) 500 mg in sodium chloride 0.9 % 250 mL IVPB        500 mg 250 mL/hr over 60 Minutes Intravenous  Once 09/10/22 1331 09/10/22 1605       Objective:  Vital Signs  Vitals:   10/09/22 0026 10/09/22 0412 10/09/22 0500 10/09/22 0744  BP: 113/64 116/67  104/69  Pulse: 68 75  71  Resp: 16 17  17   Temp: 98 F (36.7 C) 98.2 F (36.8 C)  (!) 97.5 F (36.4 C)  TempSrc: Oral Oral  Axillary  SpO2: 100% 100%  100%  Weight:   66.1 kg   Height:        Intake/Output Summary (Last 24 hours) at 10/09/2022 0931 Last data filed at 10/08/2022 2000 Gross per 24 hour  Intake 440 ml  Output 400 ml  Net 40 ml    Filed Weights   10/05/22 0500 10/06/22 0428 10/09/22 0500  Weight: 60.1 kg 58.8 kg 66.1 kg    General appearance: Awake alert.  In no distress NG tube is noted. Resp: Clear to auscultation bilaterally.  Normal effort Cardio: S1-S2 is normal regular.  No S3-S4.  No rubs murmurs or bruit GI: Abdomen is distended with ascites.  Nontender.  Bowel sounds present.  Extremities: No edema.   Left Hemiparesis.   Lab Results:  Data Reviewed: I have personally reviewed following labs and reports of the imaging studies  CBC: Recent Labs  Lab 10/04/22 0352 10/06/22 0306  10/08/22 0328  WBC 9.5 10.4 9.8  NEUTROABS 6.7 7.2  --   HGB 9.0* 9.8* 10.2*  HCT 27.4* 31.0* 33.3*  MCV 107.0* 109.5* 112.9*  PLT 119* 126* 109*     Basic Metabolic Panel: Recent Labs  Lab 10/04/22 0352 10/06/22 0306 10/07/22 0243 10/08/22 0328 10/09/22 0611  NA 151* 158* 147* 138 137  K 3.7 3.3* 4.1 3.9 3.7  CL 118* 125* 119* 115* 113*  CO2 23 22 22  15* 18*  GLUCOSE 94 96 106* 89 94  BUN 73* 60* 53* 51* 58*  CREATININE 2.03* 1.88* 1.72* 1.71* 1.81*  CALCIUM 7.9* 7.9* 7.6* 7.4* 7.6*     GFR: Estimated Creatinine Clearance: 39.1 mL/min (A) (by C-G formula based on SCr of 1.81 mg/dL (H)).  Liver Function Tests: Recent Labs  Lab 10/04/22 0352  AST 79*  ALT 46*  ALKPHOS 93  BILITOT 1.0  PROT 6.3*  ALBUMIN 1.8*     Recent Labs  Lab 10/04/22 0352 10/08/22 0329  AMMONIA 40* 46*     Coagulation Profile: Recent Labs  Lab 10/04/22 0352  INR 1.4*      CBG: Recent Labs  Lab 10/08/22 1600 10/08/22 2007 10/09/22 0031 10/09/22 0418 10/09/22 0749  GLUCAP 62* 90 101* 99 103*      Radiology Studies: US Abdomen Complete  Result Date: 10/08/2022 CLINICAL DATA:  [id number] Ascites [phone] AKI (acute kidney injury) [facility]) [id number] Cirrhosis (HCC) [id number] EXAM: ABDOMEN ULTRASOUND COMPLETE COMPARISON:  July 01, 2022 October 12, 2018 FINDINGS: Gallbladder: No gallstones visualized. Gallbladder is decompressed wall is diffusely thickened, consistent with fluid overload state. No sonographic Murphy sign noted by sonographer. Common bile duct: Diameter: Visualized portion measures 3 mm, within normal limits Liver: No focal lesion identified. Coarsened increased hepatic parenchymal echogenicity. Nodular liver contours. Portal vein is grossly patent on color Doppler imaging with normal direction of blood flow towards the liver. IVC: Not visualized secondary to shadowing bowel gas and ascites Pancreas: Not visualized secondary to shadowing bowel gas. Spleen: Size  and appearance within normal limits. Right Kidney: Length: 8.0 cm. Echogenicity appears mildly increased. No mass or hydronephrosis visualized. Left Kidney: Length: 8.2 cm. Echogenicity appears mildly increased. No mass or hydronephrosis visualized. Abdominal aorta: Not visualized secondary to shadowing bowel gas. Other findings: Large volume ascites. IMPRESSION: 1. Cirrhotic liver morphology without focal lesion identified sonographically. 2. Large volume ascites. 3. Gallbladder wall thickening is consistent with fluid overload state. 4. Mildly increased echogenicity of the bilateral kidneys could reflect underlying medical renal disease versus artifact secondary to extensive ascites. No  hydronephrosis. Electronically Signed   By: Meda Klinefelter M.D.   On: 10/08/2022 15:17       LOS: 28 days   Jacob Bass on [email or web address]  10/09/2022, 9:31 AM

## 2022-10-10 DIAGNOSIS — I639 Cerebral infarction, unspecified: Secondary | ICD-10-CM | POA: Diagnosis not present

## 2022-10-10 LAB — GLUCOSE, CAPILLARY
Glucose-Capillary: 89 mg/dL (ref 70–99)
Glucose-Capillary: 36 mg/dL — CL (ref 70–99)
Glucose-Capillary: 83 mg/dL (ref 70–99)
Glucose-Capillary: 94 mg/dL (ref 70–99)
Glucose-Capillary: 75 mg/dL (ref 70–99)
Glucose-Capillary: 95 mg/dL (ref 70–99)
Glucose-Capillary: 78 mg/dL (ref 70–99)

## 2022-10-10 LAB — BASIC METABOLIC PANEL
Anion gap: 9 (ref 5–15)
BUN: 64 mg/dL — ABNORMAL HIGH (ref 8–23)
CO2: 18 mmol/L — ABNORMAL LOW (ref 22–32)
Calcium: 7.5 mg/dL — ABNORMAL LOW (ref 8.9–10.3)
Chloride: 110 mmol/L (ref 98–111)
Creatinine, Ser: 1.84 mg/dL — ABNORMAL HIGH (ref 0.61–1.24)
GFR, Estimated: 41 mL/min — ABNORMAL LOW (ref >60)
Glucose, Bld: 114 mg/dL — ABNORMAL HIGH (ref 70–99)
Potassium: 3.8 mmol/L (ref 3.5–5.1)
Sodium: 137 mmol/L (ref 135–145)

## 2022-10-10 NOTE — Progress Notes (Signed)
Speech Language Pathology Treatment: Dysphagia  Patient Details Name: Jacob Bass MRN: [id number] DOB: 07-05-1961 Today's Date: 10/10/2022 Time: [id number] SLP Time Calculation (min) (ACUTE ONLY): 13 min  Assessment / Plan / Recommendation Clinical Impression  Jacob Bass needed increased assist today for feeding and cueing as he was a bit more distracted and delayed processing although adequately alert. He was able to start out self feeding but needed SLP to give additional teaspoon trials of pudding and help tip cup with honey thick juice. His oral propulsion was more delayed as well which may have contributed to a possible pharyngeal swallow delay with first sip honey resulting in immediate cough with suspected airway compromise, however cough was strong. For the remainder of the session he appeared to tolerate small bites and sips with intermittent cues to move tongue and swallow. Oral or pharyngeal residue suspected as he had multiple, delayed swallows. Recommend continue puree, honey thick liquids and ST. Pt's frequency will be decreased to minimum of 1 time a week.   HPI HPI: Jacob Bass is a 62 y.o. male who presented to ED from [facility] via EMS with left sided weakness, speech changes, wheezing. PMH/PSH includes prior CVA, subarachnoid hemorrhage, cirrhosis, hep C, hx of etOH abuse, dementia, COPD.  Pt known to SLP from prior admissions, with baseline dysarthria and oral dysphagia. He was most recently seen by SLP on 04/13/22 with recommendations for Dysphagia 1 solids and thin liquids. NPO with Cortrak, can't get PEG due to acities. ST has seen him for therapy consistently with improvements and he is now appropriate for his second MBS.      SLP Plan  Continue with current plan of care      Recommendations for follow up therapy are one component of a multi-disciplinary discharge planning process, led by the attending physician.  Recommendations may be updated based on patient status,  additional functional criteria and insurance authorization.    Recommendations  Diet recommendations: Dysphagia 1 (puree);Honey-thick liquid Liquids provided via: Cup Medication Administration: Crushed with puree Supervision: Patient able to self feed;Staff to assist with self feeding;Full supervision/cueing for compensatory strategies Compensations: Minimize environmental distractions;Slow rate;Small sips/bites;Lingual sweep for clearance of pocketing Postural Changes and/or Swallow Maneuvers: Seated upright 90 degrees                Oral Care Recommendations: Oral care BID Follow Up Recommendations: Skilled nursing-short term rehab (<3 hours/day) Assistance recommended at discharge: Frequent or constant Supervision/Assistance SLP Visit Diagnosis: Dysphagia, oropharyngeal phase (R13.12) Plan: Continue with current plan of care           [place] Siren  10/10/2022, 10:00 AM

## 2022-10-10 NOTE — TOC Progression Note (Signed)
Transition of Care [facility]) - Progression Note    Patient Details  Name: Jacob Bass MRN: [id number] Date of Birth: [birth year]  Transition of Care [facility]) CM/SW [place], [place] Phone Number: 10/10/2022, 3:10 PM  Clinical Narrative:   CSW contacted by Lavella Hammock with DSS that guardianship hearing for patient will be held tomorrow, requesting additional medical records for hearing. CSW compiled records and sent to justify need for guardian. CSW to follow.         Expected Discharge Plan and Services                                               Social Determinants of Health (SDOH) Interventions    Readmission Risk Interventions     No data to display

## 2022-10-10 NOTE — Progress Notes (Signed)
[facility] PROGRESS NOTE   Cisco Alfonse Ras BJY:[id number] DOB: Jan 09, 1961 DOA: 09/10/2022  PCP: Donia Ast., MD  Brief History/Interval Summary:  62 y.o. male with PMH significant for history of stroke with residual left-sided deficits, HTN, liver cirrhosis, chronic anemia, COPD, recent diagnosis of COVID who lives at [facility]. On 1/8, patient was sent to the ED for altered mental status and weakness.  Last known normal the previous night. MRI brain positive for small acute infarct. Because of stroke, patient has dysphagia, expressive aphasia and worsening of left-sided deficits.  His hospital course has been complicated by recurrent ascites and dysphagia.  He has required multiple paracentesis.  Started on nasogastric tube feedings. Not candidate for PEG tube placement due to recurrent ascites.   Feedings had to be held for 2 days due to recurrent aspirations and tube clogging.  Now on nocturnal feeds.   Palliative care consulted.  Legal guardianship is in process.  Poor prognosis. Patient remains full code  Consultants: Palliative care.  Subjective/Interval History: Patient is awake, alert, follows some simple commands and replies with one-word answers but difficult to understand, unable to assess orientation.  Review of systems limited.  Assessment/Plan:  Liver cirrhosis with anasarca/ascites/history of hepatitis C and alcohol abuse Per history, patient had generalized anasarca and worsening pedal edema since October. Presented with significant ascites and has required multiple paracentesis so far during this admission. Last paracentesis borrows on 1/31 with drainage of 3.1 L of hazy yellow liquid. LFTs have been stable. Furosemide ongoing, consider repeat paracentesis pending volume status in the next 24 to 48 hours Ultrasound showed cirrhotic appearance of liver. Patient with previous history of alcohol abuse and history of hepatitis C.  Please note that patient has  another medical record MRN [id number].   Acute GI bleeding in the setting of liver cirrhosis, resolved Acute blood loss anemia, stable On 1/24, patient had an episode of hematochezia, hematemesis.  Tube feeding was stopped.  GI consultation was obtained.  Patient was started on Protonix. GI bleeding seems to have stabilized.  Hemoglobin roughly stable.  No EGD at this time per GI.  Tube feedings were reinitiated. Hemoglobin is stable.  No evidence for overt bleeding.   Acute CVA/Prior CVA with left-sided deficits Primarily brought to the ED after patient was noted to be less interactive compared to her baseline. MRI showed left acute CVA. Stroke workup completed.  Echo showed EF of 55 to 60% with no cardiac source of embolism A1c 4.5, LDL 74 Because of active bleeding, patient is currently not on any antithrombotic or anticoagulant. Statin not started because of underlying liver cirrhosis Current deficit include expressive aphasia and dysphagia.  Still has left-sided weakness.   Oropharyngeal dysphagia Secondary to stroke  Currently on nocturnal nasogastric tube feedings. Not a candidate for PEG tube due to recurrent ascites.  Hypoglycemia, chronic Continues to have hypoglycemia in the morning despite nocturnal feeds, patient is not on any insulin or diabetic medications that could cause his recurrent hypoglycemia. Will discuss with dietary, may need to increase tube feeds overnight to offset his hypoglycemia.  Hypernatremia, resolved Sodium level peaked at 158 Continue free water flushes per dietary, currently well-controlled  Aspiration pneumonitis versus pneumonia On 1/20, patient vomited. Tube feeding was stopped.   Completed ceftriaxone - Respiratory status is stable.   Acute hepatic encephalopathy, improved Ammonia level was initially 99 improved and then worsened again to become 133.  Continue with lactulose.   Ammonia level 46 on 2/5.  Mentation is  stable and likely at  baseline.     Acute kidney injury/hypokalemia, resolving Baseline creatinine around 1.   Presented with creatinine of 2.08, peaked at 2.12.  Creatinine likely at new baseline around 1.9 Continue torsemide   COPD Without acute exacerbation  Continue bronchodilators   BPH Flomax   Impaired mobility PT evaluation obtained.  SNF recommended   Goals of care Palliative care is following.  DSS is involved but no legal guardian yet. [facility] for guardianship [name], [phone]). On 1/29, palliative care reached out to DSS again.  They were told that petition was filed on 1/29 for guardianship.   Ethics was involved.  Remains full code as of now.  Moderate calorie malnutrition Nutrition Problem: Moderate Malnutrition Etiology: chronic illness  DVT Prophylaxis: SCDs Code Status: Full code Family Communication: None available, no legal guardian Disposition Plan: SNF when medically stable.  Status is: Inpatient Remains inpatient appropriate because: Oropharyngeal dysphagia requiring NG tube feedings  Medications: Scheduled:  feeding supplement (JEVITY 1.5 CAL/FIBER)  1,000 mL Per Tube Q24H   feeding supplement (PROSource TF20)  60 mL Per Tube Daily   fiber supplement (BANATROL TF)  60 mL Per Tube BID   free water  300 mL Per Tube Q4H   furosemide  40 mg Intravenous Daily   lactulose  10 g Per Tube BID   latanoprost  1 drop Both Eyes QHS   lidocaine (PF)  15 mL Intradermal Once   lidocaine (PF)  5 mL Intradermal Once   metoprolol tartrate  25 mg Per Tube BID   pantoprazole (PROTONIX) IV  40 mg Intravenous Q24H   tamsulosin  0.4 mg Oral Daily   Continuous:   PRN:[DISCONTINUED] acetaminophen **OR** acetaminophen (TYLENOL) oral liquid 160 mg/5 mL **OR** acetaminophen, dextrose, Gerhardt's butt cream, ipratropium-albuterol, ondansetron (ZOFRAN) IV, senna-docusate  Antibiotics: Anti-infectives (From admission, onward)    Start     Dose/Rate Route  Frequency Ordered Stop   10/02/22 1000  cefTRIAXone (ROCEPHIN) 1 g in sodium chloride 0.9 % 100 mL IVPB        1 g 200 mL/hr over 30 Minutes Intravenous Every 24 hours 10/02/22 0855 10/08/22 1311   09/10/22 1345  cefTRIAXone (ROCEPHIN) 1 g in sodium chloride 0.9 % 100 mL IVPB        1 g 200 mL/hr over 30 Minutes Intravenous  Once 09/10/22 1331 09/10/22 1605   09/10/22 1345  azithromycin (ZITHROMAX) 500 mg in sodium chloride 0.9 % 250 mL IVPB        500 mg 250 mL/hr over 60 Minutes Intravenous  Once 09/10/22 1331 09/10/22 1605       Objective:  Vital Signs  Vitals:   10/09/22 2000 10/09/22 2302 10/10/22 0312 10/10/22 0500  BP: (!) 149/84 130/69 115/74   Pulse: 68 76 70   Resp: 18 17 16    Temp: 97.9 F (36.6 C) 98 F (36.7 C) 97.8 F (36.6 C)   TempSrc: Axillary     SpO2: 100% 100% 100%   Weight:    67.3 kg  Height:        Intake/Output Summary (Last 24 hours) at 10/10/2022 0746 Last data filed at 10/09/2022 1812 Gross per 24 hour  Intake 150 ml  Output 550 ml  Net -400 ml    Filed Weights   10/06/22 0428 10/09/22 0500 10/10/22 0500  Weight: 58.8 kg 66.1 kg 67.3 kg    General appearance: Awake alert.  In no distress NG tube is noted. Resp: Clear  to auscultation bilaterally.  Normal effort Cardio: S1-S2 is normal regular.  No S3-S4.  No rubs murmurs or bruit GI: Abdomen is distended with ascites.  Nontender.  Bowel sounds present.  Extremities: No edema.   Left Hemiparesis.   Lab Results:  Data Reviewed: I have personally reviewed following labs and reports of the imaging studies  CBC: Recent Labs  Lab 10/04/22 0352 10/06/22 0306 10/08/22 0328  WBC 9.5 10.4 9.8  NEUTROABS 6.7 7.2  --   HGB 9.0* 9.8* 10.2*  HCT 27.4* 31.0* 33.3*  MCV 107.0* 109.5* 112.9*  PLT 119* 126* 109*     Basic Metabolic Panel: Recent Labs  Lab 10/06/22 0306 10/07/22 0243 10/08/22 0328 10/09/22 0611 10/10/22 0552  NA 158* 147* 138 137 137  K 3.3* 4.1 3.9 3.7 3.8  CL  125* 119* 115* 113* 110  CO2 22 22 15* 18* 18*  GLUCOSE 96 106* 89 94 114*  BUN 60* 53* 51* 58* 64*  CREATININE 1.88* 1.72* 1.71* 1.81* 1.84*  CALCIUM 7.9* 7.6* 7.4* 7.6* 7.5*     GFR: Estimated Creatinine Clearance: 38.4 mL/min (A) (by C-G formula based on SCr of 1.84 mg/dL (H)).  Liver Function Tests: Recent Labs  Lab 10/04/22 0352  AST 79*  ALT 46*  ALKPHOS 93  BILITOT 1.0  PROT 6.3*  ALBUMIN 1.8*     Recent Labs  Lab 10/04/22 0352 10/08/22 0329  AMMONIA 40* 46*     Coagulation Profile: Recent Labs  Lab 10/04/22 0352  INR 1.4*      CBG: Recent Labs  Lab 10/09/22 0749 10/09/22 1146 10/09/22 1627 10/09/22 2303 10/10/22 0311  GLUCAP 103* 86 97 88 89      Radiology Studies: US Abdomen Complete  Result Date: 10/08/2022 CLINICAL DATA:  [id number] Ascites [id number] AKI (acute kidney injury) [facility]) [id number] Cirrhosis (HCC) [id number] EXAM: ABDOMEN ULTRASOUND COMPLETE COMPARISON:  July 01, 2022 October 12, 2018 FINDINGS: Gallbladder: No gallstones visualized. Gallbladder is decompressed wall is diffusely thickened, consistent with fluid overload state. No sonographic Murphy sign noted by sonographer. Common bile duct: Diameter: Visualized portion measures 3 mm, within normal limits Liver: No focal lesion identified. Coarsened increased hepatic parenchymal echogenicity. Nodular liver contours. Portal vein is grossly patent on color Doppler imaging with normal direction of blood flow towards the liver. IVC: Not visualized secondary to shadowing bowel gas and ascites Pancreas: Not visualized secondary to shadowing bowel gas. Spleen: Size and appearance within normal limits. Right Kidney: Length: 8.0 cm. Echogenicity appears mildly increased. No mass or hydronephrosis visualized. Left Kidney: Length: 8.2 cm. Echogenicity appears mildly increased. No mass or hydronephrosis visualized. Abdominal aorta: Not visualized secondary to shadowing bowel gas. Other findings:  Large volume ascites. IMPRESSION: 1. Cirrhotic liver morphology without focal lesion identified sonographically. 2. Large volume ascites. 3. Gallbladder wall thickening is consistent with fluid overload state. 4. Mildly increased echogenicity of the bilateral kidneys could reflect underlying medical renal disease versus artifact secondary to extensive ascites. No hydronephrosis. Electronically Signed   By: Meda Klinefelter M.D.   On: 10/08/2022 15:17       LOS: 29 days   Jacob Fallen DO  [facility] Pager on [email or web address]  10/10/2022, 7:46 AM

## 2022-10-11 ENCOUNTER — Inpatient Hospital Stay (HOSPITAL_COMMUNITY): Payer: Medicaid Other

## 2022-10-11 DIAGNOSIS — I639 Cerebral infarction, unspecified: Secondary | ICD-10-CM | POA: Diagnosis not present

## 2022-10-11 HISTORY — PX: IR PARACENTESIS: IMG2679

## 2022-10-11 LAB — CBC
HCT: 29.3 % — ABNORMAL LOW (ref 39.0–52.0)
Hemoglobin: 9.6 g/dL — ABNORMAL LOW (ref 13.0–17.0)
MCH: 34.9 pg — ABNORMAL HIGH (ref 26.0–34.0)
MCHC: 32.8 g/dL (ref 30.0–36.0)
MCV: 106.5 fL — ABNORMAL HIGH (ref 80.0–100.0)
Platelets: 91 10*3/uL — ABNORMAL LOW (ref 150–400)
RBC: 2.75 MIL/uL — ABNORMAL LOW (ref 4.22–5.81)
RDW: 18.8 % — ABNORMAL HIGH (ref 11.5–15.5)
WBC: 8.7 10*3/uL (ref 4.0–10.5)
nRBC: 0 % (ref 0.0–0.2)

## 2022-10-11 LAB — BASIC METABOLIC PANEL
Anion gap: 9 (ref 5–15)
BUN: 66 mg/dL — ABNORMAL HIGH (ref 8–23)
CO2: 17 mmol/L — ABNORMAL LOW (ref 22–32)
Calcium: 7.6 mg/dL — ABNORMAL LOW (ref 8.9–10.3)
Chloride: 111 mmol/L (ref 98–111)
Creatinine, Ser: 1.76 mg/dL — ABNORMAL HIGH (ref 0.61–1.24)
GFR, Estimated: 43 mL/min — ABNORMAL LOW (ref >60)
Glucose, Bld: 104 mg/dL — ABNORMAL HIGH (ref 70–99)
Potassium: 4.1 mmol/L (ref 3.5–5.1)
Sodium: 137 mmol/L (ref 135–145)

## 2022-10-11 LAB — GLUCOSE, CAPILLARY
Glucose-Capillary: 92 mg/dL (ref 70–99)
Glucose-Capillary: 78 mg/dL (ref 70–99)
Glucose-Capillary: 105 mg/dL — ABNORMAL HIGH (ref 70–99)
Glucose-Capillary: 98 mg/dL (ref 70–99)
Glucose-Capillary: 87 mg/dL (ref 70–99)

## 2022-10-11 MED ORDER — LIDOCAINE HCL 1 % IJ SOLN
INTRAMUSCULAR | Status: AC
  Filled 2022-10-11: qty 20

## 2022-10-11 NOTE — Procedures (Signed)
PROCEDURE SUMMARY:  Successful US guided paracentesis from right abdomen.  Yielded 3 L of clear yellow fluid.  No immediate complications.  Pt tolerated well.   EBL < 2 mL  Theresa Duty, NP 10/11/2022 3:57 PM

## 2022-10-11 NOTE — Progress Notes (Signed)
[facility] PROGRESS NOTE   Jacob Bass YNW:[id number] DOB: 26-Nov-1960 DOA: 09/10/2022  PCP: Donia Ast., MD  Brief History/Interval Summary:  62 y.o. male with PMH significant for history of stroke with residual left-sided deficits, HTN, liver cirrhosis, chronic anemia, COPD, recent diagnosis of COVID who lives at [facility]. On 1/8, patient was sent to the ED for altered mental status and weakness.  Last known normal the previous night. MRI brain positive for small acute infarct. Because of stroke, patient has dysphagia, expressive aphasia and worsening of left-sided deficits.  His hospital course has been complicated by recurrent ascites and dysphagia.  He has required multiple paracentesis.  Started on nasogastric tube feedings. Not candidate for PEG tube placement due to recurrent ascites.   Feedings had to be held for 2 days due to recurrent aspirations and tube clogging.  Now on nocturnal feeds.   Palliative care consulted.  Legal guardianship is in process.  Poor prognosis. Patient remains full code  Consultants: Palliative care.  Subjective/Interval History: Patient is awake, alert, follows some simple commands and replies with one-word answers but difficult to understand, unable to assess orientation.  Review of systems limited.  Assessment/Plan:  Liver cirrhosis with anasarca/ascites/history of hepatitis C and alcohol abuse Per history, patient had generalized anasarca and worsening pedal edema since October. Presented with significant ascites and has required multiple paracentesis so far during this admission. Last paracentesis borrows on 1/31 with drainage of 3.1 L of hazy yellow liquid. LFTs have been stable. Furosemide ongoing, will need repeat paracentesis in the next 24h given increasing abdominal fluid Ultrasound showed cirrhotic appearance of liver. Patient with previous history of alcohol abuse and history of hepatitis C.  Please note that patient has  another medical record MRN [id number].   Acute GI bleeding in the setting of liver cirrhosis, resolved Acute blood loss anemia, stable On 1/24, patient had an episode of hematochezia, hematemesis.  Tube feeding was stopped.  GI consultation was obtained.  Patient was started on Protonix. GI bleeding seems to have stabilized.  Hemoglobin roughly stable.  No EGD at this time per GI.  Tube feedings were reinitiated. Hemoglobin is stable.  No evidence for overt bleeding.   Acute CVA/Prior CVA with left-sided deficits Primarily brought to the ED after patient was noted to be less interactive compared to her baseline. MRI showed left acute CVA. Stroke workup completed.  Echo showed EF of 55 to 60% with no cardiac source of embolism A1c 4.5, LDL 74 Because of active bleeding, patient is currently not on any antithrombotic or anticoagulant. Statin not started because of underlying liver cirrhosis Current deficit include expressive aphasia and dysphagia.  Still has left-sided weakness.   Oropharyngeal dysphagia Secondary to stroke  Currently on nocturnal nasogastric tube feedings. Not a candidate for PEG tube due to recurrent ascites Dysphagia 1 (puree);Honey-thick liquid ongoing  Hypoglycemia, chronic Continues to have hypoglycemia in the morning despite nocturnal feeds, patient is not on any insulin or diabetic medications that could cause his recurrent hypoglycemia. Will discuss with dietary, may need to increase tube feeds overnight to offset his hypoglycemia.  Hypernatremia, resolved Sodium level peaked at 158 Continue free water flushes per dietary, currently well-controlled  Aspiration pneumonitis versus pneumonia On 1/20, patient vomited. Tube feeding was stopped.   Completed ceftriaxone - Respiratory status is stable.   Acute hepatic encephalopathy, improved Ammonia level was initially 99 improved and then worsened again to become 133.  Continue with lactulose.   Ammonia level 46  on 2/5.  Mentation is stable and likely at baseline.     Acute kidney injury/hypokalemia, resolving Baseline creatinine around 1.   Presented with creatinine of 2.08, peaked at 2.12.  Creatinine likely at new baseline around 1.9 Continue torsemide   COPD Without acute exacerbation  Continue bronchodilators   BPH Flomax   Impaired mobility PT evaluation obtained.  SNF recommended   Goals of care Palliative care is following.  DSS is involved but no legal guardian yet. [facility] for guardianship [name], [phone]). On 1/29, palliative care reached out to DSS again.  They were told that petition was filed on 1/29 for guardianship.   Ethics was involved.  Remains full code as of now.  Moderate calorie malnutrition Nutrition Problem: Moderate Malnutrition Etiology: chronic illness  DVT Prophylaxis: SCDs Code Status: Full code Family Communication: None available, no legal guardian Disposition Plan: SNF when medically stable.  Status is: Inpatient Remains inpatient appropriate because: Oropharyngeal dysphagia requiring NG tube feedings  Medications: Scheduled:  feeding supplement (JEVITY 1.5 CAL/FIBER)  1,000 mL Per Tube Q24H   feeding supplement (PROSource TF20)  60 mL Per Tube Daily   fiber supplement (BANATROL TF)  60 mL Per Tube BID   free water  300 mL Per Tube Q4H   furosemide  40 mg Intravenous Daily   lactulose  10 g Per Tube BID   latanoprost  1 drop Both Eyes QHS   lidocaine (PF)  15 mL Intradermal Once   lidocaine (PF)  5 mL Intradermal Once   metoprolol tartrate  25 mg Per Tube BID   pantoprazole (PROTONIX) IV  40 mg Intravenous Q24H   tamsulosin  0.4 mg Oral Daily   Continuous:   PRN:[DISCONTINUED] acetaminophen **OR** acetaminophen (TYLENOL) oral liquid 160 mg/5 mL **OR** acetaminophen, dextrose, Gerhardt's butt cream, ipratropium-albuterol, ondansetron (ZOFRAN) IV, senna-docusate  Antibiotics: Anti-infectives (From admission,  onward)    Start     Dose/Rate Route Frequency Ordered Stop   10/02/22 1000  cefTRIAXone (ROCEPHIN) 1 g in sodium chloride 0.9 % 100 mL IVPB        1 g 200 mL/hr over 30 Minutes Intravenous Every 24 hours 10/02/22 0855 10/08/22 1311   09/10/22 1345  cefTRIAXone (ROCEPHIN) 1 g in sodium chloride 0.9 % 100 mL IVPB        1 g 200 mL/hr over 30 Minutes Intravenous  Once 09/10/22 1331 09/10/22 1605   09/10/22 1345  azithromycin (ZITHROMAX) 500 mg in sodium chloride 0.9 % 250 mL IVPB        500 mg 250 mL/hr over 60 Minutes Intravenous  Once 09/10/22 1331 09/10/22 1605       Objective:  Vital Signs  Vitals:   10/10/22 1937 10/10/22 2259 10/11/22 0315 10/11/22 0500  BP: 110/78 126/80 (!) 146/93   Pulse: 74  68   Resp: 16 18 16    Temp: 97.8 F (36.6 C) 97.8 F (36.6 C) (!) 97.4 F (36.3 C)   TempSrc: Oral Axillary Oral   SpO2: 100% 100% 96%   Weight:    66.3 kg  Height:        Intake/Output Summary (Last 24 hours) at 10/11/2022 0748 Last data filed at 10/11/2022 0500 Gross per 24 hour  Intake 594 ml  Output 900 ml  Net -306 ml    Filed Weights   10/09/22 0500 10/10/22 0500 10/11/22 0500  Weight: 66.1 kg 67.3 kg 66.3 kg    General appearance: Awake alert.  In no distress NG  tube is noted. Resp: Clear to auscultation bilaterally.  Normal effort Cardio: S1-S2 is normal regular.  No S3-S4.  No rubs murmurs or bruit GI: Abdomen is distended with ascites.  Nontender.  Bowel sounds present.  Extremities: No edema.   Left Hemiparesis.   Lab Results:  Data Reviewed: I have personally reviewed following labs and reports of the imaging studies  CBC: Recent Labs  Lab 10/06/22 0306 10/08/22 0328 10/11/22 0440  WBC 10.4 9.8 8.7  NEUTROABS 7.2  --   --   HGB 9.8* 10.2* 9.6*  HCT 31.0* 33.3* 29.3*  MCV 109.5* 112.9* 106.5*  PLT 126* 109* 91*     Basic Metabolic Panel: Recent Labs  Lab 10/07/22 0243 10/08/22 0328 10/09/22 0611 10/10/22 0552 10/11/22 0440  NA 147*  138 137 137 137  K 4.1 3.9 3.7 3.8 4.1  CL 119* 115* 113* 110 111  CO2 22 15* 18* 18* 17*  GLUCOSE 106* 89 94 114* 104*  BUN 53* 51* 58* 64* 66*  CREATININE 1.72* 1.71* 1.81* 1.84* 1.76*  CALCIUM 7.6* 7.4* 7.6* 7.5* 7.6*     GFR: Estimated Creatinine Clearance: 40.2 mL/min (A) (by C-G formula based on SCr of 1.76 mg/dL (H)).  Liver Function Tests: No results for input(s): "AST", "ALT", "ALKPHOS", "BILITOT", "PROT", "ALBUMIN" in the last 168 hours.   Recent Labs  Lab 10/08/22 0329  AMMONIA 46*     Coagulation Profile: No results for input(s): "INR", "PROTIME" in the last 168 hours.    CBG: Recent Labs  Lab 10/10/22 1201 10/10/22 1640 10/10/22 1940 10/10/22 2318 10/11/22 0316  GLUCAP 94 75 95 78 92      Radiology Studies: No results found.     LOS: 30 days   [name] DO  [facility] Pager on [email or web address]  10/11/2022, 7:48 AM

## 2022-10-12 DIAGNOSIS — I639 Cerebral infarction, unspecified: Secondary | ICD-10-CM | POA: Diagnosis not present

## 2022-10-12 LAB — GLUCOSE, CAPILLARY
Glucose-Capillary: 104 mg/dL — ABNORMAL HIGH (ref 70–99)
Glucose-Capillary: 104 mg/dL — ABNORMAL HIGH (ref 70–99)
Glucose-Capillary: 105 mg/dL — ABNORMAL HIGH (ref 70–99)
Glucose-Capillary: 86 mg/dL (ref 70–99)
Glucose-Capillary: 91 mg/dL (ref 70–99)
Glucose-Capillary: 92 mg/dL (ref 70–99)
Glucose-Capillary: 97 mg/dL (ref 70–99)

## 2022-10-12 NOTE — Progress Notes (Signed)
Occupational Therapy Treatment Patient Details Name: Jacob Bass MRN: [id number] DOB: 1960/11/07 Today's Date: 10/12/2022   History of present illness 61 y.o. male presents to [facility] on 09/10/2022 with AMS and L weakness from [facility]. 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 Complicated by fluid overload. Paracentesis performed 1/14, 1/16, 1/21, 1/27. PMH includes CVA, COPD, cirrhosis, gastric ulcer.   OT comments  Patient making gain with OT/PT treatment with patient increasing sitting balance and performing toilet transfer to [facility].  Patient continues to require assist of 2 for transfers and toilet hygiene. Patient discharge recommendations for SNF continue to be appropriate for continue OT therapy to address functional transfers, eating, and grooming. Acute OT to continue to follow.    Recommendations for follow up therapy are one component of a multi-disciplinary discharge planning process, led by the attending physician.  Recommendations may be updated based on patient status, additional functional criteria and insurance authorization.    Follow Up Recommendations  Skilled nursing-short term rehab (<3 hours/day)     Assistance Recommended at Discharge Frequent or constant Supervision/Assistance  Patient can return home with the following  Two people to help with walking and/or transfers;Two people to help with bathing/dressing/bathroom   Equipment Recommendations  None recommended by OT    Recommendations for Other Services      Precautions / Restrictions Precautions Precautions: Fall;Other (comment) Precaution Comments: cortrak Restrictions Weight Bearing Restrictions: No       Mobility Bed Mobility Overal bed mobility: Needs Assistance Bed Mobility: Supine to Sit, Sit to Supine     Supine to sit: Mod assist, +2 for physical assistance Sit to supine: Max assist, +2 for physical assistance   General bed mobility comments: hand over hand guidance for reaching  for bed rail, assist with BLE's and trunk    Transfers Overall transfer level: Needs assistance Equipment used: 2 person hand held assist Transfers: Sit to/from Stand, Bed to chair/wheelchair/BSC Sit to Stand: Total assist, +2 physical assistance, Max assist   Squat pivot transfers: Max assist, +2 physical assistance, +2 safety/equipment       General transfer comment: Total a +2 to come to stand x2 for peri care, pt with felxed trunk and LEs througout with inability to correct with cues, Pt able to squat pivot EOB<>BSC with max a +2 with pt demonstrating fair hand placement reaching fro arm rests and bedrail on return     Balance Overall balance assessment: Needs assistance Sitting-balance support: Feet supported, No upper extremity supported Sitting balance-Leahy Scale: Fair Sitting balance - Comments: pt able to maintain ~10 mins EOB without assist and maintain sitting upright on BSC for ~5 mins   Standing balance support: Bilateral upper extremity supported Standing balance-Leahy Scale: Zero Standing balance comment: max a +2 to maintain upright standign                           ADL either performed or assessed with clinical judgement   ADL Overall ADL's : Needs assistance/impaired                         Toilet Transfer: +2 for physical assistance;Maximal assistance;BSC/3in1 Statistician Details (indicate cue type and reason): 2 person HHA Toileting- Clothing Manipulation and Hygiene: Total assistance;+2 for physical assistance;Sit to/from stand Toileting - Clothing Manipulation Details (indicate cue type and reason): one person assisted with standing and another with hygiene  Extremity/Trunk Assessment              Vision       Perception     Praxis      Cognition Arousal/Alertness: Awake/alert Behavior During Therapy: Flat affect Overall Cognitive Status: No family/caregiver present to determine baseline cognitive  functioning                                 General Comments: difficult to understand, grunting and answering yes/no        Exercises      Shoulder Instructions       General Comments      Pertinent Vitals/ Pain       Pain Assessment Pain Assessment: Faces Faces Pain Scale: No hurt Pain Intervention(s): Monitored during session  Home Living                                          Prior Functioning/Environment              Frequency  Min 2X/week        Progress Toward Goals  OT Goals(current goals can now be found in the care plan section)  Progress towards OT goals: Progressing toward goals  Acute Rehab OT Goals OT Goal Formulation: Patient unable to participate in goal setting Time For Goal Achievement: 10/23/22 Potential to Achieve Goals: Fair ADL Goals Pt Will Perform Eating: with min assist;bed level;sitting Pt Will Perform Grooming: with min assist;sitting Pt Will Perform Upper Body Bathing: with mod assist;sitting Pt/caregiver will Perform Home Exercise Program: Increased ROM;Left upper extremity;Increased strength;With Supervision Additional ADL Goal #1: Pt to demo bed mobility with Min A in prep for functional tasks EOB  Plan Discharge plan remains appropriate;Frequency remains appropriate    Co-evaluation    PT/OT/SLP Co-Evaluation/Treatment: Yes Reason for Co-Treatment: Complexity of the patient's impairments (multi-system involvement);For patient/therapist safety;To address functional/ADL transfers PT goals addressed during session: Mobility/safety with mobility OT goals addressed during session: ADL's and self-care;Strengthening/ROM      AM-PAC OT "6 Clicks" Daily Activity     Outcome Measure   Help from another person eating meals?: A Lot Help from another person taking care of personal grooming?: A Lot Help from another person toileting, which includes using toliet, bedpan, or urinal?: Total Help  from another person bathing (including washing, rinsing, drying)?: Total Help from another person to put on and taking off regular upper body clothing?: Total Help from another person to put on and taking off regular lower body clothing?: Total 6 Click Score: 8    End of Session Equipment Utilized During Treatment: Gait belt  OT Visit Diagnosis: Unsteadiness on feet (R26.81);Other abnormalities of gait and mobility (R26.89);Muscle weakness (generalized) (M62.81)   Activity Tolerance Patient tolerated treatment well   Patient Left in bed;with call bell/phone within reach;with bed alarm set   Nurse Communication Mobility status        Time: 0936-1000 OT Time Calculation (min): 24 min  Charges: OT General Charges $OT Visit: 1 Visit OT Treatments $Self Care/Home Management : 8-22 mins  Alfonse Flavors, OTA Acute Rehabilitation Services  Office [phone]   Dewain Penning 10/12/2022, 2:25 PM

## 2022-10-12 NOTE — TOC Progression Note (Signed)
Transition of Care [facility]) - Progression Note    Patient Details  Name: Jacob Bass MRN: [id number] Date of Birth: 05/13/1961  Transition of Care [facility]) CM/SW [place], [place] Phone Number: 10/12/2022, 11:01 AM  Clinical Narrative:   CSW notified that patient may be approaching medical stability, could return to SNF while awaiting guardianship. CSW notified [facility], they are able to admit patient back if stable. CSW to follow.    Expected Discharge Plan: [place] Barriers to Discharge: Continued Medical Work up, Family Issues  Expected Discharge Plan and Services     Post Acute Care Choice: [place] arrangements for the past 2 months: [place]                                       Social Determinants of Health (SDOH) Interventions    Readmission Risk Interventions     No data to display

## 2022-10-12 NOTE — Progress Notes (Signed)
[facility] PROGRESS NOTE   Zidan Alfonse Ras [id number] DOB: November 26, 1960 DOA: 09/10/2022  PCP: Donia Ast., MD  Brief History/Interval Summary:  62 y.o. male with PMH significant for history of stroke with residual left-sided deficits, HTN, liver cirrhosis, chronic anemia, COPD, recent diagnosis of COVID who lives at [facility] On 1/8, patient was sent to the ED for altered mental status and weakness.  Last known normal the previous night. MRI brain positive for small acute infarct. Because of stroke, patient has dysphagia, expressive aphasia and worsening of left-sided deficits.   His hospital course has been complicated by recurrent ascites and dysphagia.  He has required multiple paracentesis.  Started on nasogastric tube feedings. Not candidate for PEG tube placement due to recurrent ascites. Feedings had to be held for 2 days due to recurrent aspirations and tube clogging.  Now on nocturnal feeds. Palliative care consulted.  Legal guardianship is in process.  Poor prognosis. Patient remains full code.  Patient disposition continues to be complicated due to his need for DSS/Guardianship - meeting 10/11/22 preliminary hearing completed. Next meeting 10/24/22 to hopefully decide guardianship and further medical care planning. If patient's p.o. intake can improve an NG tube can be removed it would be reasonable to discharge him back to facility, he can await results of the future meeting on guardianship at facility, he does not need to be hospitalized for this decision.  Consultants: Palliative care.  Subjective/Interval History: Patient is awake, alert, follows some simple commands and replies with one-word answers but difficult to understand, unable to assess orientation.  Review of systems limited.  Assessment/Plan:  Liver cirrhosis with anasarca/ascites/history of hepatitis C and alcohol abuse Per history, patient had generalized anasarca and worsening pedal edema since  October. Presented with significant ascites and has required multiple paracentesis so far during this admission. Last paracentesis borrows on 1/31 with drainage of 3.1 L of hazy yellow liquid. LFTs have been stable. Furosemide ongoing, 3L paracentesis successful 2/9 - no comlpications Ultrasound showed cirrhotic appearance of liver. Patient with previous history of alcohol abuse and history of hepatitis C.  Please note that patient has another medical record MRN [id number].   Acute GI bleeding in the setting of liver cirrhosis, resolved Acute blood loss anemia, stable On 1/24, patient had an episode of hematochezia, hematemesis.  Tube feeding was stopped.  GI consultation was obtained.  Patient was started on Protonix. GI bleeding seems to have stabilized.  Hemoglobin roughly stable.  No EGD at this time per GI.  Tube feedings were reinitiated. Hemoglobin is stable.  No evidence for overt bleeding.   Acute CVA/Prior CVA with left-sided deficits Primarily brought to the ED after patient was noted to be less interactive compared to her baseline. MRI showed left acute CVA. Stroke workup completed.  Echo showed EF of 55 to 60% with no cardiac source of embolism A1c 4.5, LDL 74 Because of active bleeding, patient is currently not on any antithrombotic or anticoagulant. Statin not started because of underlying liver cirrhosis Current deficit include expressive aphasia and dysphagia.  Still has left-sided weakness.   Oropharyngeal dysphagia Secondary to stroke  Currently on nocturnal nasogastric tube feedings. Not a candidate for PEG tube due to recurrent ascites Dysphagia 1 (puree);Honey-thick liquid ongoing  Hypoglycemia, chronic Continues to have hypoglycemia in the morning despite nocturnal feeds, patient is not on any insulin or diabetic medications that could cause his recurrent hypoglycemia. Will discuss with dietary, may need to increase tube feeds overnight to offset his  hypoglycemia.  Hypernatremia, resolved Sodium level peaked at 158 Continue free water flushes per dietary, currently well-controlled  Aspiration pneumonitis versus pneumonia On 1/20, patient vomited. Tube feeding was stopped.   Completed ceftriaxone - Respiratory status is stable.   Acute hepatic encephalopathy, improved Ammonia level was initially 99 improved and then worsened again to become 133.  Continue with lactulose.   Ammonia level 46 on 2/5.  Mentation is stable and likely at baseline.     Acute kidney injury/hypokalemia, resolving Baseline creatinine around 1.   Presented with creatinine of 2.08, peaked at 2.12.  Creatinine likely at new baseline around 1.9 Continue torsemide   COPD Without acute exacerbation  Continue bronchodilators   BPH Flomax   Impaired mobility PT evaluation obtained.  SNF recommended   Goals of care Palliative care is following.  DSS is involved but no legal guardian yet. [facility] for guardianship [name], [phone]). On 1/29, palliative care reached out to DSS again.  They were told that petition was filed on 1/29 for guardianship.   Ethics was involved.  Remains full code as of now.  Moderate calorie malnutrition Nutrition Problem: Moderate Malnutrition Etiology: chronic illness  DVT Prophylaxis: SCDs Code Status: Full code Family Communication: None available, no legal guardian Disposition Plan: SNF when medically stable.  Status is: Inpatient Remains inpatient appropriate because: Oropharyngeal dysphagia requiring NG tube feedings  Medications: Scheduled:  feeding supplement (JEVITY 1.5 CAL/FIBER)  1,000 mL Per Tube Q24H   feeding supplement (PROSource TF20)  60 mL Per Tube Daily   fiber supplement (BANATROL TF)  60 mL Per Tube BID   free water  300 mL Per Tube Q4H   furosemide  40 mg Intravenous Daily   lactulose  10 g Per Tube BID   latanoprost  1 drop Both Eyes QHS   lidocaine (PF)  15 mL  Intradermal Once   lidocaine (PF)  5 mL Intradermal Once   metoprolol tartrate  25 mg Per Tube BID   pantoprazole (PROTONIX) IV  40 mg Intravenous Q24H   tamsulosin  0.4 mg Oral Daily   Continuous:   PRN:[DISCONTINUED] acetaminophen **OR** acetaminophen (TYLENOL) oral liquid 160 mg/5 mL **OR** acetaminophen, dextrose, Gerhardt's butt cream, ipratropium-albuterol, ondansetron (ZOFRAN) IV, senna-docusate  Antibiotics: Anti-infectives (From admission, onward)    Start     Dose/Rate Route Frequency Ordered Stop   10/02/22 1000  cefTRIAXone (ROCEPHIN) 1 g in sodium chloride 0.9 % 100 mL IVPB        1 g 200 mL/hr over 30 Minutes Intravenous Every 24 hours 10/02/22 0855 10/08/22 1311   09/10/22 1345  cefTRIAXone (ROCEPHIN) 1 g in sodium chloride 0.9 % 100 mL IVPB        1 g 200 mL/hr over 30 Minutes Intravenous  Once 09/10/22 1331 09/10/22 1605   09/10/22 1345  azithromycin (ZITHROMAX) 500 mg in sodium chloride 0.9 % 250 mL IVPB        500 mg 250 mL/hr over 60 Minutes Intravenous  Once 09/10/22 1331 09/10/22 1605       Objective:  Vital Signs  Vitals:   10/12/22 0441 10/12/22 0452 10/12/22 0453 10/12/22 0713  BP:  122/70  129/74  Pulse:  100 71 73  Resp:  16  14  Temp:  97.9 F (36.6 C)  97.7 F (36.5 C)  TempSrc:  Oral  Oral  SpO2:   100%   Weight: 64.1 kg     Height:        Intake/Output  Summary (Last 24 hours) at 10/12/2022 0748 Last data filed at 10/12/2022 0600 Gross per 24 hour  Intake 29518 ml  Output 1200 ml  Net 16430 ml    Filed Weights   10/10/22 0500 10/11/22 0500 10/12/22 0441  Weight: 67.3 kg 66.3 kg 64.1 kg    General appearance: Awake alert.  In no distress NG tube is noted. Resp: Clear to auscultation bilaterally.  Normal effort Cardio: S1-S2 is normal regular.  No S3-S4.  No rubs murmurs or bruit GI: Abdomen is distended with ascites.  Nontender.  Bowel sounds present.  Extremities: No edema.   Left Hemiparesis.   Lab Results:  Data  Reviewed: I have personally reviewed following labs and reports of the imaging studies  CBC: Recent Labs  Lab 10/06/22 0306 10/08/22 0328 10/11/22 0440  WBC 10.4 9.8 8.7  NEUTROABS 7.2  --   --   HGB 9.8* 10.2* 9.6*  HCT 31.0* 33.3* 29.3*  MCV 109.5* 112.9* 106.5*  PLT 126* 109* 91*     Basic Metabolic Panel: Recent Labs  Lab 10/07/22 0243 10/08/22 0328 10/09/22 0611 10/10/22 0552 10/11/22 0440  NA 147* 138 137 137 137  K 4.1 3.9 3.7 3.8 4.1  CL 119* 115* 113* 110 111  CO2 22 15* 18* 18* 17*  GLUCOSE 106* 89 94 114* 104*  BUN 53* 51* 58* 64* 66*  CREATININE 1.72* 1.71* 1.81* 1.84* 1.76*  CALCIUM 7.6* 7.4* 7.6* 7.5* 7.6*     GFR: Estimated Creatinine Clearance: 38.9 mL/min (A) (by C-G formula based on SCr of 1.76 mg/dL (H)).  Liver Function Tests: No results for input(s): "AST", "ALT", "ALKPHOS", "BILITOT", "PROT", "ALBUMIN" in the last 168 hours.   Recent Labs  Lab 10/08/22 0329  AMMONIA 46*     Coagulation Profile: No results for input(s): "INR", "PROTIME" in the last 168 hours.    CBG: Recent Labs  Lab 10/11/22 1149 10/11/22 1617 10/11/22 2031 10/12/22 0038 10/12/22 0555  GLUCAP 105* 98 87 104* 104*      Radiology Studies: IR Paracentesis  Result Date: 10/11/2022 INDICATION: Patient with a history of cirrhosis with recurrent ascites. Interventional radiology asked to perform a therapeutic paracentesis. EXAM: ULTRASOUND GUIDED PARACENTESIS MEDICATIONS: 1% lidocaine 10 mL COMPLICATIONS: None immediate. PROCEDURE: Informed written consent was obtained from the patient after a discussion of the risks, benefits and alternatives to treatment. A timeout was performed prior to the initiation of the procedure. Initial ultrasound scanning demonstrates a large amount of ascites within the right lower abdominal quadrant. The right lower abdomen was prepped and draped in the usual sterile fashion. 1% lidocaine was used for local anesthesia. Following this, a  19 gauge, 7-cm, Yueh catheter was introduced. An ultrasound image was saved for documentation purposes. The paracentesis was performed. The catheter was removed and a dressing was applied. The patient tolerated the procedure well without immediate post procedural complication. FINDINGS: A total of approximately 3 L of clear yellow fluid was removed. IMPRESSION: Successful ultrasound-guided paracentesis yielding 3 liters of peritoneal fluid. Read by: Alwyn Ren, NP Electronically Signed   By: Richarda Overlie M.D.   On: 10/11/2022 18:54       LOS: 31 days   [name] DO  [facility] Pager on [email or web address]  10/12/2022, 7:48 AM

## 2022-10-12 NOTE — Plan of Care (Signed)

## 2022-10-12 NOTE — TOC Progression Note (Signed)
Transition of Care [facility]) - Progression Note    Patient Details  Name: Jacob Bass MRN: [id number] Date of Birth: [birth year]  Transition of Care [facility]) CM/SW [place], [place] Phone Number: 10/12/2022, 11:00 AM  Clinical Narrative:   CSW notified that patient received paperwork documenting that the guardianship petition has been filed, next hearing scheduled for 10/24/22. Patient still has no decision maker at this time. CSW to continue to follow.    Expected Discharge Plan: [place] Barriers to Discharge: Continued Medical Work up, Family Issues  Expected Discharge Plan and Services     Post Acute Care Choice: [place] arrangements for the past 2 months: [place]                                       Social Determinants of Health (SDOH) Interventions    Readmission Risk Interventions     No data to display

## 2022-10-12 NOTE — Progress Notes (Signed)
Physical Therapy Treatment Patient Details Name: Jacob Bass MRN: [id number] DOB: 03-31-1961 Today's Date: 10/12/2022   History of Present Illness 62 y.o. male presents to [facility] hospital on 09/10/2022 with AMS and L weakness from [facility]. 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 Complicated by fluid overload. Paracentesis performed 1/14, 1/16, 1/21, 1/27. PMH includes CVA, COPD, cirrhosis, gastric ulcer.    PT Comments    Pt agreeable to PT/OT session with focus on sitting balance and functional transfers for increased LE strength and improved balance/postural reactions. Pt able to come to sit EOB with mod a +2 and maintain sitting balance without assist this date with intermittent single UE and without UE support. Pt able to come to stand with max a +2 x2 trials and squat pivot transfer EOB<>BSC with max a +2 as pt with poor LE activation and flexed trunk and Les throughout, however pt reaching LUE for armrest and bedrail to self guide hips without cues. Pt awake and alert throughout session, however with continued minimal verbal interaction. Current plan remains appropriate to address deficits and maximize functional independence and decrease caregiver burden. Pt continues to benefit from skilled PT services to progress toward functional mobility goals.    Recommendations for follow up therapy are one component of a multi-disciplinary discharge planning process, led by the attending physician.  Recommendations may be updated based on patient status, additional functional criteria and insurance authorization.  Follow Up Recommendations  Skilled nursing-short term rehab (<3 hours/day) Can patient physically be transported by private vehicle: No   Assistance Recommended at Discharge Frequent or constant Supervision/Assistance  Patient can return home with the following Two people to help with walking and/or transfers;Two people to help with bathing/dressing/bathroom;Assistance with  cooking/housework;Assistance with feeding;Direct supervision/assist for medications management;Direct supervision/assist for financial management;Assist for transportation;Help with stairs or ramp for entrance   Equipment Recommendations  Wheelchair (measurements PT);Wheelchair cushion (measurements PT);Hospital bed;Other (comment) (hoyer lift)    Recommendations for Other Services       Precautions / Restrictions Precautions Precautions: Fall;Other (comment) Precaution Comments: cortrak Restrictions Weight Bearing Restrictions: No     Mobility  Bed Mobility Overal bed mobility: Needs Assistance Bed Mobility: Supine to Sit, Sit to Supine     Supine to sit: Mod assist, +2 for physical assistance Sit to supine: Max assist, +2 for physical assistance   General bed mobility comments: hand over hand guidance for reaching for bed rail, assist with BLE's and trunk    Transfers Overall transfer level: Needs assistance Equipment used: 2 person hand held assist Transfers: Sit to/from Stand, Bed to chair/wheelchair/BSC Sit to Stand: Total assist, +2 physical assistance, Max assist     Squat pivot transfers: Max assist, +2 physical assistance, +2 safety/equipment     General transfer comment: Total a +2 to come to stand x2 for peri care, pt with felxed trunk and LEs througout with inability to correct with cues, Pt able to squat pivot EOB<>BSC with max a +2 with pt demonstrating fair hand placement reaching fro arm rests and bedrail on return    Ambulation/Gait               General Gait Details: Unable   Stairs             Wheelchair Mobility    Modified Rankin (Stroke Patients Only) Modified Rankin (Stroke Patients Only) Pre-Morbid Rankin Score: Moderate disability Modified Rankin: Severe disability     Balance Overall balance assessment: Needs assistance Sitting-balance support: Feet supported, No upper  extremity supported Sitting balance-Leahy Scale:  Fair Sitting balance - Comments: pt able to maintain ~10 mins EOB without assist and maintain sitting upright on BSC for ~5 mins   Standing balance support: Bilateral upper extremity supported Standing balance-Leahy Scale: Zero Standing balance comment: max a +2 to maintain upright standign                            Cognition Arousal/Alertness: Awake/alert Behavior During Therapy: Flat affect Overall Cognitive Status: No family/caregiver present to determine baseline cognitive functioning                                 General Comments: Hard to understand, mostly grunting to yes/no questions with intermittent one-two word responses. Decreased command following        Exercises      General Comments        Pertinent Vitals/Pain Pain Assessment Pain Assessment: Faces Faces Pain Scale: No hurt Pain Intervention(s): Monitored during session    Home Living                          Prior Function            PT Goals (current goals can now be found in the care plan section) Acute Rehab PT Goals Patient Stated Goal: not stated PT Goal Formulation: With patient Time For Goal Achievement: 10/23/22 Progress towards PT goals: Progressing toward goals    Frequency    Min 2X/week      PT Plan Current plan remains appropriate    Co-evaluation PT/OT/SLP Co-Evaluation/Treatment: Yes Reason for Co-Treatment: Complexity of the patient's impairments (multi-system involvement);For patient/therapist safety;To address functional/ADL transfers PT goals addressed during session: Mobility/safety with mobility        AM-PAC PT "6 Clicks" Mobility   Outcome Measure  Help needed turning from your back to your side while in a flat bed without using bedrails?: A Lot Help needed moving from lying on your back to sitting on the side of a flat bed without using bedrails?: Total Help needed moving to and from a bed to a chair (including a  wheelchair)?: Total Help needed standing up from a chair using your arms (e.g., wheelchair or bedside chair)?: Total Help needed to walk in hospital room?: Total Help needed climbing 3-5 steps with a railing? : Total 6 Click Score: 7    End of Session Equipment Utilized During Treatment: Gait belt Activity Tolerance: Patient limited by fatigue Patient left: in bed;with call bell/phone within reach;with bed alarm set Nurse Communication: Mobility status PT Visit Diagnosis: Other abnormalities of gait and mobility (R26.89);Muscle weakness (generalized) (M62.81);Hemiplegia and hemiparesis Hemiplegia - Right/Left: Left Hemiplegia - dominant/non-dominant: Non-dominant Hemiplegia - caused by: Cerebral infarction     Time: 0936-1000 PT Time Calculation (min) (ACUTE ONLY): 24 min  Charges:  $Therapeutic Activity: 8-22 mins                     Shaqueta Casady R. PTA Acute Rehabilitation Services Office: [phone]    Catalina Antigua 10/12/2022, 11:23 AM

## 2022-10-13 DIAGNOSIS — I639 Cerebral infarction, unspecified: Secondary | ICD-10-CM | POA: Diagnosis not present

## 2022-10-13 LAB — GLUCOSE, CAPILLARY
Glucose-Capillary: 87 mg/dL (ref 70–99)
Glucose-Capillary: 91 mg/dL (ref 70–99)
Glucose-Capillary: 116 mg/dL — ABNORMAL HIGH (ref 70–99)
Glucose-Capillary: 99 mg/dL (ref 70–99)
Glucose-Capillary: 140 mg/dL — ABNORMAL HIGH (ref 70–99)
Glucose-Capillary: 113 mg/dL — ABNORMAL HIGH (ref 70–99)

## 2022-10-13 LAB — BASIC METABOLIC PANEL
Anion gap: 7 (ref 5–15)
BUN: 68 mg/dL — ABNORMAL HIGH (ref 8–23)
CO2: 18 mmol/L — ABNORMAL LOW (ref 22–32)
Calcium: 7.4 mg/dL — ABNORMAL LOW (ref 8.9–10.3)
Chloride: 109 mmol/L (ref 98–111)
Creatinine, Ser: 1.67 mg/dL — ABNORMAL HIGH (ref 0.61–1.24)
GFR, Estimated: 46 mL/min — ABNORMAL LOW (ref >60)
Glucose, Bld: 99 mg/dL (ref 70–99)
Potassium: 3.9 mmol/L (ref 3.5–5.1)
Sodium: 134 mmol/L — ABNORMAL LOW (ref 135–145)

## 2022-10-13 LAB — CBC
HCT: 24.1 % — ABNORMAL LOW (ref 39.0–52.0)
Hemoglobin: 8.3 g/dL — ABNORMAL LOW (ref 13.0–17.0)
MCH: 35.5 pg — ABNORMAL HIGH (ref 26.0–34.0)
MCHC: 34.4 g/dL (ref 30.0–36.0)
MCV: 103 fL — ABNORMAL HIGH (ref 80.0–100.0)
Platelets: 83 10*3/uL — ABNORMAL LOW (ref 150–400)
RBC: 2.34 MIL/uL — ABNORMAL LOW (ref 4.22–5.81)
RDW: 19 % — ABNORMAL HIGH (ref 11.5–15.5)
WBC: 7.6 10*3/uL (ref 4.0–10.5)
nRBC: 0 % (ref 0.0–0.2)

## 2022-10-13 NOTE — Plan of Care (Signed)

## 2022-10-13 NOTE — Progress Notes (Signed)
[facility] PROGRESS NOTE   Jacob Bass WJX:[id number] DOB: [birth year] DOA: 09/10/2022  PCP: Donia Ast., MD  Brief History/Interval Summary:  62 y.o. male with PMH significant for history of stroke with residual left-sided deficits, HTN, liver cirrhosis, chronic anemia, COPD, recent diagnosis of COVID who lives at [facility]. On 1/8, patient was sent to the ED for altered mental status and weakness.  Last known normal the previous night. MRI brain positive for small acute infarct. Because of stroke, patient has dysphagia, expressive aphasia and worsening of left-sided deficits.   His hospital course has been complicated by recurrent ascites and dysphagia.  He has required multiple paracentesis.  Started on nasogastric tube feedings. Not candidate for PEG tube placement due to recurrent ascites. Feedings had to be held for 2 days due to recurrent aspirations and tube clogging.  Now on nocturnal feeds. Palliative care consulted.  Legal guardianship is in process.  Poor prognosis. Patient remains full code.  Patient disposition continues to be complicated due to his need for DSS/Guardianship - meeting 10/11/22 preliminary hearing completed. Next meeting 10/24/22 to hopefully decide guardianship and further medical care planning. If patient's p.o. intake can improve an NG tube can be removed it would be reasonable to discharge him back to facility, he can await results of the future meeting on guardianship at facility, he does not need to be hospitalized for this decision.  Consultants: Palliative care.  Subjective/Interval History: No acute issues/events overnight - ROS limited - patient responds with one syllable responses - rarely intelligible (yes/no mostly).  Assessment/Plan:  Liver cirrhosis with anasarca/ascites/history of hepatitis C and alcohol abuse Per history, patient had generalized anasarca and worsening pedal edema since October. Presented with significant ascites and  has required multiple paracentesis so far during this admission. Last paracentesis borrows on 1/31 with drainage of 3.1 L of hazy yellow liquid. LFTs have been stable. Furosemide ongoing, 3L paracentesis successful 2/9 - no complications Ultrasound showed cirrhotic appearance of liver. Patient with previous history of alcohol abuse and history of hepatitis C.  Please note that patient has another medical record MRN [id number].   Acute GI bleeding in the setting of liver cirrhosis, resolved Acute blood loss anemia, stable On 1/24, patient had an episode of hematochezia, hematemesis.  Tube feeding was stopped.  GI consultation was obtained.  Patient was started on Protonix. GI bleeding seems to have stabilized.  Hemoglobin roughly stable.  No EGD at this time per GI.  Tube feedings were reinitiated. Hemoglobin is stable.  No evidence for overt bleeding.   Acute CVA/Prior CVA with left-sided deficits Primarily brought to the ED after patient was noted to be less interactive compared to her baseline. MRI showed left acute CVA. Stroke workup completed.  Echo showed EF of 55 to 60% with no cardiac source of embolism A1c 4.5, LDL 74 Because of active bleeding, patient is currently not on any antithrombotic or anticoagulant. Statin not started because of underlying liver cirrhosis Current deficit include expressive aphasia and dysphagia.  Still has left-sided weakness.   Oropharyngeal dysphagia Secondary to stroke  Currently on nocturnal nasogastric tube feedings. Not a candidate for PEG tube due to recurrent ascites Dysphagia 1 (puree);Honey-thick liquid ongoing  Hypoglycemia, chronic Continues to have hypoglycemia in the morning despite nocturnal feeds, patient is not on any insulin or diabetic medications that could cause his recurrent hypoglycemia. Will discuss with dietary, may need to increase tube feeds overnight to offset his hypoglycemia.  Hypernatremia, resolved Sodium level peaked  at 158 Continue free water flushes per dietary, currently well-controlled  Aspiration pneumonitis versus pneumonia On 1/20, patient vomited. Tube feeding was stopped.   Completed ceftriaxone - Respiratory status is stable.   Acute hepatic encephalopathy, improved Ammonia level was initially 99 improved and then worsened again to become 133.  Continue with lactulose.   Ammonia level 46 on 2/5.  Mentation is stable and likely at baseline.     Acute kidney injury/hypokalemia, resolving Baseline creatinine around 1.   Presented with creatinine of 2.08, peaked at 2.12.  Creatinine likely at new baseline around 1.9 Continue torsemide   COPD Without acute exacerbation  Continue bronchodilators   BPH Flomax   Impaired mobility PT evaluation obtained.  SNF recommended   Goals of care Palliative care is following.  DSS is involved but no legal guardian yet. [facility] for guardianship [name], [phone]). On 1/29, palliative care reached out to DSS again.  They were told that petition was filed on 1/29 for guardianship.   Ethics was involved.  Remains full code as of now.  Moderate calorie malnutrition Nutrition Problem: Moderate Malnutrition Etiology: chronic illness  DVT Prophylaxis: SCDs Code Status: Full code Family Communication: None available, no legal guardian Disposition Plan: SNF when medically stable.  Status is: Inpatient Remains inpatient appropriate because: Oropharyngeal dysphagia requiring NG tube feedings  Medications: Scheduled:  feeding supplement (JEVITY 1.5 CAL/FIBER)  1,000 mL Per Tube Q24H   feeding supplement (PROSource TF20)  60 mL Per Tube Daily   fiber supplement (BANATROL TF)  60 mL Per Tube BID   free water  300 mL Per Tube Q4H   furosemide  40 mg Intravenous Daily   lactulose  10 g Per Tube BID   latanoprost  1 drop Both Eyes QHS   lidocaine (PF)  15 mL Intradermal Once   lidocaine (PF)  5 mL Intradermal Once    metoprolol tartrate  25 mg Per Tube BID   pantoprazole (PROTONIX) IV  40 mg Intravenous Q24H   tamsulosin  0.4 mg Oral Daily   Continuous:   PRN:[DISCONTINUED] acetaminophen **OR** acetaminophen (TYLENOL) oral liquid 160 mg/5 mL **OR** acetaminophen, dextrose, Gerhardt's butt cream, ipratropium-albuterol, ondansetron (ZOFRAN) IV, senna-docusate  Antibiotics: Anti-infectives (From admission, onward)    Start     Dose/Rate Route Frequency Ordered Stop   10/02/22 1000  cefTRIAXone (ROCEPHIN) 1 g in sodium chloride 0.9 % 100 mL IVPB        1 g 200 mL/hr over 30 Minutes Intravenous Every 24 hours 10/02/22 0855 10/08/22 1311   09/10/22 1345  cefTRIAXone (ROCEPHIN) 1 g in sodium chloride 0.9 % 100 mL IVPB        1 g 200 mL/hr over 30 Minutes Intravenous  Once 09/10/22 1331 09/10/22 1605   09/10/22 1345  azithromycin (ZITHROMAX) 500 mg in sodium chloride 0.9 % 250 mL IVPB        500 mg 250 mL/hr over 60 Minutes Intravenous  Once 09/10/22 1331 09/10/22 1605       Objective:  Vital Signs  Vitals:   10/12/22 2320 10/13/22 0356 10/13/22 0445 10/13/22 0734  BP: (!) 110/52 103/67  (!) 118/54  Pulse: 65 73  79  Resp:    18  Temp: 97.6 F (36.4 C) 97.8 F (36.6 C)  98 F (36.7 C)  TempSrc: Oral Oral  Oral  SpO2: 100% 98%  100%  Weight:   64.7 kg   Height:        Intake/Output Summary (Last  24 hours) at 10/13/2022 0750 Last data filed at 10/13/2022 0700 Gross per 24 hour  Intake 1600 ml  Output 1150 ml  Net 450 ml    Filed Weights   10/11/22 0500 10/12/22 0441 10/13/22 0445  Weight: 66.3 kg 64.1 kg 64.7 kg    General appearance: Awake alert.  In no distress NG tube is noted. Resp: Clear to auscultation bilaterally.  Normal effort Cardio: S1-S2 is normal regular.  No S3-S4.  No rubs murmurs or bruit GI: Abdomen is distended with ascites.  Nontender.  Bowel sounds present.  Extremities: No edema.   Left Hemiparesis.   Lab Results:  Data Reviewed: I have personally  reviewed following labs and reports of the imaging studies  CBC: Recent Labs  Lab 10/08/22 0328 10/11/22 0440 10/13/22 0539  WBC 9.8 8.7 7.6  HGB 10.2* 9.6* 8.3*  HCT 33.3* 29.3* 24.1*  MCV 112.9* 106.5* 103.0*  PLT 109* 91* 83*     Basic Metabolic Panel: Recent Labs  Lab 10/08/22 0328 10/09/22 0611 10/10/22 0552 10/11/22 0440 10/13/22 0539  NA 138 137 137 137 134*  K 3.9 3.7 3.8 4.1 3.9  CL 115* 113* 110 111 109  CO2 15* 18* 18* 17* 18*  GLUCOSE 89 94 114* 104* 99  BUN 51* 58* 64* 66* 68*  CREATININE 1.71* 1.81* 1.84* 1.76* 1.67*  CALCIUM 7.4* 7.6* 7.5* 7.6* 7.4*     GFR: Estimated Creatinine Clearance: 41.4 mL/min (A) (by C-G formula based on SCr of 1.67 mg/dL (H)).  Liver Function Tests: No results for input(s): "AST", "ALT", "ALKPHOS", "BILITOT", "PROT", "ALBUMIN" in the last 168 hours.   Recent Labs  Lab 10/08/22 0329  AMMONIA 46*     Coagulation Profile: No results for input(s): "INR", "PROTIME" in the last 168 hours.    CBG: Recent Labs  Lab 10/12/22 1155 10/12/22 1555 10/12/22 1936 10/12/22 2321 10/13/22 0356  GLUCAP 105* 91 86 97 87      Radiology Studies: IR Paracentesis  Result Date: 10/11/2022 INDICATION: Patient with a history of cirrhosis with recurrent ascites. Interventional radiology asked to perform a therapeutic paracentesis. EXAM: ULTRASOUND GUIDED PARACENTESIS MEDICATIONS: 1% lidocaine 10 mL COMPLICATIONS: None immediate. PROCEDURE: Informed written consent was obtained from the patient after a discussion of the risks, benefits and alternatives to treatment. A timeout was performed prior to the initiation of the procedure. Initial ultrasound scanning demonstrates a large amount of ascites within the right lower abdominal quadrant. The right lower abdomen was prepped and draped in the usual sterile fashion. 1% lidocaine was used for local anesthesia. Following this, a 19 gauge, 7-cm, Yueh catheter was introduced. An ultrasound  image was saved for documentation purposes. The paracentesis was performed. The catheter was removed and a dressing was applied. The patient tolerated the procedure well without immediate post procedural complication. FINDINGS: A total of approximately 3 L of clear yellow fluid was removed. IMPRESSION: Successful ultrasound-guided paracentesis yielding 3 liters of peritoneal fluid. Read by: Alwyn Ren, NP Electronically Signed   By: Richarda Overlie M.D.   On: 10/11/2022 18:54       LOS: 32 days   [name] DO  [facility] Pager on [email or web address]  10/13/2022, 7:50 AM

## 2022-10-14 DIAGNOSIS — I639 Cerebral infarction, unspecified: Secondary | ICD-10-CM | POA: Diagnosis not present

## 2022-10-14 LAB — GLUCOSE, CAPILLARY
Glucose-Capillary: 100 mg/dL — ABNORMAL HIGH (ref 70–99)
Glucose-Capillary: 69 mg/dL — ABNORMAL LOW (ref 70–99)
Glucose-Capillary: 80 mg/dL (ref 70–99)
Glucose-Capillary: 87 mg/dL (ref 70–99)
Glucose-Capillary: 90 mg/dL (ref 70–99)
Glucose-Capillary: 94 mg/dL (ref 70–99)

## 2022-10-14 NOTE — Progress Notes (Signed)
[facility] PROGRESS NOTE   Brylon Alfonse Ras [id number] DOB: May 13, 1961 DOA: 09/10/2022  PCP: Donia Ast., MD  Brief History/Interval Summary:  62 y.o. male with PMH significant for history of stroke with residual left-sided deficits, HTN, liver cirrhosis, chronic anemia, COPD, recent diagnosis of COVID who lives at [facility]. On 1/8, patient was sent to the ED for altered mental status and weakness.  Last known normal the previous night. MRI brain positive for small acute infarct. Because of stroke, patient has dysphagia, expressive aphasia and worsening of left-sided deficits.   His hospital course has been complicated by recurrent ascites and dysphagia.  He has required multiple paracentesis.  Started on nasogastric tube feedings. Not candidate for PEG tube placement due to recurrent ascites. Feedings had to be held for 2 days due to recurrent aspirations and tube clogging.  Now on nocturnal feeds. Palliative care consulted.  Legal guardianship is in process.  Poor prognosis. Patient remains full code.  Patient disposition continues to be complicated due to his need for DSS/Guardianship - meeting 10/11/22 preliminary hearing completed. Next meeting 10/24/22 to hopefully decide guardianship and further medical care planning. If patient's p.o. intake can improve an NG tube can be removed it would be reasonable to discharge him back to facility, he can await results of the future meeting on guardianship at facility, he does not need to be hospitalized for this decision.  Consultants: Palliative care.  Subjective/Interval History: No acute issues/events overnight - ROS limited - patient responds with one syllable responses - rarely intelligible (yes/no mostly).  Assessment/Plan:  Liver cirrhosis with anasarca/ascites/history of hepatitis C and alcohol abuse Per history, patient had generalized anasarca and worsening pedal edema since October. Presented with significant ascites and  has required multiple paracentesis so far during this admission. Last paracentesis borrows on 1/31 with drainage of 3.1 L of hazy yellow liquid. LFTs have been stable. Furosemide ongoing, 3L paracentesis successful 2/9 - no complications Ultrasound showed cirrhotic appearance of liver. Patient with previous history of alcohol abuse and history of hepatitis C.  Please note that patient has another medical record MRN [id number].   Acute GI bleeding in the setting of liver cirrhosis, resolved Acute blood loss anemia, stable On 1/24, patient had an episode of hematochezia, hematemesis.  Tube feeding was stopped.  GI consultation was obtained.  Patient was started on Protonix. GI bleeding seems to have stabilized.  Hemoglobin roughly stable.  No EGD at this time per GI.  Tube feedings were reinitiated. Hemoglobin is stable.  No evidence for overt bleeding.   Acute CVA/Prior CVA with left-sided deficits Primarily brought to the ED after patient was noted to be less interactive compared to her baseline. MRI showed left acute CVA. Stroke workup completed.  Echo showed EF of 55 to 60% with no cardiac source of embolism A1c 4.5, LDL 74 Because of active bleeding, patient is currently not on any antithrombotic or anticoagulant. Statin not started because of underlying liver cirrhosis Current deficit include expressive aphasia and dysphagia.  Still has left-sided weakness.   Oropharyngeal dysphagia Secondary to stroke  Holding nocturnal feeds given increased PO intake. Dysphagia 1 (puree);Honey-thick liquid ongoing Not a candidate for PEG tube due to recurrent ascites PO intake labile - tolerating food well over the 9/10th but today minimal PO intake this morning/afternoon. Continue to progress as possible.  Hypoglycemia, chronic Continues to have hypoglycemia in the morning despite nocturnal feeds, patient is not on any insulin or diabetic medications that could cause his recurrent  hypoglycemia. Will discuss with dietary, may need to increase tube feeds overnight to offset his hypoglycemia.  Hypernatremia, resolved Sodium level peaked at 158 Continue free water flushes per dietary, currently well-controlled  Aspiration pneumonitis versus pneumonia On 1/20, patient vomited. Tube feeding was stopped.   Completed ceftriaxone - Respiratory status is stable.   Acute hepatic encephalopathy, improved Ammonia level was initially 99 improved and then worsened again to become 133.  Continue with lactulose.   Ammonia level 46 on 2/5.  Mentation is stable and likely at baseline.     Acute kidney injury/hypokalemia, resolving Baseline creatinine around 1.   Presented with creatinine of 2.08, peaked at 2.12.  Creatinine likely at new baseline around 1.9 Continue torsemide   COPD Without acute exacerbation  Continue bronchodilators   BPH Flomax   Impaired mobility PT evaluation obtained.  SNF recommended   Goals of care Palliative care is following.  DSS is involved but no legal guardian yet. [facility] for guardianship [name], [phone]). On 1/29, palliative care reached out to DSS again.  They were told that petition was filed on 1/29 for guardianship.   Ethics was involved.  Remains full code as of now.  Moderate calorie malnutrition Nutrition Problem: Moderate Malnutrition Etiology: chronic illness  DVT Prophylaxis: SCDs Code Status: Full code Family Communication: None available, no legal guardian Disposition Plan: SNF when medically stable.  Status is: Inpatient Remains inpatient appropriate because: Oropharyngeal dysphagia requiring NG tube feedings  Medications: Scheduled:  feeding supplement (JEVITY 1.5 CAL/FIBER)  1,000 mL Per Tube Q24H   feeding supplement (PROSource TF20)  60 mL Per Tube Daily   fiber supplement (BANATROL TF)  60 mL Per Tube BID   free water  300 mL Per Tube Q4H   furosemide  40 mg Intravenous Daily    lactulose  10 g Per Tube BID   latanoprost  1 drop Both Eyes QHS   lidocaine (PF)  15 mL Intradermal Once   lidocaine (PF)  5 mL Intradermal Once   metoprolol tartrate  25 mg Per Tube BID   pantoprazole (PROTONIX) IV  40 mg Intravenous Q24H   tamsulosin  0.4 mg Oral Daily   Continuous:   PRN:[DISCONTINUED] acetaminophen **OR** acetaminophen (TYLENOL) oral liquid 160 mg/5 mL **OR** acetaminophen, dextrose, Gerhardt's butt cream, ipratropium-albuterol, ondansetron (ZOFRAN) IV, senna-docusate  Antibiotics: Anti-infectives (From admission, onward)    Start     Dose/Rate Route Frequency Ordered Stop   10/02/22 1000  cefTRIAXone (ROCEPHIN) 1 g in sodium chloride 0.9 % 100 mL IVPB        1 g 200 mL/hr over 30 Minutes Intravenous Every 24 hours 10/02/22 0855 10/08/22 1311   09/10/22 1345  cefTRIAXone (ROCEPHIN) 1 g in sodium chloride 0.9 % 100 mL IVPB        1 g 200 mL/hr over 30 Minutes Intravenous  Once 09/10/22 1331 09/10/22 1605   09/10/22 1345  azithromycin (ZITHROMAX) 500 mg in sodium chloride 0.9 % 250 mL IVPB        500 mg 250 mL/hr over 60 Minutes Intravenous  Once 09/10/22 1331 09/10/22 1605       Objective:  Vital Signs  Vitals:   10/13/22 2019 10/13/22 2311 10/14/22 0317 10/14/22 0500  BP: (!) 109/42 132/69 101/63   Pulse: 70 78 74   Resp: 18 16 16    Temp: 98.6 F (37 C) 97.9 F (36.6 C) 97.9 F (36.6 C)   TempSrc: Axillary  Oral   SpO2: 100% 99%  100%   Weight:    65.2 kg  Height:        Intake/Output Summary (Last 24 hours) at 10/14/2022 0757 Last data filed at 10/14/2022 0518 Gross per 24 hour  Intake 1800 ml  Output 450 ml  Net 1350 ml    Filed Weights   10/12/22 0441 10/13/22 0445 10/14/22 0500  Weight: 64.1 kg 64.7 kg 65.2 kg    General appearance: Awake alert.  In no distress NG tube is noted. Resp: Clear to auscultation bilaterally.  Normal effort Cardio: S1-S2 is normal regular.  No S3-S4.  No rubs murmurs or bruit GI: Abdomen is distended  with ascites.  Nontender.  Bowel sounds present.  Extremities: No edema.   Left Hemiparesis.   Lab Results:  Data Reviewed: I have personally reviewed following labs and reports of the imaging studies  CBC: Recent Labs  Lab 10/08/22 0328 10/11/22 0440 10/13/22 0539  WBC 9.8 8.7 7.6  HGB 10.2* 9.6* 8.3*  HCT 33.3* 29.3* 24.1*  MCV 112.9* 106.5* 103.0*  PLT 109* 91* 83*     Basic Metabolic Panel: Recent Labs  Lab 10/08/22 0328 10/09/22 0611 10/10/22 0552 10/11/22 0440 10/13/22 0539  NA 138 137 137 137 134*  K 3.9 3.7 3.8 4.1 3.9  CL 115* 113* 110 111 109  CO2 15* 18* 18* 17* 18*  GLUCOSE 89 94 114* 104* 99  BUN 51* 58* 64* 66* 68*  CREATININE 1.71* 1.81* 1.84* 1.76* 1.67*  CALCIUM 7.4* 7.6* 7.5* 7.6* 7.4*     GFR: Estimated Creatinine Clearance: 41.8 mL/min (A) (by C-G formula based on SCr of 1.67 mg/dL (H)).  Liver Function Tests: No results for input(s): "AST", "ALT", "ALKPHOS", "BILITOT", "PROT", "ALBUMIN" in the last 168 hours.   Recent Labs  Lab 10/08/22 0329  AMMONIA 46*     Coagulation Profile: No results for input(s): "INR", "PROTIME" in the last 168 hours.    CBG: Recent Labs  Lab 10/13/22 1654 10/13/22 2023 10/13/22 2310 10/14/22 0318 10/14/22 0748  GLUCAP 99 140* 113* 100* 80      Radiology Studies: No results found.     LOS: 33 days   [name] DO  [facility] Pager on [email or web address]  10/14/2022, 7:57 AM

## 2022-10-14 NOTE — Progress Notes (Signed)
Speech Language Pathology Treatment: Dysphagia  Patient Details Name: Cote Sandor MRN: [id number] DOB: 1961/08/11 Today's Date: 10/14/2022 Time: [id number] SLP Time Calculation (min) (ACUTE ONLY): 15 min  Assessment / Plan / Recommendation Clinical Impression  Patient seen by SLP for skilled treatment focused on dysphagia goals. Patient was awake, alert and receptive to having some honey thick sweet tea. SLP provided assistance with repositioning patient in bed as he leans heavily to left/weak side and requires moderate amount of verbal and tactile cues to redirect as he will push himself more towards left instead of pulling. After getting patient in adequate upright position, SLP handed him cup and he was able to give self sips, with only mild amount of left anterior spillage. He presented with oral transit delays, suspected holding of bolus in mouth, delayed swallow initiation and delayed coughing. After approximately four sips, patient handed cup to SLP. He seemed fixated on pulling covers up over his head (this is how he is typically found when SLP enters room) and would not respond when SLP asking him what he needed/wanted. SLP recommending continue with Dys 1(puree) solids and honey thick liquids and encourage patient to eat/drink. SLP will continue to follow.    HPI HPI: Allin Donez is a 62 y.o. male who presented to ED from [facility] via EMS with left sided weakness, speech changes, wheezing. PMH/PSH includes prior CVA, subarachnoid hemorrhage, cirrhosis, hep C, hx of etOH abuse, dementia, COPD.  Pt known to SLP from prior admissions, with baseline dysarthria and oral dysphagia. He was most recently seen by SLP on 04/13/22 with recommendations for Dysphagia 1 solids and thin liquids. NPO with Cortrak, can't get PEG due to acities. ST has seen him for therapy consistently with improvements and he is now appropriate for his second MBS.      SLP Plan  Continue with current plan of care       Recommendations for follow up therapy are one component of a multi-disciplinary discharge planning process, led by the attending physician.  Recommendations may be updated based on patient status, additional functional criteria and insurance authorization.    Recommendations  Diet recommendations: Dysphagia 1 (puree);Honey-thick liquid Liquids provided via: Cup Medication Administration: Crushed with puree Supervision: Patient able to self feed;Staff to assist with self feeding;Full supervision/cueing for compensatory strategies Compensations: Minimize environmental distractions;Slow rate;Small sips/bites;Lingual sweep for clearance of pocketing Postural Changes and/or Swallow Maneuvers: Seated upright 90 degrees                Oral Care Recommendations: Oral care BID Follow Up Recommendations: Skilled nursing-short term rehab (<3 hours/day) Assistance recommended at discharge: Frequent or constant Supervision/Assistance SLP Visit Diagnosis: Dysphagia, oropharyngeal phase (R13.12) Plan: Continue with current plan of care         Sonia Baller, MA, CCC-SLP Speech Therapy

## 2022-10-15 DIAGNOSIS — I639 Cerebral infarction, unspecified: Secondary | ICD-10-CM | POA: Diagnosis not present

## 2022-10-15 LAB — CBC
HCT: 26.1 % — ABNORMAL LOW (ref 39.0–52.0)
Hemoglobin: 9 g/dL — ABNORMAL LOW (ref 13.0–17.0)
MCH: 35.2 pg — ABNORMAL HIGH (ref 26.0–34.0)
MCHC: 34.5 g/dL (ref 30.0–36.0)
MCV: 102 fL — ABNORMAL HIGH (ref 80.0–100.0)
Platelets: 80 10*3/uL — ABNORMAL LOW (ref 150–400)
RBC: 2.56 MIL/uL — ABNORMAL LOW (ref 4.22–5.81)
RDW: 18.6 % — ABNORMAL HIGH (ref 11.5–15.5)
WBC: 6.8 10*3/uL (ref 4.0–10.5)
nRBC: 0 % (ref 0.0–0.2)

## 2022-10-15 LAB — BASIC METABOLIC PANEL
Anion gap: 10 (ref 5–15)
BUN: 76 mg/dL — ABNORMAL HIGH (ref 8–23)
CO2: 18 mmol/L — ABNORMAL LOW (ref 22–32)
Calcium: 8 mg/dL — ABNORMAL LOW (ref 8.9–10.3)
Chloride: 112 mmol/L — ABNORMAL HIGH (ref 98–111)
Creatinine, Ser: 1.67 mg/dL — ABNORMAL HIGH (ref 0.61–1.24)
GFR, Estimated: 46 mL/min — ABNORMAL LOW (ref >60)
Glucose, Bld: 76 mg/dL (ref 70–99)
Potassium: 3.5 mmol/L (ref 3.5–5.1)
Sodium: 140 mmol/L (ref 135–145)

## 2022-10-15 LAB — GLUCOSE, CAPILLARY
Glucose-Capillary: 102 mg/dL — ABNORMAL HIGH (ref 70–99)
Glucose-Capillary: 105 mg/dL — ABNORMAL HIGH (ref 70–99)
Glucose-Capillary: 65 mg/dL — ABNORMAL LOW (ref 70–99)
Glucose-Capillary: 84 mg/dL (ref 70–99)
Glucose-Capillary: 90 mg/dL (ref 70–99)

## 2022-10-15 NOTE — Progress Notes (Signed)
Speech Language Pathology Treatment: Dysphagia  Patient Details Name: Jacob Bass MRN: [id number] DOB: 17-May-1961 Today's Date: 10/15/2022 Time: [id number] SLP Time Calculation (min) (ACUTE ONLY): 15 min  Assessment / Plan / Recommendation Clinical Impression  Pt able to self feed using right hand with intermittent assist for scooping and targeting spoon to lips. His attention to feeding was better today with less distractibility. He was able to initiate swallows with delayed oral manipulation and transit and noted increased anterior spill and mildly increased oral residue on left. There were signs of possible aspiration and/or penetration with puree indicated by mildly delayed strong coughs x 2 (none with honey thick) which has been consistent over previous sessions. He still has Cortrak and uncertain of what his intake has been. Staff to give pt full supervision/assist and encourage pt to eat as much as possible from trays.    HPI HPI: Jacob Bass is a 62 y.o. male who presented to ED from [facility] via EMS with left sided weakness, speech changes, wheezing. PMH/PSH includes prior CVA, subarachnoid hemorrhage, cirrhosis, hep C, hx of etOH abuse, dementia, COPD.  Pt known to SLP from prior admissions, with baseline dysarthria and oral dysphagia. He was most recently seen by SLP on 04/13/22 with recommendations for Dysphagia 1 solids and thin liquids. NPO with Cortrak, can't get PEG due to acities. ST has seen him for therapy consistently with improvements and he is now appropriate for his second MBS.      SLP Plan  Continue with current plan of care      Recommendations for follow up therapy are one component of a multi-disciplinary discharge planning process, led by the attending physician.  Recommendations may be updated based on patient status, additional functional criteria and insurance authorization.    Recommendations  Diet recommendations: Honey-thick liquid;Dysphagia 1  (puree) Liquids provided via: Cup Medication Administration: Crushed with puree Supervision: Patient able to self feed;Staff to assist with self feeding;Full supervision/cueing for compensatory strategies Compensations: Minimize environmental distractions;Slow rate;Small sips/bites;Lingual sweep for clearance of pocketing;Monitor for anterior loss Postural Changes and/or Swallow Maneuvers: Seated upright 90 degrees                Oral Care Recommendations: Oral care BID Follow Up Recommendations: Skilled nursing-short term rehab (<3 hours/day) Assistance recommended at discharge: Frequent or constant Supervision/Assistance SLP Visit Diagnosis: Dysphagia, oropharyngeal phase (R13.12) Plan: Continue with current plan of care           Houston Siren  10/15/2022, 12:06 PM

## 2022-10-15 NOTE — Progress Notes (Signed)
[facility] PROGRESS NOTE   Jacob Bass UJW:[id number] DOB: 05/27/1961 DOA: 09/10/2022  PCP: Donia Ast., MD  Brief History/Interval Summary:  62 y.o. male with PMH significant for history of stroke with residual left-sided deficits, HTN, liver cirrhosis, chronic anemia, COPD, recent diagnosis of COVID who lives at [facility]. On 1/8, patient was sent to the ED for altered mental status and weakness.  Last known normal the previous night. MRI brain positive for small acute infarct. Because of stroke, patient has dysphagia, expressive aphasia and worsening of left-sided deficits.   His hospital course has been complicated by recurrent ascites and dysphagia.  He has required multiple paracentesis.  Started on nasogastric tube feedings. Not candidate for PEG tube placement due to recurrent ascites. Feedings had to be held for 2 days due to recurrent aspirations and tube clogging.  Now on nocturnal feeds. Palliative care consulted.  Legal guardianship is in process.  Poor prognosis. Patient remains full code.  Patient disposition continues to be complicated due to his need for DSS/Guardianship - meeting 10/11/22 preliminary hearing completed. Next meeting 10/24/22 to hopefully decide guardianship and further medical care planning. If patient's p.o. intake can improve an NG tube can be removed it would be reasonable to discharge him back to facility, he can await results of the future meeting on guardianship at facility, he does not need to be hospitalized for this decision. Once guardianship has been confirmed may need to discuss long term goals of care given he cannot have PEG tube (secondary to ascites).  Consultants: Palliative care.  Subjective/Interval History: No acute issues/events overnight - ROS limited - patient responds with one syllable responses/grunts - rarely intelligible (yes/no mostly).  Assessment/Plan:  Liver cirrhosis with anasarca/ascites/history of hepatitis C and  alcohol abuse Per history, patient had generalized anasarca and worsening pedal edema since October. Presented with significant ascites and has required multiple paracentesis so far during this admission. Last paracentesis borrows on 1/31 with drainage of 3.1 L of hazy yellow liquid. LFTs have been stable. Furosemide ongoing, 3L paracentesis successful 2/9 - no complications Ultrasound showed cirrhotic appearance of liver. Patient with previous history of alcohol abuse and history of hepatitis C.  Please note that patient has another medical record MRN [id number].   Acute GI bleeding in the setting of liver cirrhosis, resolved Acute blood loss anemia, stable On 1/24, patient had an episode of hematochezia, hematemesis.  Tube feeding was stopped.  GI consultation was obtained.  Patient was started on Protonix. GI bleeding seems to have stabilized.  Hemoglobin roughly stable.  No EGD at this time per GI.  Tube feedings were reinitiated. Hemoglobin is stable.  No evidence for overt bleeding.   Acute CVA/Prior CVA with left-sided deficits Primarily brought to the ED after patient was noted to be less interactive compared to her baseline. MRI showed left acute CVA. Stroke workup completed.  Echo showed EF of 55 to 60% with no cardiac source of embolism A1c 4.5, LDL 74 Because of active bleeding, patient is currently not on any antithrombotic or anticoagulant. Statin not started because of underlying liver cirrhosis Current deficit include expressive aphasia and dysphagia.  Still has left-sided weakness.   Oropharyngeal dysphagia Secondary to stroke  Holding nocturnal feeds given increased PO intake. Dysphagia 1 (puree);Honey-thick liquid ongoing Not a candidate for PEG tube due to recurrent ascites PO intake labile - tolerating food well over the 9/10th but minimal PO intake on the 11th. Continue to progress as possible.  Hypoglycemia, chronic Continues  to have hypoglycemia in the morning  despite nocturnal feeds, patient is not on any insulin or diabetic medications that could cause his recurrent hypoglycemia. Will discuss with dietary, may need to increase tube feeds overnight to offset his hypoglycemia.  Hypernatremia, resolved Sodium level peaked at 158 Continue free water flushes per dietary, currently well-controlled  Aspiration pneumonitis versus pneumonia On 1/20, patient vomited. Tube feeding was stopped.   Completed ceftriaxone - Respiratory status is stable.   Acute hepatic encephalopathy, improved Ammonia level was initially 99 improved and then worsened again to become 133.  Continue with lactulose.   Ammonia level 46 on 2/5.  Mentation is stable and likely at baseline.     Acute kidney injury/hypokalemia, resolving Baseline creatinine around 1.   Presented with creatinine of 2.08, peaked at 2.12.  Creatinine likely at new baseline around 1.9 Continue torsemide   COPD Without acute exacerbation  Continue bronchodilators   BPH Flomax   Impaired mobility PT evaluation obtained.  SNF recommended   Goals of care Palliative care is following.  DSS is involved but no legal guardian yet. [facility] for guardianship [name], [phone]). On 1/29, palliative care reached out to DSS again.  They were told that petition was filed on 1/29 for guardianship.   Ethics was involved.  Remains full code as of now.  Moderate calorie malnutrition Nutrition Problem: Moderate Malnutrition Etiology: chronic illness  DVT Prophylaxis: SCDs Code Status: Full code Family Communication: None available, no legal guardian Disposition Plan: SNF when medically stable.  Status is: Inpatient Remains inpatient appropriate because: Oropharyngeal dysphagia requiring NG tube feedings  Medications: Scheduled:  feeding supplement (JEVITY 1.5 CAL/FIBER)  1,000 mL Per Tube Q24H   feeding supplement (PROSource TF20)  60 mL Per Tube Daily   fiber supplement  (BANATROL TF)  60 mL Per Tube BID   free water  300 mL Per Tube Q4H   furosemide  40 mg Intravenous Daily   lactulose  10 g Per Tube BID   latanoprost  1 drop Both Eyes QHS   lidocaine (PF)  15 mL Intradermal Once   lidocaine (PF)  5 mL Intradermal Once   metoprolol tartrate  25 mg Per Tube BID   pantoprazole (PROTONIX) IV  40 mg Intravenous Q24H   tamsulosin  0.4 mg Oral Daily   Continuous:   PRN:[DISCONTINUED] acetaminophen **OR** acetaminophen (TYLENOL) oral liquid 160 mg/5 mL **OR** acetaminophen, dextrose, Gerhardt's butt cream, ipratropium-albuterol, ondansetron (ZOFRAN) IV, senna-docusate  Antibiotics: Anti-infectives (From admission, onward)    Start     Dose/Rate Route Frequency Ordered Stop   10/02/22 1000  cefTRIAXone (ROCEPHIN) 1 g in sodium chloride 0.9 % 100 mL IVPB        1 g 200 mL/hr over 30 Minutes Intravenous Every 24 hours 10/02/22 0855 10/08/22 1311   09/10/22 1345  cefTRIAXone (ROCEPHIN) 1 g in sodium chloride 0.9 % 100 mL IVPB        1 g 200 mL/hr over 30 Minutes Intravenous  Once 09/10/22 1331 09/10/22 1605   09/10/22 1345  azithromycin (ZITHROMAX) 500 mg in sodium chloride 0.9 % 250 mL IVPB        500 mg 250 mL/hr over 60 Minutes Intravenous  Once 09/10/22 1331 09/10/22 1605       Objective:  Vital Signs  Vitals:   10/14/22 1700 10/14/22 2135 10/14/22 2139 10/15/22 0732  BP: 118/89 (!) 143/68 (!) 143/68 121/70  Pulse: (!) 57 82 82 71  Resp: 18  16  16  Temp: 97.9 F (36.6 C)  98.4 F (36.9 C) 98.2 F (36.8 C)  TempSrc: Axillary  Oral Oral  SpO2: 100%  100% 100%  Weight:      Height:        Intake/Output Summary (Last 24 hours) at 10/15/2022 0756 Last data filed at 10/15/2022 0317 Gross per 24 hour  Intake 120 ml  Output 1300 ml  Net -1180 ml    Filed Weights   10/12/22 0441 10/13/22 0445 10/14/22 0500  Weight: 64.1 kg 64.7 kg 65.2 kg    General appearance: Awake alert.  In no distress NG tube is noted. Resp: Clear to  auscultation bilaterally.  Normal effort Cardio: S1-S2 is normal regular.  No S3-S4.  No rubs murmurs or bruit GI: Abdomen is distended with ascites.  Nontender.  Bowel sounds present.  Extremities: No edema.   Left Hemiparesis.   Lab Results:  Data Reviewed: I have personally reviewed following labs and reports of the imaging studies  CBC: Recent Labs  Lab 10/11/22 0440 10/13/22 0539 10/15/22 0653  WBC 8.7 7.6 6.8  HGB 9.6* 8.3* 9.0*  HCT 29.3* 24.1* 26.1*  MCV 106.5* 103.0* 102.0*  PLT 91* 83* 80*     Basic Metabolic Panel: Recent Labs  Lab 10/09/22 0611 10/10/22 0552 10/11/22 0440 10/13/22 0539  NA 137 137 137 134*  K 3.7 3.8 4.1 3.9  CL 113* 110 111 109  CO2 18* 18* 17* 18*  GLUCOSE 94 114* 104* 99  BUN 58* 64* 66* 68*  CREATININE 1.81* 1.84* 1.76* 1.67*  CALCIUM 7.6* 7.5* 7.6* 7.4*     GFR: Estimated Creatinine Clearance: 41.8 mL/min (A) (by C-G formula based on SCr of 1.67 mg/dL (H)).  Liver Function Tests: No results for input(s): "AST", "ALT", "ALKPHOS", "BILITOT", "PROT", "ALBUMIN" in the last 168 hours.   No results for input(s): "AMMONIA" in the last 168 hours.   Coagulation Profile: No results for input(s): "INR", "PROTIME" in the last 168 hours.    CBG: Recent Labs  Lab 10/14/22 1203 10/14/22 1753 10/14/22 2011 10/14/22 2330 10/15/22 0316  GLUCAP 94 69* 90 87 84      Radiology Studies: No results found.     LOS: 34 days   [name] DO  [facility] Pager on [email or web address]  10/15/2022, 7:56 AM

## 2022-10-16 DIAGNOSIS — I639 Cerebral infarction, unspecified: Secondary | ICD-10-CM | POA: Diagnosis not present

## 2022-10-16 LAB — GLUCOSE, CAPILLARY
Glucose-Capillary: 111 mg/dL — ABNORMAL HIGH (ref 70–99)
Glucose-Capillary: 70 mg/dL (ref 70–99)
Glucose-Capillary: 89 mg/dL (ref 70–99)
Glucose-Capillary: 91 mg/dL (ref 70–99)
Glucose-Capillary: 98 mg/dL (ref 70–99)

## 2022-10-16 MED ORDER — SPIRONOLACTONE 25 MG PO TABS
25.0000 mg | ORAL_TABLET | Freq: Every day | ORAL | Status: DC
  Administered 2022-10-16 – 2022-10-19 (×4): 25 mg via ORAL
  Filled 2022-10-16 (×4): qty 1

## 2022-10-16 MED ORDER — FAMOTIDINE 20 MG PO TABS
20.0000 mg | ORAL_TABLET | Freq: Two times a day (BID) | ORAL | Status: DC
  Administered 2022-10-16 – 2022-10-18 (×5): 20 mg via ORAL
  Filled 2022-10-16 (×5): qty 1

## 2022-10-16 MED ORDER — FUROSEMIDE 40 MG PO TABS
40.0000 mg | ORAL_TABLET | Freq: Two times a day (BID) | ORAL | Status: DC
  Administered 2022-10-16 – 2022-10-18 (×4): 40 mg via ORAL
  Filled 2022-10-16 (×3): qty 1

## 2022-10-16 NOTE — Progress Notes (Signed)
Physical Therapy Treatment Patient Details Name: Jacob Bass MRN: [id number] DOB: 04-04-1961 Today's Date: 10/16/2022   History of Present Illness 62 y.o. male presents to [facility] hospital on 09/10/2022 with AMS and L weakness from [facility]. 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 Complicated by fluid overload. Paracentesis performed 1/14, 1/16, 1/21, 1/27. PMH includes CVA, COPD, cirrhosis, gastric ulcer.    PT Comments    Pt progressing slowly towards his physical therapy goals. Focus of session on PROM to BLE's, bed mobility and dynamic sitting balance with self feeding. Pt requiring max-total assist for functional mobility. Continue to recommend SNF for ongoing Physical Therapy.      Recommendations for follow up therapy are one component of a multi-disciplinary discharge planning process, led by the attending physician.  Recommendations may be updated based on patient status, additional functional criteria and insurance authorization.  Follow Up Recommendations  Skilled nursing-short term rehab (<3 hours/day) Can patient physically be transported by private vehicle: No   Assistance Recommended at Discharge Frequent or constant Supervision/Assistance  Patient can return home with the following Two people to help with walking and/or transfers;Two people to help with bathing/dressing/bathroom;Assistance with cooking/housework;Assistance with feeding;Direct supervision/assist for medications management;Direct supervision/assist for financial management;Assist for transportation;Help with stairs or ramp for entrance   Equipment Recommendations  Wheelchair (measurements PT);Wheelchair cushion (measurements PT);Hospital bed;Other (comment) (hoyer lift)    Recommendations for Other Services       Precautions / Restrictions Precautions Precautions: Fall;Other (comment) Precaution Comments: cortrak Restrictions Weight Bearing Restrictions: No     Mobility  Bed Mobility Overal bed  mobility: Needs Assistance Bed Mobility: Supine to Sit, Sit to Supine Rolling: Total assist Sidelying to sit: Max assist, +2 for physical assistance   Sit to supine: Max assist, +2 for physical assistance   General bed mobility comments: Rolling to right with assist to bend bilat knees, assist for bringing BLE's off edge of bed and trunk to upright    Transfers                   General transfer comment: unable to minimally clear hips this session    Ambulation/Gait                   Stairs             Wheelchair Mobility    Modified Rankin (Stroke Patients Only) Modified Rankin (Stroke Patients Only) Pre-Morbid Rankin Score: Moderate disability Modified Rankin: Severe disability     Balance Overall balance assessment: Needs assistance Sitting-balance support: Feet supported, No upper extremity supported Sitting balance-Leahy Scale: Poor Sitting balance - Comments: with dynamic balance (i.e. self feeding), pt requiring minA due to posterior lean                                    Cognition Arousal/Alertness: Awake/alert Behavior During Therapy: Flat affect Overall Cognitive Status: No family/caregiver present to determine baseline cognitive functioning                                 General Comments: no verbalizations this session        Exercises General Exercises - Lower Extremity Ankle Circles/Pumps: PROM, Both, 10 reps, Supine Heel Slides: PROM, Both, 10 reps, Supine Hip ABduction/ADduction: PROM, Both, 10 reps, Supine Other Exercises Other Exercises: Dynamic sitting balance working on self  feeding with RUE    General Comments        Pertinent Vitals/Pain Pain Assessment Pain Assessment: Faces Faces Pain Scale: No hurt    Home Living                          Prior Function            PT Goals (current goals can now be found in the care plan section) Acute Rehab PT Goals Potential  to Achieve Goals: Fair    Frequency    Min 2X/week      PT Plan Current plan remains appropriate    Co-evaluation              AM-PAC PT "6 Clicks" Mobility   Outcome Measure  Help needed turning from your back to your side while in a flat bed without using bedrails?: Total Help needed moving from lying on your back to sitting on the side of a flat bed without using bedrails?: Total Help needed moving to and from a bed to a chair (including a wheelchair)?: Total Help needed standing up from a chair using your arms (e.g., wheelchair or bedside chair)?: Total Help needed to walk in hospital room?: Total Help needed climbing 3-5 steps with a railing? : Total 6 Click Score: 6    End of Session   Activity Tolerance: Patient limited by fatigue Patient left: in bed;with call bell/phone within reach;with bed alarm set Nurse Communication: Mobility status PT Visit Diagnosis: Other abnormalities of gait and mobility (R26.89);Muscle weakness (generalized) (M62.81);Hemiplegia and hemiparesis Hemiplegia - Right/Left: Left Hemiplegia - dominant/non-dominant: Non-dominant Hemiplegia - caused by: Cerebral infarction     Time: 5638-7564 PT Time Calculation (min) (ACUTE ONLY): 25 min  Charges:  $Therapeutic Activity: 23-37 mins                     Lillia Pauls, PT, DPT Acute Rehabilitation Services Office [phone]    [name] 10/16/2022, 3:01 PM

## 2022-10-16 NOTE — Progress Notes (Signed)
Occupational Therapy Treatment Patient Details Name: Jacob Bass MRN: [id number] DOB: 1961-02-10 Today's Date: 10/16/2022   History of present illness 62 y.o. male presents to [facility] hospital on 09/10/2022 with AMS and L weakness from [facility]. 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 Complicated by fluid overload. Paracentesis performed 1/14, 1/16, 1/21, 1/27. PMH includes CVA, COPD, cirrhosis, gastric ulcer.   OT comments  Patient received in supine and required assistance to position in bed to allow for self feeding. Patient placed in chair position and provided setup for self feeding. Patient required assistance to load utensil and bring to mouth due to difficulty holding onto utensil and was able to remove utensil from mouth. Patient reaching for cup but requires assistance to manage. Frequent cues to clear mouth.  Nursing tech instructed on assistance to provided patient to allow opportunities to assist with eating. Acute OT to continue to follow.     Recommendations for follow up therapy are one component of a multi-disciplinary discharge planning process, led by the attending physician.  Recommendations may be updated based on patient status, additional functional criteria and insurance authorization.    Follow Up Recommendations  Skilled nursing-short term rehab (<3 hours/day)     Assistance Recommended at Discharge Frequent or constant Supervision/Assistance  Patient can return home with the following  Two people to help with walking and/or transfers;Two people to help with bathing/dressing/bathroom   Equipment Recommendations  None recommended by OT    Recommendations for Other Services      Precautions / Restrictions Precautions Precautions: Fall;Other (comment) Precaution Comments: cortrak Restrictions Weight Bearing Restrictions: No       Mobility Bed Mobility Overal bed mobility: Needs Assistance Bed Mobility: Rolling Rolling: Max assist         General bed  mobility comments: rolling to right to assist with positioning in bed due to left lateral leaning    Transfers                   General transfer comment: not attempted     Balance                                           ADL either performed or assessed with clinical judgement   ADL Overall ADL's : Needs assistance/impaired Eating/Feeding: Moderate assistance;Maximal assistance;Bed level Eating/Feeding Details (indicate cue type and reason): patient placed in chair position in bed and setup for self feeding. Patient required assistance to load utensils and bring to mouth but was able to remove from mouth. Patient attempting to hold cup for drinking and required assistance to hold due to weakness. Grooming: Wash/dry face;Minimal assistance;Bed level Grooming Details (indicate cue type and reason): able to wash face following self feeding                               General ADL Comments: focused on self feeding and grooming    Extremity/Trunk Assessment              Vision       Perception     Praxis      Cognition Arousal/Alertness: Awake/alert Behavior During Therapy: Flat affect Overall Cognitive Status: No family/caregiver present to determine baseline cognitive functioning  General Comments: little to no verbalizations, patient would nod yes/no        Exercises      Shoulder Instructions       General Comments      Pertinent Vitals/ Pain       Pain Assessment Pain Assessment: Faces Faces Pain Scale: No hurt Pain Intervention(s): Monitored during session  Home Living                                          Prior Functioning/Environment              Frequency  Min 2X/week        Progress Toward Goals  OT Goals(current goals can now be found in the care plan section)  Progress towards OT goals: Progressing toward goals  Acute  Rehab OT Goals OT Goal Formulation: Patient unable to participate in goal setting Time For Goal Achievement: 10/23/22 Potential to Achieve Goals: Fair ADL Goals Pt Will Perform Eating: with min assist;bed level;sitting Pt Will Perform Grooming: with min assist;sitting Pt Will Perform Upper Body Bathing: with mod assist;sitting Pt/caregiver will Perform Home Exercise Program: Increased ROM;Left upper extremity;Increased strength;With Supervision Additional ADL Goal #1: Pt to demo bed mobility with Min A in prep for functional tasks EOB  Plan Discharge plan remains appropriate;Frequency remains appropriate    Co-evaluation                 AM-PAC OT "6 Clicks" Daily Activity     Outcome Measure   Help from another person eating meals?: A Lot Help from another person taking care of personal grooming?: A Lot Help from another person toileting, which includes using toliet, bedpan, or urinal?: Total Help from another person bathing (including washing, rinsing, drying)?: Total Help from another person to put on and taking off regular upper body clothing?: Total Help from another person to put on and taking off regular lower body clothing?: Total 6 Click Score: 8    End of Session    OT Visit Diagnosis: Unsteadiness on feet (R26.81);Other abnormalities of gait and mobility (R26.89);Muscle weakness (generalized) (M62.81)   Activity Tolerance Patient tolerated treatment well   Patient Left in bed;with call bell/phone within reach;with bed alarm set   Nurse Communication Other (comment) (% of meal consumed, assistance needed to allow patient to participate with eating)        Time: 2841-3244 OT Time Calculation (min): 23 min  Charges: OT General Charges $OT Visit: 1 Visit OT Treatments $Self Care/Home Management : 23-37 mins  Alfonse Flavors, OTA Acute Rehabilitation Services  Office [phone]   Dewain Penning 10/16/2022, 11:42 AM

## 2022-10-16 NOTE — Consult Note (Signed)
[place] Nurse wound follow up Wound type: Deep Tissue Pressure Injury left heel  Measurement: 1cm x 1cm x 0cm  Wound bed: patch, dark purple tissue, non blanchable  Drainage (amount, consistency, odor) none Periwound: intact  Dressing procedure/placement/frequency: Prevalon boots ordered; WOC put them on patient at my weekly visit last week Today again Prevalon boots are not on patient; suggested nursing put Prevalon boots on to offload HAPI left heel.   [place] Nurse team will follow with you and see patient within 10 days for wound assessments.  Please notify [place] nurses of any acute changes in the wounds or any new areas of concern [place] MSN, [place], [place], [place]

## 2022-10-16 NOTE — Progress Notes (Addendum)
[facility] PROGRESS NOTE   Jacob Bass ZOX:[id number] DOB: 11-Apr-1961 DOA: 09/10/2022  PCP: Donia Ast., MD  Brief History/Interval Summary:  62 y.o. male with PMH significant for history of stroke with residual left-sided deficits, HTN, liver cirrhosis, chronic anemia, COPD, recent diagnosis of COVID who lives at [facility]. On 1/8, patient was sent to the ED for altered mental status and weakness.  Last known normal the previous night. MRI brain positive for small acute infarct. Because of stroke, patient has dysphagia, expressive aphasia and worsening of left-sided deficits.   His hospital course has been complicated by recurrent ascites and dysphagia.  He has required multiple paracentesis.  Started on nasogastric tube feedings. Not candidate for PEG tube placement due to recurrent ascites. Feedings had to be held for 2 days due to recurrent aspirations and tube clogging.  Now on nocturnal feeds. Palliative care consulted.  Legal guardianship is in process.  Poor prognosis. Patient remains full code.  Patient disposition continues to be complicated due to his need for DSS/Guardianship - meeting 10/11/22 preliminary hearing completed. Next meeting 10/24/22 to hopefully decide guardianship and further medical care planning. If patient's p.o. intake can improve an NG tube can be removed it would be reasonable to discharge him back to facility, he can await results of the future meeting on guardianship at facility, he does not need to be hospitalized for this decision. Once guardianship has been confirmed may need to discuss long term goals of care given he cannot have PEG tube (secondary to ascites).  Consultants: Palliative care.  Subjective/Interval History: No acute issues/events overnight - ROS limited - patient responds with one syllable responses/grunts - rarely intelligible (yes/no mostly).  Assessment/Plan:  Liver cirrhosis with anasarca/ascites/history of hepatitis C and  alcohol abuse Per history, patient had generalized anasarca and worsening pedal edema since October. Presented with significant ascites and has required multiple paracentesis so far during this admission. Continue lasix, spironolactone Last paracentesis on 2/8 with drainage of 3L of clear yellow liquid. LFTs have been stable. Furosemide ongoing Ultrasound showed cirrhotic appearance of liver. Patient with previous history of alcohol abuse and history of hepatitis C.  Please note that patient has another medical record MRN [id number].   Acute GI bleeding in the setting of liver cirrhosis, resolved Acute blood loss anemia, stable On 1/24, patient had an episode of hematochezia, hematemesis.  Tube feeding was stopped.  GI consultation was obtained.  Patient was started on Protonix. GI bleeding seems to have stabilized.  Hemoglobin roughly stable.  No EGD at this time per GI.  Tube feedings were reinitiated. Hemoglobin is stable.  No evidence for overt bleeding.   Acute CVA/Prior CVA with left-sided deficits Primarily brought to the ED after patient was noted to be less interactive compared to her baseline. MRI showed left acute CVA. Stroke workup completed.  Echo showed EF of 55 to 60% with no cardiac source of embolism A1c 4.5, LDL 74 Because of active bleeding, patient is currently not on any antithrombotic or anticoagulant. Statin not started because of underlying liver cirrhosis Current deficit include expressive aphasia and dysphagia.  Still has left-sided weakness.   Oropharyngeal dysphagia Secondary to stroke  Holding nocturnal feeds given increased PO intake. Dysphagia 1 (puree);Honey-thick liquid ongoing Not a candidate for PEG tube due to recurrent ascites PO intake labile but improving, discontinue cortrack/NG today(2/13) and follow calorie count and fluid intake over the next 24 to 48 hours with dietary.   If stable will discharge back to  facility, if worsening labs/clinical  status will have to discuss long term goals of care/nutrition with guardian once that is confirmed.  Hypoglycemia, chronic Continues to have hypoglycemia in the morning despite increased PO intake(and previously despite overnight feeds) Patient is not on any insulin or diabetic medications that could cause his recurrent hypoglycemia.  Hypernatremia, resolved Sodium level peaked at 158 - WNL currently Continue free water flushes per dietary, currently well-controlled  Aspiration pneumonitis versus pneumonia On 1/20, patient vomited. Tube feeding was stopped.   Completed ceftriaxone - Respiratory status is stable.   Acute hepatic encephalopathy, improved but not back to previous baseline Ammonia level was initially 99, peak at 133, improved with lactulose Mental status is likely at new baseline although appears to be somewhat depressed from prior, guardianship hearing as above   Acute kidney injury/hypokalemia, resolving Baseline creatinine around 1.   Presented with creatinine of 2.08, peaked at 2.12.  Creatinine likely at new baseline around 1.9 Continue torsemide   COPD Without acute exacerbation  Continue bronchodilators   BPH Flomax   Impaired mobility PT evaluation obtained.  Return to SNF recommended   Goals of care Palliative care is following.  DSS is involved but no legal guardian yet. [facility] for guardianship [name], [phone]). On 1/29, palliative care reached out to DSS again.  They were told that petition was filed on 1/29 for guardianship.   Ethics was involved.  Remains full code as of now.  Moderate calorie malnutrition Nutrition Problem: Moderate Malnutrition Etiology: chronic illness  DVT Prophylaxis: SCDs Code Status: Full code Family Communication: None available, no legal guardian Disposition Plan: SNF when medically stable -likely in the next 24 to 48 hours pending p.o. intake.  Status is: Inpatient Remains inpatient  appropriate because: Oropharyngeal dysphagia requiring NG tube feedings  Medications: Scheduled:  feeding supplement (JEVITY 1.5 CAL/FIBER)  1,000 mL Per Tube Q24H   feeding supplement (PROSource TF20)  60 mL Per Tube Daily   fiber supplement (BANATROL TF)  60 mL Per Tube BID   free water  300 mL Per Tube Q4H   furosemide  40 mg Intravenous Daily   lactulose  10 g Per Tube BID   latanoprost  1 drop Both Eyes QHS   lidocaine (PF)  15 mL Intradermal Once   lidocaine (PF)  5 mL Intradermal Once   metoprolol tartrate  25 mg Per Tube BID   pantoprazole (PROTONIX) IV  40 mg Intravenous Q24H   tamsulosin  0.4 mg Oral Daily   Continuous:   PRN:[DISCONTINUED] acetaminophen **OR** acetaminophen (TYLENOL) oral liquid 160 mg/5 mL **OR** acetaminophen, dextrose, Gerhardt's butt cream, ipratropium-albuterol, ondansetron (ZOFRAN) IV, senna-docusate  Antibiotics: Anti-infectives (From admission, onward)    Start     Dose/Rate Route Frequency Ordered Stop   10/02/22 1000  cefTRIAXone (ROCEPHIN) 1 g in sodium chloride 0.9 % 100 mL IVPB        1 g 200 mL/hr over 30 Minutes Intravenous Every 24 hours 10/02/22 0855 10/08/22 1311   09/10/22 1345  cefTRIAXone (ROCEPHIN) 1 g in sodium chloride 0.9 % 100 mL IVPB        1 g 200 mL/hr over 30 Minutes Intravenous  Once 09/10/22 1331 09/10/22 1605   09/10/22 1345  azithromycin (ZITHROMAX) 500 mg in sodium chloride 0.9 % 250 mL IVPB        500 mg 250 mL/hr over 60 Minutes Intravenous  Once 09/10/22 1331 09/10/22 1605       Objective:  Vital Signs  Vitals:   10/15/22 1537 10/15/22 2028 10/15/22 2334 10/16/22 0348  BP: 111/87 (!) 146/90 (!) 146/80 90/68  Pulse: 76 86 94 89  Resp: 20 18 18 18   Temp: 97.9 F (36.6 C) 98 F (36.7 C) 98.6 F (37 C) 98.3 F (36.8 C)  TempSrc: Oral     SpO2: 100% 100% 100% 99%  Weight:      Height:        Intake/Output Summary (Last 24 hours) at 10/16/2022 0728 Last data filed at 10/16/2022 0640 Gross per 24 hour   Intake 3320 ml  Output 603 ml  Net 2717 ml    Filed Weights   10/12/22 0441 10/13/22 0445 10/14/22 0500  Weight: 64.1 kg 64.7 kg 65.2 kg    General appearance: Awake alert.  In no distress, NG tube present. Resp: Clear to auscultation bilaterally.  Normal effort Cardio: S1-S2 is normal regular.  No S3-S4.  No rubs murmurs or bruit GI: Abdomen is distended with ascites.  Nontender.  Bowel sounds present.  Extremities: No edema.   Left Hemiparesis.   Lab Results:  Data Reviewed: I have personally reviewed following labs and reports of the imaging studies  CBC: Recent Labs  Lab 10/11/22 0440 10/13/22 0539 10/15/22 0653  WBC 8.7 7.6 6.8  HGB 9.6* 8.3* 9.0*  HCT 29.3* 24.1* 26.1*  MCV 106.5* 103.0* 102.0*  PLT 91* 83* 80*     Basic Metabolic Panel: Recent Labs  Lab 10/10/22 0552 10/11/22 0440 10/13/22 0539 10/15/22 0653  NA 137 137 134* 140  K 3.8 4.1 3.9 3.5  CL 110 111 109 112*  CO2 18* 17* 18* 18*  GLUCOSE 114* 104* 99 76  BUN 64* 66* 68* 76*  CREATININE 1.84* 1.76* 1.67* 1.67*  CALCIUM 7.5* 7.6* 7.4* 8.0*     GFR: Estimated Creatinine Clearance: 41.8 mL/min (A) (by C-G formula based on SCr of 1.67 mg/dL (H)).  Liver Function Tests: No results for input(s): "AST", "ALT", "ALKPHOS", "BILITOT", "PROT", "ALBUMIN" in the last 168 hours.   No results for input(s): "AMMONIA" in the last 168 hours.   Coagulation Profile: No results for input(s): "INR", "PROTIME" in the last 168 hours.    CBG: Recent Labs  Lab 10/15/22 0805 10/15/22 1202 10/15/22 2033 10/15/22 2331 10/16/22 0347  GLUCAP 65* 90 105* 102* 111*      Radiology Studies: No results found.     LOS: 35 days   [name] DO  [facility] Pager on [email or web address]  10/16/2022, 7:28 AM

## 2022-10-16 NOTE — Progress Notes (Signed)
Nutrition Follow-up  DOCUMENTATION CODES:  Non-severe (moderate) malnutrition in context of chronic illness  INTERVENTION:  Continue current diet as ordered, nursing staff to assist with feeding and tray set up Encourage PO fluids Magic cup TID with meals, each supplement provides 290 kcal and 9 grams of protein Monitor guardianship status Calorie Count x 48 hours  NUTRITION DIAGNOSIS:  Moderate Malnutrition related to chronic illness as evidenced by moderate fat depletion, severe muscle depletion. - remains applicable  GOAL:  Patient will meet greater than or equal to 90% of their needs - being met with TF at goal  MONITOR:  Diet advancement, Labs, I & O's, TF tolerance  REASON FOR ASSESSMENT:  Consult Assessment of nutrition requirement/status  ASSESSMENT:  Pt with hx of HTN, COPD, cirrhosis, hx of etOH abuse, dementia, and prior CVA presented to ED from his nursing facility with AMS and weakness. Found to have suffered an acute infarct.   1/9 - MBS, NPO 1/12 - cortrak placed 1/14 - paracentesis, 3.8L of clear yellow fluid removed 1/17 - MBS, NPO, SLP signed off on acute treatment, noted that oral phase of swallow is not functional, paracentesis, 5.8L removed 1/21 - paracentesis, 3.5L removed 1/23 - paracentesis, 3.4L removed  1/27 - paracentesis, 4.2L removed 1/30 - MBS, DYS 1 with honey think liquids 2/13 - cortrak removed  Pt resting in bed at the time of assessment. Opens eyes when spoken to, but not very interactive today. NT reports he ate about 25% of his breakfast this AM.   MD requested input about removing cortrak tube and pt's ability to meet needs PO. States it was reported to him that pt had good PO several days recently.   Discussed with SLP and MD and decided to remove cortrak on trial basis and push PO fluids over the next few days to determine if labs remain stable. Pt will be unable to dc with cortrak but is currently receiving 1.8L of free water each  day via tube. Will initiate kcal count to determine if pt's needs are being met without the TF. Pt has become hypernatremic without free water flushes in the past. May require replacement of tube.  Average Meal Intake: 1/30-2/6: 54% intake x 11 recorded meals Per kcal count 2/2-2/5, pt meeting ~41% of kcal needs and 36% of protein needs 2/6-2/13: 61% x 16 recorded meals  Nutritionally Relevant Medications: Scheduled Meds:  famotidine  20 mg Oral BID   furosemide  40 mg Oral BID   lactulose  10 g Per Tube BID   pantoprazole IV  40 mg Intravenous Q24H   spironolactone  25 mg Oral Daily   PRN Meds: ondansetron, senna-docusate  Labs Reviewed 2/12: Chloride 112 BUN 76, creatinine 1.67 CBG ranges from 65-111 mg/dL over the last 24 hours  NUTRITION - FOCUSED PHYSICAL EXAM: Flowsheet Row Most Recent Value  Orbital Region Mild depletion  Upper Arm Region Moderate depletion  Thoracic and Lumbar Region Moderate depletion  Buccal Region Mild depletion  Temple Region Mild depletion  Clavicle Bone Region Mild depletion  Clavicle and Acromion Bone Region Severe depletion  Scapular Bone Region Moderate depletion  Dorsal Hand Severe depletion  Patellar Region No depletion  Anterior Thigh Region No depletion  Posterior Calf Region No depletion  Edema (RD Assessment) Moderate  [significant edema to the BLE]  Hair Reviewed  Eyes Reviewed  Mouth Reviewed  Skin Reviewed  Nails Reviewed    Diet Order:   Diet Order  DIET - DYS 1 Room service appropriate? No; Fluid consistency: Honey Thick  Diet effective now                   EDUCATION NEEDS:  Not appropriate for education at this time  Skin:  Skin Assessment: Reviewed RN Assessment  Last BM:  2/13  Height:  Ht Readings from Last 1 Encounters:  09/10/22 5' 7"$  (1.702 m)    Weight:  Wt Readings from Last 1 Encounters:  10/14/22 65.2 kg    Ideal Body Weight:  67.3 kg  BMI:  Body mass index is 22.51  kg/m.  Estimated Nutritional Needs:  Kcal:  1900-2100 kcal/d Protein:  90-105g/d Fluid:  2L/d    Ranell Patrick, RD, LDN Clinical Dietitian RD pager # available in [place]  After hours/weekend pager # available in [facility]

## 2022-10-17 DIAGNOSIS — I639 Cerebral infarction, unspecified: Secondary | ICD-10-CM | POA: Diagnosis not present

## 2022-10-17 DIAGNOSIS — R1312 Dysphagia, oropharyngeal phase: Secondary | ICD-10-CM | POA: Diagnosis not present

## 2022-10-17 LAB — BASIC METABOLIC PANEL
Anion gap: 9 (ref 5–15)
BUN: 67 mg/dL — ABNORMAL HIGH (ref 8–23)
CO2: 22 mmol/L (ref 22–32)
Calcium: 8 mg/dL — ABNORMAL LOW (ref 8.9–10.3)
Chloride: 112 mmol/L — ABNORMAL HIGH (ref 98–111)
Creatinine, Ser: 1.66 mg/dL — ABNORMAL HIGH (ref 0.61–1.24)
GFR, Estimated: 46 mL/min — ABNORMAL LOW (ref >60)
Glucose, Bld: 103 mg/dL — ABNORMAL HIGH (ref 70–99)
Potassium: 3.4 mmol/L — ABNORMAL LOW (ref 3.5–5.1)
Sodium: 143 mmol/L (ref 135–145)

## 2022-10-17 LAB — GLUCOSE, CAPILLARY
Glucose-Capillary: 87 mg/dL (ref 70–99)
Glucose-Capillary: 69 mg/dL — ABNORMAL LOW (ref 70–99)
Glucose-Capillary: 80 mg/dL (ref 70–99)
Glucose-Capillary: 115 mg/dL — ABNORMAL HIGH (ref 70–99)
Glucose-Capillary: 100 mg/dL — ABNORMAL HIGH (ref 70–99)
Glucose-Capillary: 75 mg/dL (ref 70–99)

## 2022-10-17 LAB — CBC
HCT: 29.8 % — ABNORMAL LOW (ref 39.0–52.0)
Hemoglobin: 9.6 g/dL — ABNORMAL LOW (ref 13.0–17.0)
MCH: 34.2 pg — ABNORMAL HIGH (ref 26.0–34.0)
MCHC: 32.2 g/dL (ref 30.0–36.0)
MCV: 106 fL — ABNORMAL HIGH (ref 80.0–100.0)
Platelets: 83 10*3/uL — ABNORMAL LOW (ref 150–400)
RBC: 2.81 MIL/uL — ABNORMAL LOW (ref 4.22–5.81)
RDW: 18.9 % — ABNORMAL HIGH (ref 11.5–15.5)
WBC: 7.3 10*3/uL (ref 4.0–10.5)
nRBC: 0 % (ref 0.0–0.2)

## 2022-10-17 MED ORDER — METOPROLOL TARTRATE 25 MG PO TABS
25.0000 mg | ORAL_TABLET | Freq: Two times a day (BID) | ORAL | Status: DC
  Administered 2022-10-17 – 2022-10-26 (×18): 25 mg via ORAL
  Filled 2022-10-17 (×18): qty 1

## 2022-10-17 MED ORDER — POTASSIUM CHLORIDE CRYS ER 20 MEQ PO TBCR
40.0000 meq | EXTENDED_RELEASE_TABLET | Freq: Once | ORAL | Status: AC
  Administered 2022-10-17: 40 meq via ORAL
  Filled 2022-10-17: qty 2

## 2022-10-17 MED ORDER — LACTULOSE 10 GM/15ML PO SOLN
10.0000 g | Freq: Two times a day (BID) | ORAL | Status: DC
  Administered 2022-10-17 – 2022-10-26 (×18): 10 g via ORAL
  Filled 2022-10-17 (×18): qty 30

## 2022-10-17 MED ORDER — SENNOSIDES-DOCUSATE SODIUM 8.6-50 MG PO TABS: 1.0000 | ORAL_TABLET | Freq: Every evening | ORAL | Status: DC | PRN

## 2022-10-17 MED ORDER — ACETAMINOPHEN 160 MG/5ML PO SOLN: 650.0000 mg | ORAL | Status: DC | PRN

## 2022-10-17 MED ORDER — ACETAMINOPHEN 650 MG RE SUPP: 650.0000 mg | RECTAL | Status: DC | PRN

## 2022-10-17 NOTE — Progress Notes (Signed)
Calorie Count Note  48 hour calorie count ordered.  Diet: DYS 1, honey thick liquids  Pt with good PO intake over the last 24 hours. Meeting ~75% of nutrition needs but lacking in fluid. Added 2 additional drinks to each tray to encourage additional fluids. Will continue to monitor x 24 hours. Na WNL on AM labs.  Estimated Nutritional Needs:  Kcal:  1900-2100 kcal/d Protein:  90-105g/d Fluid:  2L/d  2/13, Dinner: 316 kcal, 13g protein, 156m fluid 2/14, Breakfast: 307 kcal, 14g protein, 2315mfluid 2/14, Lunch: 356kcal, 26g of protein, 11871mluid Supplements: pudding x 4 (520kcal and 12g protein)  Total intake: 1499 kcal (79% of minimum estimated needs)  65g protein (73% of minimum estimated needs) 472m41m fluid (24% estimated needs)  INTERVENTION:  Continue current diet as ordered, nursing staff to assist with feeding and tray set up Encourage PO fluids, added additional fluid to meal tray Magic cup TID with meals, each supplement provides 290 kcal and 9 grams of protein Monitor guardianship status Continue calorie count   NUTRITION DIAGNOSIS:  Moderate Malnutrition related to chronic illness as evidenced by moderate fat depletion, severe muscle depletion. - remains applicable   GOAL:  Patient will meet greater than or equal to 90% of their needs - diet and supplement in place   RachRanell Patrick, LDN Clinical Dietitian RD pager # available in [facility]  After hours/weekend pager # available in [facility]

## 2022-10-17 NOTE — Progress Notes (Signed)
[facility] PROGRESS NOTE   Jacob Bass DOB: 1961/01/28 DOA: 09/10/2022  PCP: Donia Ast., MD  Brief History/Interval Summary:  62 y.o. male with PMH significant for history of stroke with residual left-sided deficits, HTN, liver cirrhosis, chronic anemia, COPD, recent diagnosis of COVID who lives at [facility]. On 1/8, patient was sent to the ED for altered mental status and weakness.  Last known normal the previous night. MRI brain positive for small acute infarct. Because of stroke, patient has dysphagia, expressive aphasia and worsening of left-sided deficits.   His hospital course has been complicated by recurrent ascites and dysphagia.  He has required multiple paracentesis.  Started on nasogastric tube feedings. Not candidate for PEG tube placement due to recurrent ascites. Feedings had to be held for 2 days due to recurrent aspirations and tube clogging.  Now on nocturnal feeds. Palliative care consulted.  Legal guardianship is in process.  Poor prognosis. Patient remains full code.  Patient disposition continues to be complicated due to his need for DSS/Guardianship - meeting 10/11/22 preliminary hearing completed. Next meeting 10/24/22 to hopefully decide guardianship and further medical care planning. Once guardianship has been confirmed may need to discuss long term goals of care given he cannot have PEG tube (secondary to ascites).  Consultants: Palliative care.  Subjective/Interval History: Patient not very communicative due to his stroke.  Does not appear to be in any discomfort.    Assessment/Plan:  Liver cirrhosis with anasarca/ascites/history of hepatitis C and alcohol abuse Per history, patient had generalized anasarca and worsening pedal edema since October. Presented with significant ascites and has required multiple paracentesis so far during this admission. Continue lasix, spironolactone Last paracentesis on 2/8 with drainage of 3L of clear  yellow liquid. LFTs have been stable. Ultrasound showed cirrhotic appearance of liver. Patient with previous history of alcohol abuse and history of hepatitis C.  Please note that patient has another medical record MRN [id number]. Stable for the most part.  May need another paracentesis in a few days.  Oropharyngeal dysphagia Secondary to stroke. Was on NG tube feedings.  Seen by speech therapy.  Now on dysphagia 1 diet.  After discussions with speech therapy and dietitian NG tube was removed yesterday.  Will see how patient does over the next 2 to 3 days.  Will also need to monitor his labs since he did develop hypernatremia last week. If stable will discharge back to facility, if worsening labs/clinical status will have to discuss long term goals of care/nutrition with guardian once that is confirmed.  Acute CVA/Prior CVA with left-sided deficits Primarily brought to the ED after patient was noted to be less interactive compared to her baseline. MRI showed left acute CVA. Stroke workup completed.  Echo showed EF of 55 to 60% with no cardiac source of embolism A1c 4.5, LDL 74 Because of active bleeding, patient is currently not on any antithrombotic or anticoagulant. Statin not started because of underlying liver cirrhosis Current deficit include expressive aphasia and dysphagia.  Continues to have left-sided weakness though improved mobility noted in the lower extremities.   Hypoglycemia, chronic Continues to have hypoglycemia in the morning despite increased PO intake(and previously despite overnight feeds) Patient is not on any insulin or diabetic medications that could cause his recurrent hypoglycemia. Recommend late-night snack.  Acute GI bleeding in the setting of liver cirrhosis, resolved Acute blood loss anemia, stable On 1/24, patient had an episode of hematochezia, hematemesis.  Tube feeding was stopped.  GI consultation was  obtained.  Patient was started on Protonix. GI bleeding  seems to have stabilized.  Hemoglobin roughly stable.  No EGD at this time per GI.  Tube feedings were reinitiated. Hemoglobin is stable.  No evidence for overt bleeding.   Hypernatremia, resolved Sodium level peaked at 158.  Improved after.  He was given D5 infusion along with free water down his NG tube. NG tube has been removed.  Will need to see if his sodium levels increase over the next day or 2.  Aspiration pneumonitis versus pneumonia On 1/20, patient vomited. Tube feeding was stopped.   Completed ceftriaxone - Respiratory status is stable.   Acute hepatic encephalopathy, improved but not back to previous baseline Ammonia level was initially 99, peak at 133, improved with lactulose Mentation is stable.   Acute kidney injury/hypokalemia, resolving Baseline creatinine around 1.   Presented with creatinine of 2.08, peaked at 2.12.  Likely has a new baseline now around 1.6.  Monitor urine output.  Avoid nephrotoxic agent. Replace potassium.  Thrombocytopenia Secondary to liver cirrhosis.  Counts are stable.   COPD Without acute exacerbation  Continue bronchodilators   BPH Flomax   Impaired mobility PT evaluation obtained.  Return to SNF recommended   Goals of care Palliative care is following.  DSS is involved but no legal guardian yet. [facility] for guardianship [name], [phone]). On 1/29, palliative care reached out to DSS again.  They were told that petition was filed on 1/29 for guardianship.   Ethics was involved.  Remains full code as of now.  Moderate calorie malnutrition Nutrition Problem: Moderate Malnutrition Etiology: chronic illness  DVT Prophylaxis: SCDs Code Status: Full code Family Communication: None available, no legal guardian Disposition Plan: SNF when medically stable.  Status is: Inpatient Remains inpatient appropriate because: Oropharyngeal dysphagia requiring NG tube feedings  Medications: Scheduled:  famotidine   20 mg Oral BID   furosemide  40 mg Oral BID   lactulose  10 g Per Tube BID   latanoprost  1 drop Both Eyes QHS   lidocaine (PF)  15 mL Intradermal Once   lidocaine (PF)  5 mL Intradermal Once   metoprolol tartrate  25 mg Per Tube BID   pantoprazole (PROTONIX) IV  40 mg Intravenous Q24H   spironolactone  25 mg Oral Daily   tamsulosin  0.4 mg Oral Daily   Continuous:   PRN:[DISCONTINUED] acetaminophen **OR** acetaminophen (TYLENOL) oral liquid 160 mg/5 mL **OR** acetaminophen, dextrose, Gerhardt's butt cream, ipratropium-albuterol, ondansetron (ZOFRAN) IV, senna-docusate  Antibiotics: Anti-infectives (From admission, onward)    Start     Dose/Rate Route Frequency Ordered Stop   10/02/22 1000  cefTRIAXone (ROCEPHIN) 1 g in sodium chloride 0.9 % 100 mL IVPB        1 g 200 mL/hr over 30 Minutes Intravenous Every 24 hours 10/02/22 0855 10/08/22 1311   09/10/22 1345  cefTRIAXone (ROCEPHIN) 1 g in sodium chloride 0.9 % 100 mL IVPB        1 g 200 mL/hr over 30 Minutes Intravenous  Once 09/10/22 1331 09/10/22 1605   09/10/22 1345  azithromycin (ZITHROMAX) 500 mg in sodium chloride 0.9 % 250 mL IVPB        500 mg 250 mL/hr over 60 Minutes Intravenous  Once 09/10/22 1331 09/10/22 1605       Objective:  Vital Signs  Vitals:   10/16/22 1659 10/16/22 2030 10/16/22 2329 10/17/22 0826  BP: 130/83 139/83 134/82 (!) 144/72  Pulse: (!) 104 73 72  84  Resp: 16 16 16 16   Temp: 97.6 F (36.4 C) 98.2 F (36.8 C) 98.3 F (36.8 C) 97.7 F (36.5 C)  TempSrc: Oral Axillary  Oral  SpO2: 100% 98% (!) 84% 96%  Weight:      Height:        Intake/Output Summary (Last 24 hours) at 10/17/2022 1039 Last data filed at 10/17/2022 0160 Gross per 24 hour  Intake 430 ml  Output 1000 ml  Net -570 ml    Filed Weights   10/12/22 0441 10/13/22 0445 10/14/22 0500  Weight: 64.1 kg 64.7 kg 65.2 kg    General appearance: Awake alert.  In no distress Resp: Clear to auscultation bilaterally.  Normal  effort Cardio: S1-S2 is normal regular.  No S3-S4.  No rubs murmurs or bruit GI: Abdomen is soft.  Nontender nondistended.  Bowel sounds are present normal.  No masses organomegaly Extremities: No edema.  Neurologic: Left hemiparesis  Lab Results:  Data Reviewed: I have personally reviewed following labs and reports of the imaging studies  CBC: Recent Labs  Lab 10/11/22 0440 10/13/22 0539 10/15/22 0653 10/17/22 0314  WBC 8.7 7.6 6.8 7.3  HGB 9.6* 8.3* 9.0* 9.6*  HCT 29.3* 24.1* 26.1* 29.8*  MCV 106.5* 103.0* 102.0* 106.0*  PLT 91* 83* 80* 83*     Basic Metabolic Panel: Recent Labs  Lab 10/11/22 0440 10/13/22 0539 10/15/22 0653 10/17/22 0314  NA 137 134* 140 143  K 4.1 3.9 3.5 3.4*  CL 111 109 112* 112*  CO2 17* 18* 18* 22  GLUCOSE 104* 99 76 103*  BUN 66* 68* 76* 67*  CREATININE 1.76* 1.67* 1.67* 1.66*  CALCIUM 7.6* 7.4* 8.0* 8.0*     GFR: Estimated Creatinine Clearance: 42 mL/min (A) (by C-G formula based on SCr of 1.66 mg/dL (H)).   CBG: Recent Labs  Lab 10/16/22 1657 10/16/22 2330 10/17/22 0415 10/17/22 0817 10/17/22 0837  GLUCAP 89 70 87 69* 80      Radiology Studies: No results found.     LOS: 36 days   Jacob Bass on [email or web address]  10/17/2022, 10:39 AM

## 2022-10-17 NOTE — Progress Notes (Addendum)
CBG 69 at 0817 this AM. Breakfast tray had arrived at same time, will let pt eat and recheck blood sugar within an hour.  CBG recheck at 0837: 80.

## 2022-10-18 DIAGNOSIS — I639 Cerebral infarction, unspecified: Secondary | ICD-10-CM | POA: Diagnosis not present

## 2022-10-18 DIAGNOSIS — E870 Hyperosmolality and hypernatremia: Secondary | ICD-10-CM | POA: Diagnosis not present

## 2022-10-18 DIAGNOSIS — R1312 Dysphagia, oropharyngeal phase: Secondary | ICD-10-CM | POA: Diagnosis not present

## 2022-10-18 LAB — GLUCOSE, CAPILLARY
Glucose-Capillary: 95 mg/dL (ref 70–99)
Glucose-Capillary: 94 mg/dL (ref 70–99)
Glucose-Capillary: 101 mg/dL — ABNORMAL HIGH (ref 70–99)
Glucose-Capillary: 69 mg/dL — ABNORMAL LOW (ref 70–99)
Glucose-Capillary: 102 mg/dL — ABNORMAL HIGH (ref 70–99)
Glucose-Capillary: 148 mg/dL — ABNORMAL HIGH (ref 70–99)
Glucose-Capillary: 98 mg/dL (ref 70–99)

## 2022-10-18 LAB — BASIC METABOLIC PANEL
Anion gap: 13 (ref 5–15)
BUN: 62 mg/dL — ABNORMAL HIGH (ref 8–23)
CO2: 17 mmol/L — ABNORMAL LOW (ref 22–32)
Calcium: 8 mg/dL — ABNORMAL LOW (ref 8.9–10.3)
Chloride: 119 mmol/L — ABNORMAL HIGH (ref 98–111)
Creatinine, Ser: 1.7 mg/dL — ABNORMAL HIGH (ref 0.61–1.24)
GFR, Estimated: 45 mL/min — ABNORMAL LOW (ref >60)
Glucose, Bld: 83 mg/dL (ref 70–99)
Potassium: 4.3 mmol/L (ref 3.5–5.1)
Sodium: 149 mmol/L — ABNORMAL HIGH (ref 135–145)

## 2022-10-18 LAB — CBC
HCT: 29.1 % — ABNORMAL LOW (ref 39.0–52.0)
Hemoglobin: 9.5 g/dL — ABNORMAL LOW (ref 13.0–17.0)
MCH: 34.3 pg — ABNORMAL HIGH (ref 26.0–34.0)
MCHC: 32.6 g/dL (ref 30.0–36.0)
MCV: 105.1 fL — ABNORMAL HIGH (ref 80.0–100.0)
Platelets: 85 10*3/uL — ABNORMAL LOW (ref 150–400)
RBC: 2.77 MIL/uL — ABNORMAL LOW (ref 4.22–5.81)
RDW: 19.2 % — ABNORMAL HIGH (ref 11.5–15.5)
WBC: 7.9 10*3/uL (ref 4.0–10.5)
nRBC: 0 % (ref 0.0–0.2)

## 2022-10-18 LAB — HEMOCCULT GUIAC POC 1CARD (OFFICE): Fecal Occult Blood, POC: POSITIVE — AB

## 2022-10-18 LAB — CORTISOL: Cortisol, Plasma: 12.7 ug/dL

## 2022-10-18 MED ORDER — FUROSEMIDE 40 MG PO TABS
40.0000 mg | ORAL_TABLET | Freq: Every day | ORAL | Status: DC
  Administered 2022-10-19 – 2022-10-26 (×8): 40 mg via ORAL
  Filled 2022-10-18 (×9): qty 1

## 2022-10-18 NOTE — Progress Notes (Addendum)
CBG 69 this AM at 0748. 4oz OJ given. Will recheck CBG within the hour.   Recheck @ [id number]

## 2022-10-18 NOTE — Progress Notes (Addendum)
Pt had 2 medium amount raspberry colored bowel movements this AM (night shift and day shift) per rectum. Stool occult positive. Dr. [place] Pink aware. VS stable. CBC ordered.

## 2022-10-18 NOTE — Progress Notes (Signed)
Calorie Count Note  48-hour calorie count ordered.  Diet: DYS 1, honey thick liquids  Only two tickets available to calculate, but pt continues to have consistent intake of meals. Sodium levels did increase again today. Discussed with MD the concern over pt's inadequate fluid intake and recurrent hypoglycemia episodes. Recommended either initiation of dextrose containing IVF or replacing cortrak tube to provide free water flushes and nocturnal feeds. MD trending one more day of labs and stated if Na remains high he will likely start IVF tomorrow to monitor through the weekend. Discussed with RN and encouraged PO fluid.  Estimated Nutritional Needs:  Kcal:  1900-2100 kcal/d Protein:  90-105g/d Fluid:  2L/d  2/14, Dinner: 437kcal, 24g protein, 162m fluid 2/15, Breakfast: 623 kcal, 30g protein, 650 mL fluid 2/15, no ticket available  Total intake: 1060 kcal (56% of minimum estimated needs)  54g protein (60% of minimum estimated needs) 7629mof fluid (38% estimated needs)  INTERVENTION:  Continue current diet as ordered, nursing staff to assist with feeding and tray set up Encourage PO fluids, added additional fluid to meal tray Magic cup TID with meals, each supplement provides 290 kcal and 9 grams of protein Monitor guardianship status Continue calorie count   NUTRITION DIAGNOSIS:  Moderate Malnutrition related to chronic illness as evidenced by moderate fat depletion, severe muscle depletion. - remains applicable   GOAL:  Patient will meet greater than or equal to 90% of their needs - diet and supplement in place   RaRanell PatrickRD, LDN Clinical Dietitian RD pager # available in AMION  After hours/weekend pager # available in [facility]

## 2022-10-18 NOTE — Progress Notes (Addendum)
[facility] PROGRESS NOTE   Jacob Bass RKY:[id number] DOB: February 03, 1961 DOA: 09/10/2022  PCP: Donia Ast., MD  Brief History/Interval Summary:  62 y.o. male with PMH significant for history of stroke with residual left-sided deficits, HTN, liver cirrhosis, chronic anemia, COPD, recent diagnosis of COVID who lives at [facility]. On 1/8, patient was sent to the ED for altered mental status and weakness.  Last known normal the previous night. MRI brain positive for small acute infarct. Because of stroke, patient has dysphagia, expressive aphasia and worsening of left-sided deficits.   His hospital course has been complicated by recurrent ascites and dysphagia.  He has required multiple paracentesis.  Started on nasogastric tube feedings. Not candidate for PEG tube placement due to recurrent ascites. Feedings had to be held for 2 days due to recurrent aspirations and tube clogging.  Now on nocturnal feeds. Palliative care consulted.  Legal guardianship is in process.  Poor prognosis. Patient remains full code.  Patient disposition continues to be complicated due to his need for DSS/Guardianship - meeting 10/11/22 preliminary hearing completed. Next meeting 10/24/22 to hopefully decide guardianship and further medical care planning. Once guardianship has been confirmed may need to discuss long term goals of care given he cannot have PEG tube (secondary to ascites).  Consultants: Palliative care.  Subjective/Interval History: Patient has aphasia due to stroke.  Unable to communicate effectively.  Does not appear to be in any discomfort.    Assessment/Plan:  Liver cirrhosis with anasarca/ascites/history of hepatitis C and alcohol abuse Per history, patient had generalized anasarca and worsening pedal edema since October. Presented with significant ascites and has required multiple paracentesis so far during this admission. Continue lasix, spironolactone Last paracentesis on 2/8 with  drainage of 3L of clear yellow liquid. LFTs have been stable. Ultrasound showed cirrhotic appearance of liver. Patient with previous history of alcohol abuse and history of hepatitis C.  Please note that patient has another medical record [name]. Patient is stable for the most part.  Abdomen is distended but not severely so.  Continue to monitor for now.  May need another paracentesis in a few days.  But hopefully the diuretics will also help.  Oropharyngeal dysphagia Secondary to stroke. Was on NG tube feedings.  Seen by speech therapy.  Now on dysphagia 1 diet.   After discussions with speech therapy and dietitian NG tube was removed on 2/13.   Calorie count is in progress.  Sodium level noted to be higher today at 149. If oral intake remains satisfactory and he does not need reinsertion of nasogastric tube then he may be able to return to his skilled nursing facility in the next few days.    Acute CVA/Prior CVA with left-sided deficits Primarily brought to the ED after patient was noted to be less interactive compared to her baseline. MRI showed left acute CVA. Stroke workup completed.  Echo showed EF of 55 to 60% with no cardiac source of embolism A1c 4.5, LDL 74 Because of active bleeding, patient is currently not on any antithrombotic or anticoagulant. Statin not started because of underlying liver cirrhosis Current deficit include expressive aphasia and dysphagia.  Continues to have left-sided weakness though improved range of motion noted in the lower extremities.   Hypoglycemia, chronic Continues to have hypoglycemia in the morning despite increased PO intake(and previously despite overnight feeds) Patient is not on any insulin or diabetic medications that could cause his recurrent hypoglycemia. Recommend late-night snack.  Discussed with nursing staff. Cortisol level  is 12.7.  Acute GI bleeding in the setting of liver cirrhosis, resolved Acute blood loss anemia, stable On  1/24, patient had an episode of hematochezia, hematemesis.  Tube feeding was stopped.  GI consultation was obtained.  Patient was started on Protonix. GI bleeding seems to have stabilized.  Hemoglobin roughly stable.  No EGD at this time per GI.   Hemoglobin has been stable.   Hypernatremia/normal anion gap metabolic acidosis Sodium level peaked at 158.  Improved after he was given D5 infusion along with free water down his NG tube. NG tube has been removed.  Level noted to be higher today at 149.  Encourage free water intake.  Continue to check labs daily. Bicarbonate level noted to be low this morning.  Will recheck tomorrow. Will cut back on the dose of furosemide.  Aspiration pneumonitis versus pneumonia On 1/20, patient vomited. Tube feeding was stopped.   Completed ceftriaxone - Respiratory status is stable.   Acute hepatic encephalopathy, improved but not back to previous baseline Ammonia level was initially 99, peak at 133, improved with lactulose Mentation is stable.   Acute kidney injury/hypokalemia, resolving Baseline creatinine around 1.   Presented with creatinine of 2.08, peaked at 2.12.  Likely has a new baseline now around 1.6-1.8. Monitor urine output.  Avoid nephrotoxic agent. Replace potassium.  Thrombocytopenia Secondary to liver cirrhosis.  Counts are stable.   COPD Without acute exacerbation  Continue bronchodilators   BPH Flomax   Impaired mobility PT evaluation obtained.  Return to SNF recommended   Goals of care Palliative care is following.  DSS is involved but no legal guardian yet. [facility] for guardianship [name], [phone]). On 1/29, palliative care reached out to DSS again.  They were told that petition was filed on 1/29 for guardianship.   Ethics was involved.  Remains full code as of now.  Moderate calorie malnutrition Nutrition Problem: Moderate Malnutrition Etiology: chronic illness  DVT Prophylaxis:  SCDs Code Status: Full code Family Communication: None available, no legal guardian Disposition Plan: SNF when medically stable.  Status is: Inpatient Remains inpatient appropriate because: Oropharyngeal dysphagia requiring NG tube feedings  Medications: Scheduled:  famotidine  20 mg Oral BID   furosemide  40 mg Oral BID   lactulose  10 g Oral BID   latanoprost  1 drop Both Eyes QHS   metoprolol tartrate  25 mg Oral BID   pantoprazole (PROTONIX) IV  40 mg Intravenous Q24H   spironolactone  25 mg Oral Daily   tamsulosin  0.4 mg Oral Daily   Continuous:   PRN:[DISCONTINUED] acetaminophen **OR** acetaminophen (TYLENOL) oral liquid 160 mg/5 mL **OR** acetaminophen, dextrose, Gerhardt's butt cream, ipratropium-albuterol, ondansetron (ZOFRAN) IV, senna-docusate  Antibiotics: Anti-infectives (From admission, onward)    Start     Dose/Rate Route Frequency Ordered Stop   10/02/22 1000  cefTRIAXone (ROCEPHIN) 1 g in sodium chloride 0.9 % 100 mL IVPB        1 g 200 mL/hr over 30 Minutes Intravenous Every 24 hours 10/02/22 0855 10/08/22 1311   09/10/22 1345  cefTRIAXone (ROCEPHIN) 1 g in sodium chloride 0.9 % 100 mL IVPB        1 g 200 mL/hr over 30 Minutes Intravenous  Once 09/10/22 1331 09/10/22 1605   09/10/22 1345  azithromycin (ZITHROMAX) 500 mg in sodium chloride 0.9 % 250 mL IVPB        500 mg 250 mL/hr over 60 Minutes Intravenous  Once 09/10/22 1331 09/10/22 1605  Objective:  Vital Signs  Vitals:   10/17/22 2316 10/18/22 0416 10/18/22 0500 10/18/22 0750  BP: (!) 160/91 (!) 146/70  (!) 144/73  Pulse: 75 70  79  Resp: 16 18  16   Temp: 98.4 F (36.9 C) 98.8 F (37.1 C)  98.4 F (36.9 C)  TempSrc: Oral Axillary    SpO2: 91% 94%  99%  Weight:   65.9 kg   Height:        Intake/Output Summary (Last 24 hours) at 10/18/2022 0953 Last data filed at 10/17/2022 2007 Gross per 24 hour  Intake 120 ml  Output 1150 ml  Net -1030 ml    Filed Weights   10/13/22 0445  10/14/22 0500 10/18/22 0500  Weight: 64.7 kg 65.2 kg 65.9 kg    general appearance: Awake alert.  In no distress Resp: Clear to auscultation bilaterally.  Normal effort Cardio: S1-S2 is normal regular.  No S3-S4.  No rubs murmurs or bruit GI: Abdomen is mildly distended.  Ascites noted.  Nontender.   Extremities: No edema.  Hemiparesis.  Lab Results:  Data Reviewed: I have personally reviewed following labs and reports of the imaging studies  CBC: Recent Labs  Lab 10/13/22 0539 10/15/22 0653 10/17/22 0314  WBC 7.6 6.8 7.3  HGB 8.3* 9.0* 9.6*  HCT 24.1* 26.1* 29.8*  MCV 103.0* 102.0* 106.0*  PLT 83* 80* 83*     Basic Metabolic Panel: Recent Labs  Lab 10/13/22 0539 10/15/22 0653 10/17/22 0314 10/18/22 0635  NA 134* 140 143 149*  K 3.9 3.5 3.4* 4.3  CL 109 112* 112* 119*  CO2 18* 18* 22 17*  GLUCOSE 99 76 103* 83  BUN 68* 76* 67* 62*  CREATININE 1.67* 1.67* 1.66* 1.70*  CALCIUM 7.4* 8.0* 8.0* 8.0*     GFR: Estimated Creatinine Clearance: 41.5 mL/min (A) (by C-G formula based on SCr of 1.7 mg/dL (H)).   CBG: Recent Labs  Lab 10/17/22 2005 10/17/22 2312 10/18/22 0346 10/18/22 0748 10/18/22 0827  GLUCAP 100* 75 94 69* 101*      Radiology Studies: No results found.     LOS: 37 days   Jacob Bass on [email or web address]  10/18/2022, 9:53 AM

## 2022-10-19 DIAGNOSIS — R1312 Dysphagia, oropharyngeal phase: Secondary | ICD-10-CM | POA: Diagnosis not present

## 2022-10-19 DIAGNOSIS — E870 Hyperosmolality and hypernatremia: Secondary | ICD-10-CM | POA: Diagnosis not present

## 2022-10-19 DIAGNOSIS — I639 Cerebral infarction, unspecified: Secondary | ICD-10-CM | POA: Diagnosis not present

## 2022-10-19 LAB — CBC
HCT: 28.6 % — ABNORMAL LOW (ref 39.0–52.0)
Hemoglobin: 9.3 g/dL — ABNORMAL LOW (ref 13.0–17.0)
MCH: 34.7 pg — ABNORMAL HIGH (ref 26.0–34.0)
MCHC: 32.5 g/dL (ref 30.0–36.0)
MCV: 106.7 fL — ABNORMAL HIGH (ref 80.0–100.0)
Platelets: 80 10*3/uL — ABNORMAL LOW (ref 150–400)
RBC: 2.68 MIL/uL — ABNORMAL LOW (ref 4.22–5.81)
RDW: 19 % — ABNORMAL HIGH (ref 11.5–15.5)
WBC: 7.9 10*3/uL (ref 4.0–10.5)
nRBC: 0 % (ref 0.0–0.2)

## 2022-10-19 LAB — GLUCOSE, CAPILLARY
Glucose-Capillary: 119 mg/dL — ABNORMAL HIGH (ref 70–99)
Glucose-Capillary: 119 mg/dL — ABNORMAL HIGH (ref 70–99)
Glucose-Capillary: 137 mg/dL — ABNORMAL HIGH (ref 70–99)
Glucose-Capillary: 71 mg/dL (ref 70–99)
Glucose-Capillary: 80 mg/dL (ref 70–99)
Glucose-Capillary: 97 mg/dL (ref 70–99)

## 2022-10-19 LAB — BASIC METABOLIC PANEL
Anion gap: 8 (ref 5–15)
BUN: 55 mg/dL — ABNORMAL HIGH (ref 8–23)
CO2: 22 mmol/L (ref 22–32)
Calcium: 8 mg/dL — ABNORMAL LOW (ref 8.9–10.3)
Chloride: 121 mmol/L — ABNORMAL HIGH (ref 98–111)
Creatinine, Ser: 1.8 mg/dL — ABNORMAL HIGH (ref 0.61–1.24)
GFR, Estimated: 42 mL/min — ABNORMAL LOW (ref >60)
Glucose, Bld: 76 mg/dL (ref 70–99)
Potassium: 4 mmol/L (ref 3.5–5.1)
Sodium: 151 mmol/L — ABNORMAL HIGH (ref 135–145)

## 2022-10-19 MED ORDER — DEXTROSE 5 % IV SOLN: INTRAVENOUS | Status: DC

## 2022-10-19 NOTE — Progress Notes (Signed)
Physical Therapy Treatment Patient Details Name: Jacob Bass MRN: [id number] DOB: [birth year] Today's Date: 10/19/2022   History of Present Illness 62 y.o. male presents to [facility] hospital on 09/10/2022 with AMS and L weakness from [facility]. 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 Complicated by fluid overload. Paracentesis performed 1/14, 1/16, 1/21, 1/27. PMH includes CVA, COPD, cirrhosis, gastric ulcer.    PT Comments    Pt received in supine, PTA re-entering room to work with pt again as both RN/NT busy and pt still needing hygiene and repositioning assist and to address skin breakdown. Pt gown/bed linens soiled, NT from different area of unit called to room to assist and pt needing mod to maxA for rolling to L/R sides with techniques practiced in last session (cross-body reaching, assisted R foot planting) practiced and pt able to assist with rolling using hospital bed features. Pt totalA for hygiene assist and foam dressing placed on area of skin breakdown on his buttock and barrier ointment applied to skin breakdown in peri/scrotal area. Pt positioned with additional pillows and Prevalon boots for skin protection and pressure relief. Pt continues to benefit from PT services to progress toward functional mobility goals.  Recommendations for follow up therapy are one component of a multi-disciplinary discharge planning process, led by the attending physician.  Recommendations may be updated based on patient status, additional functional criteria and insurance authorization.  Follow Up Recommendations  Skilled nursing-short term rehab (<3 hours/day) Can patient physically be transported by private vehicle: No   Assistance Recommended at Discharge Frequent or constant Supervision/Assistance  Patient can return home with the following Two people to help with walking and/or transfers;Two people to help with bathing/dressing/bathroom;Assistance with cooking/housework;Assistance with feeding;Direct  supervision/assist for medications management;Direct supervision/assist for financial management;Assist for transportation;Help with stairs or ramp for entrance   Equipment Recommendations  Wheelchair (measurements PT);Wheelchair cushion (measurements PT);Hospital bed;Other (comment) (hoyer lift)    Recommendations for Other Services       Precautions / Restrictions Precautions Precautions: Fall Restrictions Weight Bearing Restrictions: No     Mobility  Bed Mobility Overal bed mobility: Needs Assistance Bed Mobility: Rolling Rolling: Mod assist, Max assist         General bed mobility comments: Rolling to L side with modA, pt initiating roll when cued each time;maxA to roll toward his R side and pt able to assist with RUE grasping R side of mattress to assist with pulling his body toward R as his LLE/LUE unable to assist    Transfers Overall transfer level: Needs assistance                 General transfer comment: pt with multiple loose BMs so pt unable to attempt    Ambulation/Gait                   Stairs             Wheelchair Mobility    Modified Rankin (Stroke Patients Only) Modified Rankin (Stroke Patients Only) Pre-Morbid Rankin Score: Moderate disability Modified Rankin: Severe disability     Balance Overall balance assessment: Needs assistance                                          Cognition Arousal/Alertness: Awake/alert Behavior During Therapy: Flat affect, Restless Overall Cognitive Status: No family/caregiver present to determine baseline cognitive functioning  General Comments: Pt called out at one point during repositioning/hygiene assist at NT, he seems familiar with this staff member, but otherwise following commands, pt occasionally nodding yes/no in response to ~50% of questions but not verbalizing otherwise.        Exercises General Exercises - Lower  Extremity Short Arc Quad: AAROM, Right, 5 reps, 10 reps Heel Slides: AAROM, Both, PROM, 5 reps, Supine Hip ABduction/ADduction: AAROM, Right, 5 reps, Sidelying Other Exercises Other Exercises: cross-body reaching to rail and pulling forward/in to rail x3 reps Other Exercises: R foot planted on bed and PTA stabilizing this foot with vc/tactile cues for R hip extension to improve ability to roll to L side x3 reps    General Comments General comments (skin integrity, edema, etc.): foam dressing placed on area of skin breakdown on his bottom, RN notified. pillow placed behind his R hip/lower back to relieve pressure and Prevalon air boots placed to reduce pressure on his heels and feet, pillow under his L lateral knee to optimize sidelying position. NT present and placed barrier ointment to his posterior scrotal area which appeared to have some skin breakdown after hygiene assist, RN notified.      Pertinent Vitals/Pain Pain Assessment Pain Assessment: PAINAD Faces Pain Scale: No hurt Breathing: normal Negative Vocalization: occasional moan/groan, low speech, negative/disapproving quality Facial Expression: smiling or inexpressive Body Language: tense, distressed pacing, fidgeting Consolability: distracted or reassured by voice/touch PAINAD Score: 3 Pain Location: generalized with mobility and hygiene assist Pain Descriptors / Indicators: Discomfort, Grimacing Pain Intervention(s): Monitored during session, Repositioned, Limited activity within patient's tolerance     PT Goals (current goals can now be found in the care plan section) Acute Rehab PT Goals Patient Stated Goal: not stated PT Goal Formulation: With patient Time For Goal Achievement: 10/23/22 Progress towards PT goals: Progressing toward goals (slowly)    Frequency    Min 2X/week      PT Plan Current plan remains appropriate       AM-PAC PT "6 Clicks" Mobility   Outcome Measure  Help needed turning from your  back to your side while in a flat bed without using bedrails?: A Lot Help needed moving from lying on your back to sitting on the side of a flat bed without using bedrails?: Total Help needed moving to and from a bed to a chair (including a wheelchair)?: Total Help needed standing up from a chair using your arms (e.g., wheelchair or bedside chair)?: Total Help needed to walk in hospital room?: Total Help needed climbing 3-5 steps with a railing? : Total 6 Click Score: 7    End of Session   Activity Tolerance: Other (comment) (multiple episodes of loose stools limiting mobility progression this date in second session as well) Patient left: in bed;with call bell/phone within reach;with bed alarm set;Other (comment) Nurse Communication: Mobility status;Other (comment) (RN notified of pt skin breakdown, foam dressing placed, pt with some scant red streaks/blood apparent in his stools; pt food arrived during session but he shook head no and will need assist to eat later in the day) PT Visit Diagnosis: Other abnormalities of gait and mobility (R26.89);Muscle weakness (generalized) (M62.81);Hemiplegia and hemiparesis Hemiplegia - Right/Left: Left Hemiplegia - dominant/non-dominant: Non-dominant Hemiplegia - caused by: Cerebral infarction     Time: 7253-6644 PT Time Calculation (min) (ACUTE ONLY): 14 min  Charges:  $Therapeutic Activity: 8-22 mins $Neuromuscular Re-education: 8-22 mins  Florina Ou., PTA Acute Rehabilitation Services Secure Chat Preferred 9a-5:30pm Office: [phone]    Dorathy Kinsman [facility] 10/19/2022, 2:25 PM

## 2022-10-19 NOTE — Progress Notes (Addendum)
Physical Therapy Treatment Patient Details Name: Jacob Bass MRN: [id number] DOB: 04-Sep-1960 Today's Date: 10/19/2022   History of Present Illness 62 y.o. male presents to [facility] hospital on 09/10/2022 with AMS and L weakness from [facility]. 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 Complicated by fluid overload. Paracentesis performed 1/14, 1/16, 1/21, 1/27. PMH includes CVA, COPD, cirrhosis, gastric ulcer.    PT Comments    Pt received in supine, agreeable to therapy session via head nod and approval per RN as pt with aphasia, pt with good participation but limited tolerance for mobility due to multiple loose stools. Pt performed rolling x6 reps in each direction, needing modA/maxA to roll to L side with PTA assisting him to stabilize R foot to encourage R hip extension and multimodal cues for cross-body reaching and pulling. Pt able to assist only minimally with shifting body to roll to R side, needing totalA to Roll R and for hygiene assist. RN/NT notified pt will need full bed bath and new foam dressings once he is done with bowel movement and positioned with new pad for absorption. Plan to work on seated balance and squat scoot transfers next session as tolerated. Pt continues to benefit from PT services to progress toward functional mobility goals.   Recommendations for follow up therapy are one component of a multi-disciplinary discharge planning process, led by the attending physician.  Recommendations may be updated based on patient status, additional functional criteria and insurance authorization.  Follow Up Recommendations  Skilled nursing-short term rehab (<3 hours/day) Can patient physically be transported by private vehicle: No   Assistance Recommended at Discharge Frequent or constant Supervision/Assistance  Patient can return home with the following Two people to help with walking and/or transfers;Two people to help with bathing/dressing/bathroom;Assistance with  cooking/housework;Assistance with feeding;Direct supervision/assist for medications management;Direct supervision/assist for financial management;Assist for transportation;Help with stairs or ramp for entrance   Equipment Recommendations  Wheelchair (measurements PT);Wheelchair cushion (measurements PT);Hospital bed;Other (comment) (hoyer lift)    Recommendations for Other Services       Precautions / Restrictions Precautions Precautions: Fall Restrictions Weight Bearing Restrictions: No     Mobility  Bed Mobility Overal bed mobility: Needs Assistance Bed Mobility: Rolling Rolling: Total assist, Mod assist         General bed mobility comments: Rolling to L side with modA, pt initiating roll when cued each time; totalA to roll toward his R side    Transfers Overall transfer level: Needs assistance                 General transfer comment: pt with multiple loose BMs so defer until after pt completed    Ambulation/Gait                   Stairs             Wheelchair Mobility    Modified Rankin (Stroke Patients Only) Modified Rankin (Stroke Patients Only) Pre-Morbid Rankin Score: Moderate disability Modified Rankin: Severe disability     Balance Overall balance assessment: Needs assistance                                          Cognition Arousal/Alertness: Awake/alert Behavior During Therapy: Flat affect, Restless Overall Cognitive Status: No family/caregiver present to determine baseline cognitive functioning  General Comments: no verbalizations this session, pt often rolling himself toward his L side despite cues to roll back for placement of bed pan. Pt attempting to remove bed pan despite cues for pt to roll back onto it, so ultimately new pad placed and bed pan removed        Exercises General Exercises - Lower Extremity Short Arc Quad: AAROM, Right, 5 reps, 10  reps Heel Slides: Both, 10 reps, Supine, PROM, AAROM (pt initiating on RLE with cues, needs PROM for LLE) Hip ABduction/ADduction: AAROM, Right, 5 reps, Sidelying Other Exercises Other Exercises: cross-body reaching to rail and pulling forward/in to rail for R bicep activation/strengthening x10 reps Other Exercises: R foot planted on bed and PTA stabilizing this foot with vc/tactile cues for R hip extension to improve ability to roll to L side x3 reps    General Comments General comments (skin integrity, edema, etc.): skin breakdown on his bottom/posterior peri area      Pertinent Vitals/Pain Pain Assessment Pain Assessment: PAINAD Faces Pain Scale: No hurt Breathing: normal Negative Vocalization: none Facial Expression: smiling or inexpressive Body Language: tense, distressed pacing, fidgeting Consolability: distracted or reassured by voice/touch PAINAD Score: 2 Pain Location: generalized with mobility and hygiene assist Pain Descriptors / Indicators: Discomfort, Grimacing Pain Intervention(s): Monitored during session, Limited activity within patient's tolerance, Repositioned    Home Living                          Prior Function            PT Goals (current goals can now be found in the care plan section) Acute Rehab PT Goals Patient Stated Goal: not stated PT Goal Formulation: With patient Time For Goal Achievement: 10/23/22 Progress towards PT goals: Progressing toward goals (slowly)    Frequency    Min 2X/week      PT Plan Current plan remains appropriate    Co-evaluation              AM-PAC PT "6 Clicks" Mobility   Outcome Measure  Help needed turning from your back to your side while in a flat bed without using bedrails?: A Lot Help needed moving from lying on your back to sitting on the side of a flat bed without using bedrails?: Total Help needed moving to and from a bed to a chair (including a wheelchair)?: Total Help needed standing  up from a chair using your arms (e.g., wheelchair or bedside chair)?: Total Help needed to walk in hospital room?: Total Help needed climbing 3-5 steps with a railing? : Total 6 Click Score: 7    End of Session   Activity Tolerance: Other (comment) (multiple episodes of loose stools limiting mobility progression this date) Patient left: in bed;with call bell/phone within reach;with bed alarm set;Other (comment) (RN notified pt needs full bath/sheet change and clean-up) Nurse Communication: Mobility status;Other (comment) (pt needs foam dressing on bottom/peri area for skin breakdown) PT Visit Diagnosis: Other abnormalities of gait and mobility (R26.89);Muscle weakness (generalized) (M62.81);Hemiplegia and hemiparesis Hemiplegia - Right/Left: Left Hemiplegia - dominant/non-dominant: Non-dominant Hemiplegia - caused by: Cerebral infarction     Time: 1235-1252 PT Time Calculation (min) (ACUTE ONLY): 17 min  Charges:  $Neuromuscular Re-education: 8-22 mins                     Tiegan Jambor P., PTA Acute Rehabilitation Services Secure Chat Preferred 9a-5:30pm Office: [phone]    Dorathy Kinsman [facility] 10/19/2022,  1:10 PM

## 2022-10-19 NOTE — Progress Notes (Signed)
[facility] PROGRESS NOTE   Sem Alfonse Ras WGN:[id number] DOB: [birth year] DOA: 09/10/2022  PCP: Donia Ast., MD  Brief History/Interval Summary:  62 y.o. male with PMH significant for history of stroke with residual left-sided deficits, HTN, liver cirrhosis, chronic anemia, COPD, recent diagnosis of COVID who lives at [facility]. On 1/8, patient was sent to the ED for altered mental status and weakness.  Last known normal the previous night. MRI brain positive for small acute infarct. Because of stroke, patient has dysphagia, expressive aphasia and worsening of left-sided deficits.   His hospital course has been complicated by recurrent ascites and dysphagia.  He has required multiple paracentesis.  Started on nasogastric tube feedings. Not candidate for PEG tube placement due to recurrent ascites. Feedings had to be held for 2 days due to recurrent aspirations and tube clogging.  Now on nocturnal feeds. Palliative care consulted.  Legal guardianship is in process.  Poor prognosis. Patient remains full code.  Patient disposition continues to be complicated due to his need for DSS/Guardianship - meeting 10/11/22 preliminary hearing completed. Next meeting 10/24/22 to hopefully decide guardianship and further medical care planning. Once guardianship has been confirmed may need to discuss long term goals of care given he cannot have PEG tube (secondary to ascites).  Consultants: Palliative care.  Subjective/Interval History: Patient has aphasia due to stroke.  Unable to communicate effectively.  No discomfort noted.  No further bloody bowel movements reported.  Assessment/Plan:  Liver cirrhosis with anasarca/ascites/history of hepatitis C and alcohol abuse Per history, patient had generalized anasarca and worsening pedal edema since October. Presented with significant ascites and has required multiple paracentesis so far during this admission. Continue lasix, spironolactone Last  paracentesis on 2/8 with drainage of 3L of clear yellow liquid. LFTs have been stable. Ultrasound showed cirrhotic appearance of liver. Patient with previous history of alcohol abuse and history of hepatitis C.  Please note that patient has another medical record MRN [id number]. Patient is stable for the most part.  Abdomen is distended but not severely so.  Continue to monitor for now.  May need another paracentesis in a few days.  But hopefully the diuretics will also help.  Acute GI bleeding in the setting of liver cirrhosis Acute blood loss anemia, stable On 1/24, patient had an episode of hematochezia, hematemesis.  Tube feeding was stopped.  GI consultation was obtained.  Patient was started on Protonix.  GI did not feel that endoscopy was indicated. Had recurrence of hematochezia on 2 2/15.  No drop in hemoglobin noted.  Continue to monitor for now.   Oropharyngeal dysphagia Secondary to stroke. Was on NG tube feedings.  Seen by speech therapy.  Now on dysphagia 1 diet.   After discussions with speech therapy and dietitian NG tube was removed on 2/13.   Patient does have good meal intake but his fluid intake is less than adequate.  He is not cleared for thin liquids. Sodium level continues to climb.  His creatinine is also climbing.  He will need IV fluids. Will initiate IV fluids today and monitor labs.  Will also have speech therapy reevaluate to see if he can be cleared for thin liquids.  If he is not cleared for thin liquids and if he continues to have recurrence of hyponatremia being off of IV fluids then NG tube may need to be replaced.  Acute CVA/Prior CVA with left-sided deficits Primarily brought to the ED after patient was noted to be less interactive compared to  her baseline. MRI showed left acute CVA. Stroke workup completed.  Echo showed EF of 55 to 60% with no cardiac source of embolism A1c 4.5, LDL 74 Because of active bleeding, patient is currently not on any  antithrombotic or anticoagulant. Statin not started because of underlying liver cirrhosis Current deficit include expressive aphasia and dysphagia.  Continues to have left-sided weakness though improved range of motion noted in the lower extremities.   Hypoglycemia, chronic Continues to have hypoglycemia in the morning despite increased PO intake(and previously despite overnight feeds) Patient is not on any insulin or diabetic medications that could cause his recurrent hypoglycemia.  His liver disease is likely contributing. Recommend late-night snack.  Discussed with nursing staff. Cortisol level is 12.7.  Hypernatremia/normal anion gap metabolic acidosis Sodium level peaked at 158.  Improved after he was given D5 infusion along with free water down his NG tube. NG tube has been removed.  Sodium level is again increasing as discussed above.  Will initiate D5 infusion.   Dose of furosemide was decreased yesterday.  Acute kidney injury/hypokalemia Baseline creatinine around 1.   Presented with creatinine of 2.08, peaked at 2.12.  Likely has a new baseline now around 1.6-1.8.  Creatinine has been rising for the past 2 days.  Could be due to poor fluid intake.  IV fluids to be initiated as discussed above.  Monitor urine output.  Avoid nephrotoxic agent. Potassium is noted to be normal today.  Aspiration pneumonitis versus pneumonia On 1/20, patient vomited. Tube feeding was stopped.   Completed ceftriaxone - Respiratory status is stable.   Acute hepatic encephalopathy, improved but not back to previous baseline Ammonia level was initially 99, peak at 133, improved with lactulose Mentation is stable.   Thrombocytopenia Secondary to liver cirrhosis.  Counts are stable.   COPD Without acute exacerbation  Continue bronchodilators   BPH Flomax   Impaired mobility PT evaluation obtained.  Return to SNF recommended   Goals of care Palliative care is following.  DSS is involved but  no legal guardian yet. [facility] for guardianship [name], [phone]). On 1/29, palliative care reached out to DSS again.  They were told that petition was filed on 1/29 for guardianship.   Ethics was involved.  Remains full code as of now.  Moderate calorie malnutrition Nutrition Problem: Moderate Malnutrition Etiology: chronic illness  DVT Prophylaxis: SCDs Code Status: Full code Family Communication: None available, no legal guardian Disposition Plan: SNF when medically stable.  Status is: Inpatient Remains inpatient appropriate because: Oropharyngeal dysphagia requiring NG tube feedings  Medications: Scheduled:  furosemide  40 mg Oral Daily   lactulose  10 g Oral BID   latanoprost  1 drop Both Eyes QHS   metoprolol tartrate  25 mg Oral BID   pantoprazole (PROTONIX) IV  40 mg Intravenous Q24H   spironolactone  25 mg Oral Daily   tamsulosin  0.4 mg Oral Daily   Continuous:   PRN:[DISCONTINUED] acetaminophen **OR** acetaminophen (TYLENOL) oral liquid 160 mg/5 mL **OR** acetaminophen, dextrose, Gerhardt's butt cream, ipratropium-albuterol, ondansetron (ZOFRAN) IV, senna-docusate  Antibiotics: Anti-infectives (From admission, onward)    Start     Dose/Rate Route Frequency Ordered Stop   10/02/22 1000  cefTRIAXone (ROCEPHIN) 1 g in sodium chloride 0.9 % 100 mL IVPB        1 g 200 mL/hr over 30 Minutes Intravenous Every 24 hours 10/02/22 0855 10/08/22 1311   09/10/22 1345  cefTRIAXone (ROCEPHIN) 1 g in sodium chloride 0.9 % 100  mL IVPB        1 g 200 mL/hr over 30 Minutes Intravenous  Once 09/10/22 1331 09/10/22 1605   09/10/22 1345  azithromycin (ZITHROMAX) 500 mg in sodium chloride 0.9 % 250 mL IVPB        500 mg 250 mL/hr over 60 Minutes Intravenous  Once 09/10/22 1331 09/10/22 1605       Objective:  Vital Signs  Vitals:   10/18/22 2110 10/19/22 0009 10/19/22 0352 10/19/22 0746  BP: 139/81 (!) 153/86 (!) 153/77 129/75  Pulse: 82 94 76 72   Resp:  16 16 16   Temp: 98.2 F (36.8 C) 99 F (37.2 C) 97.9 F (36.6 C) 97.8 F (36.6 C)  TempSrc: Oral   Oral  SpO2: 97% 90% 97% 99%  Weight:      Height:        Intake/Output Summary (Last 24 hours) at 10/19/2022 1024 Last data filed at 10/19/2022 0011 Gross per 24 hour  Intake --  Output 800 ml  Net -800 ml    Filed Weights   10/13/22 0445 10/14/22 0500 10/18/22 0500  Weight: 64.7 kg 65.2 kg 65.9 kg   General appearance: Awake alert.  In no distress Resp: Clear to auscultation bilaterally.  Normal effort Cardio: S1-S2 is normal regular.  No S3-S4.  No rubs murmurs or bruit GI: Abdomen is soft.  Nontender nondistended.  Bowel sounds are present normal.  No masses organomegaly Extremities: No edema.    Lab Results:  Data Reviewed: I have personally reviewed following labs and reports of the imaging studies  CBC: Recent Labs  Lab 10/13/22 0539 10/15/22 0653 10/17/22 0314 10/18/22 1226 10/19/22 0714  WBC 7.6 6.8 7.3 7.9 7.9  HGB 8.3* 9.0* 9.6* 9.5* 9.3*  HCT 24.1* 26.1* 29.8* 29.1* 28.6*  MCV 103.0* 102.0* 106.0* 105.1* 106.7*  PLT 83* 80* 83* 85* 80*     Basic Metabolic Panel: Recent Labs  Lab 10/13/22 0539 10/15/22 0653 10/17/22 0314 10/18/22 0635 10/19/22 0714  NA 134* 140 143 149* 151*  K 3.9 3.5 3.4* 4.3 4.0  CL 109 112* 112* 119* 121*  CO2 18* 18* 22 17* 22  GLUCOSE 99 76 103* 83 76  BUN 68* 76* 67* 62* 55*  CREATININE 1.67* 1.67* 1.66* 1.70* 1.80*  CALCIUM 7.4* 8.0* 8.0* 8.0* 8.0*     GFR: Estimated Creatinine Clearance: 39.2 mL/min (A) (by C-G formula based on SCr of 1.8 mg/dL (H)).   CBG: Recent Labs  Lab 10/18/22 1747 10/18/22 2114 10/19/22 0007 10/19/22 0352 10/19/22 0745  GLUCAP 148* 98 119* 97 71      Radiology Studies: No results found.     LOS: 38 days   Macdonald Rigor [employer] on [email or web address]  10/19/2022, 10:24 AM

## 2022-10-19 NOTE — Plan of Care (Signed)
Patient has progressed with diet today eating 50% or more of his meals today.

## 2022-10-20 DIAGNOSIS — E870 Hyperosmolality and hypernatremia: Secondary | ICD-10-CM | POA: Diagnosis not present

## 2022-10-20 DIAGNOSIS — I639 Cerebral infarction, unspecified: Secondary | ICD-10-CM | POA: Diagnosis not present

## 2022-10-20 DIAGNOSIS — R1312 Dysphagia, oropharyngeal phase: Secondary | ICD-10-CM | POA: Diagnosis not present

## 2022-10-20 LAB — BASIC METABOLIC PANEL
Anion gap: 9 (ref 5–15)
BUN: 44 mg/dL — ABNORMAL HIGH (ref 8–23)
CO2: 24 mmol/L (ref 22–32)
Calcium: 8 mg/dL — ABNORMAL LOW (ref 8.9–10.3)
Chloride: 117 mmol/L — ABNORMAL HIGH (ref 98–111)
Creatinine, Ser: 1.67 mg/dL — ABNORMAL HIGH (ref 0.61–1.24)
GFR, Estimated: 46 mL/min — ABNORMAL LOW (ref >60)
Glucose, Bld: 104 mg/dL — ABNORMAL HIGH (ref 70–99)
Potassium: 3.8 mmol/L (ref 3.5–5.1)
Sodium: 150 mmol/L — ABNORMAL HIGH (ref 135–145)

## 2022-10-20 LAB — GLUCOSE, CAPILLARY
Glucose-Capillary: 101 mg/dL — ABNORMAL HIGH (ref 70–99)
Glucose-Capillary: 104 mg/dL — ABNORMAL HIGH (ref 70–99)
Glucose-Capillary: 106 mg/dL — ABNORMAL HIGH (ref 70–99)
Glucose-Capillary: 139 mg/dL — ABNORMAL HIGH (ref 70–99)
Glucose-Capillary: 155 mg/dL — ABNORMAL HIGH (ref 70–99)
Glucose-Capillary: 77 mg/dL (ref 70–99)
Glucose-Capillary: 97 mg/dL (ref 70–99)

## 2022-10-20 LAB — CBC
HCT: 31.1 % — ABNORMAL LOW (ref 39.0–52.0)
Hemoglobin: 9.8 g/dL — ABNORMAL LOW (ref 13.0–17.0)
MCH: 34.3 pg — ABNORMAL HIGH (ref 26.0–34.0)
MCHC: 31.5 g/dL (ref 30.0–36.0)
MCV: 108.7 fL — ABNORMAL HIGH (ref 80.0–100.0)
Platelets: 81 10*3/uL — ABNORMAL LOW (ref 150–400)
RBC: 2.86 MIL/uL — ABNORMAL LOW (ref 4.22–5.81)
RDW: 18.6 % — ABNORMAL HIGH (ref 11.5–15.5)
WBC: 7.7 10*3/uL (ref 4.0–10.5)
nRBC: 0 % (ref 0.0–0.2)

## 2022-10-20 NOTE — Progress Notes (Signed)
[facility] PROGRESS NOTE   Jacob Bass NFA:[id number] DOB: 06/01/1961 DOA: 09/10/2022  PCP: Donia Ast., MD  Brief History/Interval Summary:  62 y.o. male with PMH significant for history of stroke with residual left-sided deficits, HTN, liver cirrhosis, chronic anemia, COPD, recent diagnosis of COVID who lives at [facility]. On 1/8, patient was sent to the ED for altered mental status and weakness.  Last known normal the previous night. MRI brain positive for small acute infarct. Because of stroke, patient has dysphagia, expressive aphasia and worsening of left-sided deficits.   His hospital course has been complicated by recurrent ascites and dysphagia.  He has required multiple paracentesis.  Started on nasogastric tube feedings. Not candidate for PEG tube placement due to recurrent ascites. Feedings had to be held for 2 days due to recurrent aspirations and tube clogging.  Now on nocturnal feeds. Palliative care consulted.  Legal guardianship is in process.  Poor prognosis. Patient remains full code.  Patient disposition continues to be complicated due to his need for DSS/Guardianship - meeting 10/11/22 preliminary hearing completed. Next meeting 10/24/22 to hopefully decide guardianship and further medical care planning. Once guardianship has been confirmed may need to discuss long term goals of care given he cannot have PEG tube (secondary to ascites).  Consultants: Palliative care.  Subjective/Interval History: Patient has aphasia due to stroke.  Unable to communicate effectively.  Patient denies any complaints.  No further episodes of GI bleed reported.  Assessment/Plan:  Liver cirrhosis with anasarca/ascites/history of hepatitis C and alcohol abuse Per history, patient had generalized anasarca and worsening pedal edema since October. Presented with significant ascites and has required multiple paracentesis so far during this admission. Continue lasix,  spironolactone Last paracentesis on 2/8 with drainage of 3L of clear yellow liquid. LFTs have been stable. Ultrasound showed cirrhotic appearance of liver. Patient with previous history of alcohol abuse and history of hepatitis C.  Please note that patient has another medical record MRN [id number]. Patient remains stable for the most part.  Abdomen is distended but not severely so.  Continue diuretics for now.  He may need another paracentesis in a few days.  Acute GI bleeding in the setting of liver cirrhosis Acute blood loss anemia, stable On 1/24, patient had an episode of hematochezia, hematemesis.  Tube feeding was stopped.  GI consultation was obtained.  Patient was started on Protonix.  GI did not feel that endoscopy was indicated. Had recurrence of hematochezia on 2 2/15.  No drop in hemoglobin noted.  Continue to monitor for now.   Oropharyngeal dysphagia Secondary to stroke. Was on NG tube feedings.  Seen by speech therapy.  Now on dysphagia 1 diet.   After discussions with speech therapy and dietitian NG tube was removed on 2/13.   Patient does have good meal intake but his fluid intake is less than adequate.  He is not cleared for thin liquids. Sodium continued to increase and so he was placed on D5 infusion. Speech therapy to reevaluate if he can be cleared for thin liquids.  If he is not cleared for thin liquids and if he continues to have recurrence of hypernatremia being off of IV fluids then NG tube may need to be replaced.  Acute CVA/Prior CVA with left-sided deficits Primarily brought to the ED after patient was noted to be less interactive compared to her baseline. MRI showed left acute CVA. Stroke workup completed.  Echo showed EF of 55 to 60% with no cardiac source of embolism  A1c 4.5, LDL 74 Because of GI bleeding, patient is currently not on any antithrombotic or anticoagulant. Statin not started because of underlying liver cirrhosis Current deficit include expressive  aphasia and dysphagia.  Continues to have left-sided weakness though improved range of motion noted in the lower extremities.   Hypoglycemia, chronic Continues to have hypoglycemia in the morning despite increased PO intake (and previously despite overnight feeds) Patient is not on any insulin or diabetic medications that could cause his recurrent hypoglycemia.  His liver disease is likely contributing. Recommend late-night snack.  Discussed with nursing staff. Cortisol level is 12.7. CBGs have been stable for the past 48 hours.  Hypernatremia/normal anion gap metabolic acidosis Previously his sodium level had peaked at 158.  Improved after he was given D5 infusion along with free water down his NG tube. NG tube has been removed.  Sodium level is again increasing as discussed above.  Started on D5 infusion.  Recheck labs tomorrow.    Acute kidney injury/hypokalemia Baseline creatinine around 1.   Presented with creatinine of 2.08, peaked at 2.12.  Likely has a new baseline now around 1.6-1.8.  Rising creatinine was likely due to poor free water intake.  Started on IV fluids with improvement in renal function.  Continue to monitor urine output.  Avoid nephrotoxic agents.  Potassium level is normal.  Aspiration pneumonitis versus pneumonia On 1/20, patient vomited. Tube feeding was stopped.   Completed ceftriaxone - Respiratory status is stable.   Acute hepatic encephalopathy, improved but not back to previous baseline Ammonia level was initially 99, peak at 133, improved with lactulose Mentation is stable.   Thrombocytopenia Secondary to liver cirrhosis.  Counts are stable.   COPD Without acute exacerbation  Continue bronchodilators   BPH Flomax   Impaired mobility PT evaluation obtained.  Return to SNF recommended   Goals of care Palliative care is following.  DSS is involved but no legal guardian yet. [name] for guardianship [name],  [phone]). On 1/29, palliative care reached out to DSS again.  They were told that petition was filed on 1/29 for guardianship.   Ethics was involved.  Remains full code as of now.  Moderate calorie malnutrition Nutrition Problem: Moderate Malnutrition Etiology: chronic illness  DVT Prophylaxis: SCDs Code Status: Full code Family Communication: None available, no legal guardian Disposition Plan: SNF when medically stable.  Status is: Inpatient Remains inpatient appropriate because: Oropharyngeal dysphagia requiring NG tube feedings  Medications: Scheduled:  furosemide  40 mg Oral Daily   lactulose  10 g Oral BID   latanoprost  1 drop Both Eyes QHS   metoprolol tartrate  25 mg Oral BID   pantoprazole (PROTONIX) IV  40 mg Intravenous Q24H   tamsulosin  0.4 mg Oral Daily   Continuous:  dextrose 75 mL/hr at 10/19/22 2000    PRN:[DISCONTINUED] acetaminophen **OR** acetaminophen (TYLENOL) oral liquid 160 mg/5 mL **OR** acetaminophen, dextrose, Gerhardt's butt cream, ipratropium-albuterol, ondansetron (ZOFRAN) IV, senna-docusate  Antibiotics: Anti-infectives (From admission, onward)    Start     Dose/Rate Route Frequency Ordered Stop   10/02/22 1000  cefTRIAXone (ROCEPHIN) 1 g in sodium chloride 0.9 % 100 mL IVPB        1 g 200 mL/hr over 30 Minutes Intravenous Every 24 hours 10/02/22 0855 10/08/22 1311   09/10/22 1345  cefTRIAXone (ROCEPHIN) 1 g in sodium chloride 0.9 % 100 mL IVPB        1 g 200 mL/hr over 30 Minutes Intravenous  Once  09/10/22 1331 09/10/22 1605   09/10/22 1345  azithromycin (ZITHROMAX) 500 mg in sodium chloride 0.9 % 250 mL IVPB        500 mg 250 mL/hr over 60 Minutes Intravenous  Once 09/10/22 1331 09/10/22 1605       Objective:  Vital Signs  Vitals:   10/19/22 2310 10/20/22 0331 10/20/22 0500 10/20/22 0802  BP: (!) 141/77 (!) 145/82  (!) 158/85  Pulse: 74 77  72  Resp: 16 16  18   Temp: 98.2 F (36.8 C) (!) 97.5 F (36.4 C)  97.8 F (36.6  C)  TempSrc: Oral Oral  Oral  SpO2: 100% 95%  100%  Weight:   55.8 kg   Height:        Intake/Output Summary (Last 24 hours) at 10/20/2022 0958 Last data filed at 10/20/2022 0400 Gross per 24 hour  Intake 1001.72 ml  Output 900 ml  Net 101.72 ml    Filed Weights   10/14/22 0500 10/18/22 0500 10/20/22 0500  Weight: 65.2 kg 65.9 kg 55.8 kg   General appearance: Awake alert.  In no distress Resp: Clear to auscultation bilaterally.  Normal effort Cardio: S1-S2 is normal regular.  No S3-S4.  No rubs murmurs or bruit GI: Abdomen is soft.  Nontender nondistended.  Bowel sounds are present normal.  No masses organomegaly Extremities: No edema.   Lab Results:  Data Reviewed: I have personally reviewed following labs and reports of the imaging studies  CBC: Recent Labs  Lab 10/15/22 0653 10/17/22 0314 10/18/22 1226 10/19/22 0714 10/20/22 0440  WBC 6.8 7.3 7.9 7.9 7.7  HGB 9.0* 9.6* 9.5* 9.3* 9.8*  HCT 26.1* 29.8* 29.1* 28.6* 31.1*  MCV 102.0* 106.0* 105.1* 106.7* 108.7*  PLT 80* 83* 85* 80* 81*     Basic Metabolic Panel: Recent Labs  Lab 10/15/22 0653 10/17/22 0314 10/18/22 0635 10/19/22 0714 10/20/22 0440  NA 140 143 149* 151* 150*  K 3.5 3.4* 4.3 4.0 3.8  CL 112* 112* 119* 121* 117*  CO2 18* 22 17* 22 24  GLUCOSE 76 103* 83 76 104*  BUN 76* 67* 62* 55* 44*  CREATININE 1.67* 1.66* 1.70* 1.80* 1.67*  CALCIUM 8.0* 8.0* 8.0* 8.0* 8.0*     GFR: Estimated Creatinine Clearance: 35.7 mL/min (A) (by C-G formula based on SCr of 1.67 mg/dL (H)).   CBG: Recent Labs  Lab 10/19/22 1618 10/19/22 1948 10/19/22 2316 10/20/22 0329 10/20/22 0758  GLUCAP 80 155* 137* 104* 106*      Radiology Studies: No results found.     LOS: 39 days   Merrisa Skorupski [employer] on [email or web address]  10/20/2022, 9:58 AM

## 2022-10-20 NOTE — Plan of Care (Signed)

## 2022-10-21 DIAGNOSIS — E870 Hyperosmolality and hypernatremia: Secondary | ICD-10-CM | POA: Diagnosis not present

## 2022-10-21 DIAGNOSIS — R1312 Dysphagia, oropharyngeal phase: Secondary | ICD-10-CM | POA: Diagnosis not present

## 2022-10-21 DIAGNOSIS — I639 Cerebral infarction, unspecified: Secondary | ICD-10-CM | POA: Diagnosis not present

## 2022-10-21 LAB — GLUCOSE, CAPILLARY
Glucose-Capillary: 93 mg/dL (ref 70–99)
Glucose-Capillary: 75 mg/dL (ref 70–99)
Glucose-Capillary: 97 mg/dL (ref 70–99)
Glucose-Capillary: 189 mg/dL — ABNORMAL HIGH (ref 70–99)
Glucose-Capillary: 77 mg/dL (ref 70–99)

## 2022-10-21 LAB — BASIC METABOLIC PANEL
Anion gap: 7 (ref 5–15)
BUN: 37 mg/dL — ABNORMAL HIGH (ref 8–23)
CO2: 23 mmol/L (ref 22–32)
Calcium: 7.7 mg/dL — ABNORMAL LOW (ref 8.9–10.3)
Chloride: 115 mmol/L — ABNORMAL HIGH (ref 98–111)
Creatinine, Ser: 1.56 mg/dL — ABNORMAL HIGH (ref 0.61–1.24)
GFR, Estimated: 50 mL/min — ABNORMAL LOW (ref >60)
Glucose, Bld: 94 mg/dL (ref 70–99)
Potassium: 3.9 mmol/L (ref 3.5–5.1)
Sodium: 145 mmol/L (ref 135–145)

## 2022-10-21 NOTE — Progress Notes (Signed)
[facility] PROGRESS NOTE   Jacob Bass ZOX:[id number] DOB: [birth year] DOA: 09/10/2022  PCP: Donia Ast., MD  Brief History/Interval Summary:  62 y.o. male with PMH significant for history of stroke with residual left-sided deficits, HTN, liver cirrhosis, chronic anemia, COPD, recent diagnosis of COVID who lives at [facility]. On 1/8, patient was sent to the ED for altered mental status and weakness.  Last known normal the previous night. MRI brain positive for small acute infarct. Because of stroke, patient has dysphagia, expressive aphasia and worsening of left-sided deficits.   His hospital course has been complicated by recurrent ascites and dysphagia.  He has required multiple paracentesis.  Started on nasogastric tube feedings. Not candidate for PEG tube placement due to recurrent ascites. Feedings had to be held for 2 days due to recurrent aspirations and tube clogging.  Now on nocturnal feeds. Palliative care consulted.  Legal guardianship is in process.  Poor prognosis. Patient remains full code.  Patient disposition continues to be complicated due to his need for DSS/Guardianship - meeting 10/11/22 preliminary hearing completed. Next meeting 10/24/22 to hopefully decide guardianship and further medical care planning. Once guardianship has been confirmed may need to discuss long term goals of care given he cannot have PEG tube (secondary to ascites).  Consultants: Palliative care.  Subjective/Interval History: Patient noted to be quite somnolent this morning.  Requested nursing staff to keep a close eye on him.  CBG noted to be 75.  Assessment/Plan:  Liver cirrhosis with anasarca/ascites/history of hepatitis C and alcohol abuse Per history, patient had generalized anasarca and worsening pedal edema since October. Presented with significant ascites and has required multiple paracentesis so far during this admission. Continue lasix, spironolactone Last paracentesis on 2/8  with drainage of 3L of clear yellow liquid. LFTs have been stable. Ultrasound showed cirrhotic appearance of liver. Patient with previous history of alcohol abuse and history of hepatitis C.  Please note that patient has another medical record MRN [id number]. Patient remains stable for the most part.  Abdomen is distended but not severely so.  Continue diuretics for now.  He may need another paracentesis in a few days.  Acute GI bleeding in the setting of liver cirrhosis Acute blood loss anemia, stable On 1/24, patient had an episode of hematochezia, hematemesis.  Tube feeding was stopped.  GI consultation was obtained.  Patient was started on Protonix.  GI did not feel that endoscopy was indicated. Had recurrence of hematochezia on 2 2/15.  No drop in hemoglobin noted.  Continue to monitor for now.   Oropharyngeal dysphagia Secondary to stroke. Was on NG tube feedings.  Seen by speech therapy.  Now on dysphagia 1 diet.   After discussions with speech therapy and dietitian NG tube was removed on 2/13.   Patient does have good meal intake but his fluid intake is less than adequate.  He is not cleared for thin liquids. Sodium continued to increase and so he was placed on D5 infusion. Speech therapy to reevaluate if he can be cleared for thin liquids.  If he is not cleared for thin liquids and if he continues to have recurrence of hypernatremia being off of IV fluids then NG tube may need to be replaced.  Acute CVA/Prior CVA with left-sided deficits MRI showed left acute CVA. Stroke workup completed.  Echo showed EF of 55 to 60% with no cardiac source of embolism A1c 4.5, LDL 74 Because of GI bleeding, patient is currently not on any antithrombotic or  anticoagulant. Statin not started because of underlying liver cirrhosis Current deficit include expressive aphasia and dysphagia.  Continues to have left-sided weakness though improved range of motion noted in the lower extremities.    Hypoglycemia, chronic Continues to have hypoglycemia in the morning despite increased PO intake (and previously despite overnight feeds) Patient is not on any insulin or diabetic medications that could cause his recurrent hypoglycemia.  His liver disease is likely contributing. Recommend late-night snack.  Discussed with nursing staff. Cortisol level is 12.7. CBGs have been stable for the most part.  Hypernatremia/normal anion gap metabolic acidosis Previously his sodium level had peaked at 158.  Improved after he was given D5 infusion along with free water down his NG tube. NG tube has been removed.  Sodium level is again increasing as discussed above.  Started on D5 infusion.   Sodium level has improved.  Will cut back on D5.    Acute kidney injury/hypokalemia Baseline creatinine around 1.   Presented with creatinine of 2.08, peaked at 2.12.  Likely has a new baseline now around 1.6-1.8.  Rising creatinine was likely due to poor free water intake.  Started on IV fluids with improvement in renal function.  Continue to monitor urine output.  Avoid nephrotoxic agents.  Potassium is normal.  Aspiration pneumonitis versus pneumonia Completed ceftriaxone.  Respiratory status is stable.   Acute hepatic encephalopathy, improved but not back to previous baseline Ammonia level was initially 99, peak at 133, improved with lactulose Mentation is stable.   Thrombocytopenia Secondary to liver cirrhosis.  Counts are stable.   COPD Without acute exacerbation  Continue bronchodilators   BPH Flomax   Impaired mobility PT evaluation obtained.  Return to SNF recommended   Goals of care Palliative care is following.  DSS is involved but no legal guardian yet. [facility] for guardianship [name], [phone]). On 1/29, palliative care reached out to DSS again.  They were told that petition was filed on 1/29 for guardianship.   Ethics was involved.  Remains full code as of  now.  Moderate calorie malnutrition Nutrition Problem: Moderate Malnutrition Etiology: chronic illness  DVT Prophylaxis: SCDs Code Status: Full code Family Communication: None available, no legal guardian Disposition Plan: SNF when medically stable.  Status is: Inpatient Remains inpatient appropriate because: Oropharyngeal dysphagia requiring NG tube feedings  Medications: Scheduled:  furosemide  40 mg Oral Daily   lactulose  10 g Oral BID   latanoprost  1 drop Both Eyes QHS   metoprolol tartrate  25 mg Oral BID   pantoprazole (PROTONIX) IV  40 mg Intravenous Q24H   tamsulosin  0.4 mg Oral Daily   Continuous:  dextrose 75 mL/hr at 10/21/22 0700    PRN:[DISCONTINUED] acetaminophen **OR** acetaminophen (TYLENOL) oral liquid 160 mg/5 mL **OR** acetaminophen, dextrose, Gerhardt's butt cream, ipratropium-albuterol, ondansetron (ZOFRAN) IV, senna-docusate  Antibiotics: Anti-infectives (From admission, onward)    Start     Dose/Rate Route Frequency Ordered Stop   10/02/22 1000  cefTRIAXone (ROCEPHIN) 1 g in sodium chloride 0.9 % 100 mL IVPB        1 g 200 mL/hr over 30 Minutes Intravenous Every 24 hours 10/02/22 0855 10/08/22 1311   09/10/22 1345  cefTRIAXone (ROCEPHIN) 1 g in sodium chloride 0.9 % 100 mL IVPB        1 g 200 mL/hr over 30 Minutes Intravenous  Once 09/10/22 1331 09/10/22 1605   09/10/22 1345  azithromycin (ZITHROMAX) 500 mg in sodium chloride 0.9 % 250 mL  IVPB        500 mg 250 mL/hr over 60 Minutes Intravenous  Once 09/10/22 1331 09/10/22 1605       Objective:  Vital Signs  Vitals:   10/20/22 2355 10/21/22 0404 10/21/22 0500 10/21/22 0735  BP: 128/82 124/80  115/63  Pulse: 75 65  63  Resp: 18 16  16   Temp: 98.6 F (37 C) 98.4 F (36.9 C)  98.6 F (37 C)  TempSrc: Oral Oral  Axillary  SpO2: 100% 100%  100%  Weight:   58.4 kg   Height:        Intake/Output Summary (Last 24 hours) at 10/21/2022 1016 Last data filed at 10/21/2022 0700 Gross per 24  hour  Intake 1634.6 ml  Output 300 ml  Net 1334.6 ml    Filed Weights   10/18/22 0500 10/20/22 0500 10/21/22 0500  Weight: 65.9 kg 55.8 kg 58.4 kg    General appearance: Somnolent.  Eyes open when his name is called but does not really respond much. Resp: Clear to auscultation bilaterally.  Normal effort Cardio: S1-S2 is normal regular.  No S3-S4.  No rubs murmurs or bruit GI: Abdomen is soft.  Nontender nondistended.  Bowel sounds are present normal.  No masses organomegaly Extremities: No edema.    Lab Results:  Data Reviewed: I have personally reviewed following labs and reports of the imaging studies  CBC: Recent Labs  Lab 10/15/22 0653 10/17/22 0314 10/18/22 1226 10/19/22 0714 10/20/22 0440  WBC 6.8 7.3 7.9 7.9 7.7  HGB 9.0* 9.6* 9.5* 9.3* 9.8*  HCT 26.1* 29.8* 29.1* 28.6* 31.1*  MCV 102.0* 106.0* 105.1* 106.7* 108.7*  PLT 80* 83* 85* 80* 81*     Basic Metabolic Panel: Recent Labs  Lab 10/17/22 0314 10/18/22 0635 10/19/22 0714 10/20/22 0440 10/21/22 0307  NA 143 149* 151* 150* 145  K 3.4* 4.3 4.0 3.8 3.9  CL 112* 119* 121* 117* 115*  CO2 22 17* 22 24 23   GLUCOSE 103* 83 76 104* 94  BUN 67* 62* 55* 44* 37*  CREATININE 1.66* 1.70* 1.80* 1.67* 1.56*  CALCIUM 8.0* 8.0* 8.0* 8.0* 7.7*     GFR: Estimated Creatinine Clearance: 40 mL/min (A) (by C-G formula based on SCr of 1.56 mg/dL (H)).   CBG: Recent Labs  Lab 10/20/22 1602 10/20/22 2038 10/20/22 2357 10/21/22 0406 10/21/22 0803  GLUCAP 77 139* 101* 93 75      Radiology Studies: No results found.     LOS: 40 days   Lafaye Mcelmurry [employer] on [email or web address]  10/21/2022, 10:16 AM

## 2022-10-22 DIAGNOSIS — R1312 Dysphagia, oropharyngeal phase: Secondary | ICD-10-CM | POA: Diagnosis not present

## 2022-10-22 DIAGNOSIS — K921 Melena: Secondary | ICD-10-CM

## 2022-10-22 DIAGNOSIS — E870 Hyperosmolality and hypernatremia: Secondary | ICD-10-CM | POA: Diagnosis not present

## 2022-10-22 DIAGNOSIS — I639 Cerebral infarction, unspecified: Secondary | ICD-10-CM | POA: Diagnosis not present

## 2022-10-22 LAB — GLUCOSE, CAPILLARY
Glucose-Capillary: 66 mg/dL — ABNORMAL LOW (ref 70–99)
Glucose-Capillary: 72 mg/dL (ref 70–99)
Glucose-Capillary: 124 mg/dL — ABNORMAL HIGH (ref 70–99)
Glucose-Capillary: 101 mg/dL — ABNORMAL HIGH (ref 70–99)
Glucose-Capillary: 205 mg/dL — ABNORMAL HIGH (ref 70–99)

## 2022-10-22 LAB — BASIC METABOLIC PANEL
Anion gap: 10 (ref 5–15)
BUN: 34 mg/dL — ABNORMAL HIGH (ref 8–23)
CO2: 21 mmol/L — ABNORMAL LOW (ref 22–32)
Calcium: 7.8 mg/dL — ABNORMAL LOW (ref 8.9–10.3)
Chloride: 112 mmol/L — ABNORMAL HIGH (ref 98–111)
Creatinine, Ser: 1.59 mg/dL — ABNORMAL HIGH (ref 0.61–1.24)
GFR, Estimated: 48 mL/min — ABNORMAL LOW (ref >60)
Glucose, Bld: 80 mg/dL (ref 70–99)
Potassium: 4.2 mmol/L (ref 3.5–5.1)
Sodium: 143 mmol/L (ref 135–145)

## 2022-10-22 LAB — HEMOGLOBIN AND HEMATOCRIT, BLOOD
HCT: 29.2 % — ABNORMAL LOW (ref 39.0–52.0)
Hemoglobin: 9.6 g/dL — ABNORMAL LOW (ref 13.0–17.0)

## 2022-10-22 LAB — CBC
HCT: 29.5 % — ABNORMAL LOW (ref 39.0–52.0)
Hemoglobin: 9.2 g/dL — ABNORMAL LOW (ref 13.0–17.0)
MCH: 34.7 pg — ABNORMAL HIGH (ref 26.0–34.0)
MCHC: 31.2 g/dL (ref 30.0–36.0)
MCV: 111.3 fL — ABNORMAL HIGH (ref 80.0–100.0)
Platelets: 77 10*3/uL — ABNORMAL LOW (ref 150–400)
RBC: 2.65 MIL/uL — ABNORMAL LOW (ref 4.22–5.81)
RDW: 17.3 % — ABNORMAL HIGH (ref 11.5–15.5)
WBC: 6.4 10*3/uL (ref 4.0–10.5)
nRBC: 0 % (ref 0.0–0.2)

## 2022-10-22 LAB — PROTIME-INR
INR: 1.4 — ABNORMAL HIGH (ref 0.8–1.2)
Prothrombin Time: 17.4 seconds — ABNORMAL HIGH (ref 11.4–15.2)

## 2022-10-22 MED ORDER — DEXTROSE-NACL 5-0.45 % IV SOLN: INTRAVENOUS | Status: DC

## 2022-10-22 MED ORDER — PANTOPRAZOLE SODIUM 40 MG PO TBEC
40.0000 mg | DELAYED_RELEASE_TABLET | Freq: Every day | ORAL | Status: DC
  Administered 2022-10-22 – 2022-10-26 (×5): 40 mg via ORAL
  Filled 2022-10-22 (×5): qty 1

## 2022-10-22 MED ORDER — PHYTONADIONE 5 MG PO TABS
10.0000 mg | ORAL_TABLET | Freq: Once | ORAL | Status: AC
  Administered 2022-10-22: 10 mg via ORAL
  Filled 2022-10-22: qty 2

## 2022-10-22 NOTE — Progress Notes (Signed)
PHARMACIST - PHYSICIAN COMMUNICATION  DR:   [place] Pink  CONCERNING: IV to Oral Route Change Policy  RECOMMENDATION: This patient is receiving Protonix by the intravenous route.  Based on criteria approved by the Pharmacy and Therapeutics Committee, the intravenous medication(s) is/are being converted to the equivalent oral dose form(s).   DESCRIPTION: These criteria include: The patient is eating (either orally or via tube) and/or has been taking other orally administered medications for a least 24 hours The patient has no evidence of active gastrointestinal bleeding or impaired GI absorption (gastrectomy, short bowel, patient on TNA or NPO).  If you have questions about this conversion, please contact the Pharmacy Department  []$   [phone] )  [facility] []$   [phone] )  [facility] [x]$   [phone] )  [facility] []$   [phone] )  [facility] []$   [phone] )  [place], PharmD, BCPS 10/22/2022 8:50 AM

## 2022-10-22 NOTE — Progress Notes (Signed)
Mobility Specialist Progress Note   10/22/22 1345  Mobility  Activity Transferred from bed to chair  Level of Assistance +2 (takes two people) (Max/TA)  Assistive Device Other (Comment) (Gait Belt + Arm Held (RUE))  Range of Motion/Exercises Active;All extremities  Activity Response Tolerated well   Patient received in recliner chair requesting assistance back to bed. Required +2 max/total assist to squat pivot from recliner to bed. Tolerated without complaint or incident. Was assisted into supine with +2 max A and left in supine with all needs met, RN present.   Jacob Bass, BS EXP Mobility Specialist Please contact via SecureChat or Rehab office at [phone]

## 2022-10-22 NOTE — Plan of Care (Signed)

## 2022-10-22 NOTE — Progress Notes (Signed)
[facility] PROGRESS NOTE   Beecher Alfonse Ras ZOX:[id number] DOB: 06/03/1961 DOA: 09/10/2022  PCP: Donia Ast., MD  Brief History/Interval Summary:  62 y.o. male with PMH significant for history of stroke with residual left-sided deficits, HTN, liver cirrhosis, chronic anemia, COPD, recent diagnosis of COVID who lives at [facility]. On 1/8, patient was sent to the ED for altered mental status and weakness.  Last known normal the previous night. MRI brain positive for small acute infarct. Because of stroke, patient has dysphagia, expressive aphasia and worsening of left-sided deficits.   His hospital course has been complicated by recurrent ascites and dysphagia.  He has required multiple paracentesis.  Started on nasogastric tube feedings. Not candidate for PEG tube placement due to recurrent ascites. Feedings had to be held for 2 days due to recurrent aspirations and tube clogging.  Now on nocturnal feeds. Palliative care consulted.  Legal guardianship is in process.  Poor prognosis. Patient remains full code.  Patient disposition continues to be complicated due to his need for DSS/Guardianship - meeting 10/11/22 preliminary hearing completed. Next meeting 10/24/22 to hopefully decide guardianship and further medical care planning. Once guardianship has been confirmed may need to discuss long term goals of care given he cannot have PEG tube (secondary to ascites).  Consultants: Palliative care.  Subjective/Interval History: Patient noted to be awake alert today.  Not communicating due to his aphasia.  Assessment/Plan:  Liver cirrhosis with anasarca/ascites/history of hepatitis C and alcohol abuse Per history, patient had generalized anasarca and worsening pedal edema since October. Presented with significant ascites and has required multiple paracentesis so far during this admission. Continue lasix, spironolactone Last paracentesis on 2/8 with drainage of 3L of clear yellow  liquid. LFTs have been stable. Ultrasound showed cirrhotic appearance of liver. Patient with previous history of alcohol abuse and history of hepatitis C.  Please note that patient has another medical record MRN [id number]. Patient remains stable.  Abdomen send not as distended as it has been previously.  Continue diuretics for now.  Continue to monitor for paracentesis needs.    Acute GI bleeding in the setting of liver cirrhosis Acute blood loss anemia, stable On 1/24, patient had an episode of hematochezia, hematemesis.  Tube feeding was stopped.  GI consultation was obtained.  Patient was started on Protonix.  GI did not feel that endoscopy was indicated. Patient had recurrence of hematochezia on 2/15 and then again last night.  No significant drop in hemoglobin noted.  Patient has not had colonoscopies previously.  No obvious hemorrhoids noted.  He has not had any CT images.  Will recheck his labs this afternoon.  Proceed with CT angiogram abdomen and pelvis.  Hydrate him since his creatinine is slightly elevated. Continue PPI for now.   Oropharyngeal dysphagia Secondary to stroke. Was on NG tube feedings.  Seen by speech therapy.  Now on dysphagia 1 diet.   After discussions with speech therapy and dietitian NG tube was removed on 2/13.   Patient does have good meal intake but his fluid intake is less than adequate.  He is not cleared for thin liquids. Sodium continued to increase and so he was placed on D5 infusion. Speech therapy to reevaluate if he can be cleared for thin liquids.  Situation could be  complicated if he is not cleared for thin liquids and continues to require IV fluids for recurrent hypernatremia.  Acute CVA/Prior CVA with left-sided deficits MRI showed left acute CVA. Stroke workup completed.  Echo showed  EF of 55 to 60% with no cardiac source of embolism A1c 4.5, LDL 74 Because of GI bleeding, patient is currently not on any antithrombotic or anticoagulant. Statin  not started because of underlying liver cirrhosis Current deficit include expressive aphasia and dysphagia.  Continues to have left-sided weakness though improved range of motion noted in the lower extremities.   Hypoglycemia, chronic Continues to have hypoglycemia in the morning despite increased PO intake (and previously despite overnight feeds) Patient is not on any insulin or diabetic medications that could cause his recurrent hypoglycemia.  His liver disease is likely contributing. Recommend late-night snack.  Discussed with nursing staff. Cortisol level is 12.7. CBGs have been stable for the most part.  Hypernatremia/normal anion gap metabolic acidosis Previously his sodium level had peaked at 158.  Improved after he was given D5 infusion along with free water down his NG tube. NG tube has been removed.  Sodium level continued to increase again so he was placed back on D5 infusion.  Sodium level has improved.  See discussion above.   Acute kidney injury/hypokalemia Baseline creatinine around 1.   Presented with creatinine of 2.08, peaked at 2.12.  Likely has a new baseline now around 1.6-1.8.  Rising creatinine was likely due to poor free water intake.  Started on IV fluids with improvement in renal function.  Continue to monitor urine output.  Avoid nephrotoxic agents.  Potassium is normal.  Aspiration pneumonitis versus pneumonia Completed ceftriaxone.  Respiratory status is stable.   Acute hepatic encephalopathy, improved but not back to previous baseline Ammonia level was initially 99, peak at 133, improved with lactulose Mentation is stable.   Thrombocytopenia Secondary to liver cirrhosis.  Will check CBC considering his episodes of hematochezia.   COPD Without acute exacerbation. Continue bronchodilators   BPH Flomax   Impaired mobility PT evaluation obtained.  Return to SNF recommended   Goals of care Palliative care is following.  DSS is involved but no legal  guardian yet. [facility] for guardianship [name], [phone]). On 1/29, palliative care reached out to DSS again.  They were told that petition was filed on 1/29 for guardianship.   Ethics was involved.  Remains full code as of now.  Moderate calorie malnutrition Nutrition Problem: Moderate Malnutrition Etiology: chronic illness  DVT Prophylaxis: SCDs Code Status: Full code Family Communication: None available, no legal guardian Disposition Plan: SNF when medically stable.  Status is: Inpatient Remains inpatient appropriate because: Oropharyngeal dysphagia requiring NG tube feedings  Medications: Scheduled:  furosemide  40 mg Oral Daily   lactulose  10 g Oral BID   latanoprost  1 drop Both Eyes QHS   metoprolol tartrate  25 mg Oral BID   pantoprazole  40 mg Oral Daily   tamsulosin  0.4 mg Oral Daily   Continuous:  dextrose 5 % and 0.45% NaCl      PRN:[DISCONTINUED] acetaminophen **OR** acetaminophen (TYLENOL) oral liquid 160 mg/5 mL **OR** acetaminophen, dextrose, Gerhardt's butt cream, ipratropium-albuterol, ondansetron (ZOFRAN) IV, senna-docusate  Antibiotics: Anti-infectives (From admission, onward)    Start     Dose/Rate Route Frequency Ordered Stop   10/02/22 1000  cefTRIAXone (ROCEPHIN) 1 g in sodium chloride 0.9 % 100 mL IVPB        1 g 200 mL/hr over 30 Minutes Intravenous Every 24 hours 10/02/22 0855 10/08/22 1311   09/10/22 1345  cefTRIAXone (ROCEPHIN) 1 g in sodium chloride 0.9 % 100 mL IVPB        1 g  200 mL/hr over 30 Minutes Intravenous  Once 09/10/22 1331 09/10/22 1605   09/10/22 1345  azithromycin (ZITHROMAX) 500 mg in sodium chloride 0.9 % 250 mL IVPB        500 mg 250 mL/hr over 60 Minutes Intravenous  Once 09/10/22 1331 09/10/22 1605       Objective:  Vital Signs  Vitals:   10/21/22 1523 10/21/22 2055 10/21/22 2336 10/22/22 0749  BP: 112/73 123/75 120/72 117/89  Pulse: 66 67 75 71  Resp: 14 16 17 16   Temp: 98.3 F  (36.8 C) (!) 97.5 F (36.4 C) 98.6 F (37 C) 98 F (36.7 C)  TempSrc: Axillary Oral Axillary   SpO2: 99% 98% 97% 100%  Weight:      Height:        Intake/Output Summary (Last 24 hours) at 10/22/2022 0915 Last data filed at 10/21/2022 1948 Gross per 24 hour  Intake 282.23 ml  Output 700 ml  Net -417.77 ml    Filed Weights   10/18/22 0500 10/20/22 0500 10/21/22 0500  Weight: 65.9 kg 55.8 kg 58.4 kg    General appearance: Awake alert.  In no distress Resp: Clear to auscultation bilaterally.  Normal effort Cardio: S1-S2 is normal regular.  No S3-S4.  No rubs murmurs or bruit GI: Abdomen is soft.  Nontender nondistended.  Bowel sounds are present normal.  No masses organomegaly Extremities: No edema.    Lab Results:  Data Reviewed: I have personally reviewed following labs and reports of the imaging studies  CBC: Recent Labs  Lab 10/17/22 0314 10/18/22 1226 10/19/22 0714 10/20/22 0440 10/21/22 2336  WBC 7.3 7.9 7.9 7.7  --   HGB 9.6* 9.5* 9.3* 9.8* 9.6*  HCT 29.8* 29.1* 28.6* 31.1* 29.2*  MCV 106.0* 105.1* 106.7* 108.7*  --   PLT 83* 85* 80* 81*  --      Basic Metabolic Panel: Recent Labs  Lab 10/18/22 0635 10/19/22 0714 10/20/22 0440 10/21/22 0307 10/22/22 0400  NA 149* 151* 150* 145 143  K 4.3 4.0 3.8 3.9 4.2  CL 119* 121* 117* 115* 112*  CO2 17* 22 24 23  21*  GLUCOSE 83 76 104* 94 80  BUN 62* 55* 44* 37* 34*  CREATININE 1.70* 1.80* 1.67* 1.56* 1.59*  CALCIUM 8.0* 8.0* 8.0* 7.7* 7.8*     GFR: Estimated Creatinine Clearance: 39.3 mL/min (A) (by C-G formula based on SCr of 1.59 mg/dL (H)).   CBG: Recent Labs  Lab 10/21/22 1209 10/21/22 1629 10/21/22 1941 10/22/22 0644 10/22/22 0712  GLUCAP 97 189* 77 66* 72      Radiology Studies: No results found.     LOS: 41 days   Jeramy Dimmick [employer] on [email or web address]  10/22/2022, 9:15 AM

## 2022-10-22 NOTE — Progress Notes (Signed)
Physical Therapy Treatment Patient Details Name: Jacob Bass MRN: [id number] DOB: September 25, 1960 Today's Date: 10/22/2022   History of Present Illness 62 y.o. male presents to [facility] hospital on 09/10/2022 with AMS and L weakness from [facility]. 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 Complicated by fluid overload. Paracentesis performed 1/14, 1/16, 1/21, 1/27. PMH includes CVA, COPD, cirrhosis, gastric ulcer.    PT Comments    Pt progressing slowly towards his physical therapy goals. No verbalizations today, however, is alert and following commands. Session focused on ROM, sitting balance, and transfer training .Pt performed squat pivot transfer towards right with two person maximal assist. Continue to recommend SNF for ongoing Physical Therapy.     Recommendations for follow up therapy are one component of a multi-disciplinary discharge planning process, led by the attending physician.  Recommendations may be updated based on patient status, additional functional criteria and insurance authorization.  Follow Up Recommendations  Skilled nursing-short term rehab (<3 hours/day) Can patient physically be transported by private vehicle: No   Assistance Recommended at Discharge Frequent or constant Supervision/Assistance  Patient can return home with the following Two people to help with walking and/or transfers;Two people to help with bathing/dressing/bathroom;Assistance with cooking/housework;Assistance with feeding;Direct supervision/assist for medications management;Direct supervision/assist for financial management;Assist for transportation;Help with stairs or ramp for entrance   Equipment Recommendations  Wheelchair (measurements PT);Wheelchair cushion (measurements PT);Hospital bed;Other (comment) (hoyer lift)    Recommendations for Other Services       Precautions / Restrictions Precautions Precautions: Fall Restrictions Weight Bearing Restrictions: No     Mobility  Bed  Mobility Overal bed mobility: Needs Assistance Bed Mobility: Rolling, Supine to Sit Rolling: Mod assist Sidelying to sit: Max assist, +2 for physical assistance       General bed mobility comments: Rolling to L/R with modA, initiating movement, assist for BLE's off edge of bed and trunk to upright. Pt pushing off of bed rail    Transfers Overall transfer level: Needs assistance Equipment used: None Transfers: Bed to chair/wheelchair/BSC       Squat pivot transfers: Max assist, +2 physical assistance     General transfer comment: MaxA + 2 for squat pivot towards right from bed to chair and additional partial stand from chair. Verbal cues for anterior weight shift    Ambulation/Gait                   Stairs             Wheelchair Mobility    Modified Rankin (Stroke Patients Only)       Balance Overall balance assessment: Needs assistance Sitting-balance support: Feet supported, No upper extremity supported Sitting balance-Leahy Scale: Poor Sitting balance - Comments: Statically, requiring supervision-min guard assist                                    Cognition Arousal/Alertness: Awake/alert Behavior During Therapy: Flat affect Overall Cognitive Status: No family/caregiver present to determine baseline cognitive functioning                                 General Comments: Not verbalizing, following commands        Exercises General Exercises - Lower Extremity Ankle Circles/Pumps: PROM, Both, 10 reps, Supine Heel Slides: PROM, Both, 10 reps, Supine    General Comments  Pertinent Vitals/Pain Pain Assessment Pain Assessment: Faces Faces Pain Scale: No hurt    Home Living                          Prior Function            PT Goals (current goals can now be found in the care plan section) Acute Rehab PT Goals Patient Stated Goal: not stated Time For Goal Achievement: 11/05/22 Potential  to Achieve Goals: Fair Progress towards PT goals: Progressing toward goals    Frequency    Min 2X/week      PT Plan Current plan remains appropriate    Co-evaluation              AM-PAC PT "6 Clicks" Mobility   Outcome Measure  Help needed turning from your back to your side while in a flat bed without using bedrails?: A Lot Help needed moving from lying on your back to sitting on the side of a flat bed without using bedrails?: Total Help needed moving to and from a bed to a chair (including a wheelchair)?: Total Help needed standing up from a chair using your arms (e.g., wheelchair or bedside chair)?: Total Help needed to walk in hospital room?: Total Help needed climbing 3-5 steps with a railing? : Total 6 Click Score: 7    End of Session Equipment Utilized During Treatment: Gait belt Activity Tolerance: Patient tolerated treatment well Patient left: in chair;with call bell/phone within reach;with chair alarm set Nurse Communication: Mobility status;Need for lift equipment PT Visit Diagnosis: Other abnormalities of gait and mobility (R26.89);Muscle weakness (generalized) (M62.81);Hemiplegia and hemiparesis Hemiplegia - Right/Left: Left Hemiplegia - dominant/non-dominant: Non-dominant Hemiplegia - caused by: Cerebral infarction     Time: 1002-1020 PT Time Calculation (min) (ACUTE ONLY): 18 min  Charges:  $Therapeutic Activity: 8-22 mins                     Lillia Pauls, PT, DPT Acute Rehabilitation Services Office [phone]    [name] 10/22/2022, 11:28 AM

## 2022-10-22 NOTE — Progress Notes (Addendum)
Hypoglycemic Event  CBG: 66  Treatment: 4 oz juice/soda  Symptoms: None  Follow-up CBG: Time: 0712 CBG Result: 72  Possible Reasons for Event: Inadequate meal intake  Comments/MD notified: Provider Maryland Pink aware    Jacob Bass

## 2022-10-23 ENCOUNTER — Inpatient Hospital Stay (HOSPITAL_COMMUNITY): Payer: Medicaid Other

## 2022-10-23 DIAGNOSIS — I639 Cerebral infarction, unspecified: Secondary | ICD-10-CM | POA: Diagnosis not present

## 2022-10-23 DIAGNOSIS — R1312 Dysphagia, oropharyngeal phase: Secondary | ICD-10-CM | POA: Diagnosis not present

## 2022-10-23 DIAGNOSIS — E870 Hyperosmolality and hypernatremia: Secondary | ICD-10-CM | POA: Diagnosis not present

## 2022-10-23 DIAGNOSIS — K921 Melena: Secondary | ICD-10-CM | POA: Diagnosis not present

## 2022-10-23 LAB — GLUCOSE, CAPILLARY
Glucose-Capillary: 104 mg/dL — ABNORMAL HIGH (ref 70–99)
Glucose-Capillary: 114 mg/dL — ABNORMAL HIGH (ref 70–99)
Glucose-Capillary: 55 mg/dL — ABNORMAL LOW (ref 70–99)
Glucose-Capillary: 87 mg/dL (ref 70–99)
Glucose-Capillary: 94 mg/dL (ref 70–99)
Glucose-Capillary: 97 mg/dL (ref 70–99)

## 2022-10-23 LAB — BASIC METABOLIC PANEL
Anion gap: 5 (ref 5–15)
BUN: 29 mg/dL — ABNORMAL HIGH (ref 8–23)
CO2: 24 mmol/L (ref 22–32)
Calcium: 7.4 mg/dL — ABNORMAL LOW (ref 8.9–10.3)
Chloride: 111 mmol/L (ref 98–111)
Creatinine, Ser: 1.51 mg/dL — ABNORMAL HIGH (ref 0.61–1.24)
GFR, Estimated: 52 mL/min — ABNORMAL LOW (ref >60)
Glucose, Bld: 95 mg/dL (ref 70–99)
Potassium: 3.9 mmol/L (ref 3.5–5.1)
Sodium: 140 mmol/L (ref 135–145)

## 2022-10-23 LAB — CBC
HCT: 26.2 % — ABNORMAL LOW (ref 39.0–52.0)
Hemoglobin: 8.6 g/dL — ABNORMAL LOW (ref 13.0–17.0)
MCH: 34.5 pg — ABNORMAL HIGH (ref 26.0–34.0)
MCHC: 32.8 g/dL (ref 30.0–36.0)
MCV: 105.2 fL — ABNORMAL HIGH (ref 80.0–100.0)
Platelets: 67 10*3/uL — ABNORMAL LOW (ref 150–400)
RBC: 2.49 MIL/uL — ABNORMAL LOW (ref 4.22–5.81)
RDW: 16.9 % — ABNORMAL HIGH (ref 11.5–15.5)
WBC: 7.3 10*3/uL (ref 4.0–10.5)
nRBC: 0 % (ref 0.0–0.2)

## 2022-10-23 MED ORDER — SODIUM CHLORIDE 0.9% FLUSH: 10.0000 mL | INTRAVENOUS | Status: DC | PRN

## 2022-10-23 MED ORDER — SODIUM CHLORIDE 0.9% FLUSH
10.0000 mL | Freq: Two times a day (BID) | INTRAVENOUS | Status: DC
  Administered 2022-10-23 – 2022-10-25 (×5): 10 mL
  Administered 2022-10-25: 20 mL
  Administered 2022-10-26: 10 mL

## 2022-10-23 MED ORDER — IOHEXOL 350 MG/ML SOLN
75.0000 mL | Freq: Once | INTRAVENOUS | Status: AC | PRN
  Administered 2022-10-23: 75 mL via INTRAVENOUS

## 2022-10-23 NOTE — Progress Notes (Signed)
   10/23/22 0750  Provider Notification  Provider Name/Title Dr. Maryland Pink - Attending  Date Provider Notified 10/23/22  Time Provider Notified [phone]  Method of Notification Face-to-face;Rounds  Notification Reason Critical Result  Test performed and critical result CBG 55  Date Critical Result Received 10/23/22  Time Critical Result Received 0750  Provider response At bedside;In department  Date of Provider Response 10/23/22  Time of Provider Response 0753    PRN dextrose given at this time for CBG of 55. Dr. Maryland Pink at bedside and notified.

## 2022-10-23 NOTE — Progress Notes (Signed)
Nutrition Follow-up  DOCUMENTATION CODES:  Non-severe (moderate) malnutrition in context of chronic illness  INTERVENTION:  Continue current diet as ordered per SLP, nursing staff to assist with feeding and tray set up Encourage PO fluids Magic cup TID with meals, each supplement provides 290 kcal and 9 grams of protein Mighty Shake TID, each supplement provides 330kcal and 9g protein  Monitor guardianship status  NUTRITION DIAGNOSIS:  Moderate Malnutrition related to chronic illness as evidenced by moderate fat depletion, severe muscle depletion. - remains applicable  GOAL:  Patient will meet greater than or equal to 90% of their needs - being met with TF at goal  MONITOR:  Diet advancement, Labs, I & O's, TF tolerance  REASON FOR ASSESSMENT:  Consult Assessment of nutrition requirement/status  ASSESSMENT:  Pt with hx of HTN, COPD, cirrhosis, hx of etOH abuse, dementia, and prior CVA presented to ED from his nursing facility with AMS and weakness. Found to have suffered an acute infarct.   1/9 - MBS, NPO 1/12 - cortrak placed 1/14 - paracentesis, 3.8L of clear yellow fluid removed 1/17 - MBS, NPO, SLP signed off on acute treatment, noted that oral phase of swallow is not functional, paracentesis, 5.8L removed 1/21 - paracentesis, 3.5L removed 1/23 - paracentesis, 3.4L removed  1/27 - paracentesis, 4.2L removed 1/30 - MBS, DYS 1 with honey think liquids 2/13 - cortrak removed 2/20 - repeat MBS, liquids advanced to thin liquids  Pt out of room for MBS at the time of assessment. Was able to have liquid texture advanced to thins. MD has been managing Na and hypoglycemia with dextrose containing IVF since cortrak was removed last week. PO intake of meals is likely meeting nutrition needs and as pt is now able to have a wider variety of liquids, will add additional supplements.   Pt does continue to have low glucose each morning, likely as a result of his advanced liver failure.    Guardianship process is still underway.   Average Meal Intake: 1/30-2/6: 54% intake x 11 recorded meals Per kcal count 2/2-2/5, pt meeting ~41% of kcal needs and 36% of protein needs 2/6-2/13: 61% x 16 recorded meals 2/14-2/20: 56% x 8 recorded meals  Nutritionally Relevant Medications: Scheduled Meds:  furosemide  40 mg Oral Daily   lactulose  10 g Oral BID   pantoprazole  40 mg Oral Daily   tamsulosin  0.4 mg Oral Daily   Continuous Infusions:  dextrose 5 % and 0.45% NaCl 100 mL/hr at 10/22/22 0926   PRN Meds: ondansetron, senna-docusate  Labs Reviewed  NUTRITION - FOCUSED PHYSICAL EXAM: Flowsheet Row Most Recent Value  Orbital Region Mild depletion  Upper Arm Region Moderate depletion  Thoracic and Lumbar Region Moderate depletion  Buccal Region Mild depletion  Temple Region Mild depletion  Clavicle Bone Region Mild depletion  Clavicle and Acromion Bone Region Severe depletion  Scapular Bone Region Moderate depletion  Dorsal Hand Severe depletion  Patellar Region No depletion  Anterior Thigh Region No depletion  Posterior Calf Region No depletion  Edema (RD Assessment) Moderate  [significant edema to the BLE]  Hair Reviewed  Eyes Reviewed  Mouth Reviewed  Skin Reviewed  Nails Reviewed    Diet Order:   Diet Order             DIET - DYS 1 Room service appropriate? No; Fluid consistency: Thin  Diet effective now  EDUCATION NEEDS:  Not appropriate for education at this time  Skin:  Skin Assessment: Reviewed RN Assessment  Last BM:  2/19 - type 6  Height:  Ht Readings from Last 1 Encounters:  09/10/22 5' 7"$  (1.702 m)    Weight:  Wt Readings from Last 1 Encounters:  10/21/22 58.4 kg    Ideal Body Weight:  67.3 kg  BMI:  Body mass index is 20.16 kg/m.  Estimated Nutritional Needs:  Kcal:  1900-2100 kcal/d Protein:  90-105g/d Fluid:  2L/d    Ranell Patrick, RD, LDN Clinical Dietitian RD pager # available in [place]   After hours/weekend pager # available in [facility]

## 2022-10-23 NOTE — Plan of Care (Signed)

## 2022-10-23 NOTE — Progress Notes (Signed)
Modified Barium Swallow Study  Patient Details  Name: Jacob Bass MRN: [id number] Date of Birth: 1961-05-31  Today's Date: 10/23/2022  Modified Barium Swallow completed.  Full report located under Chart Review in the Imaging Section.  History of Present Illness Jacob Bass is a 62 y.o. male who presented to ED from [facility] via EMS with left sided weakness, speech changes, wheezing. PMH/PSH includes prior CVA, subarachnoid hemorrhage, cirrhosis, hep C, hx of etOH abuse, dementia, COPD.  Pt known to SLP from prior admissions, with baseline dysarthria and oral dysphagia. He was most recently seen by SLP on 04/13/22 with recommendations for Dysphagia 1 solids and thin liquids. NPO with Cortrak, can't get PEG due to acities. Repeat MBS for possible liquid upgrade.   Clinical Impression Pt's oral phase of swallow has improved since [facility] 10/02/22. He continues with decreased bolus control however there was less escape to floor of mandible. Lingual residue present after all trials but volume was decreased from prior studies. Regular solid texture not given during study however therapist will work with pt at bedside to advance from puree. There was no aspiration however laryngeal penetration present with thin while using a straw. Decreased laryngeal elevation prevented early closure with barium entering vestibule prior to vestibule closure. Subsequent cup sips decreased velocity and prevented penetration consistently. Swallows were initiated at valleculae and several times in pyriform sinuses.Tongue base retraction is mildly reduced with mild valleculae and pyriform sinus residue with thin and nectar (no significant residue with puree or honey thick). Therapist recommends upgrade to thin liquids via CUP only, NO straws, sitting in upright position, small sips, cue to clear throat and pills crushed. Continue puree and SLP will work with pt in attempts to upgrade to chewable solids. Factors that may increase  risk of adverse event in presence of aspiration ([place] 2021): Reduced cognitive function;Frail or deconditioned  Swallow Evaluation Recommendations Recommendations: PO diet PO Diet Recommendation: Dysphagia 1 (Pureed);Thin liquids (Level 0) Liquid Administration via: Cup;No straw Medication Administration: Crushed with puree Supervision: Staff to assist with self-feeding;Full assist for feeding Swallowing strategies  : Slow rate;Small bites/sips;Clear throat intermittently Postural changes: Position pt fully upright for meals Oral care recommendations: Oral care BID (2x/day)      Houston Siren 10/23/2022,12:34 PM

## 2022-10-23 NOTE — Progress Notes (Signed)
[facility] PROGRESS NOTE   Jacob Bass MWN:[id number] DOB: 11-Mar-1961 DOA: 09/10/2022  PCP: Donia Ast., MD  Brief History/Interval Summary:  62 y.o. male with PMH significant for history of stroke with residual left-sided deficits, HTN, liver cirrhosis, chronic anemia, COPD, recent diagnosis of COVID who lives at [facility]. On 1/8, patient was sent to the ED for altered mental status and weakness.  Last known normal the previous night. MRI brain positive for small acute infarct. Because of stroke, patient has dysphagia, expressive aphasia and worsening of left-sided deficits.   His hospital course has been complicated by recurrent ascites and dysphagia.  He has required multiple paracentesis.  Started on nasogastric tube feedings. Not candidate for PEG tube placement due to recurrent ascites. Feedings had to be held for 2 days due to recurrent aspirations and tube clogging.  Now on nocturnal feeds. Palliative care consulted.  Legal guardianship is in process.  Poor prognosis. Patient remains full code.  Patient disposition continues to be complicated due to his need for DSS/Guardianship - meeting 10/11/22 preliminary hearing completed. Next meeting 10/24/22 to hopefully decide guardianship and further medical care planning. Once guardianship has been confirmed may need to discuss long term goals of care given he cannot have PEG tube (secondary to ascites).  Consultants: Palliative care.  Subjective/Interval History: Patient is awake alert.  Not communicative due to aphasia.    Assessment/Plan:  Liver cirrhosis with anasarca/ascites/history of hepatitis C and alcohol abuse Per history, patient had generalized anasarca and worsening pedal edema since October. Presented with significant ascites and has required multiple paracentesis so far during this admission. Continue lasix, spironolactone Last paracentesis on 2/8 with drainage of 3L of clear yellow liquid. LFTs have been  stable. Ultrasound showed cirrhotic appearance of liver. Patient with previous history of alcohol abuse and history of hepatitis C.  Please note that patient has another medical record MRN [id number]. Patient remains stable.  Abdomen not as distended as it has been previously.  Diuretics may be helping.  Continue diuretics for now.  Continue to monitor for paracentesis needs.   Given vitamin K yesterday.  INR was noted to be 1.4.  Acute GI bleeding in the setting of liver cirrhosis Acute blood loss anemia, stable On 1/24, patient had an episode of hematochezia, hematemesis.  Tube feeding was stopped.  GI consultation was obtained.  Patient was started on Protonix.  GI did not feel that endoscopy was indicated. Patient had recurrence of hematochezia on 2/15 and then again on 2/18.  No significant drop in hemoglobin noted.  Patient has not had colonoscopies previously.  No obvious hemorrhoids noted.  He has not had any CT images.  CT angiogram has been ordered.  Continue IV hydration.  Creatinine is stable. Continue PPI for now. Drop in hemoglobin is noted this morning but could be dilutional.  No overt bleeding has been noted.  Continue to monitor daily for now.   Oropharyngeal dysphagia Secondary to stroke. Was on NG tube feedings.  Seen by speech therapy.  Now on dysphagia 1 diet.   After discussions with speech therapy and dietitian NG tube was removed on 2/13.   Patient does have good meal intake but his fluid intake is less than adequate.  He is not cleared for thin liquids. Sodium continued to increase and so he was placed on D5 infusion. Speech therapy to reevaluate if he can be cleared for thin liquids.  Situation could be  complicated if he is not cleared for thin  liquids and continues to require IV fluids for recurrent hypernatremia.  Acute CVA/Prior CVA with left-sided deficits MRI showed left acute CVA. Stroke workup completed.  Echo showed EF of 55 to 60% with no cardiac source of  embolism A1c 4.5, LDL 74 Because of GI bleeding, patient is currently not on any antithrombotic or anticoagulant. Statin not started because of underlying liver cirrhosis Current deficit include expressive aphasia and dysphagia.  Continues to have left-sided weakness though improved range of motion noted in the lower extremities.   Hypoglycemia, chronic Continues to have hypoglycemia in the morning despite increased PO intake (and previously despite overnight feeds) Patient is not on any insulin or diabetic medications that could cause his recurrent hypoglycemia.  His liver disease is likely contributing. Recommend late-night snack.  Discussed with nursing staff. Cortisol level is 12.7. CBGs have been stable for the most part.  Hypernatremia/normal anion gap metabolic acidosis Previously his sodium level had peaked at 158.  Improved after he was given D5 infusion along with free water down his NG tube. NG tube has been removed.  Sodium level continued to increase again so he was placed back on D5 infusion.  Sodium level has improved.  See discussion above.   Acute kidney injury/hypokalemia Baseline creatinine around 1.   Presented with creatinine of 2.08, peaked at 2.12.  Likely has a new baseline now around 1.6-1.8.  Rising creatinine was likely due to poor free water intake.  Started on IV fluids with improvement in renal function.  Continue to monitor urine output.  Avoid nephrotoxic agents.  Potassium is normal.  Aspiration pneumonitis versus pneumonia Completed ceftriaxone.  Respiratory status is stable.   Acute hepatic encephalopathy, improved but not back to previous baseline Ammonia level was initially 99, peak at 133, improved with lactulose Mentation is stable.   Thrombocytopenia Secondary to liver cirrhosis.  Stable for the most part.  Continue to monitor.   COPD Without acute exacerbation. Continue bronchodilators   BPH Flomax   Impaired mobility PT evaluation  obtained.  Return to SNF recommended   Goals of care Palliative care is following.  DSS is involved but no legal guardian yet. [facility] for guardianship [name], [phone]). On 1/29, palliative care reached out to DSS again.  They were told that petition was filed on 1/29 for guardianship.   Ethics was involved.  Remains full code as of now.  Moderate calorie malnutrition Nutrition Problem: Moderate Malnutrition Etiology: chronic illness  DVT Prophylaxis: SCDs Code Status: Full code Family Communication: None available, no legal guardian Disposition Plan: SNF when medically stable.  Status is: Inpatient Remains inpatient appropriate because: Oropharyngeal dysphagia requiring NG tube feedings  Medications: Scheduled:  furosemide  40 mg Oral Daily   lactulose  10 g Oral BID   latanoprost  1 drop Both Eyes QHS   metoprolol tartrate  25 mg Oral BID   pantoprazole  40 mg Oral Daily   tamsulosin  0.4 mg Oral Daily   Continuous:  dextrose 5 % and 0.45% NaCl 100 mL/hr at 10/22/22 4259    PRN:[DISCONTINUED] acetaminophen **OR** acetaminophen (TYLENOL) oral liquid 160 mg/5 mL **OR** acetaminophen, dextrose, Gerhardt's butt cream, ipratropium-albuterol, ondansetron (ZOFRAN) IV, senna-docusate  Antibiotics: Anti-infectives (From admission, onward)    Start     Dose/Rate Route Frequency Ordered Stop   10/02/22 1000  cefTRIAXone (ROCEPHIN) 1 g in sodium chloride 0.9 % 100 mL IVPB        1 g 200 mL/hr over 30 Minutes Intravenous Every  24 hours 10/02/22 0855 10/08/22 1311   09/10/22 1345  cefTRIAXone (ROCEPHIN) 1 g in sodium chloride 0.9 % 100 mL IVPB        1 g 200 mL/hr over 30 Minutes Intravenous  Once 09/10/22 1331 09/10/22 1605   09/10/22 1345  azithromycin (ZITHROMAX) 500 mg in sodium chloride 0.9 % 250 mL IVPB        500 mg 250 mL/hr over 60 Minutes Intravenous  Once 09/10/22 1331 09/10/22 1605       Objective:  Vital Signs  Vitals:   10/22/22  1946 10/22/22 2323 10/23/22 0352 10/23/22 0738  BP: (!) 140/70 124/82 110/65 120/70  Pulse: 68 78 80 67  Resp:    20  Temp: 97.6 F (36.4 C) 97.7 F (36.5 C) 97.7 F (36.5 C) 98.2 F (36.8 C)  TempSrc: Oral Oral Oral Oral  SpO2: 92% 97% 100% 100%  Weight:      Height:        Intake/Output Summary (Last 24 hours) at 10/23/2022 0948 Last data filed at 10/22/2022 1700 Gross per 24 hour  Intake 720 ml  Output 600 ml  Net 120 ml    Filed Weights   10/18/22 0500 10/20/22 0500 10/21/22 0500  Weight: 65.9 kg 55.8 kg 58.4 kg    General appearance: Awake alert.  In no distress Resp: Clear to auscultation bilaterally.  Normal effort Cardio: S1-S2 is normal regular.  No S3-S4.  No rubs murmurs or bruit GI: Abdomen is soft.  Nontender nondistended.  Bowel sounds are present normal.  No masses organomegaly Extremities: No edema.     Lab Results:  Data Reviewed: I have personally reviewed following labs and reports of the imaging studies  CBC: Recent Labs  Lab 10/18/22 1226 10/19/22 0714 10/20/22 0440 10/21/22 2336 10/22/22 1230 10/23/22 0349  WBC 7.9 7.9 7.7  --  6.4 7.3  HGB 9.5* 9.3* 9.8* 9.6* 9.2* 8.6*  HCT 29.1* 28.6* 31.1* 29.2* 29.5* 26.2*  MCV 105.1* 106.7* 108.7*  --  111.3* 105.2*  PLT 85* 80* 81*  --  77* 67*     Basic Metabolic Panel: Recent Labs  Lab 10/19/22 0714 10/20/22 0440 10/21/22 0307 10/22/22 0400 10/23/22 0349  NA 151* 150* 145 143 140  K 4.0 3.8 3.9 4.2 3.9  CL 121* 117* 115* 112* 111  CO2 22 24 23  21* 24  GLUCOSE 76 104* 94 80 95  BUN 55* 44* 37* 34* 29*  CREATININE 1.80* 1.67* 1.56* 1.59* 1.51*  CALCIUM 8.0* 8.0* 7.7* 7.8* 7.4*     GFR: Estimated Creatinine Clearance: 41.4 mL/min (A) (by C-G formula based on SCr of 1.51 mg/dL (H)).   CBG: Recent Labs  Lab 10/22/22 1611 10/22/22 2151 10/23/22 0354 10/23/22 0740 10/23/22 0902  GLUCAP 101* 205* 87 55* 114*      Radiology Studies: No results found.     LOS: 42 days    Javanni Maring [employer] on [email or web address]  10/23/2022, 9:48 AM

## 2022-10-24 ENCOUNTER — Encounter (HOSPITAL_COMMUNITY): Payer: Self-pay | Admitting: Family Medicine

## 2022-10-24 ENCOUNTER — Inpatient Hospital Stay (HOSPITAL_COMMUNITY): Payer: Medicaid Other

## 2022-10-24 DIAGNOSIS — I639 Cerebral infarction, unspecified: Secondary | ICD-10-CM | POA: Diagnosis not present

## 2022-10-24 HISTORY — PX: IR PARACENTESIS: IMG2679

## 2022-10-24 LAB — GLUCOSE, CAPILLARY
Glucose-Capillary: 108 mg/dL — ABNORMAL HIGH (ref 70–99)
Glucose-Capillary: 110 mg/dL — ABNORMAL HIGH (ref 70–99)
Glucose-Capillary: 162 mg/dL — ABNORMAL HIGH (ref 70–99)
Glucose-Capillary: 76 mg/dL (ref 70–99)
Glucose-Capillary: 82 mg/dL (ref 70–99)

## 2022-10-24 LAB — CBC
HCT: 30.1 % — ABNORMAL LOW (ref 39.0–52.0)
Hemoglobin: 9.4 g/dL — ABNORMAL LOW (ref 13.0–17.0)
MCH: 34.1 pg — ABNORMAL HIGH (ref 26.0–34.0)
MCHC: 31.2 g/dL (ref 30.0–36.0)
MCV: 109.1 fL — ABNORMAL HIGH (ref 80.0–100.0)
Platelets: 77 10*3/uL — ABNORMAL LOW (ref 150–400)
RBC: 2.76 MIL/uL — ABNORMAL LOW (ref 4.22–5.81)
RDW: 17.1 % — ABNORMAL HIGH (ref 11.5–15.5)
WBC: 8.6 10*3/uL (ref 4.0–10.5)
nRBC: 0 % (ref 0.0–0.2)

## 2022-10-24 LAB — BASIC METABOLIC PANEL
Anion gap: 7 (ref 5–15)
BUN: 26 mg/dL — ABNORMAL HIGH (ref 8–23)
CO2: 23 mmol/L (ref 22–32)
Calcium: 7.5 mg/dL — ABNORMAL LOW (ref 8.9–10.3)
Chloride: 110 mmol/L (ref 98–111)
Creatinine, Ser: 1.44 mg/dL — ABNORMAL HIGH (ref 0.61–1.24)
GFR, Estimated: 55 mL/min — ABNORMAL LOW (ref >60)
Glucose, Bld: 89 mg/dL (ref 70–99)
Potassium: 3.9 mmol/L (ref 3.5–5.1)
Sodium: 140 mmol/L (ref 135–145)

## 2022-10-24 MED ORDER — LIDOCAINE HCL 1 % IJ SOLN
INTRAMUSCULAR | Status: AC
  Administered 2022-10-24: 10 mL
  Filled 2022-10-24: qty 20

## 2022-10-24 NOTE — Progress Notes (Signed)
stenosis. Renals: Single bilateral renal arteries are patent without evidence of aneurysm, dissection, vasculitis, fibromuscular dysplasia or significant stenosis. IMA: Patent without evidence of aneurysm, dissection, vasculitis or significant stenosis. Inflow: Patent without evidence of aneurysm, dissection, vasculitis or significant stenosis. Proximal Outflow: Bilateral common femoral and visualized portions of the superficial and profunda femoral arteries are patent without evidence of aneurysm, dissection, vasculitis or significant stenosis. Veins: The hepatic veins are widely patent. The portal system is widely patent and normal in caliber. No evidence of portosystemic shunt or ectopic varices. The renal veins are patent bilaterally in standard anatomic configuration. No evidence of iliocaval thrombosis or anomaly. Review of the MIP images confirms the above findings. NON-VASCULAR Lower chest: Partially visualized  small left pleural effusion with associated left basilar passive atelectasis. The heart is normal in size. No pericardial effusion. Coronary atherosclerotic calcifications. Hepatobiliary: Diffusely nodular contour. Diffusely shrunken size. No focal masses are visualized. The gallbladder is present and mildly distended without definite evidence of cholelithiasis or wall thickening. No intra or extrahepatic biliary ductal dilation. Pancreas: Unremarkable. No pancreatic ductal dilatation or surrounding inflammatory changes. Spleen: Normal in size without focal abnormality. Adrenals/Urinary Tract: Adrenal glands are unremarkable. Kidneys are normal, without renal calculi, focal lesion, or hydronephrosis. Bladder is unremarkable. Stomach/Bowel: Stomach is within normal limits. Unfortunately previously administered barium from the same day during swallow study opacifies the gastrointestinal tract from duodenum to the level of the splenic flexure. There is streak artifact from the barium which severely limits CT evaluation of the bowel, specifically for evidence of hemorrhage. No evidence of bowel wall thickening, distention, or inflammatory changes. Lymphatic: No abdominopelvic lymphadenopathy. Reproductive: Prostate is unremarkable. Other: Moderate ascites. Small fat, fluid, and bowel containing umbilical hernia without complicating features. Musculoskeletal: Advanced degenerative changes about the hips bilaterally, right greater than left. No acute osseous abnormality. IMPRESSION: 1. Severely limited evaluation for gastrointestinal hemorrhage due to recently ingested barium within the gastrointestinal tract spanning from the duodenum to the splenic flexure. Until barium clears from gastrointestinal tract, nuclear medicine tagged red blood cell scan may be more illustrative to localize etiology of hemorrhage, as clinically indicated. 2. Small left pleural effusion with associated left basilar passive atelectasis. 3.  Morphologic changes of hepatic cirrhosis with moderate ascites. 4. Coronary and aortic atherosclerosis (ICD10-I70.0). 5. Severe right hip degenerative change. Jacob Cancer, MD [facility] Electronically Signed   By: Jacob Bass M.D.   On: 10/23/2022 16:02   DG Swallowing Func-Speech Pathology  Result Date: 10/23/2022 Table formatting from the original result was not included. Modified Barium Swallow Study Patient Details Name: Jacob Bass MRN: [id number] Date of Birth: 1960/12/29 Today's Date: 10/23/2022 HPI/PMH: HPI: Jacob Bass is a 62 y.o. male who presented to ED from [facility] via EMS with left sided weakness, speech changes, wheezing. PMH/PSH includes prior CVA, subarachnoid hemorrhage, cirrhosis, hep C, hx of etOH abuse, dementia, COPD.  Pt known to SLP from prior admissions, with baseline dysarthria and oral dysphagia. He was most recently seen by SLP on 04/13/22 with recommendations for Dysphagia 1 solids and thin liquids. NPO with Cortrak, can't get PEG due to acities. Repeat MBS for possible liquid upgrade. Clinical Impression: Clinical Impression: Pt's oral phase of swallow has improved since [facility] 10/02/22. He continues with decreased bolus control however there was less escape to floor of mandible. Lingual residue present after all trials but volume was decreased from prior studies. Regular solid texture not given during study however therapist will work with pt  [facility] PROGRESS NOTE   Jacob Bass [id number] DOB: November 03, 1960 DOA: 09/10/2022  PCP: Jacob Bass., MD  Brief History/Interval Summary:  62 y.o. male with PMH significant for history of stroke with residual left-sided deficits, HTN, liver cirrhosis, chronic anemia, COPD, recent diagnosis of COVID who lives at [facility]. On 1/8, patient was sent to the ED for altered mental status and weakness.  Last known normal the previous night. MRI brain positive for small acute infarct. Because of stroke, patient has dysphagia, expressive aphasia and worsening of left-sided deficits.   His hospital course has been complicated by recurrent ascites and dysphagia.  He has required multiple paracentesis.  Started on nasogastric tube feedings. Not candidate for PEG tube placement due to recurrent ascites. Feedings had to be held for 2 days due to recurrent aspirations and tube clogging.  Now on nocturnal feeds. Palliative care consulted.  Legal guardianship is in process.  Poor prognosis. Patient remains full code.  Patient disposition continues to be complicated due to his need for DSS/Guardianship - meeting 10/11/22 preliminary hearing completed. Next meeting 10/24/22 to hopefully decide guardianship and further medical care planning. Once guardianship has been confirmed may need to discuss long term goals of care given he cannot have PEG tube (secondary to ascites).  Consultants: Palliative care.  Subjective/Interval History: Patient is awake alert.  Not communicative due to aphasia.    Assessment/Plan:  Liver cirrhosis with anasarca/ascites/history of hepatitis C and alcohol abuse Per history, patient had generalized anasarca and worsening pedal edema since October. Presented with significant ascites and has required multiple paracentesis so far during this admission. Continue lasix, spironolactone Last paracentesis on 2/8 with drainage of 3L of clear yellow liquid. Interventional  radiology requested to follow-up for possible repeat paracentesis given abdominal distention today LFTs have been stable. Ultrasound showed cirrhotic appearance of liver. Patient with previous history of alcohol abuse and history of hepatitis C.  Please note that patient has another medical record MRN [id number]. Given vitamin K previously - last INR was noted to be 1.4.  Transient hematochezia in the setting of liver cirrhosis Acute GI bleed less likely Acute blood loss anemia on chronic anemia of chronic disease, stable On 1/24, patient had an episode of hematochezia, hematemesis.  Tube feeding was stopped.  GI consultation was obtained.  Patient was started on Protonix.  GI did not feel that endoscopy was indicated at that time Patient had recurrence of hematochezia on 2/15 and then again on 2/18 with no significant drop in hemoglobin, likely transient hemorrhoidal bleed - no further intervention/evaluation at this time. CT abd angiogram limited evaluation due to retained barium Continue PPI.  Acute kidney injury/hypokalemia Hypovolemic hyponatremia Sodium corrected previously with IVF - follow oral intake today Previous baseline creatinine around 1.   Presented with creatinine of 2.08, peaked at 2.12.  Likely has a new baseline now around 1.5-1.6  Oropharyngeal dysphagia Secondary to stroke. Was on NG tube feedings.  Seen by speech therapy.  Now on dysphagia 1 diet.   After discussions with speech therapy and dietitian NG tube was removed on 2/13.   Patient does have good meal intake but his fluid intake is less than adequate.  He is not cleared for thin liquids. Sodium continued to increase and so he was placed on D5 infusion. Speech therapy to reevaluate if he can be cleared for thin liquids.  Situation could be  complicated if he is not cleared for thin liquids and continues to require IV fluids for  [facility] PROGRESS NOTE   Jacob Bass [id number] DOB: November 03, 1960 DOA: 09/10/2022  PCP: Jacob Bass., MD  Brief History/Interval Summary:  62 y.o. male with PMH significant for history of stroke with residual left-sided deficits, HTN, liver cirrhosis, chronic anemia, COPD, recent diagnosis of COVID who lives at [facility]. On 1/8, patient was sent to the ED for altered mental status and weakness.  Last known normal the previous night. MRI brain positive for small acute infarct. Because of stroke, patient has dysphagia, expressive aphasia and worsening of left-sided deficits.   His hospital course has been complicated by recurrent ascites and dysphagia.  He has required multiple paracentesis.  Started on nasogastric tube feedings. Not candidate for PEG tube placement due to recurrent ascites. Feedings had to be held for 2 days due to recurrent aspirations and tube clogging.  Now on nocturnal feeds. Palliative care consulted.  Legal guardianship is in process.  Poor prognosis. Patient remains full code.  Patient disposition continues to be complicated due to his need for DSS/Guardianship - meeting 10/11/22 preliminary hearing completed. Next meeting 10/24/22 to hopefully decide guardianship and further medical care planning. Once guardianship has been confirmed may need to discuss long term goals of care given he cannot have PEG tube (secondary to ascites).  Consultants: Palliative care.  Subjective/Interval History: Patient is awake alert.  Not communicative due to aphasia.    Assessment/Plan:  Liver cirrhosis with anasarca/ascites/history of hepatitis C and alcohol abuse Per history, patient had generalized anasarca and worsening pedal edema since October. Presented with significant ascites and has required multiple paracentesis so far during this admission. Continue lasix, spironolactone Last paracentesis on 2/8 with drainage of 3L of clear yellow liquid. Interventional  radiology requested to follow-up for possible repeat paracentesis given abdominal distention today LFTs have been stable. Ultrasound showed cirrhotic appearance of liver. Patient with previous history of alcohol abuse and history of hepatitis C.  Please note that patient has another medical record MRN [id number]. Given vitamin K previously - last INR was noted to be 1.4.  Transient hematochezia in the setting of liver cirrhosis Acute GI bleed less likely Acute blood loss anemia on chronic anemia of chronic disease, stable On 1/24, patient had an episode of hematochezia, hematemesis.  Tube feeding was stopped.  GI consultation was obtained.  Patient was started on Protonix.  GI did not feel that endoscopy was indicated at that time Patient had recurrence of hematochezia on 2/15 and then again on 2/18 with no significant drop in hemoglobin, likely transient hemorrhoidal bleed - no further intervention/evaluation at this time. CT abd angiogram limited evaluation due to retained barium Continue PPI.  Acute kidney injury/hypokalemia Hypovolemic hyponatremia Sodium corrected previously with IVF - follow oral intake today Previous baseline creatinine around 1.   Presented with creatinine of 2.08, peaked at 2.12.  Likely has a new baseline now around 1.5-1.6  Oropharyngeal dysphagia Secondary to stroke. Was on NG tube feedings.  Seen by speech therapy.  Now on dysphagia 1 diet.   After discussions with speech therapy and dietitian NG tube was removed on 2/13.   Patient does have good meal intake but his fluid intake is less than adequate.  He is not cleared for thin liquids. Sodium continued to increase and so he was placed on D5 infusion. Speech therapy to reevaluate if he can be cleared for thin liquids.  Situation could be  complicated if he is not cleared for thin liquids and continues to require IV fluids for  [facility] PROGRESS NOTE   Jacob Bass [id number] DOB: November 03, 1960 DOA: 09/10/2022  PCP: Jacob Bass., MD  Brief History/Interval Summary:  62 y.o. male with PMH significant for history of stroke with residual left-sided deficits, HTN, liver cirrhosis, chronic anemia, COPD, recent diagnosis of COVID who lives at [facility]. On 1/8, patient was sent to the ED for altered mental status and weakness.  Last known normal the previous night. MRI brain positive for small acute infarct. Because of stroke, patient has dysphagia, expressive aphasia and worsening of left-sided deficits.   His hospital course has been complicated by recurrent ascites and dysphagia.  He has required multiple paracentesis.  Started on nasogastric tube feedings. Not candidate for PEG tube placement due to recurrent ascites. Feedings had to be held for 2 days due to recurrent aspirations and tube clogging.  Now on nocturnal feeds. Palliative care consulted.  Legal guardianship is in process.  Poor prognosis. Patient remains full code.  Patient disposition continues to be complicated due to his need for DSS/Guardianship - meeting 10/11/22 preliminary hearing completed. Next meeting 10/24/22 to hopefully decide guardianship and further medical care planning. Once guardianship has been confirmed may need to discuss long term goals of care given he cannot have PEG tube (secondary to ascites).  Consultants: Palliative care.  Subjective/Interval History: Patient is awake alert.  Not communicative due to aphasia.    Assessment/Plan:  Liver cirrhosis with anasarca/ascites/history of hepatitis C and alcohol abuse Per history, patient had generalized anasarca and worsening pedal edema since October. Presented with significant ascites and has required multiple paracentesis so far during this admission. Continue lasix, spironolactone Last paracentesis on 2/8 with drainage of 3L of clear yellow liquid. Interventional  radiology requested to follow-up for possible repeat paracentesis given abdominal distention today LFTs have been stable. Ultrasound showed cirrhotic appearance of liver. Patient with previous history of alcohol abuse and history of hepatitis C.  Please note that patient has another medical record MRN [id number]. Given vitamin K previously - last INR was noted to be 1.4.  Transient hematochezia in the setting of liver cirrhosis Acute GI bleed less likely Acute blood loss anemia on chronic anemia of chronic disease, stable On 1/24, patient had an episode of hematochezia, hematemesis.  Tube feeding was stopped.  GI consultation was obtained.  Patient was started on Protonix.  GI did not feel that endoscopy was indicated at that time Patient had recurrence of hematochezia on 2/15 and then again on 2/18 with no significant drop in hemoglobin, likely transient hemorrhoidal bleed - no further intervention/evaluation at this time. CT abd angiogram limited evaluation due to retained barium Continue PPI.  Acute kidney injury/hypokalemia Hypovolemic hyponatremia Sodium corrected previously with IVF - follow oral intake today Previous baseline creatinine around 1.   Presented with creatinine of 2.08, peaked at 2.12.  Likely has a new baseline now around 1.5-1.6  Oropharyngeal dysphagia Secondary to stroke. Was on NG tube feedings.  Seen by speech therapy.  Now on dysphagia 1 diet.   After discussions with speech therapy and dietitian NG tube was removed on 2/13.   Patient does have good meal intake but his fluid intake is less than adequate.  He is not cleared for thin liquids. Sodium continued to increase and so he was placed on D5 infusion. Speech therapy to reevaluate if he can be cleared for thin liquids.  Situation could be  complicated if he is not cleared for thin liquids and continues to require IV fluids for  stenosis. Renals: Single bilateral renal arteries are patent without evidence of aneurysm, dissection, vasculitis, fibromuscular dysplasia or significant stenosis. IMA: Patent without evidence of aneurysm, dissection, vasculitis or significant stenosis. Inflow: Patent without evidence of aneurysm, dissection, vasculitis or significant stenosis. Proximal Outflow: Bilateral common femoral and visualized portions of the superficial and profunda femoral arteries are patent without evidence of aneurysm, dissection, vasculitis or significant stenosis. Veins: The hepatic veins are widely patent. The portal system is widely patent and normal in caliber. No evidence of portosystemic shunt or ectopic varices. The renal veins are patent bilaterally in standard anatomic configuration. No evidence of iliocaval thrombosis or anomaly. Review of the MIP images confirms the above findings. NON-VASCULAR Lower chest: Partially visualized  small left pleural effusion with associated left basilar passive atelectasis. The heart is normal in size. No pericardial effusion. Coronary atherosclerotic calcifications. Hepatobiliary: Diffusely nodular contour. Diffusely shrunken size. No focal masses are visualized. The gallbladder is present and mildly distended without definite evidence of cholelithiasis or wall thickening. No intra or extrahepatic biliary ductal dilation. Pancreas: Unremarkable. No pancreatic ductal dilatation or surrounding inflammatory changes. Spleen: Normal in size without focal abnormality. Adrenals/Urinary Tract: Adrenal glands are unremarkable. Kidneys are normal, without renal calculi, focal lesion, or hydronephrosis. Bladder is unremarkable. Stomach/Bowel: Stomach is within normal limits. Unfortunately previously administered barium from the same day during swallow study opacifies the gastrointestinal tract from duodenum to the level of the splenic flexure. There is streak artifact from the barium which severely limits CT evaluation of the bowel, specifically for evidence of hemorrhage. No evidence of bowel wall thickening, distention, or inflammatory changes. Lymphatic: No abdominopelvic lymphadenopathy. Reproductive: Prostate is unremarkable. Other: Moderate ascites. Small fat, fluid, and bowel containing umbilical hernia without complicating features. Musculoskeletal: Advanced degenerative changes about the hips bilaterally, right greater than left. No acute osseous abnormality. IMPRESSION: 1. Severely limited evaluation for gastrointestinal hemorrhage due to recently ingested barium within the gastrointestinal tract spanning from the duodenum to the splenic flexure. Until barium clears from gastrointestinal tract, nuclear medicine tagged red blood cell scan may be more illustrative to localize etiology of hemorrhage, as clinically indicated. 2. Small left pleural effusion with associated left basilar passive atelectasis. 3.  Morphologic changes of hepatic cirrhosis with moderate ascites. 4. Coronary and aortic atherosclerosis (ICD10-I70.0). 5. Severe right hip degenerative change. Jacob Cancer, MD [facility] Electronically Signed   By: Jacob Bass M.D.   On: 10/23/2022 16:02   DG Swallowing Func-Speech Pathology  Result Date: 10/23/2022 Table formatting from the original result was not included. Modified Barium Swallow Study Patient Details Name: Jacob Bass MRN: [id number] Date of Birth: 1960/12/29 Today's Date: 10/23/2022 HPI/PMH: HPI: Jacob Bass is a 62 y.o. male who presented to ED from [facility] via EMS with left sided weakness, speech changes, wheezing. PMH/PSH includes prior CVA, subarachnoid hemorrhage, cirrhosis, hep C, hx of etOH abuse, dementia, COPD.  Pt known to SLP from prior admissions, with baseline dysarthria and oral dysphagia. He was most recently seen by SLP on 04/13/22 with recommendations for Dysphagia 1 solids and thin liquids. NPO with Cortrak, can't get PEG due to acities. Repeat MBS for possible liquid upgrade. Clinical Impression: Clinical Impression: Pt's oral phase of swallow has improved since [facility] 10/02/22. He continues with decreased bolus control however there was less escape to floor of mandible. Lingual residue present after all trials but volume was decreased from prior studies. Regular solid texture not given during study however therapist will work with pt  stenosis. Renals: Single bilateral renal arteries are patent without evidence of aneurysm, dissection, vasculitis, fibromuscular dysplasia or significant stenosis. IMA: Patent without evidence of aneurysm, dissection, vasculitis or significant stenosis. Inflow: Patent without evidence of aneurysm, dissection, vasculitis or significant stenosis. Proximal Outflow: Bilateral common femoral and visualized portions of the superficial and profunda femoral arteries are patent without evidence of aneurysm, dissection, vasculitis or significant stenosis. Veins: The hepatic veins are widely patent. The portal system is widely patent and normal in caliber. No evidence of portosystemic shunt or ectopic varices. The renal veins are patent bilaterally in standard anatomic configuration. No evidence of iliocaval thrombosis or anomaly. Review of the MIP images confirms the above findings. NON-VASCULAR Lower chest: Partially visualized  small left pleural effusion with associated left basilar passive atelectasis. The heart is normal in size. No pericardial effusion. Coronary atherosclerotic calcifications. Hepatobiliary: Diffusely nodular contour. Diffusely shrunken size. No focal masses are visualized. The gallbladder is present and mildly distended without definite evidence of cholelithiasis or wall thickening. No intra or extrahepatic biliary ductal dilation. Pancreas: Unremarkable. No pancreatic ductal dilatation or surrounding inflammatory changes. Spleen: Normal in size without focal abnormality. Adrenals/Urinary Tract: Adrenal glands are unremarkable. Kidneys are normal, without renal calculi, focal lesion, or hydronephrosis. Bladder is unremarkable. Stomach/Bowel: Stomach is within normal limits. Unfortunately previously administered barium from the same day during swallow study opacifies the gastrointestinal tract from duodenum to the level of the splenic flexure. There is streak artifact from the barium which severely limits CT evaluation of the bowel, specifically for evidence of hemorrhage. No evidence of bowel wall thickening, distention, or inflammatory changes. Lymphatic: No abdominopelvic lymphadenopathy. Reproductive: Prostate is unremarkable. Other: Moderate ascites. Small fat, fluid, and bowel containing umbilical hernia without complicating features. Musculoskeletal: Advanced degenerative changes about the hips bilaterally, right greater than left. No acute osseous abnormality. IMPRESSION: 1. Severely limited evaluation for gastrointestinal hemorrhage due to recently ingested barium within the gastrointestinal tract spanning from the duodenum to the splenic flexure. Until barium clears from gastrointestinal tract, nuclear medicine tagged red blood cell scan may be more illustrative to localize etiology of hemorrhage, as clinically indicated. 2. Small left pleural effusion with associated left basilar passive atelectasis. 3.  Morphologic changes of hepatic cirrhosis with moderate ascites. 4. Coronary and aortic atherosclerosis (ICD10-I70.0). 5. Severe right hip degenerative change. Jacob Cancer, MD [facility] Electronically Signed   By: Jacob Bass M.D.   On: 10/23/2022 16:02   DG Swallowing Func-Speech Pathology  Result Date: 10/23/2022 Table formatting from the original result was not included. Modified Barium Swallow Study Patient Details Name: Jacob Bass MRN: [id number] Date of Birth: 1960/12/29 Today's Date: 10/23/2022 HPI/PMH: HPI: Jacob Bass is a 62 y.o. male who presented to ED from [facility] via EMS with left sided weakness, speech changes, wheezing. PMH/PSH includes prior CVA, subarachnoid hemorrhage, cirrhosis, hep C, hx of etOH abuse, dementia, COPD.  Pt known to SLP from prior admissions, with baseline dysarthria and oral dysphagia. He was most recently seen by SLP on 04/13/22 with recommendations for Dysphagia 1 solids and thin liquids. NPO with Cortrak, can't get PEG due to acities. Repeat MBS for possible liquid upgrade. Clinical Impression: Clinical Impression: Pt's oral phase of swallow has improved since [facility] 10/02/22. He continues with decreased bolus control however there was less escape to floor of mandible. Lingual residue present after all trials but volume was decreased from prior studies. Regular solid texture not given during study however therapist will work with pt  [facility] PROGRESS NOTE   Jacob Bass [id number] DOB: November 03, 1960 DOA: 09/10/2022  PCP: Jacob Bass., MD  Brief History/Interval Summary:  62 y.o. male with PMH significant for history of stroke with residual left-sided deficits, HTN, liver cirrhosis, chronic anemia, COPD, recent diagnosis of COVID who lives at [facility]. On 1/8, patient was sent to the ED for altered mental status and weakness.  Last known normal the previous night. MRI brain positive for small acute infarct. Because of stroke, patient has dysphagia, expressive aphasia and worsening of left-sided deficits.   His hospital course has been complicated by recurrent ascites and dysphagia.  He has required multiple paracentesis.  Started on nasogastric tube feedings. Not candidate for PEG tube placement due to recurrent ascites. Feedings had to be held for 2 days due to recurrent aspirations and tube clogging.  Now on nocturnal feeds. Palliative care consulted.  Legal guardianship is in process.  Poor prognosis. Patient remains full code.  Patient disposition continues to be complicated due to his need for DSS/Guardianship - meeting 10/11/22 preliminary hearing completed. Next meeting 10/24/22 to hopefully decide guardianship and further medical care planning. Once guardianship has been confirmed may need to discuss long term goals of care given he cannot have PEG tube (secondary to ascites).  Consultants: Palliative care.  Subjective/Interval History: Patient is awake alert.  Not communicative due to aphasia.    Assessment/Plan:  Liver cirrhosis with anasarca/ascites/history of hepatitis C and alcohol abuse Per history, patient had generalized anasarca and worsening pedal edema since October. Presented with significant ascites and has required multiple paracentesis so far during this admission. Continue lasix, spironolactone Last paracentesis on 2/8 with drainage of 3L of clear yellow liquid. Interventional  radiology requested to follow-up for possible repeat paracentesis given abdominal distention today LFTs have been stable. Ultrasound showed cirrhotic appearance of liver. Patient with previous history of alcohol abuse and history of hepatitis C.  Please note that patient has another medical record MRN [id number]. Given vitamin K previously - last INR was noted to be 1.4.  Transient hematochezia in the setting of liver cirrhosis Acute GI bleed less likely Acute blood loss anemia on chronic anemia of chronic disease, stable On 1/24, patient had an episode of hematochezia, hematemesis.  Tube feeding was stopped.  GI consultation was obtained.  Patient was started on Protonix.  GI did not feel that endoscopy was indicated at that time Patient had recurrence of hematochezia on 2/15 and then again on 2/18 with no significant drop in hemoglobin, likely transient hemorrhoidal bleed - no further intervention/evaluation at this time. CT abd angiogram limited evaluation due to retained barium Continue PPI.  Acute kidney injury/hypokalemia Hypovolemic hyponatremia Sodium corrected previously with IVF - follow oral intake today Previous baseline creatinine around 1.   Presented with creatinine of 2.08, peaked at 2.12.  Likely has a new baseline now around 1.5-1.6  Oropharyngeal dysphagia Secondary to stroke. Was on NG tube feedings.  Seen by speech therapy.  Now on dysphagia 1 diet.   After discussions with speech therapy and dietitian NG tube was removed on 2/13.   Patient does have good meal intake but his fluid intake is less than adequate.  He is not cleared for thin liquids. Sodium continued to increase and so he was placed on D5 infusion. Speech therapy to reevaluate if he can be cleared for thin liquids.  Situation could be  complicated if he is not cleared for thin liquids and continues to require IV fluids for

## 2022-10-24 NOTE — Progress Notes (Signed)
Mobility Specialist: Progress Note   10/24/22 1448  Mobility  Activity Stood at bedside  Level of Assistance +2 (takes two people)  Assistive Device Other (Comment) (HHA)  Activity Response Tolerated fair  Mobility Referral Yes  $Mobility charge 1 Mobility   Pt received in the bed and agreeable to mobility. ModA with bed mobility. Sat EOB for 5 minutes to practice weight shifting d/t posterior lean. Pt able to sit EOB with minA with cues after weight shifting. +2 maxA to stand EOB for 10 seconds. Pt assisted back to bed after session with call bell and phone at his side. Bed alarm is on.   Kenyon Raeden Schippers Mobility Specialist Please contact via SecureChat or Rehab office at [phone]

## 2022-10-24 NOTE — Progress Notes (Signed)
Occupational Therapy Treatment Patient Details Name: Jacob Bass MRN: [id number] DOB: May 26, 1961 Today's Date: 10/24/2022   History of present illness 62 y.o. male presents to [facility] hospital on 09/10/2022 with AMS and L weakness from [facility]. 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 Complicated by fluid overload. Paracentesis performed 1/14, 1/16, 1/21, 1/27. PMH includes CVA, COPD, cirrhosis, gastric ulcer.   OT comments  Pt with inconsistent progress towards OT goals with waxing/waning of functional abilities - goals downgraded accordingly. Pt alert, delayed response time and inconsistent following of commands. Assessed basic grooming tasks and self feeding bed level in chair position. Pt with impaired initiation and decreased appetite, requiring Total A for all tasks assessed. Due to poor progress, reduced OT frequency to 1x/wk. If no progress noted with next goal assessment, will consider DC from acute OT services.   Recommendations for follow up therapy are one component of a multi-disciplinary discharge planning process, led by the attending physician.  Recommendations may be updated based on patient status, additional functional criteria and insurance authorization.    Follow Up Recommendations  Skilled nursing-short term rehab (<3 hours/day)     Assistance Recommended at Discharge Frequent or constant Supervision/Assistance  Patient can return home with the following  Two people to help with walking and/or transfers;Two people to help with bathing/dressing/bathroom   Equipment Recommendations  None recommended by OT    Recommendations for Other Services      Precautions / Restrictions Precautions Precautions: Fall;Other (comment) Precaution Comments: baseline L hemiparesis, incontinent Restrictions Weight Bearing Restrictions: No       Mobility Bed Mobility Overal bed mobility: Needs Assistance             General bed mobility comments: Total A to scoot up and  reposition in bed for UB ADLs    Transfers                         Balance                                           ADL either performed or assessed with clinical judgement   ADL Overall ADL's : Needs assistance/impaired Eating/Feeding: Total assistance;Bed level Eating/Feeding Details (indicate cue type and reason): no initiation of tasks, often declined drink/food when offered. required assist to bring food to mouth, only ate one bite of puree pancakes and declined further breakfast. Per NT, present at start of session, pt has been improving and assisting with feeding self when NT has had him. Grooming: Total assistance;Bed level;Oral care;Wash/dry face Grooming Details (indicate cue type and reason): Total A, no initation. assist for mouthwash swab on toothette, wash face, etc                                    Extremity/Trunk Assessment Upper Extremity Assessment Upper Extremity Assessment: LUE deficits/detail LUE Deficits / Details: profound weakness w/ hx of CVA. fluctuating tone in hands though some tone with elbow ROM; unable to perform full elbow extension (lacking ~15 degrees). LUE Coordination: decreased fine motor;decreased gross motor   Lower Extremity Assessment Lower Extremity Assessment: Defer to PT evaluation        Vision   Vision Assessment?: No apparent visual deficits   Perception     Praxis  Cognition Arousal/Alertness: Awake/alert Behavior During Therapy: Flat affect Overall Cognitive Status: No family/caregiver present to determine baseline cognitive functioning                                 General Comments: inconsistent command following, delayed responses and sometimes did not respond at all to therapist's questions. did laugh consistently during session        Exercises      Shoulder Instructions       General Comments      Pertinent Vitals/ Pain       Pain  Assessment Pain Assessment: Faces Faces Pain Scale: No hurt Pain Intervention(s): Monitored during session  Home Living                                          Prior Functioning/Environment              Frequency  Min 1X/week        Progress Toward Goals  OT Goals(current goals can now be found in the care plan section)  Progress towards OT goals: Not progressing toward goals - comment;Goals drowngraded-see care plan  Acute Rehab OT Goals Patient Stated Goal: none stated today OT Goal Formulation: Patient unable to participate in goal setting Time For Goal Achievement: 11/07/22 Potential to Achieve Goals: Fair ADL Goals Pt Will Perform Eating: with min assist;bed level;sitting Pt Will Perform Grooming: with min assist;sitting;bed level Additional ADL Goal #1: Pt to demo bed mobility with Mod A in prep for EOB functional tasks  Plan Discharge plan remains appropriate;Frequency needs to be updated    Co-evaluation                 AM-PAC OT "6 Clicks" Daily Activity     Outcome Measure   Help from another person eating meals?: A Lot Help from another person taking care of personal grooming?: Total Help from another person toileting, which includes using toliet, bedpan, or urinal?: Total Help from another person bathing (including washing, rinsing, drying)?: Total Help from another person to put on and taking off regular upper body clothing?: Total Help from another person to put on and taking off regular lower body clothing?: Total 6 Click Score: 7    End of Session    OT Visit Diagnosis: Unsteadiness on feet (R26.81);Other abnormalities of gait and mobility (R26.89);Muscle weakness (generalized) (M62.81)   Activity Tolerance Other (comment) (limited by cognition)   Patient Left in bed;with call bell/phone within reach;with bed alarm set;Other (comment) (lab at bedside)   Nurse Communication Other (comment) (meal assist)         Time: 1610-9604 OT Time Calculation (min): 18 min  Charges: OT General Charges $OT Visit: 1 Visit OT Treatments $Self Care/Home Management : 8-22 mins  Bradd Canary, OTR/L Acute Rehab Services Office: [phone]   Lorre Munroe 10/24/2022, 8:42 AM

## 2022-10-24 NOTE — Progress Notes (Signed)
Speech Language Pathology Treatment: Dysphagia  Patient Details Name: Jacob Bass MRN: [id number] DOB: June 29, 1961 Today's Date: 10/24/2022 Time: [id number] SLP Time Calculation (min) (ACUTE ONLY): 13 min  Assessment / Plan / Recommendation Clinical Impression  Pt seen for dysphagia intervention following yesterday's MBS where he was able to upgrade to thin liquids via cup, no straw. Hand over hand cup sips grape juice with min oral holding prior to transit. There are multiple swallows with all consistencies in an effort to clear his oral cavity (during MBS pharyngeal residue was min-mild but he had oral residue after initial swallows). Little distracted today and needed min cues to "swallow." There were no signs of decreased airway protection and eructation x 1. Dys 2 was trialed with pieces of graham cracker in pudding. He did immediately begin to chew but mastication was prolonged and he eventually cleared oral cavity. It is recommended he continue on puree (Dys 1), thin liquids via cup (no straws), sit upright, allow pt to hold cup with hand over hand assist and crush pills in puree. ST to continue.  HPI HPI: Jacob Bass is a 62 y.o. male who presented to ED from [facility] via EMS with left sided weakness, speech changes, wheezing. PMH/PSH includes prior CVA, subarachnoid hemorrhage, cirrhosis, hep C, hx of etOH abuse, dementia, COPD.  Pt known to SLP from prior admissions, with baseline dysarthria and oral dysphagia. He was most recently seen by SLP on 04/13/22 with recommendations for Dysphagia 1 solids and thin liquids. NPO with Cortrak, can't get PEG due to acities. Repeat MBS for possible liquid upgrade.      SLP Plan  Continue with current plan of care      Recommendations for follow up therapy are one component of a multi-disciplinary discharge planning process, led by the attending physician.  Recommendations may be updated based on patient status, additional functional criteria and  insurance authorization.    Recommendations  Diet recommendations: Dysphagia 1 (puree);Thin liquid Liquids provided via: Cup;No straw Medication Administration: Crushed with puree Supervision: Patient able to self feed;Staff to assist with self feeding;Full supervision/cueing for compensatory strategies Compensations: Minimize environmental distractions;Slow rate;Small sips/bites Postural Changes and/or Swallow Maneuvers: Seated upright 90 degrees                Oral Care Recommendations: Oral care BID Follow Up Recommendations: Skilled nursing-short term rehab (<3 hours/day) Assistance recommended at discharge: Frequent or constant Supervision/Assistance SLP Visit Diagnosis: Dysphagia, oropharyngeal phase (R13.12) Plan: Continue with current plan of care           Houston Siren  10/24/2022, 10:08 AM

## 2022-10-25 DIAGNOSIS — I639 Cerebral infarction, unspecified: Secondary | ICD-10-CM | POA: Diagnosis not present

## 2022-10-25 LAB — GLUCOSE, CAPILLARY
Glucose-Capillary: 106 mg/dL — ABNORMAL HIGH (ref 70–99)
Glucose-Capillary: 115 mg/dL — ABNORMAL HIGH (ref 70–99)
Glucose-Capillary: 84 mg/dL (ref 70–99)
Glucose-Capillary: 84 mg/dL (ref 70–99)
Glucose-Capillary: 90 mg/dL (ref 70–99)
Glucose-Capillary: 97 mg/dL (ref 70–99)

## 2022-10-25 LAB — BASIC METABOLIC PANEL
Anion gap: 3 — ABNORMAL LOW (ref 5–15)
BUN: 27 mg/dL — ABNORMAL HIGH (ref 8–23)
CO2: 25 mmol/L (ref 22–32)
Calcium: 7.5 mg/dL — ABNORMAL LOW (ref 8.9–10.3)
Chloride: 112 mmol/L — ABNORMAL HIGH (ref 98–111)
Creatinine, Ser: 1.4 mg/dL — ABNORMAL HIGH (ref 0.61–1.24)
GFR, Estimated: 56 mL/min — ABNORMAL LOW (ref >60)
Glucose, Bld: 86 mg/dL (ref 70–99)
Potassium: 3.8 mmol/L (ref 3.5–5.1)
Sodium: 140 mmol/L (ref 135–145)

## 2022-10-25 NOTE — Progress Notes (Signed)
Physical Therapy Treatment Patient Details Name: Jacob Bass MRN: [id number] DOB: 04-22-1961 Today's Date: 10/25/2022   History of Present Illness 62 y.o. male presents to [facility] hospital on 09/10/2022 with AMS and L weakness from [facility] 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 Complicated by fluid overload. Paracentesis performed 1/14, 1/16, 1/21, 1/27. PMH includes CVA, COPD, cirrhosis, gastric ulcer.    PT Comments    Pt seemed fatigued today, followed commands <50% of time. Worked on sit>stand with stedy and support of LUE by therapist. Pt given max A +2 and achieved partial stand multiple times. Pt then performed seated activities increasing attention to R side and R lean. Pt very avoidant of this positioning but tolerated well with support. PT will continue to follow.    Recommendations for follow up therapy are one component of a multi-disciplinary discharge planning process, led by the attending physician.  Recommendations may be updated based on patient status, additional functional criteria and insurance authorization.  Follow Up Recommendations  Skilled nursing-short term rehab (<3 hours/day) Can patient physically be transported by private vehicle: No   Assistance Recommended at Discharge Frequent or constant Supervision/Assistance  Patient can return home with the following Two people to help with walking and/or transfers;Two people to help with bathing/dressing/bathroom;Assistance with cooking/housework;Assistance with feeding;Direct supervision/assist for medications management;Direct supervision/assist for financial management;Assist for transportation;Help with stairs or ramp for entrance   Equipment Recommendations  Wheelchair (measurements PT);Wheelchair cushion (measurements PT);Hospital bed;Other (comment) (hoyer lift)    Recommendations for Other Services       Precautions / Restrictions Precautions Precautions: Fall;Other (comment) Precaution Comments:  baseline L hemiparesis, incontinent Restrictions Weight Bearing Restrictions: No     Mobility  Bed Mobility Overal bed mobility: Needs Assistance Bed Mobility: Rolling, Supine to Sit, Sit to Supine Rolling: Min guard Sidelying to sit: Max assist, +2 for physical assistance   Sit to supine: Max assist, +2 for physical assistance   General bed mobility comments: rolls to L with min guard A. Anticipate this is not the case for the R as he is avoidant of everything to the R. Max A +2 for coming up to sitting and returning to supine    Transfers Overall transfer level: Needs assistance Equipment used: Ambulation equipment used Transfers: Sit to/from Stand Sit to Stand: From elevated surface, Max assist, +2 physical assistance           General transfer comment: attempted sit to stand with stedy 3x with max A +2 each time. Pt came 50% up first trial and 75% subsequent trials. LUE supported by therapist. Pt with L lean. RN requested pt not go to chair at this time as he has been trying to get OOB    Ambulation/Gait               General Gait Details: Unable   Stairs             Wheelchair Mobility    Modified Rankin (Stroke Patients Only) Modified Rankin (Stroke Patients Only) Pre-Morbid Rankin Score: Moderate disability Modified Rankin: Severe disability     Balance Overall balance assessment: Needs assistance Sitting-balance support: Feet supported, No upper extremity supported Sitting balance-Leahy Scale: Poor Sitting balance - Comments: posterior and L lean. Postural control: Posterior lean, Left lateral lean Standing balance support: Bilateral upper extremity supported Standing balance-Leahy Scale: Zero Standing balance comment: max a +2 to maintain standing               High Level Balance Comments:  worked on increasing attention to R side, leabing onto R elbow as well as support reaching to R side. This clearly made pt feel very insecure but  he tolerated.            Cognition Arousal/Alertness: Lethargic Behavior During Therapy: Flat affect Overall Cognitive Status: No family/caregiver present to determine baseline cognitive functioning                                 General Comments: followed commands <50% of time. Sometimes seemed to attend to session and at others did not        Exercises      General Comments General comments (skin integrity, edema, etc.): VSS      Pertinent Vitals/Pain Pain Assessment Pain Assessment: Faces Faces Pain Scale: Hurts little more Pain Location: L UE with passive mvmt Pain Descriptors / Indicators: Discomfort, Grimacing Pain Intervention(s): Monitored during session, Limited activity within patient's tolerance    Home Living                          Prior Function            PT Goals (current goals can now be found in the care plan section) Acute Rehab PT Goals Patient Stated Goal: not stated PT Goal Formulation: With patient Time For Goal Achievement: 11/05/22 Potential to Achieve Goals: Fair Progress towards PT goals: Progressing toward goals    Frequency    Min 2X/week      PT Plan Current plan remains appropriate    Co-evaluation              AM-PAC PT "6 Clicks" Mobility   Outcome Measure  Help needed turning from your back to your side while in a flat bed without using bedrails?: A Lot Help needed moving from lying on your back to sitting on the side of a flat bed without using bedrails?: Total Help needed moving to and from a bed to a chair (including a wheelchair)?: Total Help needed standing up from a chair using your arms (e.g., wheelchair or bedside chair)?: Total Help needed to walk in hospital room?: Total Help needed climbing 3-5 steps with a railing? : Total 6 Click Score: 7    End of Session Equipment Utilized During Treatment: Gait belt Activity Tolerance: Patient tolerated treatment well Patient left:  with call bell/phone within reach;in bed;with bed alarm set Nurse Communication: Mobility status;Need for lift equipment PT Visit Diagnosis: Other abnormalities of gait and mobility (R26.89);Muscle weakness (generalized) (M62.81);Hemiplegia and hemiparesis Hemiplegia - Right/Left: Left Hemiplegia - dominant/non-dominant: Non-dominant Hemiplegia - caused by: Cerebral infarction     Time: 1610-9604 PT Time Calculation (min) (ACUTE ONLY): 22 min  Charges:  $Therapeutic Activity: 8-22 mins                     Lyanne Co, PT  Acute Rehab Services Secure chat preferred Office [phone]    Lawana Chambers Nadina Fomby 10/25/2022, 12:44 PM

## 2022-10-25 NOTE — Progress Notes (Signed)
Severe right hip degenerative change. Ruthann Cancer, MD [facility] Radiology Electronically Signed   By: Ruthann Cancer M.D.   On: 10/23/2022 16:02   DG Swallowing Func-Speech Pathology  Result Date: 10/23/2022 Table formatting from the original result was not included. Modified Barium  Swallow Study Patient Details Name: Jacob Bass MRN: [id number] Date of Birth: 1960-09-14 Today's Date: 10/23/2022 HPI/PMH: HPI: Jacob Bass is a 62 y.o. male who presented to ED from [facility] via EMS with left sided weakness, speech changes, wheezing. PMH/PSH includes prior CVA, subarachnoid hemorrhage, cirrhosis, hep C, hx of etOH abuse, dementia, COPD.  Pt known to SLP from prior admissions, with baseline dysarthria and oral dysphagia. He was most recently seen by SLP on 04/13/22 with recommendations for Dysphagia 1 solids and thin liquids. NPO with Cortrak, can't get PEG due to acities. Repeat MBS for possible liquid upgrade. Clinical Impression: Clinical Impression: Pt's oral phase of swallow has improved since [name] 10/02/22. He continues with decreased bolus control however there was less escape to floor of mandible. Lingual residue present after all trials but volume was decreased from prior studies. Regular solid texture not given during study however therapist will work with pt at bedside to advance from puree. There was no aspiration however laryngeal penetration present with thin while using a straw. Decreased laryngeal elevation prevented early closure with barium entering vestibule prior to vestibule closure. Subsequent cup sips decreased velocity and prevented penetration consistently. Swallows were initiated at valleculae and several times in pyriform sinuses.Tongue base retraction is mildly reduced with mild valleculae and pyriform sinus residue with thin and nectar (no significant residue with puree or honey thick). Therapist recommends upgrade to thin liquids via CUP only, NO straws, sitting in upright position, small sips, cue to clear throat and pills crushed. Continue puree and SLP will work with pt in attempts to upgrade to chewable solids. Factors that may increase risk of adverse event in presence of aspiration ([facility] 2021): Factors that may increase risk of adverse event in  presence of aspiration ([place] 2021): Reduced cognitive function; Frail or deconditioned Recommendations/Plan: Swallowing Evaluation Recommendations Swallowing Evaluation Recommendations Recommendations: PO diet PO Diet Recommendation: Dysphagia 1 (Pureed); Thin liquids (Level 0) Liquid Administration via: Cup; No straw Medication Administration: Crushed with puree Supervision: Staff to assist with self-feeding; Full assist for feeding Swallowing strategies  : Slow rate; Small bites/sips; Clear throat intermittently Postural changes: Position pt fully upright for meals Oral care recommendations: Oral care BID (2x/day) Treatment Plan Treatment Plan Treatment recommendations: Therapy as outlined in treatment plan below Follow-up recommendations: Skilled nursing-short term rehab (<3 hours/day) Functional status assessment: Patient has had a recent decline in their functional status and demonstrates the ability to make significant improvements in function in a reasonable and predictable amount of time. Treatment frequency: Min 1x/week Treatment duration: 3 weeks Interventions: Compensatory techniques; Diet toleration management by SLP; Trials of upgraded texture/liquids Recommendations Recommendations for follow up therapy are one component of a multi-disciplinary discharge planning process, led by the attending physician.  Recommendations may be updated based on patient status, additional functional criteria and insurance authorization. Assessment: Orofacial Exam: Orofacial Exam Oral Cavity: Oral Hygiene: WFL Oral Cavity - Dentition: Edentulous Orofacial Anatomy: WFL Oral Motor/Sensory Function: WFL Anatomy: Anatomy: Suspected cervical osteophytes Thin Liquids: Thin Liquids (Level 0) Thin Liquids : Impaired Bolus delivery method: Spoon; Cup; Straw Thin Liquid - Impairment: Oral Impairment; Pharyngeal impairment Lip Closure: No labial escape Tongue control during bolus hold:  Severe right hip degenerative change. Ruthann Cancer, MD [facility] Radiology Electronically Signed   By: Ruthann Cancer M.D.   On: 10/23/2022 16:02   DG Swallowing Func-Speech Pathology  Result Date: 10/23/2022 Table formatting from the original result was not included. Modified Barium  Swallow Study Patient Details Name: Jacob Bass MRN: [id number] Date of Birth: 1960-09-14 Today's Date: 10/23/2022 HPI/PMH: HPI: Jacob Bass is a 62 y.o. male who presented to ED from [facility] via EMS with left sided weakness, speech changes, wheezing. PMH/PSH includes prior CVA, subarachnoid hemorrhage, cirrhosis, hep C, hx of etOH abuse, dementia, COPD.  Pt known to SLP from prior admissions, with baseline dysarthria and oral dysphagia. He was most recently seen by SLP on 04/13/22 with recommendations for Dysphagia 1 solids and thin liquids. NPO with Cortrak, can't get PEG due to acities. Repeat MBS for possible liquid upgrade. Clinical Impression: Clinical Impression: Pt's oral phase of swallow has improved since [name] 10/02/22. He continues with decreased bolus control however there was less escape to floor of mandible. Lingual residue present after all trials but volume was decreased from prior studies. Regular solid texture not given during study however therapist will work with pt at bedside to advance from puree. There was no aspiration however laryngeal penetration present with thin while using a straw. Decreased laryngeal elevation prevented early closure with barium entering vestibule prior to vestibule closure. Subsequent cup sips decreased velocity and prevented penetration consistently. Swallows were initiated at valleculae and several times in pyriform sinuses.Tongue base retraction is mildly reduced with mild valleculae and pyriform sinus residue with thin and nectar (no significant residue with puree or honey thick). Therapist recommends upgrade to thin liquids via CUP only, NO straws, sitting in upright position, small sips, cue to clear throat and pills crushed. Continue puree and SLP will work with pt in attempts to upgrade to chewable solids. Factors that may increase risk of adverse event in presence of aspiration ([facility] 2021): Factors that may increase risk of adverse event in  presence of aspiration ([place] 2021): Reduced cognitive function; Frail or deconditioned Recommendations/Plan: Swallowing Evaluation Recommendations Swallowing Evaluation Recommendations Recommendations: PO diet PO Diet Recommendation: Dysphagia 1 (Pureed); Thin liquids (Level 0) Liquid Administration via: Cup; No straw Medication Administration: Crushed with puree Supervision: Staff to assist with self-feeding; Full assist for feeding Swallowing strategies  : Slow rate; Small bites/sips; Clear throat intermittently Postural changes: Position pt fully upright for meals Oral care recommendations: Oral care BID (2x/day) Treatment Plan Treatment Plan Treatment recommendations: Therapy as outlined in treatment plan below Follow-up recommendations: Skilled nursing-short term rehab (<3 hours/day) Functional status assessment: Patient has had a recent decline in their functional status and demonstrates the ability to make significant improvements in function in a reasonable and predictable amount of time. Treatment frequency: Min 1x/week Treatment duration: 3 weeks Interventions: Compensatory techniques; Diet toleration management by SLP; Trials of upgraded texture/liquids Recommendations Recommendations for follow up therapy are one component of a multi-disciplinary discharge planning process, led by the attending physician.  Recommendations may be updated based on patient status, additional functional criteria and insurance authorization. Assessment: Orofacial Exam: Orofacial Exam Oral Cavity: Oral Hygiene: WFL Oral Cavity - Dentition: Edentulous Orofacial Anatomy: WFL Oral Motor/Sensory Function: WFL Anatomy: Anatomy: Suspected cervical osteophytes Thin Liquids: Thin Liquids (Level 0) Thin Liquids : Impaired Bolus delivery method: Spoon; Cup; Straw Thin Liquid - Impairment: Oral Impairment; Pharyngeal impairment Lip Closure: No labial escape Tongue control during bolus hold:  [facility] PROGRESS NOTE   Kayston Aldona Lento [id number] DOB: 1960/10/09 DOA: 09/10/2022  PCP: Tyrone Schimke., MD  Brief History/Interval Summary:  62 y.o. male with PMH significant for history of stroke with residual left-sided deficits, HTN, liver cirrhosis, chronic anemia, COPD, recent diagnosis of COVID who lives at [facility]. On 1/8, patient was sent to the ED for altered mental status and weakness.  Last known normal the previous night. MRI brain positive for small acute infarct. Because of stroke, patient has dysphagia, expressive aphasia and worsening of left-sided deficits.   His hospital course has been complicated by recurrent ascites and dysphagia.  He has required multiple paracentesis.  Started on nasogastric tube feedings. Not candidate for PEG tube placement due to recurrent ascites. Feedings had to be held for 2 days due to recurrent aspirations and tube clogging.  Now on nocturnal feeds. Palliative care consulted.  Legal guardianship is in process.  Poor prognosis. Patient remains full code.  Patient disposition continues to be complicated due to his need for DSS/Guardianship - meeting 10/11/22 preliminary hearing completed. Next meeting 10/24/22 to hopefully decide guardianship and further medical care planning. Once guardianship has been confirmed may need to discuss long term goals of care given he cannot have PEG tube (secondary to ascites).  Tentative plan to discharge on 10/26/2022 back to facility pending p.o. tolerance and sodium levels.  Consultants: Palliative care.  Subjective/Interval History: Patient is awake alert.  Not communicative due to aphasia.    Assessment/Plan:  Liver cirrhosis with anasarca/ascites/history of hepatitis C and alcohol abuse Per history, patient had generalized anasarca and worsening pedal edema since October. Presented with significant ascites and has required multiple paracentesis so far during this admission. Continue lasix,  spironolactone Last paracentesis on 2/8 with drainage of 3L of clear yellow liquid. Interventional radiology requested to follow-up for possible repeat paracentesis given abdominal distention today LFTs have been stable. Ultrasound showed cirrhotic appearance of liver. Patient with previous history of alcohol abuse and history of hepatitis C.  Please note that patient has another medical record MRN [id number]. Given vitamin K previously - last INR was noted to be 1.4.  Transient hematochezia in the setting of liver cirrhosis Acute GI bleed less likely Acute blood loss anemia on chronic anemia of chronic disease, stable On 1/24, patient had an episode of hematochezia, hematemesis.  Tube feeding was stopped.  GI consultation was obtained.  Patient was started on Protonix.  GI did not feel that endoscopy was indicated at that time Patient had recurrence of hematochezia on 2/15 and then again on 2/18 with no significant drop in hemoglobin, likely transient hemorrhoidal bleed - no further intervention/evaluation at this time. CT abd angiogram limited evaluation due to retained barium Continue PPI.  Acute kidney injury/hypokalemia Hypovolemic hyponatremia Sodium corrected previously with IVF - follow oral intake today Previous baseline creatinine around 1.   Presented with creatinine of 2.08, peaked at 2.12.  Likely has a new baseline now around 1.5-1.6  Oropharyngeal dysphagia Secondary to stroke. Was on NG tube feedings.  Seen by speech therapy.  Now on dysphagia 1 diet.   After discussions with speech therapy and dietitian NG tube was removed on 2/13.   Patient does have good meal intake but his fluid intake is less than adequate.  He is not cleared for thin liquids. Sodium continued to increase and so he was placed on D5 infusion. Speech therapy to reevaluate if he can be cleared for thin liquids.  Situation could be  [facility] PROGRESS NOTE   Kayston Aldona Lento [id number] DOB: 1960/10/09 DOA: 09/10/2022  PCP: Tyrone Schimke., MD  Brief History/Interval Summary:  62 y.o. male with PMH significant for history of stroke with residual left-sided deficits, HTN, liver cirrhosis, chronic anemia, COPD, recent diagnosis of COVID who lives at [facility]. On 1/8, patient was sent to the ED for altered mental status and weakness.  Last known normal the previous night. MRI brain positive for small acute infarct. Because of stroke, patient has dysphagia, expressive aphasia and worsening of left-sided deficits.   His hospital course has been complicated by recurrent ascites and dysphagia.  He has required multiple paracentesis.  Started on nasogastric tube feedings. Not candidate for PEG tube placement due to recurrent ascites. Feedings had to be held for 2 days due to recurrent aspirations and tube clogging.  Now on nocturnal feeds. Palliative care consulted.  Legal guardianship is in process.  Poor prognosis. Patient remains full code.  Patient disposition continues to be complicated due to his need for DSS/Guardianship - meeting 10/11/22 preliminary hearing completed. Next meeting 10/24/22 to hopefully decide guardianship and further medical care planning. Once guardianship has been confirmed may need to discuss long term goals of care given he cannot have PEG tube (secondary to ascites).  Tentative plan to discharge on 10/26/2022 back to facility pending p.o. tolerance and sodium levels.  Consultants: Palliative care.  Subjective/Interval History: Patient is awake alert.  Not communicative due to aphasia.    Assessment/Plan:  Liver cirrhosis with anasarca/ascites/history of hepatitis C and alcohol abuse Per history, patient had generalized anasarca and worsening pedal edema since October. Presented with significant ascites and has required multiple paracentesis so far during this admission. Continue lasix,  spironolactone Last paracentesis on 2/8 with drainage of 3L of clear yellow liquid. Interventional radiology requested to follow-up for possible repeat paracentesis given abdominal distention today LFTs have been stable. Ultrasound showed cirrhotic appearance of liver. Patient with previous history of alcohol abuse and history of hepatitis C.  Please note that patient has another medical record MRN [id number]. Given vitamin K previously - last INR was noted to be 1.4.  Transient hematochezia in the setting of liver cirrhosis Acute GI bleed less likely Acute blood loss anemia on chronic anemia of chronic disease, stable On 1/24, patient had an episode of hematochezia, hematemesis.  Tube feeding was stopped.  GI consultation was obtained.  Patient was started on Protonix.  GI did not feel that endoscopy was indicated at that time Patient had recurrence of hematochezia on 2/15 and then again on 2/18 with no significant drop in hemoglobin, likely transient hemorrhoidal bleed - no further intervention/evaluation at this time. CT abd angiogram limited evaluation due to retained barium Continue PPI.  Acute kidney injury/hypokalemia Hypovolemic hyponatremia Sodium corrected previously with IVF - follow oral intake today Previous baseline creatinine around 1.   Presented with creatinine of 2.08, peaked at 2.12.  Likely has a new baseline now around 1.5-1.6  Oropharyngeal dysphagia Secondary to stroke. Was on NG tube feedings.  Seen by speech therapy.  Now on dysphagia 1 diet.   After discussions with speech therapy and dietitian NG tube was removed on 2/13.   Patient does have good meal intake but his fluid intake is less than adequate.  He is not cleared for thin liquids. Sodium continued to increase and so he was placed on D5 infusion. Speech therapy to reevaluate if he can be cleared for thin liquids.  Situation could be  [facility] PROGRESS NOTE   Kayston Aldona Lento [id number] DOB: 1960/10/09 DOA: 09/10/2022  PCP: Tyrone Schimke., MD  Brief History/Interval Summary:  62 y.o. male with PMH significant for history of stroke with residual left-sided deficits, HTN, liver cirrhosis, chronic anemia, COPD, recent diagnosis of COVID who lives at [facility]. On 1/8, patient was sent to the ED for altered mental status and weakness.  Last known normal the previous night. MRI brain positive for small acute infarct. Because of stroke, patient has dysphagia, expressive aphasia and worsening of left-sided deficits.   His hospital course has been complicated by recurrent ascites and dysphagia.  He has required multiple paracentesis.  Started on nasogastric tube feedings. Not candidate for PEG tube placement due to recurrent ascites. Feedings had to be held for 2 days due to recurrent aspirations and tube clogging.  Now on nocturnal feeds. Palliative care consulted.  Legal guardianship is in process.  Poor prognosis. Patient remains full code.  Patient disposition continues to be complicated due to his need for DSS/Guardianship - meeting 10/11/22 preliminary hearing completed. Next meeting 10/24/22 to hopefully decide guardianship and further medical care planning. Once guardianship has been confirmed may need to discuss long term goals of care given he cannot have PEG tube (secondary to ascites).  Tentative plan to discharge on 10/26/2022 back to facility pending p.o. tolerance and sodium levels.  Consultants: Palliative care.  Subjective/Interval History: Patient is awake alert.  Not communicative due to aphasia.    Assessment/Plan:  Liver cirrhosis with anasarca/ascites/history of hepatitis C and alcohol abuse Per history, patient had generalized anasarca and worsening pedal edema since October. Presented with significant ascites and has required multiple paracentesis so far during this admission. Continue lasix,  spironolactone Last paracentesis on 2/8 with drainage of 3L of clear yellow liquid. Interventional radiology requested to follow-up for possible repeat paracentesis given abdominal distention today LFTs have been stable. Ultrasound showed cirrhotic appearance of liver. Patient with previous history of alcohol abuse and history of hepatitis C.  Please note that patient has another medical record MRN [id number]. Given vitamin K previously - last INR was noted to be 1.4.  Transient hematochezia in the setting of liver cirrhosis Acute GI bleed less likely Acute blood loss anemia on chronic anemia of chronic disease, stable On 1/24, patient had an episode of hematochezia, hematemesis.  Tube feeding was stopped.  GI consultation was obtained.  Patient was started on Protonix.  GI did not feel that endoscopy was indicated at that time Patient had recurrence of hematochezia on 2/15 and then again on 2/18 with no significant drop in hemoglobin, likely transient hemorrhoidal bleed - no further intervention/evaluation at this time. CT abd angiogram limited evaluation due to retained barium Continue PPI.  Acute kidney injury/hypokalemia Hypovolemic hyponatremia Sodium corrected previously with IVF - follow oral intake today Previous baseline creatinine around 1.   Presented with creatinine of 2.08, peaked at 2.12.  Likely has a new baseline now around 1.5-1.6  Oropharyngeal dysphagia Secondary to stroke. Was on NG tube feedings.  Seen by speech therapy.  Now on dysphagia 1 diet.   After discussions with speech therapy and dietitian NG tube was removed on 2/13.   Patient does have good meal intake but his fluid intake is less than adequate.  He is not cleared for thin liquids. Sodium continued to increase and so he was placed on D5 infusion. Speech therapy to reevaluate if he can be cleared for thin liquids.  Situation could be  [facility] PROGRESS NOTE   Kayston Aldona Lento [id number] DOB: 1960/10/09 DOA: 09/10/2022  PCP: Tyrone Schimke., MD  Brief History/Interval Summary:  62 y.o. male with PMH significant for history of stroke with residual left-sided deficits, HTN, liver cirrhosis, chronic anemia, COPD, recent diagnosis of COVID who lives at [facility]. On 1/8, patient was sent to the ED for altered mental status and weakness.  Last known normal the previous night. MRI brain positive for small acute infarct. Because of stroke, patient has dysphagia, expressive aphasia and worsening of left-sided deficits.   His hospital course has been complicated by recurrent ascites and dysphagia.  He has required multiple paracentesis.  Started on nasogastric tube feedings. Not candidate for PEG tube placement due to recurrent ascites. Feedings had to be held for 2 days due to recurrent aspirations and tube clogging.  Now on nocturnal feeds. Palliative care consulted.  Legal guardianship is in process.  Poor prognosis. Patient remains full code.  Patient disposition continues to be complicated due to his need for DSS/Guardianship - meeting 10/11/22 preliminary hearing completed. Next meeting 10/24/22 to hopefully decide guardianship and further medical care planning. Once guardianship has been confirmed may need to discuss long term goals of care given he cannot have PEG tube (secondary to ascites).  Tentative plan to discharge on 10/26/2022 back to facility pending p.o. tolerance and sodium levels.  Consultants: Palliative care.  Subjective/Interval History: Patient is awake alert.  Not communicative due to aphasia.    Assessment/Plan:  Liver cirrhosis with anasarca/ascites/history of hepatitis C and alcohol abuse Per history, patient had generalized anasarca and worsening pedal edema since October. Presented with significant ascites and has required multiple paracentesis so far during this admission. Continue lasix,  spironolactone Last paracentesis on 2/8 with drainage of 3L of clear yellow liquid. Interventional radiology requested to follow-up for possible repeat paracentesis given abdominal distention today LFTs have been stable. Ultrasound showed cirrhotic appearance of liver. Patient with previous history of alcohol abuse and history of hepatitis C.  Please note that patient has another medical record MRN [id number]. Given vitamin K previously - last INR was noted to be 1.4.  Transient hematochezia in the setting of liver cirrhosis Acute GI bleed less likely Acute blood loss anemia on chronic anemia of chronic disease, stable On 1/24, patient had an episode of hematochezia, hematemesis.  Tube feeding was stopped.  GI consultation was obtained.  Patient was started on Protonix.  GI did not feel that endoscopy was indicated at that time Patient had recurrence of hematochezia on 2/15 and then again on 2/18 with no significant drop in hemoglobin, likely transient hemorrhoidal bleed - no further intervention/evaluation at this time. CT abd angiogram limited evaluation due to retained barium Continue PPI.  Acute kidney injury/hypokalemia Hypovolemic hyponatremia Sodium corrected previously with IVF - follow oral intake today Previous baseline creatinine around 1.   Presented with creatinine of 2.08, peaked at 2.12.  Likely has a new baseline now around 1.5-1.6  Oropharyngeal dysphagia Secondary to stroke. Was on NG tube feedings.  Seen by speech therapy.  Now on dysphagia 1 diet.   After discussions with speech therapy and dietitian NG tube was removed on 2/13.   Patient does have good meal intake but his fluid intake is less than adequate.  He is not cleared for thin liquids. Sodium continued to increase and so he was placed on D5 infusion. Speech therapy to reevaluate if he can be cleared for thin liquids.  Situation could be  [facility] PROGRESS NOTE   Kayston Aldona Lento [id number] DOB: 1960/10/09 DOA: 09/10/2022  PCP: Tyrone Schimke., MD  Brief History/Interval Summary:  62 y.o. male with PMH significant for history of stroke with residual left-sided deficits, HTN, liver cirrhosis, chronic anemia, COPD, recent diagnosis of COVID who lives at [facility]. On 1/8, patient was sent to the ED for altered mental status and weakness.  Last known normal the previous night. MRI brain positive for small acute infarct. Because of stroke, patient has dysphagia, expressive aphasia and worsening of left-sided deficits.   His hospital course has been complicated by recurrent ascites and dysphagia.  He has required multiple paracentesis.  Started on nasogastric tube feedings. Not candidate for PEG tube placement due to recurrent ascites. Feedings had to be held for 2 days due to recurrent aspirations and tube clogging.  Now on nocturnal feeds. Palliative care consulted.  Legal guardianship is in process.  Poor prognosis. Patient remains full code.  Patient disposition continues to be complicated due to his need for DSS/Guardianship - meeting 10/11/22 preliminary hearing completed. Next meeting 10/24/22 to hopefully decide guardianship and further medical care planning. Once guardianship has been confirmed may need to discuss long term goals of care given he cannot have PEG tube (secondary to ascites).  Tentative plan to discharge on 10/26/2022 back to facility pending p.o. tolerance and sodium levels.  Consultants: Palliative care.  Subjective/Interval History: Patient is awake alert.  Not communicative due to aphasia.    Assessment/Plan:  Liver cirrhosis with anasarca/ascites/history of hepatitis C and alcohol abuse Per history, patient had generalized anasarca and worsening pedal edema since October. Presented with significant ascites and has required multiple paracentesis so far during this admission. Continue lasix,  spironolactone Last paracentesis on 2/8 with drainage of 3L of clear yellow liquid. Interventional radiology requested to follow-up for possible repeat paracentesis given abdominal distention today LFTs have been stable. Ultrasound showed cirrhotic appearance of liver. Patient with previous history of alcohol abuse and history of hepatitis C.  Please note that patient has another medical record MRN [id number]. Given vitamin K previously - last INR was noted to be 1.4.  Transient hematochezia in the setting of liver cirrhosis Acute GI bleed less likely Acute blood loss anemia on chronic anemia of chronic disease, stable On 1/24, patient had an episode of hematochezia, hematemesis.  Tube feeding was stopped.  GI consultation was obtained.  Patient was started on Protonix.  GI did not feel that endoscopy was indicated at that time Patient had recurrence of hematochezia on 2/15 and then again on 2/18 with no significant drop in hemoglobin, likely transient hemorrhoidal bleed - no further intervention/evaluation at this time. CT abd angiogram limited evaluation due to retained barium Continue PPI.  Acute kidney injury/hypokalemia Hypovolemic hyponatremia Sodium corrected previously with IVF - follow oral intake today Previous baseline creatinine around 1.   Presented with creatinine of 2.08, peaked at 2.12.  Likely has a new baseline now around 1.5-1.6  Oropharyngeal dysphagia Secondary to stroke. Was on NG tube feedings.  Seen by speech therapy.  Now on dysphagia 1 diet.   After discussions with speech therapy and dietitian NG tube was removed on 2/13.   Patient does have good meal intake but his fluid intake is less than adequate.  He is not cleared for thin liquids. Sodium continued to increase and so he was placed on D5 infusion. Speech therapy to reevaluate if he can be cleared for thin liquids.  Situation could be

## 2022-10-26 ENCOUNTER — Other Ambulatory Visit (HOSPITAL_COMMUNITY): Payer: Self-pay

## 2022-10-26 DIAGNOSIS — I639 Cerebral infarction, unspecified: Secondary | ICD-10-CM | POA: Diagnosis not present

## 2022-10-26 LAB — BASIC METABOLIC PANEL
Anion gap: 6 (ref 5–15)
BUN: 33 mg/dL — ABNORMAL HIGH (ref 8–23)
CO2: 23 mmol/L (ref 22–32)
Calcium: 7.8 mg/dL — ABNORMAL LOW (ref 8.9–10.3)
Chloride: 114 mmol/L — ABNORMAL HIGH (ref 98–111)
Creatinine, Ser: 1.5 mg/dL — ABNORMAL HIGH (ref 0.61–1.24)
GFR, Estimated: 52 mL/min — ABNORMAL LOW (ref >60)
Glucose, Bld: 71 mg/dL (ref 70–99)
Potassium: 3.8 mmol/L (ref 3.5–5.1)
Sodium: 143 mmol/L (ref 135–145)

## 2022-10-26 LAB — GLUCOSE, CAPILLARY
Glucose-Capillary: 104 mg/dL — ABNORMAL HIGH (ref 70–99)
Glucose-Capillary: 86 mg/dL (ref 70–99)

## 2022-10-26 LAB — CBC
HCT: 26.7 % — ABNORMAL LOW (ref 39.0–52.0)
Hemoglobin: 8.5 g/dL — ABNORMAL LOW (ref 13.0–17.0)
MCH: 33.7 pg (ref 26.0–34.0)
MCHC: 31.8 g/dL (ref 30.0–36.0)
MCV: 106 fL — ABNORMAL HIGH (ref 80.0–100.0)
Platelets: 64 10*3/uL — ABNORMAL LOW (ref 150–400)
RBC: 2.52 MIL/uL — ABNORMAL LOW (ref 4.22–5.81)
RDW: 16.6 % — ABNORMAL HIGH (ref 11.5–15.5)
WBC: 8.2 10*3/uL (ref 4.0–10.5)
nRBC: 0 % (ref 0.0–0.2)

## 2022-10-26 MED ORDER — IPRATROPIUM-ALBUTEROL 0.5-2.5 (3) MG/3ML IN SOLN
3.0000 mL | RESPIRATORY_TRACT | 1 refills | Status: DC
  Filled 2022-10-26: qty 360, 20d supply, fill #0

## 2022-10-26 MED ORDER — SENNOSIDES-DOCUSATE SODIUM 8.6-50 MG PO TABS: 1.0000 | ORAL_TABLET | Freq: Every evening | ORAL | 1 refills | Status: DC | PRN

## 2022-10-26 MED ORDER — IPRATROPIUM-ALBUTEROL 0.5-2.5 (3) MG/3ML IN SOLN: 3.0000 mL | RESPIRATORY_TRACT | 1 refills | Status: DC

## 2022-10-26 MED ORDER — GERHARDT'S BUTT CREAM
1.0000 | TOPICAL_CREAM | Freq: Four times a day (QID) | CUTANEOUS | 1 refills | Status: DC | PRN
  Filled 2022-10-26: qty 1, 1d supply, fill #0

## 2022-10-26 MED ORDER — LACTULOSE 10 GM/15ML PO SOLN: 10.0000 g | Freq: Two times a day (BID) | ORAL | 0 refills | Status: DC

## 2022-10-26 MED ORDER — GERHARDT'S BUTT CREAM: 1.0000 | TOPICAL_CREAM | Freq: Four times a day (QID) | CUTANEOUS | 1 refills | Status: DC | PRN

## 2022-10-26 MED ORDER — SENNOSIDES-DOCUSATE SODIUM 8.6-50 MG PO TABS
1.0000 | ORAL_TABLET | Freq: Every evening | ORAL | 1 refills | Status: DC | PRN
  Filled 2022-10-26: qty 30, 30d supply, fill #0

## 2022-10-26 MED ORDER — LACTULOSE 10 GM/15ML PO SOLN
10.0000 g | Freq: Two times a day (BID) | ORAL | 0 refills | Status: DC
  Filled 2022-10-26: qty 236, 8d supply, fill #0

## 2022-10-26 NOTE — TOC Transition Note (Signed)
Transition of Care [facility]) - CM/SW Discharge Note   Patient Details  Name: Marz Janice MRN: [id number] Date of Birth: 02-19-1961  Transition of Care [facility]) CM/SW Contact:  Geralynn Ochs, LCSW Phone Number: 10/26/2022, 11:21 AM   Clinical Narrative:   CSW notified by MD that patient stable for return to [facility], sent discharge information. CSW left a voicemail for APS worker, Lavella Hammock, to inform her of patient returning to SNF while awaiting guardianship. Transport arranged with [facility] for next available.  Nurse to call report to [phone].    Final next level of care: Skilled Nursing Facility Barriers to Discharge: Barriers Resolved   Patient Goals and CMS Choice CMS Medicare.gov Compare Post Acute Care list provided to:: Other (Comment Required) Choice offered to / list presented to : NA  Discharge Placement                Patient chooses bed at:  [facility]) Patient to be transferred to facility by: [place] Name of family member notified: Lavella Hammock, APS Patient and family notified of of transfer: 10/26/22  Discharge Plan and Services Additional resources added to the After Visit Summary for       Post Acute Care Choice: [place]                               Social Determinants of Health (SDOH) Interventions SDOH Screenings   Tobacco Use: High Risk (10/24/2022)     Readmission Risk Interventions     No data to display

## 2022-10-26 NOTE — Progress Notes (Signed)
Pt safety discharged with [facility]. VS wnL and as per flow. IVs and male primofit removed. Report given to [facility]. All questions and concerns addressed.

## 2022-10-26 NOTE — Discharge Summary (Signed)
Physician Discharge Summary  Jacob Bass [id number] DOB: [birth year] DOA: 09/10/2022  PCP: Tyrone Schimke., MD  Admit date: 09/10/2022 Discharge date: 10/26/2022  Admitted From: Facility Disposition:  Same  Recommendations for Outpatient Follow-up:  Follow up with PCP in 1-2 weeks Follow up with guardian/appointments as scheduled Repeat basic metabolic panel in 1 week to ensure potassium and sodium remain within normal limits  Discharge Condition: Guarded CODE STATUS: Full Diet recommendation: Dysphagia 1 diet, thin liquids, no straws  Brief/Interim Summary: 62 y.o. male with PMH significant for history of stroke with residual left-sided deficits, HTN, liver cirrhosis, chronic anemia, COPD, recent diagnosis of COVID who lives at [facility]. On 1/8, patient was sent to the ED for altered mental status and weakness.  Last known normal the previous night. MRI brain positive for small acute infarct. Because of stroke, patient has dysphagia, expressive aphasia and worsening of left-sided deficits.    His hospital course has been complicated by recurrent ascites and dysphagia.  He has required multiple paracentesis.  Started on nasogastric tube feedings. Not candidate for PEG tube placement due to recurrent ascites. Feedings had to be held for 2 days due to recurrent aspirations and tube clogging.  Now on nocturnal feeds. Palliative care consulted.  Legal guardianship is in process.  Poor prognosis. Patient remains full code.   Patient disposition continues to be complicated due to his need for DSS/Guardianship - meeting 10/11/22 preliminary hearing completed. Next meeting 10/24/22 to hopefully decide guardianship and further medical care planning. Once guardianship has been confirmed may need to discuss long term goals of care given he cannot have PEG tube (secondary to ascites).  Patient is stable to discharge on 10/26/2022 back to facility given improving p.o. intake and stabilizing  labs.  Discharge Diagnoses:  Principal Problem:   Acute CVA (cerebrovascular accident) ([place]) Active Problems:   History of CVA (cerebrovascular accident)   HTN (hypertension)   COPD (chronic obstructive pulmonary disease) (HCC)   Anemia   Cirrhosis (HCC)   PUD (peptic ulcer disease)   Stroke (cerebrum) (HCC)   Malnutrition of moderate degree   Heme positive stool  Liver cirrhosis with anasarca/ascites/history of hepatitis C and alcohol abuse Per history, patient had generalized anasarca and worsening pedal edema since October. Presented with significant ascites and has required multiple paracentesis so far during this admission. Continue lasix, spironolactone Last paracentesis on 2/8 with drainage of 3L of clear yellow liquid. Continue to follow abdominal distention, if worsening would recommend repeat for paracentesis - currently soft/non-distended LFTs have been stable. Ultrasound showed cirrhotic appearance of liver. Patient with previous history of alcohol abuse and history of hepatitis C.  Please note that patient has another medical record MRN [id number]. Given vitamin K previously - last INR was noted to be 1.4.   Transient hematochezia in the setting of liver cirrhosis Acute GI bleed less likely Acute blood loss anemia on chronic anemia of chronic disease, stable On 1/24, patient had an episode of hematochezia, hematemesis.  Tube feeding was stopped.  GI consultation was obtained.  Patient was started on Protonix.  GI did not feel that endoscopy was indicated at that time Patient had recurrence of hematochezia on 2/15 and then again on 2/18 with no significant drop in hemoglobin, likely transient hemorrhoidal bleed - no further intervention/evaluation at this time. CT abd angiogram limited evaluation due to retained barium Continue PPI.   Acute kidney injury/hypokalemia Hypovolemic hyponatremia Sodium corrected previously with IVF - follow oral intake today Previous  baseline creatinine  around 1.   Presented with creatinine of 2.08, peaked at 2.12.  Likely has a new baseline now around 1.5-1.6   Oropharyngeal dysphagia Secondary to stroke. Was on NG tube feedings.  Seen by speech therapy.  Now on dysphagia 1 diet.   After discussions with speech therapy and dietitian NG tube was removed on 2/13.   Patient does have good meal intake but his fluid intake is less than adequate.  He is not cleared for thin liquids. Sodium continued to increase and so he was placed on D5 infusion. Speech therapy to reevaluate if he can be cleared for thin liquids.  Situation could be  complicated if he is not cleared for thin liquids and continues to require IV fluids for recurrent hypernatremia.   Acute CVA/Prior CVA with left-sided deficits MRI showed left acute CVA. Stroke workup completed.  Echo showed EF of 55 to 60% with no cardiac source of embolism A1c 4.5, LDL 74 Because of GI bleeding, patient is currently not on any antithrombotic or anticoagulant. Statin not started because of underlying liver cirrhosis Current deficit include expressive aphasia and dysphagia.  Continues to have left-sided weakness though improved range of motion noted in the lower extremities.   Hypoglycemia, chronic Continues to have hypoglycemia in the morning despite increased PO intake (and previously despite overnight feeds) Patient is not on any insulin or diabetic medications that could cause his recurrent hypoglycemia.  His liver disease is likely contributing. Recommend late-night snack.  Discussed with nursing staff. Cortisol level is 12.7. CBGs have been stable for the most part.   Hypernatremia/normal anion gap metabolic acidosis Previously his sodium level had peaked at 158.  Improved after he was given D5 infusion along with free water down his NG tube. NG tube has been removed.  Sodium level continued to increase again so he was placed back on D5 infusion.  Sodium level has  improved.  See discussion above.    Aspiration pneumonitis versus pneumonia, resolved Completed ceftriaxone.  Respiratory status is stable.   Acute hepatic encephalopathy, improved but not back to previous baseline, stable Ammonia level was initially 99, peak at 133, improved with lactulose Mentation is stable - likely his new baseline unfortunately   Thrombocytopenia, chronic Secondary to liver cirrhosis.  Stable for the most part.  Continue to monitor.   COPD Without acute exacerbation. Continue bronchodilators   BPH Flomax   Impaired mobility PT evaluation obtained.  Return to SNF with ongoing physical therapy recommended   Goals of care Palliative care is following.  DSS is involved. [facility] for guardianship Lavella Hammock, [phone]). On 1/29, palliative care reached out to DSS again.  They were told that petition was filed on 1/29 for guardianship.   Ethics is involved.  Remains full code as of now. Temporary guardianship with [employer] [phone]) as of 10/24/21 until state finalizes guardianship    Moderate to severe caloric malnutrition Etiology: chronic illness  Discharge Instructions  Discharge Instructions     Ambulatory referral to Neurology   Complete by: As directed    Follow up with stroke clinic NP (Jessica Vanschaick or Cecille Rubin, if both not available, consider Zachery Dauer, or Ahern) at [facility] in about 4 weeks. Thanks.      Allergies as of 10/26/2022       Reactions   Penicillins Other (See Comments)   Unknown reaction Listed on MAR        Medication List     STOP taking these medications  amLODipine 5 MG tablet Commonly known as: NORVASC   atorvastatin 40 MG tablet Commonly known as: LIPITOR   ciprofloxacin 500 MG tablet Commonly known as: CIPRO   lisinopril 5 MG tablet Commonly known as: ZESTRIL       TAKE these medications    acetaminophen 500 MG tablet Commonly known as: TYLENOL Take 1,000  mg by mouth every 8 (eight) hours as needed (moderate/severe pain).   albuterol 108 (90 Base) MCG/ACT inhaler Commonly known as: VENTOLIN HFA Inhale 2 puffs into the lungs every 4 (four) hours as needed for wheezing or shortness of breath.   Calcium + Vitamin D3 600-5 MG-MCG Tabs Generic drug: Calcium Carb-Cholecalciferol Take 1 tablet by mouth daily at 12 noon. (1200)   cyanocobalamin 1000 MCG tablet Commonly known as: VITAMIN B12 Take 1,000 mcg by mouth in the morning. (0900)   fluticasone-salmeterol 250-50 MCG/ACT Aepb Commonly known as: ADVAIR Inhale 1 puff into the lungs in the morning and at bedtime. ([id number], [id number])   folic acid 1 MG tablet Commonly known as: FOLVITE Take 1 mg by mouth in the morning. (0900)   furosemide 20 MG tablet Commonly known as: LASIX Take 40 mg by mouth in the morning. (0900)   Gerhardt's butt cream Crea Apply 1 Application topically 4 (four) times daily as needed for irritation.   ipratropium-albuterol 0.5-2.5 (3) MG/3ML Soln Commonly known as: DUONEB Take 3 mLs by nebulization every 4 (four) hours. What changed: Another medication with the same name was removed. Continue taking this medication, and follow the directions you see here.   lactulose 10 GM/15ML solution Commonly known as: CHRONULAC Take 15 mLs (10 g total) by mouth 2 (two) times daily. What changed:  how much to take when to take this additional instructions   latanoprost 0.005 % ophthalmic solution Commonly known as: XALATAN Place 1 drop into both eyes at bedtime. (2100)   loperamide 2 MG tablet Commonly known as: IMODIUM A-D Take 4 mg by mouth 4 (four) times daily as needed for diarrhea or loose stools.   magnesium oxide 400 (240 Mg) MG tablet Commonly known as: MAG-OX Take 400 mg by mouth 3 (three) times daily. (0900, 1400, 2100)   melatonin 5 MG Tabs Take 5 mg by mouth at bedtime. (2100)   metoprolol tartrate 25 MG tablet Commonly known as: LOPRESSOR Take 25 mg  by mouth 2 (two) times daily. (0800, 2000)   Nutritional Drink Liqd Take 120 mLs by mouth 3 (three) times daily. House supplement (0900, 1400, 2100)   pantoprazole 40 MG tablet Commonly known as: PROTONIX Take 40 mg by mouth 2 (two) times daily. (0800, 2000)   senna-docusate 8.6-50 MG tablet Commonly known as: Senokot-S Take 1 tablet by mouth at bedtime as needed for mild constipation or moderate constipation.   tamsulosin 0.4 MG Caps capsule Commonly known as: FLOMAX Take 0.4 mg by mouth in the morning. (0900)   tiotropium 18 MCG inhalation capsule Commonly known as: SPIRIVA Place 18 mcg into inhaler and inhale in the morning. (0900)        Follow-up Information      [facility]. Schedule an appointment as soon as possible for a visit in 1 month(s).   Specialty: Neurology Why: stroke clinic Contact information: [place] [address] [phone]               Allergies  Allergen Reactions   Penicillins Other (See Comments)    Unknown reaction Listed on  MAR    Procedures/Studies: IR Paracentesis  Result Date: 10/24/2022 INDICATION: Patient with history of cirrhosis and recurrent ascites. Request for therapeutic paracentesis. EXAM: ULTRASOUND GUIDED RIGHT LOWER QUADRANT PARACENTESIS MEDICATIONS: 1% plain lidocaine, 5 mL COMPLICATIONS: None immediate. PROCEDURE: Informed written consent was obtained from the patient after a discussion of the risks, benefits and alternatives to treatment. A timeout was performed prior to the initiation of the procedure. Initial ultrasound scanning demonstrates a small to moderate amount of ascites within the right lower abdominal quadrant. The right lower abdomen was prepped and draped in the usual sterile fashion. 1% lidocaine was used for local anesthesia. Following this, a 19 gauge, 7-cm, Yueh catheter was introduced. An ultrasound image was saved for documentation  purposes. The paracentesis was performed. The catheter was removed and a dressing was applied. The patient tolerated the procedure well without immediate post procedural complication. FINDINGS: A total of approximately 1 L of clear yellow fluid was removed. IMPRESSION: Successful ultrasound-guided paracentesis yielding 1 L of peritoneal fluid. Read by: Ascencion Dike PA-C Electronically Signed   By: Michaelle Birks M.D.   On: 10/24/2022 14:52   CT Angio Abd/Pel w/ and/or w/o  Result Date: 10/23/2022 CLINICAL DATA:  62 year old male with history of cirrhosis and lower gastrointestinal hemorrhage. EXAM: CTA ABDOMEN AND PELVIS WITHOUT AND WITH CONTRAST TECHNIQUE: Multidetector CT imaging of the abdomen and pelvis was performed using the standard protocol during bolus administration of intravenous contrast. Multiplanar reconstructed images and MIPs were obtained and reviewed to evaluate the vascular anatomy. RADIATION DOSE REDUCTION: This exam was performed according to the departmental dose-optimization program which includes automated exposure control, adjustment of the mA and/or kV according to patient size and/or use of iterative reconstruction technique. CONTRAST:  61m OMNIPAQUE IOHEXOL 350 MG/ML SOLN COMPARISON:  None Available. FINDINGS: VASCULAR Aorta: Normal caliber aorta without aneurysm, dissection, vasculitis or significant stenosis. Scattered fibrofatty and calcific atherosclerotic changes, most prominent at the infrarenal segment. Celiac: Patent without evidence of aneurysm, dissection, vasculitis or significant stenosis. SMA: Patent without evidence of aneurysm, dissection, vasculitis or significant stenosis. Renals: Single bilateral renal arteries are patent without evidence of aneurysm, dissection, vasculitis, fibromuscular dysplasia or significant stenosis. IMA: Patent without evidence of aneurysm, dissection, vasculitis or significant stenosis. Inflow: Patent without evidence of aneurysm, dissection,  vasculitis or significant stenosis. Proximal Outflow: Bilateral common femoral and visualized portions of the superficial and profunda femoral arteries are patent without evidence of aneurysm, dissection, vasculitis or significant stenosis. Veins: The hepatic veins are widely patent. The portal system is widely patent and normal in caliber. No evidence of portosystemic shunt or ectopic varices. The renal veins are patent bilaterally in standard anatomic configuration. No evidence of iliocaval thrombosis or anomaly. Review of the MIP images confirms the above findings. NON-VASCULAR Lower chest: Partially visualized small left pleural effusion with associated left basilar passive atelectasis. The heart is normal in size. No pericardial effusion. Coronary atherosclerotic calcifications. Hepatobiliary: Diffusely nodular contour. Diffusely shrunken size. No focal masses are visualized. The gallbladder is present and mildly distended without definite evidence of cholelithiasis or wall thickening. No intra or extrahepatic biliary ductal dilation. Pancreas: Unremarkable. No pancreatic ductal dilatation or surrounding inflammatory changes. Spleen: Normal in size without focal abnormality. Adrenals/Urinary Tract: Adrenal glands are unremarkable. Kidneys are normal, without renal calculi, focal lesion, or hydronephrosis. Bladder is unremarkable. Stomach/Bowel: Stomach is within normal limits. Unfortunately previously administered barium from the same day during swallow study opacifies the gastrointestinal tract from duodenum to the level of the splenic flexure.  There is streak artifact from the barium which severely limits CT evaluation of the bowel, specifically for evidence of hemorrhage. No evidence of bowel wall thickening, distention, or inflammatory changes. Lymphatic: No abdominopelvic lymphadenopathy. Reproductive: Prostate is unremarkable. Other: Moderate ascites. Small fat, fluid, and bowel containing umbilical  hernia without complicating features. Musculoskeletal: Advanced degenerative changes about the hips bilaterally, right greater than left. No acute osseous abnormality. IMPRESSION: 1. Severely limited evaluation for gastrointestinal hemorrhage due to recently ingested barium within the gastrointestinal tract spanning from the duodenum to the splenic flexure. Until barium clears from gastrointestinal tract, nuclear medicine tagged red blood cell scan may be more illustrative to localize etiology of hemorrhage, as clinically indicated. 2. Small left pleural effusion with associated left basilar passive atelectasis. 3. Morphologic changes of hepatic cirrhosis with moderate ascites. 4. Coronary and aortic atherosclerosis (ICD10-I70.0). 5. Severe right hip degenerative change. Ruthann Cancer, MD [facility] Electronically Signed   By: Ruthann Cancer M.D.   On: 10/23/2022 16:02   DG Swallowing Func-Speech Pathology  Result Date: 10/23/2022 Table formatting from the original result was not included. Modified Barium Swallow Study Patient Details Name: Saafir Wiesemann MRN: [id number] Date of Birth: Nov 03, 1960 Today's Date: 10/23/2022 HPI/PMH: HPI: Cable Fronheiser is a 62 y.o. male who [facility] ED from [facility] via EMS with left sided weakness, speech changes, wheezing. PMH/PSH includes prior CVA, subarachnoid hemorrhage, cirrhosis, hep C, hx of etOH abuse, dementia, COPD.  Pt known to SLP from prior admissions, with baseline dysarthria and oral dysphagia. He was most recently seen by SLP on 04/13/22 with recommendations for Dysphagia 1 solids and thin liquids. NPO with Cortrak, can't get PEG due to acities. Repeat MBS for possible liquid upgrade. Clinical Impression: Clinical Impression: Pt's oral phase of swallow has improved since [facility] 10/02/22. He continues with decreased bolus control however there was less escape to floor of mandible. Lingual residue present after  all trials but volume was decreased from prior studies. Regular solid texture not given during study however therapist will work with pt at bedside to advance from puree. There was no aspiration however laryngeal penetration present with thin while using a straw. Decreased laryngeal elevation prevented early closure with barium entering vestibule prior to vestibule closure. Subsequent cup sips decreased velocity and prevented penetration consistently. Swallows were initiated at valleculae and several times in pyriform sinuses.Tongue base retraction is mildly reduced with mild valleculae and pyriform sinus residue with thin and nectar (no significant residue with puree or honey thick). Therapist recommends upgrade to thin liquids via CUP only, NO straws, sitting in upright position, small sips, cue to clear throat and pills crushed. Continue puree and SLP will work with pt in attempts to upgrade to chewable solids. Factors that may increase risk of adverse event in presence of aspiration ([place] 2021): Factors that may increase risk of adverse event in presence of aspiration ([place] 2021): Reduced cognitive function; Frail or deconditioned Recommendations/Plan: Swallowing Evaluation Recommendations Swallowing Evaluation Recommendations Recommendations: PO diet PO Diet Recommendation: Dysphagia 1 (Pureed); Thin liquids (Level 0) Liquid Administration via: Cup; No straw Medication Administration: Crushed with puree Supervision: Staff to assist with self-feeding; Full assist for feeding Swallowing strategies  : Slow rate; Small bites/sips; Clear throat intermittently Postural changes: Position pt fully upright for meals Oral care recommendations: Oral care BID (2x/day) Treatment Plan Treatment Plan Treatment recommendations: Therapy as outlined in treatment plan below Follow-up recommendations: Skilled nursing-short term rehab (<3 hours/day) Functional status  assessment: Patient has had a recent  decline in their functional status and demonstrates the ability to make significant improvements in function in a reasonable and predictable amount of time. Treatment frequency: Min 1x/week Treatment duration: 3 weeks Interventions: Compensatory techniques; Diet toleration management by SLP; Trials of upgraded texture/liquids Recommendations Recommendations for follow up therapy are one component of a multi-disciplinary discharge planning process, led by the attending physician.  Recommendations may be updated based on patient status, additional functional criteria and insurance authorization. Assessment: Orofacial Exam: Orofacial Exam Oral Cavity: Oral Hygiene: WFL Oral Cavity - Dentition: Edentulous Orofacial Anatomy: WFL Oral Motor/Sensory Function: WFL Anatomy: Anatomy: Suspected cervical osteophytes Thin Liquids: Thin Liquids (Level 0) Thin Liquids : Impaired Bolus delivery method: Spoon; Cup; Straw Thin Liquid - Impairment: Oral Impairment; Pharyngeal impairment Lip Closure: No labial escape Tongue control during bolus hold: Escape to lateral buccal cavity/floor of mouth Bolus transport/lingual motion: Brisk tongue motion Oral residue: Residue collection on oral structures Location of oral residue : Tongue Initiation of swallow : Valleculae Soft palate elevation: No bolus between soft palate (SP)/pharyngeal wall (PW) Laryngeal elevation: Complete superior movement of thyroid cartilage with complete approximation of arytenoids to epiglottic petiole Anterior hyoid excursion: Complete Epiglottic movement: Complete Laryngeal vestibule closure: Complete, no air/contrast in laryngeal vestibule Pharyngeal stripping wave : Present - complete Pharyngeal contraction (A/P view only): N/A Pharyngoesophageal segment opening: Complete distension and complete duration, no obstruction of flow Tongue base retraction: Trace column of contrast or air between tongue base and PPW Pharyngeal residue: Trace residue within or on  pharyngeal structures Location of pharyngeal residue: Valleculae; Pyriform sinuses Penetration/Aspiration Scale (PAS) score: 1.  Material does not enter airway  Mildly Thick Liquids: Mildly thick liquids (Level 2, nectar thick) Mildly thick liquids (Level 2, nectar thick): Impaired Bolus delivery method: Spoon; Cup Mildly Thick Liquid - Impairment: Oral Impairment; Pharyngeal impairment Lip Closure: No labial escape Tongue control during bolus hold: Escape to lateral buccal cavity/floor of mouth Bolus transport/lingual motion: Brisk tongue motion Oral residue: Residue collection on oral structures Location of oral residue : Tongue Initiation of swallow : Pyriform sinuses Soft palate elevation: No bolus between soft palate (SP)/pharyngeal wall (PW) Laryngeal elevation: Complete superior movement of thyroid cartilage with complete approximation of arytenoids to epiglottic petiole Anterior hyoid excursion: Complete Epiglottic movement: Complete Laryngeal vestibule closure: Complete, no air/contrast in laryngeal vestibule Pharyngeal stripping wave : Present - complete Pharyngeal contraction (A/P view only): N/A Pharyngoesophageal segment opening: Complete distension and complete duration, no obstruction of flow Tongue base retraction: Trace column of contrast or air between tongue base and PPW Pharyngeal residue: Trace residue within or on pharyngeal structures Location of pharyngeal residue: Pyriform sinuses Penetration/Aspiration Scale (PAS) score: 1.  Material does not enter airway  Moderately Thick Liquids: Moderately thick liquids (Level 3, honey thick) Moderately thick liquids (Level 3, honey thick): Impaired Bolus delivery method: Spoon Moderately Thick Liquid - Impairment: Oral Impairment; Pharyngeal impairment Lip Closure: No labial escape Tongue control during bolus hold: Cohesive bolus between tongue to palatal seal Bolus transport/lingual motion: Brisk tongue motion Oral residue: Residue collection on oral  structures Location of oral residue : Tongue Initiation of swallow : Valleculae Soft palate elevation: No bolus between soft palate (SP)/pharyngeal wall (PW) Laryngeal elevation: Complete superior movement of thyroid cartilage with complete approximation of arytenoids to epiglottic petiole Anterior hyoid excursion: Complete Epiglottic movement: Complete Laryngeal vestibule closure: Complete, no air/contrast in laryngeal vestibule Pharyngeal stripping wave : Present - complete Pharyngeal contraction (A/P view only): N/A  Pharyngoesophageal segment opening: Complete distension and complete duration, no obstruction of flow Tongue base retraction: Trace column of contrast or air between tongue base and PPW Pharyngeal residue: Trace residue within or on pharyngeal structures Location of pharyngeal residue: Tongue base Penetration/Aspiration Scale (PAS) score: 1.  Material does not enter airway  Puree: Puree Puree: Impaired Puree - Impairment: Oral Impairment; Pharyngeal impairment Lip Closure: No labial escape Bolus transport/lingual motion: Slow tongue motion Oral residue: Trace residue lining oral structures Location of oral residue : Tongue Initiation of swallow: Valleculae Soft palate elevation: No bolus between soft palate (SP)/pharyngeal wall (PW) Laryngeal elevation: Complete superior movement of thyroid cartilage with complete approximation of arytenoids to epiglottic petiole Anterior hyoid excursion: Complete Epiglottic movement: Complete Laryngeal vestibule closure: Complete, no air/contrast in laryngeal vestibule Pharyngeal stripping wave : Present - complete Pharyngeal contraction (A/P view only): N/A Pharyngoesophageal segment opening: Complete distension and complete duration, no obstruction of flow Tongue base retraction: Trace column of contrast or air between tongue base and PPW Pharyngeal residue: Trace residue within or on pharyngeal structures Location of pharyngeal residue: Tongue base  Penetration/Aspiration Scale (PAS) score: 1.  Material does not enter airway Solid: Solid Solid: Not Tested Pill: Pill Pill: Not Tested Compensatory Strategies: Compensatory Strategies Compensatory strategies: Yes Straw: Ineffective (penetrated with straw) Ineffective Straw: Thin liquid (Level 0)   General Information: Caregiver present: No  Diet Prior to this Study: Dysphagia 1 (pureed); Moderately thick liquids (Level 3, honey thick)   Temperature : Normal   Respiratory Status: WFL   Supplemental O2: None (Room air)   History of Recent Intubation: No  Behavior/Cognition: Alert; Cooperative; Pleasant mood; Requires cueing Self-Feeding Abilities: Needs assist with self-feeding Baseline vocal quality/speech: Hypophonia/low volume No data recorded Volitional Swallow: -- (difficulty initiating) No data recorded Goal Planning: Prognosis for improved oropharyngeal function: Good Barriers to Reach Goals: Cognitive deficits; Severity of deficits No data recorded Patient/Family Stated Goal: continuing attempts as patient cannot have PEG and will not be able to have long term Cortrak use Consulted and agree with results and recommendations: Pt unable/family or caregiver not available; Physician Pain: Pain Assessment Pain Assessment: Faces Pain Score: 0 Faces Pain Scale: 0 Breathing: 0 Negative Vocalization: 0 Facial Expression: 0 Body Language: 0 Consolability: 0 PAINAD Score: 0 Facial Expression: 0 Body Movements: 0 Muscle Tension: 0 Compliance with ventilator (intubated pts.): N/A Vocalization (extubated pts.): 0 CPOT Total: 0 Pain Location: generalized with mobility and hygiene assist Pain Descriptors / Indicators: Discomfort; Grimacing Pain Intervention(s): Monitored during session; Repositioned; Limited activity within patient's tolerance End of Session: Start Time:SLP Start Time (ACUTE ONLY): 1108 Stop Time: SLP Stop Time (ACUTE ONLY): 1120 Time Calculation:SLP Time Calculation (min) (ACUTE ONLY): 12 min Charges: SLP  Evaluations $ SLP Speech Visit: 1 Visit SLP Evaluations $BSS Swallow: 1 Procedure $MBS Swallow: 1 Procedure $ SLP EVAL LANGUAGE/SOUND PRODUCTION: 1 Procedure $Swallowing Treatment: 1 Procedure $Speech Treatment for Individual: 1 Procedure SLP visit diagnosis: SLP Visit Diagnosis: Dysphagia, oropharyngeal phase (R13.12) Past Medical History: Past Medical History: Diagnosis Date  Cirrhosis ([place])   COPD (chronic obstructive pulmonary disease) ([place])   Gastric ulcer   Stroke [facility])  Past Surgical History: Past Surgical History: Procedure Laterality Date  IR PARACENTESIS  09/19/2022  IR PARACENTESIS  09/25/2022  IR PARACENTESIS  10/03/2022  IR PARACENTESIS  10/11/2022 [place] Siren 10/23/2022, 12:33 PM  IR Paracentesis  Result Date: 10/11/2022 INDICATION: Patient with a history of cirrhosis with recurrent ascites. Interventional radiology asked to perform a therapeutic paracentesis. EXAM:  ULTRASOUND GUIDED PARACENTESIS MEDICATIONS: 1% lidocaine 10 mL COMPLICATIONS: None immediate. PROCEDURE: Informed written consent was obtained from the patient after a discussion of the risks, benefits and alternatives to treatment. A timeout was performed prior to the initiation of the procedure. Initial ultrasound scanning demonstrates a large amount of ascites within the right lower abdominal quadrant. The right lower abdomen was prepped and draped in the usual sterile fashion. 1% lidocaine was used for local anesthesia. Following this, a 19 gauge, 7-cm, Yueh catheter was introduced. An ultrasound image was saved for documentation purposes. The paracentesis was performed. The catheter was removed and a dressing was applied. The patient tolerated the procedure well without immediate post procedural complication. FINDINGS: A total of approximately 3 L of clear yellow fluid was removed. IMPRESSION: Successful ultrasound-guided paracentesis yielding 3 liters of peritoneal fluid. Read by: Soyla Dryer, NP Electronically Signed   By:  Markus Daft M.D.   On: 10/11/2022 18:54   US Abdomen Complete  Result Date: 10/08/2022 CLINICAL DATA:  [id number] Ascites [id number] AKI (acute kidney injury) [facility]) [id number] Cirrhosis ([place]) [id number] EXAM: ABDOMEN ULTRASOUND COMPLETE COMPARISON:  July 01, 2022 October 12, 2018 FINDINGS: Gallbladder: No gallstones visualized. Gallbladder is decompressed wall is diffusely thickened, consistent with fluid overload state. No sonographic Murphy sign noted by sonographer. Common bile duct: Diameter: Visualized portion measures 3 mm, within normal limits Liver: No focal lesion identified. Coarsened increased hepatic parenchymal echogenicity. Nodular liver contours. Portal vein is grossly patent on color Doppler imaging with normal direction of blood flow towards the liver. IVC: Not visualized secondary to shadowing bowel gas and ascites Pancreas: Not visualized secondary to shadowing bowel gas. Spleen: Size and appearance within normal limits. Right Kidney: Length: 8.0 cm. Echogenicity appears mildly increased. No mass or hydronephrosis visualized. Left Kidney: Length: 8.2 cm. Echogenicity appears mildly increased. No mass or hydronephrosis visualized. Abdominal aorta: Not visualized secondary to shadowing bowel gas. Other findings: Large volume ascites. IMPRESSION: 1. Cirrhotic liver morphology without focal lesion identified sonographically. 2. Large volume ascites. 3. Gallbladder wall thickening is consistent with fluid overload state. 4. Mildly increased echogenicity of the bilateral kidneys could reflect underlying medical renal disease versus artifact secondary to extensive ascites. No hydronephrosis. Electronically Signed   By: Valentino Saxon M.D.   On: 10/08/2022 15:17   IR Paracentesis  Result Date: 10/03/2022 INDICATION: Recurrent ascites secondary to cirrhosis. Request for therapeutic paracentesis. EXAM: ULTRASOUND GUIDED PARACENTESIS MEDICATIONS: 1% lidocaine 8 mL COMPLICATIONS: None immediate.  PROCEDURE: Informed written consent was obtained from the patient after a discussion of the risks, benefits and alternatives to treatment. A timeout was performed prior to the initiation of the procedure. Initial ultrasound scanning demonstrates a large amount of ascites within the left lateral abdomen. The left lateral abdomen was prepped and draped in the usual sterile fashion. 1% lidocaine was used for local anesthesia. Following this, a 19 gauge, 7-cm, Yueh catheter was introduced. An ultrasound image was saved for documentation purposes. The paracentesis was performed. The catheter was removed and a dressing was applied. The patient tolerated the procedure well without immediate post procedural complication. FINDINGS: A total of approximately 3.1 L of hazy yellow fluid was removed. IMPRESSION: Successful ultrasound-guided paracentesis yielding 3.1 liters of peritoneal fluid. Procedure performed by: Gareth Eagle, PA-C Electronically Signed   By: Ruthann Cancer M.D.   On: 10/03/2022 16:07   DG Swallowing Func-Speech Pathology  Result Date: 10/02/2022 Table formatting from the original result was not included. Objective Swallowing Evaluation: Type of Study:  MBS-Modified Barium Swallow Study  Patient Details Name: Fredirick Button MRN: [id number] Date of Birth: 27-Jun-1961 Today's Date: 10/02/2022 Time: SLP Start Time (ACUTE ONLY): 1108 -SLP Stop Time (ACUTE ONLY): 1131 SLP Time Calculation (min) (ACUTE ONLY): 23 min Past Medical History: Past Medical History: Diagnosis Date  Cirrhosis ([place])   COPD (chronic obstructive pulmonary disease) ([place])   Gastric ulcer   Stroke [facility])  Past Surgical History: Past Surgical History: Procedure Laterality Date  IR PARACENTESIS  09/19/2022  IR PARACENTESIS  09/25/2022 HPI: Nethan Mcnall is a 62 y.o. male who presented to ED from [facility] via EMS with left sided weakness, speech changes, wheezing. PMH/PSH includes prior CVA, subarachnoid hemorrhage, cirrhosis, hep C, hx of etOH abuse,  dementia, COPD.  Pt known to SLP from prior admissions, with baseline dysarthria and oral dysphagia. He was most recently seen by SLP on 04/13/22 with recommendations for Dysphagia 1 solids and thin liquids. NPO with Cortrak, can't get PEG due to acities. ST has seen him for therapy consistently with improvements and he is now appropriate for his second MBS.  Subjective: awake, alert, cooperative  Recommendations for follow up therapy are one component of a multi-disciplinary discharge planning process, led by the attending physician.  Recommendations may be updated based on patient status, additional functional criteria and insurance authorization. Assessment / Plan / Recommendation   10/02/2022  12:00 PM Clinical Impressions Clinical Impression Pt's oropharyangeal function has improved from previous study. He exhibits a primarily oral phase dysphagia with significant impairments. in lingual control, cohesion and strength which results in delayed transit and significant residue on lingual surface and base of tongue. Empty spoon presentations and cues to move tongue were mild-moderately effective. His base on tongue and pharyngeal contraction is weak leaving residue on base that he does reduce with spontaneous multiple swallows. Several occasions material reached the valleculae before swallow was initiated with puree. As the study progressed the timliness and strength of his swallow appeared to improve somewhat. Due to his oral delays the number of pharyngeal swallows was a bit limited. Fortunately he protected his airway and there was no penetration or aspiration present as well as only trace-minimum residue in his valleculae. Esophageal scan was unremarkable. It is recommended that he initiate a Dys 1 (puree), honey thick liquids, with feeding assistant to  give additional time to swallow and check for pocketing/anterior spillage. Position upright and limit distractions. Meds can be given crushed in puree or can use  Cortrak. SLP Visit Diagnosis Dysphagia, oropharyngeal phase (R13.12) Impact on safety and function Mild aspiration risk;Risk for inadequate nutrition/hydration     10/02/2022  12:00 PM Treatment Recommendations Treatment Recommendations Therapy as outlined in treatment plan below     10/02/2022  12:00 PM Prognosis Prognosis for Safe Diet Advancement --   10/02/2022  12:00 PM Diet Recommendations SLP Diet Recommendations Dysphagia 1 (Puree) solids;Honey thick liquids Liquid Administration via Cup;Spoon Medication Administration Crushed with puree Compensations Slow rate;Small sips/bites;Lingual sweep for clearance of pocketing;Monitor for anterior loss;Minimize environmental distractions;Follow solids with liquid Postural Changes Seated upright at 90 degrees     10/02/2022  12:00 PM Other Recommendations Oral Care Recommendations Oral care BID Follow Up Recommendations Skilled nursing-short term rehab (<3 hours/day) Functional Status Assessment Patient has had a recent decline in their functional status and demonstrates the ability to make significant improvements in function in a reasonable and predictable amount of time.   10/02/2022  12:00 PM Frequency and Duration  Speech Therapy Frequency (ACUTE ONLY) min 2x/week Treatment  Duration 2 weeks     10/02/2022  12:00 PM Oral Phase Oral Phase Impaired Oral - Honey Teaspoon Weak lingual manipulation;Lingual pumping;Incomplete tongue to palate contact;Reduced posterior propulsion;Holding of bolus;Lingual/palatal residue;Delayed oral transit;Decreased bolus cohesion;Right anterior bolus loss Oral - Nectar Teaspoon NT Oral - Thin Teaspoon NT Oral - Thin Cup NT Oral - Puree Weak lingual manipulation;Lingual pumping;Incomplete tongue to palate contact;Reduced posterior propulsion;Holding of bolus;Lingual/palatal residue;Delayed oral transit;Decreased bolus cohesion    10/02/2022  12:00 PM Pharyngeal Phase Pharyngeal Phase Impaired Pharyngeal- Honey Teaspoon Reduced pharyngeal  peristalsis;Reduced tongue base retraction;Pharyngeal residue - valleculae Pharyngeal- Nectar Teaspoon NT Pharyngeal- Thin Teaspoon NT Pharyngeal- Thin Cup NT Pharyngeal- Puree Delayed swallow initiation-vallecula;Reduced pharyngeal peristalsis;Reduced tongue base retraction;Pharyngeal residue - valleculae    10/02/2022  12:00 PM Cervical Esophageal Phase  Cervical Esophageal Phase WFL [place] Siren 10/02/2022, 12:31 PM                     DG Chest Port 1 View  Result Date: 10/02/2022 CLINICAL DATA:  Tachypnea EXAM: PORTABLE CHEST 1 VIEW COMPARISON:  09/10/2022 FINDINGS: Cardiac shadow is stable. Aortic calcifications are seen. Lungs are well aerated bilaterally. No focal infiltrate is noted. Feeding catheter extends into the stomach. No bony abnormality is noted. IMPRESSION: No active disease. Electronically Signed   By: Inez Catalina M.D.   On: 10/02/2022 00:27   US Paracentesis  Result Date: 09/29/2022 INDICATION: Patient with history of cirrhosis, recurrent ascites. Request is made for therapeutic paracentesis. EXAM: ULTRASOUND GUIDED THERAPEUTIC PARACENTESIS MEDICATIONS: 10 mL 1% lidocaine COMPLICATIONS: None immediate. PROCEDURE: Informed written consent was obtained from the patient after a discussion of the risks, benefits and alternatives to treatment. A timeout was performed prior to the initiation of the procedure. Initial ultrasound scanning demonstrates a large amount of ascites within the left lateral abdomen. The left lateral abdomen was prepped and draped in the usual sterile fashion. 1% lidocaine was used for local anesthesia. Following this, a 19 gauge, 7-cm, Yueh catheter was introduced. An ultrasound image was saved for documentation purposes. The paracentesis was performed. The catheter was removed and a dressing was applied. The patient tolerated the procedure well without immediate post procedural complication. FINDINGS: A total of approximately 4.2 liters of blood-tinged fluid was  removed. IMPRESSION: Successful ultrasound-guided paracentesis yielding 4.2 liters of peritoneal fluid. Read by: Brynda Greathouse PA-C Electronically Signed   By: Sandi Mariscal M.D.   On: 09/29/2022 15:28     Subjective: No acute issues/events overnight   Discharge Exam: Vitals:   10/26/22 0322 10/26/22 0731  BP: (!) 148/91 117/63  Pulse: 85 69  Resp: 18 18  Temp: 98 F (36.7 C) 98 F (36.7 C)  SpO2: 100% 93%   Vitals:   10/25/22 2053 10/25/22 2350 10/26/22 0322 10/26/22 0731  BP: (!) 154/79 (!) 154/84 (!) 148/91 117/63  Pulse: 92 82 85 69  Resp: '18 18 18 18  '$ Temp: 98.6 F (37 C) 98.2 F (36.8 C) 98 F (36.7 C) 98 F (36.7 C)  TempSrc:  Oral Oral Oral  SpO2: 100% 100% 100% 93%  Weight:      Height:        General appearance: Awake alert.  In no distress, answers some questions appropriately but oriented to person only Resp: Clear to auscultation bilaterally.  Normal effort Cardio: S1-S2 is normal regular.  No S3-S4.  No rubs murmurs or bruit GI: Abdomen is soft.  Nontender nondistended.  Bowel sounds are present normal.  No masses  organomegaly Extremities: No edema.     The results of significant diagnostics from this hospitalization (including imaging, microbiology, ancillary and laboratory) are listed below for reference.     Microbiology: No results found for this or any previous visit (from the past 240 hour(s)).   Labs: BNP (last 3 results) Recent Labs    09/10/22 1000  BNP [id number]*   Basic Metabolic Panel: Recent Labs  Lab 10/21/22 0307 10/22/22 0400 10/23/22 0349 10/24/22 0801 10/25/22 0511  NA 145 143 140 140 140  K 3.9 4.2 3.9 3.9 3.8  CL 115* 112* 111 110 112*  CO2 23 21* '24 23 25  '$ GLUCOSE 94 80 95 89 86  BUN 37* 34* 29* 26* 27*  CREATININE 1.56* 1.59* 1.51* 1.44* 1.40*  CALCIUM 7.7* 7.8* 7.4* 7.5* 7.5*   Liver Function Tests: No results for input(s): "AST", "ALT", "ALKPHOS", "BILITOT", "PROT", "ALBUMIN" in the last 168 hours. No results  for input(s): "LIPASE", "AMYLASE" in the last 168 hours. No results for input(s): "AMMONIA" in the last 168 hours. CBC: Recent Labs  Lab 10/20/22 0440 10/21/22 2336 10/22/22 1230 10/23/22 0349 10/24/22 0801  WBC 7.7  --  6.4 7.3 8.6  HGB 9.8* 9.6* 9.2* 8.6* 9.4*  HCT 31.1* 29.2* 29.5* 26.2* 30.1*  MCV 108.7*  --  111.3* 105.2* 109.1*  PLT 81*  --  77* 67* 77*   Cardiac Enzymes: No results for input(s): "CKTOTAL", "CKMB", "CKMBINDEX", "TROPONINI" in the last 168 hours. BNP: Invalid input(s): "POCBNP" CBG: Recent Labs  Lab 10/25/22 1519 10/25/22 2054 10/25/22 2351 10/26/22 0323 10/26/22 0731  GLUCAP 90 106* 84 104* 86   D-Dimer No results for input(s): "DDIMER" in the last 72 hours. Hgb A1c No results for input(s): "HGBA1C" in the last 72 hours. Lipid Profile No results for input(s): "CHOL", "HDL", "LDLCALC", "TRIG", "CHOLHDL", "LDLDIRECT" in the last 72 hours. Thyroid function studies No results for input(s): "TSH", "T4TOTAL", "T3FREE", "THYROIDAB" in the last 72 hours.  Invalid input(s): "FREET3" Anemia work up No results for input(s): "VITAMINB12", "FOLATE", "FERRITIN", "TIBC", "IRON", "RETICCTPCT" in the last 72 hours. Urinalysis    Component Value Date/Time   COLORURINE AMBER (A) 10/02/2022 0521   APPEARANCEUR HAZY (A) 10/02/2022 0521   LABSPEC 1.015 10/02/2022 0521   PHURINE 5.0 10/02/2022 0521   GLUCOSEU NEGATIVE 10/02/2022 0521   HGBUR NEGATIVE 10/02/2022 0521   BILIRUBINUR NEGATIVE 10/02/2022 0521   KETONESUR NEGATIVE 10/02/2022 0521   PROTEINUR NEGATIVE 10/02/2022 0521   NITRITE NEGATIVE 10/02/2022 0521   LEUKOCYTESUR NEGATIVE 10/02/2022 0521   Sepsis Labs Recent Labs  Lab 10/20/22 0440 10/22/22 1230 10/23/22 0349 10/24/22 0801  WBC 7.7 6.4 7.3 8.6   Microbiology No results found for this or any previous visit (from the past 240 hour(s)).   Time coordinating discharge: Over 30 minutes  SIGNED:   Little Ishikawa, DO [facility] 10/26/2022, 7:59 AM Pager   If 7PM-7AM, please contact night-coverage [email or web address]

## 2022-10-26 NOTE — Plan of Care (Signed)

## 2022-10-28 ENCOUNTER — Observation Stay (HOSPITAL_COMMUNITY): Payer: Medicaid Other

## 2022-10-28 ENCOUNTER — Inpatient Hospital Stay (HOSPITAL_COMMUNITY)
Admission: EM | Admit: 2022-10-28 | Discharge: 2022-11-02 | DRG: 378 | Disposition: A | Discharge status: Skilled Nursing Facility | Payer: Medicaid Other | Source: Skilled Nursing Facility | Attending: Internal Medicine | Admitting: Internal Medicine

## 2022-10-28 ENCOUNTER — Emergency Department (HOSPITAL_COMMUNITY): Payer: Medicaid Other

## 2022-10-28 ENCOUNTER — Encounter (HOSPITAL_COMMUNITY): Payer: Self-pay | Admitting: Emergency Medicine

## 2022-10-28 DIAGNOSIS — K5791 Diverticulosis of intestine, part unspecified, without perforation or abscess with bleeding: Principal | ICD-10-CM | POA: Diagnosis present

## 2022-10-28 DIAGNOSIS — D62 Acute posthemorrhagic anemia: Secondary | ICD-10-CM | POA: Diagnosis present

## 2022-10-28 DIAGNOSIS — E876 Hypokalemia: Secondary | ICD-10-CM | POA: Diagnosis present

## 2022-10-28 DIAGNOSIS — F0390 Unspecified dementia without behavioral disturbance: Secondary | ICD-10-CM | POA: Diagnosis present

## 2022-10-28 DIAGNOSIS — Z79899 Other long term (current) drug therapy: Secondary | ICD-10-CM

## 2022-10-28 DIAGNOSIS — I69391 Dysphagia following cerebral infarction: Secondary | ICD-10-CM

## 2022-10-28 DIAGNOSIS — D539 Nutritional anemia, unspecified: Secondary | ICD-10-CM | POA: Diagnosis not present

## 2022-10-28 DIAGNOSIS — E870 Hyperosmolality and hypernatremia: Secondary | ICD-10-CM | POA: Diagnosis present

## 2022-10-28 DIAGNOSIS — Z6820 Body mass index (BMI) 20.0-20.9, adult: Secondary | ICD-10-CM

## 2022-10-28 DIAGNOSIS — N400 Enlarged prostate without lower urinary tract symptoms: Secondary | ICD-10-CM | POA: Diagnosis present

## 2022-10-28 DIAGNOSIS — L89222 Pressure ulcer of left hip, stage 2: Secondary | ICD-10-CM | POA: Diagnosis present

## 2022-10-28 DIAGNOSIS — F17210 Nicotine dependence, cigarettes, uncomplicated: Secondary | ICD-10-CM | POA: Diagnosis present

## 2022-10-28 DIAGNOSIS — K625 Hemorrhage of anus and rectum: Secondary | ICD-10-CM | POA: Diagnosis not present

## 2022-10-28 DIAGNOSIS — R636 Underweight: Secondary | ICD-10-CM | POA: Diagnosis present

## 2022-10-28 DIAGNOSIS — N179 Acute kidney failure, unspecified: Secondary | ICD-10-CM | POA: Diagnosis present

## 2022-10-28 DIAGNOSIS — K3189 Other diseases of stomach and duodenum: Secondary | ICD-10-CM | POA: Diagnosis present

## 2022-10-28 DIAGNOSIS — R4701 Aphasia: Secondary | ICD-10-CM | POA: Diagnosis present

## 2022-10-28 DIAGNOSIS — R1312 Dysphagia, oropharyngeal phase: Secondary | ICD-10-CM

## 2022-10-28 DIAGNOSIS — E878 Other disorders of electrolyte and fluid balance, not elsewhere classified: Secondary | ICD-10-CM | POA: Diagnosis present

## 2022-10-28 DIAGNOSIS — D6959 Other secondary thrombocytopenia: Secondary | ICD-10-CM | POA: Diagnosis present

## 2022-10-28 DIAGNOSIS — K922 Gastrointestinal hemorrhage, unspecified: Principal | ICD-10-CM

## 2022-10-28 DIAGNOSIS — K7460 Unspecified cirrhosis of liver: Secondary | ICD-10-CM | POA: Diagnosis present

## 2022-10-28 DIAGNOSIS — Z7951 Long term (current) use of inhaled steroids: Secondary | ICD-10-CM

## 2022-10-28 DIAGNOSIS — R1310 Dysphagia, unspecified: Secondary | ICD-10-CM

## 2022-10-28 DIAGNOSIS — K7031 Alcoholic cirrhosis of liver with ascites: Secondary | ICD-10-CM | POA: Diagnosis not present

## 2022-10-28 DIAGNOSIS — I10 Essential (primary) hypertension: Secondary | ICD-10-CM | POA: Diagnosis present

## 2022-10-28 DIAGNOSIS — I69354 Hemiplegia and hemiparesis following cerebral infarction affecting left non-dominant side: Secondary | ICD-10-CM

## 2022-10-28 DIAGNOSIS — R1319 Other dysphagia: Secondary | ICD-10-CM | POA: Diagnosis present

## 2022-10-28 DIAGNOSIS — J449 Chronic obstructive pulmonary disease, unspecified: Secondary | ICD-10-CM | POA: Diagnosis present

## 2022-10-28 DIAGNOSIS — D731 Hypersplenism: Secondary | ICD-10-CM | POA: Diagnosis present

## 2022-10-28 DIAGNOSIS — I130 Hypertensive heart and chronic kidney disease with heart failure and stage 1 through stage 4 chronic kidney disease, or unspecified chronic kidney disease: Secondary | ICD-10-CM | POA: Diagnosis present

## 2022-10-28 DIAGNOSIS — D696 Thrombocytopenia, unspecified: Secondary | ICD-10-CM | POA: Diagnosis present

## 2022-10-28 DIAGNOSIS — N1832 Chronic kidney disease, stage 3b: Secondary | ICD-10-CM | POA: Diagnosis present

## 2022-10-28 DIAGNOSIS — L89626 Pressure-induced deep tissue damage of left heel: Secondary | ICD-10-CM | POA: Diagnosis present

## 2022-10-28 LAB — COMPREHENSIVE METABOLIC PANEL
ALT: 41 U/L (ref 0–44)
AST: 82 U/L — ABNORMAL HIGH (ref 15–41)
Albumin: 1.7 g/dL — ABNORMAL LOW (ref 3.5–5.0)
Alkaline Phosphatase: 116 U/L (ref 38–126)
Anion gap: 7 (ref 5–15)
BUN: 32 mg/dL — ABNORMAL HIGH (ref 8–23)
CO2: 22 mmol/L (ref 22–32)
Calcium: 7.7 mg/dL — ABNORMAL LOW (ref 8.9–10.3)
Chloride: 117 mmol/L — ABNORMAL HIGH (ref 98–111)
Creatinine, Ser: 1.91 mg/dL — ABNORMAL HIGH (ref 0.61–1.24)
GFR, Estimated: 39 mL/min — ABNORMAL LOW (ref >60)
Glucose, Bld: 101 mg/dL — ABNORMAL HIGH (ref 70–99)
Potassium: 4.4 mmol/L (ref 3.5–5.1)
Sodium: 146 mmol/L — ABNORMAL HIGH (ref 135–145)
Total Bilirubin: 1 mg/dL (ref 0.3–1.2)
Total Protein: 6.2 g/dL — ABNORMAL LOW (ref 6.5–8.1)

## 2022-10-28 LAB — CBC WITH DIFFERENTIAL/PLATELET
Abs Immature Granulocytes: 0.03 10*3/uL (ref 0.00–0.07)
Basophils Absolute: 0 10*3/uL (ref 0.0–0.1)
Basophils Relative: 1 %
Eosinophils Absolute: 0.2 10*3/uL (ref 0.0–0.5)
Eosinophils Relative: 3 %
HCT: 28.2 % — ABNORMAL LOW (ref 39.0–52.0)
Hemoglobin: 9.1 g/dL — ABNORMAL LOW (ref 13.0–17.0)
Immature Granulocytes: 1 %
Lymphocytes Relative: 23 %
Lymphs Abs: 1.6 10*3/uL (ref 0.7–4.0)
MCH: 34.6 pg — ABNORMAL HIGH (ref 26.0–34.0)
MCHC: 32.3 g/dL (ref 30.0–36.0)
MCV: 107.2 fL — ABNORMAL HIGH (ref 80.0–100.0)
Monocytes Absolute: 0.9 10*3/uL (ref 0.1–1.0)
Monocytes Relative: 14 %
Neutro Abs: 3.9 10*3/uL (ref 1.7–7.7)
Neutrophils Relative %: 58 %
Platelets: 74 10*3/uL — ABNORMAL LOW (ref 150–400)
RBC: 2.63 MIL/uL — ABNORMAL LOW (ref 4.22–5.81)
RDW: 16.8 % — ABNORMAL HIGH (ref 11.5–15.5)
WBC: 6.6 10*3/uL (ref 4.0–10.5)
nRBC: 0 % (ref 0.0–0.2)

## 2022-10-28 LAB — I-STAT CHEM 8, ED
BUN: 36 mg/dL — ABNORMAL HIGH (ref 8–23)
Calcium, Ion: 1.08 mmol/L — ABNORMAL LOW (ref 1.15–1.40)
Chloride: 116 mmol/L — ABNORMAL HIGH (ref 98–111)
Creatinine, Ser: 2 mg/dL — ABNORMAL HIGH (ref 0.61–1.24)
Glucose, Bld: 92 mg/dL (ref 70–99)
HCT: 25 % — ABNORMAL LOW (ref 39.0–52.0)
Hemoglobin: 8.5 g/dL — ABNORMAL LOW (ref 13.0–17.0)
Potassium: 4.1 mmol/L (ref 3.5–5.1)
Sodium: 151 mmol/L — ABNORMAL HIGH (ref 135–145)
TCO2: 25 mmol/L (ref 22–32)

## 2022-10-28 LAB — TYPE AND SCREEN
ABO/RH(D): O POS
Antibody Screen: NEGATIVE

## 2022-10-28 MED ORDER — LACTULOSE 10 GM/15ML PO SOLN
10.0000 g | Freq: Two times a day (BID) | ORAL | Status: DC
  Administered 2022-10-28 – 2022-11-01 (×8): 10 g via ORAL
  Filled 2022-10-28 (×5): qty 30
  Filled 2022-10-28: qty 15
  Filled 2022-10-28 (×3): qty 30

## 2022-10-28 MED ORDER — PANTOPRAZOLE SODIUM 40 MG PO TBEC
40.0000 mg | DELAYED_RELEASE_TABLET | Freq: Two times a day (BID) | ORAL | Status: DC
  Administered 2022-10-28 – 2022-11-02 (×8): 40 mg via ORAL
  Filled 2022-10-28 (×8): qty 1

## 2022-10-28 MED ORDER — FOLIC ACID 1 MG PO TABS
1.0000 mg | ORAL_TABLET | Freq: Every day | ORAL | Status: DC
  Administered 2022-10-30 – 2022-11-02 (×4): 1 mg via ORAL
  Filled 2022-10-28 (×4): qty 1

## 2022-10-28 MED ORDER — GERHARDT'S BUTT CREAM: 1.0000 | TOPICAL_CREAM | Freq: Four times a day (QID) | CUTANEOUS | Status: DC | PRN

## 2022-10-28 MED ORDER — MELATONIN 5 MG PO TABS
5.0000 mg | ORAL_TABLET | Freq: Every day | ORAL | Status: DC
  Administered 2022-10-28 – 2022-11-01 (×5): 5 mg via ORAL
  Filled 2022-10-28 (×5): qty 1

## 2022-10-28 MED ORDER — ENSURE ENLIVE PO LIQD
237.0000 mL | Freq: Three times a day (TID) | ORAL | Status: DC
  Administered 2022-10-28 – 2022-11-02 (×12): 237 mL via ORAL
  Filled 2022-10-28 (×2): qty 237

## 2022-10-28 MED ORDER — MAGNESIUM OXIDE -MG SUPPLEMENT 400 (240 MG) MG PO TABS
400.0000 mg | ORAL_TABLET | Freq: Three times a day (TID) | ORAL | Status: DC
  Administered 2022-10-29 – 2022-11-02 (×11): 400 mg via ORAL
  Filled 2022-10-28 (×11): qty 1

## 2022-10-28 MED ORDER — LACTATED RINGERS IV SOLN: INTRAVENOUS | Status: DC

## 2022-10-28 MED ORDER — ACETAMINOPHEN 500 MG PO TABS: 1000.0000 mg | ORAL_TABLET | Freq: Three times a day (TID) | ORAL | Status: DC | PRN

## 2022-10-28 MED ORDER — UMECLIDINIUM BROMIDE 62.5 MCG/ACT IN AEPB
1.0000 | INHALATION_SPRAY | Freq: Every morning | RESPIRATORY_TRACT | Status: DC
  Administered 2022-10-30 – 2022-11-02 (×4): 1 via RESPIRATORY_TRACT
  Filled 2022-10-28: qty 7

## 2022-10-28 MED ORDER — MOMETASONE FURO-FORMOTEROL FUM 200-5 MCG/ACT IN AERO
2.0000 | INHALATION_SPRAY | Freq: Two times a day (BID) | RESPIRATORY_TRACT | Status: DC
  Administered 2022-10-30 – 2022-11-02 (×6): 2 via RESPIRATORY_TRACT
  Filled 2022-10-28: qty 8.8

## 2022-10-28 MED ORDER — IOHEXOL 350 MG/ML SOLN
75.0000 mL | Freq: Once | INTRAVENOUS | Status: AC | PRN
  Administered 2022-10-28: 75 mL via INTRAVENOUS

## 2022-10-28 MED ORDER — SENNOSIDES-DOCUSATE SODIUM 8.6-50 MG PO TABS: 1.0000 | ORAL_TABLET | Freq: Every evening | ORAL | Status: DC | PRN

## 2022-10-28 MED ORDER — METOPROLOL TARTRATE 25 MG PO TABS
25.0000 mg | ORAL_TABLET | Freq: Two times a day (BID) | ORAL | Status: DC
  Administered 2022-10-28 – 2022-10-30 (×3): 25 mg via ORAL
  Filled 2022-10-28 (×3): qty 1

## 2022-10-28 MED ORDER — FUROSEMIDE 20 MG PO TABS
40.0000 mg | ORAL_TABLET | Freq: Every morning | ORAL | Status: DC
  Filled 2022-10-28: qty 2

## 2022-10-28 MED ORDER — IPRATROPIUM-ALBUTEROL 0.5-2.5 (3) MG/3ML IN SOLN: 3.0000 mL | RESPIRATORY_TRACT | Status: DC | PRN

## 2022-10-28 MED ORDER — VITAMIN B-12 1000 MCG PO TABS
1000.0000 ug | ORAL_TABLET | Freq: Every morning | ORAL | Status: DC
  Administered 2022-10-30 – 2022-11-02 (×4): 1000 ug via ORAL
  Filled 2022-10-28 (×5): qty 1

## 2022-10-28 MED ORDER — OYSTER SHELL CALCIUM/D3 500-5 MG-MCG PO TABS
1.0000 | ORAL_TABLET | Freq: Every day | ORAL | Status: DC
  Administered 2022-10-30 – 2022-11-02 (×4): 1 via ORAL
  Filled 2022-10-28 (×5): qty 1

## 2022-10-28 MED ORDER — TAMSULOSIN HCL 0.4 MG PO CAPS
0.4000 mg | ORAL_CAPSULE | Freq: Every morning | ORAL | Status: DC
  Administered 2022-10-30 – 2022-11-02 (×4): 0.4 mg via ORAL
  Filled 2022-10-28 (×5): qty 1

## 2022-10-28 MED ORDER — LATANOPROST 0.005 % OP SOLN
1.0000 [drp] | Freq: Every day | OPHTHALMIC | Status: DC
  Administered 2022-10-30 – 2022-11-01 (×3): 1 [drp] via OPHTHALMIC
  Filled 2022-10-28 (×3): qty 2.5

## 2022-10-28 NOTE — ED Triage Notes (Signed)
Pt here from a nursing home [facility] ) with c/o rectal bleed , nurse from nursing home told ems that she say some bright red blood today form his buttock

## 2022-10-28 NOTE — ED Notes (Signed)
Patient transported to CT 

## 2022-10-28 NOTE — H&P (Signed)
History and Physical    Patient: Jacob Bass [id number] DOB: 1961-06-05 DOA: 10/28/2022 DOS: the patient was seen and examined on 10/28/2022 PCP: Tyrone Schimke., MD  Patient coming from: SNF In place  Chief Complaint: Rectal bleeding   HPI: Jacob Bass is a 62 y.o. male with medical history significant of embolic CVA, alcoholic cirrhosis with ascites, hypertension, COPD, macrocytic anemia and dementia presenting for bright red blood per rectum.  History limited as patient has dysphagia and dementia.  History provided by ED provider and documentation in patient's chart.   Nursing staff from [facility] reported to EMS that patient had bright red blood found in his diaper today.  In asking patient questions, shakes his head no to indicate that he does not have a headache, belly pain, chest pain or extremity pain.  ED course: Vital signs normal.  Lab workup CBC with hemoglobin 9.1, platelets 74, WBC 6.6.,  CMP with sodium 146, chloride 117, potassium 4.4, glucose 101, serum creatinine 2, albumin 1.7, AST 82, ALT 41.  CTA chest abdomen pelvis showed no evidence of active bleed, diffuse wall thickening from the stomach to the rectum, large volume ascites with cirrhosis, moderate left pleural effusion.  ED provider called for admission as patient had bright red blood seen on his rectal examination.    Review of Systems: unable to review all systems due to the inability of the patient to answer questions. Past Medical History:  Diagnosis Date   CHF (congestive heart failure) (HCC)    Cirrhosis (HCC)    COPD (chronic obstructive pulmonary disease) ([place])    ETOH abuse    Gastric ulcer    Hypertension    Stroke [facility])    Past Surgical History:  Procedure Laterality Date   BIOPSY  10/19/2018   Procedure: BIOPSY;  Surgeon: Yetta Flock, MD;  Location: [place];  Service: Gastroenterology;;   ESOPHAGOGASTRODUODENOSCOPY (EGD) WITH PROPOFOL N/A 10/19/2018   Procedure:  ESOPHAGOGASTRODUODENOSCOPY (EGD);  Surgeon: Yetta Flock, MD;  Location: [facility] ENDOSCOPY;  Service: Gastroenterology;  Laterality: N/A;   IR PARACENTESIS  09/19/2022   IR PARACENTESIS  09/25/2022   IR PARACENTESIS  10/03/2022   IR PARACENTESIS  10/11/2022   IR PARACENTESIS  10/24/2022   Social History:  reports that he has been smoking cigarettes. He has been smoking an average of 2 packs per day. He has never used smokeless tobacco. He reports current alcohol use. He reports that he does not use drugs.  Allergies  Allergen Reactions   Penicillins Hives   Penicillins Other (See Comments)    Unknown reaction Listed on MAR    History reviewed. No pertinent family history.  Prior to Admission medications   Medication Sig Start Date End Date Taking? Authorizing Provider  acetaminophen (TYLENOL) 500 MG tablet Take 1,000 mg by mouth every 8 (eight) hours as needed for moderate pain (severe pain).    [provider]  acetaminophen (TYLENOL) 500 MG tablet Take 1,000 mg by mouth every 8 (eight) hours as needed (moderate/severe pain).    [provider]  albuterol (VENTOLIN HFA) 108 (90 Base) MCG/ACT inhaler Inhale 2 puffs into the lungs every 4 (four) hours as needed for wheezing or shortness of breath.    [provider]  albuterol (VENTOLIN HFA) 108 (90 Base) MCG/ACT inhaler Inhale 2 puffs into the lungs every 4 (four) hours as needed for wheezing or shortness of breath.    [provider]  Calcium Carb-Cholecalciferol (CALCIUM + VITAMIN D3) 600-5 MG-MCG  TABS Take 1 tablet by mouth daily at 12 noon. (1200)    [provider]  Calcium Carbonate-Vit D-Min (RA CALCIUM 600/VIT D/MINERALS) 600-200 MG-UNIT TABS Take 1 tablet by mouth daily at 12 noon.    [provider]  cyanocobalamin (VITAMIN B12) 1000 MCG tablet Take 1,000 mcg by mouth in the morning. (0900)    [provider]  feeding supplement, ENSURE ENLIVE, (ENSURE ENLIVE) LIQD Take  237 mLs by mouth daily at 3 pm. Patient taking differently: Take 237 mLs by mouth 3 (three) times daily. Given at med pass 10/28/18   Florencia Reasons, MD  fluticasone-salmeterol (ADVAIR) 250-50 MCG/ACT AEPB Inhale 1 puff into the lungs in the morning and at bedtime.    [provider]  fluticasone-salmeterol (ADVAIR) 250-50 MCG/ACT AEPB Inhale 1 puff into the lungs in the morning and at bedtime. (0800, 2000)    [provider]  folic acid (FOLVITE) 1 MG tablet Take 1 tablet (1 mg total) by mouth daily. Patient not taking: Reported on 06/30/2022 10/28/18   Florencia Reasons, MD  folic acid (FOLVITE) 1 MG tablet Take 1 mg by mouth in the morning. (0900)    [provider]  furosemide (LASIX) 20 MG tablet Take 2 tablets (40 mg total) by mouth daily. 08/12/22   Varney Biles, MD  furosemide (LASIX) 20 MG tablet Take 40 mg by mouth in the morning. (0900)    [provider]  ipratropium-albuterol (DUONEB) 0.5-2.5 (3) MG/3ML SOLN Take 3 mLs by nebulization every 4 (four) hours as needed. Patient taking differently: Take 3 mLs by nebulization every 4 (four) hours as needed (for shortness of breathand wheezing). 10/27/18   Florencia Reasons, MD  ipratropium-albuterol (DUONEB) 0.5-2.5 (3) MG/3ML SOLN Take 3 mLs by nebulization every 4 (four) hours. 10/26/22   Little Ishikawa, MD  lactulose (CHRONULAC) 10 GM/15ML solution Take 30 mLs (20 g total) by mouth 2 (two) times daily. 07/06/22   Arlyce Dice, MD  lactulose (CHRONULAC) 10 GM/15ML solution Take 15 mLs (10 g total) by mouth 2 (two) times daily. 10/26/22   Little Ishikawa, MD  latanoprost (XALATAN) 0.005 % ophthalmic solution Place 1 drop into both eyes at bedtime. (2100)    [provider]  lisinopril (PRINIVIL,ZESTRIL) 5 MG tablet Take 1 tablet (5 mg total) by mouth daily. 10/27/18 07/01/23  Florencia Reasons, MD  loperamide (IMODIUM A-D) 2 MG tablet Take 4 mg by mouth 4 (four) times daily as needed for diarrhea or loose stools.     [provider]  magnesium oxide (MAG-OX) 400 (240 Mg) MG tablet Take 400 mg by mouth 3 (three) times daily.    [provider]  magnesium oxide (MAG-OX) 400 (240 Mg) MG tablet Take 400 mg by mouth 3 (three) times daily. (0900, 1400, 2100)    [provider]  melatonin 5 MG TABS Take 5 mg by mouth at bedtime.    [provider]  melatonin 5 MG TABS Take 5 mg by mouth at bedtime. (2100)    [provider]  metoprolol tartrate (LOPRESSOR) 25 MG tablet Take 25 mg by mouth 2 (two) times daily. (0800, 2000)    [provider]  Nutritional Supplements (NUTRITIONAL DRINK) LIQD Take 120 mLs by mouth 3 (three) times daily. House supplement (0900, 1400, 2100)    [provider]  Nystatin (GERHARDT'S BUTT CREAM) CREA Apply 1 Application topically 4 (four) times daily as needed for irritation. 10/26/22   Little Ishikawa, MD  pantoprazole (  PROTONIX) 40 MG tablet Take 1 tablet (40 mg total) by mouth 2 (two) times daily. Patient taking differently: Take 40 mg by mouth daily. 10/20/18   Charlynne Cousins, MD  pantoprazole (PROTONIX) 40 MG tablet Take 40 mg by mouth 2 (two) times daily. (0800, 2000)    [provider]  senna-docusate (SENOKOT-S) 8.6-50 MG tablet Take 1 tablet by mouth at bedtime as needed for mild constipation or moderate constipation. 10/26/22   Little Ishikawa, MD  tamsulosin (FLOMAX) 0.4 MG CAPS capsule Take 1 capsule (0.4 mg total) by mouth daily after supper. Patient taking differently: Take 0.4 mg by mouth every evening. 10/27/18   Florencia Reasons, MD  tamsulosin (FLOMAX) 0.4 MG CAPS capsule Take 0.4 mg by mouth in the morning. (0900)    [provider]  tiotropium (SPIRIVA) 18 MCG inhalation capsule Place 18 mcg into inhaler and inhale daily.    [provider]  tiotropium (SPIRIVA) 18 MCG inhalation capsule Place 18 mcg into inhaler and inhale in the morning. (0900)    [provider]   vitamin B-12 1000 MCG tablet Take 1 tablet (1,000 mcg total) by mouth daily. 10/28/18   Florencia Reasons, MD    Physical Exam: Vitals:   10/28/22 1759 10/28/22 1839 10/28/22 1840 10/28/22 1845  BP:  128/68  (!) 127/59  Pulse:  88  86  Resp: '17 17 17 18  '$ Temp: 98.3 F (36.8 C)     TempSrc: Oral     SpO2:  100%  100%   GEN:     alert, chronically ill-appearing elderly male with aphasia in no apparent distress, grunting and responding with head motions   HENT:  mucus membranes tacky, oropharyngeal without lesions or erythema,  nares patent, no nasal discharge  EYES:   pupils equal and reactive, EOM intact RESP:  no increased work of breathing, no wheezing, faint rales on the left lateral lung CVS:   regular rate and rhythm, distal pulses intact   ABD:  soft, non-tender; bowel sounds present; distended abdomen, no guarding, no rebound EXT:  Lower right leg wounds which covered with skin dressing, no edema, left upper extremity contracture, moving lower extremities on command NEURO: Alert, responds to some commands, strength testing not assessed Skin:   warm and dry, see extremity exam   Data Reviewed:  Hemoglobin: 9.1 with MCV 107.2 Platelets 74 WBC 6.6 Sodium 146 Chloride 117 Potassium 4.4 Glucose 101 Serum creatinine 1.91<2.0 Albumin 1.7 AST 82 ALT 41 CTA chest abdomen pelvis: No active GI bleed, cirrhosis with large volume ascites, clear effusion, diffuse wall thickening from stomach to rectum Admission EKG: None available for review  Assessment and Plan: Principal Problem:   Bright red blood per rectum Active Problems:   Anemia, macrocytic   Thrombocytopenia (HCC)   Dysphagia   Hypomagnesemia   COPD (chronic obstructive pulmonary disease) (HCC)   Hypernatremia   HTN (hypertension)   COPD (chronic obstructive pulmonary disease) (HCC)   Cirrhosis (HCC)   Stage 3b chronic kidney disease (CKD) (HCC)    * Bright red blood per rectum CTA abdomen pelvis with contrast on  admission did not show evidence of acute GI bleed.  There was some diffuse wall thickening throughout the colon and rectum.  Hemoglobin stable at 9.1 with baseline being around 10.  ED provider performed a DRE and reports improvement of bright red blood reports on his glove.   Placed in observation on progressive Non-urgent consult with Dr Bryan Lemma, [facility] to see  in AM Cardiac monitoring N.p.o. at midnight for possible procedure Type and screen placed by ED provider Protonix '40mg'$  po twice daily -switch to IV if needed SCDs for VTE prophylaxis Trend hemoglobin  Anemia, macrocytic Hemoglobin stable initially 9.1 with MCV 107.2. -AM CBC -Continue B12 and folate supplementation  Left pleural effusion  Left lower lobe opacity High concern for aspiration.  CTA chest abdomen pelvis and chest x-ray show left lower lung opacity felt to be a effusion.  There is similar effusion on admission in January.  There is no leukocytosis on admission.  Holding antibiotics for now.  COPD Satting well on room air.  Continue home Spiriva and switch to [facility] per formulary -Consider scheduled DuoNebs instead of as needed  Thrombocytopenia Platelets 74 Admission.  Monitor with a.m. CBCs.  Hold anticoagulation for now.  - SCDs for VTE   Dysphagia Per nursing facility paperwork.  She is on a pured diet thin liquids and this was also the last SLP recommendation upon discharge -SLP  consult placed  Hypomagnesemia -Continue home magnesium supplementation -Check a.m. Mg level   Hypernatremic-hyperchloremia Suspect this is due to dehydration -Maintenance fluids: LR at 125 mL/h  Hypertension He is normotensive on admission (amlodipine, Lasix, metoprolol).  Lisinopril was discontinued at his last hospital admission.  This was not restarted. -Continue home medications:   Alcoholic  cirrhosis with ascites  History of HCV  CTA chest abdomen pelvis showed large volume ascites.  It appears he had his  last IR paracentesis on 10/25/2019. -Consider repeat IR paracentesis - Lasix and lactulose   Acute on chronic kidney disease stage IIIb Creatinine is around 2 which baseline appears to be around 1-1.3.  On discharge his creatinine was 1.5. -Monitor renal function and electrolytes -Avoid renal toxic agents -Renally dose medications as needed    Advance Care Planning:   Code Status: Full Code   Consults: Gastroenterology  Family Communication: None  Severity of Illness: The appropriate patient status for this patient is OBSERVATION. Observation status is judged to be reasonable and necessary in order to provide the required intensity of service to ensure the patient's safety. The patient's presenting symptoms, physical exam findings, and initial radiographic and laboratory data in the context of their medical condition is felt to place them at decreased risk for further clinical deterioration. Furthermore, it is anticipated that the patient will be medically stable for discharge from the hospital within 2 midnights of admission.   Author: Lyndee Hensen, DO 10/28/2022 9:05 PM  For on call review [email or web address].

## 2022-10-28 NOTE — ED Provider Notes (Signed)
[place] Provider Note   CSN: [id number] Arrival date & time: 10/28/22  1749     History  No chief complaint on file.   Jacob Bass is a 62 y.o. male.  62 year old male presents from nursing home with bright red blood per rectum.  History of dementia and current history is from EMS.  Patient is currently at his baseline according to staff.  Nursing home staff reportedly found bright red blood in his diaper.  He has not had any fever or emesis.  Transferred here for further evaluation       Home Medications Prior to Admission medications   Medication Sig Start Date End Date Taking? Authorizing Provider  acetaminophen (TYLENOL) 500 MG tablet Take 1,000 mg by mouth every 8 (eight) hours as needed for moderate pain (severe pain).    [provider]  acetaminophen (TYLENOL) 500 MG tablet Take 1,000 mg by mouth every 8 (eight) hours as needed (moderate/severe pain).    [provider]  albuterol (VENTOLIN HFA) 108 (90 Base) MCG/ACT inhaler Inhale 2 puffs into the lungs every 4 (four) hours as needed for wheezing or shortness of breath.    [provider]  albuterol (VENTOLIN HFA) 108 (90 Base) MCG/ACT inhaler Inhale 2 puffs into the lungs every 4 (four) hours as needed for wheezing or shortness of breath.    [provider]  Calcium Carb-Cholecalciferol (CALCIUM + VITAMIN D3) 600-5 MG-MCG TABS Take 1 tablet by mouth daily at 12 noon. (1200)    [provider]  Calcium Carbonate-Vit D-Min (RA CALCIUM 600/VIT D/MINERALS) 600-200 MG-UNIT TABS Take 1 tablet by mouth daily at 12 noon.    [provider]  cyanocobalamin (VITAMIN B12) 1000 MCG tablet Take 1,000 mcg by mouth in the morning. (0900)    [provider]  feeding supplement, ENSURE ENLIVE, (ENSURE ENLIVE) LIQD Take 237 mLs by mouth daily at 3 pm. Patient taking differently: Take 237 mLs by mouth 3 (three) times daily. Given at  med pass 10/28/18   Florencia Reasons, MD  fluticasone-salmeterol (ADVAIR) 250-50 MCG/ACT AEPB Inhale 1 puff into the lungs in the morning and at bedtime.    [provider]  fluticasone-salmeterol (ADVAIR) 250-50 MCG/ACT AEPB Inhale 1 puff into the lungs in the morning and at bedtime. (0800, 2000)    [provider]  folic acid (FOLVITE) 1 MG tablet Take 1 tablet (1 mg total) by mouth daily. Patient not taking: Reported on 06/30/2022 10/28/18   Florencia Reasons, MD  folic acid (FOLVITE) 1 MG tablet Take 1 mg by mouth in the morning. (0900)    [provider]  furosemide (LASIX) 20 MG tablet Take 2 tablets (40 mg total) by mouth daily. 08/12/22   Varney Biles, MD  furosemide (LASIX) 20 MG tablet Take 40 mg by mouth in the morning. (0900)    [provider]  ipratropium-albuterol (DUONEB) 0.5-2.5 (3) MG/3ML SOLN Take 3 mLs by nebulization every 4 (four) hours as needed. Patient taking differently: Take 3 mLs by nebulization every 4 (four) hours as needed (for shortness of breathand wheezing). 10/27/18   Florencia Reasons, MD  ipratropium-albuterol (DUONEB) 0.5-2.5 (3) MG/3ML SOLN Take 3 mLs by nebulization every 4 (four) hours. 10/26/22   Little Ishikawa, MD  lactulose (CHRONULAC) 10 GM/15ML solution Take 30 mLs (20 g total) by mouth 2 (two) times daily. 07/06/22   Arlyce Dice, MD  lactulose (CHRONULAC) 10 GM/15ML solution Take 15 mLs (10 g  total) by mouth 2 (two) times daily. 10/26/22   Little Ishikawa, MD  latanoprost (XALATAN) 0.005 % ophthalmic solution Place 1 drop into both eyes at bedtime. (2100)    [provider]  lisinopril (PRINIVIL,ZESTRIL) 5 MG tablet Take 1 tablet (5 mg total) by mouth daily. 10/27/18 07/01/23  Florencia Reasons, MD  loperamide (IMODIUM A-D) 2 MG tablet Take 4 mg by mouth 4 (four) times daily as needed for diarrhea or loose stools.    [provider]  magnesium oxide (MAG-OX) 400 (240 Mg) MG tablet Take 400 mg by mouth 3 (three) times daily.     [provider]  magnesium oxide (MAG-OX) 400 (240 Mg) MG tablet Take 400 mg by mouth 3 (three) times daily. (0900, 1400, 2100)    [provider]  melatonin 5 MG TABS Take 5 mg by mouth at bedtime.    [provider]  melatonin 5 MG TABS Take 5 mg by mouth at bedtime. (2100)    [provider]  metoprolol tartrate (LOPRESSOR) 25 MG tablet Take 25 mg by mouth 2 (two) times daily. (0800, 2000)    [provider]  Nutritional Supplements (NUTRITIONAL DRINK) LIQD Take 120 mLs by mouth 3 (three) times daily. House supplement (0900, 1400, 2100)    [provider]  Nystatin (GERHARDT'S BUTT CREAM) CREA Apply 1 Application topically 4 (four) times daily as needed for irritation. 10/26/22   Little Ishikawa, MD  pantoprazole (PROTONIX) 40 MG tablet Take 1 tablet (40 mg total) by mouth 2 (two) times daily. Patient taking differently: Take 40 mg by mouth daily. 10/20/18   Charlynne Cousins, MD  pantoprazole (PROTONIX) 40 MG tablet Take 40 mg by mouth 2 (two) times daily. (0800, 2000)    [provider]  senna-docusate (SENOKOT-S) 8.6-50 MG tablet Take 1 tablet by mouth at bedtime as needed for mild constipation or moderate constipation. 10/26/22   Little Ishikawa, MD  tamsulosin (FLOMAX) 0.4 MG CAPS capsule Take 1 capsule (0.4 mg total) by mouth daily after supper. Patient taking differently: Take 0.4 mg by mouth every evening. 10/27/18   Florencia Reasons, MD  tamsulosin (FLOMAX) 0.4 MG CAPS capsule Take 0.4 mg by mouth in the morning. (0900)    [provider]  tiotropium (SPIRIVA) 18 MCG inhalation capsule Place 18 mcg into inhaler and inhale daily.    [provider]  tiotropium (SPIRIVA) 18 MCG inhalation capsule Place 18 mcg into inhaler and inhale in the morning. (0900)    [provider]  vitamin B-12 1000 MCG tablet Take 1 tablet (1,000 mcg total) by mouth daily. 10/28/18   Florencia Reasons, MD      Allergies     Penicillins and Penicillins    Review of Systems   Review of Systems  Unable to perform ROS: Dementia    Physical Exam Updated Vital Signs BP 132/79   Pulse 83   Temp 98.3 F (36.8 C) (Oral)   Resp 17  Physical Exam Vitals and nursing note reviewed. Exam conducted with a chaperone present.  Constitutional:      General: He is not in acute distress.    Appearance: He is underweight. He is not toxic-appearing.  HENT:     Head: Normocephalic and atraumatic.  Eyes:     General: Lids are normal.     Conjunctiva/sclera: Conjunctivae normal.     Pupils: Pupils are equal, round, and reactive to light.  Neck:     Thyroid: No  thyroid mass.     Trachea: No tracheal deviation.  Cardiovascular:     Rate and Rhythm: Normal rate and regular rhythm.     Heart sounds: Normal heart sounds. No murmur heard.    No gallop.  Pulmonary:     Effort: Pulmonary effort is normal. No respiratory distress.     Breath sounds: Normal breath sounds. No stridor. No decreased breath sounds, wheezing, rhonchi or rales.  Abdominal:     General: There is no distension.     Palpations: Abdomen is soft.     Tenderness: There is no abdominal tenderness. There is no rebound.  Genitourinary:    Comments: Gross blood noted on digital rectal exam no external hemorrhoids noted Musculoskeletal:        General: No tenderness. Normal range of motion.     Cervical back: Normal range of motion and neck supple.  Skin:    General: Skin is warm and dry.     Findings: No abrasion or rash.  Neurological:     Mental Status: He is alert. Mental status is at baseline.     Cranial Nerves: No cranial nerve deficit.     Sensory: No sensory deficit.     Motor: Atrophy present. No tremor.     Comments: Withdraws to pain in all 4 extremities  Psychiatric:        Attention and Perception: Attention normal.        Speech: Speech normal.        Behavior: Behavior normal.     ED Results / Procedures / Treatments    Labs (all labs ordered are listed, but only abnormal results are displayed) Labs Reviewed  CBC WITH DIFFERENTIAL/PLATELET  COMPREHENSIVE METABOLIC PANEL  TYPE AND SCREEN    EKG None  Radiology No results found.  Procedures Procedures    Medications Ordered in ED Medications  lactated ringers infusion (has no administration in time range)    ED Course/ Medical Decision Making/ A&P                             Medical Decision Making Amount and/or Complexity of Data Reviewed Labs: ordered. Radiology: ordered.  Risk Prescription drug management.   Patient presented with bright red blood per rectum.  Had just been discharged from the hospital 2 days ago.  That visit was reviewed.  Patient had rectal bleeding at that time but was felt to be due to a hemorrhoid.  His hemoglobin here is stable however his creatinine is worse compared to baseline.  Had a CT of the abdomen to evaluate for GI bleeding which per my interpretation did not show any evidence of active bleed but did show some diffuse thickening throughout the colon or rectum.  Patient has a known history of cirrhosis.  Abdominal exam is benign at this time.  No concern for SBP.  He does have some abdominal distention.  Was given IV hydration here.  Will require admission.  Will consult hospitalist team        Final Clinical Impression(s) / ED Diagnoses Final diagnoses:  None    Rx / DC Orders ED Discharge Orders     None         Lacretia Leigh, MD 10/28/22 1945

## 2022-10-28 NOTE — Assessment & Plan Note (Addendum)
Hemoglobin stable initially 9.1 with MCV 107.2. -AM CBC -Continue B12 and folate supplementation

## 2022-10-28 NOTE — Assessment & Plan Note (Addendum)
CTA abdomen pelvis with contrast on admission did not show evidence of acute bleed.   Placed in observation on med telemetry Non-urgent consult with Dr Bryan Lemma,  GI to see in AM Cardiac monitoring N.p.o. at midnight for possible procedure Type and screen placed by ED provider Protonix '40mg'$  po twice daily -switch to IV if needed SCDs for VTE prophylaxis Trend hemoglobin

## 2022-10-29 ENCOUNTER — Other Ambulatory Visit: Payer: Self-pay

## 2022-10-29 DIAGNOSIS — D62 Acute posthemorrhagic anemia: Secondary | ICD-10-CM | POA: Diagnosis present

## 2022-10-29 DIAGNOSIS — Z7951 Long term (current) use of inhaled steroids: Secondary | ICD-10-CM | POA: Diagnosis not present

## 2022-10-29 DIAGNOSIS — K7031 Alcoholic cirrhosis of liver with ascites: Secondary | ICD-10-CM | POA: Diagnosis present

## 2022-10-29 DIAGNOSIS — L89222 Pressure ulcer of left hip, stage 2: Secondary | ICD-10-CM | POA: Diagnosis present

## 2022-10-29 DIAGNOSIS — N179 Acute kidney failure, unspecified: Secondary | ICD-10-CM | POA: Diagnosis present

## 2022-10-29 DIAGNOSIS — Z6820 Body mass index (BMI) 20.0-20.9, adult: Secondary | ICD-10-CM | POA: Diagnosis not present

## 2022-10-29 DIAGNOSIS — K922 Gastrointestinal hemorrhage, unspecified: Secondary | ICD-10-CM | POA: Diagnosis not present

## 2022-10-29 DIAGNOSIS — D6959 Other secondary thrombocytopenia: Secondary | ICD-10-CM | POA: Diagnosis present

## 2022-10-29 DIAGNOSIS — J449 Chronic obstructive pulmonary disease, unspecified: Secondary | ICD-10-CM | POA: Diagnosis present

## 2022-10-29 DIAGNOSIS — D539 Nutritional anemia, unspecified: Secondary | ICD-10-CM | POA: Diagnosis present

## 2022-10-29 DIAGNOSIS — I69354 Hemiplegia and hemiparesis following cerebral infarction affecting left non-dominant side: Secondary | ICD-10-CM | POA: Diagnosis not present

## 2022-10-29 DIAGNOSIS — N1832 Chronic kidney disease, stage 3b: Secondary | ICD-10-CM | POA: Diagnosis present

## 2022-10-29 DIAGNOSIS — I130 Hypertensive heart and chronic kidney disease with heart failure and stage 1 through stage 4 chronic kidney disease, or unspecified chronic kidney disease: Secondary | ICD-10-CM | POA: Diagnosis present

## 2022-10-29 DIAGNOSIS — Z79899 Other long term (current) drug therapy: Secondary | ICD-10-CM | POA: Diagnosis not present

## 2022-10-29 DIAGNOSIS — R636 Underweight: Secondary | ICD-10-CM | POA: Diagnosis present

## 2022-10-29 DIAGNOSIS — E878 Other disorders of electrolyte and fluid balance, not elsewhere classified: Secondary | ICD-10-CM | POA: Diagnosis present

## 2022-10-29 DIAGNOSIS — K625 Hemorrhage of anus and rectum: Secondary | ICD-10-CM | POA: Diagnosis present

## 2022-10-29 DIAGNOSIS — R4701 Aphasia: Secondary | ICD-10-CM | POA: Diagnosis present

## 2022-10-29 DIAGNOSIS — F17210 Nicotine dependence, cigarettes, uncomplicated: Secondary | ICD-10-CM | POA: Diagnosis present

## 2022-10-29 DIAGNOSIS — F0390 Unspecified dementia without behavioral disturbance: Secondary | ICD-10-CM | POA: Diagnosis present

## 2022-10-29 DIAGNOSIS — K5791 Diverticulosis of intestine, part unspecified, without perforation or abscess with bleeding: Secondary | ICD-10-CM | POA: Diagnosis present

## 2022-10-29 DIAGNOSIS — E870 Hyperosmolality and hypernatremia: Secondary | ICD-10-CM | POA: Diagnosis present

## 2022-10-29 DIAGNOSIS — R1319 Other dysphagia: Secondary | ICD-10-CM | POA: Diagnosis present

## 2022-10-29 DIAGNOSIS — E876 Hypokalemia: Secondary | ICD-10-CM | POA: Diagnosis present

## 2022-10-29 DIAGNOSIS — R1312 Dysphagia, oropharyngeal phase: Secondary | ICD-10-CM | POA: Diagnosis not present

## 2022-10-29 DIAGNOSIS — L89626 Pressure-induced deep tissue damage of left heel: Secondary | ICD-10-CM | POA: Diagnosis present

## 2022-10-29 LAB — COMPREHENSIVE METABOLIC PANEL
ALT: 40 U/L (ref 0–44)
AST: 69 U/L — ABNORMAL HIGH (ref 15–41)
Albumin: 1.6 g/dL — ABNORMAL LOW (ref 3.5–5.0)
Alkaline Phosphatase: 103 U/L (ref 38–126)
Anion gap: 8 (ref 5–15)
BUN: 31 mg/dL — ABNORMAL HIGH (ref 8–23)
CO2: 23 mmol/L (ref 22–32)
Calcium: 7.6 mg/dL — ABNORMAL LOW (ref 8.9–10.3)
Chloride: 116 mmol/L — ABNORMAL HIGH (ref 98–111)
Creatinine, Ser: 1.8 mg/dL — ABNORMAL HIGH (ref 0.61–1.24)
GFR, Estimated: 42 mL/min — ABNORMAL LOW (ref >60)
Glucose, Bld: 111 mg/dL — ABNORMAL HIGH (ref 70–99)
Potassium: 3.8 mmol/L (ref 3.5–5.1)
Sodium: 147 mmol/L — ABNORMAL HIGH (ref 135–145)
Total Bilirubin: 0.8 mg/dL (ref 0.3–1.2)
Total Protein: 5.9 g/dL — ABNORMAL LOW (ref 6.5–8.1)

## 2022-10-29 LAB — CBC
HCT: 25.2 % — ABNORMAL LOW (ref 39.0–52.0)
Hemoglobin: 8.3 g/dL — ABNORMAL LOW (ref 13.0–17.0)
MCH: 35.2 pg — ABNORMAL HIGH (ref 26.0–34.0)
MCHC: 32.9 g/dL (ref 30.0–36.0)
MCV: 106.8 fL — ABNORMAL HIGH (ref 80.0–100.0)
Platelets: 67 10*3/uL — ABNORMAL LOW (ref 150–400)
RBC: 2.36 MIL/uL — ABNORMAL LOW (ref 4.22–5.81)
RDW: 16.9 % — ABNORMAL HIGH (ref 11.5–15.5)
WBC: 6.1 10*3/uL (ref 4.0–10.5)
nRBC: 0 % (ref 0.0–0.2)

## 2022-10-29 LAB — PROTEIN, TOTAL: Total Protein: 6.2 g/dL — ABNORMAL LOW (ref 6.5–8.1)

## 2022-10-29 LAB — MAGNESIUM: Magnesium: 1.9 mg/dL (ref 1.7–2.4)

## 2022-10-29 LAB — AMMONIA: Ammonia: 73 umol/L — ABNORMAL HIGH (ref 9–35)

## 2022-10-29 MED ORDER — FUROSEMIDE 10 MG/ML IJ SOLN
40.0000 mg | Freq: Two times a day (BID) | INTRAMUSCULAR | Status: DC
  Administered 2022-10-29 – 2022-10-30 (×2): 40 mg via INTRAVENOUS
  Filled 2022-10-29 (×3): qty 4

## 2022-10-29 MED ORDER — CIPROFLOXACIN IN D5W 400 MG/200ML IV SOLN
400.0000 mg | Freq: Two times a day (BID) | INTRAVENOUS | Status: DC
  Administered 2022-10-29 – 2022-11-02 (×7): 400 mg via INTRAVENOUS
  Filled 2022-10-29 (×12): qty 200

## 2022-10-29 NOTE — ED Notes (Signed)
Soiled linens and brief removed, replaced with clean linens and brief. Patient readjusted in the bed

## 2022-10-29 NOTE — Consult Note (Signed)
Consultation  Referring Provider: No ref. provider found Primary Care Physician:  Tyrone Schimke., MD Primary Gastroenterologist: Deboraha Sprang Dr. Silverio Decamp last admission  Reason for Consultation: Red blood per rectum  HPI: Jacob Bass is a 62 y.o. male who was brought to the emergency room from nursing home last evening after just being discharged from the hospital on 10/26/2022 after a 6-week hospital stay here.  He had an acute CVA with left-sided deficits, neurogenic dysphagia which required tube feedings for a period of time and was ultimately switched to a dysphagia 1 diet on discharge.  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 During that admission he required multiple paracenteses, was unable to have a PEG placed due to ascites.  There were also guardianship issues and there were to be further guardianship meetings occurring last week.  Palliative care had been discussed but they did not see as the guardianship issue was still up in the air. During his last hospitalization he had transient hematochezia and was seen by GI on 09/26/2022.  He did not have any drop in hemoglobin, patient at that time was not consentable did not have a POA and was managed conservatively for probable hemorrhoidal bleeding.  He had a small amount of rectal bleeding on 215 and on 10/21/2022 but no drop in hemoglobin. On discharge from the hospital hemoglobin is 8.5/hematocrit 26.7. Apparently yesterday was noted to have bright red blood in his diaper and was brought back to the hospital.  He had 1 episode of stool in the emergency room which nurse reports as primarily some red blood with mucus. Patient is alert denies any abdominal pain or rectal pain. He had CT angiography yesterday due to concerns for bleeding was noted to have a moderate left-sided effusion, and evidence of diffuse  wall thickening of the colon and rectum and stomach in association with a large volume of ascites and diffuse mesenteric stranding body wall anasarca and intestinal changes were felt to be representative of portal colopathy/gastropathy and also noted to have prominent gastric varices.  Labs on admission hemoglobin 9.1/28.2 stable from discharge Sodium 146 BUN 32/creatinine 1.91 AST 82 INR was pending Today hemoglobin 8.5-venous ammonia 73  EGD had been done in February 2020 for anemia revealed a gastric ulcer clean-based meters with surrounding area showing some polypoid mucosa the biopsy showed reactive gastropathy no H. pylori. Colonoscopy had been planned apparently in November 2023 but not done as patient would not drink a bowel prep.   Past Medical History:  Diagnosis Date   CHF (congestive heart failure) (HCC)    Cirrhosis (HCC)    COPD (chronic obstructive pulmonary disease) ([place])    ETOH abuse    Gastric ulcer    Hypertension    Stroke [facility])     Past Surgical History:  Procedure Laterality Date   BIOPSY  10/19/2018   Procedure: BIOPSY;  Surgeon: Yetta Flock, MD;  Location: [place];  Service: Gastroenterology;;   ESOPHAGOGASTRODUODENOSCOPY (EGD) WITH PROPOFOL N/A 10/19/2018   Procedure: ESOPHAGOGASTRODUODENOSCOPY (EGD);  Surgeon: Yetta Flock, MD;  Location[facility] ENDOSCOPY;  Service: Gastroenterology;  Laterality: N/A;   IR PARACENTESIS  09/19/2022   IR PARACENTESIS  09/25/2022   IR PARACENTESIS  10/03/2022   IR PARACENTESIS  10/11/2022   IR PARACENTESIS  10/24/2022    Prior to Admission medications   Medication Sig Start Date End Date Taking? Authorizing  Provider  acetaminophen (TYLENOL) 500 MG tablet Take 1,000 mg by mouth every 8 (eight) hours as needed for moderate pain (severe pain).   Yes [provider]  albuterol (VENTOLIN HFA) 108 (90 Base) MCG/ACT inhaler Inhale 2 puffs into the lungs every 4 (four) hours as needed for wheezing or shortness  of breath.   Yes [provider]  Calcium Carb-Cholecalciferol (CALCIUM + VITAMIN D3) 600-5 MG-MCG TABS Take 1 tablet by mouth daily at 12 noon. (1200)   Yes [provider]  cyanocobalamin (VITAMIN B12) 1000 MCG tablet Take 1,000 mcg by mouth in the morning. (0900)   Yes [provider]  fluticasone-salmeterol (ADVAIR) 250-50 MCG/ACT AEPB Inhale 1 puff into the lungs in the morning and at bedtime.   Yes [provider]  folic acid (FOLVITE) 1 MG tablet Take 1 tablet (1 mg total) by mouth daily. 10/28/18  Yes Florencia Reasons, MD  furosemide (LASIX) 20 MG tablet Take 2 tablets (40 mg total) by mouth daily. 08/12/22  Yes Nanavati, Ankit, MD  ipratropium-albuterol (DUONEB) 0.5-2.5 (3) MG/3ML SOLN Take 3 mLs by nebulization every 4 (four) hours. 10/26/22  Yes Little Ishikawa, MD  lactulose (CHRONULAC) 10 GM/15ML solution Take 15 mLs (10 g total) by mouth 2 (two) times daily. 10/26/22  Yes Little Ishikawa, MD  latanoprost (XALATAN) 0.005 % ophthalmic solution Place 1 drop into both eyes at bedtime. (2100)   Yes [provider]  loperamide (IMODIUM A-D) 2 MG tablet Take 4 mg by mouth every 6 (six) hours as needed for diarrhea or loose stools.   Yes [provider]  magnesium oxide (MAG-OX) 400 (240 Mg) MG tablet Take 400 mg by mouth 3 (three) times daily.   Yes [provider]  melatonin 5 MG TABS Take 5 mg by mouth at bedtime.   Yes [provider]  metoprolol tartrate (LOPRESSOR) 25 MG tablet Take 25 mg by mouth 2 (two) times daily. (0800, 2000)   Yes [provider]  omeprazole (PRILOSEC OTC) 20 MG tablet Take 40 mg by mouth 2 (two) times daily.   Yes [provider]  senna-docusate (SENOKOT-S) 8.6-50 MG tablet Take 1 tablet by mouth at bedtime as needed for mild constipation or moderate constipation. 10/26/22  Yes Little Ishikawa, MD  tamsulosin (FLOMAX) 0.4 MG CAPS capsule Take 0.4 mg by mouth in the morning.  (0900)   Yes [provider]  tiotropium (SPIRIVA) 18 MCG inhalation capsule Place 18 mcg into inhaler and inhale daily.   Yes [provider]  feeding supplement, ENSURE ENLIVE, (ENSURE ENLIVE) LIQD Take 237 mLs by mouth daily at 3 pm. Patient taking differently: Take 237 mLs by mouth 3 (three) times daily. Given at med pass 10/28/18   Florencia Reasons, MD  ipratropium-albuterol (DUONEB) 0.5-2.5 (3) MG/3ML SOLN Take 3 mLs by nebulization every 4 (four) hours as needed. Patient not taking: Reported on 10/29/2022 10/27/18   Florencia Reasons, MD  lactulose (CHRONULAC) 10 GM/15ML solution Take 30 mLs (20 g total) by mouth 2 (two) times daily. Patient not taking: Reported on 10/29/2022 07/06/22   Arlyce Dice, MD  lisinopril (PRINIVIL,ZESTRIL) 5 MG tablet Take 1 tablet (5 mg total) by mouth daily. 10/27/18 07/01/23  Florencia Reasons, MD  Nutritional Supplements (NUTRITIONAL DRINK) LIQD Take 120 mLs by mouth 3 (three) times daily. House supplement (0900, 1400, 2100)    [provider]  Nystatin (GERHARDT'S BUTT CREAM) CREA Apply 1 Application topically 4 (four) times daily as needed for  irritation. 10/26/22   Little Ishikawa, MD  pantoprazole (PROTONIX) 40 MG tablet Take 1 tablet (40 mg total) by mouth 2 (two) times daily. Patient not taking: Reported on 10/29/2022 10/20/18   Charlynne Cousins, MD  tamsulosin (FLOMAX) 0.4 MG CAPS capsule Take 1 capsule (0.4 mg total) by mouth daily after supper. Patient not taking: Reported on 10/29/2022 10/27/18   Florencia Reasons, MD  vitamin B-12 1000 MCG tablet Take 1 tablet (1,000 mcg total) by mouth daily. Patient not taking: Reported on 10/29/2022 10/28/18   Florencia Reasons, MD    Current Facility-Administered Medications  Medication Dose Route Frequency Provider Last Rate Last Admin   acetaminophen (TYLENOL) tablet 1,000 mg  1,000 mg Oral Q8H PRN Brimage, Vondra, DO       calcium-vitamin D (OSCAL WITH D) 500-5 MG-MCG per tablet 1 tablet  1 tablet Oral Q1200 Brimage, Vondra,  DO       cyanocobalamin (VITAMIN B12) tablet 1,000 mcg  1,000 mcg Oral q AM Brimage, Vondra, DO       feeding supplement (ENSURE ENLIVE / ENSURE PLUS) liquid 237 mL  237 mL Oral TID Lyndee Hensen, DO   237 mL at [id number]   folic acid (FOLVITE) tablet 1 mg  1 mg Oral Daily Brimage, Vondra, DO       furosemide (LASIX) injection 40 mg  40 mg Intravenous BID Richarda Osmond, MD       Gerhardt's butt cream 1 Application  1 Application Topical QID PRN Brimage, Vondra, DO       ipratropium-albuterol (DUONEB) 0.5-2.5 (3) MG/3ML nebulizer solution 3 mL  3 mL Nebulization Q4H PRN Brimage, Vondra, DO       lactated ringers infusion   Intravenous Continuous Brimage, Vondra, DO   Stopped at 10/29/22 0336   lactulose (CHRONULAC) 10 GM/15ML solution 10 g  10 g Oral BID Brimage, Vondra, DO   10 g at 10/28/22 2204   latanoprost (XALATAN) 0.005 % ophthalmic solution 1 drop  1 drop Both Eyes QHS Brimage, Vondra, DO       magnesium oxide (MAG-OX) tablet 400 mg  400 mg Oral TID Brimage, Vondra, DO       melatonin tablet 5 mg  5 mg Oral QHS Brimage, Vondra, DO   5 mg at 10/28/22 2202   metoprolol tartrate (LOPRESSOR) tablet 25 mg  25 mg Oral BID Brimage, Vondra, DO   25 mg at 10/28/22 2201   mometasone-formoterol (DULERA) 200-5 MCG/ACT inhaler 2 puff  2 puff Inhalation BID Brimage, Vondra, DO       pantoprazole (PROTONIX) EC tablet 40 mg  40 mg Oral BID Brimage, Vondra, DO   40 mg at 10/28/22 2202   senna-docusate (Senokot-S) tablet 1 tablet  1 tablet Oral QHS PRN Brimage, Vondra, DO       tamsulosin (FLOMAX) capsule 0.4 mg  0.4 mg Oral q AM Brimage, Vondra, DO       umeclidinium bromide (INCRUSE ELLIPTA) 62.5 MCG/ACT 1 puff  1 puff Inhalation q AM Brimage, Vondra, DO       Current Outpatient Medications  Medication Sig Dispense Refill   acetaminophen (TYLENOL) 500 MG tablet Take 1,000 mg by mouth every 8 (eight) hours as needed for moderate pain (severe pain).     albuterol (VENTOLIN HFA) 108 (90 Base)  MCG/ACT inhaler Inhale 2 puffs into the lungs every 4 (four) hours as needed for wheezing or shortness of breath.     Calcium Carb-Cholecalciferol (CALCIUM + VITAMIN D3)  600-5 MG-MCG TABS Take 1 tablet by mouth daily at 12 noon. (1200)     cyanocobalamin (VITAMIN B12) 1000 MCG tablet Take 1,000 mcg by mouth in the morning. (0900)     fluticasone-salmeterol (ADVAIR) 250-50 MCG/ACT AEPB Inhale 1 puff into the lungs in the morning and at bedtime.     folic acid (FOLVITE) 1 MG tablet Take 1 tablet (1 mg total) by mouth daily.     furosemide (LASIX) 20 MG tablet Take 2 tablets (40 mg total) by mouth daily. 3 tablet 0   ipratropium-albuterol (DUONEB) 0.5-2.5 (3) MG/3ML SOLN Take 3 mLs by nebulization every 4 (four) hours. 360 mL 1   lactulose (CHRONULAC) 10 GM/15ML solution Take 15 mLs (10 g total) by mouth 2 (two) times daily. 236 mL 0   latanoprost (XALATAN) 0.005 % ophthalmic solution Place 1 drop into both eyes at bedtime. (2100)     loperamide (IMODIUM A-D) 2 MG tablet Take 4 mg by mouth every 6 (six) hours as needed for diarrhea or loose stools.     magnesium oxide (MAG-OX) 400 (240 Mg) MG tablet Take 400 mg by mouth 3 (three) times daily.     melatonin 5 MG TABS Take 5 mg by mouth at bedtime.     metoprolol tartrate (LOPRESSOR) 25 MG tablet Take 25 mg by mouth 2 (two) times daily. (0800, 2000)     omeprazole (PRILOSEC OTC) 20 MG tablet Take 40 mg by mouth 2 (two) times daily.     senna-docusate (SENOKOT-S) 8.6-50 MG tablet Take 1 tablet by mouth at bedtime as needed for mild constipation or moderate constipation. 30 tablet 1   tamsulosin (FLOMAX) 0.4 MG CAPS capsule Take 0.4 mg by mouth in the morning. (0900)     tiotropium (SPIRIVA) 18 MCG inhalation capsule Place 18 mcg into inhaler and inhale daily.     feeding supplement, ENSURE ENLIVE, (ENSURE ENLIVE) LIQD Take 237 mLs by mouth daily at 3 pm. (Patient taking differently: Take 237 mLs by mouth 3 (three) times daily. Given at med pass) 237 mL 12    ipratropium-albuterol (DUONEB) 0.5-2.5 (3) MG/3ML SOLN Take 3 mLs by nebulization every 4 (four) hours as needed. (Patient not taking: Reported on 10/29/2022) 360 mL    lactulose (CHRONULAC) 10 GM/15ML solution Take 30 mLs (20 g total) by mouth 2 (two) times daily. (Patient not taking: Reported on 10/29/2022) 236 mL 0   lisinopril (PRINIVIL,ZESTRIL) 5 MG tablet Take 1 tablet (5 mg total) by mouth daily. 30 tablet 11   Nutritional Supplements (NUTRITIONAL DRINK) LIQD Take 120 mLs by mouth 3 (three) times daily. House supplement (0900, 1400, 2100)     Nystatin (GERHARDT'S BUTT CREAM) CREA Apply 1 Application topically 4 (four) times daily as needed for irritation. 1 each 1   pantoprazole (PROTONIX) 40 MG tablet Take 1 tablet (40 mg total) by mouth 2 (two) times daily. (Patient not taking: Reported on 10/29/2022)     tamsulosin (FLOMAX) 0.4 MG CAPS capsule Take 1 capsule (0.4 mg total) by mouth daily after supper. (Patient not taking: Reported on 10/29/2022) 30 capsule    vitamin B-12 1000 MCG tablet Take 1 tablet (1,000 mcg total) by mouth daily. (Patient not taking: Reported on 10/29/2022)      Allergies as of 10/28/2022 - Review Complete 10/28/2022  Allergen Reaction Noted   Penicillins Hives 10/09/2018   Penicillins Other (See Comments) 09/10/2022    History reviewed. No pertinent family history.  Social History   Socioeconomic History  Marital status: Single    Spouse name: Not on file   Number of children: Not on file   Years of education: Not on file   Highest education level: Not on file  Occupational History   Not on file  Tobacco Use   Smoking status: Every Day    Packs/day: 2.00    Types: Cigarettes   Smokeless tobacco: Never  Substance and Sexual Activity   Alcohol use: Yes    Comment: a case a day   Drug use: No   Sexual activity: Not on file  Other Topics Concern   Not on file  Social History Narrative   ** Merged History Encounter **       Social Determinants of  Health   Financial Resource Strain: Not on file  Food Insecurity: Not on file  Transportation Needs: Not on file  Physical Activity: Not on file  Stress: Not on file  Social Connections: Not on file  Intimate Partner Violence: Not on file    Review of Systems: Pertinent positive and negative review of systems were noted in the above HPI section.  All other review of systems was otherwise negative.   Physical Exam: Vital signs in last 24 hours: Temp:  [97.4 F (36.3 C)-98.3 F (36.8 C)] 98.1 F (36.7 C) (02/26 0618) Pulse Rate:  [67-88] 67 (02/26 0618) Resp:  [14-23] 15 (02/26 0715) BP: (95-132)/(52-79) 121/52 (02/26 0715) SpO2:  [99 %-100 %] 100 % (02/26 0618) Weight:  [58.5 kg] 58.5 kg (02/26 0618)   General:   Alert,  Well-developed, frail, chronically ill-appearing older African-American male ,pleasant and cooperative in NAD.  Able to answer a few simple questions Head:  Normocephalic and atraumatic. Eyes:  Sclera clear, no icterus.   Conjunctiva pink. Ears:  Normal auditory acuity. Nose:  No deformity, discharge,  or lesions. Mouth:  No deformity or lesions.   Neck:  Supple; no masses or thyromegaly. Lungs:  Clear throughout to auscultation.   No wheezes, crackles, or rhonchi. Heart:  Regular rate and rhythm; no murmurs, clicks, rubs,  or gallops. Abdomen: Protuberant, fairly tense ascites, nontender BS active,nonpalp mass or hsm.  Bowel sounds present Rectal: Not done, done per ER MD no external lesions or hemorrhoids noted, bright red blood on examining glove Msk:  Symmetrical without gross deformities. . Pulses:  Normal pulses noted. Extremities:  Without clubbing or edema. Neurologic:  Alert and  oriented x2 left-sided hemiparesis, mild dysarthria Skin:  Intact without significant lesions or rashes.. Psych:  Alert and cooperative. Normal mood and affect.  Intake/Output from previous day: 02/25 0701 - 02/26 0700 In: 1000 [I.V.:1000] Out: -  Intake/Output this  shift: No intake/output data recorded.  Lab Results: Recent Labs    10/28/22 1808 10/28/22 1835 10/29/22 0347  WBC 6.6  --  6.1  HGB 9.1* 8.5* 8.3*  HCT 28.2* 25.0* 25.2*  PLT 74*  --  67*   BMET Recent Labs    10/28/22 1808 10/28/22 1835 10/29/22 0347  NA 146* 151* 147*  K 4.4 4.1 3.8  CL 117* 116* 116*  CO2 22  --  23  GLUCOSE 101* 92 111*  BUN 32* 36* 31*  CREATININE 1.91* 2.00* 1.80*  CALCIUM 7.7*  --  7.6*   LFT Recent Labs    10/29/22 0347  PROT 5.9*  ALBUMIN 1.6*  AST 69*  ALT 40  ALKPHOS 103  BILITOT 0.8   PT/INR No results for input(s): "LABPROT", "INR" in the last 72 hours. Hepatitis  Panel No results for input(s): "HEPBSAG", "HCVAB", "HEPAIGM", "HEPBIGM" in the last 72 hours.    IMPRESSION:  #22 62 year old African-American male with multiple significant comorbidities, currently nursing home resident who had just been discharged from the hospital 3 days ago after a 6-week hospitalization with acute CVA with left-sided deficits, During that hospitalization he also required multiple paracenteses for decompensated cirrhosis secondary to EtOH and hep C.  Currently brought back to the hospital due to bright red blood per rectum  Patient had had a couple of episodes of low-volume hematochezia during his last hospitalization without any drop in hemoglobin and endoscopic intervention was not entertained as he was stable, unable to consent and there were guardianship issues.  Bleeding was felt most consistent with hemorrhoidal bleeding.  Note that patient had declined to drink a bowel prep in November 2023.  He is not currently manifesting any large amount of GI bleeding and hemoglobin is relatively stable. Etiology of bleeding consider internal hemorrhoids, portal colopathy, proctitis CT angiography shows diffuse wall thickening of most of the colon and rectum and the stomach but this may actually be secondary to underlying liver disease and anasarca  #2  anemia normocytic acute on chronic #3 decompensated cirrhosis secondary to EtOH/hep C complicated by previous SBP, encephalopathy, and ascites  #4 dementia #5.  COPD #6.  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 SBP prophylaxis Palliative care consultation appropriate during this admission.   Roe Koffman PA-C 10/29/2022, 10:08 AM

## 2022-10-29 NOTE — ED Notes (Signed)
ED TO INPATIENT HANDOFF REPORT  ED Nurse Name and Phone #: Renu Asby [phone]  S Name/Age/Gender Jacob Bass 62 y.o. male Room/Bed: 046C/046C  Code Status   Code Status: Full Code  Home/SNF/Other Skilled nursing facility Patient oriented to: self Is this baseline? Yes   Triage Complete: Triage complete  Chief Complaint Bright red blood per rectum [K62.5]  Triage Note Pt here from a nursing home [facility] ) with c/o rectal bleed , nurse from nursing home told ems that she say some bright red blood today form his buttock    Allergies Allergies  Allergen Reactions   Penicillins Hives   Penicillins Other (See Comments)    Unknown reaction Listed on MAR    Level of Care/Admitting Diagnosis ED Disposition     ED Disposition  Admit   Condition  --   [place]: [place] [[id number]]  Level of Care: Telemetry Medical [104]  May place patient in observation at [facility] or [place] if equivalent level of care is available:: No  Covid Evaluation: Asymptomatic - no recent exposure (last 10 days) testing not required  Diagnosis: Bright red blood per rectum [217572]  Admitting Physician: Lyndee Hensen [id number]  Attending Physician: Lyndee Hensen [id number]          B Medical/Surgery History Past Medical History:  Diagnosis Date   CHF (congestive heart failure) ([place])    Cirrhosis ([place])    COPD (chronic obstructive pulmonary disease) ([place])    ETOH abuse    Gastric ulcer    Hypertension    Stroke [facility])    Past Surgical History:  Procedure Laterality Date   BIOPSY  10/19/2018   Procedure: BIOPSY;  Surgeon: Yetta Flock, MD;  Location: [facility];  Service: Gastroenterology;;   ESOPHAGOGASTRODUODENOSCOPY (EGD) WITH PROPOFOL N/A 10/19/2018   Procedure: ESOPHAGOGASTRODUODENOSCOPY (EGD);  Surgeon: Yetta Flock, MD;  Location: [facility];  Service: Gastroenterology;  Laterality: N/A;   IR PARACENTESIS   09/19/2022   IR PARACENTESIS  09/25/2022   IR PARACENTESIS  10/03/2022   IR PARACENTESIS  10/11/2022   IR PARACENTESIS  10/24/2022     A IV Location/Drains/Wounds Patient Lines/Drains/Airways Status     Active Line/Drains/Airways     Name Placement date Placement time Site Days   Peripheral IV 10/28/22 20 G Anterior;Right Forearm 10/28/22  1803  Forearm  1   Pressure Injury 09/24/22 Heel Left;Posterior Deep Tissue Pressure Injury - Purple or maroon localized area of discolored intact skin or blood-filled blister due to damage of underlying soft tissue from pressure and/or shear. Round, dark brown spot 09/24/22  0903  -- 35   Pressure Injury 10/02/22 Thigh Left;Posterior;Proximal Stage 2 -  Partial thickness loss of dermis presenting as a shallow open injury with a red, pink wound bed without slough. Pink 10/02/22  0700  -- 27   Wound / Incision (Open or Dehisced) 09/24/22 Irritant Dermatitis (Moisture Associated Skin Damage) Buttocks Distal Redness and excoriation inner buttocks, and thighs. 09/24/22  1400  Buttocks  35   Wound / Incision (Open or Dehisced) 09/29/22 Skin tear Knee Anterior;Left PInk and painful 09/29/22  0136  Knee  30   Wound / Incision (Open or Dehisced) 09/29/22 Skin tear Knee Anterior;Left 09/29/22  0130  Knee  30   Wound / Incision (Open or Dehisced) 10/02/22 Non-pressure wound Abdomen Left;Lower Pink 10/02/22  0700  Abdomen  27            Intake/Output Last 24  hours  Intake/Output Summary (Last 24 hours) at 10/29/2022 1100 Last data filed at 10/29/2022 0336 Gross per 24 hour  Intake 1000 ml  Output --  Net 1000 ml    Labs/Imaging Results for orders placed or performed during the hospital encounter of 10/28/22 (from the past 48 hour(s))  CBC with Differential/Platelet     Status: Abnormal   Collection Time: 10/28/22  6:08 PM  Result Value Ref Range   WBC 6.6 4.0 - 10.5 K/uL   RBC 2.63 (L) 4.22 - 5.81 MIL/uL   Hemoglobin 9.1 (L) 13.0 - 17.0 g/dL   HCT 28.2  (L) 39.0 - 52.0 %   MCV 107.2 (H) 80.0 - 100.0 fL   MCH 34.6 (H) 26.0 - 34.0 pg   MCHC 32.3 30.0 - 36.0 g/dL   RDW 16.8 (H) 11.5 - 15.5 %   Platelets 74 (L) 150 - 400 K/uL    Comment: Immature Platelet Fraction may be clinically indicated, consider ordering this additional test [id number] REPEATED TO VERIFY PLATELET COUNT CONFIRMED BY SMEAR    nRBC 0.0 0.0 - 0.2 %   Neutrophils Relative % 58 %   Neutro Abs 3.9 1.7 - 7.7 K/uL   Lymphocytes Relative 23 %   Lymphs Abs 1.6 0.7 - 4.0 K/uL   Monocytes Relative 14 %   Monocytes Absolute 0.9 0.1 - 1.0 K/uL   Eosinophils Relative 3 %   Eosinophils Absolute 0.2 0.0 - 0.5 K/uL   Basophils Relative 1 %   Basophils Absolute 0.0 0.0 - 0.1 K/uL   Immature Granulocytes 1 %   Abs Immature Granulocytes 0.03 0.00 - 0.07 K/uL    Comment: Performed at [facility], [address], [place], [place] [address]  Comprehensive metabolic panel     Status: Abnormal   Collection Time: 10/28/22  6:08 PM  Result Value Ref Range   Sodium 146 (H) 135 - 145 mmol/L   Potassium 4.4 3.5 - 5.1 mmol/L    Comment: HEMOLYSIS AT THIS LEVEL MAY AFFECT RESULT   Chloride 117 (H) 98 - 111 mmol/L   CO2 22 22 - 32 mmol/L   Glucose, Bld 101 (H) 70 - 99 mg/dL    Comment: Glucose reference range applies only to samples taken after fasting for at least 8 hours.   BUN 32 (H) 8 - 23 mg/dL   Creatinine, Ser 1.91 (H) 0.61 - 1.24 mg/dL   Calcium 7.7 (L) 8.9 - 10.3 mg/dL   Total Protein 6.2 (L) 6.5 - 8.1 g/dL   Albumin 1.7 (L) 3.5 - 5.0 g/dL   AST 82 (H) 15 - 41 U/L    Comment: HEMOLYSIS AT THIS LEVEL MAY AFFECT RESULT   ALT 41 0 - 44 U/L    Comment: HEMOLYSIS AT THIS LEVEL MAY AFFECT RESULT   Alkaline Phosphatase 116 38 - 126 U/L   Total Bilirubin 1.0 0.3 - 1.2 mg/dL    Comment: HEMOLYSIS AT THIS LEVEL MAY AFFECT RESULT   GFR, Estimated 39 (L) >60 mL/min    Comment: (NOTE) Calculated using the CKD-EPI Creatinine Equation (2021)    Anion gap 7 5 - 15    Comment:  Performed at [facility], [place] [address] [place], [place] [address]  Type and screen     Status: None   Collection Time: 10/28/22  6:13 PM  Result Value Ref Range   ABO/RH(D) O POS    Antibody Screen NEG    Sample Expiration  10/31/2022,2359 Performed at [facility] [address], [place], [place] [address]   I-stat chem 8, ed     Status: Abnormal   Collection Time: 10/28/22  6:35 PM  Result Value Ref Range   Sodium 151 (H) 135 - 145 mmol/L   Potassium 4.1 3.5 - 5.1 mmol/L   Chloride 116 (H) 98 - 111 mmol/L   BUN 36 (H) 8 - 23 mg/dL   Creatinine, Ser 2.00 (H) 0.61 - 1.24 mg/dL   Glucose, Bld 92 70 - 99 mg/dL    Comment: Glucose reference range applies only to samples taken after fasting for at least 8 hours.   Calcium, Ion 1.08 (L) 1.15 - 1.40 mmol/L   TCO2 25 22 - 32 mmol/L   Hemoglobin 8.5 (L) 13.0 - 17.0 g/dL   HCT 25.0 (L) 39.0 - 52.0 %  Comprehensive metabolic panel     Status: Abnormal   Collection Time: 10/29/22  3:47 AM  Result Value Ref Range   Sodium 147 (H) 135 - 145 mmol/L   Potassium 3.8 3.5 - 5.1 mmol/L   Chloride 116 (H) 98 - 111 mmol/L   CO2 23 22 - 32 mmol/L   Glucose, Bld 111 (H) 70 - 99 mg/dL    Comment: Glucose reference range applies only to samples taken after fasting for at least 8 hours.   BUN 31 (H) 8 - 23 mg/dL   Creatinine, Ser 1.80 (H) 0.61 - 1.24 mg/dL   Calcium 7.6 (L) 8.9 - 10.3 mg/dL   Total Protein 5.9 (L) 6.5 - 8.1 g/dL   Albumin 1.6 (L) 3.5 - 5.0 g/dL   AST 69 (H) 15 - 41 U/L   ALT 40 0 - 44 U/L   Alkaline Phosphatase 103 38 - 126 U/L   Total Bilirubin 0.8 0.3 - 1.2 mg/dL   GFR, Estimated 42 (L) >60 mL/min    Comment: (NOTE) Calculated using the CKD-EPI Creatinine Equation (2021)    Anion gap 8 5 - 15    Comment: Performed at [facility], [place] [address] [place], [place] [address]  CBC     Status: Abnormal   Collection Time: 10/29/22  3:47 AM  Result Value Ref Range   WBC 6.1 4.0 - 10.5 K/uL   RBC 2.36  (L) 4.22 - 5.81 MIL/uL   Hemoglobin 8.3 (L) 13.0 - 17.0 g/dL   HCT 25.2 (L) 39.0 - 52.0 %   MCV 106.8 (H) 80.0 - 100.0 fL   MCH 35.2 (H) 26.0 - 34.0 pg   MCHC 32.9 30.0 - 36.0 g/dL   RDW 16.9 (H) 11.5 - 15.5 %   Platelets 67 (L) 150 - 400 K/uL    Comment: Immature Platelet Fraction may be clinically indicated, consider ordering this additional test [id number] REPEATED TO VERIFY    nRBC 0.0 0.0 - 0.2 %    Comment: Performed at [facility], [place] [address] [place], [place] [address]  Magnesium     Status: None   Collection Time: 10/29/22  3:47 AM  Result Value Ref Range   Magnesium 1.9 1.7 - 2.4 mg/dL    Comment: Performed at [facility] [address] [place], [place] [address]  Ammonia     Status: Abnormal   Collection Time: 10/29/22  3:47 AM  Result Value Ref Range   Ammonia 73 (H) 9 - 35 umol/L    Comment: Performed at [facility], [place],  [place] [address]   DG CHEST PORT 1 VIEW  Result Date: 10/28/2022 CLINICAL DATA:  Pleural effusion. EXAM: PORTABLE CHEST 1 VIEW COMPARISON:  CT performed today FINDINGS: This is a low volume study. The cardiomediastinal silhouette is unremarkable. LEFT LOWER lung opacity is compatible with effusion and atelectasis. There is no evidence of pneumothorax or acute bony abnormality. IMPRESSION: LEFT LOWER lung opacity compatible with effusion and atelectasis as seen on CT earlier today. Electronically Signed   By: Margarette Canada M.D.   On: 10/28/2022 21:14   CT Angio Abd/Pel W and/or Wo Contrast  Result Date: 10/28/2022 CLINICAL DATA:  Lower GI bleed.  Bright red blood from rectum. EXAM: CTA ABDOMEN AND PELVIS WITHOUT AND WITH CONTRAST TECHNIQUE: Multidetector CT imaging of the abdomen and pelvis was performed using the standard protocol during bolus administration of intravenous contrast. Multiplanar reconstructed images and MIPs were obtained and reviewed to evaluate the vascular anatomy. RADIATION DOSE REDUCTION:  This exam was performed according to the departmental dose-optimization program which includes automated exposure control, adjustment of the mA and/or kV according to patient size and/or use of iterative reconstruction technique. CONTRAST:  70m OMNIPAQUE IOHEXOL 350 MG/ML SOLN COMPARISON:  10/23/2022 FINDINGS: VASCULAR Aorta: Normal caliber aorta without aneurysm, dissection, vasculitis or significant stenosis. Moderate calcified atherosclerotic plaque. Celiac: Patent without evidence of aneurysm, dissection, vasculitis or significant stenosis. SMA: Patent without evidence of aneurysm, dissection, vasculitis or significant stenosis. Renals: Both renal arteries are patent without evidence of aneurysm, dissection, vasculitis, fibromuscular dysplasia or significant stenosis. IMA: Patent without evidence of aneurysm, dissection, vasculitis or significant stenosis. Inflow: Patent without aneurysm or dissection. Calcified plaque causes up to mild narrowing in the left common iliac artery and moderate narrowing of the left internal iliac artery. Proximal Outflow: No aneurysm or dissection. Calcified and noncalcified atherosclerotic plaque causes mild-moderate narrowing of the bilateral common femoral, visualized deep femoral, and visualized superficial femoral arteries. Veins: No evidence of venous thrombosis in the IVC or iliac veins. Portal vein is patent. Prominent gastric varices. Review of the MIP images confirms the above findings. NON-VASCULAR Lower chest: Moderate left pleural effusion and associated atelectasis. Hepatobiliary: Nodular hepatic contour compatible with cirrhosis. Mild gallbladder wall thickening likely secondary to cirrhosis. No biliary dilation. Pancreas: Unremarkable Spleen: Normal in size without focal abnormality. Adrenals/Urinary Tract: Normal adrenal glands. No urinary calculi or hydronephrosis. Unremarkable bladder. Stomach/Bowel: Diffuse circumferential wall thickening throughout the colon  and rectum. Diffuse wall thickening of the stomach and small bowel. Respiratory motion obscures more detailed evaluation of the bowel in the upper and central abdomen. Normal appendix. Lymphatic: No definite lymphadenopathy. Reproductive: Unremarkable. Other: Large volume low-density ascites in the abdomen and pelvis. Diffuse mesenteric stranding. Body wall anasarca. Musculoskeletal: Advanced arthritis right hip. No acute osseous abnormality. IMPRESSION: 1. No evidence of active GI bleed. 2. 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 3. Cirrhosis with large volume ascites. 4. Moderate left pleural effusion. 5.  Aortic Atherosclerosis (ICD10-I70.0). Electronically Signed   By: TPlacido SouM.D.   On: 10/28/2022 19:33    Pending Labs Unresulted Labs (From admission, onward)     Start     Ordered   10/28/22 1913  Protime-INR  Once,   STAT        10/28/22 1912            Vitals/Pain Today's Vitals   10/29/22 0615 10/29/22 0618 10/29/22 0715 10/29/22 1015  BP: (!) 95/56 (Marland Kitchen  95/56 (!) 121/52 115/69  Pulse:  67  70  Resp: '14 14 15 17  '$ Temp:  98.1 F (36.7 C)  97.9 F (36.6 C)  TempSrc:  Oral    SpO2:  100%  100%  Weight:  58.5 kg    Height:  '5\' 7"'$  (1.702 m)    PainSc:  0-No pain      Isolation Precautions No active isolations  Medications Medications  lactated ringers infusion (0 mLs Intravenous Stopped 10/29/22 0336)  acetaminophen (TYLENOL) tablet 1,000 mg (has no administration in time range)  metoprolol tartrate (LOPRESSOR) tablet 25 mg (25 mg Oral Given 10/28/22 2201)  lactulose (CHRONULAC) 10 GM/15ML solution 10 g (10 g Oral Given 10/28/22 2204)  pantoprazole (PROTONIX) EC tablet 40 mg (40 mg Oral Given 10/28/22 2202)  senna-docusate (Senokot-S) tablet 1 tablet (has no administration in time range)  tamsulosin (FLOMAX) capsule 0.4 mg (0.4 mg Oral Patient  Refused/Not Given 10/29/22 0717)  cyanocobalamin (VITAMIN B12) tablet 1,000 mcg (1,000 mcg Oral Patient Refused/Not Given [id number])  folic acid (FOLVITE) tablet 1 mg (has no administration in time range)  melatonin tablet 5 mg (5 mg Oral Given 10/28/22 2202)  calcium-vitamin D (OSCAL WITH D) 500-5 MG-MCG per tablet 1 tablet (has no administration in time range)  feeding supplement (ENSURE ENLIVE / ENSURE PLUS) liquid 237 mL (237 mLs Oral Given 10/28/22 2234)  magnesium oxide (MAG-OX) tablet 400 mg (has no administration in time range)  mometasone-formoterol (DULERA) 200-5 MCG/ACT inhaler 2 puff (2 puffs Inhalation Not Given 10/28/22 2134)  ipratropium-albuterol (DUONEB) 0.5-2.5 (3) MG/3ML nebulizer solution 3 mL (has no administration in time range)  umeclidinium bromide (INCRUSE ELLIPTA) 62.5 MCG/ACT 1 puff (1 puff Inhalation Not Given 10/29/22 0718)  latanoprost (XALATAN) 0.005 % ophthalmic solution 1 drop (1 drop Both Eyes Not Given 10/28/22 2347)  Gerhardt's butt cream 1 Application (has no administration in time range)  furosemide (LASIX) injection 40 mg (has no administration in time range)  iohexol (OMNIPAQUE) 350 MG/ML injection 75 mL (75 mLs Intravenous Contrast Given 10/28/22 1832)    Mobility non-ambulatory     Focused Assessments Neuro Assessment Handoff:  Swallow screen pass?  .         Neuro Assessment:   Neuro Checks:      Has TPA been given? No If patient is a Neuro Trauma and patient is going to OR before floor call report to [place] nurse: [phone] or [phone]   R Recommendations: See Admitting Provider Note  Report given to:   Additional Notes: incontinent x2 since 0800-  red muscous stool.  Refused meds, usually takes crushed in apple sauce.  Spit apple sauce at am nurse.

## 2022-10-29 NOTE — Progress Notes (Signed)
PROGRESS NOTE  Jacob Bass    DOB: [birth year], 63 y.o.  [id number]    Code Status: Full Code   DOA: 10/28/2022   LOS: 0   Brief hospital course  Honorio Lader is a 62 y.o. male with a PMH significant for embolic CVA, alcoholic cirrhosis with ascites, hypertension, COPD, macrocytic anemia and dementia.  They presented from SNF to the ED on 10/28/2022 with BRBPR. Per SNF staff report, occurred today and patient denied pain.   In the ED, it was found that they had stable vitals.  Significant findings included CBC with hemoglobin 9.1, platelets 74, WBC 6.6.,  CMP with sodium 146, chloride 117, potassium 4.4, glucose 101, serum creatinine 2, albumin 1.7, AST 82, ALT 41.   CTA chest abdomen pelvis showed no evidence of active bleed, diffuse wall thickening from the stomach to the rectum, large volume ascites with cirrhosis, moderate left pleural effusion.  They were initially treated with lactulose, motoprolol, pantoprazole, LR.   Patient was admitted to medicine service for further workup and management of BRBPR as outlined in detail below.  10/29/22 -minimally responsive but denies pain when pressing on abdomen.  Assessment & Plan  Principal Problem:   Bright red blood per rectum Active Problems:   Anemia, macrocytic   Thrombocytopenia (HCC)   Dysphagia   Hypomagnesemia   COPD (chronic obstructive pulmonary disease) (HCC)   Hypernatremia   HTN (hypertension)   COPD (chronic obstructive pulmonary disease) (HCC)   Cirrhosis (HCC)   Stage 3b chronic kidney disease (CKD) (HCC)  GI bleed-  Hemoglobin stable at 9.1 on presentation with baseline being around 10. EGD 10/2018 positive for gastric ulcer. No recent colonoscopy - GI consulted, appreciate recs  - no intervention at this time. If POA can be identified, can consider, per GI note - continue IV fluids - Protonix '40mg'$  po twice daily - Trend hemoglobin  Alcoholic  cirrhosis with ascites  History of HCV  H/o SBP 07/2022 Known  gastric varices- last IR paracentesis on 10/25/2019. Abdomen is firm and distended on exam - consulted IR for paracentesis - continue Lasix and lactulose  - metabolic panel monitoring   Acute on chronic Anemia- initial hgb 9.1>8.5>8.3 -AM CBC -Continue B12 and folate supplementation   Left pleural effusion  Left lower lobe opacity COPD- no respiratory abnormalities - Continue home Spiriva and switch to [facility] per formulary   Thrombocytopenia- due to chronic liver disease. No active bleeding on exam but had positive blood on DRE by EDP - SCDs for VTE    Dementia  Dysphagia- poor PO intake. PEG placement considered on previous admission but unable to safely place considering significant ascites -SLP  consult placed   Hypomagnesemia -Continue home magnesium supplementation -Check a.m. Mg level    Hypertension He is normotensive on admission (amlodipine, Lasix, metoprolol).  Lisinopril was discontinued at his last hospital admission.  This was not restarted. -Continue home medications:    Acute on chronic kidney disease stage IIIb- initial Cr 1.9>2>1.8. appears stable -Monitor renal function and electrolytes -Avoid renal toxic agents -Renally dose medications as needed  Body mass index is 20.2 kg/m.  VTE ppx: SCDs Start: 10/28/22 2018  Diet:     Diet   Diet NPO time specified   Consultants: GI  Subjective 10/29/22    Pt reports nothing. Unable to engage in conversation but denies pain when palpating his abdomen. Unable to follow commands.    Objective   Vitals:   10/29/22 0549 10/29/22 0615 10/29/22 0618 10/29/22  0715  BP:  (!) 95/56 (!) 95/56 (!) 121/52  Pulse: 67  67   Resp:  '14 14 15  '$ Temp: 98.1 F (36.7 C)  98.1 F (36.7 C)   TempSrc: Axillary  Oral   SpO2: 100%  100%   Weight:   58.5 kg   Height:   '5\' 7"'$  (1.702 m)     Intake/Output Summary (Last 24 hours) at 10/29/2022 0737 Last data filed at 10/29/2022 0336 Gross per 24 hour  Intake 1000 ml   Output --  Net 1000 ml   Filed Weights   10/29/22 0354 10/29/22 0618  Weight: 58.5 kg 58.5 kg     Physical Exam:  General: awake, alert, NAD HEENT: atraumatic, clear conjunctiva, anicteric sclera, MMM, hearing grossly normal Respiratory: normal respiratory effort. Cardiovascular: quick capillary refill, normal S1/S2, RRR, no JVD, murmurs Gastrointestinal: firm, distended. No guarding or rebound Nervous: Alert and minimally responsive. Makes incomprehensible sounds when engaged in conversation. Unable to follow commands Extremities: moves all equally, no edema, muscle wasting Skin: dry, intact, normal temperature, normal color. No rashes, lesions or ulcers on exposed skin  Labs   I have personally reviewed the following labs and imaging studies CBC    Component Value Date/Time   WBC 6.1 10/29/2022 0347   RBC 2.36 (L) 10/29/2022 0347   HGB 8.3 (L) 10/29/2022 0347   HCT 25.2 (L) 10/29/2022 0347   HCT 21.9 (L) 10/10/2018 0702   PLT 67 (L) 10/29/2022 0347   MCV 106.8 (H) 10/29/2022 0347   MCH 35.2 (H) 10/29/2022 0347   MCHC 32.9 10/29/2022 0347   RDW 16.9 (H) 10/29/2022 0347   LYMPHSABS 1.6 10/28/2022 1808   MONOABS 0.9 10/28/2022 1808   EOSABS 0.2 10/28/2022 1808   BASOSABS 0.0 10/28/2022 1808      Latest Ref Rng & Units 10/29/2022    3:47 AM 10/28/2022    6:35 PM 10/28/2022    6:08 PM  BMP  Glucose 70 - 99 mg/dL 111  92  101   BUN 8 - 23 mg/dL 31  36  32   Creatinine 0.61 - 1.24 mg/dL 1.80  2.00  1.91   Sodium 135 - 145 mmol/L 147  151  146   Potassium 3.5 - 5.1 mmol/L 3.8  4.1  4.4   Chloride 98 - 111 mmol/L 116  116  117   CO2 22 - 32 mmol/L 23   22   Calcium 8.9 - 10.3 mg/dL 7.6   7.7     DG CHEST PORT 1 VIEW  Result Date: 10/28/2022 CLINICAL DATA:  Pleural effusion. EXAM: PORTABLE CHEST 1 VIEW COMPARISON:  CT performed today FINDINGS: This is a low volume study. The cardiomediastinal silhouette is unremarkable. LEFT LOWER lung opacity is compatible with  effusion and atelectasis. There is no evidence of pneumothorax or acute bony abnormality. IMPRESSION: LEFT LOWER lung opacity compatible with effusion and atelectasis as seen on CT earlier today. Electronically Signed   By: Margarette Canada M.D.   On: 10/28/2022 21:14   CT Angio Abd/Pel W and/or Wo Contrast  Result Date: 10/28/2022 CLINICAL DATA:  Lower GI bleed.  Bright red blood from rectum. EXAM: CTA ABDOMEN AND PELVIS WITHOUT AND WITH CONTRAST TECHNIQUE: Multidetector CT imaging of the abdomen and pelvis was performed using the standard protocol during bolus administration of intravenous contrast. Multiplanar reconstructed images and MIPs were obtained and reviewed to evaluate the vascular anatomy. RADIATION DOSE REDUCTION: This exam was performed according to the departmental dose-optimization  program which includes automated exposure control, adjustment of the mA and/or kV according to patient size and/or use of iterative reconstruction technique. CONTRAST:  41m OMNIPAQUE IOHEXOL 350 MG/ML SOLN COMPARISON:  10/23/2022 FINDINGS: VASCULAR Aorta: Normal caliber aorta without aneurysm, dissection, vasculitis or significant stenosis. Moderate calcified atherosclerotic plaque. Celiac: Patent without evidence of aneurysm, dissection, vasculitis or significant stenosis. SMA: Patent without evidence of aneurysm, dissection, vasculitis or significant stenosis. Renals: Both renal arteries are patent without evidence of aneurysm, dissection, vasculitis, fibromuscular dysplasia or significant stenosis. IMA: Patent without evidence of aneurysm, dissection, vasculitis or significant stenosis. Inflow: Patent without aneurysm or dissection. Calcified plaque causes up to mild narrowing in the left common iliac artery and moderate narrowing of the left internal iliac artery. Proximal Outflow: No aneurysm or dissection. Calcified and noncalcified atherosclerotic plaque causes mild-moderate narrowing of the bilateral common  femoral, visualized deep femoral, and visualized superficial femoral arteries. Veins: No evidence of venous thrombosis in the IVC or iliac veins. Portal vein is patent. Prominent gastric varices. Review of the MIP images confirms the above findings. NON-VASCULAR Lower chest: Moderate left pleural effusion and associated atelectasis. Hepatobiliary: Nodular hepatic contour compatible with cirrhosis. Mild gallbladder wall thickening likely secondary to cirrhosis. No biliary dilation. Pancreas: Unremarkable Spleen: Normal in size without focal abnormality. Adrenals/Urinary Tract: Normal adrenal glands. No urinary calculi or hydronephrosis. Unremarkable bladder. Stomach/Bowel: Diffuse circumferential wall thickening throughout the colon and rectum. Diffuse wall thickening of the stomach and small bowel. Respiratory motion obscures more detailed evaluation of the bowel in the upper and central abdomen. Normal appendix. Lymphatic: No definite lymphadenopathy. Reproductive: Unremarkable. Other: Large volume low-density ascites in the abdomen and pelvis. Diffuse mesenteric stranding. Body wall anasarca. Musculoskeletal: Advanced arthritis right hip. No acute osseous abnormality. IMPRESSION: 1. No evidence of active GI bleed. 2. 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 3. Cirrhosis with large volume ascites. 4. Moderate left pleural effusion. 5.  Aortic Atherosclerosis (ICD10-I70.0). Electronically Signed   By: TPlacido SouM.D.   On: 10/28/2022 19:33    Disposition Plan & Communication  Patient status: Observation  Admitted From: SNF Planned disposition location: TBD Anticipated discharge date: TBD pending medical stabilization   Family Communication: none on file. H/o POA issues. TOC engaged    Author: CRicharda Osmond DO [facility] 10/29/2022, 7:37 AM   Available by Epic secure  chat 7AM-7PM. If 7PM-7AM, please contact night-coverage.  [facility] contact information found on [email or web address]

## 2022-10-29 NOTE — ED Notes (Signed)
Lasix 20 mg was adm with apple sauce, patient spit the pill out.

## 2022-10-29 NOTE — Evaluation (Signed)
Clinical/Bedside Swallow Evaluation Patient Details  Name: Dawuan Bendolph MRN: [id number] Date of Birth: 1961/03/29  Today's Date: 10/29/2022 Time: SLP Start Time (ACUTE ONLY): 0921 SLP Stop Time (ACUTE ONLY): 0930 SLP Time Calculation (min) (ACUTE ONLY): 9 min  Past Medical History:  Past Medical History:  Diagnosis Date   CHF (congestive heart failure) (HCC)    Cirrhosis (HCC)    COPD (chronic obstructive pulmonary disease) ([place])    ETOH abuse    Gastric ulcer    Hypertension    Stroke [facility])    Past Surgical History:  Past Surgical History:  Procedure Laterality Date   BIOPSY  10/19/2018   Procedure: BIOPSY;  Surgeon: Yetta Flock, MD;  Location: [place];  Service: Gastroenterology;;   ESOPHAGOGASTRODUODENOSCOPY (EGD) WITH PROPOFOL N/A 10/19/2018   Procedure: ESOPHAGOGASTRODUODENOSCOPY (EGD);  Surgeon: Yetta Flock, MD;  Location: [facility] ENDOSCOPY;  Service: Gastroenterology;  Laterality: N/A;   IR PARACENTESIS  09/19/2022   IR PARACENTESIS  09/25/2022   IR PARACENTESIS  10/03/2022   IR PARACENTESIS  10/11/2022   IR PARACENTESIS  10/24/2022   HPI:  62 y.o. male presents to [facility] hospital on 10/28/2022 with bright red blood per rectum. PMH includes CVA, CHF, COPD, cirrhosis, gastric ulcer. Extended hospital stay 1/8 and discharged 10/26/22. Had several MBS with previous recommending puree and thin liquids.    Assessment / Plan / Recommendation  Clinical Impression  Pt known to this therapist after extended stay 1/8-2/23/24 where he had several MBS' with most recent recommending puree (Dys 1) and thin liquids. Ladarrion presented similarily to previous session last week. He was alert, cooperative needing occasional cues. He was unable to follow commands for oromotor exam and laughed at therapist when demonstrated labial/lingual movements. He is endentulous without dentures. Pt demonstrated similar oral phase swallow as last week and able to transit thin and puree with minimal  delays (oral transit significantly improved throughout last admission). Per prior MBS he has a delayed swallow which was suspected this morning although mild. There were no s/s aspiration and attempts to speak (unintelligible) revealed clear vocal quality across thin and puree textures. SLP spoke with PA from GI services who reported pt is cleared to initiate puree texture, thin liquids and crush meds. Per RN pt spit the applesauce/crushed meds when attempted this morning. Pt appears to be functioning at his baseline and no further ST services are warranted at this time. Puree continues to be appropriate for this hospitalization and once back to SNF as he is endentulous. SLP Visit Diagnosis: Dysphagia, unspecified (R13.10)    Aspiration Risk  Mild aspiration risk    Diet Recommendation Dysphagia 1 (Puree);Thin liquid (cleared from GI standpoint for puree/thin)   Liquid Administration via: Straw Medication Administration: Crushed with puree Supervision: Staff to assist with self feeding;Full supervision/cueing for compensatory strategies Compensations: Minimize environmental distractions;Slow rate;Small sips/bites Postural Changes: Seated upright at 90 degrees    Other  Recommendations Oral Care Recommendations: Oral care BID    Recommendations for follow up therapy are one component of a multi-disciplinary discharge planning process, led by the attending physician.  Recommendations may be updated based on patient status, additional functional criteria and insurance authorization.  Follow up Recommendations No SLP follow up      Assistance Recommended at Discharge    Functional Status Assessment Patient has not had a recent decline in their functional status  Frequency and Duration            Prognosis  Swallow Study   General HPI: 62 y.o. male presents to [facility] hospital on 10/28/2022 with bright red blood per rectum. PMH includes CVA, CHF, COPD, cirrhosis, gastric ulcer. Extended  hospital stay 1/8 and discharged 10/26/22. Had several MBS with previous recommending puree and thin liquids. Type of Study: Bedside Swallow Evaluation Previous Swallow Assessment:  (see HPI) Diet Prior to this Study: NPO Temperature Spikes Noted: No Respiratory Status: Room air History of Recent Intubation: No Behavior/Cognition: Alert;Cooperative;Pleasant mood;Requires cueing Oral Cavity Assessment: Within Functional Limits Oral Care Completed by SLP: No Oral Cavity - Dentition: Edentulous Vision: Functional for self-feeding Self-Feeding Abilities: Needs assist Patient Positioning: Upright in bed Baseline Vocal Quality: Normal    Oral/Motor/Sensory Function Overall Oral Motor/Sensory Function: Within functional limits   Ice Chips Ice chips: Not tested   Thin Liquid Thin Liquid: Impaired Presentation: Straw Pharyngeal  Phase Impairments: Suspected delayed Swallow    Nectar Thick Nectar Thick Liquid: Not tested   Honey Thick Honey Thick Liquid: Not tested   Puree Puree: Impaired Pharyngeal Phase Impairments: Suspected delayed Swallow   Solid     Solid: Not tested (on puree prior)      Dorrie Cocuzza, [employer] 10/29/2022,9:58 AM

## 2022-10-29 NOTE — Evaluation (Signed)
Occupational Therapy Evaluation Patient Details Name: Jacob Bass MRN: [id number] DOB: [birth year] Today's Date: 10/29/2022   History of Present Illness 62 y.o. male presents to [facility] hospital on 10/28/2022 with bright red blood per rectum. PMH includes CVA, CHF, COPD, cirrhosis, gastric ulcer.Recently admitted 1/8-2/23.    Clinical Impression   This 62 yo male admitted with above presents to acute OT with PLOF of being max-total A for baisc ADLs and Max-total A for all mobility. He currently is at a mod A level washing face in bed and Max-total A for rolling in bed. He will continue to benefit from acute OT with follow up at SNF.      Recommendations for follow up therapy are one component of a multi-disciplinary discharge planning process, led by the attending physician.  Recommendations may be updated based on patient status, additional functional criteria and insurance authorization.   Follow Up Recommendations  Skilled nursing-short term rehab (<3 hours/day)     Assistance Recommended at Discharge Frequent or constant Supervision/Assistance  Patient can return home with the following Two people to help with walking and/or transfers;Two people to help with bathing/dressing/bathroom;Assistance with feeding;Assistance with cooking/housework;Assist for transportation;Help with stairs or ramp for entrance;Direct supervision/assist for financial management;Direct supervision/assist for medications management    Functional Status Assessment  Patient has had a recent decline in their functional status and demonstrates the ability to make significant improvements in function in a reasonable and predictable amount of time.  Equipment Recommendations  None recommended by OT       Precautions / Restrictions Precautions Precautions: Fall Precaution Comments: baseline L hemiparesis, incontinent Restrictions Weight Bearing Restrictions: No      Mobility Bed Mobility Overal bed mobility: Needs  Assistance Bed Mobility: Rolling Rolling:  (Max A to left, Total A to right)                      ADL either performed or assessed with clinical judgement   ADL Overall ADL's : Needs assistance/impaired Eating/Feeding: NPO   Grooming: Wash/dry face Grooming Details (indicate cue type and reason): max A bed level supine Upper Body Bathing: Total assistance;Bed level   Lower Body Bathing: Total assistance;Bed level   Upper Body Dressing : Total assistance;Bed level   Lower Body Dressing: Total assistance;Bed level                       Vision Patient Visual Report: No change from baseline              Pertinent Vitals/Pain Pain Assessment Pain Assessment: Faces Faces Pain Scale: Hurts even more Pain Location: with movements some joints of LUE (contractures) Pain Descriptors / Indicators: Grimacing Pain Intervention(s): Limited activity within patient's tolerance     Hand Dominance Right   Extremity/Trunk Assessment Upper Extremity Assessment Upper Extremity Assessment: LUE deficits/detail;RUE deficits/detail RUE Deficits / Details: RUE ROM WFL, grossly 4-/5 LUE Deficits / Details: LUE contracted into elbow flexion and shoulder adduction/internal rotation. Digits 1-3 full extension (limited flexion), digits 4 and 5 full flexion (limited extension), wrist full flexion (extension only to neutral), elbow full flexion (no extension at all unless pressure applied to bicep tendon at elbow then only minimal extension (~10 degrees), shoulder really tight in adduction and internal rotation with pt not tolrating attempt for PROM LUE Coordination: decreased fine motor;decreased gross motor     Communication Communication Communication: Expressive difficulties (slow to respond and hard to understand at times)   Cognition Arousal/Alertness:  Awake/alert Behavior During Therapy: Flat affect Overall Cognitive Status: History of cognitive impairments - at baseline                                  General Comments: dementia                Home Living Family/patient expects to be discharged to:: Skilled nursing facility                                 Additional Comments: From [place] per chart      Prior Functioning/Environment Prior Level of Function : Patient poor historian/Family not available             Mobility Comments: In speaking with staff at [facility] they said pta Sep 10, 2022 pt was total A for all basic ADLs and mobility--so he actually had done better than that wtih therapy while he was her from Jan 8-Feb 23. (minguard A roll to L, Max A +2 sidelying to sit and sit>stand with stedy--there was some waxing/waning dependent upon how awake and alert he was ADLs Comments: Per staff at [facility] he was total A for all basic ADLs prior to Jan 8. During his stay here Jan8-Feb 23 he waxed and waned between total A and max A for self feeding, grooming, and UBB.        OT Problem List: Decreased strength;Decreased range of motion;Impaired balance (sitting and/or standing);Impaired UE functional use;Impaired tone;Pain      OT Treatment/Interventions: Self-care/ADL training;Therapeutic exercise;DME and/or AE instruction;Therapeutic activities;Patient/family education    OT Goals(Current goals can be found in the care plan section) Acute Rehab OT Goals Patient Stated Goal: wants some applesauce OT Goal Formulation: With patient Time For Goal Achievement: 11/12/22 Potential to Achieve Goals: Fair  OT Frequency: Min 2X/week       AM-PAC OT "6 Clicks" Daily Activity     Outcome Measure Help from another person eating meals?: A Lot Help from another person taking care of personal grooming?: A Lot Help from another person toileting, which includes using toliet, bedpan, or urinal?: Total Help from another person bathing (including washing, rinsing, drying)?: Total Help from another person to put on  and taking off regular upper body clothing?: Total Help from another person to put on and taking off regular lower body clothing?: Total 6 Click Score: 8   End of Session Nurse Communication:  (pt asking about eating applesauce)  Activity Tolerance: Patient tolerated treatment well Patient left: in bed (with bilateral side rails up on stretcher)  OT Visit Diagnosis: Unsteadiness on feet (R26.81);Other abnormalities of gait and mobility (R26.89);Muscle weakness (generalized) (M62.81);Hemiplegia and hemiparesis;Pain Hemiplegia - Right/Left: Left Hemiplegia - dominant/non-dominant: Non-Dominant Hemiplegia - caused by: Cerebral infarction (old) Pain - Right/Left: Left Pain - part of body: Arm (with attempt at PROM)                Time: [id number] OT Time Calculation (min): 14 min Charges:  OT General Charges $OT Visit: 1 Visit OT Evaluation $OT Eval Moderate Complexity: 1 [place] Office [phone]    Almon Register 10/29/2022, 12:18 PM

## 2022-10-29 NOTE — Evaluation (Signed)
Physical Therapy Evaluation Patient Details Name: Jacob Bass MRN: [id number] DOB: 04/07/1961 Today's Date: 10/29/2022  History of Present Illness  62 y.o. male presents to [facility] hospital on 10/28/2022 with bright red blood per rectum. PMH includes CVA, CHF, COPD, cirrhosis, gastric ulcer.  Clinical Impression  Pt presents to PT with deficits in functional mobility, ROM, strength, power, cognition. PT is initially lethargic, becoming more alert with stimulation. Pt poorly follows commands and requires significant physical assistance to roll in bed for prei-care (incontinent of stool, PT notes bright red blood in stool). Pt will benefit from continued aggressive mobilization in an effort to reduce falls risk and caregiver burden. PT recommends return to SNF when medically ready.       Recommendations for follow up therapy are one component of a multi-disciplinary discharge planning process, led by the attending physician.  Recommendations may be updated based on patient status, additional functional criteria and insurance authorization.  Follow Up Recommendations Skilled nursing-short term rehab (<3 hours/day) Can patient physically be transported by private vehicle: No    Assistance Recommended at Discharge Frequent or constant Supervision/Assistance  Patient can return home with the following  Two people to help with walking and/or transfers;Two people to help with bathing/dressing/bathroom;Assistance with cooking/housework;Assistance with feeding;Direct supervision/assist for medications management;Direct supervision/assist for financial management;Assist for transportation;Help with stairs or ramp for entrance    Equipment Recommendations Wheelchair (measurements PT);Wheelchair cushion (measurements PT);Hospital bed;Other (comment) (hoyer lift)  Recommendations for Other Services       Functional Status Assessment Patient has had a recent decline in their functional status and demonstrates  the ability to make significant improvements in function in a reasonable and predictable amount of time.     Precautions / Restrictions Precautions Precautions: Fall;Other (comment) Precaution Comments: baseline L hemiparesis, incontinent Restrictions Weight Bearing Restrictions: No      Mobility  Bed Mobility Overal bed mobility: Needs Assistance Bed Mobility: Rolling Rolling: Max assist         General bed mobility comments: pt reaches to utilize RUE but requires assistance with use of sheet to scoot hips and support at hips/trunk to roll    Transfers Overall transfer level:  (deferred 2/2 impaired cognition and strength)                      Ambulation/Gait                  Stairs            Wheelchair Mobility    Modified Rankin (Stroke Patients Only)       Balance                                             Pertinent Vitals/Pain Pain Assessment Pain Assessment: Faces Faces Pain Scale: Hurts little more Pain Location: intermittent grimacing with mobility Pain Descriptors / Indicators: Grimacing Pain Intervention(s): Monitored during session    Home Living Family/patient expects to be discharged to:: Skilled nursing facility                   Additional Comments: From Charna Archer per chart    Prior Function Prior Level of Function : Patient poor historian/Family not available             Mobility Comments: patient is minimally verbally communicative this eval, does not report any prior level  of function. Pt recently admitted and was requiring maxA x2 for unsuccessful attempts at standing, also needing physical assistance for bed mobility.       Hand Dominance   Dominant Hand: Right    Extremity/Trunk Assessment   Upper Extremity Assessment Upper Extremity Assessment: LUE deficits/detail;RUE deficits/detail RUE Deficits / Details: RUE ROM WFL, grossly 4-/5 LUE Deficits / Details: LUE contracted  into elbow flexion and shoulder adduction/internal rotation, PROM not fully assessed on eval, pt demonstrates minimal attempts to utilize this UE during session, only with verbal/tactile cues from PT    Lower Extremity Assessment Lower Extremity Assessment: Difficult to assess due to impaired cognition RLE Deficits / Details: poor command following for LE commands. Pt does move BLE spontaneously and involves LEs when attempting to perform bed mobility, unable to formally assess strength due to impaired command following LLE Deficits / Details: poor command following for LE commands. Pt does move BLE spontaneously and involves LEs when attempting to perform bed mobility, unable to formally assess strength due to impaired command following    Cervical / Trunk Assessment Cervical / Trunk Assessment: Kyphotic  Communication   Communication: Expressive difficulties  Cognition Arousal/Alertness: Lethargic (becomes more alert with stimulation but seldom verbally communicates) Behavior During Therapy: Flat affect Overall Cognitive Status: No family/caregiver present to determine baseline cognitive functioning                                 General Comments: slow to process, increased time to follow commands inconsistently        General Comments General comments (skin integrity, edema, etc.): pt incontinent of urine and stool during session, PT notes further bright red blood and mucousy quality of stool    Exercises     Assessment/Plan    PT Assessment Patient needs continued PT services  PT Problem List Decreased strength;Decreased activity tolerance;Decreased balance;Decreased mobility;Decreased coordination;Decreased cognition;Decreased knowledge of use of DME;Decreased safety awareness;Decreased knowledge of precautions       PT Treatment Interventions DME instruction;Gait training;Therapeutic activities;Functional mobility training;Therapeutic exercise;Neuromuscular  re-education;Balance training;Cognitive remediation;Patient/family education;Wheelchair mobility training    PT Goals (Current goals can be found in the Care Plan section)  Acute Rehab PT Goals Patient Stated Goal: pt does not state, PT goal to improve bed mobility quality and sitting balance PT Goal Formulation: Patient unable to participate in goal setting Time For Goal Achievement: 11/12/22 Potential to Achieve Goals: Poor    Frequency Min 2X/week     Co-evaluation               AM-PAC PT "6 Clicks" Mobility  Outcome Measure Help needed turning from your back to your side while in a flat bed without using bedrails?: A Lot Help needed moving from lying on your back to sitting on the side of a flat bed without using bedrails?: Total Help needed moving to and from a bed to a chair (including a wheelchair)?: Total Help needed standing up from a chair using your arms (e.g., wheelchair or bedside chair)?: Total Help needed to walk in hospital room?: Total Help needed climbing 3-5 steps with a railing? : Total 6 Click Score: 7    End of Session   Activity Tolerance: Patient limited by lethargy Patient left: in bed;with call bell/phone within reach Nurse Communication: Mobility status;Need for lift equipment PT Visit Diagnosis: Other abnormalities of gait and mobility (R26.89);Muscle weakness (generalized) (M62.81);Hemiplegia and hemiparesis Hemiplegia - Right/Left: Left  Hemiplegia - dominant/non-dominant: Non-dominant Hemiplegia - caused by: Cerebral infarction    Time: 0827-0852 PT Time Calculation (min) (ACUTE ONLY): 25 min   Charges:   PT Evaluation $PT Eval Low Complexity: 1 Low          Zenaida Niece, PT, DPT Acute Rehabilitation Office [phone]   Zenaida Niece 10/29/2022, 9:04 AM

## 2022-10-30 ENCOUNTER — Inpatient Hospital Stay (HOSPITAL_COMMUNITY): Payer: Medicaid Other

## 2022-10-30 DIAGNOSIS — K922 Gastrointestinal hemorrhage, unspecified: Secondary | ICD-10-CM | POA: Diagnosis not present

## 2022-10-30 DIAGNOSIS — K625 Hemorrhage of anus and rectum: Secondary | ICD-10-CM | POA: Diagnosis not present

## 2022-10-30 HISTORY — PX: IR PARACENTESIS: IMG2679

## 2022-10-30 LAB — COMPREHENSIVE METABOLIC PANEL
ALT: 39 U/L (ref 0–44)
AST: 67 U/L — ABNORMAL HIGH (ref 15–41)
Albumin: 1.5 g/dL — ABNORMAL LOW (ref 3.5–5.0)
Alkaline Phosphatase: 110 U/L (ref 38–126)
Anion gap: 10 (ref 5–15)
BUN: 31 mg/dL — ABNORMAL HIGH (ref 8–23)
CO2: 23 mmol/L (ref 22–32)
Calcium: 8 mg/dL — ABNORMAL LOW (ref 8.9–10.3)
Chloride: 116 mmol/L — ABNORMAL HIGH (ref 98–111)
Creatinine, Ser: 1.85 mg/dL — ABNORMAL HIGH (ref 0.61–1.24)
GFR, Estimated: 40 mL/min — ABNORMAL LOW (ref >60)
Glucose, Bld: 112 mg/dL — ABNORMAL HIGH (ref 70–99)
Potassium: 3.7 mmol/L (ref 3.5–5.1)
Sodium: 149 mmol/L — ABNORMAL HIGH (ref 135–145)
Total Bilirubin: 1 mg/dL (ref 0.3–1.2)
Total Protein: 6.1 g/dL — ABNORMAL LOW (ref 6.5–8.1)

## 2022-10-30 LAB — CBC
HCT: 28.2 % — ABNORMAL LOW (ref 39.0–52.0)
Hemoglobin: 9 g/dL — ABNORMAL LOW (ref 13.0–17.0)
MCH: 34.2 pg — ABNORMAL HIGH (ref 26.0–34.0)
MCHC: 31.9 g/dL (ref 30.0–36.0)
MCV: 107.2 fL — ABNORMAL HIGH (ref 80.0–100.0)
Platelets: 70 10*3/uL — ABNORMAL LOW (ref 150–400)
RBC: 2.63 MIL/uL — ABNORMAL LOW (ref 4.22–5.81)
RDW: 16.9 % — ABNORMAL HIGH (ref 11.5–15.5)
WBC: 7.9 10*3/uL (ref 4.0–10.5)
nRBC: 0 % (ref 0.0–0.2)

## 2022-10-30 LAB — PROTIME-INR
INR: 1.4 — ABNORMAL HIGH (ref 0.8–1.2)
Prothrombin Time: 16.7 seconds — ABNORMAL HIGH (ref 11.4–15.2)

## 2022-10-30 MED ORDER — METOPROLOL TARTRATE 12.5 MG HALF TABLET
12.5000 mg | ORAL_TABLET | Freq: Two times a day (BID) | ORAL | Status: DC
  Administered 2022-10-30 – 2022-10-31 (×2): 12.5 mg via ORAL
  Filled 2022-10-30 (×2): qty 1

## 2022-10-30 MED ORDER — ALBUMIN HUMAN 25 % IV SOLN
50.0000 g | Freq: Once | INTRAVENOUS | Status: AC
  Administered 2022-10-30: 50 g via INTRAVENOUS
  Filled 2022-10-30: qty 200

## 2022-10-30 MED ORDER — ADULT MULTIVITAMIN W/MINERALS CH
1.0000 | ORAL_TABLET | Freq: Every day | ORAL | Status: DC
  Administered 2022-10-30 – 2022-11-02 (×4): 1 via ORAL
  Filled 2022-10-30 (×4): qty 1

## 2022-10-30 MED ORDER — LIDOCAINE HCL 1 % IJ SOLN
INTRAMUSCULAR | Status: AC
  Administered 2022-10-30: 10 mL
  Filled 2022-10-30: qty 20

## 2022-10-30 NOTE — Progress Notes (Addendum)
Patient ID: Jacob Bass, male   DOB: [birth year], 62 y.o.   MRN: [id number]    Progress Note   Subjective   Day # 2 CC; rectal bleeding   HGb 9.0  this am - quite stable INr 1.4 NA149/Creat 1.85  Paracentesis ordered yesterday but not done-  Patient was sleeping soundly, awakens easily still drowsy no complaints of abdominal pain, feels better,unaware of any rectal bleeding   Per social worker notes today, guardianship hearing has been requested, but patient has no official guardian at this time.  Stated that [place] can assist with any decision making if needed    Objective   Vital signs in last 24 hours: Temp:  [97.9 F (36.6 C)-98.5 F (36.9 C)] 98.3 F (36.8 C) (02/27 0445) Pulse Rate:  [64-87] 74 (02/27 0800) Resp:  [14-18] 14 (02/27 0800) BP: (105-141)/(58-83) 105/62 (02/27 0800) SpO2:  [99 %-100 %] 100 % (02/27 0445) Last BM Date : 10/29/22 General:    Older African-American male in NAD chronic facial droop Heart:  Regular rate and rhythm; no murmurs Lungs: Respirations even and unlabored, lungs CTA bilaterally Abdomen: Nontense ascites, nontender no palpable mass or hepatosplenomegaly normal bowel sounds. Extremities:  Without edema. Neurologic:  Alert and oriented, x 2 .  Left sided hemiparesis, mild dysarthria Psych:  Cooperative. Normal mood and affect.  Intake/Output from previous day: 02/26 0701 - 02/27 0700 In: 120 [P.O.:120] Out: 150 [Urine:150] Intake/Output this shift: No intake/output data recorded.  Lab Results: Recent Labs    10/28/22 1808 10/28/22 1835 10/29/22 0347 10/30/22 0358  WBC 6.6  --  6.1 7.9  HGB 9.1* 8.5* 8.3* 9.0*  HCT 28.2* 25.0* 25.2* 28.2*  PLT 74*  --  67* 70*   BMET Recent Labs    10/28/22 1808 10/28/22 1835 10/29/22 0347 10/30/22 0358  NA 146* 151* 147* 149*  K 4.4 4.1 3.8 3.7  CL 117* 116* 116* 116*  CO2 22  --  23 23  GLUCOSE 101* 92 111* 112*  BUN 32* 36* 31* 31*  CREATININE 1.91* 2.00* 1.80* 1.85*  CALCIUM  7.7*  --  7.6* 8.0*   LFT Recent Labs    10/30/22 0358  PROT 6.1*  ALBUMIN 1.5*  AST 67*  ALT 39  ALKPHOS 110  BILITOT 1.0   PT/INR Recent Labs    10/30/22 0358  LABPROT 16.7*  INR 1.4*    Studies/Results: DG CHEST PORT 1 VIEW  Result Date: 10/28/2022 CLINICAL DATA:  Pleural effusion. EXAM: PORTABLE CHEST 1 VIEW COMPARISON:  CT performed today FINDINGS: This is a low volume study. The cardiomediastinal silhouette is unremarkable. LEFT LOWER lung opacity is compatible with effusion and atelectasis. There is no evidence of pneumothorax or acute bony abnormality. IMPRESSION: LEFT LOWER lung opacity compatible with effusion and atelectasis as seen on CT earlier today. Electronically Signed   By: Margarette Canada M.D.   On: 10/28/2022 21:14   CT Angio Abd/Pel W and/or Wo Contrast  Result Date: 10/28/2022 CLINICAL DATA:  Lower GI bleed.  Bright red blood from rectum. EXAM: CTA ABDOMEN AND PELVIS WITHOUT AND WITH CONTRAST TECHNIQUE: Multidetector CT imaging of the abdomen and pelvis was performed using the standard protocol during bolus administration of intravenous contrast. Multiplanar reconstructed images and MIPs were obtained and reviewed to evaluate the vascular anatomy. RADIATION DOSE REDUCTION: This exam was performed according to the departmental dose-optimization program which includes automated exposure control, adjustment of the mA and/or kV according to patient size and/or use of iterative  reconstruction technique. CONTRAST:  14m OMNIPAQUE IOHEXOL 350 MG/ML SOLN COMPARISON:  10/23/2022 FINDINGS: VASCULAR Aorta: Normal caliber aorta without aneurysm, dissection, vasculitis or significant stenosis. Moderate calcified atherosclerotic plaque. Celiac: Patent without evidence of aneurysm, dissection, vasculitis or significant stenosis. SMA: Patent without evidence of aneurysm, dissection, vasculitis or significant stenosis. Renals: Both renal arteries are patent without evidence of aneurysm,  dissection, vasculitis, fibromuscular dysplasia or significant stenosis. IMA: Patent without evidence of aneurysm, dissection, vasculitis or significant stenosis. Inflow: Patent without aneurysm or dissection. Calcified plaque causes up to mild narrowing in the left common iliac artery and moderate narrowing of the left internal iliac artery. Proximal Outflow: No aneurysm or dissection. Calcified and noncalcified atherosclerotic plaque causes mild-moderate narrowing of the bilateral common femoral, visualized deep femoral, and visualized superficial femoral arteries. Veins: No evidence of venous thrombosis in the IVC or iliac veins. Portal vein is patent. Prominent gastric varices. Review of the MIP images confirms the above findings. NON-VASCULAR Lower chest: Moderate left pleural effusion and associated atelectasis. Hepatobiliary: Nodular hepatic contour compatible with cirrhosis. Mild gallbladder wall thickening likely secondary to cirrhosis. No biliary dilation. Pancreas: Unremarkable Spleen: Normal in size without focal abnormality. Adrenals/Urinary Tract: Normal adrenal glands. No urinary calculi or hydronephrosis. Unremarkable bladder. Stomach/Bowel: Diffuse circumferential wall thickening throughout the colon and rectum. Diffuse wall thickening of the stomach and small bowel. Respiratory motion obscures more detailed evaluation of the bowel in the upper and central abdomen. Normal appendix. Lymphatic: No definite lymphadenopathy. Reproductive: Unremarkable. Other: Large volume low-density ascites in the abdomen and pelvis. Diffuse mesenteric stranding. Body wall anasarca. Musculoskeletal: Advanced arthritis right hip. No acute osseous abnormality. IMPRESSION: 1. No evidence of active GI bleed. 2. 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 3. Cirrhosis with large volume ascites. 4. Moderate  left pleural effusion. 5.  Aortic Atherosclerosis (ICD10-I70.0). Electronically Signed   By: TPlacido SouM.D.   On: 10/28/2022 19:33       Assessment / Plan:    #[id number] old African-American male with multiple significant comorbidities, nursing home resident who was just discharged from the hospital last week and readmitted yesterday after he had had a 6-week hospitalization with acute CVA with left-sided deficits.  Hospitalization complicated by his decompensated cirrhosis/hep C and EtOH related and he did undergo several paracenteses. To the emergency room after staff noticed some bright red blood in his depends  He has not manifested any active bleeding here and hemoglobin is very stable Note he had been evaluated by GI during his last hospitalization in January and had a couple of episodes of very low-volume hematochezia then but no drop in hemoglobin and endoscopic intervention was not entertained as he was stable, unable to consent.  Bleeding was felt consistent with hemorrhoidal bleeding. He had also declined to drink a bowel prep in November 2023.   It was my understanding from the prior notes that POA/guardianship needed to be established so that palliative care could get involved with the patient given his multiple comorbidities.  Per social services today there is a guardianship hearing requested but he does not have current legal guardianship.  CT angiography on admission showed diffuse possible wall thickening of most of the colon rectum and stomach, however with anasarca and ascites this is likely reflective of IT/chronic liver disease.  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  With lack of POA/guardianship may be best to defer.      Principal Problem:   Bright red blood per rectum Active Problems:   Anemia, macrocytic   Thrombocytopenia (HCC)   Dysphagia   Hypomagnesemia   COPD (chronic obstructive pulmonary disease) (HCC)   Hypernatremia   HTN (hypertension)   COPD (chronic obstructive pulmonary disease) (HCC)   Cirrhosis (HCC)   Stage 3b chronic kidney disease (CKD) ([place])   Gastrointestinal hemorrhage     LOS: 1 day   Fey Coghill PA-C 10/30/2022, 10:33 AM

## 2022-10-30 NOTE — Progress Notes (Signed)
Patient lacks capacity and ability to consent for non-urgent procedures.  There is no guardian or HCPOA in place.  Per TOC team, there is a scheduled hearing to receive a guardian.  Attempted to contact DSS caseworker Jacob Bass to assist with decision making with no response.  In the setting of significant abdominal tension and distension with indicated pain to palpation, would recommend proceeding with a therapeutic and diagnostic paracentesis at this time to avoid worsening and further complications.

## 2022-10-30 NOTE — Procedures (Signed)
PROCEDURE SUMMARY:  Successful US guided paracentesis from right lateral abdomen.  Yielded 1.5 liters of clear yellow fluid.  No immediate complications.  Patient tolerated well.  EBL = trace  Averianna Brugger S Philis Doke PA-C 10/30/2022 3:50 PM

## 2022-10-30 NOTE — Progress Notes (Signed)
Initial Nutrition Assessment  DOCUMENTATION CODES:   Not applicable  INTERVENTION:   Multivitamin w/ minerals daily Continue Ensure Enlive po TID, each supplement provides 350 kcal and 20 grams of protein. Mighty Shake TID with meals, each supplement provides 330 kcals and 9 grams of protein Feeding assist with all meals   NUTRITION DIAGNOSIS:   Increased nutrient needs related to chronic illness as evidenced by estimated needs.  GOAL:   Patient will meet greater than or equal to 90% of their needs  MONITOR:   PO intake, Supplement acceptance, Labs, I & O's, Skin  REASON FOR ASSESSMENT:   Malnutrition Screening Tool    ASSESSMENT:   62 y.o. male presented to the ED with bright red blood per rectum. Pt recently discharge 10/26/22 after extended admission due to CVA with L side weakness and dysphagia. PMH includes COPD, CHF, HTN, EtOH abuse, CKD IIIb, and cirrhosis with ascites and gastric varices. Pt admitted with GI bleed.   Pt out of room at RD attempt to assess pt. Discussed with RN, reports that pt ate well for lunch and drank all of the Ensure.  Per EMR, pt has had a 21.8% weight loss within the past three months, this is clinically significant for time frame. Suspect some degree of malnutrition due to significant weight loss, although unable to diagnosis at this time due to lack of nutrition focused physical exam.    Meal Intake 2/26-2/27: 25-100% x 2 meals  Medications reviewed and include: Calcium-Vitamin D, Vitamin 123456, Folic acid, Lactulose, Magnesium Oxide, Melatonin, Protonix, IV antibiotics  Labs reviewed: Sodium 149, BUN 31, Creatinine 1.85   NUTRITION - FOCUSED PHYSICAL EXAM:  Deferred to follow-up.  Diet Order:   Diet Order             DIET - DYS 1 Room service appropriate? No; Fluid consistency: Thin  Diet effective now                  EDUCATION NEEDS:   Not appropriate for education at this time  Skin:  Skin Assessment: Reviewed RN  Assessment  Last BM:  2/27 - Type 6, large  Height:  Ht Readings from Last 1 Encounters:  10/29/22 '5\' 7"'$  (1.702 m)   Weight:  Wt Readings from Last 1 Encounters:  10/29/22 58.5 kg   Ideal Body Weight:  67.3 kg  BMI:  Body mass index is 20.2 kg/m.  Estimated Nutritional Needs:  Kcal:  1900-2100 Protein:  90-105 grams Fluid:  >/= 1.9 L   Hermina Barters RD, LDN Clinical Dietitian See [facility] for contact information.

## 2022-10-30 NOTE — TOC Initial Note (Signed)
Transition of Care [facility]) - Initial/Assessment Note    Patient Details  Name: Jacob Bass MRN: [id number] Date of Birth: [birth year]  Transition of [facility]) CM/SW Contact:    Benard Halsted, LCSW Phone Number: 10/30/2022, 9:18 AM  Clinical Narrative:                 CSW spoke with Department of Social Services [place], [phone]). She reported a Guardianship Hearing has been requested but patient has no official Guardian at this time. She stated she/DSS can assist with any decision making if needed and requested to be kept updated.   Expected Discharge Plan: Skilled Nursing Facility Barriers to Discharge: Continued Medical Work up   Patient Goals and CMS Choice Patient states their goals for this hospitalization and ongoing recovery are:: Not oriented CMS Medicare.gov Compare Post Acute Care list provided to:: Other (Comment Required) Choice offered to / list presented to : NA [place] ownership interest in [facility].provided to::  (APS)    Expected Discharge Plan and Services In-house Referral: Clinical Social Work   Post Acute Care Choice: [place] Living arrangements for the past 2 months: [place]                                      Prior Living Arrangements/Services Living arrangements for the past 2 months: [place] Lives with:: Facility Resident Patient language and need for interpreter reviewed:: Yes Do you feel safe going back to the place where you live?: Yes      Need for Family Participation in Patient Care: Yes (Comment) Care giver support system in place?: No (comment)   Criminal Activity/Legal Involvement Pertinent to Current Situation/Hospitalization: No - Comment as needed  Activities of Daily Living Home Assistive Devices/Equipment: CBG Meter ADL Screening (condition at time of admission) Patient's cognitive ability adequate to safely complete daily activities?: No Is the  patient deaf or have difficulty hearing?: No Does the patient have difficulty seeing, even when wearing glasses/contacts?: No Does the patient have difficulty concentrating, remembering, or making decisions?: Yes Patient able to express need for assistance with ADLs?: No Does the patient have difficulty dressing or bathing?: Yes Independently performs ADLs?: No Communication: Dependent Is this a change from baseline?: Pre-admission baseline Dressing (OT): Dependent Is this a change from baseline?: Pre-admission baseline Grooming: Dependent Is this a change from baseline?: Pre-admission baseline Feeding: Needs assistance Is this a change from baseline?: Pre-admission baseline Bathing: Dependent Is this a change from baseline?: Pre-admission baseline Toileting: Dependent Is this a change from baseline?: Pre-admission baseline In/Out Bed: Dependent Is this a change from baseline?: Pre-admission baseline Walks in Home: Dependent Is this a change from baseline?: Pre-admission baseline Does the patient have difficulty walking or climbing stairs?: Yes Weakness of Legs: Both Weakness of Arms/Hands: Both  Permission Sought/Granted Permission sought to share information with : Facility Sport and exercise psychologist, Family Supports Permission granted to share information with : No  Share Information with NAME: APS Patricia [place], [phone])  Permission granted to share info w AGENCY: [facility] granted to share info w Relationship: APS     Emotional Assessment Appearance:: Appears stated age Attitude/Demeanor/Rapport: Unable to Assess Affect (typically observed): Unable to Assess Orientation: :  (Follows commands) Alcohol / Substance Use: Not Applicable Psych Involvement: No (comment)  Admission diagnosis:  Bright red blood per rectum [K62.5] Gastrointestinal hemorrhage, unspecified gastrointestinal hemorrhage  type [K92.2] Patient Active Problem List   Diagnosis Date  Noted   Gastrointestinal hemorrhage 10/29/2022   Bright red blood per rectum 10/28/2022   Stage 3b chronic kidney disease (CKD) ([place]) 10/28/2022   Heme positive stool 09/27/2022   Malnutrition of moderate degree 09/14/2022   Stroke (cerebrum) ([place]) 09/11/2022   Acute CVA (cerebrovascular accident) ([place]) 09/10/2022   History of CVA (cerebrovascular accident) 09/10/2022   HTN (hypertension) 09/10/2022   COPD (chronic obstructive pulmonary disease) ([place]) 09/10/2022   Anemia 09/10/2022   Cirrhosis ([place]) 09/10/2022   PUD (peptic ulcer disease) 09/10/2022   Hypernatremia 07/06/2022   Encephalopathy 07/01/2022   Altered mental status 06/30/2022   Bilateral lower extremity edema 06/30/2022   Poor fluid intake 06/30/2022   Hypoglycemia    Metabolic encephalopathy    Goals of care, counseling/discussion    Failure to thrive in adult 04/12/2022   Hypokalemia    Generalized weakness    Hypomagnesemia    COPD (chronic obstructive pulmonary disease) ([place])    Dysphagia    Fall    Gastric ulcer    Cerebral thrombosis with cerebral infarction 10/13/2018   Cerebral embolism with cerebral infarction 10/13/2018   Subarachnoid hemorrhage 10/13/2018   Intracerebral hemorrhage 10/13/2018   Heme positive stool    Cirrhosis of liver without ascites ([place])    Chest pain 10/09/2018   Elevated troponin 10/09/2018   EKG abnormalities 10/09/2018   Homelessness 10/09/2018   Hypertension 10/09/2018   Anemia, macrocytic 10/09/2018   Thrombocytopenia ([place]) 10/09/2018   PCP:  Tyrone Schimke., MD Pharmacy:   [place], [place] [address] [place] [address] Phone: [phone] Fax: [phone]  [facility] [address] [place] [address] Phone: [phone] Fax: [phone]     Social Determinants of Health (SDOH) Social History: SDOH Screenings   Food Insecurity: Unknown (10/29/2022)   Transportation Needs: Unknown (10/29/2022)  Utilities: Unknown (10/29/2022)  Tobacco Use: High Risk (10/28/2022)   SDOH Interventions:     Readmission Risk Interventions    10/30/2022    9:16 AM  Readmission Risk Prevention Plan  Transportation Screening Complete  Medication Review (RN Care Manager) Complete  PCP or Specialist appointment within 3-5 days of discharge Complete  HRI or Home Care Consult Complete  SW Recovery Care/Counseling Consult Complete  Palliative Care Screening Not [place] Complete

## 2022-10-30 NOTE — Progress Notes (Signed)
PROGRESS NOTE  Jacob Bass    DOB: 10-10-1960, 62 y.o.  [id number]    Code Status: Full Code   DOA: 10/28/2022   LOS: 1   Brief hospital course  Jacob Bass is a 62 y.o. male with a PMH significant for embolic CVA, alcoholic cirrhosis with ascites, hypertension, COPD, macrocytic anemia and dementia.  They presented from SNF to the ED on 10/28/2022 with BRBPR. Per SNF staff report, occurred today and patient denied pain.   In the ED, it was found that they had stable vitals.  Significant findings included CBC with hemoglobin 9.1, platelets 74, WBC 6.6.,  CMP with sodium 146, chloride 117, potassium 4.4, glucose 101, serum creatinine 2, albumin 1.7, AST 82, ALT 41.   CTA chest abdomen pelvis showed no evidence of active bleed, diffuse wall thickening from the stomach to the rectum, large volume ascites with cirrhosis, moderate left pleural effusion.  They were initially treated with lactulose, motoprolol, pantoprazole, LR.   Patient was admitted to medicine service for further workup and management of BRBPR as outlined in detail below.  2/26- evaluated by GI and not good candidate for invasive procedures. Hemodynamically stable and hgb stable. Ordered paracentesis.   10/30/22 -responds when pressing on abdomen to push my hand away indicating pain. No other verbal response. No new bleeding.   Assessment & Plan  Principal Problem:   Bright red blood per rectum Active Problems:   Anemia, macrocytic   Thrombocytopenia (HCC)   Dysphagia   Hypomagnesemia   COPD (chronic obstructive pulmonary disease) (HCC)   Hypernatremia   HTN (hypertension)   COPD (chronic obstructive pulmonary disease) (HCC)   Cirrhosis (HCC)   Stage 3b chronic kidney disease (CKD) (HCC)   Gastrointestinal hemorrhage  GI bleed-  Hemoglobin on presentation 9.1>8.5>8.3>9.0. EGD 10/2018 positive for gastric ulcer. No recent colonoscopy. Per GI, not a good candidate for colonoscopy at this time for several reasons  including not able to consent and no guardian in place. No new bleeding.  - GI consulted, appreciate recs  - no intervention at this time. If POA can be identified, can consider, per GI note - Protonix '40mg'$  po twice daily - Trend hemoglobin  Alcoholic  cirrhosis with ascites  History of HCV  H/o SBP 07/2022 Known gastric varices- last IR paracentesis on 10/25/2019. Abdomen is firm and distended on exam. He pushed my hand away when pressing on his abdomen today indicating pain. INR stable at 1.4 - consulted IR for paracentesis - continue Lasix and lactulose  - metabolic panel monitoring   Acute on chronic Anemia- initial hgb 9.1>8.5>8.3>9.0 -AM CBC -Continue B12 and folate supplementation   Left pleural effusion  Left lower lobe opacity COPD- no respiratory abnormalities - Continue home Spiriva and switch to [facility] per formulary   Thrombocytopenia- stable 74>67>70. due to chronic liver disease. No active bleeding on exam but had positive blood on DRE by EDP - SCDs for VTE    Dementia  Dysphagia- poor PO intake. PEG placement considered on previous admission but unable to safely place considering significant ascites -SLP  consult placed   Hypomagnesemia -Continue home magnesium supplementation -Check a.m. Mg level    Hypertension He is normotensive to hypotensive on admission. Lisinopril was discontinued at his last hospital admission.  This was not restarted. -decrease home metoprolol    Acute on chronic kidney disease stage IIIb- initial Cr 1.9>2>1.8. appears stable -Monitor renal function and electrolytes -Avoid renal toxic agents -Renally dose medications as needed  Body mass  index is 20.2 kg/m.  VTE ppx: SCDs Start: 10/28/22 2018  Diet:     Diet   DIET - DYS 1 Room service appropriate? No; Fluid consistency: Thin   Consultants: GI  Subjective 10/30/22    Pt reports nothing. Indicated pain with abdomen palpation.    Objective   Vitals:   10/29/22 2000  10/29/22 2129 10/30/22 0050 10/30/22 0445  BP: (!) 113/58 133/64 (!) 141/83 (!) 113/58  Pulse: 83 87 64 73  Resp: '17  18 17  '$ Temp: 97.9 F (36.6 C)  98.1 F (36.7 C) 98.3 F (36.8 C)  TempSrc: Oral  Axillary Axillary  SpO2: 99%  100% 100%  Weight:      Height:        Intake/Output Summary (Last 24 hours) at 10/30/2022 0805 Last data filed at 10/30/2022 0602 Gross per 24 hour  Intake 120 ml  Output 150 ml  Net -30 ml    Filed Weights   10/29/22 0354 10/29/22 0618  Weight: 58.5 kg 58.5 kg     Physical Exam:  General: awake, alert, NAD HEENT: atraumatic, clear conjunctiva, anicteric sclera, MMM, hearing grossly normal Respiratory: normal respiratory effort. Cardiovascular: quick capillary refill, normal S1/S2, RRR, no JVD, murmurs Gastrointestinal: firm, distended. positive guarding Nervous: Alert and minimally responsive. Makes incomprehensible sounds when engaged in conversation. Unable to follow commands Extremities: moves all equally, no edema, muscle wasting Skin: dry, intact, normal temperature, normal color. No rashes, lesions or ulcers on exposed skin  Labs   I have personally reviewed the following labs and imaging studies CBC    Component Value Date/Time   WBC 7.9 10/30/2022 0358   RBC 2.63 (L) 10/30/2022 0358   HGB 9.0 (L) 10/30/2022 0358   HCT 28.2 (L) 10/30/2022 0358   HCT 21.9 (L) 10/10/2018 0702   PLT 70 (L) 10/30/2022 0358   MCV 107.2 (H) 10/30/2022 0358   MCH 34.2 (H) 10/30/2022 0358   MCHC 31.9 10/30/2022 0358   RDW 16.9 (H) 10/30/2022 0358   LYMPHSABS 1.6 10/28/2022 1808   MONOABS 0.9 10/28/2022 1808   EOSABS 0.2 10/28/2022 1808   BASOSABS 0.0 10/28/2022 1808      Latest Ref Rng & Units 10/30/2022    3:58 AM 10/29/2022    3:47 AM 10/28/2022    6:35 PM  BMP  Glucose 70 - 99 mg/dL 112  111  92   BUN 8 - 23 mg/dL 31  31  36   Creatinine 0.61 - 1.24 mg/dL 1.85  1.80  2.00   Sodium 135 - 145 mmol/L 149  147  151   Potassium 3.5 - 5.1 mmol/L 3.7   3.8  4.1   Chloride 98 - 111 mmol/L 116  116  116   CO2 22 - 32 mmol/L 23  23    Calcium 8.9 - 10.3 mg/dL 8.0  7.6      DG CHEST PORT 1 VIEW  Result Date: 10/28/2022 CLINICAL DATA:  Pleural effusion. EXAM: PORTABLE CHEST 1 VIEW COMPARISON:  CT performed today FINDINGS: This is a low volume study. The cardiomediastinal silhouette is unremarkable. LEFT LOWER lung opacity is compatible with effusion and atelectasis. There is no evidence of pneumothorax or acute bony abnormality. IMPRESSION: LEFT LOWER lung opacity compatible with effusion and atelectasis as seen on CT earlier today. Electronically Signed   By: Margarette Canada M.D.   On: 10/28/2022 21:14   CT Angio Abd/Pel W and/or Wo Contrast  Result Date: 10/28/2022 CLINICAL DATA:  Lower  GI bleed.  Bright red blood from rectum. EXAM: CTA ABDOMEN AND PELVIS WITHOUT AND WITH CONTRAST TECHNIQUE: Multidetector CT imaging of the abdomen and pelvis was performed using the standard protocol during bolus administration of intravenous contrast. Multiplanar reconstructed images and MIPs were obtained and reviewed to evaluate the vascular anatomy. RADIATION DOSE REDUCTION: This exam was performed according to the departmental dose-optimization program which includes automated exposure control, adjustment of the mA and/or kV according to patient size and/or use of iterative reconstruction technique. CONTRAST:  1m OMNIPAQUE IOHEXOL 350 MG/ML SOLN COMPARISON:  10/23/2022 FINDINGS: VASCULAR Aorta: Normal caliber aorta without aneurysm, dissection, vasculitis or significant stenosis. Moderate calcified atherosclerotic plaque. Celiac: Patent without evidence of aneurysm, dissection, vasculitis or significant stenosis. SMA: Patent without evidence of aneurysm, dissection, vasculitis or significant stenosis. Renals: Both renal arteries are patent without evidence of aneurysm, dissection, vasculitis, fibromuscular dysplasia or significant stenosis. IMA: Patent without evidence  of aneurysm, dissection, vasculitis or significant stenosis. Inflow: Patent without aneurysm or dissection. Calcified plaque causes up to mild narrowing in the left common iliac artery and moderate narrowing of the left internal iliac artery. Proximal Outflow: No aneurysm or dissection. Calcified and noncalcified atherosclerotic plaque causes mild-moderate narrowing of the bilateral common femoral, visualized deep femoral, and visualized superficial femoral arteries. Veins: No evidence of venous thrombosis in the IVC or iliac veins. Portal vein is patent. Prominent gastric varices. Review of the MIP images confirms the above findings. NON-VASCULAR Lower chest: Moderate left pleural effusion and associated atelectasis. Hepatobiliary: Nodular hepatic contour compatible with cirrhosis. Mild gallbladder wall thickening likely secondary to cirrhosis. No biliary dilation. Pancreas: Unremarkable Spleen: Normal in size without focal abnormality. Adrenals/Urinary Tract: Normal adrenal glands. No urinary calculi or hydronephrosis. Unremarkable bladder. Stomach/Bowel: Diffuse circumferential wall thickening throughout the colon and rectum. Diffuse wall thickening of the stomach and small bowel. Respiratory motion obscures more detailed evaluation of the bowel in the upper and central abdomen. Normal appendix. Lymphatic: No definite lymphadenopathy. Reproductive: Unremarkable. Other: Large volume low-density ascites in the abdomen and pelvis. Diffuse mesenteric stranding. Body wall anasarca. Musculoskeletal: Advanced arthritis right hip. No acute osseous abnormality. IMPRESSION: 1. No evidence of active GI bleed. 2. 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 3. Cirrhosis with large volume ascites. 4. Moderate left pleural effusion. 5.  Aortic Atherosclerosis (ICD10-I70.0). Electronically Signed   By: TPlacido SouM.D.   On: 10/28/2022 19:33    Disposition Plan & Communication  Patient status: Inpatient  Admitted From: SNF Planned disposition location: TBD Anticipated discharge date: TBD pending medical stabilization   Family Communication: none on file. H/o POA issues. TOC engaged    Author: CRicharda Osmond DO [facility] 10/30/2022, 8:05 AM   Available by Epic secure chat 7AM-7PM. If 7PM-7AM, please contact night-coverage.  [facility] contact information found on [email or web address]

## 2022-10-31 ENCOUNTER — Inpatient Hospital Stay (HOSPITAL_COMMUNITY): Payer: Medicaid Other

## 2022-10-31 DIAGNOSIS — R1312 Dysphagia, oropharyngeal phase: Secondary | ICD-10-CM | POA: Diagnosis not present

## 2022-10-31 DIAGNOSIS — N1832 Chronic kidney disease, stage 3b: Secondary | ICD-10-CM | POA: Diagnosis not present

## 2022-10-31 DIAGNOSIS — K7031 Alcoholic cirrhosis of liver with ascites: Secondary | ICD-10-CM | POA: Diagnosis not present

## 2022-10-31 DIAGNOSIS — K625 Hemorrhage of anus and rectum: Secondary | ICD-10-CM | POA: Diagnosis not present

## 2022-10-31 DIAGNOSIS — K922 Gastrointestinal hemorrhage, unspecified: Secondary | ICD-10-CM | POA: Diagnosis not present

## 2022-10-31 LAB — CBC
HCT: 21.6 % — ABNORMAL LOW (ref 39.0–52.0)
HCT: 31.1 % — ABNORMAL LOW (ref 39.0–52.0)
Hemoglobin: 7 g/dL — ABNORMAL LOW (ref 13.0–17.0)
Hemoglobin: 10.5 g/dL — ABNORMAL LOW (ref 13.0–17.0)
MCH: 35 pg — ABNORMAL HIGH (ref 26.0–34.0)
MCH: 33.3 pg (ref 26.0–34.0)
MCHC: 32.4 g/dL (ref 30.0–36.0)
MCHC: 33.8 g/dL (ref 30.0–36.0)
MCV: 108 fL — ABNORMAL HIGH (ref 80.0–100.0)
MCV: 98.7 fL (ref 80.0–100.0)
Platelets: 52 10*3/uL — ABNORMAL LOW (ref 150–400)
Platelets: 51 10*3/uL — ABNORMAL LOW (ref 150–400)
RBC: 2 MIL/uL — ABNORMAL LOW (ref 4.22–5.81)
RBC: 3.15 MIL/uL — ABNORMAL LOW (ref 4.22–5.81)
RDW: 16.9 % — ABNORMAL HIGH (ref 11.5–15.5)
RDW: 20.5 % — ABNORMAL HIGH (ref 11.5–15.5)
WBC: 5.8 10*3/uL (ref 4.0–10.5)
WBC: 5.1 10*3/uL (ref 4.0–10.5)
nRBC: 0 % (ref 0.0–0.2)
nRBC: 0 % (ref 0.0–0.2)

## 2022-10-31 LAB — COMPREHENSIVE METABOLIC PANEL
ALT: 27 U/L (ref 0–44)
AST: 47 U/L — ABNORMAL HIGH (ref 15–41)
Albumin: 2.3 g/dL — ABNORMAL LOW (ref 3.5–5.0)
Alkaline Phosphatase: 76 U/L (ref 38–126)
Anion gap: 8 (ref 5–15)
BUN: 28 mg/dL — ABNORMAL HIGH (ref 8–23)
CO2: 24 mmol/L (ref 22–32)
Calcium: 8 mg/dL — ABNORMAL LOW (ref 8.9–10.3)
Chloride: 118 mmol/L — ABNORMAL HIGH (ref 98–111)
Creatinine, Ser: 1.74 mg/dL — ABNORMAL HIGH (ref 0.61–1.24)
GFR, Estimated: 44 mL/min — ABNORMAL LOW (ref >60)
Glucose, Bld: 94 mg/dL (ref 70–99)
Potassium: 3.5 mmol/L (ref 3.5–5.1)
Sodium: 150 mmol/L — ABNORMAL HIGH (ref 135–145)
Total Bilirubin: 0.8 mg/dL (ref 0.3–1.2)
Total Protein: 5.5 g/dL — ABNORMAL LOW (ref 6.5–8.1)

## 2022-10-31 LAB — PREPARE RBC (CROSSMATCH)

## 2022-10-31 MED ORDER — IOHEXOL 350 MG/ML SOLN
85.0000 mL | Freq: Once | INTRAVENOUS | Status: AC | PRN
  Administered 2022-10-31: 85 mL via INTRAVENOUS

## 2022-10-31 MED ORDER — ACETAMINOPHEN 325 MG PO TABS
650.0000 mg | ORAL_TABLET | Freq: Once | ORAL | Status: AC
  Administered 2022-10-31: 650 mg via ORAL
  Filled 2022-10-31: qty 2

## 2022-10-31 MED ORDER — SODIUM CHLORIDE 0.9% IV SOLUTION: Freq: Once | INTRAVENOUS | Status: AC

## 2022-10-31 MED ORDER — DEXTROSE 5 % IV SOLN: INTRAVENOUS | Status: DC

## 2022-10-31 MED ORDER — DIPHENHYDRAMINE HCL 25 MG PO CAPS
25.0000 mg | ORAL_CAPSULE | Freq: Once | ORAL | Status: AC
  Administered 2022-10-31: 25 mg via ORAL
  Filled 2022-10-31: qty 1

## 2022-10-31 NOTE — Progress Notes (Addendum)
Daily Progress Note  DOA: 10/28/2022 Hospital Day: 4  Chief Complaint: decompensated cirrhosis, rectal bleeding  ASSESSMENT  # 62 yo male, nursing home resident with decompensated cirrhosis.  - Had 1.5 liter paracentesis yesterday, appears fluid studies not done  - Has mild asterixis on exam  - Continue cipro for SBP prophylaxis - Not on diuretics due to AKI  # Hematochezia. Also hematochezia during hospitalization in January. Seen by GI. No concurrent drop in hgb and endoscopic evaluation wasn't done.   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 - Today: had an episode of hematochezia with drop in hgb this am.  Addendum: he had another episode of bleeding. TRH has ordered a stat CTA  # Acute on chronic anemia.  - Hgb has declined overnight from 9 to 7 ( prior to that it had been stable).   # AKI on CKD stage 3b  # Recent CVA with neurogenic dysphagia   # Dementia  PLAN:  - [facility] trying to get consent for blood transfusion.  - Not a good colonoscopy candidate due to multiple co-morbidities and would be difficult to prep ( would need NGT / restraints). Additionally if seems there is not a POA at this point. If he continues to bleed then may need another CT angiography. He has had two negative ones though the first study was severely limited.  - EGD for varices screening is not being pursued right now.  - Continue lactulose 10 grams BID - Continue BID PPI - Abdomen is distended. If renal function improves can do a therapeutic paracentesis.    Imaging:  IR Paracentesis  Result Date: 10/30/2022 INDICATION: Recurrent ascites secondary to cirrhosis. Request for therapeutic paracentesis. EXAM: ULTRASOUND GUIDED PARACENTESIS MEDICATIONS: 1% lidocaine 10 mL COMPLICATIONS: None immediate.  PROCEDURE: Informed written consent was obtained from the patient after a discussion of the risks, benefits and alternatives to treatment. A timeout was performed prior to the initiation of the procedure. Initial ultrasound scanning demonstrates a small amount of ascites within the right lower abdominal quadrant. The right lower abdomen was prepped and draped in the usual sterile fashion. 1% lidocaine was used for local anesthesia. Following this, a 19 gauge, 7-cm, Yueh catheter was introduced. An ultrasound image was saved for documentation purposes. The paracentesis was performed. The catheter was removed and a dressing was applied. The patient tolerated the procedure well without immediate post procedural complication. FINDINGS: A total of approximately 1.5 L of clear yellow fluid was removed. IMPRESSION: Successful ultrasound-guided paracentesis yielding 1.5 liters of peritoneal fluid. Procedure performed by: Gareth Eagle, PA-C Electronically Signed   By: Albin Felling M.D.   On: 10/30/2022 16:43     Lab Results: Recent Labs    10/29/22 0347 10/30/22 0358 10/31/22 0258  WBC 6.1 7.9 5.8  HGB 8.3* 9.0* 7.0*  HCT 25.2* 28.2* 21.6*  PLT 67* 70* 52*   BMET Recent Labs    10/29/22 0347 10/30/22 0358 10/31/22 0258  NA 147* 149* 150*  K 3.8 3.7 3.5  CL 116* 116* 118*  CO2 '23 23 24  '$ GLUCOSE 111* 112* 94  BUN 31* 31* 28*  CREATININE 1.80* 1.85* 1.74*  CALCIUM 7.6* 8.0* 8.0*   LFT Recent Labs    10/31/22 0258  PROT 5.5*  ALBUMIN 2.3*  AST 47*  ALT 27  ALKPHOS 76  BILITOT 0.8   PT/INR Recent Labs    10/30/22 0358  LABPROT 16.7*  INR 1.4*     Scheduled inpatient medications:   calcium-vitamin D  1 tablet Oral Q1200   cyanocobalamin  1,000 mcg Oral q AM   feeding supplement  237 mL Oral TID   folic acid  1 mg Oral Daily   lactulose  10 g Oral BID   latanoprost  1 drop Both Eyes QHS   magnesium oxide  400 mg Oral TID   melatonin  5 mg Oral QHS   metoprolol tartrate   12.5 mg Oral BID   mometasone-formoterol  2 puff Inhalation BID   multivitamin with minerals  1 tablet Oral Daily   pantoprazole  40 mg Oral BID   tamsulosin  0.4 mg Oral q AM   umeclidinium bromide  1 puff Inhalation q AM   Continuous inpatient infusions:   ciprofloxacin 400 mg (10/31/22 1118)   dextrose     PRN inpatient medications: acetaminophen, Gerhardt's butt cream, ipratropium-albuterol  Vital signs in last 24 hours: Temp:  [97.3 F (36.3 C)-98.6 F (37 C)] 97.3 F (36.3 C) (02/28 0540) Pulse Rate:  [68-93] 68 (02/28 0540) Resp:  [11-17] 11 (02/28 0540) BP: (96-122)/(56-90) 115/61 (02/28 0540) SpO2:  [96 %-100 %] 96 % (02/28 0500) Last BM Date : 10/30/22  Intake/Output Summary (Last 24 hours) at 10/31/2022 1150 Last data filed at 10/31/2022 0500 Gross per 24 hour  Intake 2081.57 ml  Output 950 ml  Net 1131.57 ml    Intake/Output from previous day: 02/27 0701 - 02/28 0700 In: 2081.6 [P.O.:480; I.V.:1196.2; IV Piggyback:405.3] Out: 950 [Urine:950] Intake/Output this shift: No intake/output data recorded.   Physical Exam:  General: Alert male in NAD Heart:  Regular rate and rhythm.  Pulmonary: Normal respiratory effort Abdomen: Soft, mod to largely distended, nontender. Normal bowel  Neurologic: Alert, mild asterixis Psych: . Cooperative, tries to follow commands     Principal Problem:   Bright red blood per rectum Active Problems:   Anemia, macrocytic   Thrombocytopenia (HCC)   Dysphagia   Hypomagnesemia   COPD (chronic obstructive pulmonary disease) (HCC)   Hypernatremia   HTN (hypertension)   COPD (chronic obstructive pulmonary disease) (HCC)   Cirrhosis (HCC)   Stage 3b chronic kidney disease (CKD) ([place])   Gastrointestinal hemorrhage     LOS: 2 days   Tye Savoy ,NP 10/31/2022, 11:50 AM

## 2022-10-31 NOTE — Plan of Care (Signed)

## 2022-10-31 NOTE — Progress Notes (Addendum)
PROGRESS NOTE        PATIENT DETAILS Name: Jacob Bass Age: 62 y.o. Sex: male Date of Birth: 02-28-1961 Admit Date: 10/28/2022 Admitting Physician Richarda Osmond, MD [id number], Rich Reining., MD  Brief Summary: Patient is a 62 y.o.  male with history of liver cirrhosis, CVA, dementia who presented from a local SNF for lower GI bleeding.  Significant events: 1/8-2/23>> hospitalization for GI bleed, blood loss anemia, AKI, CVA 2/25>> admit to [facility] for lower GI bleeding  Significant studies: 2/20>> CT angio abdomen/pelvis: Poor study-but no active bleeding seen. 2/25>> CT angio abdomen/pelvis: No evidence of GI bleeding.  Significant microbiology data: None  Procedures: 2/25>> paracentesis  Consults: GI  Subjective: Confused-pushes me away.  Speech is clear.  Moving all 4 extremities.  Hematochezia this morning  Objective: Vitals: Blood pressure 115/61, pulse 68, temperature (!) 97.3 F (36.3 C), temperature source Axillary, resp. rate 11, height '5\' 7"'$  (1.702 m), weight 58.5 kg, SpO2 96 %.   Exam: Gen Exam:not in any distress HEENT:atraumatic, normocephalic Chest: B/L clear to auscultation anteriorly CVS:S1S2 regular Abdomen:soft non tender, non distended Extremities:no edema Neurology: Difficult exam but appears nonfocal. Skin: no rash  Pertinent Labs/Radiology:    Latest Ref Rng & Units 10/31/2022    2:58 AM 10/30/2022    3:58 AM 10/29/2022    3:47 AM  CBC  WBC 4.0 - 10.5 K/uL 5.8  7.9  6.1   Hemoglobin 13.0 - 17.0 g/dL 7.0  9.0  8.3   Hematocrit 39.0 - 52.0 % 21.6  28.2  25.2   Platelets 150 - 400 K/uL 52  70  67     Lab Results  Component Value Date   NA 150 (H) 10/31/2022   K 3.5 10/31/2022   CL 118 (H) 10/31/2022   CO2 24 10/31/2022      Assessment/Plan: Lower GI bleeding with acute blood loss anemia Hematochezia this morning, hemoglobin down to 7.0 Spoke with DSS-Nicole Draper ([phone])-regarding PRBC  transfusion-risk/benefits discussed-she needs to call other person before she can consent. Very difficult situation-this is likely a diverticular bleeding-given dementia/frailty-do not think he will be able to drink colonoscopy prep.  Suspect it is best to transfuse, and provide supportive care and hope diverticular bleeding resolves on its own.  If recurrent hematochezia reoccurs-best to see if we can localize it with CTA/bleeding study and pursue embolization.  If he does require endoscopic evaluation-will need to place NG tube-in restraints to administer GoLytely.   Discussed with GI team-follow CBC-nursing staff aware to let us know if he rebleeds so that we can get a bleeding study/CTA ordered ASAP.  Addendum 2 bloody BMs this morning-after discussion with gastroenterology-CTA ordered which did not show any active extravasation.  I managed to speak with Ms. Draper at [facility] did give consent for PRBC transfusion-plan is to transfuse 2 units today.  Alcoholic liver cirrhosis HCV History of SBP 07/23/2022 Known portal gastropathy S/p paracentesis yesterday-belly is soft and not tense or extensively distended. On ciprofloxacin for SBP prophylaxis.  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  Hypernatremia Due to poor oral intake Starting D5W infusion  AKI on CKD stage IIIb Hemodynamically mediated Creatinine still not at baseline Starting IVF  History of recent acute  CVA February 2024 No new deficits Not a candidate for ASA given active bleeding Not a candidate for statin per chart review due to underlying liver cirrhosis.  COPD Not in exacerbation Continue bronchodilators  HTN BP soft Hold metoprolol Lisinopril was discontinued last admit  BPH Flomax  Dementia At risk for  delirium Confused Maintain delirium precautions  Dysphagia Continue dysphagia 1 diet Per review of prior notes during his most recent hospitalization-not a candidate for PEG tube placement given recurrent ascites  Palliative care Full code for now Spoke briefly with DSS social worker-Nicole Draper-he is not a candidate for aggressive care-if he deteriorates-May need to get palliative care involved again.  BMI: Estimated body mass index is 20.2 kg/m as calculated from the following:   Height as of this encounter: '5\' 7"'$  (1.702 m).   Weight as of this encounter: 58.5 kg.   Code status:   Code Status: Full Code   DVT Prophylaxis: SCDs Start: 10/28/22 2018    Family Communication: SS-Nicole Draper ([phone])-updated over the phone 2/28   Disposition Plan: Status is: Inpatient Remains inpatient appropriate because: Severity of illness   Planned Discharge Destination:Skilled nursing facility   Diet: Diet Order             DIET - DYS 1 Room service appropriate? No; Fluid consistency: Thin  Diet effective now                     Antimicrobial agents: Anti-infectives (From admission, onward)    Start     Dose/Rate Route Frequency Ordered Stop   10/29/22 1900  ciprofloxacin (CIPRO) IVPB 400 mg        400 mg 200 mL/hr over 60 Minutes Intravenous Every 12 hours 10/29/22 1804          MEDICATIONS: Scheduled Meds:  calcium-vitamin D  1 tablet Oral Q1200   cyanocobalamin  1,000 mcg Oral q AM   feeding supplement  237 mL Oral TID   folic acid  1 mg Oral Daily   lactulose  10 g Oral BID   latanoprost  1 drop Both Eyes QHS   magnesium oxide  400 mg Oral TID   melatonin  5 mg Oral QHS   metoprolol tartrate  12.5 mg Oral BID   mometasone-formoterol  2 puff Inhalation BID   multivitamin with minerals  1 tablet Oral Daily   pantoprazole  40 mg Oral BID   tamsulosin  0.4 mg Oral q AM   umeclidinium bromide  1 puff Inhalation q AM   Continuous Infusions:   ciprofloxacin 400 mg (10/31/22 1118)   PRN Meds:.acetaminophen, Gerhardt's butt cream, ipratropium-albuterol   I have personally reviewed following labs and imaging studies  LABORATORY DATA: CBC: Recent Labs  Lab 10/26/22 0745 10/28/22 1808 10/28/22 1835 10/29/22 0347 10/30/22 0358 10/31/22 0258  WBC 8.2 6.6  --  6.1 7.9 5.8  NEUTROABS  --  3.9  --   --   --   --   HGB 8.5* 9.1* 8.5* 8.3* 9.0* 7.0*  HCT 26.7* 28.2* 25.0* 25.2* 28.2* 21.6*  MCV 106.0* 107.2*  --  106.8* 107.2* 108.0*  PLT 64* 74*  --  67* 70* 52*    Basic Metabolic Panel: Recent Labs  Lab 10/26/22 0745 10/28/22 1808 10/28/22 1835 10/29/22 0347 10/30/22 0358 10/31/22 0258  NA 143 146* 151* 147* 149* 150*  K 3.8 4.4 4.1 3.8 3.7 3.5  CL 114* 117* 116* 116* 116* 118*  CO2 23 22  --  $'23 23 24  'i$ GLUCOSE 71 101* 92 111* 112* 94  BUN 33* 32* 36* 31* 31* 28*  CREATININE 1.50* 1.91* 2.00* 1.80* 1.85* 1.74*  CALCIUM 7.8* 7.7*  --  7.6* 8.0* 8.0*  MG  --   --   --  1.9  --   --     GFR: Estimated Creatinine Clearance: 36 mL/min (A) (by C-G formula based on SCr of 1.74 mg/dL (H)).  Liver Function Tests: Recent Labs  Lab 10/28/22 1808 10/29/22 0347 10/29/22 1809 10/30/22 0358 10/31/22 0258  AST 82* 69*  --  67* 47*  ALT 41 40  --  39 27  ALKPHOS 116 103  --  110 76  BILITOT 1.0 0.8  --  1.0 0.8  PROT 6.2* 5.9* 6.2* 6.1* 5.5*  ALBUMIN 1.7* 1.6*  --  1.5* 2.3*   No results for input(s): "LIPASE", "AMYLASE" in the last 168 hours. Recent Labs  Lab 10/29/22 0347  AMMONIA 73*    Coagulation Profile: Recent Labs  Lab 10/30/22 0358  INR 1.4*    Cardiac Enzymes: No results for input(s): "CKTOTAL", "CKMB", "CKMBINDEX", "TROPONINI" in the last 168 hours.  BNP (last 3 results) No results for input(s): "PROBNP" in the last 8760 hours.  Lipid Profile: No results for input(s): "CHOL", "HDL", "LDLCALC", "TRIG", "CHOLHDL", "LDLDIRECT" in the last 72 hours.  Thyroid Function Tests: No results for  input(s): "TSH", "T4TOTAL", "FREET4", "T3FREE", "THYROIDAB" in the last 72 hours.  Anemia Panel: No results for input(s): "VITAMINB12", "FOLATE", "FERRITIN", "TIBC", "IRON", "RETICCTPCT" in the last 72 hours.  Urine analysis:    Component Value Date/Time   COLORURINE AMBER (A) 10/02/2022 0521   APPEARANCEUR HAZY (A) 10/02/2022 0521   LABSPEC 1.015 10/02/2022 0521   PHURINE 5.0 10/02/2022 0521   GLUCOSEU NEGATIVE 10/02/2022 0521   HGBUR NEGATIVE 10/02/2022 0521   BILIRUBINUR NEGATIVE 10/02/2022 0521   KETONESUR NEGATIVE 10/02/2022 0521   PROTEINUR NEGATIVE 10/02/2022 0521   NITRITE NEGATIVE 10/02/2022 0521   LEUKOCYTESUR NEGATIVE 10/02/2022 0521    Sepsis Labs: Lactic Acid, Venous    Component Value Date/Time   LATICACIDVEN 3.5 (HH) 09/10/2022 0957    MICROBIOLOGY: No results found for this or any previous visit (from the past 240 hour(s)).  RADIOLOGY STUDIES/RESULTS: IR Paracentesis  Result Date: 10/30/2022 INDICATION: Recurrent ascites secondary to cirrhosis. Request for therapeutic paracentesis. EXAM: ULTRASOUND GUIDED PARACENTESIS MEDICATIONS: 1% lidocaine 10 mL COMPLICATIONS: None immediate. PROCEDURE: Informed written consent was obtained from the patient after a discussion of the risks, benefits and alternatives to treatment. A timeout was performed prior to the initiation of the procedure. Initial ultrasound scanning demonstrates a small amount of ascites within the right lower abdominal quadrant. The right lower abdomen was prepped and draped in the usual sterile fashion. 1% lidocaine was used for local anesthesia. Following this, a 19 gauge, 7-cm, Yueh catheter was introduced. An ultrasound image was saved for documentation purposes. The paracentesis was performed. The catheter was removed and a dressing was applied. The patient tolerated the procedure well without immediate post procedural complication. FINDINGS: A total of approximately 1.5 L of clear yellow fluid was  removed. IMPRESSION: Successful ultrasound-guided paracentesis yielding 1.5 liters of peritoneal fluid. Procedure performed by: Gareth Eagle, PA-C Electronically Signed   By: Albin Felling M.D.   On: 10/30/2022 16:43     LOS: 2 days   Oren Binet, MD  [facility]    To contact the attending provider between 7A-7P or the covering provider during after hours  7P-7A, please log into the web site [email or web address] and access using universal [place] password for that web site. If you do not have the password, please call the hospital operator.  10/31/2022, 11:31 AM

## 2022-10-31 NOTE — TOC Progression Note (Signed)
Transition of Care [facility]) - Progression Note    Patient Details  Name: Jacob Bass MRN: [id number] Date of Birth: 02-17-1961  Transition of Care [facility]) CM/SW [place], LCSW Phone Number: 10/31/2022, 4:37 PM  Clinical Narrative:    9:21am-CSW left voicemail for Department of Social Services [place], [phone]).  10:48am-CSW spoke with Lavella Hammock and provided update as requested.  4:38pm-CSW received contact info from MD that he received a call from [place] [phone]) stating decisions should go through her. CSW left her a voicemail requesting a copy of Guardianship paperwork be faxed to [place] if that has been solidified.    Expected Discharge Plan: [place] Barriers to Discharge: Continued Medical Work up  Expected Discharge Plan and Services In-house Referral: Clinical Social Work   Post Acute Care Choice: New Site Living arrangements for the past 2 months: [place]                                       Social Determinants of Health (SDOH) Interventions SDOH Screenings   Food Insecurity: Unknown (10/29/2022)  Transportation Needs: Unknown (10/29/2022)  Utilities: Unknown (10/29/2022)  Tobacco Use: High Risk (10/30/2022)    Readmission Risk Interventions    10/30/2022    9:16 AM  Readmission Risk Prevention Plan  Transportation Screening Complete  Medication Review (RN Care Manager) Complete  PCP or Specialist appointment within 3-5 days of discharge Complete  HRI or Home Care Consult Complete  SW Recovery Care/Counseling Consult Complete  Palliative Care Screening Not [place] Complete

## 2022-11-01 DIAGNOSIS — K625 Hemorrhage of anus and rectum: Secondary | ICD-10-CM | POA: Diagnosis not present

## 2022-11-01 DIAGNOSIS — R1312 Dysphagia, oropharyngeal phase: Secondary | ICD-10-CM | POA: Diagnosis not present

## 2022-11-01 DIAGNOSIS — K922 Gastrointestinal hemorrhage, unspecified: Secondary | ICD-10-CM | POA: Diagnosis not present

## 2022-11-01 DIAGNOSIS — K7031 Alcoholic cirrhosis of liver with ascites: Secondary | ICD-10-CM | POA: Diagnosis not present

## 2022-11-01 DIAGNOSIS — N1832 Chronic kidney disease, stage 3b: Secondary | ICD-10-CM | POA: Diagnosis not present

## 2022-11-01 LAB — TYPE AND SCREEN
ABO/RH(D): O POS
Antibody Screen: NEGATIVE
Unit division: 0
Unit division: 0

## 2022-11-01 LAB — CBC
HCT: 32.3 % — ABNORMAL LOW (ref 39.0–52.0)
Hemoglobin: 10.8 g/dL — ABNORMAL LOW (ref 13.0–17.0)
MCH: 32.8 pg (ref 26.0–34.0)
MCHC: 33.4 g/dL (ref 30.0–36.0)
MCV: 98.2 fL (ref 80.0–100.0)
Platelets: 56 10*3/uL — ABNORMAL LOW (ref 150–400)
RBC: 3.29 MIL/uL — ABNORMAL LOW (ref 4.22–5.81)
RDW: 21.1 % — ABNORMAL HIGH (ref 11.5–15.5)
WBC: 6.4 10*3/uL (ref 4.0–10.5)
nRBC: 0 % (ref 0.0–0.2)

## 2022-11-01 LAB — BPAM RBC
Blood Product Expiration Date: 202403282359
Blood Product Expiration Date: 202403282359
ISSUE DATE / TIME: 202402281435
ISSUE DATE / TIME: 202402281809
Unit Type and Rh: 5100
Unit Type and Rh: 5100

## 2022-11-01 LAB — BASIC METABOLIC PANEL
Anion gap: 9 (ref 5–15)
BUN: 25 mg/dL — ABNORMAL HIGH (ref 8–23)
CO2: 23 mmol/L (ref 22–32)
Calcium: 7.9 mg/dL — ABNORMAL LOW (ref 8.9–10.3)
Chloride: 113 mmol/L — ABNORMAL HIGH (ref 98–111)
Creatinine, Ser: 1.49 mg/dL — ABNORMAL HIGH (ref 0.61–1.24)
GFR, Estimated: 52 mL/min — ABNORMAL LOW (ref >60)
Glucose, Bld: 108 mg/dL — ABNORMAL HIGH (ref 70–99)
Potassium: 3.4 mmol/L — ABNORMAL LOW (ref 3.5–5.1)
Sodium: 145 mmol/L (ref 135–145)

## 2022-11-01 MED ORDER — ALBUMIN HUMAN 25 % IV SOLN
50.0000 g | Freq: Once | INTRAVENOUS | Status: AC
  Administered 2022-11-01: 50 g via INTRAVENOUS
  Filled 2022-11-01: qty 200

## 2022-11-01 MED ORDER — POTASSIUM CHLORIDE 20 MEQ PO PACK
40.0000 meq | PACK | Freq: Once | ORAL | Status: AC
  Administered 2022-11-01: 40 meq via ORAL
  Filled 2022-11-01: qty 2

## 2022-11-01 NOTE — Progress Notes (Signed)
Pt is alert and oriented. Pt refused labs. Pt stated he do not need any labs. Writer explain to the pt about the purpose of [place] getting his labs. Pt refused times 3 and will not put his arm down, noted. Call bell in reach.

## 2022-11-01 NOTE — Plan of Care (Signed)

## 2022-11-01 NOTE — Progress Notes (Signed)
Daily Progress Note  DOA: 10/28/2022 Hospital Day: 5  Chief Complaint: decompensated cirrhosis, rectal bleeding  ASSESSMENT  # 62 yo male, nursing home resident with decompensated cirrhosis.   # Hematochezia, anemia. Also hematochezia during hospitalization in January.  Patient has several limitations to getting further endoscopic evaluation, including lack of a guardian and likely lack of ability to be able to tolerate a bowel prep. Repeat CTA yesterday was negative for GI bleeding source. Hgb responded extremely well to PRBC transfusion, suggesting that perhaps his drop in hemoglobin was due to natural fluctuations in his labs. Currently I do not think that the patient is having significant GI bleeding so there is no urgency in pursuing any endoscopic evaluation.  #Ascites. Last para with 1.5 liter removed 2/27  # AKI on CKD stage 3b - Patient's creatinine appears to be improving on albumin therapy  # Recent CVA with neurogenic dysphagia   # Dementia  PLAN:  - Trend hemoglobin - Restarted on dysphagia 1 diet low sodium <2 grams per day - Will give patient another dose of albumin today to see if this further helps with his kidney function - Continue lactulose 10 grams BID - Continue BID PPI - Continue cipro for SBP prophylaxis due to history of SBP  SUBJECTIVE: Patient has not had any additional rectal bleeding.   Imaging:  CT ANGIO GI BLEED  Result Date: 10/31/2022 CLINICAL DATA:  Lower GI bleeding, rectal bleeding. EXAM: CTA ABDOMEN AND PELVIS WITHOUT AND WITH CONTRAST TECHNIQUE: Multidetector CT imaging of the abdomen and pelvis was performed using the standard protocol during bolus administration of intravenous contrast. Multiplanar reconstructed images and MIPs were obtained and reviewed to evaluate the vascular anatomy. RADIATION DOSE REDUCTION: This exam was performed according to the departmental dose-optimization program which includes automated exposure control,  adjustment of the mA and/or kV according to patient size and/or use of iterative reconstruction technique. CONTRAST:  24m OMNIPAQUE IOHEXOL 350 MG/ML SOLN COMPARISON:  CTA abdomen and pelvis 10/28/2022 FINDINGS: VASCULAR Aorta: Normal caliber aorta without aneurysm, dissection, vasculitis or significant stenosis. There are moderate atherosclerotic calcifications throughout the aorta, unchanged. Celiac: Patent without evidence of aneurysm, dissection, vasculitis or significant stenosis. SMA: Patent without evidence of aneurysm, dissection, vasculitis or significant stenosis. Renals: Both renal arteries are patent without evidence of aneurysm, dissection, vasculitis, fibromuscular dysplasia or significant stenosis. IMA: Patent without evidence of aneurysm, dissection, vasculitis or significant stenosis. Inflow: Patent without evidence of aneurysm, dissection, vasculitis or significant stenosis. Calcified atherosclerotic disease is present. Proximal Outflow: Calcified and noncalcified atherosclerotic disease again seen. There is stable mild-to-moderate narrowing of the bilateral superficial femoral arteries. Common femoral arteries are patent. No dissection or aneurysm. Veins: No obvious venous abnormality within the limitations of this arterial phase study. Review of the MIP images confirms the above findings. NON-VASCULAR Lower chest: There is a small left pleural effusion with compressive atelectasis of the left lower lobe, unchanged. Hepatobiliary: Again seen is nodular liver contour compatible with cirrhosis. Gallbladder and bile ducts are within normal limits. Pancreas: Unremarkable. No pancreatic ductal dilatation or surrounding inflammatory changes. Spleen: Normal in size without focal abnormality. Adrenals/Urinary Tract: Adrenal glands are unremarkable. Kidneys are normal, without renal calculi, focal lesion, or hydronephrosis. Bladder is unremarkable. Stomach/Bowel: There is no evidence for active arterial  extravasation. There is no bowel obstruction, pneumatosis or free air. There is diffuse colonic wall thickening most significant in the rectosigmoid colon which is similar to prior study. The stomach is decompressed and gastric wall  thickening is not excluded, also unchanged. Appendix is within normal limits. Lymphatic: No enlarged lymph nodes are seen. Reproductive: Prostate is unremarkable. Other: Large volume ascites is unchanged. Diffuse body wall edema is unchanged. There is a small umbilical hernia containing ascites. Musculoskeletal: No acute fractures are seen. Degenerative changes affect both hips, right greater than left. IMPRESSION: 1. No evidence for active arterial extravasation. 2. No evidence for aortic dissection or aneurysm. Aortic Atherosclerosis (ICD10-I70.0). NON-VASCULAR 1. No significant interval change in diffuse colonic wall thickening most significant in the rectosigmoid colon. Findings may be related to colitis or portal colopathy. 2. Stable large volume ascites and body wall edema. 3. Stable small left pleural effusion with compressive atelectasis of the left lower lobe. 4. Stable cirrhotic morphology of the liver. Electronically Signed   By: Ronney Asters M.D.   On: 10/31/2022 18:13   IR Paracentesis  Result Date: 10/30/2022 INDICATION: Recurrent ascites secondary to cirrhosis. Request for therapeutic paracentesis. EXAM: ULTRASOUND GUIDED PARACENTESIS MEDICATIONS: 1% lidocaine 10 mL COMPLICATIONS: None immediate. PROCEDURE: Informed written consent was obtained from the patient after a discussion of the risks, benefits and alternatives to treatment. A timeout was performed prior to the initiation of the procedure. Initial ultrasound scanning demonstrates a small amount of ascites within the right lower abdominal quadrant. The right lower abdomen was prepped and draped in the usual sterile fashion. 1% lidocaine was used for local anesthesia. Following this, a 19 gauge, 7-cm, Yueh  catheter was introduced. An ultrasound image was saved for documentation purposes. The paracentesis was performed. The catheter was removed and a dressing was applied. The patient tolerated the procedure well without immediate post procedural complication. FINDINGS: A total of approximately 1.5 L of clear yellow fluid was removed. IMPRESSION: Successful ultrasound-guided paracentesis yielding 1.5 liters of peritoneal fluid. Procedure performed by: Gareth Eagle, PA-C Electronically Signed   By: Albin Felling M.D.   On: 10/30/2022 16:43     Lab Results: Recent Labs    10/31/22 0258 10/31/22 1959 11/01/22 0651  WBC 5.8 5.1 6.4  HGB 7.0* 10.5* 10.8*  HCT 21.6* 31.1* 32.3*  PLT 52* 51* 56*   BMET Recent Labs    10/30/22 0358 10/31/22 0258 11/01/22 0651  NA 149* 150* 145  K 3.7 3.5 3.4*  CL 116* 118* 113*  CO2 '23 24 23  '$ GLUCOSE 112* 94 108*  BUN 31* 28* 25*  CREATININE 1.85* 1.74* 1.49*  CALCIUM 8.0* 8.0* 7.9*   LFT Recent Labs    10/31/22 0258  PROT 5.5*  ALBUMIN 2.3*  AST 47*  ALT 27  ALKPHOS 76  BILITOT 0.8   PT/INR Recent Labs    10/30/22 0358  LABPROT 16.7*  INR 1.4*     Scheduled inpatient medications:   calcium-vitamin D  1 tablet Oral Q1200   cyanocobalamin  1,000 mcg Oral q AM   feeding supplement  237 mL Oral TID   folic acid  1 mg Oral Daily   lactulose  10 g Oral BID   latanoprost  1 drop Both Eyes QHS   magnesium oxide  400 mg Oral TID   melatonin  5 mg Oral QHS   mometasone-formoterol  2 puff Inhalation BID   multivitamin with minerals  1 tablet Oral Daily   pantoprazole  40 mg Oral BID   tamsulosin  0.4 mg Oral q AM   umeclidinium bromide  1 puff Inhalation q AM   Continuous inpatient infusions:   albumin human     ciprofloxacin 400  mg (11/01/22 0928)   dextrose 75 mL/hr at 11/01/22 0924   PRN inpatient medications: acetaminophen, Gerhardt's butt cream, ipratropium-albuterol  Vital signs in last 24 hours: Temp:  [97.3 F (36.3 C)-98.4  F (36.9 C)] 97.8 F (36.6 C) (02/29 0800) Pulse Rate:  [69-88] 77 (02/29 1345) Resp:  [13-19] 18 (02/29 1345) BP: (109-141)/(56-84) 109/60 (02/29 1345) SpO2:  [99 %-100 %] 100 % (02/29 1222) Last BM Date : 11/01/22  Intake/Output Summary (Last 24 hours) at 11/01/2022 1504 Last data filed at 11/01/2022 0526 Gross per 24 hour  Intake 729.58 ml  Output 500 ml  Net 229.58 ml    Intake/Output from previous day: 02/28 0701 - 02/29 0700 In: 729.6 [Blood:729.6] Out: 500 [Urine:500] Intake/Output this shift: No intake/output data recorded.   Physical Exam:  General: Alert male in NAD Heart:  Regular rate Pulmonary: Normal respiratory effort Abdomen: Soft, mod to largely distended, nontender Neurologic: Alert Psych: . Cooperative, tries to follow commands     Principal Problem:   Bright red blood per rectum Active Problems:   Anemia, macrocytic   Thrombocytopenia (HCC)   Dysphagia   Hypomagnesemia   COPD (chronic obstructive pulmonary disease) (HCC)   Hypernatremia   HTN (hypertension)   COPD (chronic obstructive pulmonary disease) (HCC)   Cirrhosis (HCC)   Stage 3b chronic kidney disease (CKD) ([place])   Gastrointestinal hemorrhage     LOS: 3 days   Sharyn Creamer ,MD 11/01/2022, 3:04 PM

## 2022-11-01 NOTE — Progress Notes (Signed)
PROGRESS NOTE        PATIENT DETAILS Name: Jacob Bass Age: 62 y.o. Sex: male Date of Birth: [birth year] Admit Date: 10/28/2022 Admitting Physician Richarda Osmond, MD AN:[id number], Rich Reining., MD  Brief Summary: Patient is a 62 y.o.  male with history of liver cirrhosis, CVA, dementia who presented from a local SNF for lower GI bleeding.  Significant events: 1/8-2/23>> hospitalization for GI bleed, blood loss anemia, AKI, CVA 2/25>> admit to [facility] for lower GI bleeding 2/28>> hematochezia x 2-repeat CT angio-no extravasation.  2 units PRBC transfused.  Significant studies: 2/20>> CT angio abdomen/pelvis: Poor study-but no active bleeding seen. 2/25>> CT angio abdomen/pelvis: No evidence of GI bleeding. 2/28>> CT angio abdomen/pelvis: No active GI bleeding.  Significant microbiology data: None  Procedures: 2/25>> paracentesis  Consults: GI  Subjective: Pleasantly confused-per nursing staff-he did not have any hematochezia overnight-in fact had a known non BM overnight.  Objective: Vitals: Blood pressure 114/83, pulse 72, temperature 97.8 F (36.6 C), temperature source Oral, resp. rate 13, height '5\' 7"'$  (1.702 m), weight 58.5 kg, SpO2 99 %.   Exam: Gen Exam:Alert awake-not in any distress HEENT:atraumatic, normocephalic Chest: B/L clear to auscultation anteriorly CVS:S1S2 regular Abdomen:soft non tender, non distended Extremities:no edema Neurology: Non focal Skin: no rash  Pertinent Labs/Radiology:    Latest Ref Rng & Units 11/01/2022    6:51 AM 10/31/2022    7:59 PM 10/31/2022    2:58 AM  CBC  WBC 4.0 - 10.5 K/uL 6.4  5.1  5.8   Hemoglobin 13.0 - 17.0 g/dL 10.8  10.5  7.0   Hematocrit 39.0 - 52.0 % 32.3  31.1  21.6   Platelets 150 - 400 K/uL 56  51  52     Lab Results  Component Value Date   NA 145 11/01/2022   K 3.4 (L) 11/01/2022   CL 113 (H) 11/01/2022   CO2 23 11/01/2022      Assessment/Plan: Lower GI bleeding with acute  blood loss anemia Suspected diverticular etiology CTA GI bleed study negative for active bleeding on 2/28 Hemoglobin now stable after 2 units of PRBC transfusion Per nursing staff-none bloody BM overnight-no further hematochezia since 2/28 Plan is to follow CBC-and clinical course Not a ideal candidate to pursue endoscopic evaluation-given dementia-he will likely not be able to cooperate with bowel prep. Hopefully if he does not bleed for another 24 hours-we could forego any further workup.  Alcoholic liver cirrhosis HCV History of SBP 07/23/2022 Known portal gastropathy S/p paracentesis yesterday-belly is soft and not tense or extensively distended. On ciprofloxacin for SBP prophylaxis.  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  Hypernatremia Due to poor oral intake Improved with D5W infusion Encourage oral intake  AKI on CKD stage IIIb Hemodynamically mediated Improving with supportive care/IVF  Hypokalemia Replete/recheck  History of recent acute CVA February 2024 No new deficits Not a candidate for ASA given active bleeding Not a candidate for statin per chart review due to underlying liver cirrhosis.  COPD Not in exacerbation Continue bronchodilators  HTN BP soft Hold metoprolol Lisinopril was discontinued last admit  BPH Flomax  Dementia At risk for delirium Confused Maintain delirium precautions  Dysphagia Continue dysphagia 1 diet Per review of  prior notes during his most recent hospitalization-not a candidate for PEG tube placement given recurrent ascites  Palliative care Full code for now Spoke briefly with DSS social worker-Nicole Draper-he is not a candidate for aggressive care-if he deteriorates-May need to get palliative care involved  again.  BMI: Estimated body mass index is 20.2 kg/m as calculated from the following:   Height as of this encounter: '5\' 7"'$  (1.702 m).   Weight as of this encounter: 58.5 kg.   Code status:   Code Status: Full Code   DVT Prophylaxis: SCDs Start: 10/28/22 2018    Family Communication: SS-Nicole Draper ([phone])-updated over the phone 2/28   Disposition Plan: Status is: Inpatient Remains inpatient appropriate because: Severity of illness   Planned Discharge Destination:Skilled nursing facility   Diet: Diet Order             Diet clear liquid Room service appropriate? Yes; Fluid consistency: Thin  Diet effective now                     Antimicrobial agents: Anti-infectives (From admission, onward)    Start     Dose/Rate Route Frequency Ordered Stop   10/29/22 1900  ciprofloxacin (CIPRO) IVPB 400 mg        400 mg 200 mL/hr over 60 Minutes Intravenous Every 12 hours 10/29/22 1804          MEDICATIONS: Scheduled Meds:  calcium-vitamin D  1 tablet Oral Q1200   cyanocobalamin  1,000 mcg Oral q AM   feeding supplement  237 mL Oral TID   folic acid  1 mg Oral Daily   lactulose  10 g Oral BID   latanoprost  1 drop Both Eyes QHS   magnesium oxide  400 mg Oral TID   melatonin  5 mg Oral QHS   mometasone-formoterol  2 puff Inhalation BID   multivitamin with minerals  1 tablet Oral Daily   pantoprazole  40 mg Oral BID   tamsulosin  0.4 mg Oral q AM   umeclidinium bromide  1 puff Inhalation q AM   Continuous Infusions:  ciprofloxacin 400 mg (11/01/22 0928)   dextrose 75 mL/hr at 11/01/22 0924   PRN Meds:.acetaminophen, Gerhardt's butt cream, ipratropium-albuterol   I have personally reviewed following labs and imaging studies  LABORATORY DATA: CBC: Recent Labs  Lab 10/28/22 1808 10/28/22 1835 10/29/22 0347 10/30/22 0358 10/31/22 0258 10/31/22 1959 11/01/22 0651  WBC 6.6  --  6.1 7.9 5.8 5.1 6.4  NEUTROABS 3.9  --   --   --   --   --   --    HGB 9.1*   < > 8.3* 9.0* 7.0* 10.5* 10.8*  HCT 28.2*   < > 25.2* 28.2* 21.6* 31.1* 32.3*  MCV 107.2*  --  106.8* 107.2* 108.0* 98.7 98.2  PLT 74*  --  67* 70* 52* 51* 56*   < > = values in this interval not displayed.     Basic Metabolic Panel: Recent Labs  Lab 10/28/22 1808 10/28/22 1835 10/29/22 0347 10/30/22 0358 10/31/22 0258 11/01/22 0651  NA 146* 151* 147* 149* 150* 145  K 4.4 4.1 3.8 3.7 3.5 3.4*  CL 117* 116* 116* 116* 118* 113*  CO2 22  --  '23 23 24 23  '$ GLUCOSE 101* 92 111* 112* 94 108*  BUN 32* 36* 31* 31* 28* 25*  CREATININE 1.91* 2.00* 1.80* 1.85* 1.74* 1.49*  CALCIUM 7.7*  --  7.6* 8.0* 8.0* 7.9*  MG  --   --  1.9  --   --   --      GFR: Estimated Creatinine Clearance: 42 mL/min (A) (by C-G formula based on SCr of 1.49 mg/dL (H)).  Liver Function Tests: Recent Labs  Lab 10/28/22 1808 10/29/22 0347 10/29/22 1809 10/30/22 0358 10/31/22 0258  AST 82* 69*  --  67* 47*  ALT 41 40  --  39 27  ALKPHOS 116 103  --  110 76  BILITOT 1.0 0.8  --  1.0 0.8  PROT 6.2* 5.9* 6.2* 6.1* 5.5*  ALBUMIN 1.7* 1.6*  --  1.5* 2.3*    No results for input(s): "LIPASE", "AMYLASE" in the last 168 hours. Recent Labs  Lab 10/29/22 0347  AMMONIA 73*     Coagulation Profile: Recent Labs  Lab 10/30/22 0358  INR 1.4*     Cardiac Enzymes: No results for input(s): "CKTOTAL", "CKMB", "CKMBINDEX", "TROPONINI" in the last 168 hours.  BNP (last 3 results) No results for input(s): "PROBNP" in the last 8760 hours.  Lipid Profile: No results for input(s): "CHOL", "HDL", "LDLCALC", "TRIG", "CHOLHDL", "LDLDIRECT" in the last 72 hours.  Thyroid Function Tests: No results for input(s): "TSH", "T4TOTAL", "FREET4", "T3FREE", "THYROIDAB" in the last 72 hours.  Anemia Panel: No results for input(s): "VITAMINB12", "FOLATE", "FERRITIN", "TIBC", "IRON", "RETICCTPCT" in the last 72 hours.  Urine analysis:    Component Value Date/Time   COLORURINE AMBER (A) 10/02/2022 0521    APPEARANCEUR HAZY (A) 10/02/2022 0521   LABSPEC 1.015 10/02/2022 0521   PHURINE 5.0 10/02/2022 0521   GLUCOSEU NEGATIVE 10/02/2022 0521   HGBUR NEGATIVE 10/02/2022 0521   BILIRUBINUR NEGATIVE 10/02/2022 0521   KETONESUR NEGATIVE 10/02/2022 0521   PROTEINUR NEGATIVE 10/02/2022 0521   NITRITE NEGATIVE 10/02/2022 0521   LEUKOCYTESUR NEGATIVE 10/02/2022 0521    Sepsis Labs: Lactic Acid, Venous    Component Value Date/Time   LATICACIDVEN 3.5 (HH) 09/10/2022 0957    MICROBIOLOGY: No results found for this or any previous visit (from the past 240 hour(s)).  RADIOLOGY STUDIES/RESULTS: CT ANGIO GI BLEED  Result Date: 10/31/2022 CLINICAL DATA:  Lower GI bleeding, rectal bleeding. EXAM: CTA ABDOMEN AND PELVIS WITHOUT AND WITH CONTRAST TECHNIQUE: Multidetector CT imaging of the abdomen and pelvis was performed using the standard protocol during bolus administration of intravenous contrast. Multiplanar reconstructed images and MIPs were obtained and reviewed to evaluate the vascular anatomy. RADIATION DOSE REDUCTION: This exam was performed according to the departmental dose-optimization program which includes automated exposure control, adjustment of the mA and/or kV according to patient size and/or use of iterative reconstruction technique. CONTRAST:  88m OMNIPAQUE IOHEXOL 350 MG/ML SOLN COMPARISON:  CTA abdomen and pelvis 10/28/2022 FINDINGS: VASCULAR Aorta: Normal caliber aorta without aneurysm, dissection, vasculitis or significant stenosis. There are moderate atherosclerotic calcifications throughout the aorta, unchanged. Celiac: Patent without evidence of aneurysm, dissection, vasculitis or significant stenosis. SMA: Patent without evidence of aneurysm, dissection, vasculitis or significant stenosis. Renals: Both renal arteries are patent without evidence of aneurysm, dissection, vasculitis, fibromuscular dysplasia or significant stenosis. IMA: Patent without evidence of aneurysm, dissection,  vasculitis or significant stenosis. Inflow: Patent without evidence of aneurysm, dissection, vasculitis or significant stenosis. Calcified atherosclerotic disease is present. Proximal Outflow: Calcified and noncalcified atherosclerotic disease again seen. There is stable mild-to-moderate narrowing of the bilateral superficial femoral arteries. Common femoral arteries are patent. No dissection or aneurysm. Veins: No obvious venous abnormality within the limitations of this arterial phase study. Review of the MIP images confirms the above findings. NON-VASCULAR Lower chest: There  is a small left pleural effusion with compressive atelectasis of the left lower lobe, unchanged. Hepatobiliary: Again seen is nodular liver contour compatible with cirrhosis. Gallbladder and bile ducts are within normal limits. Pancreas: Unremarkable. No pancreatic ductal dilatation or surrounding inflammatory changes. Spleen: Normal in size without focal abnormality. Adrenals/Urinary Tract: Adrenal glands are unremarkable. Kidneys are normal, without renal calculi, focal lesion, or hydronephrosis. Bladder is unremarkable. Stomach/Bowel: There is no evidence for active arterial extravasation. There is no bowel obstruction, pneumatosis or free air. There is diffuse colonic wall thickening most significant in the rectosigmoid colon which is similar to prior study. The stomach is decompressed and gastric wall thickening is not excluded, also unchanged. Appendix is within normal limits. Lymphatic: No enlarged lymph nodes are seen. Reproductive: Prostate is unremarkable. Other: Large volume ascites is unchanged. Diffuse body wall edema is unchanged. There is a small umbilical hernia containing ascites. Musculoskeletal: No acute fractures are seen. Degenerative changes affect both hips, right greater than left. IMPRESSION: 1. No evidence for active arterial extravasation. 2. No evidence for aortic dissection or aneurysm. Aortic Atherosclerosis  (ICD10-I70.0). NON-VASCULAR 1. No significant interval change in diffuse colonic wall thickening most significant in the rectosigmoid colon. Findings may be related to colitis or portal colopathy. 2. Stable large volume ascites and body wall edema. 3. Stable small left pleural effusion with compressive atelectasis of the left lower lobe. 4. Stable cirrhotic morphology of the liver. Electronically Signed   By: Ronney Asters M.D.   On: 10/31/2022 18:13   IR Paracentesis  Result Date: 10/30/2022 INDICATION: Recurrent ascites secondary to cirrhosis. Request for therapeutic paracentesis. EXAM: ULTRASOUND GUIDED PARACENTESIS MEDICATIONS: 1% lidocaine 10 mL COMPLICATIONS: None immediate. PROCEDURE: Informed written consent was obtained from the patient after a discussion of the risks, benefits and alternatives to treatment. A timeout was performed prior to the initiation of the procedure. Initial ultrasound scanning demonstrates a small amount of ascites within the right lower abdominal quadrant. The right lower abdomen was prepped and draped in the usual sterile fashion. 1% lidocaine was used for local anesthesia. Following this, a 19 gauge, 7-cm, Yueh catheter was introduced. An ultrasound image was saved for documentation purposes. The paracentesis was performed. The catheter was removed and a dressing was applied. The patient tolerated the procedure well without immediate post procedural complication. FINDINGS: A total of approximately 1.5 L of clear yellow fluid was removed. IMPRESSION: Successful ultrasound-guided paracentesis yielding 1.5 liters of peritoneal fluid. Procedure performed by: Gareth Eagle, PA-C Electronically Signed   By: Albin Felling M.D.   On: 10/30/2022 16:43     LOS: 3 days   Oren Binet, MD  [facility]    To contact the attending provider between 7A-7P or the covering provider during after hours 7P-7A, please log into the web site [email or web address] and access using universal  [place] password for that web site. If you do not have the password, please call the hospital operator.  11/01/2022, 11:08 AM

## 2022-11-01 NOTE — [place] FL2 (Signed)
[place] MEDICAID FL2 LEVEL OF CARE FORM     IDENTIFICATION  Patient Name: Jacob Bass Birthdate: 09-Jun-1961 Sex: male Admission Date (Current Location): 10/28/2022  [facility] and [place] Number:  Herbalist and Address:  [facility] [place] [address], [place], [place] [address]      Provider Number: [id number]  Attending Physician Name and Address:  Jonetta Osgood, MD  Relative Name and Phone Number:       Current Level of Care: Hospital Recommended Level of Care: [place] Prior Approval Number:    Date Approved/Denied:   PASRR Number: [id number]  Discharge Plan: SNF    Current Diagnoses: Patient Active Problem List   Diagnosis Date Noted   Gastrointestinal hemorrhage 10/29/2022   Bright red blood per rectum 10/28/2022   Stage 3b chronic kidney disease (CKD) ([place]) 10/28/2022   Heme positive stool 09/27/2022   Malnutrition of moderate degree 09/14/2022   Stroke (cerebrum) ([place]) 09/11/2022   Acute CVA (cerebrovascular accident) ([place]) 09/10/2022   History of CVA (cerebrovascular accident) 09/10/2022   HTN (hypertension) 09/10/2022   COPD (chronic obstructive pulmonary disease) ([place]) 09/10/2022   Anemia 09/10/2022   Cirrhosis ([place]) 09/10/2022   PUD (peptic ulcer disease) 09/10/2022   Hypernatremia 07/06/2022   Encephalopathy 07/01/2022   Altered mental status 06/30/2022   Bilateral lower extremity edema 06/30/2022   Poor fluid intake 06/30/2022   Hypoglycemia    Metabolic encephalopathy    Goals of care, counseling/discussion    Failure to thrive in adult 04/12/2022   Hypokalemia    Generalized weakness    Hypomagnesemia    COPD (chronic obstructive pulmonary disease) (HCC)    Dysphagia    Fall    Gastric ulcer    Cerebral thrombosis with cerebral infarction 10/13/2018   Cerebral embolism with cerebral infarction 10/13/2018   Subarachnoid hemorrhage 10/13/2018   Intracerebral hemorrhage 10/13/2018    Heme positive stool    Cirrhosis of liver without ascites ([place])    Chest pain 10/09/2018   Elevated troponin 10/09/2018   EKG abnormalities 10/09/2018   Homelessness 10/09/2018   Hypertension 10/09/2018   Anemia, macrocytic 10/09/2018   Thrombocytopenia ([place]) 10/09/2018    Orientation RESPIRATION BLADDER Height & Weight     Self  Normal Incontinent, External catheter Weight: 128 lb 15.5 oz (58.5 kg) Height:  '5\' 7"'$  (170.2 cm)  BEHAVIORAL SYMPTOMS/MOOD NEUROLOGICAL BOWEL NUTRITION STATUS      Incontinent Diet (See dc summary)  AMBULATORY STATUS COMMUNICATION OF NEEDS Skin   Extensive Assist Verbally Other (Comment), PU Stage and Appropriate Care (Deep tissue injury to heel; stage II on thigh; irritant dermatitis on buttocks; skin tear on knee; non pressure wound on abdomen)                       Personal Care Assistance Level of Assistance  Bathing, Feeding, Dressing Bathing Assistance: Maximum assistance Feeding assistance: Maximum assistance Dressing Assistance: Maximum assistance     Functional Limitations Info  Sight Sight Info: Impaired        SPECIAL CARE FACTORS FREQUENCY                       Contractures Contractures Info: Not present    Additional Factors Info  Code Status, Allergies Code Status Info: Full Allergies Info: Penicillins, Penicillins           Current Medications (11/01/2022):  This is the current hospital active medication list  Current Facility-Administered Medications  Medication Dose Route Frequency Provider Last Rate Last Admin   acetaminophen (TYLENOL) tablet 1,000 mg  1,000 mg Oral Q8H PRN Brimage, Vondra, DO       calcium-vitamin D (OSCAL WITH D) 500-5 MG-MCG per tablet 1 tablet  1 tablet Oral Q1200 Brimage, Vondra, DO   1 tablet at 10/31/22 1112   ciprofloxacin (CIPRO) IVPB 400 mg  400 mg Intravenous Q12H Sharyn Creamer, MD 200 mL/hr at 11/01/22 0928 400 mg at 11/01/22 [id number]   cyanocobalamin (VITAMIN B12) tablet 1,000 mcg   1,000 mcg Oral q AM Brimage, Vondra, DO   1,000 mcg at 11/01/22 [id number]   dextrose 5 % solution   Intravenous Continuous Jonetta Osgood, MD 75 mL/hr at 11/01/22 0924 New Bag at 11/01/22 0924   feeding supplement (ENSURE ENLIVE / ENSURE PLUS) liquid 237 mL  237 mL Oral TID Lyndee Hensen, DO   237 mL at [id number]   folic acid (FOLVITE) tablet 1 mg  1 mg Oral Daily Brimage, Vondra, DO   1 mg at 11/01/22 [id number]   Gerhardt's butt cream 1 Application  1 Application Topical QID PRN Brimage, Vondra, DO       ipratropium-albuterol (DUONEB) 0.5-2.5 (3) MG/3ML nebulizer solution 3 mL  3 mL Nebulization Q4H PRN Brimage, Vondra, DO       lactulose (CHRONULAC) 10 GM/15ML solution 10 g  10 g Oral BID Brimage, Vondra, DO   10 g at 11/01/22 0921   latanoprost (XALATAN) 0.005 % ophthalmic solution 1 drop  1 drop Both Eyes QHS Brimage, Vondra, DO   1 drop at 10/31/22 2159   magnesium oxide (MAG-OX) tablet 400 mg  400 mg Oral TID Lyndee Hensen, DO   400 mg at 11/01/22 [id number]   melatonin tablet 5 mg  5 mg Oral QHS Brimage, Vondra, DO   5 mg at 10/31/22 2012   mometasone-formoterol (DULERA) 200-5 MCG/ACT inhaler 2 puff  2 puff Inhalation BID Brimage, Vondra, DO   2 puff at 11/01/22 [id number]   multivitamin with minerals tablet 1 tablet  1 tablet Oral Daily Richarda Osmond, MD   1 tablet at 11/01/22 0921   pantoprazole (PROTONIX) EC tablet 40 mg  40 mg Oral BID Brimage, Vondra, DO   40 mg at 11/01/22 [id number]   potassium chloride (KLOR-CON) packet 40 mEq  40 mEq Oral Once Jonetta Osgood, MD       tamsulosin [employer]) capsule 0.4 mg  0.4 mg Oral q AM Brimage, Vondra, DO   0.4 mg at 11/01/22 0921   umeclidinium bromide (INCRUSE ELLIPTA) 62.5 MCG/ACT 1 puff  1 puff Inhalation q AM Brimage, Vondra, DO   1 puff at 11/01/22 0920     Discharge Medications: Please see discharge summary for a list of discharge medications.  Relevant Imaging Results:  Relevant Lab Results:   Additional Information SS#:  [id number]  Benard Halsted, LCSW

## 2022-11-01 NOTE — Progress Notes (Addendum)
Physical Therapy Treatment Patient Details Name: Jacob Bass MRN: [id number] DOB: [birth year] Today's Date: 11/01/2022   History of Present Illness 62 y.o. male presents to [facility] hospital on 10/28/2022 with bright red blood per rectum. PMH includes CVA, CHF, COPD, cirrhosis, gastric ulcer.    PT Comments    Pt was seen for mobility to roll on bed then to do there ex, but at some point became fairly passive with therapy.  Pt is potentially either tired or lethargic, but cannot get him to verbally engage with PT for follow through on the exercises.  Will follow along with him to encourage OOB to chair and mobility as tolerated, with SNF recommended due to his limited active participation and strength changes.  Follow acutely for goals of PT as are on POC.  Recommendations for follow up therapy are one component of a multi-disciplinary discharge planning process, led by the attending physician.  Recommendations may be updated based on patient status, additional functional criteria and insurance authorization.  Follow Up Recommendations  Skilled nursing-short term rehab (<3 hours/day) Can patient physically be transported by private vehicle: No   Assistance Recommended at Discharge Frequent or constant Supervision/Assistance  Patient can return home with the following Two people to help with walking and/or transfers;Two people to help with bathing/dressing/bathroom;Assistance with cooking/housework;Assistance with feeding;Direct supervision/assist for medications management;Direct supervision/assist for financial management;Assist for transportation;Help with stairs or ramp for entrance   Equipment Recommendations  Wheelchair (measurements PT);Wheelchair cushion (measurements PT);Hospital bed;Other (comment)    Recommendations for Other Services       Precautions / Restrictions Precautions Precautions: Fall Precaution Comments: baseline L hemiparesis, incontinent Restrictions Weight Bearing  Restrictions: No     Mobility  Bed Mobility Overal bed mobility: Needs Assistance Bed Mobility: Rolling Rolling: Mod assist         General bed mobility comments: struggling with any movement, lethargic    Transfers Overall transfer level: Needs assistance                 General transfer comment: declines to attempt    Ambulation/Gait               General Gait Details: unable   Stairs             Wheelchair Mobility    Modified Rankin (Stroke Patients Only)       Balance Overall balance assessment: Needs assistance     Sitting balance - Comments: declined                                    Cognition Arousal/Alertness: Awake/alert Behavior During Therapy: Flat affect Overall Cognitive Status: History of cognitive impairments - at baseline                                 General Comments: dementia        Exercises General Exercises - Lower Extremity Ankle Circles/Pumps: AAROM, Right, 5 reps Quad Sets: AROM, 10 reps Gluteal Sets: AROM, 10 reps Heel Slides: AAROM, 10 reps Hip ABduction/ADduction: AAROM, 10 reps Straight Leg Raises: AAROM, AROM, 10 reps    General Comments General comments (skin integrity, edema, etc.): pt declines to engage in conversation and continually looks to L side and will not assist with LE exercises with PT for more than 2-3 reps of each despite redirection  Pertinent Vitals/Pain Pain Assessment Pain Assessment: Faces Faces Pain Scale: Hurts a little bit Pain Location: generally Pain Descriptors / Indicators: Guarding    Home Living                          Prior Function            PT Goals (current goals can now be found in the care plan section) Acute Rehab PT Goals Patient Stated Goal: none stated Progress towards PT goals: Not progressing toward goals - comment    Frequency    Min 2X/week      PT Plan Current plan remains appropriate     Co-evaluation              AM-PAC PT "6 Clicks" Mobility   Outcome Measure  Help needed turning from your back to your side while in a flat bed without using bedrails?: A Lot Help needed moving from lying on your back to sitting on the side of a flat bed without using bedrails?: A Lot Help needed moving to and from a bed to a chair (including a wheelchair)?: Total Help needed standing up from a chair using your arms (e.g., wheelchair or bedside chair)?: Total Help needed to walk in hospital room?: Total Help needed climbing 3-5 steps with a railing? : Total 6 Click Score: 8    End of Session   Activity Tolerance: Patient limited by fatigue Patient left: in bed;with call bell/phone within reach Nurse Communication: Mobility status;Need for lift equipment PT Visit Diagnosis: Other abnormalities of gait and mobility (R26.89);Muscle weakness (generalized) (M62.81);Hemiplegia and hemiparesis Hemiplegia - Right/Left: Left Hemiplegia - dominant/non-dominant: Non-dominant Hemiplegia - caused by: Cerebral infarction     Time: [id number] PT Time Calculation (min) (ACUTE ONLY): 13 min  Charges:  $Therapeutic Exercise: 8-22 mins     Ramond Dial 11/01/2022, 4:09 PM  Mee Hives, PT PhD Acute Rehab Dept. Number: [facility] [facility] [place]

## 2022-11-02 DIAGNOSIS — E870 Hyperosmolality and hypernatremia: Secondary | ICD-10-CM | POA: Diagnosis not present

## 2022-11-02 DIAGNOSIS — J449 Chronic obstructive pulmonary disease, unspecified: Secondary | ICD-10-CM | POA: Diagnosis not present

## 2022-11-02 DIAGNOSIS — N1832 Chronic kidney disease, stage 3b: Secondary | ICD-10-CM | POA: Diagnosis not present

## 2022-11-02 DIAGNOSIS — K625 Hemorrhage of anus and rectum: Secondary | ICD-10-CM | POA: Diagnosis not present

## 2022-11-02 LAB — BASIC METABOLIC PANEL
Anion gap: 9 (ref 5–15)
BUN: 20 mg/dL (ref 8–23)
CO2: 22 mmol/L (ref 22–32)
Calcium: 7.9 mg/dL — ABNORMAL LOW (ref 8.9–10.3)
Chloride: 109 mmol/L (ref 98–111)
Creatinine, Ser: 1.32 mg/dL — ABNORMAL HIGH (ref 0.61–1.24)
GFR, Estimated: >60 mL/min (ref >60)
Glucose, Bld: 89 mg/dL (ref 70–99)
Potassium: 3.7 mmol/L (ref 3.5–5.1)
Sodium: 140 mmol/L (ref 135–145)

## 2022-11-02 LAB — CBC
HCT: 29.7 % — ABNORMAL LOW (ref 39.0–52.0)
Hemoglobin: 9.8 g/dL — ABNORMAL LOW (ref 13.0–17.0)
MCH: 32.5 pg (ref 26.0–34.0)
MCHC: 33 g/dL (ref 30.0–36.0)
MCV: 98.3 fL (ref 80.0–100.0)
Platelets: 51 10*3/uL — ABNORMAL LOW (ref 150–400)
RBC: 3.02 MIL/uL — ABNORMAL LOW (ref 4.22–5.81)
RDW: 20 % — ABNORMAL HIGH (ref 11.5–15.5)
WBC: 6 10*3/uL (ref 4.0–10.5)
nRBC: 0 % (ref 0.0–0.2)

## 2022-11-02 MED ORDER — CIPROFLOXACIN HCL 500 MG PO TABS: 500.0000 mg | ORAL_TABLET | Freq: Every day | ORAL | 0 refills | Status: DC

## 2022-11-02 MED ORDER — FUROSEMIDE 20 MG PO TABS: 20.0000 mg | ORAL_TABLET | Freq: Every day | ORAL | 0 refills | Status: DC

## 2022-11-02 NOTE — Progress Notes (Signed)
Patient ID: Jacob Bass, male   DOB: 05-Jun-1961, 62 y.o.   MRN: [id number]    Progress Note   Subjective   Day # 5 CC; decompensated cirrhosis, intermittent hematochezia  Patient has no complaints this morning, denies any abdominal pain, breakfast sitting beside him but has not attempted to eat No reported bleeding yesterday or today  Labs-BBC 6.0/hemoglobin 9.8/hematocrit 29.7/platelets 51 Sodium 140/potassium 3.7/BUN 20/creatinine 1.32   Objective   Vital signs in last 24 hours: Temp:  [97.6 F (36.4 C)-97.7 F (36.5 C)] 97.6 F (36.4 C) (03/01 0747) Pulse Rate:  [70-83] 72 (03/01 0747) Resp:  [13-18] 14 (03/01 0747) BP: (109-133)/(60-96) 133/75 (03/01 0747) SpO2:  [99 %-100 %] 100 % (03/01 0747) Last BM Date : 11/01/22 General:    Older African-American male in NAD to answer simple questions Heart:  Regular rate and rhythm; no murmurs Lungs: Respirations even and unlabored, lungs CTA bilaterally Abdomen: Protuberant with ascites, nontender normal bowel sounds. Extremities:  Without edema. Neurologic:  Alert and oriented x 2,, right hemiparesis Psych:  Cooperative. Normal mood and affect.  Intake/Output from previous day: 02/29 0701 - 03/01 0700 In: 3565.2 [I.V.:2963.1; IV Piggyback:602.1] Out: 700 [Urine:700] Intake/Output this shift: No intake/output data recorded.  Lab Results: Recent Labs    10/31/22 1959 11/01/22 0651 11/02/22 0743  WBC 5.1 6.4 6.0  HGB 10.5* 10.8* 9.8*  HCT 31.1* 32.3* 29.7*  PLT 51* 56* 51*   BMET Recent Labs    10/31/22 0258 11/01/22 0651 11/02/22 0743  NA 150* 145 140  K 3.5 3.4* 3.7  CL 118* 113* 109  CO2 '24 23 22  '$ GLUCOSE 94 108* 89  BUN 28* 25* 20  CREATININE 1.74* 1.49* 1.32*  CALCIUM 8.0* 7.9* 7.9*   LFT Recent Labs    10/31/22 0258  PROT 5.5*  ALBUMIN 2.3*  AST 47*  ALT 27  ALKPHOS 76  BILITOT 0.8   PT/INR No results for input(s): "LABPROT", "INR" in the last 72 hours.  Studies/Results: CT ANGIO GI  BLEED  Result Date: 10/31/2022 CLINICAL DATA:  Lower GI bleeding, rectal bleeding. EXAM: CTA ABDOMEN AND PELVIS WITHOUT AND WITH CONTRAST TECHNIQUE: Multidetector CT imaging of the abdomen and pelvis was performed using the standard protocol during bolus administration of intravenous contrast. Multiplanar reconstructed images and MIPs were obtained and reviewed to evaluate the vascular anatomy. RADIATION DOSE REDUCTION: This exam was performed according to the departmental dose-optimization program which includes automated exposure control, adjustment of the mA and/or kV according to patient size and/or use of iterative reconstruction technique. CONTRAST:  28m OMNIPAQUE IOHEXOL 350 MG/ML SOLN COMPARISON:  CTA abdomen and pelvis 10/28/2022 FINDINGS: VASCULAR Aorta: Normal caliber aorta without aneurysm, dissection, vasculitis or significant stenosis. There are moderate atherosclerotic calcifications throughout the aorta, unchanged. Celiac: Patent without evidence of aneurysm, dissection, vasculitis or significant stenosis. SMA: Patent without evidence of aneurysm, dissection, vasculitis or significant stenosis. Renals: Both renal arteries are patent without evidence of aneurysm, dissection, vasculitis, fibromuscular dysplasia or significant stenosis. IMA: Patent without evidence of aneurysm, dissection, vasculitis or significant stenosis. Inflow: Patent without evidence of aneurysm, dissection, vasculitis or significant stenosis. Calcified atherosclerotic disease is present. Proximal Outflow: Calcified and noncalcified atherosclerotic disease again seen. There is stable mild-to-moderate narrowing of the bilateral superficial femoral arteries. Common femoral arteries are patent. No dissection or aneurysm. Veins: No obvious venous abnormality within the limitations of this arterial phase study. Review of the MIP images confirms the above findings. NON-VASCULAR Lower chest: There is a small left  pleural effusion with  compressive atelectasis of the left lower lobe, unchanged. Hepatobiliary: Again seen is nodular liver contour compatible with cirrhosis. Gallbladder and bile ducts are within normal limits. Pancreas: Unremarkable. No pancreatic ductal dilatation or surrounding inflammatory changes. Spleen: Normal in size without focal abnormality. Adrenals/Urinary Tract: Adrenal glands are unremarkable. Kidneys are normal, without renal calculi, focal lesion, or hydronephrosis. Bladder is unremarkable. Stomach/Bowel: There is no evidence for active arterial extravasation. There is no bowel obstruction, pneumatosis or free air. There is diffuse colonic wall thickening most significant in the rectosigmoid colon which is similar to prior study. The stomach is decompressed and gastric wall thickening is not excluded, also unchanged. Appendix is within normal limits. Lymphatic: No enlarged lymph nodes are seen. Reproductive: Prostate is unremarkable. Other: Large volume ascites is unchanged. Diffuse body wall edema is unchanged. There is a small umbilical hernia containing ascites. Musculoskeletal: No acute fractures are seen. Degenerative changes affect both hips, right greater than left. IMPRESSION: 1. No evidence for active arterial extravasation. 2. No evidence for aortic dissection or aneurysm. Aortic Atherosclerosis (ICD10-I70.0). NON-VASCULAR 1. No significant interval change in diffuse colonic wall thickening most significant in the rectosigmoid colon. Findings may be related to colitis or portal colopathy. 2. Stable large volume ascites and body wall edema. 3. Stable small left pleural effusion with compressive atelectasis of the left lower lobe. 4. Stable cirrhotic morphology of the liver. Electronically Signed   By: Ronney Asters M.D.   On: 10/31/2022 18:13       Assessment / Plan:    #66 62 year old African-American male admitted from nursing home after having bright red blood per rectum Similar admission in January at  which time bleeding was felt likely hemorrhoidal, he did not manifest any significant bleeding. Patient had recurrent bleeding on 10/31/2022-CTA was done and negative for active bleeding, hemoglobin responded appropriately to transfusion, and no further episodes of bleeding  He does not have POA or guardianship and endoscopic evaluation not emergent so not pursued  #2 ascites-paracentesis x 1 this admission, has had some reaccumulation  Patient has not been on diuretics this admission due to acute kidney injury on chronic kidney disease He has also had soft blood pressures though improved today  Hesitant to restart any diuretics May need to continue as needed paracenteses     #3 acute kidney injury on chronic kidney disease stage III-some improvement with albumin  #4 recent CVA with neurogenic dysphagia #5 dementia #6 COPD #7 left pleural effusion   Plan-continue 2 g sodium diet with dysphagia 1 precautions Continue lactulose 10 g twice daily Continue Cipro 500 mg once daily for SBP plexus given prior history of SBP Continue twice daily PPI Continue to hold diuretics, will likely need as needed paracenteses  Ultimately needs to have guardianship/POA established as soon as possible so that can be evaluated from a palliative care standpoint  Okay for discharge from GI perspective       Principal Problem:   Bright red blood per rectum Active Problems:   Anemia, macrocytic   Thrombocytopenia (HCC)   Dysphagia   Hypomagnesemia   COPD (chronic obstructive pulmonary disease) (HCC)   Hypernatremia   HTN (hypertension)   COPD (chronic obstructive pulmonary disease) ([place])   Cirrhosis ([place])   Stage 3b chronic kidney disease (CKD) ([place])   Gastrointestinal hemorrhage     LOS: 4 days   Zia Najera EsterwoodPA-C  11/02/2022, 10:22 AM

## 2022-11-02 NOTE — TOC Progression Note (Addendum)
Transition of Care [facility]) - Progression Note    Patient Details  Name: Chayce Venzor MRN: [id number] Date of Birth: 07/07/1961  [facility] of [facility] CM/SW [place], LCSW Phone Number: 11/02/2022, 10:23 AM  Clinical Narrative:    10:23 AM-CSW left another voicemail for DSS Director Juanell Fairly [phone] to make her aware of patient's discharge today.  11:45 AM-CSW spoke with DSS Lavella Hammock to make her aware that CSW has not heard back from Juanell Fairly and to confirm that CSW can send patient back to [facility] today. She will let her supervisor know and call CSW back. Charna Archer aware of discharge.  2pm-CSW spoke with Elmyra Ricks again and she said she would call CSW back.  3pm-CSW contacted Juanell Fairly again and it went to voicemail. CSW spoke with [place] who agreed that patient can go ahead and be discharged to SNF since that is his former level of care and all attempts have been made to notify DSS.   Expected Discharge Plan: [place] Barriers to Discharge: Continued Medical Work up  Expected Discharge Plan and Services In-house Referral: Clinical Social Work   Post Acute Care Choice: [place] Living arrangements for the past 2 months: [place]                                       Social Determinants of Health (SDOH) Interventions SDOH Screenings   Food Insecurity: Unknown (10/29/2022)  Transportation Needs: Unknown (10/29/2022)  Utilities: Unknown (10/29/2022)  Tobacco Use: High Risk (10/30/2022)    Readmission Risk Interventions    10/30/2022    9:16 AM  Readmission Risk Prevention Plan  Transportation Screening Complete  Medication Review (RN Care Manager) Complete  PCP or Specialist appointment within 3-5 days of discharge Complete  HRI or Home Care Consult Complete  SW Recovery Care/Counseling Consult Complete  Palliative Care Screening Not [place]  Complete

## 2022-11-02 NOTE — Plan of Care (Signed)

## 2022-11-02 NOTE — Discharge Summary (Addendum)
PATIENT DETAILS Name: Jacob Bass Age: 62 y.o. Sex: male Date of Birth: September 26, 1960 MRN: [id number]. Admitting Physician: Richarda Osmond, MD [id number], Rich Reining., MD  Admit Date: 10/28/2022 Discharge date: 11/02/2022  Recommendations for Outpatient Follow-up:  Follow up with PCP in 1-2 weeks Please obtain CMP/CBC in one week Needs palliative care follow-up at SNF  Admitted From:  SNF  Disposition: Skilled nursing facility   Discharge Condition: fair  CODE STATUS:   Code Status: Full Code   Diet recommendation:  Diet Order             Diet - low sodium heart healthy           DIET - DYS 1 Room service appropriate? Yes; Fluid consistency: Thin  Diet effective now                    Brief Summary: Patient is a 62 y.o.  male with history of liver cirrhosis, CVA, dementia who presented from a local SNF for lower GI bleeding.   Significant events: 1/8-2/23>> hospitalization for GI bleed, blood loss anemia, AKI, CVA 2/25>> admit to [facility] for lower GI bleeding 2/28>> hematochezia x 2-repeat CT angio-no extravasation.  2 units PRBC transfused.   Significant studies: 2/20>> CT angio abdomen/pelvis: Poor study-but no active bleeding seen. 2/25>> CT angio abdomen/pelvis: No evidence of GI bleeding. 2/28>> CT angio abdomen/pelvis: No active GI bleeding.   Significant microbiology data: None   Procedures: 2/25>> paracentesis   Consults: GI  Brief Hospital Course: Lower GI bleeding with acute blood loss anemia Suspected diverticular etiology Had intermittent bleeding during this hospitalization-however for the past 2 days-no hematochezia-brown stools this morning on 3/1. Hb stable since last transfusion on 2/28 GI followed closely-but he was thought to be a poor candidate to pursue endoscopic evaluation given dementia-and inability to cooperate with bowel prep. Discussed with GI MD-Dr. Lorenso Courier today-okay to discharge from her point of view.   Alcoholic liver  cirrhosis HCV History of SBP 07/23/2022 Known portal gastropathy S/p paracentesis yesterday-belly is soft and not tense or extensively distended. On ciprofloxacin for SBP prophylaxis.   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   Hypernatremia Due to poor oral intake Improved with D5W infusion Encourage oral intake   AKI on CKD stage IIIb Hemodynamically mediated Improving with supportive care/IVF-creatinine close to baseline   Hypokalemia Repleted   History of recent acute CVA February 2024 No new deficits Not a candidate for ASA given active bleeding Not a candidate for statin per chart review due to underlying liver cirrhosis.   COPD Not in exacerbation Continue bronchodilators   HTN BP creeping up but still relatively stable Continue to hold metoprolol-attending MD at SNF to resume when able. Lisinopril was discontinued last admit   BPH Flomax   Dementia At risk for delirium Pleasantly confused   Dysphagia Continue dysphagia 1 diet Per review of prior notes during his most recent hospitalization-not a candidate for PEG tube placement given recurrent ascites   Palliative care Full code for now Spoke briefly with DSS social worker-Nicole Draper-he is not a candidate for aggressive care-if he deteriorates-May need to get palliative care involved again.  Thankfully-hematochezia has resolved-it is highly recommended that palliative care follow this patient at SNF.  Overall long-term prognosis is very poor.   BMI: Estimated body mass index is 20.2 kg/m  as calculated from the following:   Height as of this encounter: '5\' 7"'$  (1.702 m).   Weight as of this encounter: 58.5 kg.   Nutrition Status: Nutrition Problem: Increased nutrient needs Etiology: chronic  illness Signs/Symptoms: estimated needs Interventions: Ensure Enlive (each supplement provides 350kcal and 20 grams of protein), MVI    Pressure Ulcer: Pressure Injury 09/24/22 Heel Left;Posterior Deep Tissue Pressure Injury - Purple or maroon localized area of discolored intact skin or blood-filled blister due to damage of underlying soft tissue from pressure and/or shear. Round, dark brown spot (Active)  09/24/22 0903  Location: Heel  Location Orientation: Left;Posterior  Staging: Deep Tissue Pressure Injury - Purple or maroon localized area of discolored intact skin or blood-filled blister due to damage of underlying soft tissue from pressure and/or shear.  Wound Description (Comments): Round, dark brown spot  Present on Admission: No  Dressing Type Foam - Lift dressing to assess site every shift 11/02/22 0800     Pressure Injury 10/02/22 Thigh Left;Posterior;Proximal Stage 2 -  Partial thickness loss of dermis presenting as a shallow open injury with a red, pink wound bed without slough. Pink (Active)  10/02/22 0700  Location: Thigh  Location Orientation: Left;Posterior;Proximal  Staging: Stage 2 -  Partial thickness loss of dermis presenting as a shallow open injury with a red, pink wound bed without slough.  Wound Description (Comments): Pink  Present on Admission:   Dressing Type Foam - Lift dressing to assess site every shift 11/02/22 0800     Discharge Diagnoses:  Principal Problem:   Bright red blood per rectum Active Problems:   Anemia, macrocytic   Thrombocytopenia (HCC)   Dysphagia   Hypomagnesemia   COPD (chronic obstructive pulmonary disease) (HCC)   Hypernatremia   HTN (hypertension)   COPD (chronic obstructive pulmonary disease) (HCC)   Cirrhosis (HCC)   Stage 3b chronic kidney disease (CKD) (HCC)   Gastrointestinal hemorrhage   Discharge Instructions:  Activity:  As tolerated with Full fall precautions use walker/cane & assistance as needed  Discharge  Instructions     Diet - low sodium heart healthy   Complete by: As directed    Discharge wound care:   Complete by: As directed    Pressure Injury 09/24/22 Heel Left;Posterior Deep Tissue Pressure Injury - Purple or maroon localized area of discolored intact skin or blood-filled blister due to damage of underlying soft tissue from pressure and/or shear. Round, dark brown spot  Pressure Injury 10/02/22 Thigh Left;Posterior;Proximal Stage 2 -  Partial thickness loss of dermis presenting as a shallow open injury with a red, pink wound bed without slough. Pink   Increase activity slowly   Complete by: As directed       Allergies as of 11/02/2022       Reactions   Penicillins Hives   Penicillins Other (See Comments)   Unknown reaction Listed on MAR        Medication List     STOP taking these medications    lisinopril 5 MG tablet Commonly known as: ZESTRIL   metoprolol tartrate 25 MG tablet Commonly known as: LOPRESSOR   pantoprazole 40 MG tablet Commonly known as: PROTONIX       TAKE these medications    acetaminophen 500 MG tablet Commonly known as: TYLENOL Take 1,000 mg by mouth every 8 (eight) hours as needed for moderate pain (severe pain).   albuterol 108 (90 Base) MCG/ACT inhaler Commonly known as: VENTOLIN HFA Inhale 2 puffs into the lungs every  4 (four) hours as needed for wheezing or shortness of breath.   Calcium + Vitamin D3 600-5 MG-MCG Tabs Generic drug: Calcium Carb-Cholecalciferol Take 1 tablet by mouth daily at 12 noon. (1200)   ciprofloxacin 500 MG tablet Commonly known as: Cipro Take 1 tablet (500 mg total) by mouth daily at 8 pm.   cyanocobalamin 1000 MCG tablet Commonly known as: VITAMIN B12 Take 1,000 mcg by mouth in the morning. (0900) What changed: Another medication with the same name was removed. Continue taking this medication, and follow the directions you see here.   Nutritional Drink Liqd Take 120 mLs by mouth 3 (three) times  daily. House supplement (0900, 1400, 2100)   feeding supplement Liqd Take 237 mLs by mouth daily at 3 pm.   fluticasone-salmeterol 250-50 MCG/ACT Aepb Commonly known as: ADVAIR Inhale 1 puff into the lungs in the morning and at bedtime.   folic acid 1 MG tablet Commonly known as: FOLVITE Take 1 tablet (1 mg total) by mouth daily.   furosemide 20 MG tablet Commonly known as: LASIX Take 1 tablet (20 mg total) by mouth daily. What changed: how much to take   Gerhardt's butt cream Crea Apply 1 Application topically 4 (four) times daily as needed for irritation.   ipratropium-albuterol 0.5-2.5 (3) MG/3ML Soln Commonly known as: DUONEB Take 3 mLs by nebulization every 4 (four) hours as needed. What changed: Another medication with the same name was removed. Continue taking this medication, and follow the directions you see here.   lactulose 10 GM/15ML solution Commonly known as: CHRONULAC Take 15 mLs (10 g total) by mouth 2 (two) times daily. What changed: Another medication with the same name was removed. Continue taking this medication, and follow the directions you see here.   latanoprost 0.005 % ophthalmic solution Commonly known as: XALATAN Place 1 drop into both eyes at bedtime. (0900)   loperamide 2 MG tablet Commonly known as: IMODIUM A-D Take 4 mg by mouth every 6 (six) hours as needed for diarrhea or loose stools.   magnesium oxide 400 (240 Mg) MG tablet Commonly known as: MAG-OX Take 400 mg by mouth 3 (three) times daily.   melatonin 5 MG Tabs Take 5 mg by mouth at bedtime.   omeprazole 20 MG tablet Commonly known as: PRILOSEC OTC Take 40 mg by mouth 2 (two) times daily.   senna-docusate 8.6-50 MG tablet Commonly known as: Senokot-S Take 1 tablet by mouth at bedtime as needed for mild constipation or moderate constipation.   tamsulosin 0.4 MG Caps capsule Commonly known as: FLOMAX Take 0.4 mg by mouth in the morning. (0900) What changed: Another  medication with the same name was removed. Continue taking this medication, and follow the directions you see here.   tiotropium 18 MCG inhalation capsule Commonly known as: SPIRIVA Place 18 mcg into inhaler and inhale daily.   Zinc Oxide 10 % Oint Apply 1 Application topically every 6 (six) hours as needed (To buttocks for irritation).               Discharge Care Instructions  (From admission, onward)           Start     Ordered   11/02/22 0000  Discharge wound care:       Comments: Pressure Injury 09/24/22 Heel Left;Posterior Deep Tissue Pressure Injury - Purple or maroon localized area of discolored intact skin or blood-filled blister due to damage of underlying soft tissue from pressure and/or shear. Round, dark brown spot  Pressure Injury 10/02/22 Thigh Left;Posterior;Proximal Stage 2 -  Partial thickness loss of dermis presenting as a shallow open injury with a red, pink wound bed without slough. Pink   11/02/22 1032            Contact information for follow-up providers     Tyrone Schimke., MD. Schedule an appointment as soon as possible for a visit in 1 week(s).   Specialty: Internal Medicine Contact information: [facility] [address] [place] [address] [phone]              Contact information for after-discharge care     Destination     [facility] Preferred SNF .   Service: Skilled Nursing Contact information: [place] [address] [phone]                    Allergies  Allergen Reactions   Penicillins Hives   Penicillins Other (See Comments)    Unknown reaction Listed on MAR     Other Procedures/Studies: CT ANGIO GI BLEED  Result Date: 10/31/2022 CLINICAL DATA:  Lower GI bleeding, rectal bleeding. EXAM: CTA ABDOMEN AND PELVIS WITHOUT AND WITH CONTRAST TECHNIQUE: Multidetector CT imaging of the abdomen and pelvis was performed using the  standard protocol during bolus administration of intravenous contrast. Multiplanar reconstructed images and MIPs were obtained and reviewed to evaluate the vascular anatomy. RADIATION DOSE REDUCTION: This exam was performed according to the departmental dose-optimization program which includes automated exposure control, adjustment of the mA and/or kV according to patient size and/or use of iterative reconstruction technique. CONTRAST:  42m OMNIPAQUE IOHEXOL 350 MG/ML SOLN COMPARISON:  CTA abdomen and pelvis 10/28/2022 FINDINGS: VASCULAR Aorta: Normal caliber aorta without aneurysm, dissection, vasculitis or significant stenosis. There are moderate atherosclerotic calcifications throughout the aorta, unchanged. Celiac: Patent without evidence of aneurysm, dissection, vasculitis or significant stenosis. SMA: Patent without evidence of aneurysm, dissection, vasculitis or significant stenosis. Renals: Both renal arteries are patent without evidence of aneurysm, dissection, vasculitis, fibromuscular dysplasia or significant stenosis. IMA: Patent without evidence of aneurysm, dissection, vasculitis or significant stenosis. Inflow: Patent without evidence of aneurysm, dissection, vasculitis or significant stenosis. Calcified atherosclerotic disease is present. Proximal Outflow: Calcified and noncalcified atherosclerotic disease again seen. There is stable mild-to-moderate narrowing of the bilateral superficial femoral arteries. Common femoral arteries are patent. No dissection or aneurysm. Veins: No obvious venous abnormality within the limitations of this arterial phase study. Review of the MIP images confirms the above findings. NON-VASCULAR Lower chest: There is a small left pleural effusion with compressive atelectasis of the left lower lobe, unchanged. Hepatobiliary: Again seen is nodular liver contour compatible with cirrhosis. Gallbladder and bile ducts are within normal limits. Pancreas: Unremarkable. No  pancreatic ductal dilatation or surrounding inflammatory changes. Spleen: Normal in size without focal abnormality. Adrenals/Urinary Tract: Adrenal glands are unremarkable. Kidneys are normal, without renal calculi, focal lesion, or hydronephrosis. Bladder is unremarkable. Stomach/Bowel: There is no evidence for active arterial extravasation. There is no bowel obstruction, pneumatosis or free air. There is diffuse colonic wall thickening most significant in the rectosigmoid colon which is similar to prior study. The stomach is decompressed and gastric wall thickening is not excluded, also unchanged. Appendix is within normal limits. Lymphatic: No enlarged lymph nodes are seen. Reproductive: Prostate is unremarkable. Other: Large volume ascites is unchanged. Diffuse body wall edema is unchanged. There is a small umbilical hernia containing ascites. Musculoskeletal: No acute fractures are seen. Degenerative changes affect  both hips, right greater than left. IMPRESSION: 1. No evidence for active arterial extravasation. 2. No evidence for aortic dissection or aneurysm. Aortic Atherosclerosis (ICD10-I70.0). NON-VASCULAR 1. No significant interval change in diffuse colonic wall thickening most significant in the rectosigmoid colon. Findings may be related to colitis or portal colopathy. 2. Stable large volume ascites and body wall edema. 3. Stable small left pleural effusion with compressive atelectasis of the left lower lobe. 4. Stable cirrhotic morphology of the liver. Electronically Signed   By: Ronney Asters M.D.   On: 10/31/2022 18:13   IR Paracentesis  Result Date: 10/30/2022 INDICATION: Recurrent ascites secondary to cirrhosis. Request for therapeutic paracentesis. EXAM: ULTRASOUND GUIDED PARACENTESIS MEDICATIONS: 1% lidocaine 10 mL COMPLICATIONS: None immediate. PROCEDURE: Informed written consent was obtained from the patient after a discussion of the risks, benefits and alternatives to treatment. A timeout  was performed prior to the initiation of the procedure. Initial ultrasound scanning demonstrates a small amount of ascites within the right lower abdominal quadrant. The right lower abdomen was prepped and draped in the usual sterile fashion. 1% lidocaine was used for local anesthesia. Following this, a 19 gauge, 7-cm, Yueh catheter was introduced. An ultrasound image was saved for documentation purposes. The paracentesis was performed. The catheter was removed and a dressing was applied. The patient tolerated the procedure well without immediate post procedural complication. FINDINGS: A total of approximately 1.5 L of clear yellow fluid was removed. IMPRESSION: Successful ultrasound-guided paracentesis yielding 1.5 liters of peritoneal fluid. Procedure performed by: Gareth Eagle, PA-C Electronically Signed   By: Albin Felling M.D.   On: 10/30/2022 16:43   DG CHEST PORT 1 VIEW  Result Date: 10/28/2022 CLINICAL DATA:  Pleural effusion. EXAM: PORTABLE CHEST 1 VIEW COMPARISON:  CT performed today FINDINGS: This is a low volume study. The cardiomediastinal silhouette is unremarkable. LEFT LOWER lung opacity is compatible with effusion and atelectasis. There is no evidence of pneumothorax or acute bony abnormality. IMPRESSION: LEFT LOWER lung opacity compatible with effusion and atelectasis as seen on CT earlier today. Electronically Signed   By: Margarette Canada M.D.   On: 10/28/2022 21:14   CT Angio Abd/Pel W and/or Wo Contrast  Result Date: 10/28/2022 CLINICAL DATA:  Lower GI bleed.  Bright red blood from rectum. EXAM: CTA ABDOMEN AND PELVIS WITHOUT AND WITH CONTRAST TECHNIQUE: Multidetector CT imaging of the abdomen and pelvis was performed using the standard protocol during bolus administration of intravenous contrast. Multiplanar reconstructed images and MIPs were obtained and reviewed to evaluate the vascular anatomy. RADIATION DOSE REDUCTION: This exam was performed according to the departmental  dose-optimization program which includes automated exposure control, adjustment of the mA and/or kV according to patient size and/or use of iterative reconstruction technique. CONTRAST:  29m OMNIPAQUE IOHEXOL 350 MG/ML SOLN COMPARISON:  10/23/2022 FINDINGS: VASCULAR Aorta: Normal caliber aorta without aneurysm, dissection, vasculitis or significant stenosis. Moderate calcified atherosclerotic plaque. Celiac: Patent without evidence of aneurysm, dissection, vasculitis or significant stenosis. SMA: Patent without evidence of aneurysm, dissection, vasculitis or significant stenosis. Renals: Both renal arteries are patent without evidence of aneurysm, dissection, vasculitis, fibromuscular dysplasia or significant stenosis. IMA: Patent without evidence of aneurysm, dissection, vasculitis or significant stenosis. Inflow: Patent without aneurysm or dissection. Calcified plaque causes up to mild narrowing in the left common iliac artery and moderate narrowing of the left internal iliac artery. Proximal Outflow: No aneurysm or dissection. Calcified and noncalcified atherosclerotic plaque causes mild-moderate narrowing of the bilateral common femoral, visualized deep femoral, and visualized superficial  femoral arteries. Veins: No evidence of venous thrombosis in the IVC or iliac veins. Portal vein is patent. Prominent gastric varices. Review of the MIP images confirms the above findings. NON-VASCULAR Lower chest: Moderate left pleural effusion and associated atelectasis. Hepatobiliary: Nodular hepatic contour compatible with cirrhosis. Mild gallbladder wall thickening likely secondary to cirrhosis. No biliary dilation. Pancreas: Unremarkable Spleen: Normal in size without focal abnormality. Adrenals/Urinary Tract: Normal adrenal glands. No urinary calculi or hydronephrosis. Unremarkable bladder. Stomach/Bowel: Diffuse circumferential wall thickening throughout the colon and rectum. Diffuse wall thickening of the stomach and  small bowel. Respiratory motion obscures more detailed evaluation of the bowel in the upper and central abdomen. Normal appendix. Lymphatic: No definite lymphadenopathy. Reproductive: Unremarkable. Other: Large volume low-density ascites in the abdomen and pelvis. Diffuse mesenteric stranding. Body wall anasarca. Musculoskeletal: Advanced arthritis right hip. No acute osseous abnormality. IMPRESSION: 1. No evidence of active GI bleed. 2. 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 3. Cirrhosis with large volume ascites. 4. Moderate left pleural effusion. 5.  Aortic Atherosclerosis (ICD10-I70.0). Electronically Signed   By: Placido Sou M.D.   On: 10/28/2022 19:33   IR Paracentesis  Result Date: 10/24/2022 INDICATION: Patient with history of cirrhosis and recurrent ascites. Request for therapeutic paracentesis. EXAM: ULTRASOUND GUIDED RIGHT LOWER QUADRANT PARACENTESIS MEDICATIONS: 1% plain lidocaine, 5 mL COMPLICATIONS: None immediate. PROCEDURE: Informed written consent was obtained from the patient after a discussion of the risks, benefits and alternatives to treatment. A timeout was performed prior to the initiation of the procedure. Initial ultrasound scanning demonstrates a small to moderate amount of ascites within the right lower abdominal quadrant. The right lower abdomen was prepped and draped in the usual sterile fashion. 1% lidocaine was used for local anesthesia. Following this, a 19 gauge, 7-cm, Yueh catheter was introduced. An ultrasound image was saved for documentation purposes. The paracentesis was performed. The catheter was removed and a dressing was applied. The patient tolerated the procedure well without immediate post procedural complication. FINDINGS: A total of approximately 1 L of clear yellow fluid was removed. IMPRESSION: Successful ultrasound-guided paracentesis  yielding 1 L of peritoneal fluid. Read by: Ascencion Dike PA-C Electronically Signed   By: Michaelle Birks M.D.   On: 10/24/2022 14:52   CT Angio Abd/Pel w/ and/or w/o  Result Date: 10/23/2022 CLINICAL DATA:  62 year old male with history of cirrhosis and lower gastrointestinal hemorrhage. EXAM: CTA ABDOMEN AND PELVIS WITHOUT AND WITH CONTRAST TECHNIQUE: Multidetector CT imaging of the abdomen and pelvis was performed using the standard protocol during bolus administration of intravenous contrast. Multiplanar reconstructed images and MIPs were obtained and reviewed to evaluate the vascular anatomy. RADIATION DOSE REDUCTION: This exam was performed according to the departmental dose-optimization program which includes automated exposure control, adjustment of the mA and/or kV according to patient size and/or use of iterative reconstruction technique. CONTRAST:  20m OMNIPAQUE IOHEXOL 350 MG/ML SOLN COMPARISON:  None Available. FINDINGS: VASCULAR Aorta: Normal caliber aorta without aneurysm, dissection, vasculitis or significant stenosis. Scattered fibrofatty and calcific atherosclerotic changes, most prominent at the infrarenal segment. Celiac: Patent without evidence of aneurysm, dissection, vasculitis or significant stenosis. SMA: Patent without evidence of aneurysm, dissection, vasculitis or significant stenosis. Renals: Single bilateral renal arteries are patent without evidence of aneurysm, dissection, vasculitis, fibromuscular dysplasia or significant stenosis. IMA: Patent without evidence of aneurysm, dissection, vasculitis or significant stenosis. Inflow: Patent without evidence of aneurysm, dissection, vasculitis or significant stenosis. Proximal  Outflow: Bilateral common femoral and visualized portions of the superficial and profunda femoral arteries are patent without evidence of aneurysm, dissection, vasculitis or significant stenosis. Veins: The hepatic veins are widely patent. The portal system is  widely patent and normal in caliber. No evidence of portosystemic shunt or ectopic varices. The renal veins are patent bilaterally in standard anatomic configuration. No evidence of iliocaval thrombosis or anomaly. Review of the MIP images confirms the above findings. NON-VASCULAR Lower chest: Partially visualized small left pleural effusion with associated left basilar passive atelectasis. The heart is normal in size. No pericardial effusion. Coronary atherosclerotic calcifications. Hepatobiliary: Diffusely nodular contour. Diffusely shrunken size. No focal masses are visualized. The gallbladder is present and mildly distended without definite evidence of cholelithiasis or wall thickening. No intra or extrahepatic biliary ductal dilation. Pancreas: Unremarkable. No pancreatic ductal dilatation or surrounding inflammatory changes. Spleen: Normal in size without focal abnormality. Adrenals/Urinary Tract: Adrenal glands are unremarkable. Kidneys are normal, without renal calculi, focal lesion, or hydronephrosis. Bladder is unremarkable. Stomach/Bowel: Stomach is within normal limits. Unfortunately previously administered barium from the same day during swallow study opacifies the gastrointestinal tract from duodenum to the level of the splenic flexure. There is streak artifact from the barium which severely limits CT evaluation of the bowel, specifically for evidence of hemorrhage. No evidence of bowel wall thickening, distention, or inflammatory changes. Lymphatic: No abdominopelvic lymphadenopathy. Reproductive: Prostate is unremarkable. Other: Moderate ascites. Small fat, fluid, and bowel containing umbilical hernia without complicating features. Musculoskeletal: Advanced degenerative changes about the hips bilaterally, right greater than left. No acute osseous abnormality. IMPRESSION: 1. Severely limited evaluation for gastrointestinal hemorrhage due to recently ingested barium within the gastrointestinal tract  spanning from the duodenum to the splenic flexure. Until barium clears from gastrointestinal tract, nuclear medicine tagged red blood cell scan may be more illustrative to localize etiology of hemorrhage, as clinically indicated. 2. Small left pleural effusion with associated left basilar passive atelectasis. 3. Morphologic changes of hepatic cirrhosis with moderate ascites. 4. Coronary and aortic atherosclerosis (ICD10-I70.0). 5. Severe right hip degenerative change. Ruthann Cancer, MD [facility] Electronically Signed   By: Ruthann Cancer M.D.   On: 10/23/2022 16:02   DG Swallowing Func-Speech Pathology  Result Date: 10/23/2022 Table formatting from the original result was not included. Modified Barium Swallow Study Patient Details Name: Dmetrius Barnfield MRN: [id number] Date of Birth: 1961/08/12 Today's Date: 10/23/2022 HPI/PMH: HPI: Sami Guinan is a 62 y.o. male who presented to ED from [facility] via EMS with left sided weakness, speech changes, wheezing. PMH/PSH includes prior CVA, subarachnoid hemorrhage, cirrhosis, hep C, hx of etOH abuse, dementia, COPD.  Pt known to SLP from prior admissions, with baseline dysarthria and oral dysphagia. He was most recently seen by SLP on 04/13/22 with recommendations for Dysphagia 1 solids and thin liquids. NPO with Cortrak, can't get PEG due to acities. Repeat MBS for possible liquid upgrade. Clinical Impression: Clinical Impression: Pt's oral phase of swallow has improved since [facility] 10/02/22. He continues with decreased bolus control however there was less escape to floor of mandible. Lingual residue present after all trials but volume was decreased from prior studies. Regular solid texture not given during study however therapist will work with pt at bedside to advance from puree. There was no aspiration however laryngeal penetration present with thin while using a straw. Decreased laryngeal elevation prevented early  closure with barium entering vestibule prior to vestibule closure. Subsequent cup sips decreased velocity and prevented  penetration consistently. Swallows were initiated at valleculae and several times in pyriform sinuses.Tongue base retraction is mildly reduced with mild valleculae and pyriform sinus residue with thin and nectar (no significant residue with puree or honey thick). Therapist recommends upgrade to thin liquids via CUP only, NO straws, sitting in upright position, small sips, cue to clear throat and pills crushed. Continue puree and SLP will work with pt in attempts to upgrade to chewable solids. Factors that may increase risk of adverse event in presence of aspiration ([place] 2021): Factors that may increase risk of adverse event in presence of aspiration ([place] 2021): Reduced cognitive function; Frail or deconditioned Recommendations/Plan: Swallowing Evaluation Recommendations Swallowing Evaluation Recommendations Recommendations: PO diet PO Diet Recommendation: Dysphagia 1 (Pureed); Thin liquids (Level 0) Liquid Administration via: Cup; No straw Medication Administration: Crushed with puree Supervision: Staff to assist with self-feeding; Full assist for feeding Swallowing strategies  : Slow rate; Small bites/sips; Clear throat intermittently Postural changes: Position pt fully upright for meals Oral care recommendations: Oral care BID (2x/day) Treatment Plan Treatment Plan Treatment recommendations: Therapy as outlined in treatment plan below Follow-up recommendations: Skilled nursing-short term rehab (<3 hours/day) Functional status assessment: Patient has had a recent decline in their functional status and demonstrates the ability to make significant improvements in function in a reasonable and predictable amount of time. Treatment frequency: Min 1x/week Treatment duration: 3 weeks Interventions: Compensatory techniques; Diet toleration management by SLP; Trials of upgraded  texture/liquids Recommendations Recommendations for follow up therapy are one component of a multi-disciplinary discharge planning process, led by the attending physician.  Recommendations may be updated based on patient status, additional functional criteria and insurance authorization. Assessment: Orofacial Exam: Orofacial Exam Oral Cavity: Oral Hygiene: WFL Oral Cavity - Dentition: Edentulous Orofacial Anatomy: WFL Oral Motor/Sensory Function: WFL Anatomy: Anatomy: Suspected cervical osteophytes Thin Liquids: Thin Liquids (Level 0) Thin Liquids : Impaired Bolus delivery method: Spoon; Cup; Straw Thin Liquid - Impairment: Oral Impairment; Pharyngeal impairment Lip Closure: No labial escape Tongue control during bolus hold: Escape to lateral buccal cavity/floor of mouth Bolus transport/lingual motion: Brisk tongue motion Oral residue: Residue collection on oral structures Location of oral residue : Tongue Initiation of swallow : Valleculae Soft palate elevation: No bolus between soft palate (SP)/pharyngeal wall (PW) Laryngeal elevation: Complete superior movement of thyroid cartilage with complete approximation of arytenoids to epiglottic petiole Anterior hyoid excursion: Complete Epiglottic movement: Complete Laryngeal vestibule closure: Complete, no air/contrast in laryngeal vestibule Pharyngeal stripping wave : Present - complete Pharyngeal contraction (A/P view only): N/A Pharyngoesophageal segment opening: Complete distension and complete duration, no obstruction of flow Tongue base retraction: Trace column of contrast or air between tongue base and PPW Pharyngeal residue: Trace residue within or on pharyngeal structures Location of pharyngeal residue: Valleculae; Pyriform sinuses Penetration/Aspiration Scale (PAS) score: 1.  Material does not enter airway  Mildly Thick Liquids: Mildly thick liquids (Level 2, nectar thick) Mildly thick liquids (Level 2, nectar thick): Impaired Bolus delivery method: Spoon; Cup  Mildly Thick Liquid - Impairment: Oral Impairment; Pharyngeal impairment Lip Closure: No labial escape Tongue control during bolus hold: Escape to lateral buccal cavity/floor of mouth Bolus transport/lingual motion: Brisk tongue motion Oral residue: Residue collection on oral structures Location of oral residue : Tongue Initiation of swallow : Pyriform sinuses Soft palate elevation: No bolus between soft palate (SP)/pharyngeal wall (PW) Laryngeal elevation: Complete superior movement of thyroid cartilage with complete approximation of arytenoids to epiglottic petiole Anterior hyoid excursion: Complete Epiglottic movement: Complete  Laryngeal vestibule closure: Complete, no air/contrast in laryngeal vestibule Pharyngeal stripping wave : Present - complete Pharyngeal contraction (A/P view only): N/A Pharyngoesophageal segment opening: Complete distension and complete duration, no obstruction of flow Tongue base retraction: Trace column of contrast or air between tongue base and PPW Pharyngeal residue: Trace residue within or on pharyngeal structures Location of pharyngeal residue: Pyriform sinuses Penetration/Aspiration Scale (PAS) score: 1.  Material does not enter airway  Moderately Thick Liquids: Moderately thick liquids (Level 3, honey thick) Moderately thick liquids (Level 3, honey thick): Impaired Bolus delivery method: Spoon Moderately Thick Liquid - Impairment: Oral Impairment; Pharyngeal impairment Lip Closure: No labial escape Tongue control during bolus hold: Cohesive bolus between tongue to palatal seal Bolus transport/lingual motion: Brisk tongue motion Oral residue: Residue collection on oral structures Location of oral residue : Tongue Initiation of swallow : Valleculae Soft palate elevation: No bolus between soft palate (SP)/pharyngeal wall (PW) Laryngeal elevation: Complete superior movement of thyroid cartilage with complete approximation of arytenoids to epiglottic petiole Anterior hyoid excursion:  Complete Epiglottic movement: Complete Laryngeal vestibule closure: Complete, no air/contrast in laryngeal vestibule Pharyngeal stripping wave : Present - complete Pharyngeal contraction (A/P view only): N/A Pharyngoesophageal segment opening: Complete distension and complete duration, no obstruction of flow Tongue base retraction: Trace column of contrast or air between tongue base and PPW Pharyngeal residue: Trace residue within or on pharyngeal structures Location of pharyngeal residue: Tongue base Penetration/Aspiration Scale (PAS) score: 1.  Material does not enter airway  Puree: Puree Puree: Impaired Puree - Impairment: Oral Impairment; Pharyngeal impairment Lip Closure: No labial escape Bolus transport/lingual motion: Slow tongue motion Oral residue: Trace residue lining oral structures Location of oral residue : Tongue Initiation of swallow: Valleculae Soft palate elevation: No bolus between soft palate (SP)/pharyngeal wall (PW) Laryngeal elevation: Complete superior movement of thyroid cartilage with complete approximation of arytenoids to epiglottic petiole Anterior hyoid excursion: Complete Epiglottic movement: Complete Laryngeal vestibule closure: Complete, no air/contrast in laryngeal vestibule Pharyngeal stripping wave : Present - complete Pharyngeal contraction (A/P view only): N/A Pharyngoesophageal segment opening: Complete distension and complete duration, no obstruction of flow Tongue base retraction: Trace column of contrast or air between tongue base and PPW Pharyngeal residue: Trace residue within or on pharyngeal structures Location of pharyngeal residue: Tongue base Penetration/Aspiration Scale (PAS) score: 1.  Material does not enter airway Solid: Solid Solid: Not Tested Pill: Pill Pill: Not Tested Compensatory Strategies: Compensatory Strategies Compensatory strategies: Yes Straw: Ineffective (penetrated with straw) Ineffective Straw: Thin liquid (Level 0)   General Information: Caregiver  present: No  Diet Prior to this Study: Dysphagia 1 (pureed); Moderately thick liquids (Level 3, honey thick)   Temperature : Normal   Respiratory Status: WFL   Supplemental O2: None (Room air)   History of Recent Intubation: No  Behavior/Cognition: Alert; Cooperative; Pleasant mood; Requires cueing Self-Feeding Abilities: Needs assist with self-feeding Baseline vocal quality/speech: Hypophonia/low volume No data recorded Volitional Swallow: -- (difficulty initiating) No data recorded Goal Planning: Prognosis for improved oropharyngeal function: Good Barriers to Reach Goals: Cognitive deficits; Severity of deficits No data recorded Patient/Family Stated Goal: continuing attempts as patient cannot have PEG and will not be able to have long term Cortrak use Consulted and agree with results and recommendations: Pt unable/family or caregiver not available; Physician Pain: Pain Assessment Pain Assessment: Faces Pain Score: 0 Faces Pain Scale: 0 Breathing: 0 Negative Vocalization: 0 Facial Expression: 0 Body Language: 0 Consolability: 0 PAINAD Score: 0 Facial Expression: 0 Body Movements: 0  Muscle Tension: 0 Compliance with ventilator (intubated pts.): N/A Vocalization (extubated pts.): 0 CPOT Total: 0 Pain Location: generalized with mobility and hygiene assist Pain Descriptors / Indicators: Discomfort; Grimacing Pain Intervention(s): Monitored during session; Repositioned; Limited activity within patient's tolerance End of Session: Start Time:SLP Start Time (ACUTE ONLY): 1108 Stop Time: SLP Stop Time (ACUTE ONLY): 1120 Time Calculation:SLP Time Calculation (min) (ACUTE ONLY): 12 min Charges: SLP Evaluations $ SLP Speech Visit: 1 Visit SLP Evaluations $BSS Swallow: 1 Procedure $MBS Swallow: 1 Procedure $ SLP EVAL LANGUAGE/SOUND PRODUCTION: 1 Procedure $Swallowing Treatment: 1 Procedure $Speech Treatment for Individual: 1 Procedure SLP visit diagnosis: SLP Visit Diagnosis: Dysphagia, oropharyngeal phase (R13.12) Past  Medical History: Past Medical History: Diagnosis Date  Cirrhosis ([place])   COPD (chronic obstructive pulmonary disease) ([place])   Gastric ulcer   Stroke [facility])  Past Surgical History: Past Surgical History: Procedure Laterality Date  IR PARACENTESIS  09/19/2022  IR PARACENTESIS  09/25/2022  IR PARACENTESIS  10/03/2022  IR PARACENTESIS  10/11/2022 [place] Siren 10/23/2022, 12:33 PM  IR Paracentesis  Result Date: 10/11/2022 INDICATION: Patient with a history of cirrhosis with recurrent ascites. Interventional radiology asked to perform a therapeutic paracentesis. EXAM: ULTRASOUND GUIDED PARACENTESIS MEDICATIONS: 1% lidocaine 10 mL COMPLICATIONS: None immediate. PROCEDURE: Informed written consent was obtained from the patient after a discussion of the risks, benefits and alternatives to treatment. A timeout was performed prior to the initiation of the procedure. Initial ultrasound scanning demonstrates a large amount of ascites within the right lower abdominal quadrant. The right lower abdomen was prepped and draped in the usual sterile fashion. 1% lidocaine was used for local anesthesia. Following this, a 19 gauge, 7-cm, Yueh catheter was introduced. An ultrasound image was saved for documentation purposes. The paracentesis was performed. The catheter was removed and a dressing was applied. The patient tolerated the procedure well without immediate post procedural complication. FINDINGS: A total of approximately 3 L of clear yellow fluid was removed. IMPRESSION: Successful ultrasound-guided paracentesis yielding 3 liters of peritoneal fluid. Read by: Soyla Dryer, NP Electronically Signed   By: Markus Daft M.D.   On: 10/11/2022 18:54   US Abdomen Complete  Result Date: 10/08/2022 CLINICAL DATA:  [id number] Ascites [id number] AKI (acute kidney injury) [facility]) [id number] Cirrhosis ([place]) [id number] EXAM: ABDOMEN ULTRASOUND COMPLETE COMPARISON:  July 01, 2022 October 12, 2018 FINDINGS: Gallbladder: No gallstones  visualized. Gallbladder is decompressed wall is diffusely thickened, consistent with fluid overload state. No sonographic Murphy sign noted by sonographer. Common bile duct: Diameter: Visualized portion measures 3 mm, within normal limits Liver: No focal lesion identified. Coarsened increased hepatic parenchymal echogenicity. Nodular liver contours. Portal vein is grossly patent on color Doppler imaging with normal direction of blood flow towards the liver. IVC: Not visualized secondary to shadowing bowel gas and ascites Pancreas: Not visualized secondary to shadowing bowel gas. Spleen: Size and appearance within normal limits. Right Kidney: Length: 8.0 cm. Echogenicity appears mildly increased. No mass or hydronephrosis visualized. Left Kidney: Length: 8.2 cm. Echogenicity appears mildly increased. No mass or hydronephrosis visualized. Abdominal aorta: Not visualized secondary to shadowing bowel gas. Other findings: Large volume ascites. IMPRESSION: 1. Cirrhotic liver morphology without focal lesion identified sonographically. 2. Large volume ascites. 3. Gallbladder wall thickening is consistent with fluid overload state. 4. Mildly increased echogenicity of the bilateral kidneys could reflect underlying medical renal disease versus artifact secondary to extensive ascites. No hydronephrosis. Electronically Signed   By: Valentino Saxon M.D.   On: 10/08/2022 15:17  IR Paracentesis  Result Date: 10/03/2022 INDICATION: Recurrent ascites secondary to cirrhosis. Request for therapeutic paracentesis. EXAM: ULTRASOUND GUIDED PARACENTESIS MEDICATIONS: 1% lidocaine 8 mL COMPLICATIONS: None immediate. PROCEDURE: Informed written consent was obtained from the patient after a discussion of the risks, benefits and alternatives to treatment. A timeout was performed prior to the initiation of the procedure. Initial ultrasound scanning demonstrates a large amount of ascites within the left lateral abdomen. The left lateral  abdomen was prepped and draped in the usual sterile fashion. 1% lidocaine was used for local anesthesia. Following this, a 19 gauge, 7-cm, Yueh catheter was introduced. An ultrasound image was saved for documentation purposes. The paracentesis was performed. The catheter was removed and a dressing was applied. The patient tolerated the procedure well without immediate post procedural complication. FINDINGS: A total of approximately 3.1 L of hazy yellow fluid was removed. IMPRESSION: Successful ultrasound-guided paracentesis yielding 3.1 liters of peritoneal fluid. Procedure performed by: Gareth Eagle, PA-C Electronically Signed   By: Ruthann Cancer M.D.   On: 10/03/2022 16:07     TODAY-DAY OF DISCHARGE:  Subjective:   Jacob Bass today has no headache,no chest abdominal pain,no new weakness tingling or numbness, feels much better wants to go home today.   Objective:   Blood pressure 123/71, pulse 91, temperature (!) 97.4 F (36.3 C), temperature source Oral, resp. rate 14, height '5\' 7"'$  (1.702 m), weight 58.5 kg, SpO2 100 %.  Intake/Output Summary (Last 24 hours) at 11/02/2022 1457 Last data filed at 11/02/2022 [id number] Gross per 24 hour  Intake 3565.2 ml  Output 700 ml  Net 2865.2 ml   Filed Weights   10/29/22 0354 10/29/22 0618  Weight: 58.5 kg 58.5 kg    Exam: Awake Alert, Oriented *3, No new F.N deficits, Normal affect [place].AT,PERRAL Supple Neck,No JVD, No cervical lymphadenopathy appriciated.  Symmetrical Chest wall movement, Good air movement bilaterally, CTAB RRR,No Gallops,Rubs or new Murmurs, No Parasternal Heave +ve B.Sounds, Abd Soft, Non tender, No organomegaly appriciated, No rebound -guarding or rigidity. No Cyanosis, Clubbing or edema, No new Rash or bruise   PERTINENT RADIOLOGIC STUDIES: CT ANGIO GI BLEED  Result Date: 10/31/2022 CLINICAL DATA:  Lower GI bleeding, rectal bleeding. EXAM: CTA ABDOMEN AND PELVIS WITHOUT AND WITH CONTRAST TECHNIQUE: Multidetector CT imaging of  the abdomen and pelvis was performed using the standard protocol during bolus administration of intravenous contrast. Multiplanar reconstructed images and MIPs were obtained and reviewed to evaluate the vascular anatomy. RADIATION DOSE REDUCTION: This exam was performed according to the departmental dose-optimization program which includes automated exposure control, adjustment of the mA and/or kV according to patient size and/or use of iterative reconstruction technique. CONTRAST:  91m OMNIPAQUE IOHEXOL 350 MG/ML SOLN COMPARISON:  CTA abdomen and pelvis 10/28/2022 FINDINGS: VASCULAR Aorta: Normal caliber aorta without aneurysm, dissection, vasculitis or significant stenosis. There are moderate atherosclerotic calcifications throughout the aorta, unchanged. Celiac: Patent without evidence of aneurysm, dissection, vasculitis or significant stenosis. SMA: Patent without evidence of aneurysm, dissection, vasculitis or significant stenosis. Renals: Both renal arteries are patent without evidence of aneurysm, dissection, vasculitis, fibromuscular dysplasia or significant stenosis. IMA: Patent without evidence of aneurysm, dissection, vasculitis or significant stenosis. Inflow: Patent without evidence of aneurysm, dissection, vasculitis or significant stenosis. Calcified atherosclerotic disease is present. Proximal Outflow: Calcified and noncalcified atherosclerotic disease again seen. There is stable mild-to-moderate narrowing of the bilateral superficial femoral arteries. Common femoral arteries are patent. No dissection or aneurysm. Veins: No obvious venous abnormality within the limitations of this arterial phase  study. Review of the MIP images confirms the above findings. NON-VASCULAR Lower chest: There is a small left pleural effusion with compressive atelectasis of the left lower lobe, unchanged. Hepatobiliary: Again seen is nodular liver contour compatible with cirrhosis. Gallbladder and bile ducts are within  normal limits. Pancreas: Unremarkable. No pancreatic ductal dilatation or surrounding inflammatory changes. Spleen: Normal in size without focal abnormality. Adrenals/Urinary Tract: Adrenal glands are unremarkable. Kidneys are normal, without renal calculi, focal lesion, or hydronephrosis. Bladder is unremarkable. Stomach/Bowel: There is no evidence for active arterial extravasation. There is no bowel obstruction, pneumatosis or free air. There is diffuse colonic wall thickening most significant in the rectosigmoid colon which is similar to prior study. The stomach is decompressed and gastric wall thickening is not excluded, also unchanged. Appendix is within normal limits. Lymphatic: No enlarged lymph nodes are seen. Reproductive: Prostate is unremarkable. Other: Large volume ascites is unchanged. Diffuse body wall edema is unchanged. There is a small umbilical hernia containing ascites. Musculoskeletal: No acute fractures are seen. Degenerative changes affect both hips, right greater than left. IMPRESSION: 1. No evidence for active arterial extravasation. 2. No evidence for aortic dissection or aneurysm. Aortic Atherosclerosis (ICD10-I70.0). NON-VASCULAR 1. No significant interval change in diffuse colonic wall thickening most significant in the rectosigmoid colon. Findings may be related to colitis or portal colopathy. 2. Stable large volume ascites and body wall edema. 3. Stable small left pleural effusion with compressive atelectasis of the left lower lobe. 4. Stable cirrhotic morphology of the liver. Electronically Signed   By: Ronney Asters M.D.   On: 10/31/2022 18:13     PERTINENT LAB RESULTS: CBC: Recent Labs    11/01/22 0651 11/02/22 0743  WBC 6.4 6.0  HGB 10.8* 9.8*  HCT 32.3* 29.7*  PLT 56* 51*   CMET CMP     Component Value Date/Time   NA 140 11/02/2022 0743   K 3.7 11/02/2022 0743   CL 109 11/02/2022 0743   CO2 22 11/02/2022 0743   GLUCOSE 89 11/02/2022 0743   BUN 20 11/02/2022  0743   CREATININE 1.32 (H) 11/02/2022 0743   CALCIUM 7.9 (L) 11/02/2022 0743   PROT 5.5 (L) 10/31/2022 0258   ALBUMIN 2.3 (L) 10/31/2022 0258   AST 47 (H) 10/31/2022 0258   ALT 27 10/31/2022 0258   ALKPHOS 76 10/31/2022 0258   BILITOT 0.8 10/31/2022 0258   GFRNONAA >60 11/02/2022 0743   GFRAA >60 10/27/2018 0537    GFR Estimated Creatinine Clearance: 47.4 mL/min (A) (by C-G formula based on SCr of 1.32 mg/dL (H)). No results for input(s): "LIPASE", "AMYLASE" in the last 72 hours. No results for input(s): "CKTOTAL", "CKMB", "CKMBINDEX", "TROPONINI" in the last 72 hours. Invalid input(s): "POCBNP" No results for input(s): "DDIMER" in the last 72 hours. No results for input(s): "HGBA1C" in the last 72 hours. No results for input(s): "CHOL", "HDL", "LDLCALC", "TRIG", "CHOLHDL", "LDLDIRECT" in the last 72 hours. No results for input(s): "TSH", "T4TOTAL", "T3FREE", "THYROIDAB" in the last 72 hours.  Invalid input(s): "FREET3" No results for input(s): "VITAMINB12", "FOLATE", "FERRITIN", "TIBC", "IRON", "RETICCTPCT" in the last 72 hours. Coags: No results for input(s): "INR" in the last 72 hours.  Invalid input(s): "PT" Microbiology: No results found for this or any previous visit (from the past 240 hour(s)).  FURTHER DISCHARGE INSTRUCTIONS:  Get Medicines reviewed and adjusted: Please take all your medications with you for your next visit with your Primary MD  Laboratory/radiological data: Please request your Primary MD to go over all  hospital tests and procedure/radiological results at the follow up, please ask your Primary MD to get all Hospital records sent to his/her office.  In some cases, they will be blood work, cultures and biopsy results pending at the time of your discharge. Please request that your primary care M.D. goes through all the records of your hospital data and follows up on these results.  Also Note the following: If you experience worsening of your admission  symptoms, develop shortness of breath, life threatening emergency, suicidal or homicidal thoughts you must seek medical attention immediately by calling 911 or calling your MD immediately  if symptoms less severe.  You must read complete instructions/literature along with all the possible adverse reactions/side effects for all the Medicines you take and that have been prescribed to you. Take any new Medicines after you have completely understood and accpet all the possible adverse reactions/side effects.   Do not drive when taking Pain medications or sleeping medications (Benzodaizepines)  Do not take more than prescribed Pain, Sleep and Anxiety Medications. It is not advisable to combine anxiety,sleep and pain medications without talking with your primary care practitioner  Special Instructions: If you have smoked or chewed Tobacco  in the last 2 yrs please stop smoking, stop any regular Alcohol  and or any Recreational drug use.  Wear Seat belts while driving.  Please note: You were cared for by a hospitalist during your hospital stay. Once you are discharged, your primary care physician will handle any further medical issues. Please note that NO REFILLS for any discharge medications will be authorized once you are discharged, as it is imperative that you return to your primary care physician (or establish a relationship with a primary care physician if you do not have one) for your post hospital discharge needs so that they can reassess your need for medications and monitor your lab values.  Total Time spent coordinating discharge including counseling, education and face to face time equals greater than 30 minutes.  SignedOren Binet 11/02/2022 2:57 PM

## 2022-11-02 NOTE — TOC Transition Note (Signed)
Transition of Care [facility]) - CM/SW Discharge Note   Patient Details  Name: Jacob Bass MRN: [id number] Date of Birth: 1961/05/21  [facility] CM/SW Contact:  Benard Halsted, LCSW Phone Number: 11/02/2022, 3:06 PM   Clinical Narrative:    Patient will DC to: [facility] Anticipated DC date: 11/02/22 Family notified: DSS Lavella Hammock, Left vms for Juanell Fairly Transport by: Corey Harold   Per MD patient ready for DC to [facility] RN to call report prior to discharge [id number] room 114A). RN, patient, patient's family, and facility notified of DC. Discharge Summary and FL2 sent to facility. DC packet on chart. Ambulance transport requested for patient.   CSW will sign off for now as social work intervention is no longer needed. Please consult [place] again if new needs arise.       Barriers to Discharge: Barriers Resolved   Patient Goals and CMS Choice CMS Medicare.gov Compare Post Acute Care list provided to:: Other (Comment Required) Choice offered to / list presented to : NA  Discharge Placement     Existing PASRR number confirmed : 11/02/22          [facility] Patient to be transferred to facility by: [place] Name of family member notified: Lavella Hammock, APS Patient and family notified of of transfer: 11/02/22  Discharge Plan and Services Additional resources added to the After Visit Summary for   In-house Referral: Clinical Social Work   Post Acute Care Choice: [place]                               Social Determinants of Health ([place]) Interventions SDOH Screenings   Food Insecurity: Unknown (10/29/2022)  Transportation Needs: Unknown (10/29/2022)  Utilities: Unknown (10/29/2022)  Tobacco Use: High Risk (10/30/2022)     Readmission Risk Interventions    10/30/2022    9:16 AM  Readmission Risk Prevention Plan  Transportation Screening Complete  Medication Review ([place]) Complete  PCP or  Specialist appointment within 3-5 days of discharge Complete  HRI or [place] Complete  SW Recovery Care/Counseling Consult Complete  Palliative Care Screening Not [place] Complete

## 2022-11-04 ENCOUNTER — Other Ambulatory Visit: Payer: Self-pay

## 2022-11-04 ENCOUNTER — Emergency Department (HOSPITAL_COMMUNITY)
Admission: EM | Admit: 2022-11-04 | Discharge: 2022-11-04 | Disposition: A | Discharge status: Home / Self Care | Payer: Medicaid Other | Attending: Emergency Medicine | Admitting: Emergency Medicine

## 2022-11-04 ENCOUNTER — Encounter (HOSPITAL_COMMUNITY): Payer: Self-pay

## 2022-11-04 DIAGNOSIS — F0390 Unspecified dementia without behavioral disturbance: Secondary | ICD-10-CM | POA: Diagnosis not present

## 2022-11-04 DIAGNOSIS — Z8673 Personal history of transient ischemic attack (TIA), and cerebral infarction without residual deficits: Secondary | ICD-10-CM | POA: Diagnosis not present

## 2022-11-04 DIAGNOSIS — K625 Hemorrhage of anus and rectum: Secondary | ICD-10-CM | POA: Insufficient documentation

## 2022-11-04 LAB — CBC WITH DIFFERENTIAL/PLATELET
Abs Immature Granulocytes: 0.03 10*3/uL (ref 0.00–0.07)
Basophils Absolute: 0 10*3/uL (ref 0.0–0.1)
Basophils Relative: 0 %
Eosinophils Absolute: 0.1 10*3/uL (ref 0.0–0.5)
Eosinophils Relative: 2 %
HCT: 33.7 % — ABNORMAL LOW (ref 39.0–52.0)
Hemoglobin: 10.8 g/dL — ABNORMAL LOW (ref 13.0–17.0)
Immature Granulocytes: 1 %
Lymphocytes Relative: 20 %
Lymphs Abs: 1.1 10*3/uL (ref 0.7–4.0)
MCH: 32.4 pg (ref 26.0–34.0)
MCHC: 32 g/dL (ref 30.0–36.0)
MCV: 101.2 fL — ABNORMAL HIGH (ref 80.0–100.0)
Monocytes Absolute: 0.8 10*3/uL (ref 0.1–1.0)
Monocytes Relative: 14 %
Neutro Abs: 3.5 10*3/uL (ref 1.7–7.7)
Neutrophils Relative %: 63 %
Platelets: 62 10*3/uL — ABNORMAL LOW (ref 150–400)
RBC: 3.33 MIL/uL — ABNORMAL LOW (ref 4.22–5.81)
RDW: 18.9 % — ABNORMAL HIGH (ref 11.5–15.5)
WBC: 5.5 10*3/uL (ref 4.0–10.5)
nRBC: 0 % (ref 0.0–0.2)

## 2022-11-04 LAB — COMPREHENSIVE METABOLIC PANEL
ALT: 42 U/L (ref 0–44)
AST: 83 U/L — ABNORMAL HIGH (ref 15–41)
Albumin: 2.6 g/dL — ABNORMAL LOW (ref 3.5–5.0)
Alkaline Phosphatase: 99 U/L (ref 38–126)
Anion gap: 8 (ref 5–15)
BUN: 19 mg/dL (ref 8–23)
CO2: 22 mmol/L (ref 22–32)
Calcium: 8.3 mg/dL — ABNORMAL LOW (ref 8.9–10.3)
Chloride: 111 mmol/L (ref 98–111)
Creatinine, Ser: 1.42 mg/dL — ABNORMAL HIGH (ref 0.61–1.24)
GFR, Estimated: 56 mL/min — ABNORMAL LOW (ref >60)
Glucose, Bld: 104 mg/dL — ABNORMAL HIGH (ref 70–99)
Potassium: 3.8 mmol/L (ref 3.5–5.1)
Sodium: 141 mmol/L (ref 135–145)
Total Bilirubin: 1.3 mg/dL — ABNORMAL HIGH (ref 0.3–1.2)
Total Protein: 6.2 g/dL — ABNORMAL LOW (ref 6.5–8.1)

## 2022-11-04 LAB — APTT: aPTT: 35 seconds (ref 24–36)

## 2022-11-04 LAB — PROTIME-INR
INR: 1.4 — ABNORMAL HIGH (ref 0.8–1.2)
Prothrombin Time: 16.7 seconds — ABNORMAL HIGH (ref 11.4–15.2)

## 2022-11-04 LAB — AMMONIA: Ammonia: 42 umol/L — ABNORMAL HIGH (ref 9–35)

## 2022-11-04 LAB — TYPE AND SCREEN
ABO/RH(D): O POS
Antibody Screen: NEGATIVE

## 2022-11-04 LAB — POC OCCULT BLOOD, ED: Fecal Occult Bld: POSITIVE — AB

## 2022-11-04 MED ORDER — PANTOPRAZOLE SODIUM 40 MG PO TBEC: 40.0000 mg | DELAYED_RELEASE_TABLET | Freq: Every day | ORAL | 0 refills | Status: DC

## 2022-11-04 MED ORDER — PANTOPRAZOLE SODIUM 40 MG IV SOLR
40.0000 mg | Freq: Once | INTRAVENOUS | Status: AC
  Administered 2022-11-04: 40 mg via INTRAVENOUS
  Filled 2022-11-04: qty 10

## 2022-11-04 MED ORDER — SODIUM CHLORIDE 0.9 % IV SOLN
2.0000 g | Freq: Once | INTRAVENOUS | Status: AC
  Administered 2022-11-04: 2 g via INTRAVENOUS
  Filled 2022-11-04: qty 20

## 2022-11-04 NOTE — ED Triage Notes (Signed)
Pt BIB EMS from [facility]. Per EMS, nurses @ SNF noticed clots in pt's stool. Seen other day for same. EMS noted some firmness to pt's abdomen. Dementia @ baseline.

## 2022-11-04 NOTE — ED Notes (Signed)
Pt discharged back to [facility] facility. Pt transported via [facility] at this time. All paperwork given to [facility]. VSS

## 2022-11-04 NOTE — ED Notes (Signed)
Called [facility] for pt pick up, NO ETA

## 2022-11-04 NOTE — Discharge Instructions (Addendum)
Your blood work was reassuring today.  You do not require any blood transfusion.  Continue to take your home medications as prescribed.  Follow-up with your GI specialist as needed for rectal bleeding.  Come back if any severe worsening bleeding, worsening blood pressure, worsening confusion, vomiting blood, or any other symptoms concerning to you.

## 2022-11-04 NOTE — ED Provider Notes (Signed)
Jacob Bass   CSN: [id number] Arrival date & time: 11/04/22  1342     History  No chief complaint on file.   Jacob Bass is a 62 y.o. male.  With PMH of liver cirrhosis, portal gastropathy, CVA, dementia and recently discharged 11/02/2022 for lower GI bleed brought in by EMS from nursing home after facility found some clots in his stool when changing diaper. No hematemesis.  He is at neurologic baseline.  There have been no reported other concerning symptoms.  Patient is demented at baseline.  Level 5 caveat due to dementia limiting ability provide history.  He has no complaints on exam.  Denying any abdominal pain, nausea, vomiting, diarrhea.  No pain on rectal exam.  HPI     Home Medications Prior to Admission medications   Medication Sig Start Date End Date Taking? Authorizing Provider  acetaminophen (TYLENOL) 500 MG tablet Take 1,000 mg by mouth every 8 (eight) hours as needed for moderate pain (severe pain).    [provider]  albuterol (VENTOLIN HFA) 108 (90 Base) MCG/ACT inhaler Inhale 2 puffs into the lungs every 4 (four) hours as needed for wheezing or shortness of breath.    [provider]  Calcium Carb-Cholecalciferol (CALCIUM + VITAMIN D3) 600-5 MG-MCG TABS Take 1 tablet by mouth daily at 12 noon. (1200)    [provider]  ciprofloxacin (CIPRO) 500 MG tablet Take 1 tablet (500 mg total) by mouth daily at 8 pm. 11/02/22   [name], Henreitta Leber, MD  cyanocobalamin (VITAMIN B12) 1000 MCG tablet Take 1,000 mcg by mouth in the morning. (0900)    [provider]  feeding supplement, ENSURE ENLIVE, (ENSURE ENLIVE) LIQD Take 237 mLs by mouth daily at 3 pm. 10/28/18   Florencia Reasons, MD  fluticasone-salmeterol (ADVAIR) 250-50 MCG/ACT AEPB Inhale 1 puff into the lungs in the morning and at bedtime.    [provider]  folic acid (FOLVITE) 1 MG tablet Take 1 tablet (1 mg total) by mouth daily.  10/28/18   Florencia Reasons, MD  furosemide (LASIX) 20 MG tablet Take 1 tablet (20 mg total) by mouth daily. 11/02/22   [name], Henreitta Leber, MD  ipratropium-albuterol (DUONEB) 0.5-2.5 (3) MG/3ML SOLN Take 3 mLs by nebulization every 4 (four) hours as needed. 10/27/18   Florencia Reasons, MD  lactulose (CHRONULAC) 10 GM/15ML solution Take 15 mLs (10 g total) by mouth 2 (two) times daily. 10/26/22   Little Ishikawa, MD  latanoprost (XALATAN) 0.005 % ophthalmic solution Place 1 drop into both eyes at bedtime. (0900)    [provider]  loperamide (IMODIUM A-D) 2 MG tablet Take 4 mg by mouth every 6 (six) hours as needed for diarrhea or loose stools.    [provider]  magnesium oxide (MAG-OX) 400 (240 Mg) MG tablet Take 400 mg by mouth 3 (three) times daily.    [provider]  melatonin 5 MG TABS Take 5 mg by mouth at bedtime.    [provider]  Nutritional Supplements (NUTRITIONAL DRINK) LIQD Take 120 mLs by mouth 3 (three) times daily. House supplement (0900, 1400, 2100)    [provider]  Nystatin (GERHARDT'S BUTT CREAM) CREA Apply 1 Application topically 4 (four) times daily as needed for irritation. 10/26/22   Little Ishikawa, MD  omeprazole (PRILOSEC OTC) 20 MG tablet Take 40 mg by mouth 2 (two) times daily.    [provider]  senna-docusate (SENOKOT-S) 8.6-50  MG tablet Take 1 tablet by mouth at bedtime as needed for mild constipation or moderate constipation. 10/26/22   Little Ishikawa, MD  tamsulosin (FLOMAX) 0.4 MG CAPS capsule Take 0.4 mg by mouth in the morning. (0900)    [provider]  tiotropium (SPIRIVA) 18 MCG inhalation capsule Place 18 mcg into inhaler and inhale daily.    [provider]  Zinc Oxide 10 % OINT Apply 1 Application topically every 6 (six) hours as needed (To buttocks for irritation).    [provider]      Allergies    Penicillins and Penicillins    Review of Systems   Review of  Systems  Physical Exam Updated Vital Signs BP (!) 126/59   Pulse 90   Temp 98.4 F (36.9 C) (Oral)   Resp 18   SpO2 100%  Physical Exam Constitutional: Alert and oriented to self and place.  No acute distress chronically ill-appearing. Eyes: Conjunctivae are normal. ENT      Head: Normocephalic and atraumatic. Cardiovascular: S1, S2,  Normal and symmetric distal pulses are present in all extremities.Warm and well perfused. Respiratory: Normal respiratory effort. Breath sounds are normal. Gastrointestinal: Abdomen distended but soft and nontender.  Rectal exam performed with bedside RN, minimal bloody mucus mixed with stool on exam. Musculoskeletal: No pitting edema of lower extremities Neurologic: Normal speech and language.  Skin: Skin is warm, dry and intact. No rash noted. Psychiatric: Mood and affect are normal. Speech and behavior are normal.  ED Results / Procedures / Treatments   Labs (all labs ordered are listed, but only abnormal results are displayed) Labs Reviewed  COMPREHENSIVE METABOLIC PANEL - Abnormal; Notable for the following components:      Result Value   Glucose, Bld 104 (*)    Creatinine, Ser 1.42 (*)    Calcium 8.3 (*)    Total Protein 6.2 (*)    Albumin 2.6 (*)    AST 83 (*)    Total Bilirubin 1.3 (*)    GFR, Estimated 56 (*)    All other components within normal limits  CBC WITH DIFFERENTIAL/PLATELET - Abnormal; Notable for the following components:   RBC 3.33 (*)    Hemoglobin 10.8 (*)    HCT 33.7 (*)    MCV 101.2 (*)    RDW 18.9 (*)    Platelets 62 (*)    All other components within normal limits  PROTIME-INR - Abnormal; Notable for the following components:   Prothrombin Time 16.7 (*)    INR 1.4 (*)    All other components within normal limits  AMMONIA - Abnormal; Notable for the following components:   Ammonia 42 (*)    All other components within normal limits  POC OCCULT BLOOD, ED - Abnormal; Notable for the following components:    Fecal Occult Bld POSITIVE (*)    All other components within normal limits  APTT  TYPE AND SCREEN    EKG None  Radiology No results found.  Procedures Procedures  Remain on constant cardiac monitoring sinus rhythm with normal rates.  Medications Ordered in ED Medications  cefTRIAXone (ROCEPHIN) 2 g in sodium chloride 0.9 % 100 mL IVPB (0 g Intravenous Stopped 11/04/22 1509)  pantoprazole (PROTONIX) injection 40 mg (40 mg Intravenous Given 11/04/22 1426)    ED Course/ Medical Decision Making/ A&P   {  Medical Decision Making Kiren Schetter is a 62 y.o. male.  With PMH of liver cirrhosis, portal gastropathy, CVA, dementia and recently discharged 11/02/2022 for lower GI bleed brought in by EMS from nursing home after facility found some clots in his stool when changing diaper. No hematemesis.  He is at neurologic baseline.  There have been no reported other concerning symptoms.  Patient presents with rectal bleeding which appears to been chronic in nature.  Suspected to be hemorrhoidal.  Poor endoscopy candidate due to dementia and unable to bowel prep.  Unlikely variceal as patient is very hemodynamically stable in no acute distress, at neurologic baseline with no abdominal pain and no hematemesis.  Today, his blood levels are stable and reassuring.  Hemoglobin is 10.8 and platelets 62 consistent with his baseline.  INR slightly elevated 1.4.  All consistent with his known underlying cirrhosis.  No worsening of blood levels.  No need for transfusion.  Rectal bleeding on exam was very minimal.  S/w Dr Carol Ada of [facility], he reviewed his case and says they would offer no therapies at this time as this appears to be his baseline and there is no acute drop in his hemoglobin or findings suggestive of hemodynamic instability.  Patient given dose of IV Protonix and Rocephin here.  Remains hemodynamically stable.  Currently on omeprazole daily.  Was  also discharged on ciprofloxacin from previous discharge.  Safe to be discharged back to his facility with return precautions.  Amount and/or Complexity of Data Reviewed Labs: ordered.  Risk Prescription drug management.     Final Clinical Impression(s) / ED Diagnoses Final diagnoses:  Bright red blood per rectum    Rx / DC Orders ED Discharge Orders          Ordered    pantoprazole (PROTONIX) 40 MG tablet  Daily,   Status:  Discontinued        11/04/22 1542              Elgie Congo, MD 11/04/22 1547

## 2022-11-05 ENCOUNTER — Emergency Department (HOSPITAL_COMMUNITY)
Admission: EM | Admit: 2022-11-05 | Discharge: 2022-11-05 | Disposition: A | Discharge status: Skilled Nursing Facility | Payer: Medicaid Other | Attending: Pediatrics | Admitting: Pediatrics

## 2022-11-05 ENCOUNTER — Other Ambulatory Visit: Payer: Self-pay

## 2022-11-05 DIAGNOSIS — R791 Abnormal coagulation profile: Secondary | ICD-10-CM | POA: Diagnosis not present

## 2022-11-05 DIAGNOSIS — K625 Hemorrhage of anus and rectum: Secondary | ICD-10-CM

## 2022-11-05 DIAGNOSIS — F0390 Unspecified dementia without behavioral disturbance: Secondary | ICD-10-CM | POA: Diagnosis not present

## 2022-11-05 LAB — CBC WITH DIFFERENTIAL/PLATELET
Abs Immature Granulocytes: 0.03 10*3/uL (ref 0.00–0.07)
Basophils Absolute: 0 10*3/uL (ref 0.0–0.1)
Basophils Relative: 1 %
Eosinophils Absolute: 0.2 10*3/uL (ref 0.0–0.5)
Eosinophils Relative: 3 %
HCT: 30.2 % — ABNORMAL LOW (ref 39.0–52.0)
Hemoglobin: 9.7 g/dL — ABNORMAL LOW (ref 13.0–17.0)
Immature Granulocytes: 0 %
Lymphocytes Relative: 21 %
Lymphs Abs: 1.5 10*3/uL (ref 0.7–4.0)
MCH: 33.3 pg (ref 26.0–34.0)
MCHC: 32.1 g/dL (ref 30.0–36.0)
MCV: 103.8 fL — ABNORMAL HIGH (ref 80.0–100.0)
Monocytes Absolute: 1.1 10*3/uL — ABNORMAL HIGH (ref 0.1–1.0)
Monocytes Relative: 16 %
Neutro Abs: 4.1 10*3/uL (ref 1.7–7.7)
Neutrophils Relative %: 59 %
Platelets: 61 10*3/uL — ABNORMAL LOW (ref 150–400)
RBC: 2.91 MIL/uL — ABNORMAL LOW (ref 4.22–5.81)
RDW: 18.9 % — ABNORMAL HIGH (ref 11.5–15.5)
WBC: 6.9 10*3/uL (ref 4.0–10.5)
nRBC: 0 % (ref 0.0–0.2)

## 2022-11-05 LAB — PROTIME-INR
INR: 1.4 — ABNORMAL HIGH (ref 0.8–1.2)
Prothrombin Time: 17.5 seconds — ABNORMAL HIGH (ref 11.4–15.2)

## 2022-11-05 LAB — BASIC METABOLIC PANEL
Anion gap: 8 (ref 5–15)
BUN: 19 mg/dL (ref 8–23)
CO2: 22 mmol/L (ref 22–32)
Calcium: 8.2 mg/dL — ABNORMAL LOW (ref 8.9–10.3)
Chloride: 111 mmol/L (ref 98–111)
Creatinine, Ser: 1.49 mg/dL — ABNORMAL HIGH (ref 0.61–1.24)
GFR, Estimated: 52 mL/min — ABNORMAL LOW (ref >60)
Glucose, Bld: 90 mg/dL (ref 70–99)
Potassium: 4.1 mmol/L (ref 3.5–5.1)
Sodium: 141 mmol/L (ref 135–145)

## 2022-11-05 LAB — APTT: aPTT: 34 seconds (ref 24–36)

## 2022-11-05 NOTE — ED Notes (Signed)
Attempted to Call Nurse report to [place], voice mail left, no answer of phone call, attempted 3 time.Discharge paperwork to be sent with patient back to facility.

## 2022-11-05 NOTE — Discharge Instructions (Addendum)
Your blood work was reassuring today. You do not require any blood transfusion. Continue to take your home medications as prescribed. Follow-up with your GI specialist as needed for rectal bleeding. Come back if any severe worsening bleeding, worsening blood pressure, worsening confusion, vomiting blood, or any other symptoms concerning to you.

## 2022-11-05 NOTE — ED Provider Notes (Signed)
[place] Provider Note   CSN: [id number] Arrival date & time: 11/05/22  1524     History  Chief Complaint  Patient presents with   Rectal Bleeding    Jacob Bass is a 62 y.o. male.  Patient presents the emergency department via EMS.  Patient with past medical history significant for liver cirrhosis, portal gastropathy, CVA, dementia.  Patient discharged on November 02, 2022 due to lower GI bleed.  Patient was evaluated yesterday and now again today due to clots found in his stool when changing diaper.  Patient has no complaints on exam.  Patient denies any abdominal pain, nausea, vomiting, diarrhea.  Patient is level 5 caveat due to dementia limiting ability to provide accurate history.  Patient with dementia at baseline  HPI     Home Medications Prior to Admission medications   Medication Sig Start Date End Date Taking? Authorizing Provider  acetaminophen (TYLENOL) 500 MG tablet Take 1,000 mg by mouth every 8 (eight) hours as needed for moderate pain (severe pain).    [provider]  albuterol (VENTOLIN HFA) 108 (90 Base) MCG/ACT inhaler Inhale 2 puffs into the lungs every 4 (four) hours as needed for wheezing or shortness of breath.    [provider]  Calcium Carb-Cholecalciferol (CALCIUM + VITAMIN D3) 600-5 MG-MCG TABS Take 1 tablet by mouth daily at 12 noon. (1200)    [provider]  ciprofloxacin (CIPRO) 500 MG tablet Take 1 tablet (500 mg total) by mouth daily at 8 pm. 11/02/22   [name], Henreitta Leber, MD  cyanocobalamin (VITAMIN B12) 1000 MCG tablet Take 1,000 mcg by mouth in the morning. (0900)    [provider]  feeding supplement, ENSURE ENLIVE, (ENSURE ENLIVE) LIQD Take 237 mLs by mouth daily at 3 pm. 10/28/18   Florencia Reasons, MD  fluticasone-salmeterol (ADVAIR) 250-50 MCG/ACT AEPB Inhale 1 puff into the lungs in the morning and at bedtime.    [provider]  folic acid (FOLVITE) 1 MG tablet  Take 1 tablet (1 mg total) by mouth daily. 10/28/18   Florencia Reasons, MD  furosemide (LASIX) 20 MG tablet Take 1 tablet (20 mg total) by mouth daily. 11/02/22   [name], Henreitta Leber, MD  ipratropium-albuterol (DUONEB) 0.5-2.5 (3) MG/3ML SOLN Take 3 mLs by nebulization every 4 (four) hours as needed. 10/27/18   Florencia Reasons, MD  lactulose (CHRONULAC) 10 GM/15ML solution Take 15 mLs (10 g total) by mouth 2 (two) times daily. 10/26/22   Little Ishikawa, MD  latanoprost (XALATAN) 0.005 % ophthalmic solution Place 1 drop into both eyes at bedtime. (0900)    [provider]  loperamide (IMODIUM A-D) 2 MG tablet Take 4 mg by mouth every 6 (six) hours as needed for diarrhea or loose stools.    [provider]  magnesium oxide (MAG-OX) 400 (240 Mg) MG tablet Take 400 mg by mouth 3 (three) times daily.    [provider]  melatonin 5 MG TABS Take 5 mg by mouth at bedtime.    [provider]  Nutritional Supplements (NUTRITIONAL DRINK) LIQD Take 120 mLs by mouth 3 (three) times daily. House supplement (0900, 1400, 2100)    [provider]  Nystatin (GERHARDT'S BUTT CREAM) CREA Apply 1 Application topically 4 (four) times daily as needed for irritation. 10/26/22   Little Ishikawa, MD  omeprazole (PRILOSEC OTC) 20 MG tablet Take 40 mg by mouth 2 (two) times daily.    [provider]  senna-docusate (SENOKOT-S) 8.6-50 MG tablet Take 1 tablet by mouth at bedtime as needed for mild constipation or moderate constipation. 10/26/22   Little Ishikawa, MD  tamsulosin (FLOMAX) 0.4 MG CAPS capsule Take 0.4 mg by mouth in the morning. (0900)    [provider]  tiotropium (SPIRIVA) 18 MCG inhalation capsule Place 18 mcg into inhaler and inhale daily.    [provider]  Zinc Oxide 10 % OINT Apply 1 Application topically every 6 (six) hours as needed (To buttocks for irritation).    [provider]      Allergies    Penicillins and Penicillins     Review of Systems   Review of Systems  Gastrointestinal:  Positive for blood in stool.    Physical Exam Updated Vital Signs BP 123/76 (BP Location: Right Arm)   Pulse 90   Temp 98.2 F (36.8 C) (Oral)   Resp 18   Ht '5\' 7"'$  (1.702 m)   Wt 58.5 kg   BMI 20.20 kg/m  Physical Exam Vitals and nursing note reviewed. Exam conducted with a chaperone present.  Constitutional:      General: He is not in acute distress. HENT:     Head: Normocephalic and atraumatic.     Mouth/Throat:     Mouth: Mucous membranes are moist.  Eyes:     Conjunctiva/sclera: Conjunctivae normal.     Comments: Subconjunctival hemorrhage noted in lateral right eye  Cardiovascular:     Rate and Rhythm: Normal rate and regular rhythm.  Pulmonary:     Effort: Pulmonary effort is normal. No respiratory distress.     Breath sounds: Normal breath sounds.  Abdominal:     Palpations: Abdomen is soft.     Tenderness: There is no abdominal tenderness.  Genitourinary:    Comments: Bloody mucus in stool mixed in patient's diaper Musculoskeletal:        General: No signs of injury.     Cervical back: Normal range of motion.  Skin:    General: Skin is dry.  Neurological:     Mental Status: He is alert.  Psychiatric:        Speech: Speech normal.        Behavior: Behavior normal.     ED Results / Procedures / Treatments   Labs (all labs ordered are listed, but only abnormal results are displayed) Labs Reviewed  BASIC METABOLIC PANEL - Abnormal; Notable for the following components:      Result Value   Creatinine, Ser 1.49 (*)    Calcium 8.2 (*)    GFR, Estimated 52 (*)    All other components within normal limits  CBC WITH DIFFERENTIAL/PLATELET - Abnormal; Notable for the following components:   RBC 2.91 (*)    Hemoglobin 9.7 (*)    HCT 30.2 (*)    MCV 103.8 (*)    RDW 18.9 (*)    Platelets 61 (*)    Monocytes Absolute 1.1 (*)    All other components within normal limits  PROTIME-INR - Abnormal;  Notable for the following components:   Prothrombin Time 17.5 (*)    INR 1.4 (*)    All other components within normal limits  APTT    EKG EKG Interpretation  Date/Time:  Monday November 05 2022 15:50:04 EST Ventricular Rate:  89 PR Interval:  115 QRS Duration: 83 QT Interval:  376 QTC Calculation: 458 R Axis:   66 Text Interpretation: Sinus rhythm Borderline short PR interval Anteroseptal infarct,  old Borderline T abnormalities, inferior leads Confirmed by Georgina Snell [phone]) on 11/05/2022 4:30:53 PM  Radiology No results found.  Procedures Procedures    Medications Ordered in ED Medications - No data to display  ED Course/ Medical Decision Making/ A&P Clinical Course as of 11/05/22 1816  Mon Nov 05, 2022  1815 This is a chronically ill male with history of cirrhosis who has had previous visits for rectal bleeding.  I just seen this patient yesterday and he is the same today as he was yesterday.  He is hemodynamically stable with minimal bleeding on exam.  Hemoglobin has dropped 1 point but no significant change from baseline.  9.7 today.  Platelets 61 and stable.  No significant bleeding I had spoken to GI yesterday who had noted that there are not many options for treatment for this patient and unless there is significant heavy bleeding or significant drops they would plan no active intervention.  Feel there is been no significant drop or change in blood levels.  His INR is at baseline.  He is safe to be discharged with outpatient follow-up. [VB]    Clinical Course User Index [VB] Elgie Congo, MD                             Medical Decision Making Amount and/or Complexity of Data Reviewed Labs: ordered.   Patient presents with chief complaint of rectal bleeding.  This appears to be chronic in nature.  GI reports the patient is poor endoscopy candidate due to dementia and unable to bowel prep.  Patient is hemodynamically stable and in no acute distress,  neurologic baseline, no abdominal pain, no hematemesis.  Doubt variceal bleed.  Suspect this could be hemorrhoidal in nature.  Today his hemoglobin is once again reassuring at 9.7.  Over the past few days hemoglobin has varied in levels from 10.8-9.8.  PT/INR also appears consistent with baseline.  I reviewed the patient's notes from yesterday.  Dr.Branham spoke with Dr. Benson Norway for [facility] gastroenterology yesterday.  At that time he recommended no therapies due to patient having what appeared to be baseline lab values.  At this time I see no emergent cause of the patient's rectal bleed.  Patient's hemoglobin appears stable.  Vitals are normal.  Plan to discharge to his facility with return precautions.       Final Clinical Impression(s) / ED Diagnoses Final diagnoses:  Rectal bleeding    Rx / DC Orders ED Discharge Orders     None         Ronny Bacon 11/05/22 1816    Elgie Congo, MD 11/05/22 2253

## 2022-11-05 NOTE — ED Notes (Signed)
Transfer of care report given to Helene Kelp, LPN at [facility].

## 2022-11-05 NOTE — ED Notes (Signed)
Secretary requesting [facility] transport back to [facility].

## 2022-11-05 NOTE — ED Notes (Signed)
[facility] was called, 6 on list

## 2022-11-05 NOTE — ED Triage Notes (Signed)
PT BIB from [facility] for recurrent rectal bleeding- clots and bright red blood. Pt was hospitalized for same reason last week, sent to ED on 11/04/2022 and back again today. Pt is A&Ox4, ABCs intact, NAD noted at this time.  EMS V/S 148/70 HR92 97% RA 20 RR

## 2022-11-06 ENCOUNTER — Telehealth: Payer: Self-pay | Admitting: Gastroenterology

## 2022-11-06 NOTE — Telephone Encounter (Signed)
Spoke with [facility] @ [address] and confirmed appt.

## 2022-11-06 NOTE — Telephone Encounter (Signed)
Jacob Bass, I have scheduled patient for a new patient appt with Ellouise Newer, PA-C on Wednesday, 11/14/22 at 8:30 am. Please notify patient of the appointment. Thank you

## 2022-11-06 NOTE — Telephone Encounter (Signed)
We received an urgent referral in [place] for patient to be seen for a hospital f/u for a GI bleed.  Please review and advise scheduling.  Thanks

## 2022-11-14 ENCOUNTER — Other Ambulatory Visit (INDEPENDENT_AMBULATORY_CARE_PROVIDER_SITE_OTHER): Payer: Medicaid Other

## 2022-11-14 ENCOUNTER — Encounter: Payer: Self-pay | Admitting: Physician Assistant

## 2022-11-14 ENCOUNTER — Ambulatory Visit (INDEPENDENT_AMBULATORY_CARE_PROVIDER_SITE_OTHER): Payer: Medicaid Other | Admitting: Physician Assistant

## 2022-11-14 VITALS — BP 124/62 | HR 52 | Ht 67.0 in

## 2022-11-14 DIAGNOSIS — K7460 Unspecified cirrhosis of liver: Secondary | ICD-10-CM | POA: Diagnosis not present

## 2022-11-14 DIAGNOSIS — D649 Anemia, unspecified: Secondary | ICD-10-CM

## 2022-11-14 DIAGNOSIS — K7290 Hepatic failure, unspecified without coma: Secondary | ICD-10-CM

## 2022-11-14 LAB — CBC WITH DIFFERENTIAL/PLATELET
Basophils Absolute: 0.1 10*3/uL (ref 0.0–0.1)
Basophils Relative: 1.2 % (ref 0.0–3.0)
Eosinophils Absolute: 0.1 10*3/uL (ref 0.0–0.7)
Eosinophils Relative: 2 % (ref 0.0–5.0)
HCT: 31.7 % — ABNORMAL LOW (ref 39.0–52.0)
Hemoglobin: 10.7 g/dL — ABNORMAL LOW (ref 13.0–17.0)
Lymphocytes Relative: 26.5 % (ref 12.0–46.0)
Lymphs Abs: 1.1 10*3/uL (ref 0.7–4.0)
MCHC: 33.8 g/dL (ref 30.0–36.0)
MCV: 99.5 fl (ref 78.0–100.0)
Monocytes Absolute: 0.5 10*3/uL (ref 0.1–1.0)
Monocytes Relative: 11.9 % (ref 3.0–12.0)
Neutro Abs: 2.5 10*3/uL (ref 1.4–7.7)
Neutrophils Relative %: 58.4 % (ref 43.0–77.0)
Platelets: 75 10*3/uL — ABNORMAL LOW (ref 150.0–400.0)
RBC: 3.19 Mil/uL — ABNORMAL LOW (ref 4.22–5.81)
RDW: 19.2 % — ABNORMAL HIGH (ref 11.5–15.5)
WBC: 4.2 10*3/uL (ref 4.0–10.5)

## 2022-11-14 LAB — COMPREHENSIVE METABOLIC PANEL
ALT: 31 U/L (ref 0–53)
AST: 57 U/L — ABNORMAL HIGH (ref 0–37)
Albumin: 2.3 g/dL — ABNORMAL LOW (ref 3.5–5.2)
Alkaline Phosphatase: 105 U/L (ref 39–117)
BUN: 15 mg/dL (ref 6–23)
CO2: 24 mEq/L (ref 19–32)
Calcium: 7.9 mg/dL — ABNORMAL LOW (ref 8.4–10.5)
Chloride: 108 mEq/L (ref 96–112)
Creatinine, Ser: 1.09 mg/dL (ref 0.40–1.50)
GFR: 72.05 mL/min (ref >60.00)
Glucose, Bld: 116 mg/dL — ABNORMAL HIGH (ref 70–99)
Potassium: 4.1 mEq/L (ref 3.5–5.1)
Sodium: 137 mEq/L (ref 135–145)
Total Bilirubin: 1 mg/dL (ref 0.2–1.2)
Total Protein: 6.1 g/dL (ref 6.0–8.3)

## 2022-11-14 LAB — PROTIME-INR
INR: 1.6 ratio — ABNORMAL HIGH (ref 0.8–1.0)
Prothrombin Time: 16.7 s — ABNORMAL HIGH (ref 9.6–13.1)

## 2022-11-14 LAB — AMMONIA: Ammonia: 44 umol/L — ABNORMAL HIGH (ref 11–35)

## 2022-11-14 NOTE — Patient Instructions (Addendum)
Your provider has requested that you go to the basement level for lab work before leaving today. Press "B" on the elevator. The lab is located at the first door on the left as you exit the elevator. '  Continue current medications.   Due to recent changes in healthcare laws, you may see the results of your imaging and laboratory studies on MyChart before your provider has had a chance to review them.  We understand that in some cases there may be results that are confusing or concerning to you. Not all laboratory results come back in the same time frame and the provider may be waiting for multiple results in order to interpret others.  Please give [place] 48 hours in order for your provider to thoroughly review all the results before contacting the office for clarification of your results.   It was a pleasure to see you today!  Thank you for trusting me with your gastrointestinal care!

## 2022-11-14 NOTE — Progress Notes (Signed)
Chief Complaint: History of GI Bleed and decompensated cirrhosis  HPI:    Jacob Bass is a  62 y/o male with a complicated past medical history with significant comorbidities as listed below including cirrhosis, stroke, CHF and multiple others, known to Dr. Silverio Decamp from an admission to the hospital, who was referred to me by Tyrone Schimke., MD for follow-up of history of GI bleed and decompensated cirrhosis.    10/2018 EGD for anemia with a gastric ulcer clean-based with surrounding area showing some polypoid mucosa, biopsy showed reactive gastropathy with no H. pylori.  Colonoscopy been planned in November 2023 but not done.    10/29/2022-11/02/2022 patient followed in the hospital by our service for red blood per rectum.  Please see consult note 10/29/2022 for all the details.  At time of admission hemoglobin 9.1 which is stable from discharge.  Hemoglobin had dropped to 8.5.  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  Time of that admission not currently manifesting any large amount of GI bleeding and hemoglobin is relatively stable.  CT angiography showed diffuse wall thickening of most of the colon and rectum in the stomach which is thought secondary to underlying liver disease and anasarca.  Also noted neurogenic dysphagia secondary to CVA per speech pathology.  At that time discussed possible flex sig and EGD that admission if possible.    11/02/2022 patient was seen last by our service for his decompensated cirrhosis and intermittent hematochezia.  At that time no abdominal pain and no further bleeding over the past 48 hours, hemoglobin 9.8.  He did not have POA or guardianship and endoscopic evaluation was nonemergent and so not pursued.  He did end up having a paracentesis x 1 on admission for ascites.  He had not been on diuretics due to acute  kidney injury on chronic kidney disease.  At that time continued on Lactulose 10 g twice a day, Cipro 500 once daily for SBP prophylaxis and twice daily PPI.  Bleeding was thought likely hemorrhoidal.    11/05/2022 hemoglobin 9.7 (10.8 on 11/04/2022).  BMP at that time with a normal BUN and a creatinine of 1.49 which had improved.  PT/INR 1.4.    Today, patient presents to clinic accompanied by one of the nurses from his living facility.  Unfortunately they do not really know his history.  Patient has had a stroke and does have trouble talking.  He denies seeing any further blood in his stool and nursing staff is unaware of any.  He is not sure how many bowel movements he has a day, maybe once even though he is on Lactulose.  He does not feel uncomfortable in his abdomen and it does not feel extremely distended per him.  Currently he actually feels okay.    Denies fever, chills, abdominal pain or symptoms that awaken him from sleep.  Past Medical History:  Diagnosis Date   CHF (congestive heart failure) (HCC)    Cirrhosis (HCC)    COPD (chronic obstructive pulmonary disease) (HCC)    ETOH abuse    Gastric ulcer    Hypertension    Stroke [facility])     Past Surgical History:  Procedure Laterality Date   BIOPSY  10/19/2018   Procedure: BIOPSY;  Surgeon: Yetta Flock, MD;  Location: [facility] ENDOSCOPY;  Service: Gastroenterology;;   ESOPHAGOGASTRODUODENOSCOPY (EGD) WITH  PROPOFOL N/A 10/19/2018   Procedure: ESOPHAGOGASTRODUODENOSCOPY (EGD);  Surgeon: Yetta Flock, MD;  Location: [facility] ENDOSCOPY;  Service: Gastroenterology;  Laterality: N/A;   IR PARACENTESIS  09/19/2022   IR PARACENTESIS  09/25/2022   IR PARACENTESIS  10/03/2022   IR PARACENTESIS  10/11/2022   IR PARACENTESIS  10/24/2022   IR PARACENTESIS  10/30/2022    Current Outpatient Medications  Medication Sig Dispense Refill   acetaminophen (TYLENOL) 500 MG tablet Take 1,000 mg by mouth every 8 (eight) hours as needed for moderate pain  (severe pain).     albuterol (VENTOLIN HFA) 108 (90 Base) MCG/ACT inhaler Inhale 2 puffs into the lungs every 4 (four) hours as needed for wheezing or shortness of breath.     Calcium Carb-Cholecalciferol (CALCIUM + VITAMIN D3) 600-5 MG-MCG TABS Take 1 tablet by mouth daily at 12 noon. (1200)     ciprofloxacin (CIPRO) 500 MG tablet Take 1 tablet (500 mg total) by mouth daily at 8 pm. 28 tablet 0   cyanocobalamin (VITAMIN B12) 1000 MCG tablet Take 1,000 mcg by mouth in the morning. (0900)     feeding supplement, ENSURE ENLIVE, (ENSURE ENLIVE) LIQD Take 237 mLs by mouth daily at 3 pm. 237 mL 12   fluticasone-salmeterol (ADVAIR) 250-50 MCG/ACT AEPB Inhale 1 puff into the lungs in the morning and at bedtime.     folic acid (FOLVITE) 1 MG tablet Take 1 tablet (1 mg total) by mouth daily.     furosemide (LASIX) 20 MG tablet Take 1 tablet (20 mg total) by mouth daily. 3 tablet 0   ipratropium-albuterol (DUONEB) 0.5-2.5 (3) MG/3ML SOLN Take 3 mLs by nebulization every 4 (four) hours as needed. 360 mL    lactulose (CHRONULAC) 10 GM/15ML solution Take 15 mLs (10 g total) by mouth 2 (two) times daily. 236 mL 0   latanoprost (XALATAN) 0.005 % ophthalmic solution Place 1 drop into both eyes at bedtime. (0900)     loperamide (IMODIUM A-D) 2 MG tablet Take 4 mg by mouth every 6 (six) hours as needed for diarrhea or loose stools.     magnesium oxide (MAG-OX) 400 (240 Mg) MG tablet Take 400 mg by mouth 3 (three) times daily.     melatonin 5 MG TABS Take 5 mg by mouth at bedtime.     Nutritional Supplements (NUTRITIONAL DRINK) LIQD Take 120 mLs by mouth 3 (three) times daily. House supplement (0900, 1400, 2100)     Nystatin (GERHARDT'S BUTT CREAM) CREA Apply 1 Application topically 4 (four) times daily as needed for irritation. 1 each 1   omeprazole (PRILOSEC OTC) 20 MG tablet Take 40 mg by mouth 2 (two) times daily.     senna-docusate (SENOKOT-S) 8.6-50 MG tablet Take 1 tablet by mouth at bedtime as needed for mild  constipation or moderate constipation. 30 tablet 1   tamsulosin (FLOMAX) 0.4 MG CAPS capsule Take 0.4 mg by mouth in the morning. (0900)     tiotropium (SPIRIVA) 18 MCG inhalation capsule Place 18 mcg into inhaler and inhale daily.     Zinc Oxide 10 % OINT Apply 1 Application topically every 6 (six) hours as needed (To buttocks for irritation).     No current facility-administered medications for this visit.    Allergies as of 11/14/2022 - Review Complete 11/14/2022  Allergen Reaction Noted   Penicillins Hives 10/09/2018   Penicillins Other (See Comments) 09/10/2022    History reviewed. No pertinent family history.  Social History   Socioeconomic History  Marital status: Single    Spouse name: Not on file   Number of children: Not on file   Years of education: Not on file   Highest education level: Not on file  Occupational History   Not on file  Tobacco Use   Smoking status: Every Day    Packs/day: 2.00    Types: Cigarettes   Smokeless tobacco: Never  Substance and Sexual Activity   Alcohol use: Yes    Comment: a case a day   Drug use: No   Sexual activity: Not on file  Other Topics Concern   Not on file  Social History Narrative   ** Merged History Encounter **       Social Determinants of Health   Financial Resource Strain: Not on file  Food Insecurity: Unknown (10/29/2022)   Hunger Vital Sign    Worried About Running Out of Food in the Last Year: Patient refused    [place] in the Last Year: Patient refused  Transportation Needs: Unknown (10/29/2022)   PRAPARE - Hydrologist (Medical): Patient refused    Lack of Transportation (Non-Medical): Patient refused  Physical Activity: Not on file  Stress: Not on file  Social Connections: Not on file  Intimate Partner Violence: Unknown (10/29/2022)   Humiliation, Afraid, Rape, and Kick questionnaire    Fear of Current or Ex-Partner: Patient refused    Emotionally Abused: Patient  refused    Physically Abused: Patient refused    Sexually Abused: Patient refused    Review of Systems:    Constitutional: No weight loss, fever or chills  Cardiovascular: No chest pain  Respiratory: No SOB Gastrointestinal: See HPI and otherwise negative   Physical Exam:  Vital signs: BP 124/62   Pulse (!) 52   Ht '5\' 7"'$  (1.702 m)   BMI 20.20 kg/m    Constitutional:   Pleasant chronically ill appearing AA male appears to be in NAD, Well developed, Well nourished, alert and cooperative Respiratory: Respirations even and unlabored. Lungs clear to auscultation bilaterally.   No wheezes, crackles, or rhonchi.  Cardiovascular: Normal S1, S2. No MRG. Regular rate and rhythm. No peripheral edema, cyanosis or pallor.  Gastrointestinal:  Soft, nondistended, nontender. No rebound or guarding. Normal bowel sounds. No appreciable masses or hepatomegaly. Rectal:  Not performed.  Psychiatric: Demonstrates good judgement and reason without abnormal affect or behaviors.m +aphasia  RELEVANT LABS AND IMAGING: CBC    Component Value Date/Time   WBC 6.9 11/05/2022 1714   RBC 2.91 (L) 11/05/2022 1714   HGB 9.7 (L) 11/05/2022 1714   HCT 30.2 (L) 11/05/2022 1714   HCT 21.9 (L) 10/10/2018 0702   PLT 61 (L) 11/05/2022 1714   MCV 103.8 (H) 11/05/2022 1714   MCH 33.3 11/05/2022 1714   MCHC 32.1 11/05/2022 1714   RDW 18.9 (H) 11/05/2022 1714   LYMPHSABS 1.5 11/05/2022 1714   MONOABS 1.1 (H) 11/05/2022 1714   EOSABS 0.2 11/05/2022 1714   BASOSABS 0.0 11/05/2022 1714    CMP     Component Value Date/Time   NA 141 11/05/2022 1714   K 4.1 11/05/2022 1714   CL 111 11/05/2022 1714   CO2 22 11/05/2022 1714   GLUCOSE 90 11/05/2022 1714   BUN 19 11/05/2022 1714   CREATININE 1.49 (H) 11/05/2022 1714   CALCIUM 8.2 (L) 11/05/2022 1714   PROT 6.2 (L) 11/04/2022 1421   ALBUMIN 2.6 (L) 11/04/2022 1421   AST 83 (H) 11/04/2022 1421  ALT 42 11/04/2022 1421   ALKPHOS 99 11/04/2022 1421   BILITOT 1.3  (H) 11/04/2022 1421   GFRNONAA 52 (L) 11/05/2022 1714   GFRAA >60 10/27/2018 0537    Assessment: 1.  History of GI bleed: With recent hospitalization, procedures deferred at the time, it seems stable now with no further bleeding, per notes from the hospital no plans for procedures outpatient unless this continued 2.  Decompensated cirrhosis: With esophageal varices, history of SBP and ascites, unable to pursue diuretics given kidney issues  Plan: 1.  Patient appears somewhat stable today.  Unfortunately I do not know the patient that well, so unsure what his baseline is.  Per nursing staff he seems to be himself.  No further reports of bloody bowel movements. 2.  Today we will recheck labs including a CBC, CMP and PT/INR 3.  Continue current medications including the Lactulose 15 mL twice daily.  Did discuss with nursing staff the need to monitor his stool output, it should be at least 3-4 bowel movements a day, if not we may need to titrate up this medicine. 4.  Continue Omeprazole 40 mg twice daily.  This is on his med list. 5.  If patient is noticed to become distended or uncomfortable plans were for as needed paracentesis given that he cannot tolerate diuretics.  They should call and let us know. 6.  Went ahead and scheduled patient for a follow-up with Dr. Silverio Decamp in 2 to 3 months as he is quite complicated and I do not know him that well. 7.  Also recommended daily weights.  Also low-sodium diet, less than 2 g/day.  Ellouise Newer, PA-C [facility] 11/14/2022, 8:36 AM  Cc: Tyrone Schimke., MD

## 2022-11-14 NOTE — Progress Notes (Signed)
Please call nursing facility/patient and let them know that labs look stable.  Thanks, JL L

## 2022-12-11 ENCOUNTER — Other Ambulatory Visit (HOSPITAL_COMMUNITY): Payer: Self-pay | Admitting: Nurse Practitioner

## 2022-12-11 DIAGNOSIS — R188 Other ascites: Secondary | ICD-10-CM

## 2022-12-13 ENCOUNTER — Encounter (HOSPITAL_COMMUNITY): Payer: Self-pay

## 2022-12-13 ENCOUNTER — Ambulatory Visit (HOSPITAL_COMMUNITY)
Admission: RE | Admit: 2022-12-13 | Discharge: 2022-12-13 | Disposition: A | Discharge status: Home / Self Care | Payer: Medicaid Other | Source: Ambulatory Visit | Attending: Nurse Practitioner | Admitting: Nurse Practitioner

## 2022-12-13 DIAGNOSIS — K7460 Unspecified cirrhosis of liver: Secondary | ICD-10-CM

## 2022-12-13 NOTE — Progress Notes (Signed)
Patient presented to the [facility] IR department for an image-guided paracentesis. Patient arrived from his facility with a person who transported him here but is unable to sign consent. Patient is unable to sign consent for himself. I attempted to call his facility - [facility] at [phone]. The only option with this phone number was to leave a VM which I did. I also tried calling the doctor listed on his paperwork, Dr. Allena Katz. That phone number had a "voice mail box that had not been set up."  The patient was sent back to his facility;  no procedure performed. A progress note was written on his facility paperwork with the above information. The patient has been previously hospitalized at [facility] and emergency consent from two physicians was utilized to perform his paracenteses. As this patient is an outpatient and in stable condition he doesn't meet criteria for an emergency procedure with emergency consent.   Jacob Bass, [place] [phone] 12/13/2022, 9:00 AM

## 2022-12-17 ENCOUNTER — Inpatient Hospital Stay: Payer: Medicaid Other | Admitting: Adult Health

## 2022-12-17 NOTE — Progress Notes (Signed)
PATIENT: Jacob Bass DOB: 12/25/1960  REASON FOR VISIT: follow up HISTORY FROM: patient PRIMARY NEUROLOGIST: Dr. Pearlean Brownie  HISTORY OF PRESENT ILLNESS: Today 12/17/22  Jacob Bass is a 62 y.o. male who is here for hospital follow-up after an incidental finding of left CR/SO infarct likely secondary to small vessel disease.  Returns today for follow-up.  The patient is currently not able to take aspirin due to GI bleed.  He is currently in assisted living facility.  Continues to have left-sided weakness.  Reports that the left leg and foot is swollen.  Caregiver that is with him reports that he is doing much better.  Continues to have slurred speech.  Unable to be on cholesterol medicine such as Lipitor due to cirrhosis and elevated LFTs.  Returns today for follow-up.  HISTORY Jacob Bass is a 62 y.o. male with history of stroke with left sided residual, gastric ulcer, cirrhosis, COPD admitted for altered mental status, worsening left-sided weakness, left facial droop and dysarthria. No tPA given due to outside window.     Stroke, incidental finding:  left CR/SO infarct likely secondary to small vessel disease source CT no acute finding but chronic right CR, bilateral BG and right pontine infarcts. 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  Right P1 moderate stenosis and left P2 moderate stenosis MRI left acute CR small infarct 2D Echo EF 55 to 60% LDL 74 HgbA1c 4.5 UDS negative Heparin subcu for VTE prophylaxis No antithrombotic prior to admission, now on aspirin 81 mg daily. No DAPT for now given thrombocytopenia.  Patient counseled to be compliant with his antithrombotic medications Ongoing aggressive stroke risk factor management Therapy recommendations: SNF Disposition: Pending   Recrudescence of old stroke symptoms in the setting of metabolic encephalopathy Nursing home resident, baseline left-sided weakness from previous stroke No worsening of  left-sided symptoms CT and MRI showed chronic right CR, bilateral BG and right pontine infarcts Ammonia level 99 AKI with creatinine 2.09-> 1.77 AST/ALT 113/64 TSH 10.984 Hemoglobin 8.8 Metabolic encephalopathy management per primary team    REVIEW OF SYSTEMS: Out of a complete 14 system review of symptoms, the patient complains only of the following symptoms, and all other reviewed systems are negative.  ALLERGIES: Allergies  Allergen Reactions   Penicillins Hives   Penicillins Other (See Comments)    Unknown reaction Listed on [facility]    HOME MEDICATIONS: Outpatient Medications Prior to Visit  Medication Sig Dispense Refill   acetaminophen (TYLENOL) 500 MG tablet Take 1,000 mg by mouth every 8 (eight) hours as needed for moderate pain (severe pain).     albuterol (VENTOLIN HFA) 108 (90 Base) MCG/ACT inhaler Inhale 2 puffs into the lungs every 4 (four) hours as needed for wheezing or shortness of breath.     Calcium Carb-Cholecalciferol (CALCIUM + VITAMIN D3) 600-5 MG-MCG TABS Take 1 tablet by mouth daily at 12 noon. (1200)     ciprofloxacin (CIPRO) 500 MG tablet Take 1 tablet (500 mg total) by mouth daily at 8 pm. 28 tablet 0   cyanocobalamin (VITAMIN B12) 1000 MCG tablet Take 1,000 mcg by mouth in the morning. (0900)     feeding supplement, ENSURE ENLIVE, (ENSURE ENLIVE) LIQD Take 237 mLs by mouth daily at 3 pm. 237 mL 12   fluticasone-salmeterol (ADVAIR) 250-50 MCG/ACT AEPB Inhale 1 puff into the lungs in the morning and at bedtime.     folic acid (FOLVITE) 1 MG tablet Take 1 tablet (1 mg total) by  mouth daily.     furosemide (LASIX) 20 MG tablet Take 1 tablet (20 mg total) by mouth daily. 3 tablet 0   ipratropium-albuterol (DUONEB) 0.5-2.5 (3) MG/3ML SOLN Take 3 mLs by nebulization every 4 (four) hours as needed. 360 mL    lactulose (CHRONULAC) 10 GM/15ML solution Take 15 mLs (10 g total) by mouth 2 (two) times daily. 236 mL 0   latanoprost (XALATAN) 0.005 % ophthalmic solution  Place 1 drop into both eyes at bedtime. (0900)     loperamide (IMODIUM A-D) 2 MG tablet Take 4 mg by mouth every 6 (six) hours as needed for diarrhea or loose stools.     magnesium oxide (MAG-OX) 400 (240 Mg) MG tablet Take 400 mg by mouth 3 (three) times daily.     melatonin 5 MG TABS Take 5 mg by mouth at bedtime.     Nutritional Supplements (NUTRITIONAL DRINK) LIQD Take 120 mLs by mouth 3 (three) times daily. House supplement (0900, 1400, 2100)     Nystatin (GERHARDT'S BUTT CREAM) CREA Apply 1 Application topically 4 (four) times daily as needed for irritation. 1 each 1   omeprazole (PRILOSEC OTC) 20 MG tablet Take 40 mg by mouth 2 (two) times daily.     senna-docusate (SENOKOT-S) 8.6-50 MG tablet Take 1 tablet by mouth at bedtime as needed for mild constipation or moderate constipation. 30 tablet 1   tamsulosin (FLOMAX) 0.4 MG CAPS capsule Take 0.4 mg by mouth in the morning. (0900)     tiotropium (SPIRIVA) 18 MCG inhalation capsule Place 18 mcg into inhaler and inhale daily.     Zinc Oxide 10 % OINT Apply 1 Application topically every 6 (six) hours as needed (To buttocks for irritation).     No facility-administered medications prior to visit.    PAST MEDICAL HISTORY: Past Medical History:  Diagnosis Date   CHF (congestive heart failure) (HCC)    Cirrhosis (HCC)    COPD (chronic obstructive pulmonary disease) (HCC)    ETOH abuse    Gastric ulcer    Hypertension    Stroke [facility])     PAST SURGICAL HISTORY: Past Surgical History:  Procedure Laterality Date   BIOPSY  10/19/2018   Procedure: BIOPSY;  Surgeon: Benancio Deeds, MD;  Location: [facility] ENDOSCOPY;  Service: Gastroenterology;;   ESOPHAGOGASTRODUODENOSCOPY (EGD) WITH PROPOFOL N/A 10/19/2018   Procedure: ESOPHAGOGASTRODUODENOSCOPY (EGD);  Surgeon: Benancio Deeds, MD;  Location: [facility] ENDOSCOPY;  Service: Gastroenterology;  Laterality: N/A;   IR PARACENTESIS  09/19/2022   IR PARACENTESIS  09/25/2022   IR PARACENTESIS   10/03/2022   IR PARACENTESIS  10/11/2022   IR PARACENTESIS  10/24/2022   IR PARACENTESIS  10/30/2022    FAMILY HISTORY: No family history on file.  SOCIAL HISTORY: Social History   Socioeconomic History   Marital status: Single    Spouse name: Not on file   Number of children: Not on file   Years of education: Not on file   Highest education level: Not on file  Occupational History   Not on file  Tobacco Use   Smoking status: Every Day    Packs/day: 2    Types: Cigarettes   Smokeless tobacco: Never  Substance and Sexual Activity   Alcohol use: Yes    Comment: a case a day   Drug use: No   Sexual activity: Not on file  Other Topics Concern   Not on file  Social History Narrative   ** Merged History Encounter **  Social Determinants of Health   Financial Resource Strain: Not on file  Food Insecurity: Patient Declined (10/29/2022)   Hunger Vital Sign    Worried About Running Out of Food in the Last Year: Patient declined    Ran Out of Food in the Last Year: Patient declined  Transportation Needs: Patient Declined (10/29/2022)   PRAPARE - Administrator, Civil Service (Medical): Patient declined    Lack of Transportation (Non-Medical): Patient declined  Physical Activity: Not on file  Stress: Not on file  Social Connections: Not on file  Intimate Partner Violence: Patient Declined (10/29/2022)   Humiliation, Afraid, Rape, and Kick questionnaire    Fear of Current or Ex-Partner: Patient declined    Emotionally Abused: Patient declined    Physically Abused: Patient declined    Sexually Abused: Patient declined      PHYSICAL EXAM  Vitals:   12/18/22 0918 12/18/22 0940  BP: (!) 152/103 (!) 148/80  Pulse: 72   Height: 5\' 3"  (1.6 m)    Body mass index is 22.85 kg/m.  Generalized: Well developed, in no acute distress   Neurological examination  Mentation: Alert oriented to person only. Follows all commands speech is slurred and  dysarthric Cranial nerve II-XII: Pupils were equal round reactive to light. Extraocular movements were full, visual field were full on confrontational test. Facial sensation and strength were normal.. Head turning and shoulder shrug  were normal and symmetric. Motor: The motor testing reveals 5 over 5 strength in the right upper and lower extremity.  Minimal movement in the left upper extremity. 1-2/5 in the left lower extremity Sensory: Sensory testing is intact to soft touch on all 4 extremities. No evidence of extinction is noted.  Coordination: Unable to perform Gait and station: Patient is in wheelchair. Reflexes: Deep tendon reflexes are symmetric and normal bilaterally.   DIAGNOSTIC DATA (LABS, IMAGING, TESTING) - I reviewed patient records, labs, notes, testing and imaging myself where available.  Lab Results  Component Value Date   WBC 4.2 11/14/2022   HGB 10.7 (L) 11/14/2022   HCT 31.7 (L) 11/14/2022   MCV 99.5 11/14/2022   PLT 75.0 (L) 11/14/2022      Component Value Date/Time   NA 137 11/14/2022 0905   K 4.1 11/14/2022 0905   CL 108 11/14/2022 0905   CO2 24 11/14/2022 0905   GLUCOSE 116 (H) 11/14/2022 0905   BUN 15 11/14/2022 0905   CREATININE 1.09 11/14/2022 0905   CALCIUM 7.9 (L) 11/14/2022 0905   PROT 6.1 11/14/2022 0905   ALBUMIN 2.3 (L) 11/14/2022 0905   AST 57 (H) 11/14/2022 0905   ALT 31 11/14/2022 0905   ALKPHOS 105 11/14/2022 0905   BILITOT 1.0 11/14/2022 0905   GFRNONAA 52 (L) 11/05/2022 1714   GFRAA >60 10/27/2018 0537   Lab Results  Component Value Date   CHOL 111 09/11/2022   HDL 24 (L) 09/11/2022   LDLCALC 74 09/11/2022   TRIG 64 09/11/2022   CHOLHDL 4.6 09/11/2022   Lab Results  Component Value Date   HGBA1C 4.5 (L) 09/11/2022   Lab Results  Component Value Date   VITAMINB12 2,050 (H) 09/10/2022   Lab Results  Component Value Date   TSH 10.984 (H) 09/10/2022      ASSESSMENT AND PLAN 62 y.o. year old male  has a past medical  history of CHF (congestive heart failure) (HCC), Cirrhosis (HCC), COPD (chronic obstructive pulmonary disease) (HCC), ETOH abuse, Gastric ulcer, Hypertension, and Stroke (HCC). here  with:  Stroke, incidental finding:  left CR/SO infarct likely secondary to small vessel disease source  Residual deficit: Left-sided weakness.  Unable to be on a blood thinner due to GI bleed.  Discussed secondary stroke prevention measures and importance of close PCP follow up for aggressive stroke risk factor management. I have gone over the pathophysiology of stroke, warning signs and symptoms, risk factors and their management in some detail with instructions to go to the closest emergency room for symptoms of concern. HTN: BP goal <130/90.  Slightly elevated advised the facility to continue monitoring start BP medicine if needed HLD: LDL goal <70. Recent LDL 74 unable to be on Lipitor currently due to elevated LFTs.  DMII: A1c goal<7.0. Recent A1c 4.5.  Encouraged patient to monitor diet and encouraged exercise FU with our office PRN       Butch Penny, MSN, NP-C 12/17/2022, 4:03 PM [facility] [address] [place], [place] [address] [phone]

## 2022-12-18 ENCOUNTER — Encounter: Payer: Self-pay | Admitting: Adult Health

## 2022-12-18 ENCOUNTER — Ambulatory Visit (INDEPENDENT_AMBULATORY_CARE_PROVIDER_SITE_OTHER): Payer: Medicaid Other | Admitting: Adult Health

## 2022-12-18 VITALS — BP 148/80 | HR 72 | Ht 63.0 in

## 2022-12-18 DIAGNOSIS — K7460 Unspecified cirrhosis of liver: Secondary | ICD-10-CM | POA: Diagnosis not present

## 2022-12-18 DIAGNOSIS — K253 Acute gastric ulcer without hemorrhage or perforation: Secondary | ICD-10-CM

## 2022-12-18 DIAGNOSIS — I639 Cerebral infarction, unspecified: Secondary | ICD-10-CM

## 2022-12-18 NOTE — Progress Notes (Signed)
Reviewed and agree with documentation and assessment and plan. K. Veena Kambri Dismore , MD   

## 2022-12-24 ENCOUNTER — Other Ambulatory Visit: Payer: Self-pay | Admitting: Physician Assistant

## 2022-12-24 ENCOUNTER — Ambulatory Visit (HOSPITAL_COMMUNITY)
Admission: RE | Admit: 2022-12-24 | Discharge: 2022-12-24 | Disposition: A | Discharge status: Home / Self Care | Payer: Medicaid Other | Source: Ambulatory Visit | Attending: Physician Assistant | Admitting: Physician Assistant

## 2022-12-24 ENCOUNTER — Other Ambulatory Visit: Payer: Self-pay

## 2022-12-24 DIAGNOSIS — K7031 Alcoholic cirrhosis of liver with ascites: Secondary | ICD-10-CM | POA: Insufficient documentation

## 2022-12-24 DIAGNOSIS — K7030 Alcoholic cirrhosis of liver without ascites: Secondary | ICD-10-CM

## 2022-12-24 HISTORY — PX: IR PARACENTESIS: IMG2679

## 2022-12-24 MED ORDER — LIDOCAINE HCL 1 % IJ SOLN
20.0000 mL | Freq: Once | INTRAMUSCULAR | Status: AC
  Administered 2022-12-24: 10 mL

## 2022-12-24 MED ORDER — LIDOCAINE HCL 1 % IJ SOLN
INTRAMUSCULAR | Status: AC
  Filled 2022-12-24: qty 20

## 2022-12-24 NOTE — Procedures (Signed)
Patient alert and oriented x 3 and able to sign his own consent. He answered all my questions appropriately.  PROCEDURE SUMMARY:  Successful US guided paracentesis from left lateral abdomen.  Yielded 4.7 liters of cloudy yellow fluid.  No immediate complications.  Patient tolerated well.  EBL = trace  Madalynne Gutmann S Sheran Newstrom PA-C 12/24/2022 9:03 AM

## 2023-01-11 ENCOUNTER — Inpatient Hospital Stay (HOSPITAL_COMMUNITY)
Admission: EM | Admit: 2023-01-11 | Discharge: 2023-01-16 | DRG: 433 | Disposition: A | Discharge status: Skilled Nursing Facility | Payer: Medicaid Other | Source: Skilled Nursing Facility | Attending: Internal Medicine | Admitting: Internal Medicine

## 2023-01-11 ENCOUNTER — Emergency Department (HOSPITAL_COMMUNITY): Payer: Medicaid Other

## 2023-01-11 ENCOUNTER — Encounter (HOSPITAL_COMMUNITY): Payer: Self-pay

## 2023-01-11 ENCOUNTER — Other Ambulatory Visit: Payer: Self-pay

## 2023-01-11 DIAGNOSIS — R601 Generalized edema: Secondary | ICD-10-CM | POA: Diagnosis present

## 2023-01-11 DIAGNOSIS — N1832 Chronic kidney disease, stage 3b: Secondary | ICD-10-CM | POA: Diagnosis present

## 2023-01-11 DIAGNOSIS — N400 Enlarged prostate without lower urinary tract symptoms: Secondary | ICD-10-CM | POA: Diagnosis present

## 2023-01-11 DIAGNOSIS — Z7951 Long term (current) use of inhaled steroids: Secondary | ICD-10-CM

## 2023-01-11 DIAGNOSIS — D631 Anemia in chronic kidney disease: Secondary | ICD-10-CM | POA: Diagnosis present

## 2023-01-11 DIAGNOSIS — F0390 Unspecified dementia without behavioral disturbance: Secondary | ICD-10-CM | POA: Diagnosis present

## 2023-01-11 DIAGNOSIS — Z880 Allergy status to penicillin: Secondary | ICD-10-CM | POA: Diagnosis not present

## 2023-01-11 DIAGNOSIS — Z79899 Other long term (current) drug therapy: Secondary | ICD-10-CM

## 2023-01-11 DIAGNOSIS — I69354 Hemiplegia and hemiparesis following cerebral infarction affecting left non-dominant side: Secondary | ICD-10-CM

## 2023-01-11 DIAGNOSIS — D649 Anemia, unspecified: Secondary | ICD-10-CM | POA: Diagnosis present

## 2023-01-11 DIAGNOSIS — I129 Hypertensive chronic kidney disease with stage 1 through stage 4 chronic kidney disease, or unspecified chronic kidney disease: Secondary | ICD-10-CM | POA: Diagnosis present

## 2023-01-11 DIAGNOSIS — J449 Chronic obstructive pulmonary disease, unspecified: Secondary | ICD-10-CM | POA: Diagnosis present

## 2023-01-11 DIAGNOSIS — I10 Essential (primary) hypertension: Secondary | ICD-10-CM | POA: Diagnosis present

## 2023-01-11 DIAGNOSIS — R188 Other ascites: Secondary | ICD-10-CM | POA: Diagnosis present

## 2023-01-11 DIAGNOSIS — Z8711 Personal history of peptic ulcer disease: Secondary | ICD-10-CM | POA: Diagnosis not present

## 2023-01-11 DIAGNOSIS — E8809 Other disorders of plasma-protein metabolism, not elsewhere classified: Secondary | ICD-10-CM | POA: Diagnosis present

## 2023-01-11 DIAGNOSIS — D638 Anemia in other chronic diseases classified elsewhere: Secondary | ICD-10-CM | POA: Diagnosis present

## 2023-01-11 DIAGNOSIS — K7031 Alcoholic cirrhosis of liver with ascites: Secondary | ICD-10-CM | POA: Diagnosis not present

## 2023-01-11 DIAGNOSIS — F1010 Alcohol abuse, uncomplicated: Secondary | ICD-10-CM | POA: Diagnosis present

## 2023-01-11 DIAGNOSIS — B1920 Unspecified viral hepatitis C without hepatic coma: Secondary | ICD-10-CM | POA: Diagnosis present

## 2023-01-11 DIAGNOSIS — Z87891 Personal history of nicotine dependence: Secondary | ICD-10-CM | POA: Diagnosis not present

## 2023-01-11 DIAGNOSIS — K7460 Unspecified cirrhosis of liver: Secondary | ICD-10-CM | POA: Diagnosis present

## 2023-01-11 DIAGNOSIS — Z8673 Personal history of transient ischemic attack (TIA), and cerebral infarction without residual deficits: Secondary | ICD-10-CM

## 2023-01-11 LAB — BRAIN NATRIURETIC PEPTIDE: B Natriuretic Peptide: 59.1 pg/mL (ref 0.0–100.0)

## 2023-01-11 LAB — IRON AND TIBC
Iron: 57 ug/dL (ref 45–182)
Saturation Ratios: 45 % — ABNORMAL HIGH (ref 17.9–39.5)
TIBC: 126 ug/dL — ABNORMAL LOW (ref 250–450)
UIBC: 69 ug/dL

## 2023-01-11 LAB — CBC WITH DIFFERENTIAL/PLATELET
Abs Immature Granulocytes: 0.03 10*3/uL (ref 0.00–0.07)
Basophils Absolute: 0 10*3/uL (ref 0.0–0.1)
Basophils Relative: 1 %
Eosinophils Absolute: 0.4 10*3/uL (ref 0.0–0.5)
Eosinophils Relative: 6 %
HCT: 24.4 % — ABNORMAL LOW (ref 39.0–52.0)
Hemoglobin: 7.9 g/dL — ABNORMAL LOW (ref 13.0–17.0)
Immature Granulocytes: 1 %
Lymphocytes Relative: 21 %
Lymphs Abs: 1.3 10*3/uL (ref 0.7–4.0)
MCH: 32.2 pg (ref 26.0–34.0)
MCHC: 32.4 g/dL (ref 30.0–36.0)
MCV: 99.6 fL (ref 80.0–100.0)
Monocytes Absolute: 1 10*3/uL (ref 0.1–1.0)
Monocytes Relative: 16 %
Neutro Abs: 3.5 10*3/uL (ref 1.7–7.7)
Neutrophils Relative %: 55 %
Platelets: 151 10*3/uL (ref 150–400)
RBC: 2.45 MIL/uL — ABNORMAL LOW (ref 4.22–5.81)
RDW: 17.5 % — ABNORMAL HIGH (ref 11.5–15.5)
WBC: 6.2 10*3/uL (ref 4.0–10.5)
nRBC: 0 % (ref 0.0–0.2)

## 2023-01-11 LAB — PREPARE RBC (CROSSMATCH)

## 2023-01-11 LAB — POC OCCULT BLOOD, ED: Fecal Occult Bld: NEGATIVE

## 2023-01-11 LAB — FOLATE: Folate: 24.7 ng/mL (ref >5.9)

## 2023-01-11 LAB — VITAMIN B12: Vitamin B-12: 1389 pg/mL — ABNORMAL HIGH (ref 180–914)

## 2023-01-11 LAB — COMPREHENSIVE METABOLIC PANEL
ALT: 25 U/L (ref 0–44)
AST: 58 U/L — ABNORMAL HIGH (ref 15–41)
Albumin: 1.6 g/dL — ABNORMAL LOW (ref 3.5–5.0)
Alkaline Phosphatase: 80 U/L (ref 38–126)
Anion gap: 7 (ref 5–15)
BUN: 16 mg/dL (ref 8–23)
CO2: 23 mmol/L (ref 22–32)
Calcium: 7.6 mg/dL — ABNORMAL LOW (ref 8.9–10.3)
Chloride: 103 mmol/L (ref 98–111)
Creatinine, Ser: 1.07 mg/dL (ref 0.61–1.24)
GFR, Estimated: >60 mL/min (ref >60)
Glucose, Bld: 101 mg/dL — ABNORMAL HIGH (ref 70–99)
Potassium: 4.2 mmol/L (ref 3.5–5.1)
Sodium: 133 mmol/L — ABNORMAL LOW (ref 135–145)
Total Bilirubin: 1.4 mg/dL — ABNORMAL HIGH (ref 0.3–1.2)
Total Protein: 6.4 g/dL — ABNORMAL LOW (ref 6.5–8.1)

## 2023-01-11 LAB — PROTIME-INR
INR: 1.6 — ABNORMAL HIGH (ref 0.8–1.2)
Prothrombin Time: 19.5 seconds — ABNORMAL HIGH (ref 11.4–15.2)

## 2023-01-11 LAB — FERRITIN: Ferritin: 342 ng/mL — ABNORMAL HIGH (ref 24–336)

## 2023-01-11 LAB — RETICULOCYTES
Immature Retic Fract: 10.1 % (ref 2.3–15.9)
RBC.: 2.46 MIL/uL — ABNORMAL LOW (ref 4.22–5.81)
Retic Count, Absolute: 47 10*3/uL (ref 19.0–186.0)
Retic Ct Pct: 1.9 % (ref 0.4–3.1)

## 2023-01-11 LAB — AMMONIA: Ammonia: 59 umol/L — ABNORMAL HIGH (ref 9–35)

## 2023-01-11 MED ORDER — FUROSEMIDE 10 MG/ML IJ SOLN
20.0000 mg | Freq: Once | INTRAMUSCULAR | Status: AC
  Administered 2023-01-11: 20 mg via INTRAVENOUS
  Filled 2023-01-11: qty 2

## 2023-01-11 MED ORDER — SODIUM CHLORIDE 0.9% IV SOLUTION: Freq: Once | INTRAVENOUS | Status: AC

## 2023-01-11 MED ORDER — FUROSEMIDE 10 MG/ML IJ SOLN: 40.0000 mg | Freq: Every day | INTRAMUSCULAR | Status: DC

## 2023-01-11 NOTE — ED Provider Notes (Signed)
[place] EMERGENCY DEPARTMENT AT [facility] Provider Note   CSN: [id number] Arrival date & time: 01/11/23  1537     History  Chief Complaint  Patient presents with   Edema    Jacob Bass is a 62 y.o. male.  Pt is a 62 yo male with pmhx significant for htn, chf, cva, cirrhosis, copd, gastric ulcer, and hx etoh abuse.  Pt is from SNF and they called EMS because of abd swelling and sob.  Facility obtained an O2 sat of 70% on 2L, but EMS said it was 90%.  Last paracentesis was done on 4/22 by IR.  Pt is a poor historian and is unable to give me much hx.       Home Medications Prior to Admission medications   Medication Sig Start Date End Date Taking? Authorizing Provider  acetaminophen (TYLENOL) 500 MG tablet Take 1,000 mg by mouth every 8 (eight) hours as needed for moderate pain (severe pain).    [provider]  albuterol (VENTOLIN HFA) 108 (90 Base) MCG/ACT inhaler Inhale 2 puffs into the lungs every 4 (four) hours as needed for wheezing or shortness of breath.    [provider]  Calcium Carb-Cholecalciferol (CALCIUM + VITAMIN D3) 600-5 MG-MCG TABS Take 1 tablet by mouth daily at 12 noon. (1200)    [provider]  ciprofloxacin (CIPRO) 500 MG tablet Take 1 tablet (500 mg total) by mouth daily at 8 pm. 11/02/22   [name], [name], MD  cyanocobalamin (VITAMIN B12) 1000 MCG tablet Take 1,000 mcg by mouth in the morning. (0900)    [provider]  feeding supplement, ENSURE ENLIVE, (ENSURE ENLIVE) LIQD Take 237 mLs by mouth daily at 3 pm. 10/28/18   [name], MD  fluticasone-salmeterol (ADVAIR) 250-50 MCG/ACT AEPB Inhale 1 puff into the lungs in the morning and at bedtime.    [provider]  folic acid (FOLVITE) 1 MG tablet Take 1 tablet (1 mg total) by mouth daily. 10/28/18   [name], MD  furosemide (LASIX) 20 MG tablet Take 1 tablet (20 mg total) by mouth daily. 11/02/22   [name], [name], MD  ipratropium-albuterol  (DUONEB) 0.5-2.5 (3) MG/3ML SOLN Take 3 mLs by nebulization every 4 (four) hours as needed. 10/27/18   [name], MD  lactulose (CHRONULAC) 10 GM/15ML solution Take 15 mLs (10 g total) by mouth 2 (two) times daily. 10/26/22   [name], MD  latanoprost (XALATAN) 0.005 % ophthalmic solution Place 1 drop into both eyes at bedtime. (0900)    [provider]  loperamide (IMODIUM A-D) 2 MG tablet Take 4 mg by mouth every 6 (six) hours as needed for diarrhea or loose stools.    [provider]  magnesium oxide (MAG-OX) 400 (240 Mg) MG tablet Take 400 mg by mouth 3 (three) times daily.    [provider]  melatonin 5 MG TABS Take 5 mg by mouth at bedtime.    [provider]  Nutritional Supplements (NUTRITIONAL DRINK) LIQD Take 120 mLs by mouth 3 (three) times daily. House supplement (0900, 1400, 2100)    [provider]  Nystatin (GERHARDT'S BUTT CREAM) CREA Apply 1 Application topically 4 (four) times daily as needed for irritation. 10/26/22   [name], MD  omeprazole (PRILOSEC OTC) 20 MG tablet Take 40 mg by mouth 2 (two) times daily.    [provider]  senna-docusate (SENOKOT-S) 8.6-50 MG tablet Take 1 tablet by mouth at bedtime as  needed for mild constipation or moderate constipation. 10/26/22   [name], MD  tamsulosin (FLOMAX) 0.4 MG CAPS capsule Take 0.4 mg by mouth in the morning. (0900)    [provider]  tiotropium (SPIRIVA) 18 MCG inhalation capsule Place 18 mcg into inhaler and inhale daily.    [provider]  Zinc Oxide 10 % OINT Apply 1 Application topically every 6 (six) hours as needed (To buttocks for irritation).    [provider]      Allergies    Penicillins and Penicillins    Review of Systems   Review of Systems  Respiratory:  Positive for shortness of breath.   All other systems reviewed and are negative.   Physical Exam Updated Vital Signs BP (!) 167/85    Pulse 90   Temp 97.7 F (36.5 C) (Oral)   Resp 16   SpO2 100%  Physical Exam Vitals and nursing note reviewed. Exam conducted with a chaperone present.  Constitutional:      Appearance: He is ill-appearing.  HENT:     Right Ear: External ear normal.     Left Ear: External ear normal.     Nose: Nose normal.     Mouth/Throat:     Mouth: Mucous membranes are dry.  Eyes:     Extraocular Movements: Extraocular movements intact.     Conjunctiva/sclera: Conjunctivae normal.     Pupils: Pupils are equal, round, and reactive to light.  Cardiovascular:     Rate and Rhythm: Normal rate and regular rhythm.     Pulses: Normal pulses.     Heart sounds: Normal heart sounds.  Pulmonary:     Effort: Pulmonary effort is normal.     Breath sounds: Normal breath sounds.  Abdominal:     General: Abdomen is protuberant.     Palpations: There is fluid wave.     Tenderness: There is no abdominal tenderness.  Genitourinary:    Rectum: Guaiac result negative.     Comments: Swollen scrotum Musculoskeletal:     Cervical back: Normal range of motion and neck supple.     Right lower leg: Edema present.     Left lower leg: Edema present.     Comments: Edema up to abdomen  Skin:    General: Skin is warm.     Capillary Refill: Capillary refill takes less than 2 seconds.  Neurological:     Mental Status: He is alert.     Comments: Pt has contractures of the left side.  Psychiatric:        Mood and Affect: Mood normal.     ED Results / Procedures / Treatments   Labs (all labs ordered are listed, but only abnormal results are displayed) Labs Reviewed  CBC WITH DIFFERENTIAL/PLATELET - Abnormal; Notable for the following components:      Result Value   RBC 2.45 (*)    Hemoglobin 7.9 (*)    HCT 24.4 (*)    RDW 17.5 (*)    All other components within normal limits  COMPREHENSIVE METABOLIC PANEL - Abnormal; Notable for the following components:   Sodium 133 (*)    Glucose, Bld 101 (*)     Calcium 7.6 (*)    Total Protein 6.4 (*)    Albumin 1.6 (*)    AST 58 (*)    Total Bilirubin 1.4 (*)    All other components within normal limits  PROTIME-INR - Abnormal; Notable for the following components:   Prothrombin Time  19.5 (*)    INR 1.6 (*)    All other components within normal limits  AMMONIA - Abnormal; Notable for the following components:   Ammonia 59 (*)    All other components within normal limits  BRAIN NATRIURETIC PEPTIDE  VITAMIN B12  FOLATE  IRON AND TIBC  FERRITIN  RETICULOCYTES  POC OCCULT BLOOD, ED  TYPE AND SCREEN  PREPARE RBC (CROSSMATCH)    EKG EKG Interpretation  Date/Time:  Friday Jan 11 2023 17:27:47 EDT Ventricular Rate:  89 PR Interval:  117 QRS Duration: 87 QT Interval:  372 QTC Calculation: 453 R Axis:   28 Text Interpretation: Sinus rhythm Borderline short PR interval Low voltage, extremity and precordial leads Probable anteroseptal infarct, old No significant change since last tracing Confirmed by Jacalyn Lefevre [phone]) on 01/11/2023 7:16:31 PM  Radiology DG Chest Port 1 View  Result Date: 01/11/2023 CLINICAL DATA:  Shortness of breath EXAM: PORTABLE CHEST 1 VIEW COMPARISON:  10/28/2018 FINDINGS: Cardiac shadow is stable. The lungs are hypoinflated but clear. Previously seen left basilar changes have resolved in the interval. No new focal abnormality is seen. No bony changes are noted. IMPRESSION: No active disease. Electronically Signed   By: Alcide Clever M.D.   On: 01/11/2023 17:51    Procedures Procedures    Medications Ordered in ED Medications  0.9 %  sodium chloride infusion (Manually program via Guardrails IV Fluids) (has no administration in time range)  furosemide (LASIX) injection 20 mg (20 mg Intravenous Given 01/11/23 1851)    ED Course/ Medical Decision Making/ A&P                             Medical Decision Making Amount and/or Complexity of Data Reviewed Labs: ordered. Radiology:  ordered.  Risk Prescription drug management. Decision regarding hospitalization.   This patient presents to the ED for concern of sob, this involves an extensive number of treatment options, and is a complaint that carries with it a high risk of complications and morbidity.  The differential diagnosis includes chf, copd, electrolyte abn   Co morbidities that complicate the patient evaluation  htn, chf, cva, cirrhosis, copd, gastric ulcer, and hx etoh abuse   Additional history obtained:  Additional history obtained from epic chart review External records from outside source obtained and reviewed including EMS report   Lab Tests:  I Ordered, and personally interpreted labs.  The pertinent results include:  cbc with hgb 7.9 (hgb 10.7 3/13), cmp with albumin low at 1.6, calcium low at 7.6, ammonia elevated at 59, inr 1.6   Imaging Studies ordered:  I ordered imaging studies including cxr  I independently visualized and interpreted imaging which showed No active disease.  I agree with the radiologist interpretation   Cardiac Monitoring:  The patient was maintained on a cardiac monitor.  I personally viewed and interpreted the cardiac monitored which showed an underlying rhythm of: nsr   Medicines ordered and prescription drug management:  I ordered medication including lasix  for edema  Reevaluation of the patient after these medicines showed that the patient improved I have reviewed the patients home medicines and have made adjustments as needed   Test Considered:  ct   Critical Interventions:  transfusion   Consultations Obtained:  I requested consultation with the hospitalist (Dr. Loney Loh),  and discussed lab and imaging findings as well as pertinent plan - she will admit   Problem List / ED Course:  Anemia:  pt does have a hx of GI bleeds ([facility]), but stool is yellow and guaiac is neg.  MCV is 99.6, so anemia panel is sent.  Pt is sob, so he's given  1 unit of prbcs. Anasarca:  albumin is very low.  Apparently, pt has not been able to tolerate diuretics in the past due to acute on chronic renal insufficiency.  GFR >60 today, so I have ordered a small dose of lasix for the fluid overload.   Ascites:  pt will need paracentesis in am Cirrhosis:  due to hx etoh abuse and hep c   Reevaluation:  After the interventions noted above, I reevaluated the patient and found that they have :improved   Social Determinants of Health:  Lives in snf   Dispostion:  After consideration of the diagnostic results and the patients response to treatment, I feel that the patent would benefit from admission.    CRITICAL CARE Performed by: Jacalyn Lefevre   Total critical care time: 30 minutes  Critical care time was exclusive of separately billable procedures and treating other patients.  Critical care was necessary to treat or prevent imminent or life-threatening deterioration.  Critical care was time spent personally by me on the following activities: development of treatment plan with patient and/or surrogate as well as nursing, discussions with consultants, evaluation of patient's response to treatment, examination of patient, obtaining history from patient or surrogate, ordering and performing treatments and interventions, ordering and review of laboratory studies, ordering and review of radiographic studies, pulse oximetry and re-evaluation of patient's condition.         Final Clinical Impression(s) / ED Diagnoses Final diagnoses:  Hypoalbuminemia  Anasarca  Symptomatic anemia  Ascites due to alcoholic cirrhosis [facility])    Rx / DC Orders ED Discharge Orders     None         Jacalyn Lefevre, MD 01/11/23 [id number]

## 2023-01-11 NOTE — ED Notes (Signed)
IV team at bedside 

## 2023-01-11 NOTE — ED Triage Notes (Signed)
Pt bob ems due to abd swelling. Facility states pt was satting in 70% on 2L of o2. Pt has been in the 90% the whole time with EMS. Pt was scheduled for paracentesis. Lower extremity edema.

## 2023-01-11 NOTE — H&P (Signed)
History and Physical    Jacob Bass WJX:[id number] DOB: 12-06-1960 DOA: 01/11/2023  PCP: Donia Ast., MD  Patient coming from: SNF  Chief Complaint: Abdominal swelling  HPI: Jacob Bass is a 62 y.o. male with medical history significant of alcoholic liver cirrhosis, HCV, history of SBP, portal gastropathy, thrombocytopenia, pleural effusion, recurrent ascites, stroke with residual left-sided weakness, dementia, anemia, diverticular GI bleed, CKD stage 2-3a, COPD, hypertension, BPH presented to ED for evaluation of abdominal and bilateral lower extremity swelling, shortness of breath.  His facility obtain an O2 sat of 70% on 2 L but EMS stated it was 90%.  Last paracentesis was done 4/22 by IR.  In the ED, patient was afebrile.  Labs showing WBC 6.2, hemoglobin 7.9 (baseline 9-10), MCV 99.6, platelet count 151k (improved), sodium 133, potassium 4.2, chloride 103, bicarb 23, BUN 16, creatinine 1.0, glucose 101, calcium 7.6, albumin 1.6, AST 58, ALT 25, alk phos 80, T. bili 1.4, BNP 59.1, INR 1.6, ammonia 59, FOBT negative, anemia panel pending.  Chest x-ray showing no active disease.  Patient was given IV Lasix 20 mg.  TRH called to admit.  Patient has dementia and is oriented to person and place only.  He is not able to give much information.  He is not sure why he is here and has no complaints.  Denies chest pain, shortness of breath, or abdominal pain.  Review of Systems:  Review of Systems  All other systems reviewed and are negative.   Past Medical History:  Diagnosis Date   CHF (congestive heart failure) (HCC)    Cirrhosis (HCC)    COPD (chronic obstructive pulmonary disease) (HCC)    ETOH abuse    Gastric ulcer    Hypertension    Stroke [facility])     Past Surgical History:  Procedure Laterality Date   BIOPSY  10/19/2018   Procedure: BIOPSY;  Surgeon: Benancio Deeds, MD;  Location: [facility] ENDOSCOPY;  Service: Gastroenterology;;   ESOPHAGOGASTRODUODENOSCOPY (EGD) WITH  PROPOFOL N/A 10/19/2018   Procedure: ESOPHAGOGASTRODUODENOSCOPY (EGD);  Surgeon: Benancio Deeds, MD;  Location: [facility] ENDOSCOPY;  Service: Gastroenterology;  Laterality: N/A;   IR PARACENTESIS  09/19/2022   IR PARACENTESIS  09/25/2022   IR PARACENTESIS  10/03/2022   IR PARACENTESIS  10/11/2022   IR PARACENTESIS  10/24/2022   IR PARACENTESIS  10/30/2022   IR PARACENTESIS  12/24/2022     reports that he quit smoking about 4 months ago. His smoking use included cigarettes. He smoked an average of 2 packs per day. He has never used smokeless tobacco. He reports current alcohol use. He reports that he does not use drugs.  Allergies  Allergen Reactions   Penicillins Hives   Penicillins Other (See Comments)    Unknown reaction Listed on MAR    History reviewed. No pertinent family history.  Prior to Admission medications   Medication Sig Start Date End Date Taking? Authorizing Provider  acetaminophen (TYLENOL) 500 MG tablet Take 1,000 mg by mouth every 8 (eight) hours as needed for moderate pain (severe pain).    [provider]  albuterol (VENTOLIN HFA) 108 (90 Base) MCG/ACT inhaler Inhale 2 puffs into the lungs every 4 (four) hours as needed for wheezing or shortness of breath.    [provider]  Calcium Carb-Cholecalciferol (CALCIUM + VITAMIN D3) 600-5 MG-MCG TABS Take 1 tablet by mouth daily at 12 noon. (1200)    [provider]  ciprofloxacin (CIPRO) 500 MG tablet Take 1 tablet (500 mg  total) by mouth daily at 8 pm. 11/02/22   [name], [name], MD  cyanocobalamin (VITAMIN B12) 1000 MCG tablet Take 1,000 mcg by mouth in the morning. (0900)    [provider]  feeding supplement, ENSURE ENLIVE, (ENSURE ENLIVE) LIQD Take 237 mLs by mouth daily at 3 pm. 10/28/18   [name], MD  fluticasone-salmeterol (ADVAIR) 250-50 MCG/ACT AEPB Inhale 1 puff into the lungs in the morning and at bedtime.    [provider]  folic acid (FOLVITE) 1 MG tablet Take 1  tablet (1 mg total) by mouth daily. 10/28/18   [name], MD  furosemide (LASIX) 20 MG tablet Take 1 tablet (20 mg total) by mouth daily. 11/02/22   [name], [name], MD  ipratropium-albuterol (DUONEB) 0.5-2.5 (3) MG/3ML SOLN Take 3 mLs by nebulization every 4 (four) hours as needed. 10/27/18   [name], MD  lactulose (CHRONULAC) 10 GM/15ML solution Take 15 mLs (10 g total) by mouth 2 (two) times daily. 10/26/22   [name], MD  latanoprost (XALATAN) 0.005 % ophthalmic solution Place 1 drop into both eyes at bedtime. (0900)    [provider]  loperamide (IMODIUM A-D) 2 MG tablet Take 4 mg by mouth every 6 (six) hours as needed for diarrhea or loose stools.    [provider]  magnesium oxide (MAG-OX) 400 (240 Mg) MG tablet Take 400 mg by mouth 3 (three) times daily.    [provider]  melatonin 5 MG TABS Take 5 mg by mouth at bedtime.    [provider]  Nutritional Supplements (NUTRITIONAL DRINK) LIQD Take 120 mLs by mouth 3 (three) times daily. House supplement (0900, 1400, 2100)    [provider]  Nystatin (GERHARDT'S BUTT CREAM) CREA Apply 1 Application topically 4 (four) times daily as needed for irritation. 10/26/22   [name], MD  omeprazole (PRILOSEC OTC) 20 MG tablet Take 40 mg by mouth 2 (two) times daily.    [provider]  senna-docusate (SENOKOT-S) 8.6-50 MG tablet Take 1 tablet by mouth at bedtime as needed for mild constipation or moderate constipation. 10/26/22   [name], MD  tamsulosin (FLOMAX) 0.4 MG CAPS capsule Take 0.4 mg by mouth in the morning. (0900)    [provider]  tiotropium (SPIRIVA) 18 MCG inhalation capsule Place 18 mcg into inhaler and inhale daily.    [provider]  Zinc Oxide 10 % OINT Apply 1 Application topically every 6 (six) hours as needed (To buttocks for irritation).    [provider]    Physical Exam: Vitals:   01/11/23 1615 01/11/23 1645  01/11/23 1735 01/11/23 1930  BP: (!) 159/90 (!) 167/85  (!) 165/74  Pulse: 89 (!) 101 90   Resp: 20 18 16 19   Temp:      TempSrc:      SpO2: 100% 100% 100%     Physical Exam Vitals reviewed.  Constitutional:      General: He is not in acute distress. HENT:     Head: Normocephalic and atraumatic.  Eyes:     Extraocular Movements: Extraocular movements intact.  Cardiovascular:     Rate and Rhythm: Normal rate and regular rhythm.     Pulses: Normal pulses.  Pulmonary:     Effort: Pulmonary effort is normal. No respiratory distress.     Breath sounds: Normal breath sounds. No wheezing or rales.  Abdominal:     General: Bowel sounds are normal. There is distension.  Tenderness: There is no abdominal tenderness. There is no guarding.     Comments: Abdomen significantly distended  Musculoskeletal:     Cervical back: Normal range of motion.     Right lower leg: Edema present.     Left lower leg: Edema present.     Comments: 4+ pitting edema of bilateral lower extremities extending up to the abdomen  Skin:    General: Skin is warm and dry.  Neurological:     Mental Status: He is alert.     Cranial Nerves: No cranial nerve deficit.     Comments: Oriented to person and place History of prior stroke with residual left-sided weakness.  Patient is moving his right upper extremity and bilateral lower extremities spontaneously.     Labs on Admission: I have personally reviewed following labs and imaging studies  CBC: Recent Labs  Lab 01/11/23 1747  WBC 6.2  NEUTROABS 3.5  HGB 7.9*  HCT 24.4*  MCV 99.6  PLT 151   Basic Metabolic Panel: Recent Labs  Lab 01/11/23 1747  NA 133*  K 4.2  CL 103  CO2 23  GLUCOSE 101*  BUN 16  CREATININE 1.07  CALCIUM 7.6*   GFR: CrCl cannot be calculated (Unknown ideal weight.). Liver Function Tests: Recent Labs  Lab 01/11/23 1747  AST 58*  ALT 25  ALKPHOS 80  BILITOT 1.4*  PROT 6.4*  ALBUMIN 1.6*   No results for  input(s): "LIPASE", "AMYLASE" in the last 168 hours. Recent Labs  Lab 01/11/23 1747  AMMONIA 59*   Coagulation Profile: Recent Labs  Lab 01/11/23 1747  INR 1.6*   Cardiac Enzymes: No results for input(s): "CKTOTAL", "CKMB", "CKMBINDEX", "TROPONINI" in the last 168 hours. BNP (last 3 results) No results for input(s): "PROBNP" in the last 8760 hours. HbA1C: No results for input(s): "HGBA1C" in the last 72 hours. CBG: No results for input(s): "GLUCAP" in the last 168 hours. Lipid Profile: No results for input(s): "CHOL", "HDL", "LDLCALC", "TRIG", "CHOLHDL", "LDLDIRECT" in the last 72 hours. Thyroid Function Tests: No results for input(s): "TSH", "T4TOTAL", "FREET4", "T3FREE", "THYROIDAB" in the last 72 hours. Anemia Panel: No results for input(s): "VITAMINB12", "FOLATE", "FERRITIN", "TIBC", "IRON", "RETICCTPCT" in the last 72 hours. Urine analysis:    Component Value Date/Time   COLORURINE AMBER (A) 10/02/2022 0521   APPEARANCEUR HAZY (A) 10/02/2022 0521   LABSPEC 1.015 10/02/2022 0521   PHURINE 5.0 10/02/2022 0521   GLUCOSEU NEGATIVE 10/02/2022 0521   HGBUR NEGATIVE 10/02/2022 0521   BILIRUBINUR NEGATIVE 10/02/2022 0521   KETONESUR NEGATIVE 10/02/2022 0521   PROTEINUR NEGATIVE 10/02/2022 0521   NITRITE NEGATIVE 10/02/2022 0521   LEUKOCYTESUR NEGATIVE 10/02/2022 0521    Radiological Exams on Admission: DG Chest Port 1 View  Result Date: 01/11/2023 CLINICAL DATA:  Shortness of breath EXAM: PORTABLE CHEST 1 VIEW COMPARISON:  10/28/2018 FINDINGS: Cardiac shadow is stable. The lungs are hypoinflated but clear. Previously seen left basilar changes have resolved in the interval. No new focal abnormality is seen. No bony changes are noted. IMPRESSION: No active disease. Electronically Signed   By: Alcide Clever M.D.   On: 01/11/2023 17:51    EKG: Independently reviewed.  Sinus rhythm, no significant change since prior tracing.  Assessment and Plan  Decompensated liver  cirrhosis Recurrent ascites Patient is presenting with significant volume overload/anasarca and ascites. SBP less likely given no fever or leukocytosis.  No abdominal pain or tenderness.  Continue diuresis with IV Lasix 40 mg daily and monitor renal function.  Continue Cipro for SBP prophylaxis after pharmacy med rec is done.  IR consulted for paracentesis, keep n.p.o. after midnight.  May need IV albumin.  Ammonia slightly elevated and close to baseline.  Continue lactulose after pharmacy med rec is done and monitor ammonia level.  Acute on chronic anemia Hemoglobin currently 7.9 and baseline 9-10.  Does have history of prior diverticular bleed but not having active GI bleed at this time as his FOBT is negative.  Hemodynamically stable.  Type and screen, monitor CBC.  Transfuse PRBCs if hemoglobin less than 7.  Anemia panel pending.  CKD stage 2-3a Renal function stable, continue to monitor.  COPD: Stable, no signs of acute exacerbation. Hypertension BPH History of CVA with residual left-sided weakness Dementia: Follow delirium precautions. Pharmacy med rec pending.  DVT prophylaxis: SCDs Code Status: Full Code per review of chart and paperwork from his nursing facility. Family Communication: No family available at this time. Level of care: Telemetry bed Admission status: It is my clinical opinion that admission to INPATIENT is reasonable and necessary because of the expectation that this patient will require hospital care that crosses at least 2 midnights to treat this condition based on the medical complexity of the problems presented.  Given the aforementioned information, the predictability of an adverse outcome is felt to be significant.   John Giovanni MD [facility]  If 7PM-7AM, please contact night-coverage [email or web address]  01/11/2023, 7:44 PM

## 2023-01-11 NOTE — ED Notes (Signed)
ED TO INPATIENT HANDOFF REPORT  ED Nurse Name and Phone #: [phone]  S Name/Age/Gender Jacob Bass 62 y.o. male Room/Bed: 016C/016C  Code Status   Code Status: Full Code  Home/SNF/Other Skilled nursing facility Patient oriented to: self and place Is this baseline? Yes   Triage Complete: Triage complete  Chief Complaint Ascites [R18.8]  Triage Note Pt bob ems due to abd swelling. Facility states pt was satting in 70% on 2L of o2. Pt has been in the 90% the whole time with EMS. Pt was scheduled for paracentesis. Lower extremity edema.    Allergies Allergies  Allergen Reactions   Penicillins Hives   Penicillins Other (See Comments)    Unknown reaction Listed on MAR    Level of Care/Admitting Diagnosis ED Disposition     ED Disposition  Admit   Condition  --   Comment  H[facility] Area: [facility] [[id number]]  Level of Care: Telemetry Medical [104]  May admit patient to [facility] or [facility] if equivalent level of care is available:: Yes  Covid Evaluation: Asymptomatic - no recent exposure (last 10 days) testing not required  Diagnosis: Ascites [743709]  Admitting Physician: John Giovanni [[id number]]  Attending Physician: John Giovanni [[id number]]  Certification:: I certify this patient will need inpatient services for at least 2 midnights  Estimated Length of Stay: 2          B Medical/Surgery History Past Medical History:  Diagnosis Date   CHF (congestive heart failure) (HCC)    Cirrhosis (HCC)    COPD (chronic obstructive pulmonary disease) (HCC)    ETOH abuse    Gastric ulcer    Hypertension    Stroke [facility])    Past Surgical History:  Procedure Laterality Date   BIOPSY  10/19/2018   Procedure: BIOPSY;  Surgeon: Benancio Deeds, MD;  Location: [facility] ENDOSCOPY;  Service: Gastroenterology;;   ESOPHAGOGASTRODUODENOSCOPY (EGD) WITH PROPOFOL N/A 10/19/2018   Procedure: ESOPHAGOGASTRODUODENOSCOPY (EGD);  Surgeon: Benancio Deeds, MD;  Location: [facility] ENDOSCOPY;  Service: Gastroenterology;  Laterality: N/A;   IR PARACENTESIS  09/19/2022   IR PARACENTESIS  09/25/2022   IR PARACENTESIS  10/03/2022   IR PARACENTESIS  10/11/2022   IR PARACENTESIS  10/24/2022   IR PARACENTESIS  10/30/2022   IR PARACENTESIS  12/24/2022     A IV Location/Drains/Wounds Patient Lines/Drains/Airways Status     Active Line/Drains/Airways     Name Placement date Placement time Site Days   Peripheral IV 01/11/23 22 G Anterior;Right Forearm 01/11/23  2030  Forearm  less than 1   Peripheral IV 01/11/23 20 G Anterior;Distal;Right;Upper Arm 01/11/23  2130  Arm  less than 1   Pressure Injury 09/24/22 Heel Left;Posterior Deep Tissue Pressure Injury - Purple or maroon localized area of discolored intact skin or blood-filled blister due to damage of underlying soft tissue from pressure and/or shear. Round, dark brown spot 09/24/22  0903  -- 109   Pressure Injury 10/02/22 Thigh Left;Posterior;Proximal Stage 2 -  Partial thickness loss of dermis presenting as a shallow open injury with a red, pink wound bed without slough. Pink 10/02/22  0700  -- 101   Wound / Incision (Open or Dehisced) 09/24/22 Irritant Dermatitis (Moisture Associated Skin Damage) Buttocks Distal Redness and excoriation inner buttocks, and thighs. 09/24/22  1400  Buttocks  109   Wound / Incision (Open or Dehisced) 09/29/22 Skin tear Knee Anterior;Left PInk and painful 09/29/22  0136  Knee  104   Wound / Incision (Open  or Dehisced) 09/29/22 Skin tear Knee Anterior;Left 09/29/22  0130  Knee  104   Wound / Incision (Open or Dehisced) 10/02/22 Non-pressure wound Abdomen Left;Lower Pink 10/02/22  0700  Abdomen  101            Intake/Output Last 24 hours No intake or output data in the 24 hours ending 01/11/23 2131  Labs/Imaging Results for orders placed or performed during the hospital encounter of 01/11/23 (from the past 48 hour(s))  CBC with Differential     Status: Abnormal    Collection Time: 01/11/23  5:47 PM  Result Value Ref Range   WBC 6.2 4.0 - 10.5 K/uL   RBC 2.45 (L) 4.22 - 5.81 MIL/uL   Hemoglobin 7.9 (L) 13.0 - 17.0 g/dL   HCT 86.5 (L) 78.4 - 69.6 %   MCV 99.6 80.0 - 100.0 fL   MCH 32.2 26.0 - 34.0 pg   MCHC 32.4 30.0 - 36.0 g/dL   RDW 29.5 (H) 28.4 - 13.2 %   Platelets 151 150 - 400 K/uL   nRBC 0.0 0.0 - 0.2 %   Neutrophils Relative % 55 %   Neutro Abs 3.5 1.7 - 7.7 K/uL   Lymphocytes Relative 21 %   Lymphs Abs 1.3 0.7 - 4.0 K/uL   Monocytes Relative 16 %   Monocytes Absolute 1.0 0.1 - 1.0 K/uL   Eosinophils Relative 6 %   Eosinophils Absolute 0.4 0.0 - 0.5 K/uL   Basophils Relative 1 %   Basophils Absolute 0.0 0.0 - 0.1 K/uL   Immature Granulocytes 1 %   Abs Immature Granulocytes 0.03 0.00 - 0.07 K/uL    Comment: Performed at [facility], [address], [place], [place] [address]  Brain natriuretic peptide     Status: None   Collection Time: 01/11/23  5:47 PM  Result Value Ref Range   B Natriuretic Peptide 59.1 0.0 - 100.0 pg/mL    Comment: Performed at [facility], [address] [place], [place] [address]  Comprehensive metabolic panel     Status: Abnormal   Collection Time: 01/11/23  5:47 PM  Result Value Ref Range   Sodium 133 (L) 135 - 145 mmol/L   Potassium 4.2 3.5 - 5.1 mmol/L   Chloride 103 98 - 111 mmol/L   CO2 23 22 - 32 mmol/L   Glucose, Bld 101 (H) 70 - 99 mg/dL    Comment: Glucose reference range applies only to samples taken after fasting for at least 8 hours.   BUN 16 8 - 23 mg/dL   Creatinine, Ser 6.64 0.61 - 1.24 mg/dL   Calcium 7.6 (L) 8.9 - 10.3 mg/dL   Total Protein 6.4 (L) 6.5 - 8.1 g/dL   Albumin 1.6 (L) 3.5 - 5.0 g/dL   AST 58 (H) 15 - 41 U/L   ALT 25 0 - 44 U/L   Alkaline Phosphatase 80 38 - 126 U/L   Total Bilirubin 1.4 (H) 0.3 - 1.2 mg/dL   GFR, Estimated >40 >34 mL/min    Comment: (NOTE) Calculated using the CKD-EPI Creatinine Equation (2021)    Anion gap 7 5 - 15    Comment:  Performed at [facility], [address] [place], [place] [address]  Protime-INR     Status: Abnormal   Collection Time: 01/11/23  5:47 PM  Result Value Ref Range   Prothrombin Time 19.5 (H) 11.4 - 15.2 seconds   INR 1.6 (H) 0.8 - 1.2  Comment: (NOTE) INR goal varies based on device and disease states. Performed at [facility], [address], [place], [place] [address]   Ammonia     Status: Abnormal   Collection Time: 01/11/23  5:47 PM  Result Value Ref Range   Ammonia 59 (H) 9 - 35 umol/L    Comment: HEMOLYSIS AT THIS LEVEL MAY AFFECT RESULT Performed at [facility], [address], [place], [place] [address]   Type and screen [facility]     Status: None (Preliminary result)   Collection Time: 01/11/23  6:41 PM  Result Value Ref Range   ABO/RH(D) O POS    Antibody Screen NEG    Sample Expiration 01/14/2023,2359    Unit Number [id number]    Blood Component Type RED CELLS,LR    Unit division 00    Status of Unit ISSUED    Transfusion Status OK TO TRANSFUSE    Crossmatch Result      Compatible Performed at [facility], [address] [place], [place] [address]   POC occult blood, ED Provider will collect     Status: None   Collection Time: 01/11/23  7:01 PM  Result Value Ref Range   Fecal Occult Bld NEGATIVE NEGATIVE  Prepare RBC (crossmatch)     Status: None   Collection Time: 01/11/23  7:09 PM  Result Value Ref Range   Order Confirmation      ORDERS RECEIVED TO CROSSMATCH Performed at [facility], [address], [place], [place] [address]   Iron and TIBC     Status: Abnormal   Collection Time: 01/11/23  7:24 PM  Result Value Ref Range   Iron 57 45 - 182 ug/dL    Comment: HEMOLYSIS AT THIS LEVEL MAY AFFECT RESULT   TIBC 126 (L) 250 - 450 ug/dL   Saturation Ratios 45 (H) 17.9 - 39.5 %   UIBC 69 ug/dL    Comment: Performed at [facility], [address] [place], [place] [address]  Ferritin      Status: Abnormal   Collection Time: 01/11/23  7:24 PM  Result Value Ref Range   Ferritin 342 (H) 24 - 336 ng/mL    Comment: Performed at [facility], [address], [place], [place] [address]  Reticulocytes     Status: Abnormal   Collection Time: 01/11/23  7:24 PM  Result Value Ref Range   Retic Ct Pct 1.9 0.4 - 3.1 %   RBC. 2.46 (L) 4.22 - 5.81 MIL/uL   Retic Count, Absolute 47.0 19.0 - 186.0 K/uL   Immature Retic Fract 10.1 2.3 - 15.9 %    Comment: Performed at [facility], [address], [place], [place] [address]   DG Chest Port 1 View  Result Date: 01/11/2023 CLINICAL DATA:  Shortness of breath EXAM: PORTABLE CHEST 1 VIEW COMPARISON:  10/28/2018 FINDINGS: Cardiac shadow is stable. The lungs are hypoinflated but clear. Previously seen left basilar changes have resolved in the interval. No new focal abnormality is seen. No bony changes are noted. IMPRESSION: No active disease. Electronically Signed   By: Alcide Clever M.D.   On: 01/11/2023 17:51    Pending Labs Unresulted Labs (From admission, onward)     Start     Ordered   01/12/23 0500  CBC  Tomorrow morning,   R        01/11/23 2046   01/12/23 0500  Basic  metabolic panel  Tomorrow morning,   R        01/11/23 2046   01/12/23 0500  Ammonia  Tomorrow morning,   R        01/11/23 2046   01/11/23 1914  Vitamin B12  (Anemia Panel (PNL))  Once,   AD        01/11/23 1913   01/11/23 1914  Folate  (Anemia Panel (PNL))  Once,   URGENT        01/11/23 1913            Vitals/Pain Today's Vitals   01/11/23 1930 01/11/23 2015 01/11/23 2019 01/11/23 2034  BP: (!) 165/74 (!) 152/82 (!) 152/82 (!) 159/93  Pulse:   92 88  Resp: 19 (!) 23 20 20   Temp:   97.7 F (36.5 C) 97.8 F (36.6 C)  TempSrc:    Oral  SpO2:   100% 100%    Isolation Precautions No active isolations  Medications Medications  furosemide (LASIX) injection 40 mg (has no administration in time range)  furosemide (LASIX) injection 20 mg (20  mg Intravenous Given 01/11/23 1851)  0.9 %  sodium chloride infusion (Manually program via Guardrails IV Fluids) ( Intravenous New Bag/Given 01/11/23 2027)    Mobility non-ambulatory     Focused Assessments Pulmonary Assessment Handoff:  Lung sounds: L Breath Sounds: Expiratory wheezes R Breath Sounds: Expiratory wheezes O2 Device: Nasal Cannula      R Recommendations: See Admitting Provider Note  Report given to:   Additional Notes:

## 2023-01-11 NOTE — Progress Notes (Addendum)
Patient arrived to the unit via stretcher accompanied by ED RN. Patient is A&Ox2 and is able to answer simple questions. patient was made comfortable in his room. Call bell is within reach, bed at lowest position.

## 2023-01-12 ENCOUNTER — Inpatient Hospital Stay (HOSPITAL_COMMUNITY): Payer: Medicaid Other

## 2023-01-12 DIAGNOSIS — K7031 Alcoholic cirrhosis of liver with ascites: Secondary | ICD-10-CM | POA: Diagnosis not present

## 2023-01-12 LAB — TYPE AND SCREEN
ABO/RH(D): O POS
Antibody Screen: NEGATIVE
Unit division: 0

## 2023-01-12 LAB — CBC
HCT: 29.8 % — ABNORMAL LOW (ref 39.0–52.0)
Hemoglobin: 10.3 g/dL — ABNORMAL LOW (ref 13.0–17.0)
MCH: 32.4 pg (ref 26.0–34.0)
MCHC: 34.6 g/dL (ref 30.0–36.0)
MCV: 93.7 fL (ref 80.0–100.0)
Platelets: 87 10*3/uL — ABNORMAL LOW (ref 150–400)
RBC: 3.18 MIL/uL — ABNORMAL LOW (ref 4.22–5.81)
RDW: 17.2 % — ABNORMAL HIGH (ref 11.5–15.5)
WBC: 6.2 10*3/uL (ref 4.0–10.5)
nRBC: 0 % (ref 0.0–0.2)

## 2023-01-12 LAB — GLUCOSE, CAPILLARY: Glucose-Capillary: 99 mg/dL (ref 70–99)

## 2023-01-12 LAB — BASIC METABOLIC PANEL
Anion gap: 8 (ref 5–15)
BUN: 14 mg/dL (ref 8–23)
CO2: 22 mmol/L (ref 22–32)
Calcium: 7.7 mg/dL — ABNORMAL LOW (ref 8.9–10.3)
Chloride: 105 mmol/L (ref 98–111)
Creatinine, Ser: 1.01 mg/dL (ref 0.61–1.24)
GFR, Estimated: >60 mL/min (ref >60)
Glucose, Bld: 69 mg/dL — ABNORMAL LOW (ref 70–99)
Potassium: 4 mmol/L (ref 3.5–5.1)
Sodium: 135 mmol/L (ref 135–145)

## 2023-01-12 LAB — BPAM RBC
Blood Product Expiration Date: 202406092359
ISSUE DATE / TIME: 202405102008
Unit Type and Rh: 5100

## 2023-01-12 LAB — AMMONIA: Ammonia: 71 umol/L — ABNORMAL HIGH (ref 9–35)

## 2023-01-12 MED ORDER — FUROSEMIDE 10 MG/ML IJ SOLN
40.0000 mg | Freq: Two times a day (BID) | INTRAMUSCULAR | Status: DC
  Administered 2023-01-12 (×2): 40 mg via INTRAVENOUS
  Filled 2023-01-12 (×2): qty 4

## 2023-01-12 MED ORDER — GERHARDT'S BUTT CREAM: 1.0000 | TOPICAL_CREAM | Freq: Four times a day (QID) | CUTANEOUS | Status: DC | PRN

## 2023-01-12 MED ORDER — MAGNESIUM OXIDE -MG SUPPLEMENT 400 (240 MG) MG PO TABS
400.0000 mg | ORAL_TABLET | Freq: Three times a day (TID) | ORAL | Status: DC
  Administered 2023-01-12 – 2023-01-16 (×12): 400 mg via ORAL
  Filled 2023-01-12 (×13): qty 1

## 2023-01-12 MED ORDER — MOMETASONE FURO-FORMOTEROL FUM 200-5 MCG/ACT IN AERO
2.0000 | INHALATION_SPRAY | Freq: Two times a day (BID) | RESPIRATORY_TRACT | Status: DC
  Administered 2023-01-12 – 2023-01-16 (×9): 2 via RESPIRATORY_TRACT
  Filled 2023-01-12: qty 8.8

## 2023-01-12 MED ORDER — TAMSULOSIN HCL 0.4 MG PO CAPS
0.4000 mg | ORAL_CAPSULE | Freq: Every morning | ORAL | Status: DC
  Administered 2023-01-12 – 2023-01-16 (×5): 0.4 mg via ORAL
  Filled 2023-01-12 (×5): qty 1

## 2023-01-12 MED ORDER — ENSURE ENLIVE PO LIQD
237.0000 mL | Freq: Every day | ORAL | Status: DC
  Administered 2023-01-14 – 2023-01-15 (×2): 237 mL via ORAL

## 2023-01-12 MED ORDER — VITAMIN B-12 1000 MCG PO TABS
1000.0000 ug | ORAL_TABLET | Freq: Every morning | ORAL | Status: DC
  Administered 2023-01-12 – 2023-01-16 (×5): 1000 ug via ORAL
  Filled 2023-01-12 (×5): qty 1

## 2023-01-12 MED ORDER — PANTOPRAZOLE SODIUM 20 MG PO TBEC
20.0000 mg | DELAYED_RELEASE_TABLET | Freq: Every day | ORAL | Status: DC
  Administered 2023-01-12 – 2023-01-16 (×5): 20 mg via ORAL
  Filled 2023-01-12 (×5): qty 1

## 2023-01-12 MED ORDER — DEXTROSE 50 % IV SOLN: 1.0000 | Freq: Once | INTRAVENOUS | Status: DC

## 2023-01-12 MED ORDER — CIPROFLOXACIN HCL 500 MG PO TABS
500.0000 mg | ORAL_TABLET | Freq: Every day | ORAL | Status: DC
  Administered 2023-01-12 – 2023-01-15 (×4): 500 mg via ORAL
  Filled 2023-01-12 (×5): qty 1

## 2023-01-12 MED ORDER — DEXTROSE 50 % IV SOLN
1.0000 | Freq: Once | INTRAVENOUS | Status: AC
  Administered 2023-01-12: 50 mL via INTRAVENOUS
  Filled 2023-01-12: qty 50

## 2023-01-12 MED ORDER — MELATONIN 5 MG PO TABS
5.0000 mg | ORAL_TABLET | Freq: Every day | ORAL | Status: DC
  Administered 2023-01-12 – 2023-01-15 (×4): 5 mg via ORAL
  Filled 2023-01-12 (×4): qty 1

## 2023-01-12 MED ORDER — LATANOPROST 0.005 % OP SOLN
1.0000 [drp] | Freq: Every day | OPHTHALMIC | Status: DC
  Administered 2023-01-12 – 2023-01-15 (×4): 1 [drp] via OPHTHALMIC
  Filled 2023-01-12: qty 2.5

## 2023-01-12 MED ORDER — LACTULOSE 10 GM/15ML PO SOLN
10.0000 g | Freq: Two times a day (BID) | ORAL | Status: DC
  Administered 2023-01-12 – 2023-01-16 (×8): 10 g via ORAL
  Filled 2023-01-12 (×9): qty 15

## 2023-01-12 MED ORDER — OMEPRAZOLE MAGNESIUM 20 MG PO TBEC: 40.0000 mg | DELAYED_RELEASE_TABLET | Freq: Two times a day (BID) | ORAL | Status: DC

## 2023-01-12 MED ORDER — ACETAMINOPHEN 325 MG PO TABS: 650.0000 mg | ORAL_TABLET | Freq: Four times a day (QID) | ORAL | Status: DC | PRN

## 2023-01-12 MED ORDER — LIDOCAINE HCL (PF) 1 % IJ SOLN
10.0000 mL | Freq: Once | INTRAMUSCULAR | Status: AC
  Administered 2023-01-12: 10 mL via INTRADERMAL

## 2023-01-12 MED ORDER — TIOTROPIUM BROMIDE MONOHYDRATE 18 MCG IN CAPS: 18.0000 ug | ORAL_CAPSULE | Freq: Every day | RESPIRATORY_TRACT | Status: DC

## 2023-01-12 MED ORDER — UMECLIDINIUM BROMIDE 62.5 MCG/ACT IN AEPB
1.0000 | INHALATION_SPRAY | Freq: Every day | RESPIRATORY_TRACT | Status: DC
  Administered 2023-01-12 – 2023-01-16 (×5): 1 via RESPIRATORY_TRACT
  Filled 2023-01-12: qty 7

## 2023-01-12 MED ORDER — FOLIC ACID 1 MG PO TABS
1.0000 mg | ORAL_TABLET | Freq: Every day | ORAL | Status: DC
  Administered 2023-01-12 – 2023-01-16 (×5): 1 mg via ORAL
  Filled 2023-01-12 (×5): qty 1

## 2023-01-12 NOTE — Procedures (Signed)
   US guided RLQ paracentesis  Tolerated well 4 L clear yellow fluid  No labs per MD  EBL: less than 1 cc

## 2023-01-12 NOTE — Progress Notes (Signed)
   US paracentesis requested   Pt is unable to consent self and has no family. Will require ordering MD to sign medical necessity consent for procedure  RN aware  They are to call me when consent is obtained.

## 2023-01-12 NOTE — Progress Notes (Signed)
Jacob Bass  WUJ:[id number] DOB: [birth year] DOA: 01/11/2023 PCP: Donia Ast., MD    Brief Narrative:  62 year old with a history of alcoholic cirrhosis of the liver, hepatitis C, prior SBP, portal gastropathy, chronic thrombocytopenia, pleural effusions, CVA with residual left-sided weakness, chronic anemia, CKD stage IIIb, COPD, HTN, BPH, and dementia who was transported to the ER from his SNF with worsening abdominal distention and generalized edema.  He last underwent a paracentesis 12/24/2022.  His facility was reportedly concern that he was hypoxic, but EMS found his saturation to be 90% on room air.  In the ER CXR revealed no acute disease.  Exam revealed significant abdominal distention and anasarca.  Of note at baseline the patient is reportedly oriented to person and place only due to his dementia.  Consultants:  IR  Goals of Care:  Code Status: Full Code   DVT prophylaxis: SCDs  Interim Hx: Afebrile since admission.  Vital signs stable.  Oxygen saturation 100% on room air.  Hemoglobin has improved with diuresis.  The patient is alert and conversant at the time of my evaluation.  I have explained to him that he needs another paracentesis due to his distended abdomen with ascites and he voices understanding.  I have explained the procedure and the risk to him and he voices understanding and is willing to proceed.  Multiple attempts were made to contact the patient's legal guardian by nursing staff in good faith and contact was not able to be made.  Following my discussion with the patient, his consent directly for the procedure, and as his attending physician recognizing that this procedure was necessary to affect his acute improvement I signed his consent form on an emergency basis.  It was my feeling that failure to do so would lead to continued worsening of his ascites compounding his respiratory status and potentially leading to acute decline.  Assessment & Plan:  Decompensated  alcoholic cirrhosis of the liver with recurrent ascites and anasarca Continue IV diuresis -IR to perform therapeutic paracentesis -continue usual cirrhosis medications to include lactulose  Hepatitis C  Chronic anemia of cirrhosis and CKD Baseline hemoglobin 9-10 -suspect 7.9 at presentation is due to dilution in the setting of gross volume overload -no evidence of active bleeding at present -guaiac negative -monitor hemoglobin with diuresis -iron levels are not low but as expected TIBC is low   CKD stage IIIb Renal function stable at present  Dementia Reportedly at baseline is only oriented to person and place  COPD Well compensated at present  HTN  BPH  History of CVA with residual left-sided weakness  Family Communication: Legal guardian not responsive to multiple attempts to make contact Disposition: Return to SNF when medically stable -likely will be ready 5/13  Objective: Blood pressure 129/77, pulse 81, temperature 97.7 F (36.5 C), resp. rate 18, weight 79 kg, SpO2 100 %.  Intake/Output Summary (Last 24 hours) at 01/12/2023 0900 Last data filed at 01/11/2023 2224 Gross per 24 hour  Intake 250 ml  Output --  Net 250 ml   Filed Weights   01/12/23 0509  Weight: 79 kg    Examination: General: No acute respiratory distress Lungs: Clear to auscultation bilaterally without wheezes or crackles Cardiovascular: Regular rate and rhythm without murmur gallop or rub normal S1 and S2 Abdomen: Distended/protuberant, soft, no rebound, obvious fluid wave, no appreciable mass Extremities: 2+ diffuse edema/anasarca  CBC: Recent Labs  Lab 01/11/23 1747 01/12/23 0435  WBC 6.2 6.2  NEUTROABS 3.5  --  HGB 7.9* 10.3*  HCT 24.4* 29.8*  MCV 99.6 93.7  PLT 151 87*   Basic Metabolic Panel: Recent Labs  Lab 01/11/23 1747 01/12/23 0435  NA 133* 135  K 4.2 4.0  CL 103 105  CO2 23 22  GLUCOSE 101* 69*  BUN 16 14  CREATININE 1.07 1.01  CALCIUM 7.6* 7.7*    GFR: Estimated Creatinine Clearance: 68.7 mL/min (by C-G formula based on SCr of 1.01 mg/dL).   Scheduled Meds:  furosemide  40 mg Intravenous Daily     LOS: 1 day   [name], MD [facility] Office  [phone] Pager - Text Page per Amion  If 7PM-7AM, please contact night-coverage per Amion 01/12/2023, 9:00 AM

## 2023-01-13 DIAGNOSIS — K7031 Alcoholic cirrhosis of liver with ascites: Secondary | ICD-10-CM | POA: Diagnosis not present

## 2023-01-13 LAB — COMPREHENSIVE METABOLIC PANEL
ALT: 22 U/L (ref 0–44)
AST: 42 U/L — ABNORMAL HIGH (ref 15–41)
Albumin: <1.5 g/dL — ABNORMAL LOW (ref 3.5–5.0)
Alkaline Phosphatase: 60 U/L (ref 38–126)
Anion gap: 6 (ref 5–15)
BUN: 15 mg/dL (ref 8–23)
CO2: 24 mmol/L (ref 22–32)
Calcium: 7.4 mg/dL — ABNORMAL LOW (ref 8.9–10.3)
Chloride: 105 mmol/L (ref 98–111)
Creatinine, Ser: 1.07 mg/dL (ref 0.61–1.24)
GFR, Estimated: >60 mL/min (ref >60)
Glucose, Bld: 82 mg/dL (ref 70–99)
Potassium: 3.7 mmol/L (ref 3.5–5.1)
Sodium: 135 mmol/L (ref 135–145)
Total Bilirubin: 1.8 mg/dL — ABNORMAL HIGH (ref 0.3–1.2)
Total Protein: 5.7 g/dL — ABNORMAL LOW (ref 6.5–8.1)

## 2023-01-13 LAB — PHOSPHORUS: Phosphorus: 3.3 mg/dL (ref 2.5–4.6)

## 2023-01-13 LAB — CBC
HCT: 27.8 % — ABNORMAL LOW (ref 39.0–52.0)
Hemoglobin: 9.5 g/dL — ABNORMAL LOW (ref 13.0–17.0)
MCH: 32 pg (ref 26.0–34.0)
MCHC: 34.2 g/dL (ref 30.0–36.0)
MCV: 93.6 fL (ref 80.0–100.0)
Platelets: 97 10*3/uL — ABNORMAL LOW (ref 150–400)
RBC: 2.97 MIL/uL — ABNORMAL LOW (ref 4.22–5.81)
RDW: 17.2 % — ABNORMAL HIGH (ref 11.5–15.5)
WBC: 5.6 10*3/uL (ref 4.0–10.5)
nRBC: 0 % (ref 0.0–0.2)

## 2023-01-13 LAB — MAGNESIUM: Magnesium: 1.8 mg/dL (ref 1.7–2.4)

## 2023-01-13 MED ORDER — ACETAMINOPHEN 500 MG PO TABS: 500.0000 mg | ORAL_TABLET | Freq: Four times a day (QID) | ORAL | Status: DC | PRN

## 2023-01-13 MED ORDER — FUROSEMIDE 40 MG PO TABS
40.0000 mg | ORAL_TABLET | Freq: Two times a day (BID) | ORAL | Status: DC
  Administered 2023-01-13 (×2): 40 mg via ORAL
  Filled 2023-01-13 (×2): qty 1

## 2023-01-13 NOTE — Plan of Care (Signed)

## 2023-01-13 NOTE — Progress Notes (Signed)
Jacob Bass  ZOX:[id number] DOB: 07/17/1961 DOA: 01/11/2023 PCP: Donia Ast., MD    Brief Narrative:  62 year old with a history of alcoholic cirrhosis of the liver, hepatitis C, prior SBP, portal gastropathy, chronic thrombocytopenia, pleural effusions, CVA with residual left-sided weakness, chronic anemia, CKD stage IIIb, COPD, HTN, BPH, and dementia who was transported to the ER from his SNF with worsening abdominal distention and generalized edema.  He last underwent a paracentesis 12/24/2022.  His facility was reportedly concerned that he was hypoxic, but EMS found his saturation to be 90% on room air.  In the ER CXR revealed no acute disease.  Exam revealed significant abdominal distention and anasarca.  Of note at baseline the patient is reportedly oriented to person and place only due to his dementia.  Consultants:  IR  Goals of Care:  Code Status: Full Code   DVT prophylaxis: SCDs  Interim Hx: No acute events recorded overnight.  Has been afebrile since admission.  Vital signs are stable.  Resting comfortably in bed with no complaints.  Denies shortness of breath or chest pain.  Reports good appetite.  Is pleasant and interactive.  Assessment & Plan:  Decompensated alcoholic cirrhosis of the liver with recurrent ascites and anasarca Managed with IV diuresis initially - IR completed a therapeutic paracentesis 5/11 removing 4 L of benign-appearing fluid - continue usual cirrhosis medications to include lactulose -transitioned back to oral diuretic 5/12 - net negative approximately 4500 cc including ascitic fluid  Hepatitis C  Chronic anemia of cirrhosis and CKD Baseline hemoglobin 9-10 -suspect 7.9 at presentation is due to dilution in the setting of gross volume overload -no evidence of active bleeding at present -guaiac negative - iron levels are not low but as expected TIBC is low - hemoglobin has improved with diuresis and 1U PRBC transfused by admitting MD   CKD stage  IIIb Renal function stable at present  Dementia Reportedly at baseline is only oriented to person and place -appears to be at his baseline at present  COPD Well compensated at present  HTN Blood pressure well-controlled  BPH Asymptomatic  History of CVA with residual left-sided weakness  Family Communication: Legal guardian not responsive to multiple attempts to make contact Disposition: Now medically stable to return to SNF  Objective: Blood pressure 123/66, pulse 88, temperature 98.1 F (36.7 C), temperature source Oral, resp. rate 16, weight 79 kg, SpO2 100 %.  Intake/Output Summary (Last 24 hours) at 01/13/2023 0849 Last data filed at 01/13/2023 0400 Gross per 24 hour  Intake 236 ml  Output 700 ml  Net -464 ml    Filed Weights   01/12/23 0509  Weight: 79 kg    Examination: General: No acute respiratory distress Lungs: Clear to auscultation bilaterally without wheezes or crackles Cardiovascular: Regular rate and rhythm without murmur gallop or rub normal S1 and S2 Abdomen: Distended/protuberant, soft, no rebound, obvious fluid wave, no appreciable mass Extremities: 2+ diffuse bilateral lower extremity edema but anasarca now resolved  CBC: Recent Labs  Lab 01/11/23 1747 01/12/23 0435 01/13/23 0440  WBC 6.2 6.2 5.6  NEUTROABS 3.5  --   --   HGB 7.9* 10.3* 9.5*  HCT 24.4* 29.8* 27.8*  MCV 99.6 93.7 93.6  PLT 151 87* 97*    Basic Metabolic Panel: Recent Labs  Lab 01/11/23 1747 01/12/23 0435 01/13/23 0440  NA 133* 135 135  K 4.2 4.0 3.7  CL 103 105 105  CO2 23 22 24   GLUCOSE 101* 69* 82  BUN 16 14 15   CREATININE 1.07 1.01 1.07  CALCIUM 7.6* 7.7* 7.4*  MG  --   --  1.8  PHOS  --   --  3.3    GFR: Estimated Creatinine Clearance: 64.8 mL/min (by C-G formula based on SCr of 1.07 mg/dL).   Scheduled Meds:  ciprofloxacin  500 mg Oral Q2000   cyanocobalamin  1,000 mcg Oral q AM   feeding supplement  237 mL Oral Q1500   folic acid  1 mg Oral  Daily   furosemide  40 mg Intravenous Q12H   lactulose  10 g Oral BID   latanoprost  1 drop Both Eyes QHS   magnesium oxide  400 mg Oral TID   melatonin  5 mg Oral QHS   mometasone-formoterol  2 puff Inhalation BID   pantoprazole  20 mg Oral Daily   tamsulosin  0.4 mg Oral q AM   umeclidinium bromide  1 puff Inhalation Daily     LOS: 2 days   [name], MD [facility] Office  [phone] Pager - Text Page per Loretha Stapler  If 7PM-7AM, please contact night-coverage per Amion 01/13/2023, 8:49 AM

## 2023-01-14 DIAGNOSIS — K7031 Alcoholic cirrhosis of liver with ascites: Secondary | ICD-10-CM | POA: Diagnosis not present

## 2023-01-14 LAB — BASIC METABOLIC PANEL
Anion gap: 4 — ABNORMAL LOW (ref 5–15)
BUN: 15 mg/dL (ref 8–23)
CO2: 23 mmol/L (ref 22–32)
Calcium: 7.7 mg/dL — ABNORMAL LOW (ref 8.9–10.3)
Chloride: 107 mmol/L (ref 98–111)
Creatinine, Ser: 0.98 mg/dL (ref 0.61–1.24)
GFR, Estimated: >60 mL/min (ref >60)
Glucose, Bld: 93 mg/dL (ref 70–99)
Potassium: 3.8 mmol/L (ref 3.5–5.1)
Sodium: 134 mmol/L — ABNORMAL LOW (ref 135–145)

## 2023-01-14 LAB — CBC
HCT: 29.3 % — ABNORMAL LOW (ref 39.0–52.0)
Hemoglobin: 9.9 g/dL — ABNORMAL LOW (ref 13.0–17.0)
MCH: 32.8 pg (ref 26.0–34.0)
MCHC: 33.8 g/dL (ref 30.0–36.0)
MCV: 97 fL (ref 80.0–100.0)
Platelets: 90 10*3/uL — ABNORMAL LOW (ref 150–400)
RBC: 3.02 MIL/uL — ABNORMAL LOW (ref 4.22–5.81)
RDW: 17.2 % — ABNORMAL HIGH (ref 11.5–15.5)
WBC: 5.6 10*3/uL (ref 4.0–10.5)
nRBC: 0 % (ref 0.0–0.2)

## 2023-01-14 LAB — MAGNESIUM: Magnesium: 1.8 mg/dL (ref 1.7–2.4)

## 2023-01-14 MED ORDER — SPIRONOLACTONE 25 MG PO TABS: 25.0000 mg | ORAL_TABLET | Freq: Every day | ORAL | Status: DC

## 2023-01-14 MED ORDER — SPIRONOLACTONE 25 MG PO TABS
100.0000 mg | ORAL_TABLET | Freq: Every day | ORAL | Status: DC
  Administered 2023-01-14 – 2023-01-16 (×3): 100 mg via ORAL
  Filled 2023-01-14 (×3): qty 4

## 2023-01-14 MED ORDER — FUROSEMIDE 40 MG PO TABS: 60.0000 mg | ORAL_TABLET | Freq: Two times a day (BID) | ORAL | Status: DC

## 2023-01-14 MED ORDER — FUROSEMIDE 40 MG PO TABS
80.0000 mg | ORAL_TABLET | Freq: Every day | ORAL | Status: DC
  Administered 2023-01-14 – 2023-01-15 (×2): 80 mg via ORAL
  Filled 2023-01-14 (×2): qty 2

## 2023-01-14 NOTE — Progress Notes (Signed)
Jacob Bass  WUJ:[id number] DOB: 05-14-1961 DOA: 01/11/2023 PCP: Donia Ast., MD    Brief Narrative:  62 year old with a history of alcoholic cirrhosis of the liver, hepatitis C, prior SBP, portal gastropathy, chronic thrombocytopenia, pleural effusions, CVA with residual left-sided weakness, chronic anemia, CKD stage IIIb, COPD, HTN, BPH, and dementia who was transported to the ER from his SNF with worsening abdominal distention and generalized edema.  He last underwent a paracentesis 12/24/2022.  His facility was reportedly concerned that he was hypoxic, but EMS found his saturation to be 90% on room air.  In the ER CXR revealed no acute disease.  Exam revealed significant abdominal distention and anasarca.  Of note at baseline the patient is reportedly oriented to person and place only due to his dementia.  Consultants:  IR  Goals of Care:  Code Status: Full Code   DVT prophylaxis: SCDs  Interim Hx: Afebrile.  Vital signs stable.  No acute events recorded overnight.  Has stabilized medically.  In good spirits.  Pleasant and interactive.  Reports good appetite.  No new complaints.  Assessment & Plan:  Decompensated alcoholic cirrhosis of the liver with recurrent ascites and anasarca Managed with IV diuresis initially - IR completed a therapeutic paracentesis 5/11 removing 4 L of benign-appearing fluid - continue usual cirrhosis medications to include lactulose -transitioned back to oral diuretic 5/12 - net negative approximately 5000 cc thus far including ascitic fluid -as patient has refractory recurrent ascites will avoid beta-blocker use -added Aldactone -attempt to avoid hypotension  Hepatitis C No acute issues  Chronic anemia of cirrhosis and CKD Baseline hemoglobin 9-10 -suspect 7.9 at presentation was due to dilution in the setting of gross volume overload -no evidence of active bleeding at present -guaiac negative - iron levels are not low but as expected TIBC is low -  hemoglobin has improved with diuresis and 1U PRBC transfused by admitting MD   CKD stage IIIb Renal function stable at present  Dementia Reportedly at baseline is only oriented to person and place -appears to be at his baseline at present  COPD Well compensated at present  HTN Blood pressure well-controlled  BPH Asymptomatic  History of CVA with residual left-sided weakness  Family Communication: Legal guardian not responsive to multiple attempts to make contact Disposition: Now medically stable to return to SNF  Objective: Blood pressure 128/78, pulse 74, temperature 97.8 F (36.6 C), resp. rate 18, weight 79 kg, SpO2 97 %.  Intake/Output Summary (Last 24 hours) at 01/14/2023 0754 Last data filed at 01/14/2023 0510 Gross per 24 hour  Intake 0 ml  Output 250 ml  Net -250 ml    Filed Weights   01/12/23 0509  Weight: 79 kg    Examination: General: No acute respiratory distress Lungs: Clear to auscultation bilaterally without wheezes or crackles Cardiovascular: Regular rate and rhythm without murmur gallop or rub normal S1 and S2 Abdomen: Remains protuberant with ascites, soft, no rebound, no appreciable mass Extremities: 1+ diffuse bilateral lower extremity edema   CBC: Recent Labs  Lab 01/11/23 1747 01/12/23 0435 01/13/23 0440 01/14/23 0430  WBC 6.2 6.2 5.6 5.6  NEUTROABS 3.5  --   --   --   HGB 7.9* 10.3* 9.5* 9.9*  HCT 24.4* 29.8* 27.8* 29.3*  MCV 99.6 93.7 93.6 97.0  PLT 151 87* 97* 90*    Basic Metabolic Panel: Recent Labs  Lab 01/12/23 0435 01/13/23 0440 01/14/23 0430  NA 135 135 134*  K 4.0 3.7 3.8  CL 105 105 107  CO2 22 24 23   GLUCOSE 69* 82 93  BUN 14 15 15   CREATININE 1.01 1.07 0.98  CALCIUM 7.7* 7.4* 7.7*  MG  --  1.8 1.8  PHOS  --  3.3  --     GFR: Estimated Creatinine Clearance: 70.8 mL/min (by C-G formula based on SCr of 0.98 mg/dL).   Scheduled Meds:  ciprofloxacin  500 mg Oral Q2000   cyanocobalamin  1,000 mcg Oral q AM    feeding supplement  237 mL Oral Q1500   folic acid  1 mg Oral Daily   furosemide  40 mg Oral BID   lactulose  10 g Oral BID   latanoprost  1 drop Both Eyes QHS   magnesium oxide  400 mg Oral TID   melatonin  5 mg Oral QHS   mometasone-formoterol  2 puff Inhalation BID   pantoprazole  20 mg Oral Daily   tamsulosin  0.4 mg Oral q AM   umeclidinium bromide  1 puff Inhalation Daily     LOS: 3 days   [name], MD [facility] Office  [phone] Pager - Text Page per Loretha Stapler  If 7PM-7AM, please contact night-coverage per Amion 01/14/2023, 7:54 AM

## 2023-01-14 NOTE — Progress Notes (Signed)
Received patient at 1:24 assessments incomplete

## 2023-01-14 NOTE — Evaluation (Signed)
Occupational Therapy Evaluation Patient Details Name: Jacob Bass MRN: [id number] DOB: December 10, 1960 Today's Date: 01/14/2023   History of Present Illness 62 year old who was transported to the ER from his SNF with worsening abdominal distention and generalized edema.  CXR revealed no acute disease. Pt diagnosed with decompensated alcoholic cirrhosis of the liver with recurrent ascites and anasarca.  PMH includes CVA, CHF, COPD, cirrhosis, gastric ulcer, and dementia.   Clinical Impression   Pt currently at max to total assist level for supine to sit.  Max assist for sitting balance with attempted partial stand of total assist for simulated toileting and LB selfcare.  Pt poor historian so unsure of how much assist nursing home was giving him.  Per previous notes he needed max to total assist for selfcare and transfers and min to max for self feeding.  Recommend OT on trial basis to progress basic selfcare tasks to a more independent level for return back to the SNF with continued rehab.       Recommendations for follow up therapy are one component of a multi-disciplinary discharge planning process, led by the attending physician.  Recommendations may be updated based on patient status, additional functional criteria and insurance authorization.   Assistance Recommended at Discharge Frequent or constant Supervision/Assistance  Patient can return home with the following Two people to help with walking and/or transfers;Two people to help with bathing/dressing/bathroom;Direct supervision/assist for medications management;Help with stairs or ramp for entrance;Assistance with feeding;Assist for transportation;Assistance with cooking/housework;Direct supervision/assist for financial management    Functional Status Assessment  Patient has had a recent decline in their functional status and demonstrates the ability to make significant improvements in function in a reasonable and predictable amount of time.   Equipment Recommendations  None recommended by OT       Precautions / Restrictions Precautions Precautions: Fall Precaution Comments: left hemi paresis with increased tone in the LUE Restrictions Weight Bearing Restrictions: No      Mobility Bed Mobility Overal bed mobility: Needs Assistance Bed Mobility: Rolling, Sidelying to Sit, Sit to Supine Rolling: Mod assist Sidelying to sit: Max assist   Sit to supine: Max assist   General bed mobility comments: Pt needed max assist with all rolling to the right and then bringing LEs off the bed and trunk upright.  Total assist to return to supine from sitting.  Once in the bed he was able to use the bedrails to roll to the right at max assist and to the left at min to mod.  Mod demonstrational cueing to complete as he wants to slide himself up to the left top corner of the bed instead of rolling.    Transfers                          Balance Overall balance assessment: Needs assistance Sitting-balance support: Feet supported Sitting balance-Leahy Scale: Poor Sitting balance - Comments: Posterior LOB     Standing balance-Leahy Scale: Zero Standing balance comment: Unable to achieve full standing with total assist from therapist.                           ADL either performed or assessed with clinical judgement   ADL Overall ADL's : Needs assistance/impaired Eating/Feeding: Minimal assistance;Bed level   Grooming: Wash/dry hands;Wash/dry face;Bed level;Minimal assistance  Functional mobility during ADLs: Maximal assistance (supine to sit EOB) General ADL Comments: Pt noted in bed eating with food all over him andhis HOB layed down less than 25 degrees.  Pt also layying diagonal.  Helped reposition him and then worked on rolling to transition to EOB.  Pt with noted bowel incontinence which therapist cleaned up and then had him transition to sitting.  He needed max  assist for sitting balance secondary to posterior lean and abdominal distension.  Attempted standing X2 but unsuccessful with pt only making 1/2-3/4 of stand with total assist.  Pt transferred back to supine with max assist and worked on rolling in the bed to continue cleanup and changing out sheets and gown. Nursing made aware to help position pt in bed for meals and provide occasional PRN supervision to check on him.     Vision Baseline Vision/History: 0 No visual deficits (per patient report) Ability to See in Adequate Light: 0 Adequate Patient Visual Report: No change from baseline;Other (comment) (per patient report) Vision Assessment?: Vision impaired- to be further tested in functional context     Perception  Not tested   Praxis  Not tested    Pertinent Vitals/Pain Pain Assessment Pain Assessment: Faces Faces Pain Scale: No hurt     Hand Dominance Right   Extremity/Trunk Assessment Upper Extremity Assessment Upper Extremity Assessment: LUE deficits/detail LUE Deficits / Details: Pt with increased tone at wrist flexors 2/4 and elbow flexors 4/4 with passive ROM elbow flexion/extension from starting point of 130 degrees flexion down to 90 degrees and then tone too increased with pain noted.  Shoulder flexion PROM limited to less than 60 degrees secondary to pain.  Fingers in extension with some occasional spontaneous movement seen but unable to consistently activate to command. LUE: Shoulder pain with ROM LUE Sensation:  (unable to determine) LUE Coordination: decreased gross motor;decreased fine motor   Lower Extremity Assessment Lower Extremity Assessment: Defer to PT evaluation   Cervical / Trunk Assessment Cervical / Trunk Assessment: Kyphotic   Communication Communication Communication: Expressive difficulties;Other (comment) (dysarthia)   Cognition Arousal/Alertness: Awake/alert Behavior During Therapy: Restless Overall Cognitive Status: History of cognitive  impairments - at baseline                                 General Comments: Pt likely at baseline for cognition.  He was oriented to place, month, and day of the week.                Home Living Family/patient expects to be discharged to:: Skilled nursing facility                                 Additional Comments: From [facility] Place per chart      Prior Functioning/Environment Prior Level of Function : Patient poor historian/Family not available             Mobility Comments: Per older notes, pt was total assist for mobility via wheelchair ADLs Comments: Per previos notes, he was total A for all basic ADLs with min to max assist for self feeding.        OT Problem List: Decreased strength;Decreased range of motion;Impaired balance (sitting and/or standing);Decreased cognition;Pain;Decreased knowledge of use of DME or AE      OT Treatment/Interventions: Self-care/ADL training;Patient/family education;Balance training;Neuromuscular education;Therapeutic activities;DME and/or AE instruction;Cognitive remediation/compensation;Therapeutic exercise;Manual  therapy;Splinting    OT Goals(Current goals can be found in the care plan section) Acute Rehab OT Goals Patient Stated Goal: Pt did not state during session OT Goal Formulation: With patient Time For Goal Achievement: 01/28/23 Potential to Achieve Goals: Good  OT Frequency: Min 2X/week       AM-PAC OT "6 Clicks" Daily Activity     Outcome Measure Help from another person eating meals?: A Lot Help from another person taking care of personal grooming?: A Lot Help from another person toileting, which includes using toliet, bedpan, or urinal?: Total Help from another person bathing (including washing, rinsing, drying)?: Total Help from another person to put on and taking off regular upper body clothing?: A Lot Help from another person to put on and taking off regular lower body clothing?:  Total 6 Click Score: 9   End of Session Nurse Communication: Mobility status;Other (comment) (need for intermittent assist with meals)  Activity Tolerance: Patient tolerated treatment well Patient left: in bed;with call bell/phone within reach;with bed alarm set  OT Visit Diagnosis: Unsteadiness on feet (R26.81);Muscle weakness (generalized) (M62.81);Hemiplegia and hemiparesis;Feeding difficulties (R63.3);Other symptoms and signs involving cognitive function Hemiplegia - Right/Left: Left Hemiplegia - dominant/non-dominant: Non-Dominant Hemiplegia - caused by: Cerebral infarction                Time: 1400-1435 OT Time Calculation (min): 35 min Charges:  OT General Charges $OT Visit: 1 Visit OT Evaluation $OT Eval Moderate Complexity: 1 Mod OT Treatments $Self Care/Home Management : 8-22 mins  Perrin Maltese, OTR/L Acute Rehabilitation Services  Office [phone] 01/14/2023

## 2023-01-15 DIAGNOSIS — K7031 Alcoholic cirrhosis of liver with ascites: Secondary | ICD-10-CM | POA: Diagnosis not present

## 2023-01-15 LAB — BASIC METABOLIC PANEL
Anion gap: 9 (ref 5–15)
BUN: 19 mg/dL (ref 8–23)
CO2: 23 mmol/L (ref 22–32)
Calcium: 8 mg/dL — ABNORMAL LOW (ref 8.9–10.3)
Chloride: 105 mmol/L (ref 98–111)
Creatinine, Ser: 1.17 mg/dL (ref 0.61–1.24)
GFR, Estimated: >60 mL/min (ref >60)
Glucose, Bld: 84 mg/dL (ref 70–99)
Potassium: 4.2 mmol/L (ref 3.5–5.1)
Sodium: 137 mmol/L (ref 135–145)

## 2023-01-15 MED ORDER — CIPROFLOXACIN HCL 500 MG PO TABS: 500.0000 mg | ORAL_TABLET | Freq: Every day | ORAL | Status: AC

## 2023-01-15 MED ORDER — FUROSEMIDE 40 MG PO TABS
60.0000 mg | ORAL_TABLET | Freq: Every day | ORAL | Status: DC
  Administered 2023-01-16: 60 mg via ORAL
  Filled 2023-01-15: qty 1

## 2023-01-15 MED ORDER — FLUTICASONE-SALMETEROL 250-50 MCG/ACT IN AEPB: 1.0000 | INHALATION_SPRAY | Freq: Two times a day (BID) | RESPIRATORY_TRACT | Status: AC

## 2023-01-15 MED ORDER — FUROSEMIDE 20 MG PO TABS: 60.0000 mg | ORAL_TABLET | Freq: Every day | ORAL | Status: AC

## 2023-01-15 MED ORDER — TAMSULOSIN HCL 0.4 MG PO CAPS: 0.4000 mg | ORAL_CAPSULE | Freq: Every morning | ORAL | Status: AC

## 2023-01-15 MED ORDER — MAGNESIUM OXIDE -MG SUPPLEMENT 400 (240 MG) MG PO TABS: 400.0000 mg | ORAL_TABLET | Freq: Three times a day (TID) | ORAL | Status: AC

## 2023-01-15 MED ORDER — SPIRONOLACTONE 100 MG PO TABS: 100.0000 mg | ORAL_TABLET | Freq: Every day | ORAL | Status: AC

## 2023-01-15 MED ORDER — LATANOPROST 0.005 % OP SOLN: 1.0000 [drp] | Freq: Every day | OPHTHALMIC | 12 refills | Status: AC

## 2023-01-15 NOTE — Discharge Summary (Signed)
DISCHARGE SUMMARY  Jacob Bass  MR#: [id number]  DOB:[birth year]  Date of Admission: 01/11/2023 Date of Discharge: 01/15/2023  Attending Physician:Rishon Thilges Silvestre Gunner, MD  Patient's QQV:ZDGLO, Bronwen Betters., MD  Consults: IR  Disposition: D/C back to SNF   Follow-up Appts:  Follow-up Information     Donia Ast., MD Follow up.   Specialty: Internal Medicine Contact information: [facility] [address] 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 He last underwent a paracentesis 12/24/2022. His facility was reportedly concerned that he was hypoxic, but EMS found his saturation to be 90% on room air. In the ER CXR revealed no acute disease. Exam revealed significant abdominal distention and anasarca. Of note at baseline the patient is reportedly oriented to person and place only due to his dementia.   Hospital Course:  Decompensated alcoholic cirrhosis of the liver with recurrent ascites and anasarca Managed acutely with IV  diuresis and paracentesis - IR completed a therapeutic paracentesis 5/11 removing 4 L of benign-appearing fluid - continued usual cirrhosis medications to include lactulose - transitioned back to oral diuretic 5/12 with dose increased beyond previous standard dose - net negative approximately 5500cc including ascitic fluid -as patient has refractory recurrent ascites will avoid beta-blocker use -added Aldactone - attempt to avoid hypotension in this clinical setting -clinically significantly improved status post paracentesis and diuresis   Hepatitis C No acute issues   Chronic anemia of cirrhosis and CKD Baseline hemoglobin 9-10 -suspect 7.9 at presentation was due to dilution in the setting of gross volume overload -no evidence of active bleeding at present -guaiac negative - iron levels are not low but as expected TIBC is low - hemoglobin has improved with diuresis and 1U PRBC transfused by admitting MD    CKD stage IIIb Renal function stable at present -follow intermittently at SNF with adjustments having been made in his diuretic regimen   Dementia Reportedly at baseline is only oriented to person and place -appears to be at his baseline at present   COPD Well compensated at present   HTN Blood pressure well-controlled   BPH Asymptomatic   History of CVA with residual left-sided weakness    Allergies as of 01/15/2023       Reactions   Penicillins Other (See Comments)   Unknown reaction Listed on MAR        Medication List     STOP taking these medications    Gerhardt's butt cream Crea   ipratropium-albuterol 0.5-2.5 (3) MG/3ML Soln Commonly known as: DUONEB       TAKE these medications    albuterol 108 (90 Base)  MCG/ACT inhaler Commonly known as: VENTOLIN HFA Inhale 2 puffs into the lungs every 4 (four) hours as needed for wheezing or shortness of breath.   ciprofloxacin 500 MG tablet Commonly known as: Cipro Take 1 tablet (500 mg total) by mouth daily at 8  pm.   cyanocobalamin 1000 MCG tablet Commonly known as: VITAMIN B12 Take 1,000 mcg by mouth in the morning. (0900)   Nutritional Drink Liqd Take 120 mLs by mouth 3 (three) times daily. House supplement (0900, 1400, 2100)   feeding supplement Liqd Take 237 mLs by mouth daily at 3 pm.   fluticasone-salmeterol 250-50 MCG/ACT Aepb Commonly known as: ADVAIR Inhale 1 puff into the lungs in the morning and at bedtime.   folic acid 1 MG tablet Commonly known as: FOLVITE Take 1 tablet (1 mg total) by mouth daily.   furosemide 20 MG tablet Commonly known as: LASIX Take 3 tablets (60 mg total) by mouth daily. Start taking on: Jan 16, 2023 What changed: how much to take   lactulose 10 GM/15ML solution Commonly known as: CHRONULAC Take 15 mLs (10 g total) by mouth 2 (two) times daily.   latanoprost 0.005 % ophthalmic solution Commonly known as: XALATAN Place 1 drop into both eyes at bedtime. (0900)   magnesium oxide 400 (240 Mg) MG tablet Commonly known as: MAG-OX Take 1 tablet (400 mg total) by mouth 3 (three) times daily.   omeprazole 20 MG tablet Commonly known as: PRILOSEC OTC Take 40 mg by mouth 2 (two) times daily.   senna-docusate 8.6-50 MG tablet Commonly known as: Senokot-S Take 1 tablet by mouth at bedtime as needed for mild constipation or moderate constipation.   spironolactone 100 MG tablet Commonly known as: ALDACTONE Take 1 tablet (100 mg total) by mouth daily. Start taking on: Jan 16, 2023   tamsulosin 0.4 MG Caps capsule Commonly known as: FLOMAX Take 1 capsule (0.4 mg total) by mouth in the morning. (0900)   tiotropium 18 MCG inhalation capsule Commonly known as: SPIRIVA Place 18 mcg into inhaler and inhale daily.   Zinc Oxide 10 % Oint Apply 1 Application topically every 6 (six) hours as needed (To buttocks for irritation).        Day of Discharge BP 120/85 (BP Location: Right Arm)   Pulse 75   Temp 98.1 F (36.7 C)   Resp 17   Wt 79 kg    SpO2 100%   BMI 30.85 kg/m   Physical Exam: General: No acute respiratory distress Lungs: Clear to auscultation bilaterally without wheezes or crackles Cardiovascular: Regular rate and rhythm without murmur gallop or rub normal S1 and S2 Abdomen: Distended with ascites but benign with no mass or rebound, bowel sounds positive Extremities: Trace edema bilateral lower extremities  Basic Metabolic Panel: Recent Labs  Lab 01/11/23 1747 01/12/23 0435 01/13/23 0440 01/14/23 0430 01/15/23 0555  NA 133* 135 135 134* 137  K 4.2 4.0 3.7 3.8 4.2  CL 103 105 105 107 105  CO2 23 22 24 23 23   GLUCOSE 101* 69* 82 93 84  BUN 16 14 15 15 19   CREATININE 1.07 1.01 1.07 0.98 1.17  CALCIUM 7.6* 7.7* 7.4* 7.7* 8.0*  MG  --   --  1.8 1.8  --   PHOS  --   --  3.3  --   --    CBC: Recent Labs  Lab 01/11/23 1747 01/12/23 0435 01/13/23 0440 01/14/23 0430  WBC 6.2 6.2 5.6 5.6  NEUTROABS 3.5  --   --   --  HGB 7.9* 10.3* 9.5* 9.9*  HCT 24.4* 29.8* 27.8* 29.3*  MCV 99.6 93.7 93.6 97.0  PLT 151 87* 97* 90*    Time spent in discharge (includes decision making & examination of pt): 35 minutes  01/15/2023, 4:58 PM   [name], MD [facility] Office  [phone]

## 2023-01-15 NOTE — Evaluation (Signed)
Physical Therapy Evaluation Patient Details Name: Jacob Bass MRN: [id number] DOB: April 24, 1961 Today's Date: 01/15/2023  History of Present Illness  62 year old who was transported 01/11/23 to the ER from his SNF with worsening abdominal distention and generalized edema.  CXR revealed no acute disease. Pt diagnosed with decompensated alcoholic cirrhosis of the liver with recurrent ascites and anasarca.  PMH includes CVA, CHF, COPD, cirrhosis, gastric ulcer, and dementia.   Clinical Impression  Pt presents with condition above and deficits mentioned below, see PT Problem List. Per chart, pt was residing at a SNF and needed extensive assistance for all functional mobility prior to admission. Currently, pt does not appear to be functioning far from his supposed baseline, needing maxA for bed mobility and mod-maxA with brief moments of min guard assist for static sitting balance. He demonstrates deficits in overall strength, balance, cognition, power, and activity tolerance. Will continue to follow acutely. Recommend pt return to SNF once medically cleared.     Recommendations for follow up therapy are one component of a multi-disciplinary discharge planning process, led by the attending physician.  Recommendations may be updated based on patient status, additional functional criteria and insurance authorization.  Follow Up Recommendations Can patient physically be transported by private vehicle: No     Assistance Recommended at Discharge Frequent or constant Supervision/Assistance  Patient can return home with the following  Two people to help with walking and/or transfers;A lot of help with bathing/dressing/bathroom;Assistance with cooking/housework;Direct supervision/assist for medications management;Assistance with feeding;Direct supervision/assist for financial management;Assist for transportation;Help with stairs or ramp for entrance    Equipment Recommendations Other (comment) (defer to next  venue of care)  Recommendations for Other Services       Functional Status Assessment Patient has had a recent decline in their functional status and/or demonstrates limited ability to make significant improvements in function in a reasonable and predictable amount of time     Precautions / Restrictions Precautions Precautions: Fall Precaution Comments: left hemiparesis with increased tone in the LUE Restrictions Weight Bearing Restrictions: No      Mobility  Bed Mobility Overal bed mobility: Needs Assistance Bed Mobility: Supine to Sit, Sit to Supine     Supine to sit: Max assist, HOB elevated Sit to supine: Max assist, HOB elevated   General bed mobility comments: Cues provided to bring each leg off EOB, needing assistance to complete and ascend trunk with pt reaching for bed rail with R UE. Assistance needed to control trunk with descent to supine and lifting of legs onto bed.    Transfers                   General transfer comment: deferred    Ambulation/Gait               General Gait Details: deferred  Stairs            Wheelchair Mobility    Modified Rankin (Stroke Patients Only)       Balance Overall balance assessment: Needs assistance Sitting-balance support: Feet supported, Single extremity supported, No upper extremity supported Sitting balance-Leahy Scale: Poor Sitting balance - Comments: Pt with posterior and R lateral lean, often reaching for bed rail with R UE. Multi-modal cues provided to reach anteriorly towards knees or feet to reduce posterior lean, momentary success noted. Mod-maxA majority of time for sitting balance with short bouts of min  guard-minA. Postural control: Right lateral lean, Posterior lean     Standing balance comment: deferred  Pertinent Vitals/Pain Pain Assessment Pain Assessment: Faces Faces Pain Scale: Hurts little more Pain Location: appeared to be at the  abdomen Pain Descriptors / Indicators: Discomfort, Grimacing, Guarding Pain Intervention(s): Monitored during session    Home Living Family/patient expects to be discharged to:: Skilled nursing facility                   Additional Comments: From [employer] per chart    Prior Function Prior Level of Function : Patient poor historian/Family not available             Mobility Comments: Per older notes, pt was total assist for mobility via wheelchair ADLs Comments: Per previos notes, he was total A for all basic ADLs with min to max assist for self feeding.     Hand Dominance   Dominant Hand: Right    Extremity/Trunk Assessment   Upper Extremity Assessment Upper Extremity Assessment: Defer to OT evaluation    Lower Extremity Assessment Lower Extremity Assessment: Generalized weakness    Cervical / Trunk Assessment Cervical / Trunk Assessment: Kyphotic  Communication   Communication: Expressive difficulties;Other (comment) (dysarthria)  Cognition Arousal/Alertness: Awake/alert Behavior During Therapy: WFL for tasks assessed/performed Overall Cognitive Status: History of cognitive impairments - at baseline                                 General Comments: Pt likely at baseline for cognition. Needed encouragement to participate but pleasant and smiling intermittently. Slow to process cues with poor problem-solving to improve his sitting balance        General Comments      Exercises     Assessment/Plan    PT Assessment Patient needs continued PT services  PT Problem List Decreased strength;Decreased activity tolerance;Decreased balance;Decreased coordination;Decreased mobility;Decreased cognition;Pain       PT Treatment Interventions DME instruction;Functional mobility training;Therapeutic activities;Therapeutic exercise;Balance training;Neuromuscular re-education;Cognitive remediation;Patient/family education;Wheelchair mobility training     PT Goals (Current goals can be found in the Care Plan section)  Acute Rehab PT Goals Patient Stated Goal: did not state PT Goal Formulation: With patient Time For Goal Achievement: 01/29/23 Potential to Achieve Goals: Fair    Frequency Min 2X/week     Co-evaluation               AM-PAC PT "6 Clicks" Mobility  Outcome Measure Help needed turning from your back to your side while in a flat bed without using bedrails?: A Lot Help needed moving from lying on your back to sitting on the side of a flat bed without using bedrails?: A Lot Help needed moving to and from a bed to a chair (including a wheelchair)?: Total Help needed standing up from a chair using your arms (e.g., wheelchair or bedside chair)?: Total Help needed to walk in hospital room?: Total Help needed climbing 3-5 steps with a railing? : Total 6 Click Score: 8    End of Session   Activity Tolerance: Patient tolerated treatment well Patient left: in bed;with call bell/phone within reach;with bed alarm set   PT Visit Diagnosis: Muscle weakness (generalized) (M62.81);Pain Pain - part of body:  (abdomen)    Time: 1308-6578 PT Time Calculation (min) (ACUTE ONLY): 14 min   Charges:   PT Evaluation $PT Eval Moderate Complexity: 1 Mod          Raymond Gurney, PT, DPT Acute Rehabilitation Services  Office: [phone]   Jewel Baize 01/15/2023,  3:18 PM

## 2023-01-15 NOTE — Progress Notes (Signed)
Jacob Bass  ZOX:[id number] DOB: 01-01-1961 DOA: 01/11/2023 PCP: Donia Ast., MD    Brief Narrative:  62 year old with a history of alcoholic cirrhosis of the liver, hepatitis C, prior SBP, portal gastropathy, chronic thrombocytopenia, pleural effusions, CVA with residual left-sided weakness, chronic anemia, CKD stage IIIb, COPD, HTN, BPH, and dementia who was transported to the ER from his SNF with worsening abdominal distention and generalized edema.  He last underwent a paracentesis 12/24/2022.  His facility was reportedly concerned that he was hypoxic, but EMS found his saturation to be 90% on room air.  In the ER CXR revealed no acute disease.  Exam revealed significant abdominal distention and anasarca.  Of note at baseline the patient is reportedly oriented to person and place only due to his dementia.  Consultants:  IR  Goals of Care:  Code Status: Full Code   DVT prophylaxis: SCDs  Interim Hx: No acute events reported overnight.  Afebrile.  Vital signs stable.  Net negative approximately 5500 cc since admission.  Resting comfortably in bed at the time my visit.  Denies any complaints.  Reports a good appetite.  Appears comfortable.  Assessment & Plan:  Decompensated alcoholic cirrhosis of the liver with recurrent ascites and anasarca Managed acutely with IV diuresis and paracentesis - IR completed a therapeutic paracentesis 5/11 removing 4 L of benign-appearing fluid - continue usual cirrhosis medications to include lactulose - transitioned back to oral diuretic 5/12 with dose increased beyond previous standard dose - net negative approximately 5500 cc thus far including ascitic fluid -as patient has refractory recurrent ascites will avoid beta-blocker use -added Aldactone - attempt to avoid hypotension in this clinical setting -clinically significantly improved status post paracentesis and diuresis  Hepatitis C No acute issues  Chronic anemia of cirrhosis and CKD Baseline  hemoglobin 9-10 -suspect 7.9 at presentation was due to dilution in the setting of gross volume overload -no evidence of active bleeding at present -guaiac negative - iron levels are not low but as expected TIBC is low - hemoglobin has improved with diuresis and 1U PRBC transfused by admitting MD   CKD stage IIIb Renal function stable at present -recheck in a.m. w/ ongoing diuresis   Dementia Reportedly at baseline is only oriented to person and place -appears to be at his baseline at present  COPD Well compensated at present  HTN Blood pressure well-controlled  BPH Asymptomatic  History of CVA with residual left-sided weakness  Family Communication: No family present at time of exam Disposition: Now medically stable to return to SNF  Objective: Blood pressure 119/71, pulse 80, temperature 98.1 F (36.7 C), temperature source Oral, resp. rate 17, weight 79 kg, SpO2 100 %.  Intake/Output Summary (Last 24 hours) at 01/15/2023 0821 Last data filed at 01/15/2023 0600 Gross per 24 hour  Intake --  Output 450 ml  Net -450 ml    Filed Weights   01/12/23 0509  Weight: 79 kg    Examination: General: No acute respiratory distress Lungs: Clear to auscultation bilaterally -no wheezing Cardiovascular: Regular rate and rhythm without murmur Abdomen: Remains protuberant with ascites but not massively so, soft, no rebound, no appreciable mass Extremities: 1+ diffuse bilateral lower extremity edema without cyanosis or clubbing  CBC: Recent Labs  Lab 01/11/23 1747 01/12/23 0435 01/13/23 0440 01/14/23 0430  WBC 6.2 6.2 5.6 5.6  NEUTROABS 3.5  --   --   --   HGB 7.9* 10.3* 9.5* 9.9*  HCT 24.4* 29.8* 27.8* 29.3*  MCV  99.6 93.7 93.6 97.0  PLT 151 87* 97* 90*    Basic Metabolic Panel: Recent Labs  Lab 01/13/23 0440 01/14/23 0430 01/15/23 0555  NA 135 134* 137  K 3.7 3.8 4.2  CL 105 107 105  CO2 24 23 23   GLUCOSE 82 93 84  BUN 15 15 19   CREATININE 1.07 0.98 1.17   CALCIUM 7.4* 7.7* 8.0*  MG 1.8 1.8  --   PHOS 3.3  --   --     GFR: Estimated Creatinine Clearance: 59.3 mL/min (by C-G formula based on SCr of 1.17 mg/dL).   Scheduled Meds:  ciprofloxacin  500 mg Oral Q2000   cyanocobalamin  1,000 mcg Oral q AM   feeding supplement  237 mL Oral Q1500   folic acid  1 mg Oral Daily   furosemide  80 mg Oral Daily   lactulose  10 g Oral BID   latanoprost  1 drop Both Eyes QHS   magnesium oxide  400 mg Oral TID   melatonin  5 mg Oral QHS   mometasone-formoterol  2 puff Inhalation BID   pantoprazole  20 mg Oral Daily   spironolactone  100 mg Oral Daily   tamsulosin  0.4 mg Oral q AM   umeclidinium bromide  1 puff Inhalation Daily     LOS: 4 days   [name], MD [facility] Office  [phone] Pager - Text Page per Amion  If 7PM-7AM, please contact night-coverage per Amion 01/15/2023, 8:21 AM

## 2023-01-16 DIAGNOSIS — R188 Other ascites: Secondary | ICD-10-CM

## 2023-01-16 LAB — BASIC METABOLIC PANEL
Anion gap: 9 (ref 5–15)
BUN: 24 mg/dL — ABNORMAL HIGH (ref 8–23)
CO2: 25 mmol/L (ref 22–32)
Calcium: 8.3 mg/dL — ABNORMAL LOW (ref 8.9–10.3)
Chloride: 106 mmol/L (ref 98–111)
Creatinine, Ser: 1.13 mg/dL (ref 0.61–1.24)
GFR, Estimated: >60 mL/min (ref >60)
Glucose, Bld: 96 mg/dL (ref 70–99)
Potassium: 5.2 mmol/L — ABNORMAL HIGH (ref 3.5–5.1)
Sodium: 140 mmol/L (ref 135–145)

## 2023-01-16 LAB — MAGNESIUM: Magnesium: 1.8 mg/dL (ref 1.7–2.4)

## 2023-01-16 NOTE — TOC Transition Note (Signed)
Transition of Care [facility]) - CM/SW Discharge Note   Patient Details  Name: Jacob Bass MRN: [id number] Date of Birth: 1961-05-22  [facility]) CM/SW Contact:  Lakoda Mcanany A [place], LCSWA Phone Number: 01/16/2023, 10:44 AM   Clinical Narrative:     Patient will DC to: [facility]  Anticipated DC date: 01/16/23  Family notified: [name], guardian  Transport by: Sharin Mons      Per MD patient ready for DC to  [facility]. RN, patient, patient's family, and facility notified of DC. Discharge Summary and sent to facility. RN to call report prior to discharge ( #[phone], room 114). DC packet on chart. Ambulance transport requested for patient.     CSW will sign off for now as social work intervention is no longer needed. Please consult [place] again if new needs arise.   Final next level of care: Long Term Nursing Home Barriers to Discharge: No Barriers Identified   Patient Goals and CMS Choice      Discharge Placement                  Patient to be transferred to facility by: [facility] Name of family member notified: [name], interim guaridan at [facility] Patient and family notified of of transfer: 01/16/23  Discharge Plan and Services Additional resources added to the After Visit Summary for                                       Social Determinants of Health (SDOH) Interventions SDOH Screenings   Food Insecurity: No Food Insecurity (01/11/2023)  Transportation Needs: Unmet Transportation Needs (01/11/2023)  Utilities: Patient Declined (10/29/2022)  Tobacco Use: Medium Risk (01/11/2023)     Readmission Risk Interventions    10/30/2022    9:16 AM  Readmission Risk Prevention Plan  Transportation Screening Complete  Medication Review (RN Care Manager) Complete  PCP or Specialist appointment within 3-5 days of discharge Complete  HRI or Home Care Consult Complete  SW Recovery Care/Counseling Consult Complete  Palliative Care  Screening Not Applicable  Skilled Nursing Facility Complete

## 2023-01-16 NOTE — Discharge Summary (Signed)
Physician Discharge Summary  Jacob Bass ZOX:[id number] DOB: 04/08/1961 DOA: 01/11/2023  PCP: Donia Ast., MD  Admit date: 01/11/2023 Discharge date: 01/16/2023  Doing well Vitals stable.  Ok for [employer].  Dc Summary completed by Dr Sharon Seller on 01/15/23. No further changes.   SIGNED:   Dimple Nanas, MD  [facility] 01/16/2023, 10:42 AM   If 7PM-7AM, please contact night-coverage

## 2023-01-16 NOTE — TOC Progression Note (Signed)
Transition of Care [facility]) - Progression Note    Patient Details  Name: Jacob Bass MRN: [id number] Date of Birth: April 21, 1961  [facility] of [facility]) CM/SW Contact  Caulder Wehner A [place], [place] Phone Number: 01/16/2023, 10:30 AM  Clinical Narrative:     CSW made attempt to contact pt's interim guardian, [name], [phone] to inform pt's admission and discharge back to [facility]. CSW left voicemail with contact information to reach CSW.   CSW contacted the [facility] after hours line regarding wards of the state. CSW left information to relay to social service department regarding discharge. CSW spoke with attendant who contacted CSW stating CSW would need to speak with guardianship social worker directly.   CSW then received phone call directly from [name], guardianship social worker. She stated that she was informed of pt's admission and CSW informed her of pt's discharge. CSW received verbal permission from guardian that discharge was acceptable back to [facility].   CSW will continue to assist pt with discharge back to long term care facility.          Expected Discharge Plan and Services         Expected Discharge Date: 01/16/23                                     Social Determinants of Health (SDOH) Interventions SDOH Screenings   Food Insecurity: No Food Insecurity (01/11/2023)  Transportation Needs: Unmet Transportation Needs (01/11/2023)  Utilities: Patient Declined (10/29/2022)  Tobacco Use: Medium Risk (01/11/2023)    Readmission Risk Interventions    10/30/2022    9:16 AM  Readmission Risk Prevention Plan  Transportation Screening Complete  Medication Review (RN Care Manager) Complete  PCP or Specialist appointment within 3-5 days of discharge Complete  HRI or Home Care Consult Complete  SW Recovery Care/Counseling Consult Complete  Palliative Care Screening Not Applicable  Skilled Nursing Facility Complete

## 2023-01-23 ENCOUNTER — Emergency Department (HOSPITAL_COMMUNITY): Payer: Medicaid Other

## 2023-01-23 ENCOUNTER — Encounter (HOSPITAL_COMMUNITY): Payer: Self-pay | Admitting: Internal Medicine

## 2023-01-23 ENCOUNTER — Inpatient Hospital Stay (HOSPITAL_COMMUNITY)
Admission: EM | Admit: 2023-01-23 | Discharge: 2023-01-29 | DRG: 442 | Disposition: A | Discharge status: Skilled Nursing Facility | Payer: Medicaid Other | Source: Skilled Nursing Facility | Attending: Internal Medicine | Admitting: Internal Medicine

## 2023-01-23 ENCOUNTER — Observation Stay (HOSPITAL_COMMUNITY): Payer: Medicaid Other

## 2023-01-23 ENCOUNTER — Other Ambulatory Visit: Payer: Self-pay

## 2023-01-23 DIAGNOSIS — R4182 Altered mental status, unspecified: Secondary | ICD-10-CM | POA: Diagnosis present

## 2023-01-23 DIAGNOSIS — D6959 Other secondary thrombocytopenia: Secondary | ICD-10-CM | POA: Diagnosis present

## 2023-01-23 DIAGNOSIS — K7460 Unspecified cirrhosis of liver: Secondary | ICD-10-CM

## 2023-01-23 DIAGNOSIS — Z653 Problems related to other legal circumstances: Secondary | ICD-10-CM

## 2023-01-23 DIAGNOSIS — R29810 Facial weakness: Principal | ICD-10-CM | POA: Diagnosis present

## 2023-01-23 DIAGNOSIS — K7682 Hepatic encephalopathy: Principal | ICD-10-CM | POA: Diagnosis present

## 2023-01-23 DIAGNOSIS — I639 Cerebral infarction, unspecified: Secondary | ICD-10-CM | POA: Diagnosis not present

## 2023-01-23 DIAGNOSIS — K7031 Alcoholic cirrhosis of liver with ascites: Secondary | ICD-10-CM | POA: Diagnosis present

## 2023-01-23 DIAGNOSIS — Z880 Allergy status to penicillin: Secondary | ICD-10-CM

## 2023-01-23 DIAGNOSIS — N1832 Chronic kidney disease, stage 3b: Secondary | ICD-10-CM

## 2023-01-23 DIAGNOSIS — F0390 Unspecified dementia without behavioral disturbance: Secondary | ICD-10-CM | POA: Diagnosis present

## 2023-01-23 DIAGNOSIS — I10 Essential (primary) hypertension: Secondary | ICD-10-CM | POA: Diagnosis not present

## 2023-01-23 DIAGNOSIS — Z66 Do not resuscitate: Secondary | ICD-10-CM | POA: Diagnosis not present

## 2023-01-23 DIAGNOSIS — K922 Gastrointestinal hemorrhage, unspecified: Secondary | ICD-10-CM | POA: Diagnosis present

## 2023-01-23 DIAGNOSIS — R4701 Aphasia: Secondary | ICD-10-CM | POA: Diagnosis present

## 2023-01-23 DIAGNOSIS — E785 Hyperlipidemia, unspecified: Secondary | ICD-10-CM | POA: Diagnosis present

## 2023-01-23 DIAGNOSIS — R0989 Other specified symptoms and signs involving the circulatory and respiratory systems: Secondary | ICD-10-CM

## 2023-01-23 DIAGNOSIS — Z515 Encounter for palliative care: Secondary | ICD-10-CM

## 2023-01-23 DIAGNOSIS — I69354 Hemiplegia and hemiparesis following cerebral infarction affecting left non-dominant side: Secondary | ICD-10-CM

## 2023-01-23 DIAGNOSIS — R1310 Dysphagia, unspecified: Secondary | ICD-10-CM | POA: Diagnosis present

## 2023-01-23 DIAGNOSIS — Z7951 Long term (current) use of inhaled steroids: Secondary | ICD-10-CM

## 2023-01-23 DIAGNOSIS — E876 Hypokalemia: Secondary | ICD-10-CM | POA: Diagnosis not present

## 2023-01-23 DIAGNOSIS — I69322 Dysarthria following cerebral infarction: Secondary | ICD-10-CM

## 2023-01-23 DIAGNOSIS — E162 Hypoglycemia, unspecified: Secondary | ICD-10-CM | POA: Diagnosis not present

## 2023-01-23 DIAGNOSIS — K279 Peptic ulcer, site unspecified, unspecified as acute or chronic, without hemorrhage or perforation: Secondary | ICD-10-CM

## 2023-01-23 DIAGNOSIS — Z8673 Personal history of transient ischemic attack (TIA), and cerebral infarction without residual deficits: Secondary | ICD-10-CM | POA: Diagnosis not present

## 2023-01-23 DIAGNOSIS — Z87891 Personal history of nicotine dependence: Secondary | ICD-10-CM

## 2023-01-23 DIAGNOSIS — I130 Hypertensive heart and chronic kidney disease with heart failure and stage 1 through stage 4 chronic kidney disease, or unspecified chronic kidney disease: Secondary | ICD-10-CM | POA: Diagnosis present

## 2023-01-23 DIAGNOSIS — I5032 Chronic diastolic (congestive) heart failure: Secondary | ICD-10-CM | POA: Diagnosis present

## 2023-01-23 DIAGNOSIS — Z8619 Personal history of other infectious and parasitic diseases: Secondary | ICD-10-CM

## 2023-01-23 DIAGNOSIS — D649 Anemia, unspecified: Secondary | ICD-10-CM | POA: Diagnosis present

## 2023-01-23 DIAGNOSIS — J449 Chronic obstructive pulmonary disease, unspecified: Secondary | ICD-10-CM | POA: Diagnosis present

## 2023-01-23 DIAGNOSIS — I6789 Other cerebrovascular disease: Secondary | ICD-10-CM | POA: Diagnosis present

## 2023-01-23 DIAGNOSIS — Z8711 Personal history of peptic ulcer disease: Secondary | ICD-10-CM

## 2023-01-23 DIAGNOSIS — Z79899 Other long term (current) drug therapy: Secondary | ICD-10-CM

## 2023-01-23 DIAGNOSIS — D631 Anemia in chronic kidney disease: Secondary | ICD-10-CM | POA: Diagnosis present

## 2023-01-23 LAB — AMMONIA: Ammonia: 137 umol/L — ABNORMAL HIGH (ref 9–35)

## 2023-01-23 LAB — I-STAT CHEM 8, ED
BUN: 22 mg/dL (ref 8–23)
Calcium, Ion: 1.16 mmol/L (ref 1.15–1.40)
Chloride: 102 mmol/L (ref 98–111)
Creatinine, Ser: 1 mg/dL (ref 0.61–1.24)
Glucose, Bld: 86 mg/dL (ref 70–99)
HCT: 34 % — ABNORMAL LOW (ref 39.0–52.0)
Hemoglobin: 11.6 g/dL — ABNORMAL LOW (ref 13.0–17.0)
Potassium: 4.2 mmol/L (ref 3.5–5.1)
Sodium: 141 mmol/L (ref 135–145)
TCO2: 31 mmol/L (ref 22–32)

## 2023-01-23 LAB — COMPREHENSIVE METABOLIC PANEL
ALT: 38 U/L (ref 0–44)
AST: 66 U/L — ABNORMAL HIGH (ref 15–41)
Albumin: 1.7 g/dL — ABNORMAL LOW (ref 3.5–5.0)
Alkaline Phosphatase: 75 U/L (ref 38–126)
Anion gap: 8 (ref 5–15)
BUN: 20 mg/dL (ref 8–23)
CO2: 25 mmol/L (ref 22–32)
Calcium: 8.2 mg/dL — ABNORMAL LOW (ref 8.9–10.3)
Chloride: 103 mmol/L (ref 98–111)
Creatinine, Ser: 1.19 mg/dL (ref 0.61–1.24)
GFR, Estimated: >60 mL/min (ref >60)
Glucose, Bld: 90 mg/dL (ref 70–99)
Potassium: 4.1 mmol/L (ref 3.5–5.1)
Sodium: 136 mmol/L (ref 135–145)
Total Bilirubin: 2 mg/dL — ABNORMAL HIGH (ref 0.3–1.2)
Total Protein: 7 g/dL (ref 6.5–8.1)

## 2023-01-23 LAB — CBC
HCT: 32.6 % — ABNORMAL LOW (ref 39.0–52.0)
Hemoglobin: 11.2 g/dL — ABNORMAL LOW (ref 13.0–17.0)
MCH: 32.5 pg (ref 26.0–34.0)
MCHC: 34.4 g/dL (ref 30.0–36.0)
MCV: 94.5 fL (ref 80.0–100.0)
Platelets: 63 10*3/uL — ABNORMAL LOW (ref 150–400)
RBC: 3.45 MIL/uL — ABNORMAL LOW (ref 4.22–5.81)
RDW: 17 % — ABNORMAL HIGH (ref 11.5–15.5)
WBC: 5.3 10*3/uL (ref 4.0–10.5)
nRBC: 0 % (ref 0.0–0.2)

## 2023-01-23 LAB — PROTIME-INR
INR: 1.4 — ABNORMAL HIGH (ref 0.8–1.2)
Prothrombin Time: 17.7 s — ABNORMAL HIGH (ref 11.4–15.2)

## 2023-01-23 LAB — MAGNESIUM: Magnesium: 1.6 mg/dL — ABNORMAL LOW (ref 1.7–2.4)

## 2023-01-23 LAB — DIFFERENTIAL
Abs Immature Granulocytes: 0.04 10*3/uL (ref 0.00–0.07)
Basophils Absolute: 0 10*3/uL (ref 0.0–0.1)
Basophils Relative: 1 %
Eosinophils Absolute: 0.2 10*3/uL (ref 0.0–0.5)
Eosinophils Relative: 4 %
Immature Granulocytes: 1 %
Lymphocytes Relative: 23 %
Lymphs Abs: 1.2 10*3/uL (ref 0.7–4.0)
Monocytes Absolute: 1 10*3/uL (ref 0.1–1.0)
Monocytes Relative: 18 %
Neutro Abs: 2.9 10*3/uL (ref 1.7–7.7)
Neutrophils Relative %: 53 %

## 2023-01-23 LAB — CBG MONITORING, ED: Glucose-Capillary: 91 mg/dL (ref 70–99)

## 2023-01-23 LAB — ETHANOL: Alcohol, Ethyl (B): <10 mg/dL

## 2023-01-23 LAB — APTT: aPTT: 34 seconds (ref 24–36)

## 2023-01-23 MED ORDER — LORAZEPAM 2 MG/ML IJ SOLN: 1.0000 mg | Freq: Once | INTRAMUSCULAR | Status: DC | PRN

## 2023-01-23 MED ORDER — ACETAMINOPHEN 325 MG PO TABS: 650.0000 mg | ORAL_TABLET | ORAL | Status: DC | PRN

## 2023-01-23 MED ORDER — LACTULOSE ENEMA
300.0000 mL | Freq: Once | ORAL | Status: AC
  Administered 2023-01-23: 300 mL via RECTAL
  Filled 2023-01-23: qty 300

## 2023-01-23 MED ORDER — ACETAMINOPHEN 650 MG RE SUPP: 650.0000 mg | RECTAL | Status: DC | PRN

## 2023-01-23 MED ORDER — ALBUTEROL SULFATE (2.5 MG/3ML) 0.083% IN NEBU
2.5000 mg | INHALATION_SOLUTION | RESPIRATORY_TRACT | Status: DC | PRN
  Administered 2023-01-28: 2.5 mg via RESPIRATORY_TRACT
  Filled 2023-01-23: qty 3

## 2023-01-23 MED ORDER — LORAZEPAM 2 MG/ML IJ SOLN: 1.0000 mg | Freq: Once | INTRAMUSCULAR | Status: DC

## 2023-01-23 MED ORDER — STROKE: EARLY STAGES OF RECOVERY BOOK
Freq: Once | Status: AC
  Filled 2023-01-23: qty 1

## 2023-01-23 MED ORDER — SODIUM CHLORIDE 0.9 % IV SOLN: INTRAVENOUS | Status: DC | PRN

## 2023-01-23 MED ORDER — ALBUTEROL SULFATE HFA 108 (90 BASE) MCG/ACT IN AERS: 2.0000 | INHALATION_SPRAY | RESPIRATORY_TRACT | Status: DC | PRN

## 2023-01-23 MED ORDER — LACTULOSE ENEMA
300.0000 mL | Freq: Two times a day (BID) | ORAL | Status: DC
  Administered 2023-01-24 – 2023-01-25 (×2): 300 mL via RECTAL
  Filled 2023-01-23 (×4): qty 300

## 2023-01-23 MED ORDER — MOMETASONE FURO-FORMOTEROL FUM 200-5 MCG/ACT IN AERO
2.0000 | INHALATION_SPRAY | Freq: Two times a day (BID) | RESPIRATORY_TRACT | Status: DC
  Administered 2023-01-24 – 2023-01-29 (×8): 2 via RESPIRATORY_TRACT
  Filled 2023-01-23 (×2): qty 8.8

## 2023-01-23 MED ORDER — LORAZEPAM 2 MG/ML IJ SOLN
1.0000 mg | Freq: Once | INTRAMUSCULAR | Status: DC | PRN
  Filled 2023-01-23: qty 1

## 2023-01-23 MED ORDER — ACETAMINOPHEN 160 MG/5ML PO SOLN: 650.0000 mg | ORAL | Status: DC | PRN

## 2023-01-23 NOTE — Evaluation (Signed)
Speech Language Pathology Evaluation Patient Details Name: Jacob Bass MRN: [id number] DOB: 11-Feb-1961 Today's Date: 01/23/2023 Time: 4098-1191 SLP Time Calculation (min) (ACUTE ONLY): 15 min  Problem List:  Patient Active Problem List   Diagnosis Date Noted   Acute CVA (cerebrovascular accident) (HCC) 01/23/2023   Ascites 01/11/2023   Gastrointestinal hemorrhage 10/29/2022   Bright red blood per rectum 10/28/2022   Stage 3b chronic kidney disease (CKD) (HCC) 10/28/2022   Heme positive stool 09/27/2022   Malnutrition of moderate degree 09/14/2022   History of CVA (cerebrovascular accident) 09/10/2022   COPD (chronic obstructive pulmonary disease) (HCC) 09/10/2022   Anemia 09/10/2022   PUD (peptic ulcer disease) 09/10/2022   Hypernatremia 07/06/2022   Encephalopathy 07/01/2022   Altered mental status 06/30/2022   Bilateral lower extremity edema 06/30/2022   Poor fluid intake 06/30/2022   Hypoglycemia    Metabolic encephalopathy    Goals of care, counseling/discussion    Failure to thrive in adult 04/12/2022   Hypokalemia    Generalized weakness    Hypomagnesemia    COPD (chronic obstructive pulmonary disease) (HCC)    Dysphagia    Fall    Gastric ulcer    Cerebral thrombosis with cerebral infarction 10/13/2018   Cerebral embolism with cerebral infarction 10/13/2018   Subarachnoid hemorrhage 10/13/2018   Intracerebral hemorrhage 10/13/2018   Heme positive stool    Cirrhosis of liver without ascites (HCC)    Chest pain 10/09/2018   Elevated troponin 10/09/2018   EKG abnormalities 10/09/2018   Homelessness 10/09/2018   Hypertension 10/09/2018   Anemia, macrocytic 10/09/2018   Thrombocytopenia (HCC) 10/09/2018   Past Medical History:  Past Medical History:  Diagnosis Date   CHF (congestive heart failure) (HCC)    Cirrhosis (HCC)    COPD (chronic obstructive pulmonary disease) (HCC)    ETOH abuse    Gastric ulcer    Hypertension    Stroke (cerebrum) (HCC)  09/11/2022   Stroke [facility])    Past Surgical History:  Past Surgical History:  Procedure Laterality Date   BIOPSY  10/19/2018   Procedure: BIOPSY;  Surgeon: Benancio Deeds, MD;  Location: [facility] ENDOSCOPY;  Service: Gastroenterology;;   ESOPHAGOGASTRODUODENOSCOPY (EGD) WITH PROPOFOL N/A 10/19/2018   Procedure: ESOPHAGOGASTRODUODENOSCOPY (EGD);  Surgeon: Benancio Deeds, MD;  Location: [facility] ENDOSCOPY;  Service: Gastroenterology;  Laterality: N/A;   IR PARACENTESIS  09/19/2022   IR PARACENTESIS  09/25/2022   IR PARACENTESIS  10/03/2022   IR PARACENTESIS  10/11/2022   IR PARACENTESIS  10/24/2022   IR PARACENTESIS  10/30/2022   IR PARACENTESIS  12/24/2022   HPI:  Patient is a 62 y.o. male with PMH: dementia, CVA with left-sided deficits, dysphagia and aphasia (Janurary 2024), CHF, cirrhosis, COPD, ETOH abuse, HTN. Following prolonged hospitalization following in January of this year, patient had discharged to SNF. He presented to the ED from SNF on 01/23/2023 as a code stroke when SNF staff noted his pills from morning were still in his mouth. In ED, Chest x-ray with mild atelectasis versus infiltrate of the left lower lobe,  CT head showed no acute abnormality and did show chronic infarct. MRI brain pending.   Assessment / Plan / Recommendation Clinical Impression  Patient is presenting at or near baseline cognitive-linguistic and speech function as compared to how he was at time of discharge to SNF (admitted 1/8-2/23/24) following CVA. He continues to be completely non verbal and no initiation or attempts to vocalize. He is not able to exhibit any meaningful non-verbal communication or  follow verbal, visual/modeled commands. Today, he is in bed, constantly pulling himself with handrail to left side of bed. Initially, he appeared to be trying to lay on side for comfort, but he perseverated on his movements and never reached a moment of stillness. He allowed for SLP to perform some oral care with suction  but then started to refuse. Of note, compared to how patient was presenting during previous admission following CVA, he is now more contracted with left arm tightly held across chest and he appears somewhat gaunt, likely has lost weight. Patient appears to be at or near his baseline and in addition, he does not appear rehabilitatable.    SLP Assessment  SLP Recommendation/Assessment: Patient does not need any further Speech Lanaguage Pathology Services SLP Visit Diagnosis: Cognitive communication deficit (R41.841)    Recommendations for follow up therapy are one component of a multi-disciplinary discharge planning process, led by the attending physician.  Recommendations may be updated based on patient status, additional functional criteria and insurance authorization.    Follow Up Recommendations  Other (comment) (SNF without SLP f/u)    Assistance Recommended at Discharge  Frequent or constant Supervision/Assistance  Functional Status Assessment Patient has not had a recent decline in their functional status  Frequency and Duration     N/A      SLP Evaluation Cognition  Overall Cognitive Status: History of cognitive impairments - at baseline Arousal/Alertness: Awake/alert Orientation Level: Other (comment) (unable to demonstrate any level of orientation) Attention: Focused Focused Attention: Impaired Focused Attention Impairment: Functional basic Behaviors: Restless;Perseveration       Comprehension  Auditory Comprehension Overall Auditory Comprehension: Impaired at baseline    Expression Expression Primary Mode of Expression: Other (comment) (non verbal but with minimal to no ability to communicate nonverbally) Verbal Expression Overall Verbal Expression: Impaired at baseline   Oral / Motor  Oral Motor/Sensory Function Overall Oral Motor/Sensory Function: Severe impairment Facial ROM: Reduced left Facial Symmetry: Abnormal symmetry left Facial Strength: Reduced  left Lingual ROM: Reduced right;Reduced left Motor Speech Overall Motor Speech: Other (comment) (non verbal)            Angela Nevin, MA, CCC-SLP Speech Therapy

## 2023-01-23 NOTE — Consult Note (Signed)
Neurology Consultation  Reason for Consult: Code Stroke Referring Physician: Criss Alvine  CC: Left facial droop  History is obtained from: EMS  HPI: Jacob Bass is a 62 y.o. male with a past medical history of alcoholic liver cirrhosis, HCV, history of SBP, portal gastropathy, thrombocytopenia, pleural effusion, recurrent ascites, stroke with residual left-sided weakness, dementia, anemia, diverticular GI bleed, CKD stage 2-3a, COPD, hypertension presenting with left facial droop.  He was seen in 2020 and 2024 for acute infarcts as well.  He was previously on aspirin 81 mg however after his last stroke he was not put on DAPT due to thrombocytopenia and history of GI bleed.  At that time he was discharged to skilled nursing facility.   LKW: 0930 TNK given?: No, change in NIH too low Premorbid modified Rankin scale (mRS): 5  ROS: Full ROS was performed and is negative except as noted in the HPI.   Past Medical History:  Diagnosis Date   CHF (congestive heart failure) (HCC)    Cirrhosis (HCC)    COPD (chronic obstructive pulmonary disease) (HCC)    ETOH abuse    Gastric ulcer    Hypertension    Stroke (HCC)     No family history on file.  Social History:   reports that he quit smoking about 4 months ago. His smoking use included cigarettes. He smoked an average of 2 packs per day. He has never used smokeless tobacco. He reports current alcohol use. He reports that he does not use drugs.  Medications No current facility-administered medications for this encounter.  Current Outpatient Medications:    albuterol (VENTOLIN HFA) 108 (90 Base) MCG/ACT inhaler, Inhale 2 puffs into the lungs every 4 (four) hours as needed for wheezing or shortness of breath., Disp: , Rfl:    ciprofloxacin (CIPRO) 500 MG tablet, Take 1 tablet (500 mg total) by mouth daily at 8 pm., Disp: , Rfl:    cyanocobalamin (VITAMIN B12) 1000 MCG tablet, Take 1,000 mcg by mouth in the morning. (0900), Disp: , Rfl:     feeding supplement, ENSURE ENLIVE, (ENSURE ENLIVE) LIQD, Take 237 mLs by mouth daily at 3 pm., Disp: 237 mL, Rfl: 12   fluticasone-salmeterol (ADVAIR) 250-50 MCG/ACT AEPB, Inhale 1 puff into the lungs in the morning and at bedtime., Disp: , Rfl:    folic acid (FOLVITE) 1 MG tablet, Take 1 tablet (1 mg total) by mouth daily., Disp: , Rfl:    furosemide (LASIX) 20 MG tablet, Take 3 tablets (60 mg total) by mouth daily., Disp: 30 tablet, Rfl:    lactulose (CHRONULAC) 10 GM/15ML solution, Take 15 mLs (10 g total) by mouth 2 (two) times daily., Disp: 236 mL, Rfl: 0   latanoprost (XALATAN) 0.005 % ophthalmic solution, Place 1 drop into both eyes at bedtime. (0900), Disp: 2.5 mL, Rfl: 12   magnesium oxide (MAG-OX) 400 (240 Mg) MG tablet, Take 1 tablet (400 mg total) by mouth 3 (three) times daily., Disp: , Rfl:    Nutritional Supplements (NUTRITIONAL DRINK) LIQD, Take 120 mLs by mouth 3 (three) times daily. House supplement (0900, 1400, 2100), Disp: , Rfl:    omeprazole (PRILOSEC OTC) 20 MG tablet, Take 40 mg by mouth 2 (two) times daily., Disp: , Rfl:    senna-docusate (SENOKOT-S) 8.6-50 MG tablet, Take 1 tablet by mouth at bedtime as needed for mild constipation or moderate constipation., Disp: 30 tablet, Rfl: 1   spironolactone (ALDACTONE) 100 MG tablet, Take 1 tablet (100 mg total) by mouth daily., Disp: ,  Rfl:    tamsulosin (FLOMAX) 0.4 MG CAPS capsule, Take 1 capsule (0.4 mg total) by mouth in the morning. (0900), Disp: 30 capsule, Rfl:    tiotropium (SPIRIVA) 18 MCG inhalation capsule, Place 18 mcg into inhaler and inhale daily., Disp: , Rfl:    Zinc Oxide 10 % OINT, Apply 1 Application topically every 6 (six) hours as needed (To buttocks for irritation)., Disp: , Rfl:   Exam: Current vital signs: There were no vitals taken for this visit. Vital signs in last 24 hours:    GENERAL: Awake, alert in NAD HEENT: - Normocephalic and atraumatic, dry mm, no LN++, no Thyromegally LUNGS - Clear to  auscultation bilaterally with no wheezes CV - S1S2 RRR, no m/r/g, equal pulses bilaterally. ABDOMEN - Soft, nontender, nondistended with normoactive BS Ext: warm, well perfused, intact peripheral pulses, no edema  NEURO:  Mental Status: Alert, nonverbal at baseline  Moves eyes to voice bilaterally, does not follow commands Cranial Nerves: PERRL, EOMI, blinks to threat bilaterally, left facial droop, hearing intact, cough and gag intact, head is midline.  Motor: Left arm is contracted Right upper extremity moves antigravity He does not resist gravity with his bilateral lower extremities Tone: Left arm contracted, normal tone and other extremities Sensation-localizes painful stimuli bilaterally Coordination: Does not complete FNF, HKS but no ataxia with general movement Gait- deferred  1a Level of Conscious.: 0 1b LOC Questions: 2 1c LOC Commands: 2 2 Best Gaze: 0 3 Visual: 0 4 Facial Palsy: 1 5a Motor Arm - left: 4 5b Motor Arm - Right: 0 6a Motor Leg - Left: 3 6b Motor Leg - Right: 3 7 Limb Ataxia: 0 8 Sensory: 0 9 Best Language: 3 10 Dysarthria: 2 11 Extinct. and Inatten.: 0 TOTAL: 20   Labs I have reviewed labs in epic and the results pertinent to this consultation are:  CBC    Component Value Date/Time   WBC 5.6 01/14/2023 0430   RBC 3.02 (L) 01/14/2023 0430   HGB 9.9 (L) 01/14/2023 0430   HCT 29.3 (L) 01/14/2023 0430   HCT 21.9 (L) 10/10/2018 0702   PLT 90 (L) 01/14/2023 0430   MCV 97.0 01/14/2023 0430   MCH 32.8 01/14/2023 0430   MCHC 33.8 01/14/2023 0430   RDW 17.2 (H) 01/14/2023 0430   LYMPHSABS 1.3 01/11/2023 1747   MONOABS 1.0 01/11/2023 1747   EOSABS 0.4 01/11/2023 1747   BASOSABS 0.0 01/11/2023 1747    CMP     Component Value Date/Time   NA 140 01/16/2023 0854   K 5.2 (H) 01/16/2023 0854   CL 106 01/16/2023 0854   CO2 25 01/16/2023 0854   GLUCOSE 96 01/16/2023 0854   BUN 24 (H) 01/16/2023 0854   CREATININE 1.13 01/16/2023 0854   CALCIUM  8.3 (L) 01/16/2023 0854   PROT 5.7 (L) 01/13/2023 0440   ALBUMIN <1.5 (L) 01/13/2023 0440   AST 42 (H) 01/13/2023 0440   ALT 22 01/13/2023 0440   ALKPHOS 60 01/13/2023 0440   BILITOT 1.8 (H) 01/13/2023 0440   GFRNONAA >60 01/16/2023 0854   GFRAA >60 10/27/2018 0537    Lipid Panel     Component Value Date/Time   CHOL 111 09/11/2022 0531   TRIG 64 09/11/2022 0531   HDL 24 (L) 09/11/2022 0531   CHOLHDL 4.6 09/11/2022 0531   VLDL 13 09/11/2022 0531   LDLCALC 74 09/11/2022 0531     Imaging I have reviewed the images obtained:  CT-head 1. No hemorrhage or CT  evidence of an acute cortical infarct. 2. Sequela of severe chronic microvascular ischemic change with chronic infarcts in the right corona radiata and centrum semiovale.  Assessment:  62 y.o. male presenting with left facial droop.  At baseline he is nonverbal, total care, and resides in a facility.  He was seen by staff at 930 in his usual state of health and took his morning meds.  When they went to check on him later they found him to have a left facial droop.  This was the only change from his baseline that they noted.  EMS was called and he was brought to the ED.  He should be admitted for a stroke workup.  MRI and CTA head and neck are pending.   Impression: Acute ischemic infarct due to small vessel disease  Recommendations: - Permissive HTN x48 hrs from sx onset or until stroke ruled out by MRI goal BP <220/110. PRN labetalol or hydralazine if BP above these parameters. Avoid oral antihypertensives. - HgbA1c, fasting lipid panel - MRI of the brain without contrast - CTA head and neck - Echocardiogram - Prophylactic therapy-Antiplatelets   ASA 81mg  and Plavix 75mg  daily if platelets are stable - Risk factor modification - Telemetry monitoring - PT consult, OT consult, Speech consult - Stroke team to follow   Patient seen and examined by NP/APP with MD. MD to update note as needed.   Elmer Picker, DNP,  FNP-BC [facility] Pager: [phone]   Attending Neurohospitalist Addendum Patient seen and examined with APP/Resident. Agree with the history and physical as documented above. Agree with the plan as documented, which I helped formulate. I have edited the note above to reflect my full findings and recommendations. I have independently reviewed the chart, obtained history, review of systems and examined the patient.I have personally reviewed pertinent head/neck/spine imaging (CT/MRI). Please feel free to call with any questions.  -- Bing Neighbors, MD [facility] [phone]  If 7pm- 7am, please page neurology on call as listed in AMION.

## 2023-01-23 NOTE — H&P (Signed)
History and Physical   Jacob Bass ZOX:[id number] DOB: [birth year] DOA: 01/23/2023  PCP: Donia Ast., MD   Patient coming from: [facility]  Chief Complaint: Code Stroke  HPI: Jacob Bass is a 62 y.o. male with medical history significant of dementia, stroke with left-sided deficits, CKD 3B, PUD, hypertension, COPD, cirrhosis, anemia, thrombocytopenia presenting as a code stroke.  History obtained with assistance of chart review given patient has baseline dementia and arrives as a code stroke.  Per reports patient was last normal around 9:30 AM or least appeared and then staff returned shortly thereafter to find that he had a left facial droop this prompted EMS call.  Patient does have left-sided contracture/weakness at baseline.  Staff here noted that his pills from this morning were still in his mouth/throat and were suctioned out.  Raises question if his last true normal was significantly earlier than 930 this morning.  Of note, patient has no family listed in chart and has a Proofreader listed however.  Patient is grossly disoriented.  Chart review shows a baseline during past admission he was oriented to person and place only.  Noted to have global aphasia and as above unable to swallow.  Unable to obtain review of systems due to patient's dementia and presumed stroke.  ED Course: Vital signs in the ED notable for blood pressure in the 130s to 150s systolic.  Lab workup included CMP with calcium 8.2, albumin stable 1.7, AST stable at 66, T. bili stable at 2.  CBC with hemoglobin stable 11.2 and platelets stable at 63.  PT, PTT, INR pending.  Lipid panel pending.  Ethanol level pending.  Ammonia level elevated to 137.  Chest x-ray with mild atelectasis versus infiltrate of the left lower lobe.  Abdominal x-ray pending.  CT head showed no acute abnormality and did show chronic infarct.  CTA head neck and MR brain are pending.  Magnesium also pending.  Patient received  lactulose enema and has as needed Ativan ordered for imaging in the ED.  Neurology consulted and are seeing the patient.  Review of Systems: Unable to obtain review of systems due to patient's dementia and presumed stroke.  Past Medical History:  Diagnosis Date   CHF (congestive heart failure) (HCC)    Cirrhosis (HCC)    COPD (chronic obstructive pulmonary disease) (HCC)    ETOH abuse    Gastric ulcer    Hypertension    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   Stroke [facility])     Past Surgical History:  Procedure Laterality Date   BIOPSY  10/19/2018   Procedure: BIOPSY;  Surgeon: Benancio Deeds, MD;  Location: [facility] ENDOSCOPY;  Service: Gastroenterology;;   ESOPHAGOGASTRODUODENOSCOPY (EGD) WITH PROPOFOL N/A 10/19/2018   Procedure: ESOPHAGOGASTRODUODENOSCOPY (EGD);  Surgeon: Benancio Deeds, MD;  Location: [facility] ENDOSCOPY;  Service: Gastroenterology;  Laterality: N/A;   IR PARACENTESIS  09/19/2022   IR PARACENTESIS  09/25/2022   IR PARACENTESIS  10/03/2022   IR PARACENTESIS  10/11/2022   IR PARACENTESIS  10/24/2022   IR PARACENTESIS  10/30/2022   IR PARACENTESIS  12/24/2022    Social History  reports that he quit smoking about 4 months ago. His smoking use included cigarettes. He smoked an average of 2 packs per day. He has never used smokeless tobacco. He reports current alcohol use. He reports that he does not use drugs.  Allergies  Allergen Reactions   Penicillins Other (See Comments)    Unknown reaction Listed on MAR    No  family history on file.  Prior to Admission medications   Medication Sig Start Date End Date Taking? Authorizing Provider  albuterol (VENTOLIN HFA) 108 (90 Base) MCG/ACT inhaler Inhale 2 puffs into the lungs every 4 (four) hours as needed for wheezing or shortness of breath.    [provider]  ciprofloxacin (CIPRO) 500 MG tablet Take 1 tablet (500 mg total) by mouth daily at 8 pm. 01/15/23   [name], MD  cyanocobalamin (VITAMIN B12) 1000  MCG tablet Take 1,000 mcg by mouth in the morning. (0900)    [provider]  feeding supplement, ENSURE ENLIVE, (ENSURE ENLIVE) LIQD Take 237 mLs by mouth daily at 3 pm. 10/28/18   [name], MD  fluticasone-salmeterol (ADVAIR) 250-50 MCG/ACT AEPB Inhale 1 puff into the lungs in the morning and at bedtime. 01/15/23   [name], MD  folic acid (FOLVITE) 1 MG tablet Take 1 tablet (1 mg total) by mouth daily. 10/28/18   [name], MD  furosemide (LASIX) 20 MG tablet Take 3 tablets (60 mg total) by mouth daily. 01/16/23   [name], MD  lactulose (CHRONULAC) 10 GM/15ML solution Take 15 mLs (10 g total) by mouth 2 (two) times daily. 10/26/22   [name], MD  latanoprost (XALATAN) 0.005 % ophthalmic solution Place 1 drop into both eyes at bedtime. (0900) 01/15/23   [name], MD  magnesium oxide (MAG-OX) 400 (240 Mg) MG tablet Take 1 tablet (400 mg total) by mouth 3 (three) times daily. 01/15/23   [name], MD  Nutritional Supplements (NUTRITIONAL DRINK) LIQD Take 120 mLs by mouth 3 (three) times daily. House supplement (0900, 1400, 2100)    [provider]  omeprazole (PRILOSEC OTC) 20 MG tablet Take 40 mg by mouth 2 (two) times daily.    [provider]  senna-docusate (SENOKOT-S) 8.6-50 MG tablet Take 1 tablet by mouth at bedtime as needed for mild constipation or moderate constipation. 10/26/22   [name], MD  spironolactone (ALDACTONE) 100 MG tablet Take 1 tablet (100 mg total) by mouth daily. 01/16/23   [name], MD  tamsulosin (FLOMAX) 0.4 MG CAPS capsule Take 1 capsule (0.4 mg total) by mouth in the morning. (0900) 01/15/23   [name], MD  tiotropium (SPIRIVA) 18 MCG inhalation capsule Place 18 mcg into inhaler and inhale daily.    [provider]  Zinc Oxide 10 % OINT Apply 1 Application topically every 6 (six) hours as needed (To buttocks for irritation).    [provider]     Physical Exam: Vitals:   01/23/23 1200 01/23/23 1230 01/23/23 1245  BP: (!) 153/86 136/84   Pulse: 65 97 (!) 57  Resp: 16 20 18   Temp: 97.6 F (36.4 C)    TempSrc: Axillary    SpO2: (!) 58% (!) 88% 96%    Physical Exam Constitutional:      General: He is not in acute distress.    Appearance: Normal appearance. He is ill-appearing (Fidgety, somewhat uncomfortable appearing).  HENT:     Head: Normocephalic and atraumatic.     Mouth/Throat:     Mouth: Mucous membranes are moist.     Pharynx: Oropharynx is clear.  Eyes:     Extraocular Movements: Extraocular movements intact.     Pupils: Pupils are equal, round, and reactive to light.  Cardiovascular:     Rate and Rhythm: Normal rate and regular rhythm.     Pulses: Normal pulses.  Heart sounds: Normal heart sounds.  Pulmonary:     Effort: Pulmonary effort is normal. No respiratory distress.     Breath sounds: Normal breath sounds.  Abdominal:     General: Bowel sounds are normal. There is no distension.     Palpations: Abdomen is soft.     Tenderness: There is no abdominal tenderness.  Musculoskeletal:        General: No swelling or deformity.     Right lower leg: Edema present.     Left lower leg: Edema present.  Skin:    General: Skin is warm and dry.  Neurological:     Comments: Mental Status: Patient is awake, alert Global aphasia: Pupils equal, round Unable to follow commands. Left upper extremity contracture, moving right upper and lower extremities.    Labs on Admission: I have personally reviewed following labs and imaging studies  CBC: Recent Labs  Lab 01/23/23 1135 01/23/23 1141  WBC 5.3  --   NEUTROABS 2.9  --   HGB 11.2* 11.6*  HCT 32.6* 34.0*  MCV 94.5  --   PLT 63*  --     Basic Metabolic Panel: Recent Labs  Lab 01/23/23 1135 01/23/23 1141  NA 136 141  K 4.1 4.2  CL 103 102  CO2 25  --   GLUCOSE 90 86  BUN 20 22  CREATININE 1.19 1.00  CALCIUM 8.2*  --      GFR: Estimated Creatinine Clearance: 69.4 mL/min (by C-G formula based on SCr of 1 mg/dL).  Liver Function Tests: Recent Labs  Lab 01/23/23 1135  AST 66*  ALT 38  ALKPHOS 75  BILITOT 2.0*  PROT 7.0  ALBUMIN 1.7*    Urine analysis:    Component Value Date/Time   COLORURINE AMBER (A) 10/02/2022 0521   APPEARANCEUR HAZY (A) 10/02/2022 0521   LABSPEC 1.015 10/02/2022 0521   PHURINE 5.0 10/02/2022 0521   GLUCOSEU NEGATIVE 10/02/2022 0521   HGBUR NEGATIVE 10/02/2022 0521   BILIRUBINUR NEGATIVE 10/02/2022 0521   KETONESUR NEGATIVE 10/02/2022 0521   PROTEINUR NEGATIVE 10/02/2022 0521   NITRITE NEGATIVE 10/02/2022 0521   LEUKOCYTESUR NEGATIVE 10/02/2022 0521    Radiological Exams on Admission: DG Abdomen 1 View  Result Date: 01/23/2023 CLINICAL DATA:  Evaluate for foreign bodies prior to MRI. EXAM: ABDOMEN - 1 VIEW COMPARISON:  September 14, 2022. FINDINGS: The bowel gas pattern is normal. No radio-opaque calculi or other significant radiographic abnormality are seen. IMPRESSION: No radiopaque foreign body is noted.  No abnormal bowel dilatation. Aortic Atherosclerosis (ICD10-I70.0). Electronically Signed   By: Lupita Raider M.D.   On: 01/23/2023 14:28   DG Chest Portable 1 View  Result Date: 01/23/2023 CLINICAL DATA:  Altered mental status EXAM: PORTABLE CHEST 1 VIEW COMPARISON:  01/11/2023 FINDINGS: Heart size is normal. Aortic atherosclerosis as seen previously. Right lung is clear. Question mild atelectasis or infiltrate at the left lung base. Patient's left arm overlies that region to some degree. IMPRESSION: Question mild atelectasis or infiltrate at the left lung base. Electronically Signed   By: Paulina Fusi M.D.   On: 01/23/2023 12:41   CT HEAD CODE STROKE WO CONTRAST  Result Date: 01/23/2023 CLINICAL DATA:  Code stroke.  Left-sided facial droop EXAM: CT HEAD WITHOUT CONTRAST TECHNIQUE: Contiguous axial images were obtained from the base of the skull through the vertex  without intravenous contrast. RADIATION DOSE REDUCTION: This exam was performed according to the departmental dose-optimization program which includes automated exposure control, adjustment of the  mA and/or kV according to patient size and/or use of iterative reconstruction technique. COMPARISON:  MRI Brain 09/10/22 FINDINGS: Brain: No evidence of acute infarction, hemorrhage, hydrocephalus, extra-axial collection or mass lesion/mass effect. There is sequela of severe chronic microvascular ischemic change with a extensive periventricular hypodensity, as seen on prior brain MRI dated 09/10/2022. There are chronic infarcts in the right corona radiata and centrum semiovale. Vascular: No hyperdense vessel or unexpected calcification. Skull: Normal. Negative for fracture or focal lesion. Sinuses/Orbits: No middle ear or mastoid effusion. Paranasal sinuses are clear. Orbits are unremarkable. Other: None. ASPECTS [facility] Stroke Program Early CT Score): 10 when accounting for chronic findings. IMPRESSION: 1. No hemorrhage or CT evidence of an acute cortical infarct. 2. Sequela of severe chronic microvascular ischemic change with chronic infarcts in the right corona radiata and centrum semiovale. Findings were discussed with Dr. Selina Cooley on 01/23/23 at 12:13 PM. Electronically Signed   By: Lorenza Cambridge M.D.   On: 01/23/2023 12:16    EKG: Independently reviewed.  Sinus rhythm at 83 bpm.  Baseline artifact and significant baseline wander.  QTc prolonged at 555.  Repeat EKG looks similar.  Assessment/Plan Active Problems:   Cirrhosis of liver without ascites (HCC)   Hypertension   History of CVA (cerebrovascular accident)   Anemia   PUD (peptic ulcer disease)   Stage 3b chronic kidney disease (CKD) (HCC)   Acute CVA (cerebrovascular accident) (HCC)   Acute CVA History of CVA Dementia Altered mental status > Patient presenting from facility, [facility], after staff noted left facial droop.  Reportedly who was  last normal around 930 however he may have been less normal earlier than this considering his morning medication was found still lodged in his mouth/throat by staff here; these were able to be suctioned out. > Due to patient's baseline dementia and new deficits unable to obtain significant history. > Previous history of subarachnoid hemorrhage and stroke with some residual left-sided weakness and contracture.  Not previously on dual antiplatelet therapy due to thrombocytopenia > Has baseline dementia and during recent admission was oriented to person and place only.  Unclear at what point he became completely disoriented based on the story so far. > As below, did have elevated ammonia which could be complicating picture some so will continue with lactulose enemas while patient remains n.p.o. > Neurology consulted in the ED and are following - Appreciate neurology recommendations - Allow for permissive HTN (systolic < 220 and diastolic < 120)  - Plan for antiplatelet therapy once tolerating p.o. - Echocardiogram  - CTA head & neck  - A1C  - Lipid panel  - Tele monitoring  - SLP eval - PT/OT - Continue lactulose enemas  Cirrhosis Thrombocytopenia > Known history of cirrhosis with recurrent ascites/edema on diuretics and status post multiple paracentesis. > History of alcohol use and hepatitis C. > Currently AST stable at 66, albumin stable 1.7, T. bili stable at 2, platelets stable at 63. > Lactulose elevated to 137 as above. - Holding Lasix and spironolactone during permissive hypertension as above and while patient remains n.p.o. - Continue with lactulose enemas - Trend liver function and CBC  COPD - Replace home Advair with formulary Dulera - As needed albuterol  Anemia > Hemoglobin stable 11.2 - Trend CBC  CKD 3B > Creatinine stable in the ED. - Trend renal function and electrolytes  History of PUD - Holding PPI while patient n.p.o.  Hypertension - Hold antihypertensives  as above  DVT prophylaxis: SCDs Code Status:  Full, ward of the state Family Communication:  None, ward of the state, left message with DSS/case manager Disposition Plan:   Patient is from:  [facility]  Anticipated DC to:  Same as above versus other placement  Anticipated DC date:  1 to 7 days  Anticipated DC barriers: Possible need for higher level of placement or progression to hospice, currently not swallowing  Consults called:  Neurology consulted in the ED Admission status:  Observation, telemetry  Severity of Illness: The appropriate patient status for this patient is OBSERVATION. Observation status is judged to be reasonable and necessary in order to provide the required intensity of service to ensure the patient's safety. The patient's presenting symptoms, physical exam findings, and initial radiographic and laboratory data in the context of their medical condition is felt to place them at decreased risk for further clinical deterioration. Furthermore, it is anticipated that the patient will be medically stable for discharge from the hospital within 2 midnights of admission.    Synetta Fail MD [facility]  How to contact the [facility] Attending or Consulting provider 7A - 7P or covering provider during after hours 7P -7A, for this patient?   Check the care team in [facility] and look for a) attending/consulting [facility] provider listed and b) the [facility] team listed Log into [email or web address] and use [facility]'s universal password to access. If you do not have the password, please contact the hospital operator. Locate the [facility] provider you are looking for under [facility] and page to a number that you can be directly reached. If you still have difficulty reaching the provider, please page the [facility] (Director on Call) for the Hospitalists listed on amion for assistance.  01/23/2023, 2:52 PM

## 2023-01-23 NOTE — ED Notes (Signed)
IV team at bedside 

## 2023-01-23 NOTE — Progress Notes (Signed)
Patient brought to MRI and was attempted. He was rolling around and grabbing at things. Unable to hold still and follow instructions. Needs to either be medicated or done under anesthesia if MRI needed. RN was notified and stated she will contact the Doctor.

## 2023-01-23 NOTE — ED Notes (Signed)
Wasted 0.80mL/1mg  of Ativan in the med room with Manya Silvas and Ecologist.

## 2023-01-23 NOTE — ED Notes (Signed)
ED TO INPATIENT HANDOFF REPORT  ED Nurse Name and Phone #:   S Name/Age/Gender Jacob Bass 62 y.o. male Room/Bed: 039C/039C  Code Status   Code Status: Full Code  Home/SNF/Other Skilled nursing facility  Is this baseline? Yes   Triage Complete: Triage complete  Chief Complaint Acute CVA (cerebrovascular accident) [facility]) [I63.9]  Triage Note Pt coming from [facility], caretakers said his LKW was at 0930. Left sided facial droop was noticed. Q30 NIH  Non verbal at baseline, hx of dementia, contracted left arm.    Allergies Allergies  Allergen Reactions   Penicillins Other (See Comments)    Unknown reaction Listed on MAR    Level of Care/Admitting Diagnosis ED Disposition     ED Disposition  Admit   Condition  --   Comment  Hospital Area: [facility] [[id number]]  Level of Care: Telemetry Medical [104]  May place patient in observation at [facility] or [facility] if equivalent level of care is available:: No  Covid Evaluation: Asymptomatic - no recent exposure (last 10 days) testing not required  Diagnosis: Acute CVA (cerebrovascular accident) [facility]) [[id number]]  Admitting Physician: Synetta Fail [[id number]]  Attending Physician: Synetta Fail [[id number]]          B Medical/Surgery History Past Medical History:  Diagnosis Date   CHF (congestive heart failure) (HCC)    Cirrhosis (HCC)    COPD (chronic obstructive pulmonary disease) (HCC)    ETOH abuse    Gastric ulcer    Hypertension    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   Stroke [facility])    Past Surgical History:  Procedure Laterality Date   BIOPSY  10/19/2018   Procedure: BIOPSY;  Surgeon: Benancio Deeds, MD;  Location: [facility] ENDOSCOPY;  Service: Gastroenterology;;   ESOPHAGOGASTRODUODENOSCOPY (EGD) WITH PROPOFOL N/A 10/19/2018   Procedure: ESOPHAGOGASTRODUODENOSCOPY (EGD);  Surgeon: Benancio Deeds, MD;  Location: [facility] ENDOSCOPY;  Service: Gastroenterology;  Laterality:  N/A;   IR PARACENTESIS  09/19/2022   IR PARACENTESIS  09/25/2022   IR PARACENTESIS  10/03/2022   IR PARACENTESIS  10/11/2022   IR PARACENTESIS  10/24/2022   IR PARACENTESIS  10/30/2022   IR PARACENTESIS  12/24/2022     A IV Location/Drains/Wounds Patient Lines/Drains/Airways Status     Active Line/Drains/Airways     Name Placement date Placement time Site Days   Peripheral IV 01/11/23 22 G Anterior;Right Forearm 01/11/23  2030  Forearm  12   Peripheral IV 01/23/23 20 G Anterior;Distal;Right;Upper Arm 01/23/23  1224  Arm  less than 1   Pressure Injury 09/24/22 Heel Left;Posterior Deep Tissue Pressure Injury - Purple or maroon localized area of discolored intact skin or blood-filled blister due to damage of underlying soft tissue from pressure and/or shear. Round, dark brown spot 09/24/22  0903  -- 121   Pressure Injury 10/02/22 Thigh Left;Posterior;Proximal Stage 2 -  Partial thickness loss of dermis presenting as a shallow open injury with a red, pink wound bed without slough. Pink 10/02/22  0700  -- 113   Wound / Incision (Open or Dehisced) 09/24/22 Irritant Dermatitis (Moisture Associated Skin Damage) Buttocks Distal Redness and excoriation inner buttocks, and thighs. 09/24/22  1400  Buttocks  121   Wound / Incision (Open or Dehisced) 09/29/22 Skin tear Knee Anterior;Left PInk and painful 09/29/22  0136  Knee  116   Wound / Incision (Open or Dehisced) 09/29/22 Skin tear Knee Anterior;Left 09/29/22  0130  Knee  116   Wound / Incision (Open  or Dehisced) 10/02/22 Non-pressure wound Abdomen Left;Lower Pink 10/02/22  0700  Abdomen  113            Intake/Output Last 24 hours No intake or output data in the 24 hours ending 01/23/23 1514  Labs/Imaging Results for orders placed or performed during the hospital encounter of 01/23/23 (from the past 48 hour(s))  CBC     Status: Abnormal   Collection Time: 01/23/23 11:35 AM  Result Value Ref Range   WBC 5.3 4.0 - 10.5 K/uL   RBC 3.45 (L) 4.22 -  5.81 MIL/uL   Hemoglobin 11.2 (L) 13.0 - 17.0 g/dL   HCT 16.1 (L) 09.6 - 04.5 %   MCV 94.5 80.0 - 100.0 fL   MCH 32.5 26.0 - 34.0 pg   MCHC 34.4 30.0 - 36.0 g/dL   RDW 40.9 (H) 81.1 - 91.4 %   Platelets 63 (L) 150 - 400 K/uL    Comment: Immature Platelet Fraction may be clinically indicated, consider ordering this additional test [id number] CONSISTENT WITH PREVIOUS RESULT REPEATED TO VERIFY    nRBC 0.0 0.0 - 0.2 %    Comment: Performed at [facility], [address], [place], [place] [address]  Differential     Status: None   Collection Time: 01/23/23 11:35 AM  Result Value Ref Range   Neutrophils Relative % 53 %   Neutro Abs 2.9 1.7 - 7.7 K/uL   Lymphocytes Relative 23 %   Lymphs Abs 1.2 0.7 - 4.0 K/uL   Monocytes Relative 18 %   Monocytes Absolute 1.0 0.1 - 1.0 K/uL   Eosinophils Relative 4 %   Eosinophils Absolute 0.2 0.0 - 0.5 K/uL   Basophils Relative 1 %   Basophils Absolute 0.0 0.0 - 0.1 K/uL   Immature Granulocytes 1 %   Abs Immature Granulocytes 0.04 0.00 - 0.07 K/uL    Comment: Performed at [facility], [address], [place], [place] [address]  Comprehensive metabolic panel     Status: Abnormal   Collection Time: 01/23/23 11:35 AM  Result Value Ref Range   Sodium 136 135 - 145 mmol/L   Potassium 4.1 3.5 - 5.1 mmol/L   Chloride 103 98 - 111 mmol/L   CO2 25 22 - 32 mmol/L   Glucose, Bld 90 70 - 99 mg/dL    Comment: Glucose reference range applies only to samples taken after fasting for at least 8 hours.   BUN 20 8 - 23 mg/dL   Creatinine, Ser 6.29 0.61 - 1.24 mg/dL   Calcium 8.2 (L) 8.9 - 10.3 mg/dL   Total Protein 7.0 6.5 - 8.1 g/dL   Albumin 1.7 (L) 3.5 - 5.0 g/dL   AST 66 (H) 15 - 41 U/L   ALT 38 0 - 44 U/L   Alkaline Phosphatase 75 38 - 126 U/L   Total Bilirubin 2.0 (H) 0.3 - 1.2 mg/dL   GFR, Estimated >52 >84 mL/min    Comment: (NOTE) Calculated using the CKD-EPI Creatinine Equation (2021)    Anion gap 8 5 - 15    Comment: Performed  at [facility], [address], [place], [place] [address]  CBG monitoring, ED     Status: None   Collection Time: 01/23/23 11:36 AM  Result Value Ref Range   Glucose-Capillary 91 70 - 99 mg/dL    Comment: Glucose reference range applies only to samples taken after fasting for at least 8 hours.  I-stat chem 8, ED  Status: Abnormal   Collection Time: 01/23/23 11:41 AM  Result Value Ref Range   Sodium 141 135 - 145 mmol/L   Potassium 4.2 3.5 - 5.1 mmol/L   Chloride 102 98 - 111 mmol/L   BUN 22 8 - 23 mg/dL   Creatinine, Ser 1.19 0.61 - 1.24 mg/dL   Glucose, Bld 86 70 - 99 mg/dL    Comment: Glucose reference range applies only to samples taken after fasting for at least 8 hours.   Calcium, Ion 1.16 1.15 - 1.40 mmol/L   TCO2 31 22 - 32 mmol/L   Hemoglobin 11.6 (L) 13.0 - 17.0 g/dL   HCT 14.7 (L) 82.9 - 56.2 %  Ammonia     Status: Abnormal   Collection Time: 01/23/23 12:30 PM  Result Value Ref Range   Ammonia 137 (H) 9 - 35 umol/L    Comment: HEMOLYSIS AT THIS LEVEL MAY AFFECT RESULT Performed at [facility], [address] [place], [place] [address]    DG Abdomen 1 View  Result Date: 01/23/2023 CLINICAL DATA:  Evaluate for foreign bodies prior to MRI. EXAM: ABDOMEN - 1 VIEW COMPARISON:  September 14, 2022. FINDINGS: The bowel gas pattern is normal. No radio-opaque calculi or other significant radiographic abnormality are seen. IMPRESSION: No radiopaque foreign body is noted.  No abnormal bowel dilatation. Aortic Atherosclerosis (ICD10-I70.0). Electronically Signed   By: Lupita Raider M.D.   On: 01/23/2023 14:28   DG Chest Portable 1 View  Result Date: 01/23/2023 CLINICAL DATA:  Altered mental status EXAM: PORTABLE CHEST 1 VIEW COMPARISON:  01/11/2023 FINDINGS: Heart size is normal. Aortic atherosclerosis as seen previously. Right lung is clear. Question mild atelectasis or infiltrate at the left lung base. Patient's left arm overlies that region to some degree.  IMPRESSION: Question mild atelectasis or infiltrate at the left lung base. Electronically Signed   By: Paulina Fusi M.D.   On: 01/23/2023 12:41   CT HEAD CODE STROKE WO CONTRAST  Result Date: 01/23/2023 CLINICAL DATA:  Code stroke.  Left-sided facial droop EXAM: CT HEAD WITHOUT CONTRAST TECHNIQUE: Contiguous axial images were obtained from the base of the skull through the vertex without intravenous contrast. RADIATION DOSE REDUCTION: This exam was performed according to the departmental dose-optimization program which includes automated exposure control, adjustment of the mA and/or kV according to patient size and/or use of iterative reconstruction technique. COMPARISON:  MRI Brain 09/10/22 FINDINGS: Brain: No evidence of acute infarction, hemorrhage, hydrocephalus, extra-axial collection or mass lesion/mass effect. There is sequela of severe chronic microvascular ischemic change with a extensive periventricular hypodensity, as seen on prior brain MRI dated 09/10/2022. There are chronic infarcts in the right corona radiata and centrum semiovale. Vascular: No hyperdense vessel or unexpected calcification. Skull: Normal. Negative for fracture or focal lesion. Sinuses/Orbits: No middle ear or mastoid effusion. Paranasal sinuses are clear. Orbits are unremarkable. Other: None. ASPECTS [facility] Stroke Program Early CT Score): 10 when accounting for chronic findings. IMPRESSION: 1. No hemorrhage or CT evidence of an acute cortical infarct. 2. Sequela of severe chronic microvascular ischemic change with chronic infarcts in the right corona radiata and centrum semiovale. Findings were discussed with Dr. Selina Cooley on 01/23/23 at 12:13 PM. Electronically Signed   By: Lorenza Cambridge M.D.   On: 01/23/2023 12:16    Pending Labs Unresulted Labs (From admission, onward)     Start     Ordered   01/24/23 0500  Lipid panel  (Labs)  Tomorrow morning,   R  Comments: Fasting    01/23/23 1149   01/24/23 0500  Hemoglobin A1c   (Labs)  Tomorrow morning,   R       Comments: To assess prior glycemic control    01/23/23 1439   01/23/23 1219  Magnesium  Once,   STAT        01/23/23 1218   01/23/23 1200  Protime-INR  Once,   STAT        01/23/23 1200   01/23/23 1200  APTT  Once,   STAT        01/23/23 1200   01/23/23 1135  Ethanol  (Stroke Panel (PNL))  Once,   URGENT        01/23/23 1134            Vitals/Pain Today's Vitals   01/23/23 1200 01/23/23 1230 01/23/23 1245 01/23/23 1445  BP: (!) 153/86 136/84  135/67  Pulse: 65 97 (!) 57 82  Resp: 16 20 18 18   Temp: 97.6 F (36.4 C)     TempSrc: Axillary     SpO2: (!) 58% (!) 88% 96% 100%    Isolation Precautions No active isolations  Medications Medications   stroke: early stages of recovery book (has no administration in time range)  lactulose (CHRONULAC) enema 200 gm (has no administration in time range)  0.9 %  sodium chloride infusion (has no administration in time range)  mometasone-formoterol (DULERA) 200-5 MCG/ACT inhaler 2 puff (has no administration in time range)  acetaminophen (TYLENOL) tablet 650 mg (has no administration in time range)    Or  acetaminophen (TYLENOL) 160 MG/5ML solution 650 mg (has no administration in time range)    Or  acetaminophen (TYLENOL) suppository 650 mg (has no administration in time range)  albuterol (PROVENTIL) (2.5 MG/3ML) 0.083% nebulizer solution 2.5 mg (has no administration in time range)    Mobility non-ambulatory     Focused Assessments Neuro Assessment Handoff:  Swallow screen pass? No    NIH Stroke Scale  Dizziness Present:  (UTA) Headache Present:  (UTA) Interval: Initial Level of Consciousness (1a.)   : Alert, keenly responsive LOC Questions (1b. )   : Answers neither question correctly LOC Commands (1c. )   : Performs neither task correctly Best Gaze (2. )  : Normal Visual (3. )  : No visual loss Facial Palsy (4. )    : Minor paralysis Motor Arm, Left (5a. )   : No  movement Motor Arm, Right (5b. ) : No drift Motor Leg, Left (6a. )  : No effort against gravity Motor Leg, Right (6b. ) : No effort against gravity Limb Ataxia (7. ): Absent Sensory (8. )  : Normal, no sensory loss Best Language (9. )  : Mute, global aphasia Dysarthria (10. ): Severe dysarthria, patient's speech is so slurred as to be unintelligible in the absence of or out of proportion to any dysphasia, or is mute/anarthric Extinction/Inattention (11.)   : No Abnormality Complete NIHSS TOTAL: 20 Last date known well: 01/23/23 Last time known well: 0930 Neuro Assessment:   Neuro Checks:   Initial (01/23/23 1200)  Has TPA been given? No If patient is a Neuro Trauma and patient is going to OR before floor call report to 4N Charge nurse: [phone] or [phone]   R Recommendations: See Admitting Provider Note  Report given to:   Additional Notes:

## 2023-01-23 NOTE — Code Documentation (Signed)
Stroke Response Nurse Documentation Code Documentation  Jacob Bass is a 61 y.o. male arriving to [facility]  via [facility] on 01/23/23 with past medical hx of alcoholic liver cirrhosis, HCV, history of SBP, portal gastropathy, thrombocytopenia, pleural effusion, recurrent ascites, stroke with residual left-sided weakness, dementia, anemia, diverticular GI bleed, CKD stage 2-3a, COPD, hypertension, . On No antithrombotic. Code stroke was activated by EMS.   Patient from [facility] where he was LKW at 0930 and now complaining of left facial droop. Pt was seen by staff normal at 0930 and when they returned he had a facial droop. Patient is nonverbal, total care, and contracted on the left at baseline.   Stroke team at the bedside on patient arrival. Labs drawn and patient cleared for CT by Dr. Criss Alvine. Patient to CT with team. NIHSS 20, see documentation for details and code stroke times. Patient with disoriented, not following commands, left facial droop, left arm weakness, bilateral leg weakness, Global aphasia , and dysarthria  on exam. The following imaging was completed:  CT Head. Patient is not a candidate for IV Thrombolytic due to "too good to treat". Patient is not a candidate for IR due to no suspected LVO and MRS 5.   Care Plan: MRI, Q30 Neuro/Vitals while in the window for TNK until 1400 or until MRI rules out stroke.   Bedside handoff with ED RN Morrie Sheldon.    Jacob Bass, Jacob Bass  Stroke Response RN

## 2023-01-23 NOTE — ED Triage Notes (Signed)
Pt coming from [facility], caretakers said his LKW was at 0930. Left sided facial droop was noticed. Q30 NIH  Non verbal at baseline, hx of dementia, contracted left arm.

## 2023-01-23 NOTE — ED Provider Notes (Signed)
[place] EMERGENCY DEPARTMENT AT [facility] Provider Note   CSN: [id number] Arrival date & time: 01/23/23  1132     History  Chief Complaint  Patient presents with   Code Stroke    Jacob Bass is a 62 y.o. male.  HPI 62 year old male presents as a code stroke.  History is from EMS.  The patient was last seen normal around 9:30 according to the facility and then developed a facial droop (left sided) when they came back and checked on him.  He has a chronic left arm contracture.  No other focal deficits per EMS.  Patient is unable to provide any significant history. EMS noted some medicine from this morning still in his mouth.  Home Medications Prior to Admission medications   Medication Sig Start Date End Date Taking? Authorizing Provider  albuterol (VENTOLIN HFA) 108 (90 Base) MCG/ACT inhaler Inhale 2 puffs into the lungs every 4 (four) hours as needed for wheezing or shortness of breath.    [provider]  ciprofloxacin (CIPRO) 500 MG tablet Take 1 tablet (500 mg total) by mouth daily at 8 pm. 01/15/23   [name], MD  cyanocobalamin (VITAMIN B12) 1000 MCG tablet Take 1,000 mcg by mouth in the morning. (0900)    [provider]  feeding supplement, ENSURE ENLIVE, (ENSURE ENLIVE) LIQD Take 237 mLs by mouth daily at 3 pm. 10/28/18   [name], MD  fluticasone-salmeterol (ADVAIR) 250-50 MCG/ACT AEPB Inhale 1 puff into the lungs in the morning and at bedtime. 01/15/23   [name], MD  folic acid (FOLVITE) 1 MG tablet Take 1 tablet (1 mg total) by mouth daily. 10/28/18   [name], MD  furosemide (LASIX) 20 MG tablet Take 3 tablets (60 mg total) by mouth daily. 01/16/23   [name], MD  lactulose (CHRONULAC) 10 GM/15ML solution Take 15 mLs (10 g total) by mouth 2 (two) times daily. 10/26/22   [name], MD  latanoprost (XALATAN) 0.005 % ophthalmic solution Place 1 drop into both eyes at bedtime. (0900) 01/15/23   [name], MD  magnesium oxide (MAG-OX) 400 (240 Mg) MG tablet Take 1 tablet (400 mg total) by mouth 3 (three) times daily. 01/15/23   [name], MD  Nutritional Supplements (NUTRITIONAL DRINK) LIQD Take 120 mLs by mouth 3 (three) times daily. House supplement (0900, 1400, 2100)    [provider]  omeprazole (PRILOSEC OTC) 20 MG tablet Take 40 mg by mouth 2 (two) times daily.    [provider]  senna-docusate (SENOKOT-S) 8.6-50 MG tablet Take 1 tablet by mouth at bedtime as needed for mild constipation or moderate constipation. 10/26/22   [name], MD  spironolactone (ALDACTONE) 100 MG tablet Take 1 tablet (100 mg total) by mouth daily. 01/16/23   [name], MD  tamsulosin (FLOMAX) 0.4 MG CAPS capsule Take 1 capsule (0.4 mg total) by mouth in the morning. (0900) 01/15/23   [name], MD  tiotropium (SPIRIVA) 18 MCG inhalation capsule Place 18 mcg into inhaler and inhale daily.    [provider]  Zinc Oxide 10 % OINT Apply 1 Application topically every 6 (six) hours as needed (To buttocks for irritation).    [provider]      Allergies    Penicillins    Review of Systems   Review of Systems  Unable to perform ROS: Patient nonverbal    Physical Exam Updated Vital Signs BP  135/67 (BP Location: Right Arm)   Pulse 82   Temp 97.6 F (36.4 C) (Axillary)   Resp 18   SpO2 100%  Physical Exam Vitals and nursing note reviewed.  Constitutional:      Appearance: He is well-developed.  HENT:     Head: Normocephalic and atraumatic.  Cardiovascular:     Rate and Rhythm: Normal rate and regular rhythm.     Heart sounds: Normal heart sounds.  Pulmonary:     Effort: Pulmonary effort is normal.     Breath sounds: Normal breath sounds.  Abdominal:     General: There is distension.     Palpations: Abdomen is soft.     Tenderness: There is no abdominal tenderness.  Musculoskeletal:     Right lower leg: Edema present.      Left lower leg: Edema present.  Skin:    General: Skin is warm and dry.  Neurological:     Mental Status: He is alert.     Comments: Awake but does not really follow commands.  Left arm is contracted.  Left leg minimally moves.  Right sided extremities move though he is not following commands but is purposeful.     ED Results / Procedures / Treatments   Labs (all labs ordered are listed, but only abnormal results are displayed) Labs Reviewed  CBC - Abnormal; Notable for the following components:      Result Value   RBC 3.45 (*)    Hemoglobin 11.2 (*)    HCT 32.6 (*)    RDW 17.0 (*)    Platelets 63 (*)    All other components within normal limits  COMPREHENSIVE METABOLIC PANEL - Abnormal; Notable for the following components:   Calcium 8.2 (*)    Albumin 1.7 (*)    AST 66 (*)    Total Bilirubin 2.0 (*)    All other components within normal limits  AMMONIA - Abnormal; Notable for the following components:   Ammonia 137 (*)    All other components within normal limits  I-STAT CHEM 8, ED - Abnormal; Notable for the following components:   Hemoglobin 11.6 (*)    HCT 34.0 (*)    All other components within normal limits  DIFFERENTIAL  ETHANOL  PROTIME-INR  APTT  MAGNESIUM  CBG MONITORING, ED    EKG EKG Interpretation  Date/Time:  Wednesday Jan 23 2023 11:58:39 EDT Ventricular Rate:  83 PR Interval:  151 QRS Duration: 87 QT Interval:  475 QTC Calculation: 555 R Axis:   28 Text Interpretation: Sinus rhythm Low voltage, extremity and precordial leads Repol abnrm suggests ischemia, diffuse leads Prolonged QT interval Poor data quality in current ECG precludes serial comparison Confirmed by Pricilla Loveless [phone]) on 01/23/2023 12:05:28 PM  Radiology DG Abdomen 1 View  Result Date: 01/23/2023 CLINICAL DATA:  Evaluate for foreign bodies prior to MRI. EXAM: ABDOMEN - 1 VIEW COMPARISON:  September 14, 2022. FINDINGS: The bowel gas pattern is normal. No radio-opaque calculi  or other significant radiographic abnormality are seen. IMPRESSION: No radiopaque foreign body is noted.  No abnormal bowel dilatation. Aortic Atherosclerosis (ICD10-I70.0). Electronically Signed   By: Lupita Raider M.D.   On: 01/23/2023 14:28   DG Chest Portable 1 View  Result Date: 01/23/2023 CLINICAL DATA:  Altered mental status EXAM: PORTABLE CHEST 1 VIEW COMPARISON:  01/11/2023 FINDINGS: Heart size is normal. Aortic atherosclerosis as seen previously. Right lung is clear. Question mild atelectasis or infiltrate at the left lung base. Patient's  left arm overlies that region to some degree. IMPRESSION: Question mild atelectasis or infiltrate at the left lung base. Electronically Signed   By: Paulina Fusi M.D.   On: 01/23/2023 12:41   CT HEAD CODE STROKE WO CONTRAST  Result Date: 01/23/2023 CLINICAL DATA:  Code stroke.  Left-sided facial droop EXAM: CT HEAD WITHOUT CONTRAST TECHNIQUE: Contiguous axial images were obtained from the base of the skull through the vertex without intravenous contrast. RADIATION DOSE REDUCTION: This exam was performed according to the departmental dose-optimization program which includes automated exposure control, adjustment of the mA and/or kV according to patient size and/or use of iterative reconstruction technique. COMPARISON:  MRI Brain 09/10/22 FINDINGS: Brain: No evidence of acute infarction, hemorrhage, hydrocephalus, extra-axial collection or mass lesion/mass effect. There is sequela of severe chronic microvascular ischemic change with a extensive periventricular hypodensity, as seen on prior brain MRI dated 09/10/2022. There are chronic infarcts in the right corona radiata and centrum semiovale. Vascular: No hyperdense vessel or unexpected calcification. Skull: Normal. Negative for fracture or focal lesion. Sinuses/Orbits: No middle ear or mastoid effusion. Paranasal sinuses are clear. Orbits are unremarkable. Other: None. ASPECTS [facility] Stroke Program Early CT  Score): 10 when accounting for chronic findings. IMPRESSION: 1. No hemorrhage or CT evidence of an acute cortical infarct. 2. Sequela of severe chronic microvascular ischemic change with chronic infarcts in the right corona radiata and centrum semiovale. Findings were discussed with Dr. Selina Cooley on 01/23/23 at 12:13 PM. Electronically Signed   By: Lorenza Cambridge M.D.   On: 01/23/2023 12:16    Procedures Procedures    Medications Ordered in ED Medications   stroke: early stages of recovery book (has no administration in time range)  lactulose (CHRONULAC) enema 200 gm (has no administration in time range)  0.9 %  sodium chloride infusion (has no administration in time range)  mometasone-formoterol (DULERA) 200-5 MCG/ACT inhaler 2 puff (has no administration in time range)  acetaminophen (TYLENOL) tablet 650 mg (has no administration in time range)    Or  acetaminophen (TYLENOL) 160 MG/5ML solution 650 mg (has no administration in time range)    Or  acetaminophen (TYLENOL) suppository 650 mg (has no administration in time range)  albuterol (PROVENTIL) (2.5 MG/3ML) 0.083% nebulizer solution 2.5 mg (has no administration in time range)    ED Course/ Medical Decision Making/ A&P                             Medical Decision Making Amount and/or Complexity of Data Reviewed Labs: ordered.    Details: Ammonia elevated though also hemolyzed.  Chronic anemia Radiology: ordered and independent interpretation performed.    Details: No head bleed on CT.  Risk Prescription drug management. Decision regarding hospitalization.   Patient presents as a code stroke.  Has new facial droop.  Also found to be retaining his medicines from this morning and had to be suctioned out by nursing staff.  Initial vitals indicated hypoxia but these were not accurate measurements.  Otherwise he is maintaining his airway and in no distress.  Intermittently sleepy.  Will give lactulose, unclear if this is a hemolyzed  ammonia versus a true pathologic hepatic encephalopathy.  He is distended and probably needs a paracentesis but not emergently.  No fever or hypotension.  Ultimately, I think will need admission as he is unable to swallow and chart review from his most recent hospitalization indicates he was eating fine.  Will admit to  the hospital service, discussed with Dr. Alinda Money.        Final Clinical Impression(s) / ED Diagnoses Final diagnoses:  Facial droop    Rx / DC Orders ED Discharge Orders     None         Pricilla Loveless, MD 01/23/23 [phone]

## 2023-01-24 ENCOUNTER — Inpatient Hospital Stay (HOSPITAL_COMMUNITY): Payer: Medicaid Other

## 2023-01-24 ENCOUNTER — Observation Stay (HOSPITAL_COMMUNITY): Payer: Medicaid Other

## 2023-01-24 DIAGNOSIS — D696 Thrombocytopenia, unspecified: Secondary | ICD-10-CM | POA: Diagnosis not present

## 2023-01-24 DIAGNOSIS — Z7951 Long term (current) use of inhaled steroids: Secondary | ICD-10-CM | POA: Diagnosis not present

## 2023-01-24 DIAGNOSIS — F0390 Unspecified dementia without behavioral disturbance: Secondary | ICD-10-CM | POA: Diagnosis present

## 2023-01-24 DIAGNOSIS — R4701 Aphasia: Secondary | ICD-10-CM | POA: Diagnosis present

## 2023-01-24 DIAGNOSIS — R4182 Altered mental status, unspecified: Secondary | ICD-10-CM | POA: Diagnosis not present

## 2023-01-24 DIAGNOSIS — I69322 Dysarthria following cerebral infarction: Secondary | ICD-10-CM | POA: Diagnosis not present

## 2023-01-24 DIAGNOSIS — E876 Hypokalemia: Secondary | ICD-10-CM | POA: Diagnosis not present

## 2023-01-24 DIAGNOSIS — I6389 Other cerebral infarction: Secondary | ICD-10-CM

## 2023-01-24 DIAGNOSIS — R400 Somnolence: Secondary | ICD-10-CM | POA: Diagnosis not present

## 2023-01-24 DIAGNOSIS — I69354 Hemiplegia and hemiparesis following cerebral infarction affecting left non-dominant side: Secondary | ICD-10-CM | POA: Diagnosis not present

## 2023-01-24 DIAGNOSIS — R1312 Dysphagia, oropharyngeal phase: Secondary | ICD-10-CM | POA: Diagnosis not present

## 2023-01-24 DIAGNOSIS — Z79899 Other long term (current) drug therapy: Secondary | ICD-10-CM | POA: Diagnosis not present

## 2023-01-24 DIAGNOSIS — Z8673 Personal history of transient ischemic attack (TIA), and cerebral infarction without residual deficits: Secondary | ICD-10-CM | POA: Diagnosis not present

## 2023-01-24 DIAGNOSIS — I6789 Other cerebrovascular disease: Secondary | ICD-10-CM | POA: Diagnosis present

## 2023-01-24 DIAGNOSIS — R29810 Facial weakness: Secondary | ICD-10-CM | POA: Diagnosis present

## 2023-01-24 DIAGNOSIS — Z7189 Other specified counseling: Secondary | ICD-10-CM | POA: Diagnosis not present

## 2023-01-24 DIAGNOSIS — G9340 Encephalopathy, unspecified: Secondary | ICD-10-CM | POA: Diagnosis not present

## 2023-01-24 DIAGNOSIS — K7460 Unspecified cirrhosis of liver: Secondary | ICD-10-CM | POA: Diagnosis not present

## 2023-01-24 DIAGNOSIS — D6959 Other secondary thrombocytopenia: Secondary | ICD-10-CM | POA: Diagnosis present

## 2023-01-24 DIAGNOSIS — K7682 Hepatic encephalopathy: Principal | ICD-10-CM

## 2023-01-24 DIAGNOSIS — D631 Anemia in chronic kidney disease: Secondary | ICD-10-CM | POA: Diagnosis present

## 2023-01-24 DIAGNOSIS — I130 Hypertensive heart and chronic kidney disease with heart failure and stage 1 through stage 4 chronic kidney disease, or unspecified chronic kidney disease: Secondary | ICD-10-CM | POA: Diagnosis present

## 2023-01-24 DIAGNOSIS — Z66 Do not resuscitate: Secondary | ICD-10-CM | POA: Diagnosis not present

## 2023-01-24 DIAGNOSIS — E785 Hyperlipidemia, unspecified: Secondary | ICD-10-CM | POA: Diagnosis present

## 2023-01-24 DIAGNOSIS — Z515 Encounter for palliative care: Secondary | ICD-10-CM | POA: Diagnosis not present

## 2023-01-24 DIAGNOSIS — E162 Hypoglycemia, unspecified: Secondary | ICD-10-CM | POA: Diagnosis not present

## 2023-01-24 DIAGNOSIS — J449 Chronic obstructive pulmonary disease, unspecified: Secondary | ICD-10-CM | POA: Diagnosis present

## 2023-01-24 DIAGNOSIS — K7031 Alcoholic cirrhosis of liver with ascites: Secondary | ICD-10-CM | POA: Diagnosis present

## 2023-01-24 DIAGNOSIS — K922 Gastrointestinal hemorrhage, unspecified: Secondary | ICD-10-CM | POA: Diagnosis present

## 2023-01-24 DIAGNOSIS — I5032 Chronic diastolic (congestive) heart failure: Secondary | ICD-10-CM | POA: Diagnosis present

## 2023-01-24 DIAGNOSIS — N1832 Chronic kidney disease, stage 3b: Secondary | ICD-10-CM | POA: Diagnosis present

## 2023-01-24 DIAGNOSIS — R1310 Dysphagia, unspecified: Secondary | ICD-10-CM | POA: Diagnosis present

## 2023-01-24 LAB — LIPID PANEL
Cholesterol: 179 mg/dL (ref 0–200)
HDL: 32 mg/dL — ABNORMAL LOW (ref >40)
LDL Cholesterol: 133 mg/dL — ABNORMAL HIGH (ref 0–99)
Total CHOL/HDL Ratio: 5.6 RATIO
Triglycerides: 69 mg/dL (ref <150)
VLDL: 14 mg/dL (ref 0–40)

## 2023-01-24 LAB — ECHOCARDIOGRAM COMPLETE
Area-P 1/2: 2.93 cm2
S' Lateral: 3.1 cm

## 2023-01-24 MED ORDER — ENOXAPARIN SODIUM 40 MG/0.4ML IJ SOSY
40.0000 mg | PREFILLED_SYRINGE | Freq: Every day | INTRAMUSCULAR | Status: DC
  Administered 2023-01-24 – 2023-01-29 (×6): 40 mg via SUBCUTANEOUS
  Filled 2023-01-24 (×6): qty 0.4

## 2023-01-24 MED ORDER — MAGNESIUM SULFATE 2 GM/50ML IV SOLN
2.0000 g | Freq: Once | INTRAVENOUS | Status: AC
  Administered 2023-01-24: 2 g via INTRAVENOUS
  Filled 2023-01-24: qty 50

## 2023-01-24 MED ORDER — LORAZEPAM 2 MG/ML IJ SOLN: 0.5000 mg | Freq: Once | INTRAMUSCULAR | Status: DC | PRN

## 2023-01-24 MED ORDER — LORAZEPAM 2 MG/ML IJ SOLN
1.0000 mg | Freq: Once | INTRAMUSCULAR | Status: AC
  Administered 2023-01-24: 1 mg via INTRAVENOUS
  Filled 2023-01-24: qty 1

## 2023-01-24 MED ORDER — IOHEXOL 350 MG/ML SOLN
75.0000 mL | Freq: Once | INTRAVENOUS | Status: AC | PRN
  Administered 2023-01-24: 75 mL via INTRAVENOUS

## 2023-01-24 NOTE — Plan of Care (Signed)
  Problem: Education: Goal: Knowledge of disease or condition will improve Outcome: Not Progressing Goal: Knowledge of secondary prevention will improve (MUST DOCUMENT ALL) Outcome: Not Progressing Goal: Knowledge of patient specific risk factors will improve Loraine Leriche N/A or DELETE if not current risk factor) Outcome: Not Progressing   Problem: Ischemic Stroke/TIA Tissue Perfusion: Goal: Complications of ischemic stroke/TIA will be minimized Outcome: Not Progressing   Problem: Coping: Goal: Will verbalize positive feelings about self Outcome: Not Progressing Goal: Will identify appropriate support needs Outcome: Not Progressing

## 2023-01-24 NOTE — Progress Notes (Signed)
[facility] PROGRESS NOTE   Jacob Bass ZOX:[id number] DOB: [birth year] DOA: 01/23/2023  PCP: Donia Ast., MD  Brief History/Interval Summary: 62 y.o. male with medical history significant of dementia, stroke with left-sided deficits, CKD 3B, PUD, hypertension, COPD, cirrhosis, anemia, thrombocytopenia presenting as a code stroke.  He was hospitalized for further management.  Consultants: Neurology  Procedures: None yet    Subjective/Interval History: Patient noted to be confused distracted.  Not answering any questions.  He does have dementia at baseline.    Assessment/Plan:  Concern for acute stroke/history of previous stroke/history of dementia Sent from nursing facility after staff noted left-sided facial droop.  Due to his dementia history was so difficult to obtain from the patient. Previous history of subarachnoid hemorrhage.  He has left-sided weakness from previous stroke. MRI was attempted but patient was not cooperative last night.  Noted to be reattempted this morning with sedation. Neurology is consulted. CT head did not show any acute findings.  LDL 133.  Will need statin. Swallow evaluation. HbA1c is pending. CT angiogram head and neck is pending. Echocardiogram is pending. PT and OT evaluation.  Hyperammonemia Ammonia level noted to be significantly elevated at 137.  Started on lactulose enemas.  Will recheck ammonia level tomorrow.  Hypomagnesemia Unclear if this was repleted or not.  Will give additional dose today.  Recheck labs tomorrow.  History of liver cirrhosis/thrombocytopenia He does have ascites and peripheral edema.  He has had multiple paracentesis previously Has history of alcohol use and hepatitis C. LFTs were noted to be stable. Diuretics on hold currently due to n.p.o. status. Platelet counts noted to be 63,000 yesterday.  Will recheck labs tomorrow.  History of COPD Continue home medications.  Normocytic anemia No  evidence of overt bleeding.  Likely anemia of chronic disease.  Chronic kidney disease stage IIIb Renal function noted to be stable.  History of peptic ulcer disease Reinitiate PPI when able to take orally.  Essential hypertension Permissive hypertension being allowed.  DVT Prophylaxis: SCDs Code Status: Full code Family Communication: No family at bedside.  He is a ward of the state. Disposition Plan: Back to SNF when medically stable  Status is: Observation The patient will require care spanning > 2 midnights and should be moved to inpatient because: Acute encephalopathy      Medications: Scheduled:   stroke: early stages of recovery book   Does not apply Once   lactulose  300 mL Rectal BID   LORazepam  1 mg Intravenous Once   mometasone-formoterol  2 puff Inhalation BID   Continuous:  sodium chloride     WJX:BJYNWG chloride, acetaminophen **OR** acetaminophen (TYLENOL) oral liquid 160 mg/5 mL **OR** acetaminophen, albuterol, LORazepam  Antibiotics: Anti-infectives (From admission, onward)    None       Objective:  Vital Signs  Vitals:   01/23/23 2006 01/23/23 2352 01/24/23 0358 01/24/23 0804  BP: (!) 161/82 (!) 118/101 136/78 (!) 157/85  Pulse: (!) 101 61 (!) 106 98  Resp: 16 16 16 18   Temp: 98.2 F (36.8 C) 98 F (36.7 C) 98 F (36.7 C) 98.2 F (36.8 C)  TempSrc: Oral   Oral  SpO2: 99% 99% 98% 100%   No intake or output data in the 24 hours ending 01/24/23 0923 There were no vitals filed for this visit.  General appearance: Awake alert.  Directed.  Confused.  Not very cooperative. Resp: Clear to auscultation bilaterally.  Normal effort Cardio: S1-S2 is normal regular.  No S3-S4.  No rubs murmurs or bruit GI: Abdomen is noted to be distended.  Nontender.  No masses organomegaly appreciated. Extremities: Lower extremity edema noted bilaterally. Neurologic: Left-sided weakness from previous stroke noted.   Lab Results:  Data Reviewed: I have  personally reviewed following labs and reports of the imaging studies  CBC: Recent Labs  Lab 01/23/23 1135 01/23/23 1141  WBC 5.3  --   NEUTROABS 2.9  --   HGB 11.2* 11.6*  HCT 32.6* 34.0*  MCV 94.5  --   PLT 63*  --     Basic Metabolic Panel: Recent Labs  Lab 01/23/23 1135 01/23/23 1141 01/23/23 2040  NA 136 141  --   K 4.1 4.2  --   CL 103 102  --   CO2 25  --   --   GLUCOSE 90 86  --   BUN 20 22  --   CREATININE 1.19 1.00  --   CALCIUM 8.2*  --   --   MG  --   --  1.6*    GFR: Estimated Creatinine Clearance: 69.4 mL/min (by C-G formula based on SCr of 1 mg/dL).  Liver Function Tests: Recent Labs  Lab 01/23/23 1135  AST 66*  ALT 38  ALKPHOS 75  BILITOT 2.0*  PROT 7.0  ALBUMIN 1.7*    Recent Labs  Lab 01/23/23 1230  AMMONIA 137*    Coagulation Profile: Recent Labs  Lab 01/23/23 2040  INR 1.4*     CBG: Recent Labs  Lab 01/23/23 1136  GLUCAP 91    Lipid Profile: Recent Labs    01/24/23 0434  CHOL 179  HDL 32*  LDLCALC 133*  TRIG 69  CHOLHDL 5.6     Radiology Studies: DG Abdomen 1 View  Result Date: 01/23/2023 CLINICAL DATA:  Evaluate for foreign bodies prior to MRI. EXAM: ABDOMEN - 1 VIEW COMPARISON:  September 14, 2022. FINDINGS: The bowel gas pattern is normal. No radio-opaque calculi or other significant radiographic abnormality are seen. IMPRESSION: No radiopaque foreign body is noted.  No abnormal bowel dilatation. Aortic Atherosclerosis (ICD10-I70.0). Electronically Signed   By: Lupita Raider M.D.   On: 01/23/2023 14:28   DG Chest Portable 1 View  Result Date: 01/23/2023 CLINICAL DATA:  Altered mental status EXAM: PORTABLE CHEST 1 VIEW COMPARISON:  01/11/2023 FINDINGS: Heart size is normal. Aortic atherosclerosis as seen previously. Right lung is clear. Question mild atelectasis or infiltrate at the left lung base. Patient's left arm overlies that region to some degree. IMPRESSION: Question mild atelectasis or infiltrate at  the left lung base. Electronically Signed   By: Paulina Fusi M.D.   On: 01/23/2023 12:41   CT HEAD CODE STROKE WO CONTRAST  Result Date: 01/23/2023 CLINICAL DATA:  Code stroke.  Left-sided facial droop EXAM: CT HEAD WITHOUT CONTRAST TECHNIQUE: Contiguous axial images were obtained from the base of the skull through the vertex without intravenous contrast. RADIATION DOSE REDUCTION: This exam was performed according to the departmental dose-optimization program which includes automated exposure control, adjustment of the mA and/or kV according to patient size and/or use of iterative reconstruction technique. COMPARISON:  MRI Brain 09/10/22 FINDINGS: Brain: No evidence of acute infarction, hemorrhage, hydrocephalus, extra-axial collection or mass lesion/mass effect. There is sequela of severe chronic microvascular ischemic change with a extensive periventricular hypodensity, as seen on prior brain MRI dated 09/10/2022. There are chronic infarcts in the right corona radiata and centrum semiovale. Vascular: No hyperdense vessel or unexpected calcification. Skull: Normal. Negative for  fracture or focal lesion. Sinuses/Orbits: No middle ear or mastoid effusion. Paranasal sinuses are clear. Orbits are unremarkable. Other: None. ASPECTS [facility] Stroke Program Early CT Score): 10 when accounting for chronic findings. IMPRESSION: 1. No hemorrhage or CT evidence of an acute cortical infarct. 2. Sequela of severe chronic microvascular ischemic change with chronic infarcts in the right corona radiata and centrum semiovale. Findings were discussed with Dr. Selina Cooley on 01/23/23 at 12:13 PM. Electronically Signed   By: Lorenza Cambridge M.D.   On: 01/23/2023 12:16       LOS: 0 days   Elliyah Liszewski Rito Ehrlich  [facility] Pager on [email or web address]  01/24/2023, 9:23 AM

## 2023-01-24 NOTE — Progress Notes (Addendum)
STROKE TEAM PROGRESS NOTE   INTERVAL HISTORY No family is at the bedside.   Neurologic exam is stable with persistent left mouth droop, chronic left spastic hemiparesis with contractures, spontaneous and purposeful movements on the right.   CT head without hemorrhage or evidence of acute cortical infarct.  Sequela of severe chronic microvascular ischemic change with chronic infarct in the right corona radiata and centrum semiovale.  MRI brain shows no acute infarct.  Severe changes of chronic microvascular disease and multiple chronic infarcts with generalized volume loss.   Echocardiogram ejection fraction 55 to 60%.  Grade 1 diastolic dysfunction.  Left atrium mildly dilated.  Previously followed by [facility] and April 2024 patient not on prophylaxis with aspirin due to GIB, unable to take cholesterol medication due to cirrhosis and elevated LFTs..  During previous stroke evaluation in 2020, patient was discharged on aspirin monotherapy due to thrombocytopenia.  Patient not on any stroke prophylaxis outpatient.  Vitals:   01/23/23 2006 01/23/23 2352 01/24/23 0358 01/24/23 0804  BP: (!) 161/82 (!) 118/101 136/78 (!) 157/85  Pulse: (!) 101 61 (!) 106 98  Resp: 16 16 16 18   Temp: 98.2 F (36.8 C) 98 F (36.7 C) 98 F (36.7 C) 98.2 F (36.8 C)  TempSrc: Oral   Oral  SpO2: 99% 99% 98% 100%   CBC:  Recent Labs  Lab 01/23/23 1135 01/23/23 1141  WBC 5.3  --   NEUTROABS 2.9  --   HGB 11.2* 11.6*  HCT 32.6* 34.0*  MCV 94.5  --   PLT 63*  --    Basic Metabolic Panel:  Recent Labs  Lab 01/23/23 1135 01/23/23 1141 01/23/23 2040  NA 136 141  --   K 4.1 4.2  --   CL 103 102  --   CO2 25  --   --   GLUCOSE 90 86  --   BUN 20 22  --   CREATININE 1.19 1.00  --   CALCIUM 8.2*  --   --   MG  --   --  1.6*   Lipid Panel:  Recent Labs  Lab 01/24/23 0434  CHOL 179  TRIG 69  HDL 32*  CHOLHDL 5.6  VLDL 14  LDLCALC 161*   HgbA1c: No results for input(s): "HGBA1C" in the last 168  hours. Urine Drug Screen: No results for input(s): "LABOPIA", "COCAINSCRNUR", "LABBENZ", "AMPHETMU", "THCU", "LABBARB" in the last 168 hours.  Alcohol Level  Recent Labs  Lab 01/23/23 2040  ETH <10   IMAGING past 24 hours DG Abdomen 1 View  Result Date: 01/23/2023 CLINICAL DATA:  Evaluate for foreign bodies prior to MRI. EXAM: ABDOMEN - 1 VIEW COMPARISON:  September 14, 2022. FINDINGS: The bowel gas pattern is normal. No radio-opaque calculi or other significant radiographic abnormality are seen. IMPRESSION: No radiopaque foreign body is noted.  No abnormal bowel dilatation. Aortic Atherosclerosis (ICD10-I70.0). Electronically Signed   By: Lupita Raider M.D.   On: 01/23/2023 14:28   DG Chest Portable 1 View  Result Date: 01/23/2023 CLINICAL DATA:  Altered mental status EXAM: PORTABLE CHEST 1 VIEW COMPARISON:  01/11/2023 FINDINGS: Heart size is normal. Aortic atherosclerosis as seen previously. Right lung is clear. Question mild atelectasis or infiltrate at the left lung base. Patient's left arm overlies that region to some degree. IMPRESSION: Question mild atelectasis or infiltrate at the left lung base. Electronically Signed   By: Paulina Fusi M.D.   On: 01/23/2023 12:41   CT HEAD CODE STROKE WO  CONTRAST  Result Date: 01/23/2023 CLINICAL DATA:  Code stroke.  Left-sided facial droop EXAM: CT HEAD WITHOUT CONTRAST TECHNIQUE: Contiguous axial images were obtained from the base of the skull through the vertex without intravenous contrast. RADIATION DOSE REDUCTION: This exam was performed according to the departmental dose-optimization program which includes automated exposure control, adjustment of the mA and/or kV according to patient size and/or use of iterative reconstruction technique. COMPARISON:  MRI Brain 09/10/22 FINDINGS: Brain: No evidence of acute infarction, hemorrhage, hydrocephalus, extra-axial collection or mass lesion/mass effect. There is sequela of severe chronic microvascular  ischemic change with a extensive periventricular hypodensity, as seen on prior brain MRI dated 09/10/2022. There are chronic infarcts in the right corona radiata and centrum semiovale. Vascular: No hyperdense vessel or unexpected calcification. Skull: Normal. Negative for fracture or focal lesion. Sinuses/Orbits: No middle ear or mastoid effusion. Paranasal sinuses are clear. Orbits are unremarkable. Other: None. ASPECTS [facility] Stroke Program Early CT Score): 10 when accounting for chronic findings. IMPRESSION: 1. No hemorrhage or CT evidence of an acute cortical infarct. 2. Sequela of severe chronic microvascular ischemic change with chronic infarcts in the right corona radiata and centrum semiovale. Findings were discussed with Dr. Selina Cooley on 01/23/23 at 12:13 PM. Electronically Signed   By: Lorenza Cambridge M.D.   On: 01/23/2023 12:16    PHYSICAL EXAM Constitutional: Cachectic appearing African-American male laying in hospital bed Psych: Unable to assess due to lack of patient interaction with examiner Eyes: No scleral injection HENT: No OP obstrucion MSK: Left hemiparesis with contractures Cardiovascular: Normal rate and regular rhythm.  Respiratory: Effort normal, non-labored breathing on room air GI: Soft.  No distension. There is no tenderness.  Skin: WDI  Neuro: Mental Status: Patient is nonverbal, does not follow commands He is awake and alert, tracks examiner. Cranial Nerves: II: PERRL III,IV, VI: Tracks examiner throughout visual fields VII: Left facial droop present VIII: Hearing is intact to voice XI: Head is grossly midline, turns left and right XII: Protrude tongue Motor: Increased tone on the left.  Bulk is mildly decreased for age. Left spastic hemiparesis with chronic contractures. Right upper and lower extremity moves spontaneously, antigravity, and purposefully. Sensory: Next to light touch throughout, will withdrawal to noxious stimuli on the left upper and lower  extremity. Cerebellar: Patient does not perform  ASSESSMENT/PLAN Mr. Damacio Lafevers is a 62 y.o. male with history of CVAs in 2020 and 2024, alcoholic liver cirrhosis, HCV, history of SBP, portal gastropathy, thrombocytopenia, pleural effusion, recurrent ascites, stroke with residual left-sided weakness, dementia, anemia, diverticular GI bleed, CKD stage 2-3a, COPD, HTN presenting with left facial droop. Patient was previously prescribed aspirin 81 mg po daily, however, after his last stroke, patient was not placed on DAPT due to thrombocytopenia and recently reported discontinuation of aspirin due to GI bleed.  Stroke-like symptoms with new onset left mouth droop in the setting of chronic small vessel disease CT head code stroke no hemorrhage or CT evidence of acute cortical infarct.  Sequela of severe chronic microvascular ischemic change with chronic infarct in the right corona radiata and centrum semiovale. CTA head & neck pending MRI brain without contrast pending 2D Echo LVEF 55-60% left atrial size was mildly dilated. LDL 133 HgbA1c 4.5 VTE prophylaxis - Lovenox subcu    Diet   Diet NPO time specified   aspirin 81 mg daily prior to admission, now on aspirin 81 mg daily. No DAPT in patient with thrombocytopenia and history of GI bleed.  Therapy recommendations:  pending  Disposition: Pending  History of right corona radiata and centrum semiovale infarcts in 2020 Started on aspirin monotherapy, unable to tolerate DAPT in the setting of thrombocytopenia Stopped ASA prophylaxis due to GIB Chronic left-sided weakness with left spastic hemiparesis and contractures, dysarthria Unable to tolerate cholesterol medication due to elevated LFTs per most recent follow-up in April 2024  Hypertension Home meds: Furosemide, spironolactone Stable Permissive hypertension (OK if < 220/120) but gradually normalize in 5-7 days Long-term BP goal normotensive  Hyperlipidemia Home meds: None due to  cirrhosis and elevated LFTs LDL 133, goal < 70 Add atorvastatin 40 mg p.o. daily as LFTs tolerate Continue statin at discharge  Other Stroke Risk Factors Advanced Age >/= 13  Former cigarette smoker Hx stroke/TIA  Other Active Problems Thrombocytopenia Platelets 90 > 63 Recurrent ascites, hyperammonemia Mildly elevated LFTs Ammonia 137 On lactulose Dementia  Hospital day # 0  Lanae Boast, AGACNP-BC [facility] Pager: [phone]  STROKE MD NOTE :  I have personally obtained history,examined this patient, reviewed notes, independently viewed imaging studies, participated in medical decision making and plan of care.ROS completed by me personally and pertinent positives fully documented  I have made any additions or clarifications directly to the above note. Agree with note above.  Patient with prior history of multiple strokes with spastic hemiplegia was bedridden with poor baseline functioning was brought in for facial droop.  MRI is negative for acute stroke.  Continue aspirin for stroke prevention.  Transferred back to skilled nursing facility.  No further stroke workup is necessary.  Discussed with Dr. Rito Ehrlich.  Greater than 50% time during this 50-minute visit was spent in counseling and coordination of care about his multiple strokes and strokelike presentation discussion with care team and answering questions  Delia Heady, MD Medical Director [facility] Pager: [phone] 01/24/2023 3:59 PM  To contact Stroke Continuity provider, please refer to [email or web address]. After hours, contact General Neurology

## 2023-01-24 NOTE — Evaluation (Signed)
Occupational Therapy Evaluation/Discharge Patient Details Name: Jacob Bass MRN: [id number] DOB: Nov 20, 1960 Today's Date: 01/24/2023   History of Present Illness 62 y.o. male presents to [facility] hospital from SNF with L facial droop. MRI brain pending. PMH: dementia, CVA with L deficits, CKD, HTN, COPD, cirrhosis, anemia, thrombocytopenia.   Clinical Impression   PTA, pt from LTC facility, wheelchair/bedbound and requires extensive assist for all ADLs. Pt known to this OT from prior admissions, presenting with significantly increased L UE contracture w/ minimal tolerance to PROM of elbow/shoulder. Pt with inconsistent command following, near constant movement of RUE (pushing/reaching to bedrail) and requires extensive +2 assist for bed mobility and all ADLs bed level. Due to pt cognition and limited therapy progress with prior admissions, feel no need for acute OT at this time. Will sign off.      Recommendations for follow up therapy are one component of a multi-disciplinary discharge planning process, led by the attending physician.  Recommendations may be updated based on patient status, additional functional criteria and insurance authorization.   Assistance Recommended at Discharge Frequent or constant Supervision/Assistance  Patient can return home with the following Two people to help with walking and/or transfers;Two people to help with bathing/dressing/bathroom;Assistance with feeding    Functional Status Assessment  Patient has not had a recent decline in their functional status  Equipment Recommendations  None recommended by OT    Recommendations for Other Services Other (comment) (palliative)     Precautions / Restrictions Precautions Precautions: Fall Precaution Comments: left hemiparesis with increased tone in the LUE Restrictions Weight Bearing Restrictions: No      Mobility Bed Mobility Overal bed mobility: Needs Assistance Bed Mobility: Supine to Sit, Sit to Supine,  Rolling Rolling: Max assist, Min assist   Supine to sit: Total assist, +2 for physical assistance, +2 for safety/equipment, HOB elevated Sit to supine: Max assist, +2 for physical assistance, +2 for safety/equipment   General bed mobility comments: Total A x 2 to get EOB d/t inconsistent command following. Pt fatigued with increased lean towards HOB w/ Max A x 2 to return to supine. pt able to roll to L side using R UE On bedrail with Min A and increased assist needed to roll to R side due inability to use L UE to assist.    Transfers                   General transfer comment: deferred      Balance Overall balance assessment: Needs assistance Sitting-balance support: Feet supported, Single extremity supported, No upper extremity supported Sitting balance-Leahy Scale: Poor Sitting balance - Comments: overall restless movements with RUE reaching towards bedrail consistently - would not attend to directions to place hand on knee, therapist's knee, etc. able to sit briefly without support but up to Mod A needed due to fatigue and R lateral lean Postural control: Right lateral lean, Posterior lean                                 ADL either performed or assessed with clinical judgement   ADL Overall ADL's : Needs assistance/impaired;At baseline                                       General ADL Comments: Pt appears at baseline for ADLs, inconsistent command following, Total A for  all bathing, dressing and toileting bed level. In prior admission, pt able to assist with feeding and grooming though inconsistent.     Vision Ability to See in Adequate Light: 0 Adequate Patient Visual Report: No change from baseline;Other (comment) (difficult to assess due to cognition but appears [facility]) Vision Assessment?: No apparent visual deficits     Perception     Praxis      Pertinent Vitals/Pain Pain Assessment Pain Assessment: Faces Faces Pain Scale: Hurts  little more Pain Location: L UE with ROM attempts at elbow Pain Descriptors / Indicators: Grimacing Pain Intervention(s): Monitored during session, Limited activity within patient's tolerance     Hand Dominance Right   Extremity/Trunk Assessment Upper Extremity Assessment Upper Extremity Assessment: RUE deficits/detail;LUE deficits/detail RUE Deficits / Details: AROM WFL, constant movement of this UE reaching for bedrail, pushing against bedrails LUE Deficits / Details: Significantly increased tone from prior admissions, flexion contracture at elbow with ability to stretch 20*movement to 90* position, very litte passive ROM of shoulder with subluxation noted w/ anterior shift. fingers in extension and some passive movement in wrist. no appreciable AROM LUE Coordination: decreased fine motor;decreased gross motor   Lower Extremity Assessment Lower Extremity Assessment: Defer to PT evaluation   Cervical / Trunk Assessment Cervical / Trunk Assessment: Kyphotic   Communication Communication Communication: Expressive difficulties;Other (comment) (dysarthria)   Cognition Arousal/Alertness: Awake/alert Behavior During Therapy: Flat affect, Restless Overall Cognitive Status: History of cognitive impairments - at baseline                                 General Comments: Pt likely at baseline for cognition, inconsistent following of commands, constant movement of RUE (reaching for bedrail, pushing against bedrail)     General Comments       Exercises     Shoulder Instructions      Home Living Family/patient expects to be discharged to:: Skilled nursing facility                                 Additional Comments: From [facility]      Prior Functioning/Environment Prior Level of Function : Patient poor historian/Family not available             Mobility Comments: Per older notes, pt was total assist for mobility via wheelchair; likely using  lift for transfers vs Total A pivots ADLs Comments: Per previous notes, he was total A for all basic ADLs with min to max assist for self feeding.        OT Problem List:        OT Treatment/Interventions: Self-care/ADL training;Patient/family education;Balance training;Neuromuscular education;Therapeutic activities;DME and/or AE instruction;Cognitive remediation/compensation;Therapeutic exercise;Manual therapy;Splinting    OT Goals(Current goals can be found in the care plan section) Acute Rehab OT Goals Patient Stated Goal: none stated OT Goal Formulation: With patient  OT Frequency:      Co-evaluation PT/OT/SLP Co-Evaluation/Treatment: Yes Reason for Co-Treatment: Necessary to address cognition/behavior during functional activity;For patient/therapist safety;To address functional/ADL transfers   OT goals addressed during session: ADL's and self-care;Strengthening/ROM      AM-PAC OT "6 Clicks" Daily Activity     Outcome Measure Help from another person eating meals?: A Lot Help from another person taking care of personal grooming?: A Lot Help from another person toileting, which includes using toliet, bedpan, or urinal?: Total Help from another person  bathing (including washing, rinsing, drying)?: Total Help from another person to put on and taking off regular upper body clothing?: Total Help from another person to put on and taking off regular lower body clothing?: Total 6 Click Score: 8   End of Session Nurse Communication: Mobility status  Activity Tolerance: Patient limited by fatigue Patient left: in bed;with call bell/phone within reach;with bed alarm set;Other (comment) (mitts on R UE)  OT Visit Diagnosis: Muscle weakness (generalized) (M62.81);Other symptoms and signs involving cognitive function Hemiplegia - Right/Left: Left Hemiplegia - dominant/non-dominant: Non-Dominant Hemiplegia - caused by: Cerebral infarction                Time: 1610-9604 OT Time  Calculation (min): 21 min Charges:  OT General Charges $OT Visit: 1 Visit OT Evaluation $OT Eval Moderate Complexity: 1 Mod  Bradd Canary, OTR/L Acute Rehab Services Office: [phone]   Lorre Munroe 01/24/2023, 9:54 AM

## 2023-01-24 NOTE — Evaluation (Signed)
Physical Therapy Evaluation and Discharge Patient Details Name: Jacob Bass MRN: [id number] DOB: 1961-07-05 Today's Date: 01/24/2023  History of Present Illness  62 y.o. male presents to [facility] hospital from SNF with L facial droop. MRI brain pending. PMH: dementia, CVA with L deficits, CKD, HTN, COPD, cirrhosis, anemia, thrombocytopenia.  Clinical Impression  Patient evaluated by Physical Therapy with no further acute PT needs identified. All education has been completed and the patient has no further questions. Pt bed bound at LTC institution and has not had significant change in function this admission. Given lack of progress with function and cognition previous admissions pt not appropriate for acute pt at this time. See below for any follow-up Physical Therapy or equipment needs. PT is signing off. Thank you for this referral.        Recommendations for follow up therapy are one component of a multi-disciplinary discharge planning process, led by the attending physician.  Recommendations may be updated based on patient status, additional functional criteria and insurance authorization.  Follow Up Recommendations Can patient physically be transported by private vehicle: No     Assistance Recommended at Discharge Frequent or constant Supervision/Assistance  Patient can return home with the following  Two people to help with walking and/or transfers;A lot of help with bathing/dressing/bathroom;Assistance with cooking/housework;Direct supervision/assist for medications management;Assistance with feeding;Direct supervision/assist for financial management;Assist for transportation;Help with stairs or ramp for entrance    Equipment Recommendations None recommended by PT  Recommendations for Other Services       Functional Status Assessment Patient has not had a recent decline in their functional status     Precautions / Restrictions Precautions Precautions: Fall Precaution Comments: left  hemiparesis with increased tone in the LUE Restrictions Weight Bearing Restrictions: No      Mobility  Bed Mobility Overal bed mobility: Needs Assistance Bed Mobility: Supine to Sit, Sit to Supine, Rolling Rolling: Max assist, Min assist Sidelying to sit: Max assist Supine to sit: Total assist, +2 for physical assistance, +2 for safety/equipment, HOB elevated Sit to supine: Max assist, +2 for physical assistance, +2 for safety/equipment   General bed mobility comments: Total A x 2 to get EOB d/t inconsistent command following. Pt fatigued with increased lean towards HOB w/ Max A x 2 to return to supine. pt able to roll to L side using R UE On bedrail with Min A and increased assist needed to roll to R side due inability to use L UE to assist.    Transfers                   General transfer comment: attempted to scoot along EOB but pt not participating with this, not following commands    Ambulation/Gait               General Gait Details: unable at baseline  Stairs            Wheelchair Mobility    Modified Rankin (Stroke Patients Only)       Balance Overall balance assessment: Needs assistance Sitting-balance support: Feet supported, Single extremity supported, No upper extremity supported Sitting balance-Leahy Scale: Poor Sitting balance - Comments: overall restless movements with RUE reaching towards bedrail consistently - would not attend to directions to place hand on knee, therapist's knee, etc. able to sit briefly without support but up to Mod A needed due to fatigue and R lateral lean Postural control: Right lateral lean, Posterior lean   Standing balance-Leahy Scale: Zero Standing balance comment:  deferred                             Pertinent Vitals/Pain Pain Assessment Pain Assessment: Faces Faces Pain Scale: Hurts little more Breathing: normal Negative Vocalization: none Facial Expression: smiling or inexpressive Body  Language: relaxed Consolability: no need to console PAINAD Score: 0 Pain Location: L UE with ROM attempts at elbow Pain Descriptors / Indicators: Grimacing Pain Intervention(s): Limited activity within patient's tolerance, Monitored during session, Repositioned    Home Living Family/patient expects to be discharged to:: Skilled nursing facility   Available Help at Discharge: Skilled Nursing Facility Type of Home: Skilled Nursing Facility             Additional Comments: From [employer]    Prior Function Prior Level of Function : Patient poor historian/Family not available             Mobility Comments: Per older notes, pt was total assist for mobility via wheelchair; likely using lift for transfers vs Total A pivots. Pt stated that he has not been getting in w/c recently ADLs Comments: Per previous notes, he was total A for all basic ADLs with min to max assist for self feeding.     Hand Dominance   Dominant Hand: Right    Extremity/Trunk Assessment   Upper Extremity Assessment Upper Extremity Assessment: Defer to OT evaluation RUE Deficits / Details: AROM WFL, constant movement of this UE reaching for bedrail, pushing against bedrails LUE Deficits / Details: Significantly increased tone from prior admissions, flexion contracture at elbow with ability to stretch 20*movement to 90* position, very litte passive ROM of shoulder with subluxation noted w/ anterior shift. fingers in extension and some passive movement in wrist. no appreciable AROM LUE Sensation:  (unable to determine) LUE Coordination: decreased fine motor;decreased gross motor    Lower Extremity Assessment Lower Extremity Assessment: RLE deficits/detail;LLE deficits/detail;Generalized weakness RLE Deficits / Details: B knee contractures, not using LE's to push though floor when trying to scoot, holding feet off floor with initial sitting RLE Sensation: decreased proprioception RLE Coordination: decreased  gross motor;decreased fine motor LLE Deficits / Details: weaker than RLE LLE Sensation: decreased proprioception LLE Coordination: decreased fine motor;decreased gross motor    Cervical / Trunk Assessment Cervical / Trunk Assessment: Kyphotic  Communication   Communication: Expressive difficulties;Other (comment) (dysarthria)  Cognition Arousal/Alertness: Awake/alert Behavior During Therapy: Flat affect, Restless Overall Cognitive Status: History of cognitive impairments - at baseline                                 General Comments: Pt likely at baseline for cognition, inconsistent following of commands, constant movement of RUE (reaching for bedrail, pushing against bedrail)        General Comments      Exercises     Assessment/Plan    PT Assessment Patient does not need any further PT services (LTC)  PT Problem List Decreased strength;Decreased activity tolerance;Decreased balance;Decreased coordination;Decreased mobility;Decreased cognition;Pain       PT Treatment Interventions DME instruction;Functional mobility training;Therapeutic activities;Therapeutic exercise;Balance training;Neuromuscular re-education;Cognitive remediation;Patient/family education;Wheelchair mobility training    PT Goals (Current goals can be found in the Care Plan section)  Acute Rehab PT Goals Patient Stated Goal: did not state    Frequency       Bass-evaluation PT/OT/SLP Bass-Evaluation/Treatment: Yes Reason for Bass-Treatment: Necessary to address cognition/behavior during functional activity;For  patient/therapist safety;To address functional/ADL transfers PT goals addressed during session: Mobility/safety with mobility;Balance;Strengthening/ROM OT goals addressed during session: ADL's and self-care;Strengthening/ROM       AM-PAC PT "6 Clicks" Mobility  Outcome Measure Help needed turning from your back to your side while in a flat bed without using bedrails?: A Lot Help  needed moving from lying on your back to sitting on the side of a flat bed without using bedrails?: A Lot Help needed moving to and from a bed to a chair (including a wheelchair)?: Total Help needed standing up from a chair using your arms (e.g., wheelchair or bedside chair)?: Total Help needed to walk in hospital room?: Total Help needed climbing 3-5 steps with a railing? : Total 6 Click Score: 8    End of Session   Activity Tolerance: Patient tolerated treatment well Patient left: in bed;with call bell/phone within reach;with bed alarm set Nurse Communication: Mobility status PT Visit Diagnosis: Muscle weakness (generalized) (M62.81);Pain Pain - Right/Left: Left Pain - part of body: Arm (abdomen)    Time: 1610-9604 PT Time Calculation (min) (ACUTE ONLY): 21 min   Charges:   PT Evaluation $PT Eval Moderate Complexity: 1 Mod          Jacob Bass, PT  Acute Rehab Services Secure chat preferred Office [phone]   Jacob Bass 01/24/2023, 10:15 AM

## 2023-01-24 NOTE — Significant Event (Signed)
Patient sleeping/snoring after getting 1mg  ativan for premedication for MRI. Patient left unit at 1256 pm.

## 2023-01-24 NOTE — TOC Initial Note (Signed)
Transition of Care [facility] : [facility]) - Initial/Assessment Note    Patient Details  Name: Jacob Bass MRN: [id number] Date of Birth: 1960-12-06  Transition of Care [facility]) CM/SW Contact:    Baldemar Lenis, LCSW Phone Number: 01/24/2023, 10:37 AM  Clinical Narrative:        Patient from [facility] LTC, has legal guardian with DSS, [name]. CSW to follow to return to LTC when stable.           Expected Discharge Plan: Skilled Nursing Facility Barriers to Discharge: Continued Medical Work up   Patient Goals and CMS Choice Patient states their goals for this hospitalization and ongoing recovery are:: patient unable to participate in goal setting, not oriented CMS Medicare.gov Compare Post Acute Care list provided to:: Patient Represenative (must comment) Choice offered to / list presented to : [facility] POA / Guardian [place] ownership interest in [facility].provided to:: [facility] POA / Guardian    Expected Discharge Plan and Services     Post Acute Care Choice: Skilled Nursing Facility Living arrangements for the past 2 months: Skilled Nursing Facility                                      Prior Living Arrangements/Services Living arrangements for the past 2 months: Skilled Nursing Facility Lives with:: Facility Resident Patient language and need for interpreter reviewed:: No Do you feel safe going back to the place where you live?: Yes      Need for Family Participation in Patient Care: Yes (Comment) Care giver support system in place?: Yes (comment)   Criminal Activity/Legal Involvement Pertinent to Current Situation/Hospitalization: No - Comment as needed  Activities of Daily Living      Permission Sought/Granted Permission sought to share information with : Facility Medical sales representative, Family Supports Permission granted to share information with : Yes, Verbal Permission Granted  Share Information with NAME: Joni Reining  Permission granted to share info w  AGENCY: [facility]  Permission granted to share info w Relationship: Guardian     Emotional Assessment   Attitude/Demeanor/Rapport: Unable to Assess Affect (typically observed): Unable to Assess   Alcohol / Substance Use: Not Applicable Psych Involvement: No (comment)  Admission diagnosis:  Facial droop [R29.810] Acute CVA (cerebrovascular accident) [facility]) [I63.9] Patient Active Problem List   Diagnosis Date Noted   Acute CVA (cerebrovascular accident) (HCC) 01/23/2023   Ascites 01/11/2023   Gastrointestinal hemorrhage 10/29/2022   Bright red blood per rectum 10/28/2022   Stage 3b chronic kidney disease (CKD) (HCC) 10/28/2022   Heme positive stool 09/27/2022   Malnutrition of moderate degree 09/14/2022   History of CVA (cerebrovascular accident) 09/10/2022   COPD (chronic obstructive pulmonary disease) (HCC) 09/10/2022   Anemia 09/10/2022   PUD (peptic ulcer disease) 09/10/2022   Hypernatremia 07/06/2022   Encephalopathy 07/01/2022   Altered mental status 06/30/2022   Bilateral lower extremity edema 06/30/2022   Poor fluid intake 06/30/2022   Hypoglycemia    Metabolic encephalopathy    Goals of care, counseling/discussion    Failure to thrive in adult 04/12/2022   Hypokalemia    Generalized weakness    Hypomagnesemia    COPD (chronic obstructive pulmonary disease) (HCC)    Dysphagia    Fall    Gastric ulcer    Cerebral thrombosis with cerebral infarction 10/13/2018   Cerebral embolism with cerebral infarction 10/13/2018   Subarachnoid hemorrhage 10/13/2018   Intracerebral hemorrhage  10/13/2018   Heme positive stool    Cirrhosis of liver without ascites (HCC)    Chest pain 10/09/2018   Elevated troponin 10/09/2018   EKG abnormalities 10/09/2018   Homelessness 10/09/2018   Hypertension 10/09/2018   Anemia, macrocytic 10/09/2018   Thrombocytopenia (HCC) 10/09/2018   PCP:  Donia Ast., MD Pharmacy:   [facility] - [place], [place] - [address] [place] [address] Phone: [phone] Fax: [phone]  [facility] [address] [place] [address] Phone: [phone] Fax: [phone]     Social Determinants of Health (SDOH) Social History: SDOH Screenings   Food Insecurity: No Food Insecurity (01/11/2023)  Transportation Needs: Unmet Transportation Needs (01/11/2023)  Utilities: Patient Declined (10/29/2022)  Tobacco Use: Medium Risk (01/23/2023)   SDOH Interventions:     Readmission Risk Interventions    10/30/2022    9:16 AM  Readmission Risk Prevention Plan  Transportation Screening Complete  Medication Review (RN Care Manager) Complete  PCP or Specialist appointment within 3-5 days of discharge Complete  HRI or Home Care Consult Complete  SW Recovery Care/Counseling Consult Complete  Palliative Care Screening Not Applicable  Skilled Nursing Facility Complete

## 2023-01-25 DIAGNOSIS — E162 Hypoglycemia, unspecified: Secondary | ICD-10-CM

## 2023-01-25 DIAGNOSIS — R4182 Altered mental status, unspecified: Secondary | ICD-10-CM

## 2023-01-25 DIAGNOSIS — N1832 Chronic kidney disease, stage 3b: Secondary | ICD-10-CM | POA: Diagnosis not present

## 2023-01-25 DIAGNOSIS — Z515 Encounter for palliative care: Secondary | ICD-10-CM

## 2023-01-25 DIAGNOSIS — R29810 Facial weakness: Secondary | ICD-10-CM | POA: Diagnosis not present

## 2023-01-25 DIAGNOSIS — K7460 Unspecified cirrhosis of liver: Secondary | ICD-10-CM | POA: Diagnosis not present

## 2023-01-25 DIAGNOSIS — K7682 Hepatic encephalopathy: Secondary | ICD-10-CM | POA: Diagnosis not present

## 2023-01-25 DIAGNOSIS — Z8673 Personal history of transient ischemic attack (TIA), and cerebral infarction without residual deficits: Secondary | ICD-10-CM | POA: Diagnosis not present

## 2023-01-25 LAB — COMPREHENSIVE METABOLIC PANEL
ALT: 47 U/L — ABNORMAL HIGH (ref 0–44)
AST: 81 U/L — ABNORMAL HIGH (ref 15–41)
Albumin: 1.7 g/dL — ABNORMAL LOW (ref 3.5–5.0)
Alkaline Phosphatase: 67 U/L (ref 38–126)
Anion gap: 9 (ref 5–15)
BUN: 19 mg/dL (ref 8–23)
CO2: 22 mmol/L (ref 22–32)
Calcium: 8.1 mg/dL — ABNORMAL LOW (ref 8.9–10.3)
Chloride: 106 mmol/L (ref 98–111)
Creatinine, Ser: 1.09 mg/dL (ref 0.61–1.24)
GFR, Estimated: >60 mL/min (ref >60)
Glucose, Bld: 52 mg/dL — ABNORMAL LOW (ref 70–99)
Potassium: 3.9 mmol/L (ref 3.5–5.1)
Sodium: 137 mmol/L (ref 135–145)
Total Bilirubin: 3.1 mg/dL — ABNORMAL HIGH (ref 0.3–1.2)
Total Protein: 6.8 g/dL (ref 6.5–8.1)

## 2023-01-25 LAB — HEMOGLOBIN A1C
Hgb A1c MFr Bld: 4.8 % (ref 4.8–5.6)
Mean Plasma Glucose: 91 mg/dL

## 2023-01-25 LAB — CBC
HCT: 29.4 % — ABNORMAL LOW (ref 39.0–52.0)
Hemoglobin: 10.1 g/dL — ABNORMAL LOW (ref 13.0–17.0)
MCH: 33 pg (ref 26.0–34.0)
MCHC: 34.4 g/dL (ref 30.0–36.0)
MCV: 96.1 fL (ref 80.0–100.0)
Platelets: 84 10*3/uL — ABNORMAL LOW (ref 150–400)
RBC: 3.06 MIL/uL — ABNORMAL LOW (ref 4.22–5.81)
RDW: 17.4 % — ABNORMAL HIGH (ref 11.5–15.5)
WBC: 6.1 10*3/uL (ref 4.0–10.5)
nRBC: 0 % (ref 0.0–0.2)

## 2023-01-25 LAB — GLUCOSE, CAPILLARY
Glucose-Capillary: 76 mg/dL (ref 70–99)
Glucose-Capillary: 88 mg/dL (ref 70–99)

## 2023-01-25 LAB — MAGNESIUM: Magnesium: 1.9 mg/dL (ref 1.7–2.4)

## 2023-01-25 LAB — AMMONIA: Ammonia: 65 umol/L — ABNORMAL HIGH (ref 9–35)

## 2023-01-25 MED ORDER — LATANOPROST 0.005 % OP SOLN
1.0000 [drp] | Freq: Every day | OPHTHALMIC | Status: DC
  Administered 2023-01-25 – 2023-01-28 (×3): 1 [drp] via OPHTHALMIC
  Filled 2023-01-25: qty 2.5

## 2023-01-25 MED ORDER — DEXTROSE-SODIUM CHLORIDE 5-0.45 % IV SOLN: INTRAVENOUS | Status: AC

## 2023-01-25 MED ORDER — FUROSEMIDE 40 MG PO TABS
60.0000 mg | ORAL_TABLET | Freq: Every day | ORAL | Status: DC
  Administered 2023-01-25 – 2023-01-29 (×5): 60 mg via ORAL
  Filled 2023-01-25 (×5): qty 1

## 2023-01-25 MED ORDER — DEXTROSE 50 % IV SOLN
INTRAVENOUS | Status: AC
  Filled 2023-01-25: qty 50

## 2023-01-25 MED ORDER — ATORVASTATIN CALCIUM 40 MG PO TABS
40.0000 mg | ORAL_TABLET | Freq: Every day | ORAL | Status: DC
  Administered 2023-01-26 – 2023-01-29 (×4): 40 mg via ORAL
  Filled 2023-01-25 (×4): qty 1

## 2023-01-25 MED ORDER — LACTULOSE 10 GM/15ML PO SOLN
20.0000 g | Freq: Three times a day (TID) | ORAL | Status: DC
  Administered 2023-01-25 – 2023-01-29 (×8): 20 g via ORAL
  Filled 2023-01-25 (×10): qty 30

## 2023-01-25 MED ORDER — DEXTROSE 50 % IV SOLN
1.0000 | Freq: Once | INTRAVENOUS | Status: AC
  Administered 2023-01-25: 50 mL via INTRAVENOUS

## 2023-01-25 MED ORDER — SPIRONOLACTONE 25 MG PO TABS
100.0000 mg | ORAL_TABLET | Freq: Every day | ORAL | Status: DC
  Administered 2023-01-25 – 2023-01-29 (×5): 100 mg via ORAL
  Filled 2023-01-25 (×6): qty 4

## 2023-01-25 MED ORDER — MAGNESIUM SULFATE 2 GM/50ML IV SOLN
2.0000 g | Freq: Once | INTRAVENOUS | Status: AC
  Administered 2023-01-25: 2 g via INTRAVENOUS
  Filled 2023-01-25: qty 50

## 2023-01-25 MED ORDER — TAMSULOSIN HCL 0.4 MG PO CAPS
0.4000 mg | ORAL_CAPSULE | Freq: Every day | ORAL | Status: DC
  Administered 2023-01-25 – 2023-01-29 (×4): 0.4 mg via ORAL
  Filled 2023-01-25 (×5): qty 1

## 2023-01-25 NOTE — Progress Notes (Addendum)
[facility] PROGRESS NOTE   Shakeel Alfonse Ras ZOX:[id number] DOB: 1960/09/25 DOA: 01/23/2023  PCP: Donia Ast., MD  Brief History/Interval Summary: 62 y.o. male with medical history significant of dementia, stroke with left-sided deficits, CKD 3B, PUD, hypertension, COPD, cirrhosis, anemia, thrombocytopenia presenting as a code stroke.  He was hospitalized for further management.  Consultants: Neurology  Procedures: None yet    Subjective/Interval History: Patient noted to be awake alert but confused.  He does have dementia at baseline.   Assessment/Plan:  Concern for acute stroke/history of previous stroke/history of dementia Sent from nursing facility after staff noted left-sided facial droop.  Due to his dementia history was so difficult to obtain from the patient. Previous history of subarachnoid hemorrhage.  He has left-sided weakness from previous stroke. MRI brain did not show acute stroke.  CT angiogram head and neck was completed.  Neurology to review. LDL 133.  Will add statin once he is cleared for oral intake.  LFTs are noted to be abnormal but these can be monitored closely once statin has been initiated.  If there is a significant rise in LFTs after statin initiation then statin will need to be discontinued. Swallow evaluation is pending.  Patient remains NPO. HbA1c is 4.8. Echocardiogram shows LVEF of 55 to 60% with grade 1 diastolic dysfunction.  No significant valvular abnormalities. PT and OT evaluation.  Hyperammonemia Ammonia level noted to be significantly elevated at 137.  This likely contributed to his symptoms.  Started on lactulose enemas with fluids ammonia level has improved to 65 this morning.  He did have some mild rectal bleeding with the enemas which are currently on hold.  Once he is cleared for oral intake he can be started on oral lactulose.   Hypomagnesemia Recheck magnesium level this morning.  History of liver  cirrhosis/thrombocytopenia He does have ascites and peripheral edema.  He has had multiple paracentesis previously Has history of alcohol use and hepatitis C. LFTs were noted to be stable. Diuretics on hold currently due to n.p.o. status.  Should be able to resume today.  Does not have significant ascites. Patient is low but stable.  Hypoglycemia Likely secondary to poor oral intake.  D50 to be given.  History of COPD Continue home medications.  Normocytic anemia No evidence of overt bleeding.  Likely anemia of chronic disease.  Chronic kidney disease stage IIIb Renal function noted to be stable.  History of peptic ulcer disease Reinitiate PPI when able to take orally.  Essential hypertension Permissive hypertension being allowed.  Should be able to resume his antihypertensives when cleared for oral intake.  DVT Prophylaxis: SCDs Code Status: Full code Family Communication: No family at bedside.  He is a ward of the state. Disposition Plan: Back to SNF when medically stable      Medications: Scheduled:  dextrose  1 ampule Intravenous Once   enoxaparin (LOVENOX) injection  40 mg Subcutaneous Daily   lactulose  300 mL Rectal BID   mometasone-formoterol  2 puff Inhalation BID   Continuous:  sodium chloride     WJX:BJYNWG chloride, acetaminophen **OR** acetaminophen (TYLENOL) oral liquid 160 mg/5 mL **OR** acetaminophen, albuterol, LORazepam  Antibiotics: Anti-infectives (From admission, onward)    None       Objective:  Vital Signs  Vitals:   01/25/23 0018 01/25/23 0400 01/25/23 0826 01/25/23 0845  BP: (!) 151/94 (!) 159/105 (!) 170/69   Pulse:  88 86   Resp: 16 16 (!) 24   Temp: 98.4 F (36.9  C) 98 F (36.7 C) 98.1 F (36.7 C)   TempSrc: Oral  Axillary   SpO2: 96% 98% 98% 98%    Intake/Output Summary (Last 24 hours) at 01/25/2023 1610 Last data filed at 01/24/2023 1530 Gross per 24 hour  Intake 50 ml  Output 355 ml  Net -305 ml   There were no  vitals filed for this visit.   General appearance: Awake alert.  In no distress.  Distracted. Resp: Clear to auscultation bilaterally.  Normal effort Cardio: S1-S2 is normal regular.  No S3-S4.  No rubs murmurs or bruit GI: Abdomen is soft.  Nontender nondistended.  Bowel sounds are present normal.  No masses organomegaly Extremities: No edema.  Full range of motion of lower extremities. Neurologic: Left-sided weakness from previous stroke.   Lab Results:  Data Reviewed: I have personally reviewed following labs and reports of the imaging studies  CBC: Recent Labs  Lab 01/23/23 1135 01/23/23 1141 01/25/23 0659  WBC 5.3  --  6.1  NEUTROABS 2.9  --   --   HGB 11.2* 11.6* 10.1*  HCT 32.6* 34.0* 29.4*  MCV 94.5  --  96.1  PLT 63*  --  84*     Basic Metabolic Panel: Recent Labs  Lab 01/23/23 1135 01/23/23 1141 01/23/23 2040 01/25/23 0659  NA 136 141  --  137  K 4.1 4.2  --  3.9  CL 103 102  --  106  CO2 25  --   --  22  GLUCOSE 90 86  --  52*  BUN 20 22  --  19  CREATININE 1.19 1.00  --  1.09  CALCIUM 8.2*  --   --  8.1*  MG  --   --  1.6*  --      GFR: Estimated Creatinine Clearance: 63.6 mL/min (by C-G formula based on SCr of 1.09 mg/dL).  Liver Function Tests: Recent Labs  Lab 01/23/23 1135 01/25/23 0659  AST 66* 81*  ALT 38 47*  ALKPHOS 75 67  BILITOT 2.0* 3.1*  PROT 7.0 6.8  ALBUMIN 1.7* 1.7*     Recent Labs  Lab 01/23/23 1230 01/25/23 0659  AMMONIA 137* 65*     Coagulation Profile: Recent Labs  Lab 01/23/23 2040  INR 1.4*      CBG: Recent Labs  Lab 01/23/23 1136  GLUCAP 91     Lipid Profile: Recent Labs    01/24/23 0434  CHOL 179  HDL 32*  LDLCALC 133*  TRIG 69  CHOLHDL 5.6      Radiology Studies: CT ANGIO HEAD NECK W WO CM  Result Date: 01/24/2023 CLINICAL DATA:  Neuro deficit, acute, stroke suspected EXAM: CT ANGIOGRAPHY HEAD AND NECK WITH AND WITHOUT CONTRAST TECHNIQUE: Multidetector CT imaging of the head  and neck was performed using the standard protocol during bolus administration of intravenous contrast. Multiplanar CT image reconstructions and MIPs were obtained to evaluate the vascular anatomy. Carotid stenosis measurements (when applicable) are obtained utilizing NASCET criteria, using the distal internal carotid diameter as the denominator. RADIATION DOSE REDUCTION: This exam was performed according to the departmental dose-optimization program which includes automated exposure control, adjustment of the mA and/or kV according to patient size and/or use of iterative reconstruction technique. CONTRAST:  75mL OMNIPAQUE IOHEXOL 350 MG/ML SOLN COMPARISON:  Same day MRI head. FINDINGS: CT HEAD FINDINGS Motion limited.  Within this limitation: Brain: No evidence of acute large vascular territory infarct, acute hemorrhage, mass lesion midline shift. Extensive confluent and patchy  white matter hypodensities, nonspecific but compatible with chronic microvascular ischemic disease. Multiple remote infarcts and cerebral atrophy, better characterized on same day MRI. Vascular: See below. Skull: No acute fracture. Sinuses/Orbits: The knee clear sinuses.  No acute orbital findings. Other: No mastoid effusions. Review of the MIP images confirms the above findings CTA NECK FINDINGS Aortic arch: Aortic atherosclerosis. Great vessel origins are patent. Right carotid system: Atherosclerosis at the carotid bifurcation with approximately 50% stenosis of the ICA origin. Left carotid system: No evidence of dissection, stenosis (50% or greater), or occlusion. Vertebral arteries: Significantly motion limited evaluation of vertebral artery origins with potentially hemodynamically significant stenosis bilaterally. Skeleton: No acute abnormality on limited assessment. Other neck: No acute abnormality on limited assessment. Upper chest: Large left pleural effusion. Review of the MIP images confirms the above findings CTA HEAD FINDINGS  Anterior circulation: Bilateral intracranial ICAs are patent with severe stenosis bilaterally. Bilateral MCAs and ACAs are patent without proximal high-grade stenosis. 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 Posterior circulation: Bilateral intradural vertebral arteries, basilar artery and bilateral posterior cerebral arteries are patent without proximal high-grade stenosis. Venous sinuses: As permitted by contrast timing, patent. Anatomic variants: Detailed above. Review of the MIP images confirms the above findings IMPRESSION: 1. No emergent large vessel occlusion. 2. Severe bilateral intracranial ICA stenosis. 3. Approximately 50% stenosis of the right ICA origin in the neck. 4. Significantly motion limited evaluation of the vertebral artery origins with potentially hemodynamically significant stenosis bilaterally. A repeat CTA could confirm/better evaluate if clinically warranted. 5. Large left pleural effusion. 6. Aortic Atherosclerosis (ICD10-I70.0). Electronically Signed   By: Feliberto Harts M.D.   On: 01/24/2023 17:28   MR BRAIN WO CONTRAST  Result Date: 01/24/2023 CLINICAL DATA:  Neuro deficit, acute, stroke suspected EXAM: MRI HEAD WITHOUT CONTRAST TECHNIQUE: Multiplanar, multiecho pulse sequences of the brain and surrounding structures were obtained without intravenous contrast. COMPARISON:  MRI Brain 09/10/22 FINDINGS: Brain: Negative for an acute infarct. No hydrocephalus. No extra-axial fluid collection. Unchanged size and shape of the ventricular system. There is sequela of severe chronic microvascular ischemic change with extensive periventricular and subcortical white matter signal abnormality. There are chronic infarcts in the bilateral centrum semiovale and basal ganglia. There is also a chronic infarct in the right hemi pons. Generalized volume loss. Hemorrhage. Vascular: Normal flow voids. Skull and upper cervical spine: Normal marrow signal. Sinuses/Orbits: No middle ear or  mastoid effusion. Mild mucosal thickening in the bilateral ethmoid sinuses. Orbits are unremarkable. Other: None. IMPRESSION: 1.  No acute intracranial process. 2. Redemonstrated sequela of severe chronic microvascular ischemic change with multiple chronic infarcts and generalized volume loss. Electronically Signed   By: Lorenza Cambridge M.D.   On: 01/24/2023 14:34   ECHOCARDIOGRAM COMPLETE  Result Date: 01/24/2023    ECHOCARDIOGRAM REPORT   Patient Name:   [facility] Date of Exam: 01/24/2023 Medical Rec #:  [id number]    Height:       63.0 in Accession #:    [id number]   Weight:       174.2 lb Date of Birth:  05-29-1961     BSA:          1.823 m Patient Age:    62 years     BP:           136/78 mmHg Patient Gender: M            HR:           95 bpm. Exam Location:  Inpatient  Procedure: 2D Echo, Cardiac Doppler and Color Doppler Indications:    Stroke  History:        Patient has prior history of Echocardiogram examinations, most                 recent 09/11/2022. Stroke; Risk Factors:Hypertension.  Sonographer:    Darlys Gales Referring Phys: [id number] DEVON SHAFER  Sonographer Comments: Technically difficult due hemiparesis. IMPRESSIONS  1. Left ventricular ejection fraction, by estimation, is 55 to 60%. The left ventricle has normal function. Left ventricular endocardial border not optimally defined to evaluate regional wall motion. Left ventricular diastolic parameters are consistent with Grade I diastolic dysfunction (impaired relaxation).  2. Right ventricular systolic function is normal. The right ventricular size is normal. Tricuspid regurgitation signal is inadequate for assessing PA pressure.  3. Left atrial size was mildly dilated.  4. The mitral valve was not well visualized. Trivial mitral valve regurgitation. No evidence of mitral stenosis.  5. The aortic valve is tricuspid. Aortic valve regurgitation is not visualized. No aortic stenosis is present.  6. IVC not visualized.  7. Technically difficult study,  very poor images. FINDINGS  Left Ventricle: Left ventricular ejection fraction, by estimation, is 55 to 60%. The left ventricle has normal function. Left ventricular endocardial border not optimally defined to evaluate regional wall motion. The left ventricular internal cavity size was normal in size. There is no left ventricular hypertrophy. Left ventricular diastolic parameters are consistent with Grade I diastolic dysfunction (impaired relaxation). Right Ventricle: The right ventricular size is normal. No increase in right ventricular wall thickness. Right ventricular systolic function is normal. Tricuspid regurgitation signal is inadequate for assessing PA pressure. Left Atrium: Left atrial size was mildly dilated. Right Atrium: Right atrial size was normal in size. Pericardium: There is no evidence of pericardial effusion. Mitral Valve: The mitral valve was not well visualized. Trivial mitral valve regurgitation. No evidence of mitral valve stenosis. Tricuspid Valve: The tricuspid valve is normal in structure. Tricuspid valve regurgitation is trivial. Aortic Valve: The aortic valve is tricuspid. Aortic valve regurgitation is not visualized. No aortic stenosis is present. Pulmonic Valve: The pulmonic valve was normal in structure. Pulmonic valve regurgitation is not visualized. Aorta: The aortic root is normal in size and structure. Venous: The inferior vena cava was not well visualized. IAS/Shunts: The interatrial septum was not well visualized.  LEFT VENTRICLE PLAX 2D LVIDd:         4.30 cm   Diastology LVIDs:         3.10 cm   LV e' medial:    8.27 cm/s LV PW:         0.70 cm   LV E/e' medial:  6.8 LV IVS:        0.80 cm   LV e' lateral:   9.79 cm/s LVOT diam:     1.90 cm   LV E/e' lateral: 5.8 LVOT Area:     2.84 cm  RIGHT VENTRICLE RV S prime:     28.10 cm/s TAPSE (M-mode): 2.2 cm LEFT ATRIUM           Index LA Vol (A4C): 66.6 ml 36.53 ml/m  MITRAL VALVE MV Area (PHT): 2.93 cm    SHUNTS MV Decel Time:  259 msec    Systemic Diam: 1.90 cm MV E velocity: 56.60 cm/s MV A velocity: 58.70 cm/s MV E/A ratio:  0.96 Dalton McleanMD Electronically signed by Wilfred Lacy Signature Date/Time: 01/24/2023/10:05:50 AM    Final    DG  Abdomen 1 View  Result Date: 01/23/2023 CLINICAL DATA:  Evaluate for foreign bodies prior to MRI. EXAM: ABDOMEN - 1 VIEW COMPARISON:  September 14, 2022. FINDINGS: The bowel gas pattern is normal. No radio-opaque calculi or other significant radiographic abnormality are seen. IMPRESSION: No radiopaque foreign body is noted.  No abnormal bowel dilatation. Aortic Atherosclerosis (ICD10-I70.0). Electronically Signed   By: Lupita Raider M.D.   On: 01/23/2023 14:28   DG Chest Portable 1 View  Result Date: 01/23/2023 CLINICAL DATA:  Altered mental status EXAM: PORTABLE CHEST 1 VIEW COMPARISON:  01/11/2023 FINDINGS: Heart size is normal. Aortic atherosclerosis as seen previously. Right lung is clear. Question mild atelectasis or infiltrate at the left lung base. Patient's left arm overlies that region to some degree. IMPRESSION: Question mild atelectasis or infiltrate at the left lung base. Electronically Signed   By: Paulina Fusi M.D.   On: 01/23/2023 12:41   CT HEAD CODE STROKE WO CONTRAST  Result Date: 01/23/2023 CLINICAL DATA:  Code stroke.  Left-sided facial droop EXAM: CT HEAD WITHOUT CONTRAST TECHNIQUE: Contiguous axial images were obtained from the base of the skull through the vertex without intravenous contrast. RADIATION DOSE REDUCTION: This exam was performed according to the departmental dose-optimization program which includes automated exposure control, adjustment of the mA and/or kV according to patient size and/or use of iterative reconstruction technique. COMPARISON:  MRI Brain 09/10/22 FINDINGS: Brain: No evidence of acute infarction, hemorrhage, hydrocephalus, extra-axial collection or mass lesion/mass effect. There is sequela of severe chronic microvascular ischemic change  with a extensive periventricular hypodensity, as seen on prior brain MRI dated 09/10/2022. There are chronic infarcts in the right corona radiata and centrum semiovale. Vascular: No hyperdense vessel or unexpected calcification. Skull: Normal. Negative for fracture or focal lesion. Sinuses/Orbits: No middle ear or mastoid effusion. Paranasal sinuses are clear. Orbits are unremarkable. Other: None. ASPECTS [facility] Stroke Program Early CT Score): 10 when accounting for chronic findings. IMPRESSION: 1. No hemorrhage or CT evidence of an acute cortical infarct. 2. Sequela of severe chronic microvascular ischemic change with chronic infarcts in the right corona radiata and centrum semiovale. Findings were discussed with Dr. Selina Cooley on 01/23/23 at 12:13 PM. Electronically Signed   By: Lorenza Cambridge M.D.   On: 01/23/2023 12:16       LOS: 1 day   Leeya Rusconi  [facility] Pager on [email or web address]  01/25/2023, 9:03 AM

## 2023-01-25 NOTE — Progress Notes (Signed)
Pt received 3/4 of Lactulose enema as ordered during the shift, however, stopped when blood tinged return with solid brown balls of stool with small blood clots, did not proceed with the remaining enema to prevent discomfort or complication.Will update RN in handoff. Pt passed large amount of flatus/gas and notice abd less distended. Did not show signs of discomfort when enema was stopped. SRP, RN

## 2023-01-25 NOTE — TOC Progression Note (Signed)
Transition of Care [facility]) - Progression Note    Patient Details  Name: Jacob Bass MRN: [id number] Date of Birth: 1961/05/20  Transition of Care [facility]) CM/SW Contact  Baldemar Lenis, [place] Phone Number: 01/25/2023, 10:48 AM  Clinical Narrative:   CSW updated by MD that plan is for patient to return to LTC tomorrow. CSW updated [facility], they are aware. CSW to follow.    Expected Discharge Plan: Skilled Nursing Facility Barriers to Discharge: Continued Medical Work up  Expected Discharge Plan and Services     Post Acute Care Choice: Skilled Nursing Facility Living arrangements for the past 2 months: Skilled Nursing Facility                                       Social Determinants of Health (SDOH) Interventions SDOH Screenings   Food Insecurity: No Food Insecurity (01/11/2023)  Transportation Needs: Unmet Transportation Needs (01/11/2023)  Utilities: Patient Declined (10/29/2022)  Tobacco Use: Medium Risk (01/23/2023)    Readmission Risk Interventions    10/30/2022    9:16 AM  Readmission Risk Prevention Plan  Transportation Screening Complete  Medication Review (RN Care Manager) Complete  PCP or Specialist appointment within 3-5 days of discharge Complete  HRI or Home Care Consult Complete  SW Recovery Care/Counseling Consult Complete  Palliative Care Screening Not Applicable  Skilled Nursing Facility Complete

## 2023-01-25 NOTE — Plan of Care (Signed)
  Problem: Education: Goal: Knowledge of disease or condition will improve Outcome: Progressing Goal: Knowledge of secondary prevention will improve (MUST DOCUMENT ALL) Outcome: Progressing   

## 2023-01-25 NOTE — Evaluation (Signed)
Clinical/Bedside Swallow Evaluation Patient Details  Name: Jacob Bass MRN: [id number] Date of Birth: 1961/04/24  Today's Date: 01/25/2023 Time: SLP Start Time (ACUTE ONLY): 1055 SLP Stop Time (ACUTE ONLY): 1121 SLP Time Calculation (min) (ACUTE ONLY): 26 min  Past Medical History:  Past Medical History:  Diagnosis Date   CHF (congestive heart failure) (HCC)    Cirrhosis (HCC)    COPD (chronic obstructive pulmonary disease) (HCC)    ETOH abuse    Gastric ulcer    Hypertension    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   Stroke [facility])    Past Surgical History:  Past Surgical History:  Procedure Laterality Date   BIOPSY  10/19/2018   Procedure: BIOPSY;  Surgeon: Benancio Deeds, MD;  Location: [facility] ENDOSCOPY;  Service: Gastroenterology;;   ESOPHAGOGASTRODUODENOSCOPY (EGD) WITH PROPOFOL N/A 10/19/2018   Procedure: ESOPHAGOGASTRODUODENOSCOPY (EGD);  Surgeon: Benancio Deeds, MD;  Location: [facility] ENDOSCOPY;  Service: Gastroenterology;  Laterality: N/A;   IR PARACENTESIS  09/19/2022   IR PARACENTESIS  09/25/2022   IR PARACENTESIS  10/03/2022   IR PARACENTESIS  10/11/2022   IR PARACENTESIS  10/24/2022   IR PARACENTESIS  10/30/2022   IR PARACENTESIS  12/24/2022   HPI:  Patient is a 62 y.o. male with PMH: dementia, CVA with left-sided deficits, dysphagia and aphasia (Janurary 2024), CHF, cirrhosis, COPD, ETOH abuse, HTN. Following prolonged hospitalization following in January of this year, patient had discharged to SNF. He presented to the ED from SNF on 01/23/2023 as a code stroke when SNF staff noted his pills from morning were still in his mouth. In ED, Chest x-ray with mild atelectasis versus infiltrate of the left lower lobe,  CT head showed no acute abnormality and did show chronic infarct. MRI negative.    Assessment / Plan / Recommendation  Clinical Impression  Pt known to SLP service from prior admissions. Overall, his swallowing function is similar to what has been documented during  prior sessions. He did not appear to grossly aspirate while eating purees and drinking thin liquids during today's evaluation.  He had audible congestion at baseline prior to any POs being offered. This did not worsen during assessment. He nodded when asked if he was hungry and was able to hold the cup and drink water from a straw without obvious difficulty. He ate applesauce with good oral attention and no residue post-swallow. It is reasonable to resume his baseline diet of purees/thin liquids. However, he continues to grow more frail, is increasingly less communicative, requires more help with feeding.  His risk for developing adverse consequences of aspiration appears to be increasing. Palliative care, fortunately, has been consulted.  Recommend dysphagia 1 diet and thin liquids; crush meds.  Acute care SLP f/u is not indicated; we will sign off. SLP Visit Diagnosis: Dysphagia, oropharyngeal phase (R13.12)           Diet Recommendation   Dysphagia 1, thin liquids  Medication Administration: Crushed with puree    Other  Recommendations Oral Care Recommendations: Oral care BID    Recommendations for follow up therapy are one component of a multi-disciplinary discharge planning process, led by the attending physician.  Recommendations may be updated based on patient status, additional functional criteria and insurance authorization.  Follow up Recommendations No SLP follow up                          Prognosis Prognosis for improved oropharyngeal function: Guarded      Swallow  Study   General Date of Onset: 01/23/23 HPI: Patient is a 62 y.o. male with PMH: dementia, CVA with left-sided deficits, dysphagia and aphasia (Janurary 2024), CHF, cirrhosis, COPD, ETOH abuse, HTN. Following prolonged hospitalization following in January of this year, patient had discharged to SNF. He presented to the ED from SNF on 01/23/2023 as a code stroke when SNF staff noted his pills from morning were  still in his mouth. In ED, Chest x-ray with mild atelectasis versus infiltrate of the left lower lobe,  CT head showed no acute abnormality and did show chronic infarct. MRI negative. Type of Study: Bedside Swallow Evaluation Previous Swallow Assessment: multiple prior swallow evals Diet Prior to this Study: NPO Temperature Spikes Noted: No Respiratory Status: Room air History of Recent Intubation: No Behavior/Cognition: Alert;Confused Oral Cavity Assessment: Within Functional Limits Oral Care Completed by SLP: Recent completion by staff Oral Cavity - Dentition: Edentulous Vision: Functional for self-feeding Self-Feeding Abilities: Needs assist Patient Positioning: Upright in bed Baseline Vocal Quality: Low vocal intensity Volitional Cough: Weak Volitional Swallow: Able to elicit    Oral/Motor/Sensory Function     Ice Chips Ice chips: Impaired Presentation: Spoon Oral Phase Functional Implications: Prolonged oral transit Pharyngeal Phase Impairments: Suspected delayed Swallow   Thin Liquid Thin Liquid: Within functional limits Presentation: Straw    Nectar Thick Nectar Thick Liquid: Not tested   Honey Thick Honey Thick Liquid: Not tested   Puree Puree: Within functional limits   Solid     Solid: Not tested      Jacob Bass Jacob Bass 01/25/2023,11:22 AM  Jacob Bass L. Jacob Frederic, MA CCC/SLP Clinical Specialist - Acute Care SLP Acute Rehabilitation Services Office number [phone]

## 2023-01-25 NOTE — Consult Note (Signed)
Consultation Note Date: 01/25/2023 at 1000  Patient Name: Jacob Bass  DOB: Nov 13, 1960  MRN: [id number]  Age / Sex: 62 y.o., male  PCP: Donia Ast., MD Referring Physician: Osvaldo Shipper, MD  Reason for Consultation: Establishing goals of care  HPI/Patient Profile: 62 y.o. male  with past medical history of dementia, stroke with left-sided deficits, CKD (stage IIIb), PUD, HTN, COPD, cirrhosis, anemia and thrombocytopenia admitted on 01/23/2023 with code stroke.  Patient presented with left facial droop.  MRI of brain did not reveal acute stroke.  CT angiogram of head and neck revealed no emergent large vessel occlusion. As per neuro, plan is to continue aspirin for stroke prevention and no further stroke workup is necessary.   Clinical Assessment and Goals of Care: I have reviewed medical records including EPIC notes, labs and imaging, assessed the patient and then met with patient at bedside to discuss diagnosis prognosis, GOC, EOL wishes, disposition and options.  Patient acknowledges my presence.  He is able to make some vocalizations to yes/no questions.  However, patient is unable to participate in goals of care or medical decision making independently.  Symptoms assessed.  Patient denied pain or discomfort at this time.  He endorses he is hungry.  When asked what he would like to eat, patient stated "pork chop". Patient compliant with finger stick for CBG check but was not participatory or vocal for the remainder of my assessment.   SLP at bedside during my assessment to complete bedside swallow evaluation.  After assessing the patient, I attempted to speak with patient's legal guardian over the phone.  No answer.  HIPAA compliant voicemail left with PMT contact info.  Plan remains for patient to return to LTC tomorrow.  PMT will remain available to patient throughout his hospitalization.  Full  code and full scope remain.  Primary Decision Maker LEGAL GUARDIAN  Physical Exam Vitals reviewed.  Constitutional:      General: He is not in acute distress.    Appearance: He is normal weight.  HENT:     Head: Normocephalic.     Mouth/Throat:     Mouth: Mucous membranes are moist.  Eyes:     Pupils: Pupils are equal, round, and reactive to light.  Pulmonary:     Effort: Pulmonary effort is normal.  Abdominal:     Palpations: Abdomen is soft.  Musculoskeletal:     Comments: Generalized weakness  Skin:    General: Skin is warm and dry.  Neurological:     Mental Status: He is alert.  Psychiatric:        Behavior: Behavior normal.     Palliative Assessment/Data: 30%     Thank you for this consult. Palliative medicine will continue to follow and assist holistically.   Time Total: 40 minutes Greater than 50%  of this time was spent counseling and coordinating care related to the above assessment and plan.  Signed by: Georgiann Cocker, DNP, FNP-BC Palliative Medicine    Please contact Palliative Medicine Team phone at [phone]  for questions and concerns.  For individual provider: See Loretha Stapler

## 2023-01-26 ENCOUNTER — Inpatient Hospital Stay (HOSPITAL_COMMUNITY): Payer: Medicaid Other

## 2023-01-26 DIAGNOSIS — K7682 Hepatic encephalopathy: Secondary | ICD-10-CM | POA: Diagnosis not present

## 2023-01-26 DIAGNOSIS — Z7189 Other specified counseling: Secondary | ICD-10-CM

## 2023-01-26 DIAGNOSIS — F0390 Unspecified dementia without behavioral disturbance: Secondary | ICD-10-CM | POA: Diagnosis not present

## 2023-01-26 DIAGNOSIS — R4182 Altered mental status, unspecified: Secondary | ICD-10-CM | POA: Diagnosis not present

## 2023-01-26 DIAGNOSIS — E162 Hypoglycemia, unspecified: Secondary | ICD-10-CM | POA: Diagnosis not present

## 2023-01-26 DIAGNOSIS — Z515 Encounter for palliative care: Secondary | ICD-10-CM | POA: Diagnosis not present

## 2023-01-26 LAB — GLUCOSE, CAPILLARY
Glucose-Capillary: 106 mg/dL — ABNORMAL HIGH (ref 70–99)
Glucose-Capillary: 113 mg/dL — ABNORMAL HIGH (ref 70–99)
Glucose-Capillary: 116 mg/dL — ABNORMAL HIGH (ref 70–99)
Glucose-Capillary: 96 mg/dL (ref 70–99)

## 2023-01-26 LAB — COMPREHENSIVE METABOLIC PANEL
ALT: 45 U/L — ABNORMAL HIGH (ref 0–44)
AST: 82 U/L — ABNORMAL HIGH (ref 15–41)
Albumin: 1.6 g/dL — ABNORMAL LOW (ref 3.5–5.0)
Alkaline Phosphatase: 68 U/L (ref 38–126)
Anion gap: 7 (ref 5–15)
BUN: 18 mg/dL (ref 8–23)
CO2: 22 mmol/L (ref 22–32)
Calcium: 7.8 mg/dL — ABNORMAL LOW (ref 8.9–10.3)
Chloride: 108 mmol/L (ref 98–111)
Creatinine, Ser: 1.06 mg/dL (ref 0.61–1.24)
GFR, Estimated: >60 mL/min (ref >60)
Glucose, Bld: 110 mg/dL — ABNORMAL HIGH (ref 70–99)
Potassium: 3.4 mmol/L — ABNORMAL LOW (ref 3.5–5.1)
Sodium: 137 mmol/L (ref 135–145)
Total Bilirubin: 2.2 mg/dL — ABNORMAL HIGH (ref 0.3–1.2)
Total Protein: 6.4 g/dL — ABNORMAL LOW (ref 6.5–8.1)

## 2023-01-26 LAB — CBC
HCT: 26.4 % — ABNORMAL LOW (ref 39.0–52.0)
Hemoglobin: 9 g/dL — ABNORMAL LOW (ref 13.0–17.0)
MCH: 32.5 pg (ref 26.0–34.0)
MCHC: 34.1 g/dL (ref 30.0–36.0)
MCV: 95.3 fL (ref 80.0–100.0)
Platelets: 89 10*3/uL — ABNORMAL LOW (ref 150–400)
RBC: 2.77 MIL/uL — ABNORMAL LOW (ref 4.22–5.81)
RDW: 17.5 % — ABNORMAL HIGH (ref 11.5–15.5)
WBC: 6.3 10*3/uL (ref 4.0–10.5)
nRBC: 0 % (ref 0.0–0.2)

## 2023-01-26 LAB — MAGNESIUM: Magnesium: 2 mg/dL (ref 1.7–2.4)

## 2023-01-26 MED ORDER — POTASSIUM CHLORIDE CRYS ER 20 MEQ PO TBCR: 40.0000 meq | EXTENDED_RELEASE_TABLET | Freq: Once | ORAL | Status: DC

## 2023-01-26 NOTE — Progress Notes (Addendum)
[facility] PROGRESS NOTE   Jacob Bass WJX:[id number] DOB: 04/02/1961 DOA: 01/23/2023  PCP: Donia Ast., MD  Brief History/Interval Summary: 62 y.o. male with medical history significant of dementia, stroke with left-sided deficits, CKD 3B, PUD, hypertension, COPD, cirrhosis, anemia, thrombocytopenia presenting as a code stroke.  He was hospitalized for further management.  Consultants: Neurology  Procedures: None yet    Subjective/Interval History: Noted to be awake but a little bit more lethargic this morning compared to yesterday.  Discussed with nursing staff.  They mention the patient is not really eating or drinking.  He is pulling food in the mouth.  He had to be suctioned overnight.    Assessment/Plan:  Acute hepatic encephalopathy/previous history of stroke/history of dementia Sent from nursing facility after staff noted left-sided facial droop.  Due to his dementia history was so difficult to obtain from the patient. Previous history of subarachnoid hemorrhage.  He has left-sided weakness from previous stroke. MRI brain did not show acute stroke.  CT angiogram head and neck was completed.  Neurology to review. LDL 133.  Statin has been added.  LFTs are noted to be abnormal but these can be monitored closely once statin has been initiated.  If there is a significant rise in LFTs after statin initiation then statin will need to be discontinued. Seen by speech therapy.  Currently on dysphagia 1 diet.  But not doing well according to nursing staff. HbA1c is 4.8. Echocardiogram shows LVEF of 55 to 60% with grade 1 diastolic dysfunction.  No significant valvular abnormalities. Ammonia level noted to be significantly elevated at 137.  This likely contributed to his symptoms.  Started on lactulose enemas with fluids ammonia level has improved to 65.  Patient over to oral lactulose.  Continue for now.  Recheck ammonia level in the morning.    Hypomagnesemia Supplemented  History of liver cirrhosis/thrombocytopenia He does have ascites and peripheral edema.  He has had multiple paracentesis previously Has history of alcohol use and hepatitis C. LFTs were noted to be stable. Diuretics were resumed.  Does have ascites but not significant enough.  Hypoglycemia Likely secondary to poor oral intake.  Given D50 with improvement.  Continue with IV fluids for now.  History of COPD Noted to be slightly dyspneic this morning.  Will check chest x-ray.  Normocytic anemia No evidence of overt bleeding.  Likely anemia of chronic disease.  Chronic kidney disease stage IIIb Renal function noted to be stable.  History of peptic ulcer disease Reinitiate PPI when able to take orally.  Essential hypertension Permissive hypertension was initially allowed.  Now back on diuretics.  Monitor blood pressures closely.  Goals of care Seen by palliative care.  They tried to reach out to the guardian but were not successful.  Patient appears to be declining over the past several weeks to months.  He is now pooling food in his mouth.  He is not a good candidate for tube feedings.  I think we need to firmly establish goals of care and possibly transition him to hospice.  Will request palliative care to continue to follow and try and reach out to his guardian.  Informed by palliative care that they have been unable to reach the guardian/State DSS.  Apparently the State will not be able to decide on CODE STATUS etc. until Tuesday.  Patient has multiple comorbidities as discussed above.  He has had very poor quality of life for the last several months.  He appears to be  progressively declining as I mentioned earlier.  Due to his liver cirrhosis and ascites and other comorbidities he is not really a candidate for artificial nutrition.  In view of his worsening medical conditions as well as current poor quality of life ideally he should be transitioned to  hospice as his life expectancy is less than 6 months.  Heroic measures will also be futile.  He should be transitioned to DNR.  Will wait to hear back from Palliative care before we make this change.  Patient evaluated by palliative care and they also agree with the assessment that any heroic measures would be futile.  In view of this we will change CODE STATUS to DNR.  DVT Prophylaxis: SCDs Code Status: See above Family Communication: No family at bedside.  He is a ward of the state. Disposition Plan: Back to SNF when medically stable      Medications: Scheduled:  atorvastatin  40 mg Oral Daily   enoxaparin (LOVENOX) injection  40 mg Subcutaneous Daily   furosemide  60 mg Oral Daily   lactulose  20 g Oral TID   latanoprost  1 drop Both Eyes QHS   mometasone-formoterol  2 puff Inhalation BID   potassium chloride  40 mEq Oral Once   spironolactone  100 mg Oral Daily   tamsulosin  0.4 mg Oral Daily   Continuous:  sodium chloride     dextrose 5 % and 0.45 % NaCl 75 mL/hr at 01/25/23 0932   WJX:BJYNWG chloride, acetaminophen **OR** acetaminophen (TYLENOL) oral liquid 160 mg/5 mL **OR** acetaminophen, albuterol, LORazepam  Antibiotics: Anti-infectives (From admission, onward)    None       Objective:  Vital Signs  Vitals:   01/26/23 0003 01/26/23 0407 01/26/23 0729 01/26/23 0816  BP: (!) 151/75 (!) 140/78 (!) 150/70   Pulse: (!) 102 (!) 103 (!) 105   Resp: 17 19 18    Temp: 99.3 F (37.4 C) 98.8 F (37.1 C) (!) 97.4 F (36.3 C)   TempSrc: Oral  Oral   SpO2: 100% 100% 100% 100%    Intake/Output Summary (Last 24 hours) at 01/26/2023 0919 Last data filed at 01/25/2023 1500 Gross per 24 hour  Intake 297.42 ml  Output 450 ml  Net -152.58 ml    There were no vitals filed for this visit.   General appearance: Awake but distracted.  Slightly lethargic. Resp: Coarse sounds.  Few crackles.  Mildly tachypneic. Cardio: S1-S2 is normal regular.  No S3-S4.  No rubs  murmurs or bruit GI: Abdomen is soft.  Mildly distended.  Ascites noted.  Nontender. Extremities: Mild edema. Neurologic: Left sided weakness and contractures from previous stroke.   Lab Results:  Data Reviewed: I have personally reviewed following labs and reports of the imaging studies  CBC: Recent Labs  Lab 01/23/23 1135 01/23/23 1141 01/25/23 0659 01/26/23 0330  WBC 5.3  --  6.1 6.3  NEUTROABS 2.9  --   --   --   HGB 11.2* 11.6* 10.1* 9.0*  HCT 32.6* 34.0* 29.4* 26.4*  MCV 94.5  --  96.1 95.3  PLT 63*  --  84* 89*     Basic Metabolic Panel: Recent Labs  Lab 01/23/23 1135 01/23/23 1141 01/23/23 2040 01/25/23 0659 01/26/23 0330  NA 136 141  --  137 137  K 4.1 4.2  --  3.9 3.4*  CL 103 102  --  106 108  CO2 25  --   --  22 22  GLUCOSE 90 86  --  52* 110*  BUN 20 22  --  19 18  CREATININE 1.19 1.00  --  1.09 1.06  CALCIUM 8.2*  --   --  8.1* 7.8*  MG  --   --  1.6* 1.9 2.0     GFR: CrCl cannot be calculated (Unknown ideal weight.).  Liver Function Tests: Recent Labs  Lab 01/23/23 1135 01/25/23 0659 01/26/23 0330  AST 66* 81* 82*  ALT 38 47* 45*  ALKPHOS 75 67 68  BILITOT 2.0* 3.1* 2.2*  PROT 7.0 6.8 6.4*  ALBUMIN 1.7* 1.7* 1.6*     Recent Labs  Lab 01/23/23 1230 01/25/23 0659  AMMONIA 137* 65*     Coagulation Profile: Recent Labs  Lab 01/23/23 2040  INR 1.4*      CBG: Recent Labs  Lab 01/23/23 1136 01/25/23 1058 01/25/23 1502 01/26/23 0917  GLUCAP 91 76 88 96     Lipid Profile: Recent Labs    01/24/23 0434  CHOL 179  HDL 32*  LDLCALC 133*  TRIG 69  CHOLHDL 5.6      Radiology Studies: CT ANGIO HEAD NECK W WO CM  Result Date: 01/24/2023 CLINICAL DATA:  Neuro deficit, acute, stroke suspected EXAM: CT ANGIOGRAPHY HEAD AND NECK WITH AND WITHOUT CONTRAST TECHNIQUE: Multidetector CT imaging of the head and neck was performed using the standard protocol during bolus administration of intravenous contrast. Multiplanar  CT image reconstructions and MIPs were obtained to evaluate the vascular anatomy. Carotid stenosis measurements (when applicable) are obtained utilizing NASCET criteria, using the distal internal carotid diameter as the denominator. RADIATION DOSE REDUCTION: This exam was performed according to the departmental dose-optimization program which includes automated exposure control, adjustment of the mA and/or kV according to patient size and/or use of iterative reconstruction technique. CONTRAST:  75mL OMNIPAQUE IOHEXOL 350 MG/ML SOLN COMPARISON:  Same day MRI head. FINDINGS: CT HEAD FINDINGS Motion limited.  Within this limitation: Brain: No evidence of acute large vascular territory infarct, acute hemorrhage, mass lesion midline shift. Extensive confluent and patchy white matter hypodensities, nonspecific but compatible with chronic microvascular ischemic disease. Multiple remote infarcts and cerebral atrophy, better characterized on same day MRI. Vascular: See below. Skull: No acute fracture. Sinuses/Orbits: The knee clear sinuses.  No acute orbital findings. Other: No mastoid effusions. Review of the MIP images confirms the above findings CTA NECK FINDINGS Aortic arch: Aortic atherosclerosis. Great vessel origins are patent. Right carotid system: Atherosclerosis at the carotid bifurcation with approximately 50% stenosis of the ICA origin. Left carotid system: No evidence of dissection, stenosis (50% or greater), or occlusion. Vertebral arteries: Significantly motion limited evaluation of vertebral artery origins with potentially hemodynamically significant stenosis bilaterally. Skeleton: No acute abnormality on limited assessment. Other neck: No acute abnormality on limited assessment. Upper chest: Large left pleural effusion. Review of the MIP images confirms the above findings CTA HEAD FINDINGS Anterior circulation: Bilateral intracranial ICAs are patent with severe stenosis bilaterally. Bilateral MCAs and ACAs  are patent without proximal high-grade stenosis. 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 Posterior circulation: Bilateral intradural vertebral arteries, basilar artery and bilateral posterior cerebral arteries are patent without proximal high-grade stenosis. Venous sinuses: As permitted by contrast timing, patent. Anatomic variants: Detailed above. Review of the MIP images confirms the above findings IMPRESSION: 1. No emergent large vessel occlusion. 2. Severe bilateral intracranial ICA stenosis. 3. Approximately 50% stenosis of the right ICA origin in the neck. 4. Significantly motion limited evaluation of the vertebral artery origins with potentially hemodynamically significant stenosis bilaterally.  A repeat CTA could confirm/better evaluate if clinically warranted. 5. Large left pleural effusion. 6. Aortic Atherosclerosis (ICD10-I70.0). Electronically Signed   By: Feliberto Harts M.D.   On: 01/24/2023 17:28   MR BRAIN WO CONTRAST  Result Date: 01/24/2023 CLINICAL DATA:  Neuro deficit, acute, stroke suspected EXAM: MRI HEAD WITHOUT CONTRAST TECHNIQUE: Multiplanar, multiecho pulse sequences of the brain and surrounding structures were obtained without intravenous contrast. COMPARISON:  MRI Brain 09/10/22 FINDINGS: Brain: Negative for an acute infarct. No hydrocephalus. No extra-axial fluid collection. Unchanged size and shape of the ventricular system. There is sequela of severe chronic microvascular ischemic change with extensive periventricular and subcortical white matter signal abnormality. There are chronic infarcts in the bilateral centrum semiovale and basal ganglia. There is also a chronic infarct in the right hemi pons. Generalized volume loss. Hemorrhage. Vascular: Normal flow voids. Skull and upper cervical spine: Normal marrow signal. Sinuses/Orbits: No middle ear or mastoid effusion. Mild mucosal thickening in the bilateral ethmoid sinuses. Orbits are unremarkable. Other: None.  IMPRESSION: 1.  No acute intracranial process. 2. Redemonstrated sequela of severe chronic microvascular ischemic change with multiple chronic infarcts and generalized volume loss. Electronically Signed   By: Lorenza Cambridge M.D.   On: 01/24/2023 14:34   ECHOCARDIOGRAM COMPLETE  Result Date: 01/24/2023    ECHOCARDIOGRAM REPORT   Patient Name:   Jacob Bass Date of Exam: 01/24/2023 Medical Rec #:  [id number]    Height:       63.0 in Accession #:    [id number]   Weight:       174.2 lb Date of Birth:  [birth year]     BSA:          1.823 m Patient Age:    62 years     BP:           136/78 mmHg Patient Gender: M            HR:           95 bpm. Exam Location:  Inpatient Procedure: 2D Echo, Cardiac Doppler and Color Doppler Indications:    Stroke  History:        Patient has prior history of Echocardiogram examinations, most                 recent 09/11/2022. Stroke; Risk Factors:Hypertension.  Sonographer:    Darlys Gales Referring Phys: [id number] DEVON SHAFER  Sonographer Comments: Technically difficult due hemiparesis. IMPRESSIONS  1. Left ventricular ejection fraction, by estimation, is 55 to 60%. The left ventricle has normal function. Left ventricular endocardial border not optimally defined to evaluate regional wall motion. Left ventricular diastolic parameters are consistent with Grade I diastolic dysfunction (impaired relaxation).  2. Right ventricular systolic function is normal. The right ventricular size is normal. Tricuspid regurgitation signal is inadequate for assessing PA pressure.  3. Left atrial size was mildly dilated.  4. The mitral valve was not well visualized. Trivial mitral valve regurgitation. No evidence of mitral stenosis.  5. The aortic valve is tricuspid. Aortic valve regurgitation is not visualized. No aortic stenosis is present.  6. IVC not visualized.  7. Technically difficult study, very poor images. FINDINGS  Left Ventricle: Left ventricular ejection fraction, by estimation, is 55 to 60%. The  left ventricle has normal function. Left ventricular endocardial border not optimally defined to evaluate regional wall motion. The left ventricular internal cavity size was normal in size. There is no left ventricular hypertrophy. Left ventricular diastolic parameters are consistent with Grade I  diastolic dysfunction (impaired relaxation). Right Ventricle: The right ventricular size is normal. No increase in right ventricular wall thickness. Right ventricular systolic function is normal. Tricuspid regurgitation signal is inadequate for assessing PA pressure. Left Atrium: Left atrial size was mildly dilated. Right Atrium: Right atrial size was normal in size. Pericardium: There is no evidence of pericardial effusion. Mitral Valve: The mitral valve was not well visualized. Trivial mitral valve regurgitation. No evidence of mitral valve stenosis. Tricuspid Valve: The tricuspid valve is normal in structure. Tricuspid valve regurgitation is trivial. Aortic Valve: The aortic valve is tricuspid. Aortic valve regurgitation is not visualized. No aortic stenosis is present. Pulmonic Valve: The pulmonic valve was normal in structure. Pulmonic valve regurgitation is not visualized. Aorta: The aortic root is normal in size and structure. Venous: The inferior vena cava was not well visualized. IAS/Shunts: The interatrial septum was not well visualized.  LEFT VENTRICLE PLAX 2D LVIDd:         4.30 cm   Diastology LVIDs:         3.10 cm   LV e' medial:    8.27 cm/s LV PW:         0.70 cm   LV E/e' medial:  6.8 LV IVS:        0.80 cm   LV e' lateral:   9.79 cm/s LVOT diam:     1.90 cm   LV E/e' lateral: 5.8 LVOT Area:     2.84 cm  RIGHT VENTRICLE RV S prime:     28.10 cm/s TAPSE (M-mode): 2.2 cm LEFT ATRIUM           Index LA Vol (A4C): 66.6 ml 36.53 ml/m  MITRAL VALVE MV Area (PHT): 2.93 cm    SHUNTS MV Decel Time: 259 msec    Systemic Diam: 1.90 cm MV E velocity: 56.60 cm/s MV A velocity: 58.70 cm/s MV E/A ratio:  0.96 Dalton  McleanMD Electronically signed by Wilfred Lacy Signature Date/Time: 01/24/2023/10:05:50 AM    Final        LOS: 2 days   Jacob Bass  [facility] Pager on [email or web address]  01/26/2023, 9:19 AM

## 2023-01-26 NOTE — Progress Notes (Addendum)
Palliative:  HPI: 62 y.o. male  with past medical history of dementia, stroke with left-sided deficits, CKD (stage IIIb), PUD, HTN, COPD, cirrhosis, anemia and thrombocytopenia admitted on 01/23/2023 with code stroke.  Patient presented with left facial droop. MRI of brain did not reveal acute stroke.  CT angiogram of head and neck revealed no emergent large vessel occlusion. As per neuro, plan is to continue aspirin for stroke prevention and no further stroke workup is necessary.   I met today at Jacob Bass bedside after discussing with Dr. Rito Ehrlich. Jacob Bass is somnolent with secretions draining from mouth. He moans/grunts as verbal response but otherwise not responding. Not following commands. Noted to be pocketing food and unable to tolerate any type of intake at this time. Chart reviewed and palliative notes from Jan/Feb 2024 reviewed with issues of dysphagia/aphasia even then. There was no identified decision maker at the time of previous palliative visits and DSS guardianship was being pursued. Reviewed notes from [facility] stating that he has lived in a facility since 2020 and had fluctuating capacity and orientation since his stroke at that time.   Jacob Bass is very ill. He is sleeping most of the time. Poorly responsive. Not verbal or following commands. He is unable to eat. He has end stage illness with poor prognosis secondary to dementia, previous stroke, underlying liver cirrhosis. He is unable to take in nutrition which is consistent with end stage dementia leading towards end of life. Artificial feeding is not recommended and would not improve his quality of life or reverse his condition. Prognosis is poor likely days to weeks. I strongly recommend DNR status and focus on comfort care to minimize suffering and maintain dignity at end of life. Hospice would be appropriate for Jacob Bass at this time.   I have communicated situation and recommendations to DSS legal guardian on call Rodena Medin. Jacob Bass  reports that there will be no supervisor to sign off on DNR until next Tuesday. I have expressed that this is unfortunate as we have an obligation to act in Jacob Bass best interest and protect him from suffering at the end of his life. Jacob Bass will see if he can locate paperwork to send me to begin process of DNR through DSS.   I discussed with Dr. Rito Ehrlich and Dr. Patterson Hammersmith. Medical team in agreement that the compassionate decision would be for DNR for Jacob Bass as resuscitative efforts in the setting of his advanced illness and multiple co-morbidities would be futile and cruel.   Exam: Somnolent - only moans. Not verbal. Not following commands. Breathing regular but shallow. Unable to manage secretions. Abd tight with ascites. BLE edema 2+. Warm to touch.   Plan: - Recommend DNR status.  - Recommend comfort care and hospice.   75 min  Yong Channel, NP Palliative Medicine Team Pager [phone] (Please see [email or web address] for schedule) Team Phone [phone]    Greater than 50%  of this time was spent counseling and coordinating care related to the above assessment and plan    I have reviewed and discussed the care of this patient in detail with Yong Channel, NP including pertinent patient records, physical exam findings, and data.    I agree with Mrs. Lavone Neri note as documented above.  It is my professional opinion that with a high degree of medical certainty that this patient has an incurable and irreversible condition that will result in his death.  All medically appropriate and reasonable medical interventions have been/ are being  done according to excepted practice and standards of care.  In the event of cardiac or respiratory arrest, an attempt at resuscitation would be unsuccessful in returning this patient to an already fragile and seriously ill state of health and cause unnecessary suffering at the end of life.  I support placing a maintaining a DNR order and support the care plan as  outlined by the primary service.   Alvester Morin, DO Palliative Care Provider [phone]

## 2023-01-27 DIAGNOSIS — K7682 Hepatic encephalopathy: Secondary | ICD-10-CM | POA: Diagnosis not present

## 2023-01-27 DIAGNOSIS — R1312 Dysphagia, oropharyngeal phase: Secondary | ICD-10-CM | POA: Diagnosis not present

## 2023-01-27 LAB — GLUCOSE, CAPILLARY
Glucose-Capillary: 89 mg/dL (ref 70–99)
Glucose-Capillary: 80 mg/dL (ref 70–99)
Glucose-Capillary: 89 mg/dL (ref 70–99)
Glucose-Capillary: 91 mg/dL (ref 70–99)
Glucose-Capillary: 92 mg/dL (ref 70–99)

## 2023-01-27 LAB — CBC
HCT: 24.5 % — ABNORMAL LOW (ref 39.0–52.0)
Hemoglobin: 8.2 g/dL — ABNORMAL LOW (ref 13.0–17.0)
MCH: 32.4 pg (ref 26.0–34.0)
MCHC: 33.5 g/dL (ref 30.0–36.0)
MCV: 96.8 fL (ref 80.0–100.0)
Platelets: 69 10*3/uL — ABNORMAL LOW (ref 150–400)
RBC: 2.53 MIL/uL — ABNORMAL LOW (ref 4.22–5.81)
RDW: 17.6 % — ABNORMAL HIGH (ref 11.5–15.5)
WBC: 8.9 10*3/uL (ref 4.0–10.5)
nRBC: 0 % (ref 0.0–0.2)

## 2023-01-27 LAB — COMPREHENSIVE METABOLIC PANEL
ALT: 37 U/L (ref 0–44)
AST: 68 U/L — ABNORMAL HIGH (ref 15–41)
Albumin: <1.5 g/dL — ABNORMAL LOW (ref 3.5–5.0)
Alkaline Phosphatase: 52 U/L (ref 38–126)
Anion gap: 4 — ABNORMAL LOW (ref 5–15)
BUN: 17 mg/dL (ref 8–23)
CO2: 24 mmol/L (ref 22–32)
Calcium: 7.3 mg/dL — ABNORMAL LOW (ref 8.9–10.3)
Chloride: 107 mmol/L (ref 98–111)
Creatinine, Ser: 1.01 mg/dL (ref 0.61–1.24)
GFR, Estimated: >60 mL/min (ref >60)
Glucose, Bld: 93 mg/dL (ref 70–99)
Potassium: 3.2 mmol/L — ABNORMAL LOW (ref 3.5–5.1)
Sodium: 135 mmol/L (ref 135–145)
Total Bilirubin: 2.5 mg/dL — ABNORMAL HIGH (ref 0.3–1.2)
Total Protein: 5.9 g/dL — ABNORMAL LOW (ref 6.5–8.1)

## 2023-01-27 LAB — AMMONIA: Ammonia: 55 umol/L — ABNORMAL HIGH (ref 9–35)

## 2023-01-27 MED ORDER — POTASSIUM CHLORIDE 10 MEQ/100ML IV SOLN
10.0000 meq | Freq: Once | INTRAVENOUS | Status: AC
  Administered 2023-01-27: 10 meq via INTRAVENOUS
  Filled 2023-01-27: qty 100

## 2023-01-27 MED ORDER — POTASSIUM CHLORIDE 10 MEQ/100ML IV SOLN
10.0000 meq | INTRAVENOUS | Status: AC
  Administered 2023-01-27 (×3): 10 meq via INTRAVENOUS
  Filled 2023-01-27 (×4): qty 100

## 2023-01-27 MED ORDER — DEXTROSE-SODIUM CHLORIDE 5-0.45 % IV SOLN: INTRAVENOUS | Status: DC

## 2023-01-27 NOTE — Progress Notes (Signed)
[facility] PROGRESS NOTE   Jacob Bass:[id number] DOB: 11/15/1960 DOA: 01/23/2023  PCP: Donia Ast., MD  Brief History/Interval Summary: 62 y.o. male with medical history significant of dementia, stroke with left-sided deficits, CKD 3B, PUD, hypertension, COPD, cirrhosis, anemia, thrombocytopenia presenting as a code stroke.  He was hospitalized for further management.  Consultants: Neurology  Procedures: None yet    Subjective/Interval History: Patient noted to be a bit more awake and alert this morning compared to yesterday.  Patient did not have much to eat yesterday per nursing staff.  No other issues reported.    Assessment/Plan:  Acute hepatic encephalopathy/previous history of stroke/history of dementia Sent from nursing facility after staff noted left-sided facial droop.  Due to his dementia history was so difficult to obtain from the patient. Previous history of subarachnoid hemorrhage.  He has left-sided weakness from previous stroke. MRI brain did not show acute stroke.  CT angiogram head and neck was completed.  Results reviewed by neurology and they do not recommend any change in management.   LDL 133.  Statin has been added.  LFTs are noted to be abnormal but these can be monitored closely once statin has been initiated.  If there is a significant rise in LFTs after statin initiation then statin will need to be discontinued. Seen by speech therapy.  Currently on dysphagia 1 diet.  But not doing well according to nursing staff. HbA1c is 4.8. Echocardiogram shows LVEF of 55 to 60% with grade 1 diastolic dysfunction.  No significant valvular abnormalities. Ammonia level was noted to be significantly elevated at 137.  This likely contributed to his symptoms.  Started on lactulose enemas with fluids ammonia level has improved to 65.  Patient over to oral lactulose.  Ammonia level continues to improve.  Mentation appears to have stabilized.  However oral intake  remains poor.  Hypomagnesemia/hypokalemia Replace potassium intravenously.  Magnesium level was normal at 2.0 when last checked.    History of liver cirrhosis/thrombocytopenia He does have ascites and peripheral edema.  He has had multiple paracentesis previously Has history of alcohol use and hepatitis C. LFTs were noted to be stable. Diuretics were resumed.  Does have ascites but not significant enough for him to undergo paracentesis. Platelet counts are low but stable for the most part.  Hypoglycemia Likely secondary to poor oral intake.  Given D50 with improvement.  Continue with IV fluids for now.  History of COPD Chest x-ray without any significant findings.  Saturating normal on room air.  Normocytic anemia No evidence of overt bleeding.  Likely anemia of chronic disease.  Drop in hemoglobin noted but no evidence of overt bleeding.  Chronic kidney disease stage IIIb Renal function noted to be stable.  History of peptic ulcer disease Can resume PPI at discharge.  Essential hypertension Monitor blood pressures closely.  Back on diuretics.  Goals of care Patient appears to be declining over the past several weeks to months.  He has had several admissions in the last few months.  His oral intake has decreased significantly.  Palliative care was consulted.  He is a ward of the state so they attempted to reach DSS to address CODE STATUS.  Unfortunately nobody is available to address this still teams day.  However considering his poor overall prognosis and quality of life any heroic measures including resuscitation will be futile.  In view of this patient was changed over to DNR after he was seen by palliative care who also agreed with  this assessment. Patient is also not a candidate for artificial nutrition due to all of the above issues as well as his ascites and liver cirrhosis.  If his oral intake does not improve in the next 24 to 48 hours will consider transition to hospice.   May need to consider residential hospice versus return to SNF with hospice.    DVT Prophylaxis: SCDs Code Status: DNR Family Communication: No family at bedside.  He is a ward of the state. Disposition Plan: Back to SNF when medically stable      Medications: Scheduled:  atorvastatin  40 mg Oral Daily   enoxaparin (LOVENOX) injection  40 mg Subcutaneous Daily   furosemide  60 mg Oral Daily   lactulose  20 g Oral TID   latanoprost  1 drop Both Eyes QHS   mometasone-formoterol  2 puff Inhalation BID   spironolactone  100 mg Oral Daily   tamsulosin  0.4 mg Oral Daily   Continuous:  sodium chloride     potassium chloride 10 mEq (01/27/23 0907)   NWG:NFAOZH chloride, acetaminophen **OR** acetaminophen (TYLENOL) oral liquid 160 mg/5 mL **OR** acetaminophen, albuterol, LORazepam  Antibiotics: Anti-infectives (From admission, onward)    None       Objective:  Vital Signs  Vitals:   01/26/23 2300 01/27/23 0300 01/27/23 0746 01/27/23 0812  BP: 139/85 124/60 137/73   Pulse: 90 74 82   Resp: 17 16 19    Temp: 98.2 F (36.8 C) 98.6 F (37 C) (!) 97.5 F (36.4 C)   TempSrc: Axillary Axillary Oral   SpO2: 99% 100% 100% 100%    Intake/Output Summary (Last 24 hours) at 01/27/2023 0909 Last data filed at 01/27/2023 0349 Gross per 24 hour  Intake 2712.49 ml  Output 500 ml  Net 2212.49 ml    There were no vitals filed for this visit.   General appearance: Awake alert.  In no distress.  Distracted Resp: Clear to auscultation bilaterally.  Normal effort Cardio: S1-S2 is normal regular.  No S3-S4.  No rubs murmurs or bruit GI: Abdomen is soft.  Nontender nondistended.  Bowel sounds are present normal.  No masses organomegaly Left-sided weakness with contractures from previous stroke   Lab Results:  Data Reviewed: I have personally reviewed following labs and reports of the imaging studies  CBC: Recent Labs  Lab 01/23/23 1135 01/23/23 1141 01/25/23 0659  01/26/23 0330 01/27/23 0445  WBC 5.3  --  6.1 6.3 8.9  NEUTROABS 2.9  --   --   --   --   HGB 11.2* 11.6* 10.1* 9.0* 8.2*  HCT 32.6* 34.0* 29.4* 26.4* 24.5*  MCV 94.5  --  96.1 95.3 96.8  PLT 63*  --  84* 89* 69*     Basic Metabolic Panel: Recent Labs  Lab 01/23/23 1135 01/23/23 1141 01/23/23 2040 01/25/23 0659 01/26/23 0330 01/27/23 0445  NA 136 141  --  137 137 135  K 4.1 4.2  --  3.9 3.4* 3.2*  CL 103 102  --  106 108 107  CO2 25  --   --  22 22 24   GLUCOSE 90 86  --  52* 110* 93  BUN 20 22  --  19 18 17   CREATININE 1.19 1.00  --  1.09 1.06 1.01  CALCIUM 8.2*  --   --  8.1* 7.8* 7.3*  MG  --   --  1.6* 1.9 2.0  --      GFR: CrCl cannot be calculated (Unknown  ideal weight.).  Liver Function Tests: Recent Labs  Lab 01/23/23 1135 01/25/23 0659 01/26/23 0330 01/27/23 0445  AST 66* 81* 82* 68*  ALT 38 47* 45* 37  ALKPHOS 75 67 68 52  BILITOT 2.0* 3.1* 2.2* 2.5*  PROT 7.0 6.8 6.4* 5.9*  ALBUMIN 1.7* 1.7* 1.6* <1.5*     Recent Labs  Lab 01/23/23 1230 01/25/23 0659 01/27/23 0445  AMMONIA 137* 65* 55*     Coagulation Profile: Recent Labs  Lab 01/23/23 2040  INR 1.4*      CBG: Recent Labs  Lab 01/26/23 1604 01/26/23 2030 01/26/23 2336 01/27/23 0314 01/27/23 0820  GLUCAP 113* 116* 106* 89 80     Radiology Studies: DG CHEST PORT 1 VIEW  Result Date: 01/26/2023 CLINICAL DATA:  Dyspnea. EXAM: PORTABLE CHEST 1 VIEW COMPARISON:  Radiograph 01/23/2023 FINDINGS: Significant patient rotation. Additionally the patient has left arm obscures the left lung base due to contracture. Grossly stable heart size and mediastinal contours. Retrocardiac opacity likely representing pleural effusion, when compared with included portions from neck CTA 01/24/2023. This is not well assessed by radiograph. No definite right pleural effusion. Normal pulmonary vasculature. No pneumothorax. IMPRESSION: Rotated exam. Retrocardiac opacity likely representing pleural  effusion, when compared with neck CTA 01/24/2023. This is not well assessed by radiograph due to rotation and patient's arm obscuring the left lung base. Electronically Signed   By: Narda Rutherford M.D.   On: 01/26/2023 15:14       LOS: 3 days   Georgana Romain Rito Ehrlich  [facility] Pager on [email or web address]  01/27/2023, 9:09 AM

## 2023-01-28 DIAGNOSIS — K7682 Hepatic encephalopathy: Secondary | ICD-10-CM | POA: Diagnosis not present

## 2023-01-28 DIAGNOSIS — R1312 Dysphagia, oropharyngeal phase: Secondary | ICD-10-CM | POA: Diagnosis not present

## 2023-01-28 LAB — COMPREHENSIVE METABOLIC PANEL WITH GFR
ALT: 41 U/L (ref 0–44)
AST: 72 U/L — ABNORMAL HIGH (ref 15–41)
Albumin: 1.5 g/dL — ABNORMAL LOW (ref 3.5–5.0)
Alkaline Phosphatase: 62 U/L (ref 38–126)
Anion gap: 5 (ref 5–15)
BUN: 17 mg/dL (ref 8–23)
CO2: 22 mmol/L (ref 22–32)
Calcium: 7.6 mg/dL — ABNORMAL LOW (ref 8.9–10.3)
Chloride: 110 mmol/L (ref 98–111)
Creatinine, Ser: 1.02 mg/dL (ref 0.61–1.24)
GFR, Estimated: >60 mL/min
Glucose, Bld: 88 mg/dL (ref 70–99)
Potassium: 3.8 mmol/L (ref 3.5–5.1)
Sodium: 137 mmol/L (ref 135–145)
Total Bilirubin: 2.4 mg/dL — ABNORMAL HIGH (ref 0.3–1.2)
Total Protein: 6.6 g/dL (ref 6.5–8.1)

## 2023-01-28 LAB — CBC
HCT: 28.9 % — ABNORMAL LOW (ref 39.0–52.0)
Hemoglobin: 9.7 g/dL — ABNORMAL LOW (ref 13.0–17.0)
MCH: 32.9 pg (ref 26.0–34.0)
MCHC: 33.6 g/dL (ref 30.0–36.0)
MCV: 98 fL (ref 80.0–100.0)
Platelets: 70 K/uL — ABNORMAL LOW (ref 150–400)
RBC: 2.95 MIL/uL — ABNORMAL LOW (ref 4.22–5.81)
RDW: 17.5 % — ABNORMAL HIGH (ref 11.5–15.5)
WBC: 7.4 K/uL (ref 4.0–10.5)
nRBC: 0 % (ref 0.0–0.2)

## 2023-01-28 LAB — GLUCOSE, CAPILLARY
Glucose-Capillary: 81 mg/dL (ref 70–99)
Glucose-Capillary: 92 mg/dL (ref 70–99)
Glucose-Capillary: 98 mg/dL (ref 70–99)
Glucose-Capillary: 74 mg/dL (ref 70–99)

## 2023-01-28 NOTE — Progress Notes (Signed)
[facility] PROGRESS NOTE   Jacob Bass ZOX:[id number] DOB: 1960/10/13 DOA: 01/23/2023  PCP: Donia Ast., MD  Brief History/Interval Summary: 62 y.o. male with medical history significant of dementia, stroke with left-sided deficits, CKD 3B, PUD, hypertension, COPD, cirrhosis, anemia, thrombocytopenia presenting as a code stroke.  He was hospitalized for further management.  Consultants: Neurology  Procedures: None yet   Subjective/Interval History: Noted to be more awake and alert but according to nursing staff he is still not eating or drinking much.  Did not take his medicines overnight.  Tends to spit out his medications.    Assessment/Plan:  Acute hepatic encephalopathy/previous history of stroke/history of dementia Sent from nursing facility after staff noted left-sided facial droop.  Due to his dementia history was so difficult to obtain from the patient. Previous history of subarachnoid hemorrhage.  He has left-sided weakness from previous stroke. MRI brain did not show acute stroke.  CT angiogram head and neck was completed.  Results reviewed by neurology and they do not recommend any change in management.   LDL 133.  Statin has been added.  LFTs are noted to be abnormal but these can be monitored closely once statin has been initiated.  If there is a significant rise in LFTs after statin initiation then statin will need to be discontinued. Seen by speech therapy.  Currently on dysphagia 1 diet.  But not doing well according to nursing staff. HbA1c is 4.8. Echocardiogram shows LVEF of 55 to 60% with grade 1 diastolic dysfunction.  No significant valvular abnormalities. Ammonia level was noted to be significantly elevated at 137.  This likely contributed to his symptoms.  He was initially given lactulose enemas and then changed over to oral lactulose.  Improvement in ammonia levels noted.   Mentation is also improved though his oral intake remains  poor.  Hypomagnesemia/hypokalemia Improvement in potassium level noted.  History of liver cirrhosis/thrombocytopenia He does have ascites and peripheral edema.  He has had multiple paracentesis previously Has history of alcohol use and hepatitis C. LFTs were noted to be stable. Diuretics were resumed.  Does have ascites but not significant enough for him to undergo paracentesis. Platelet counts are low but stable for the most part.  Hypoglycemia Likely secondary to poor oral intake.  Given D50 with improvement.   History of COPD Chest x-ray without any significant findings.  Saturating normal on room air.  Normocytic anemia No evidence of overt bleeding.  Likely anemia of chronic disease.  Drop in hemoglobin noted but no evidence of overt bleeding.  Chronic kidney disease stage IIIb Renal function noted to be stable.  History of peptic ulcer disease Can resume PPI at discharge.  Essential hypertension Monitor blood pressures closely.  Back on diuretics.  Goals of care Patient appears to be declining over the past several weeks to months.  He has had several admissions in the last few months.  His oral intake has decreased significantly.  Palliative care was consulted.  He is a ward of the state so they attempted to reach DSS to address CODE STATUS.  Unfortunately nobody is available to address this still teams day.  However considering his poor overall prognosis and quality of life any heroic measures including resuscitation will be futile.  In view of this patient was changed over to DNR after he was seen by palliative care who also agreed with this assessment. Patient is also not a candidate for artificial nutrition due to all of the above issues as well  as his ascites and liver cirrhosis.  If his oral intake does not improve in the next 24 to 48 hours will consider transition to hospice.  May need to consider residential hospice versus return to SNF with hospice.    DVT  Prophylaxis: SCDs Code Status: DNR Family Communication: No family at bedside.  He is a ward of the state. Disposition Plan: Back to SNF when medically stable      Medications: Scheduled:  atorvastatin  40 mg Oral Daily   enoxaparin (LOVENOX) injection  40 mg Subcutaneous Daily   furosemide  60 mg Oral Daily   lactulose  20 g Oral TID   latanoprost  1 drop Both Eyes QHS   mometasone-formoterol  2 puff Inhalation BID   spironolactone  100 mg Oral Daily   tamsulosin  0.4 mg Oral Daily   Continuous:  sodium chloride     dextrose 5 % and 0.45 % NaCl 75 mL/hr at 01/28/23 0149   ZOX:WRUEAV chloride, acetaminophen **OR** acetaminophen (TYLENOL) oral liquid 160 mg/5 mL **OR** acetaminophen, albuterol, LORazepam  Antibiotics: Anti-infectives (From admission, onward)    None       Objective:  Vital Signs  Vitals:   01/28/23 0030 01/28/23 0300 01/28/23 0729 01/28/23 0852  BP: (!) 143/91 (!) 165/89 (!) 157/78   Pulse: 95 76 76   Resp:   19   Temp: 97.8 F (36.6 C)  97.6 F (36.4 C)   TempSrc: Axillary Axillary Oral   SpO2: 100% 100% 100% 100%    Intake/Output Summary (Last 24 hours) at 01/28/2023 0858 Last data filed at 01/28/2023 0448 Gross per 24 hour  Intake 249.48 ml  Output 550 ml  Net -300.52 ml    There were no vitals filed for this visit.  General appearance: Awake alert.  In no distress.  Distracted. Resp: Clear to auscultation bilaterally.  Normal effort Cardio: S1-S2 is normal regular.  No S3-S4.  No rubs murmurs or bruit GI: Abdomen is soft.  Mildly distended.  Ascites noted. Left-sided weakness from previous stroke.  Lab Results:  Data Reviewed: I have personally reviewed following labs and reports of the imaging studies  CBC: Recent Labs  Lab 01/23/23 1135 01/23/23 1141 01/25/23 0659 01/26/23 0330 01/27/23 0445 01/28/23 0431  WBC 5.3  --  6.1 6.3 8.9 7.4  NEUTROABS 2.9  --   --   --   --   --   HGB 11.2* 11.6* 10.1* 9.0* 8.2* 9.7*  HCT  32.6* 34.0* 29.4* 26.4* 24.5* 28.9*  MCV 94.5  --  96.1 95.3 96.8 98.0  PLT 63*  --  84* 89* 69* 70*     Basic Metabolic Panel: Recent Labs  Lab 01/23/23 1135 01/23/23 1141 01/23/23 2040 01/25/23 0659 01/26/23 0330 01/27/23 0445 01/28/23 0431  NA 136 141  --  137 137 135 137  K 4.1 4.2  --  3.9 3.4* 3.2* 3.8  CL 103 102  --  106 108 107 110  CO2 25  --   --  22 22 24 22   GLUCOSE 90 86  --  52* 110* 93 88  BUN 20 22  --  19 18 17 17   CREATININE 1.19 1.00  --  1.09 1.06 1.01 1.02  CALCIUM 8.2*  --   --  8.1* 7.8* 7.3* 7.6*  MG  --   --  1.6* 1.9 2.0  --   --      GFR: CrCl cannot be calculated (Unknown ideal weight.).  Liver Function Tests: Recent Labs  Lab 01/23/23 1135 01/25/23 0659 01/26/23 0330 01/27/23 0445 01/28/23 0431  AST 66* 81* 82* 68* 72*  ALT 38 47* 45* 37 41  ALKPHOS 75 67 68 52 62  BILITOT 2.0* 3.1* 2.2* 2.5* 2.4*  PROT 7.0 6.8 6.4* 5.9* 6.6  ALBUMIN 1.7* 1.7* 1.6* <1.5* 1.5*     Recent Labs  Lab 01/23/23 1230 01/25/23 0659 01/27/23 0445  AMMONIA 137* 65* 55*     Coagulation Profile: Recent Labs  Lab 01/23/23 2040  INR 1.4*      CBG: Recent Labs  Lab 01/27/23 0820 01/27/23 1157 01/27/23 1604 01/27/23 2111 01/28/23 0603  GLUCAP 80 89 91 92 81     Radiology Studies: DG CHEST PORT 1 VIEW  Result Date: 01/26/2023 CLINICAL DATA:  Dyspnea. EXAM: PORTABLE CHEST 1 VIEW COMPARISON:  Radiograph 01/23/2023 FINDINGS: Significant patient rotation. Additionally the patient has left arm obscures the left lung base due to contracture. Grossly stable heart size and mediastinal contours. Retrocardiac opacity likely representing pleural effusion, when compared with included portions from neck CTA 01/24/2023. This is not well assessed by radiograph. No definite right pleural effusion. Normal pulmonary vasculature. No pneumothorax. IMPRESSION: Rotated exam. Retrocardiac opacity likely representing pleural effusion, when compared with neck CTA  01/24/2023. This is not well assessed by radiograph due to rotation and patient's arm obscuring the left lung base. Electronically Signed   By: Narda Rutherford M.D.   On: 01/26/2023 15:14       LOS: 4 days   Jacob Bass  [facility] Pager on [email or web address]  01/28/2023, 8:58 AM

## 2023-01-29 DIAGNOSIS — F0390 Unspecified dementia without behavioral disturbance: Secondary | ICD-10-CM | POA: Diagnosis not present

## 2023-01-29 DIAGNOSIS — Z515 Encounter for palliative care: Secondary | ICD-10-CM | POA: Diagnosis not present

## 2023-01-29 DIAGNOSIS — Z7189 Other specified counseling: Secondary | ICD-10-CM | POA: Diagnosis not present

## 2023-01-29 DIAGNOSIS — K7682 Hepatic encephalopathy: Secondary | ICD-10-CM | POA: Diagnosis not present

## 2023-01-29 LAB — GLUCOSE, CAPILLARY
Glucose-Capillary: 97 mg/dL (ref 70–99)
Glucose-Capillary: 71 mg/dL (ref 70–99)
Glucose-Capillary: 89 mg/dL (ref 70–99)

## 2023-01-29 MED ORDER — ATORVASTATIN CALCIUM 40 MG PO TABS: 40.0000 mg | ORAL_TABLET | Freq: Every day | ORAL | 0 refills | Status: AC

## 2023-01-29 MED ORDER — LACTULOSE 10 GM/15ML PO SOLN: 20.0000 g | Freq: Three times a day (TID) | ORAL | 0 refills | Status: AC

## 2023-01-29 MED ORDER — ATORVASTATIN CALCIUM 40 MG PO TABS: 40.0000 mg | ORAL_TABLET | Freq: Every day | ORAL | Status: AC

## 2023-01-29 NOTE — Discharge Summary (Addendum)
[facility]  Physician Discharge Summary   Patient ID: Jacob Bass MRN: [id number] DOB/AGE: [birth year] 64 y.o.  Admit date: 01/23/2023 Discharge date:   01/29/2023   PCP: Donia Ast., MD  DISCHARGE DIAGNOSES:  Acute hepatic encephalopathy   Cirrhosis of liver without ascites (HCC)   Hypertension   History of CVA (cerebrovascular accident)   Anemia   PUD (peptic ulcer disease)   Stage 3b chronic kidney disease (CKD) (HCC)   RECOMMENDATIONS FOR OUTPATIENT FOLLOW UP: Palliative care to follow at skilled nursing facility.  Goal should be to minimize rehospitalization.  If patient declines he should be transitioned to hospice. Monitor LFTs every few weeks. Consider stopping statin if LFTs worsen. Check CBC and complete metabolic panel in 3 to 4 days.      Home Health: SNF Equipment/Devices: None  CODE STATUS: Please see discussion below.  DISCHARGE CONDITION: poor  Diet recommendation: Dysphagia 1 diet with thin liquids  INITIAL HISTORY:  62 y.o. male with medical history significant of dementia, stroke with left-sided deficits, CKD 3B, PUD, hypertension, COPD, cirrhosis, anemia, thrombocytopenia presenting as a code stroke.  He was hospitalized for further management.   Consultants: Neurology   Procedures: None   HOSPITAL COURSE:   Acute hepatic encephalopathy/previous history of stroke/history of dementia Sent from nursing facility after staff noted left-sided facial droop.  Due to his dementia history was so difficult to obtain from the patient. Previous history of subarachnoid hemorrhage.  He has left-sided weakness from previous stroke. MRI brain did not show acute stroke.  CT angiogram head and neck was completed.  Results reviewed by neurology and they do not recommend any change in management.   LDL 133.  Statin has been added.  LFTs are noted to be abnormal but these can be monitored closely once statin has been initiated.  If there is a  significant rise in LFTs after statin initiation then statin will need to be discontinued. Seen by speech therapy.  Started on dysphagia 1 diet.  His oral intake has improved in the last 24 hours. HbA1c is 4.8. Echocardiogram shows LVEF of 55 to 60% with grade 1 diastolic dysfunction.  No significant valvular abnormalities. Ammonia level was noted to be significantly elevated at 137.  This likely contributed to his symptoms.  He was initially given lactulose enemas and then changed over to oral lactulose.  Improvement in ammonia levels noted.   Patient's mentation has also also improved.   Hypomagnesemia/hypokalemia Improvement in potassium level noted.   History of liver cirrhosis/thrombocytopenia He does have ascites and peripheral edema.  He has had multiple paracentesis previously Has history of alcohol use and hepatitis C. LFTs were noted to be stable. Diuretics were resumed.  Does have ascites but not significant enough for him to undergo paracentesis. Platelet counts are low but stable for the most part.   Hypoglycemia Likely secondary to poor oral intake.  Given D50 with improvement.    History of COPD Chest x-ray without any significant findings.  Saturating normal on room air.   Normocytic anemia No evidence of overt bleeding.  Likely anemia of chronic disease.  Drop in hemoglobin noted but no evidence of overt bleeding.   Chronic kidney disease stage IIIb Renal function noted to be stable.   History of peptic ulcer disease Can resume PPI at discharge.   Essential hypertension Monitor blood pressures closely.  Back on diuretics.   Goals of care Patient appears to be declining over the past several weeks to months.  He has had several admissions in the last few months.  His oral intake has decreased significantly.  Palliative care was consulted.  He is a ward of the state so they attempted to reach DSS to address CODE STATUS.  Unfortunately nobody from DSS was available  to address this during the weekend.  However considering his poor overall prognosis and quality of life any heroic measures including resuscitation will be futile.  In view of this patient was changed over to DNR after he was seen by palliative care who also agreed with this assessment. Patient is also not a candidate for artificial nutrition due to all of the above issues as well as his ascites and liver cirrhosis. DSS reached out to hospital stating that they do not agree with change to DNR. After multiple discussions between palliative and DSS we are abiding with DSS request to rescind DNR at discharge.  We however continue to recommend a DNR/DNI status for this patient with eventual transition to hospice/comfort for reasons mentioned above.  Please note that patient can be DNR when [facility] is [facility] but it will need to be rescinded when he is discharged  Patient is stable.  Okay for discharge back to SNF today.   PERTINENT LABS:  The results of significant diagnostics from this hospitalization (including imaging, microbiology, ancillary and laboratory) are listed below for reference.     Labs:   Basic Metabolic Panel: Recent Labs  Lab 01/23/23 1135 01/23/23 1141 01/23/23 2040 01/25/23 0659 01/26/23 0330 01/27/23 0445 01/28/23 0431  NA 136 141  --  137 137 135 137  K 4.1 4.2  --  3.9 3.4* 3.2* 3.8  CL 103 102  --  106 108 107 110  CO2 25  --   --  22 22 24 22   GLUCOSE 90 86  --  52* 110* 93 88  BUN 20 22  --  19 18 17 17   CREATININE 1.19 1.00  --  1.09 1.06 1.01 1.02  CALCIUM 8.2*  --   --  8.1* 7.8* 7.3* 7.6*  MG  --   --  1.6* 1.9 2.0  --   --    Liver Function Tests: Recent Labs  Lab 01/23/23 1135 01/25/23 0659 01/26/23 0330 01/27/23 0445 01/28/23 0431  AST 66* 81* 82* 68* 72*  ALT 38 47* 45* 37 41  ALKPHOS 75 67 68 52 62  BILITOT 2.0* 3.1* 2.2* 2.5* 2.4*  PROT 7.0 6.8 6.4* 5.9* 6.6  ALBUMIN 1.7* 1.7* 1.6* <1.5* 1.5*    Recent Labs  Lab  01/23/23 1230 01/25/23 0659 01/27/23 0445  AMMONIA 137* 65* 55*   CBC: Recent Labs  Lab 01/23/23 1135 01/23/23 1141 01/25/23 0659 01/26/23 0330 01/27/23 0445 01/28/23 0431  WBC 5.3  --  6.1 6.3 8.9 7.4  NEUTROABS 2.9  --   --   --   --   --   HGB 11.2* 11.6* 10.1* 9.0* 8.2* 9.7*  HCT 32.6* 34.0* 29.4* 26.4* 24.5* 28.9*  MCV 94.5  --  96.1 95.3 96.8 98.0  PLT 63*  --  84* 89* 69* 70*    BNP: BNP (last 3 results) Recent Labs    09/10/22 1000 01/11/23 1747  BNP 165.2* 59.1    CBG: Recent Labs  Lab 01/28/23 0603 01/28/23 1133 01/28/23 1733 01/28/23 2144 01/29/23 0610  GLUCAP 81 92 98 74 71     IMAGING STUDIES DG CHEST PORT 1 VIEW  Result Date: 01/26/2023 CLINICAL DATA:  Dyspnea. EXAM: PORTABLE CHEST 1 VIEW COMPARISON:  Radiograph 01/23/2023 FINDINGS: Significant patient rotation. Additionally the patient has left arm obscures the left lung base due to contracture. Grossly stable heart size and mediastinal contours. Retrocardiac opacity likely representing pleural effusion, when compared with included portions from neck CTA 01/24/2023. This is not well assessed by radiograph. No definite right pleural effusion. Normal pulmonary vasculature. No pneumothorax. IMPRESSION: Rotated exam. Retrocardiac opacity likely representing pleural effusion, when compared with neck CTA 01/24/2023. This is not well assessed by radiograph due to rotation and patient's arm obscuring the left lung base. Electronically Signed   By: Narda Rutherford M.D.   On: 01/26/2023 15:14   CT ANGIO HEAD NECK W WO CM  Result Date: 01/24/2023 CLINICAL DATA:  Neuro deficit, acute, stroke suspected EXAM: CT ANGIOGRAPHY HEAD AND NECK WITH AND WITHOUT CONTRAST TECHNIQUE: Multidetector CT imaging of the head and neck was performed using the standard protocol during bolus administration of intravenous contrast. Multiplanar CT image reconstructions and MIPs were obtained to evaluate the vascular anatomy. Carotid  stenosis measurements (when applicable) are obtained utilizing NASCET criteria, using the distal internal carotid diameter as the denominator. RADIATION DOSE REDUCTION: This exam was performed according to the departmental dose-optimization program which includes automated exposure control, adjustment of the mA and/or kV according to patient size and/or use of iterative reconstruction technique. CONTRAST:  75mL OMNIPAQUE IOHEXOL 350 MG/ML SOLN COMPARISON:  Same day MRI head. FINDINGS: CT HEAD FINDINGS Motion limited.  Within this limitation: Brain: No evidence of acute large vascular territory infarct, acute hemorrhage, mass lesion midline shift. Extensive confluent and patchy white matter hypodensities, nonspecific but compatible with chronic microvascular ischemic disease. Multiple remote infarcts and cerebral atrophy, better characterized on same day MRI. Vascular: See below. Skull: No acute fracture. Sinuses/Orbits: The knee clear sinuses.  No acute orbital findings. Other: No mastoid effusions. Review of the MIP images confirms the above findings CTA NECK FINDINGS Aortic arch: Aortic atherosclerosis. Great vessel origins are patent. Right carotid system: Atherosclerosis at the carotid bifurcation with approximately 50% stenosis of the ICA origin. Left carotid system: No evidence of dissection, stenosis (50% or greater), or occlusion. Vertebral arteries: Significantly motion limited evaluation of vertebral artery origins with potentially hemodynamically significant stenosis bilaterally. Skeleton: No acute abnormality on limited assessment. Other neck: No acute abnormality on limited assessment. Upper chest: Large left pleural effusion. Review of the MIP images confirms the above findings CTA HEAD FINDINGS Anterior circulation: Bilateral intracranial ICAs are patent with severe stenosis bilaterally. Bilateral MCAs and ACAs are patent without proximal high-grade stenosis. 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 Posterior circulation: Bilateral intradural vertebral arteries, basilar artery and bilateral posterior cerebral arteries are patent without proximal high-grade stenosis. Venous sinuses: As permitted by contrast timing, patent. Anatomic variants: Detailed above. Review of the MIP images confirms the above findings IMPRESSION: 1. No emergent large vessel occlusion. 2. Severe bilateral intracranial ICA stenosis. 3. Approximately 50% stenosis of the right ICA origin in the neck. 4. Significantly motion limited evaluation of the vertebral artery origins with potentially hemodynamically significant stenosis bilaterally. A repeat CTA could confirm/better evaluate if clinically warranted. 5. Large left pleural effusion. 6. Aortic Atherosclerosis (ICD10-I70.0). Electronically Signed   By: Feliberto Harts M.D.   On: 01/24/2023 17:28   MR BRAIN WO CONTRAST  Result Date: 01/24/2023 CLINICAL DATA:  Neuro deficit, acute, stroke suspected EXAM: MRI HEAD WITHOUT CONTRAST TECHNIQUE: Multiplanar, multiecho pulse sequences of the brain and surrounding structures were obtained without intravenous contrast. COMPARISON:  MRI Brain 09/10/22 FINDINGS: Brain: Negative for an acute infarct. No hydrocephalus. No extra-axial fluid collection. Unchanged size and shape of the ventricular system. There is sequela of severe chronic microvascular ischemic change with extensive periventricular and subcortical white matter signal abnormality. There are chronic infarcts in the bilateral centrum semiovale and basal ganglia. There is also a chronic infarct in the right hemi pons. Generalized volume loss. Hemorrhage. Vascular: Normal flow voids. Skull and upper cervical spine: Normal marrow signal. Sinuses/Orbits: No middle ear or mastoid effusion. Mild mucosal thickening in the bilateral ethmoid sinuses. Orbits are unremarkable. Other: None. IMPRESSION: 1.  No acute intracranial process. 2. Redemonstrated sequela of severe  chronic microvascular ischemic change with multiple chronic infarcts and generalized volume loss. Electronically Signed   By: Lorenza Cambridge M.D.   On: 01/24/2023 14:34   ECHOCARDIOGRAM COMPLETE  Result Date: 01/24/2023    ECHOCARDIOGRAM REPORT   Patient Name:   [facility] Date of Exam: 01/24/2023 Medical Rec #:  [id number]    Height:       63.0 in Accession #:    [id number]   Weight:       174.2 lb Date of Birth:  06-27-1961     BSA:          1.823 m Patient Age:    62 years     BP:           136/78 mmHg Patient Gender: M            HR:           95 bpm. Exam Location:  Inpatient Procedure: 2D Echo, Cardiac Doppler and Color Doppler Indications:    Stroke  History:        Patient has prior history of Echocardiogram examinations, most                 recent 09/11/2022. Stroke; Risk Factors:Hypertension.  Sonographer:    Darlys Gales Referring Phys: [id number] DEVON SHAFER  Sonographer Comments: Technically difficult due hemiparesis. IMPRESSIONS  1. Left ventricular ejection fraction, by estimation, is 55 to 60%. The left ventricle has normal function. Left ventricular endocardial border not optimally defined to evaluate regional wall motion. Left ventricular diastolic parameters are consistent with Grade I diastolic dysfunction (impaired relaxation).  2. Right ventricular systolic function is normal. The right ventricular size is normal. Tricuspid regurgitation signal is inadequate for assessing PA pressure.  3. Left atrial size was mildly dilated.  4. The mitral valve was not well visualized. Trivial mitral valve regurgitation. No evidence of mitral stenosis.  5. The aortic valve is tricuspid. Aortic valve regurgitation is not visualized. No aortic stenosis is present.  6. IVC not visualized.  7. Technically difficult study, very poor images. FINDINGS  Left Ventricle: Left ventricular ejection fraction, by estimation, is 55 to 60%. The left ventricle has normal function. Left ventricular endocardial border not  optimally defined to evaluate regional wall motion. The left ventricular internal cavity size was normal in size. There is no left ventricular hypertrophy. Left ventricular diastolic parameters are consistent with Grade I diastolic dysfunction (impaired relaxation). Right Ventricle: The right ventricular size is normal. No increase in right ventricular wall thickness. Right ventricular systolic function is normal. Tricuspid regurgitation signal is inadequate for assessing PA pressure. Left Atrium: Left atrial size was mildly dilated. Right Atrium: Right atrial size was normal in size. Pericardium: There is no evidence of pericardial effusion. Mitral Valve: The mitral valve was not well visualized. Trivial mitral valve regurgitation. No  evidence of mitral valve stenosis. Tricuspid Valve: The tricuspid valve is normal in structure. Tricuspid valve regurgitation is trivial. Aortic Valve: The aortic valve is tricuspid. Aortic valve regurgitation is not visualized. No aortic stenosis is present. Pulmonic Valve: The pulmonic valve was normal in structure. Pulmonic valve regurgitation is not visualized. Aorta: The aortic root is normal in size and structure. Venous: The inferior vena cava was not well visualized. IAS/Shunts: The interatrial septum was not well visualized.  LEFT VENTRICLE PLAX 2D LVIDd:         4.30 cm   Diastology LVIDs:         3.10 cm   LV e' medial:    8.27 cm/s LV PW:         0.70 cm   LV E/e' medial:  6.8 LV IVS:        0.80 cm   LV e' lateral:   9.79 cm/s LVOT diam:     1.90 cm   LV E/e' lateral: 5.8 LVOT Area:     2.84 cm  RIGHT VENTRICLE RV S prime:     28.10 cm/s TAPSE (M-mode): 2.2 cm LEFT ATRIUM           Index LA Vol (A4C): 66.6 ml 36.53 ml/m  MITRAL VALVE MV Area (PHT): 2.93 cm    SHUNTS MV Decel Time: 259 msec    Systemic Diam: 1.90 cm MV E velocity: 56.60 cm/s MV A velocity: 58.70 cm/s MV E/A ratio:  0.96 Dalton McleanMD Electronically signed by Wilfred Lacy Signature Date/Time:  01/24/2023/10:05:50 AM    Final    DG Abdomen 1 View  Result Date: 01/23/2023 CLINICAL DATA:  Evaluate for foreign bodies prior to MRI. EXAM: ABDOMEN - 1 VIEW COMPARISON:  September 14, 2022. FINDINGS: The bowel gas pattern is normal. No radio-opaque calculi or other significant radiographic abnormality are seen. IMPRESSION: No radiopaque foreign body is noted.  No abnormal bowel dilatation. Aortic Atherosclerosis (ICD10-I70.0). Electronically Signed   By: Lupita Raider M.D.   On: 01/23/2023 14:28   DG Chest Portable 1 View  Result Date: 01/23/2023 CLINICAL DATA:  Altered mental status EXAM: PORTABLE CHEST 1 VIEW COMPARISON:  01/11/2023 FINDINGS: Heart size is normal. Aortic atherosclerosis as seen previously. Right lung is clear. Question mild atelectasis or infiltrate at the left lung base. Patient's left arm overlies that region to some degree. IMPRESSION: Question mild atelectasis or infiltrate at the left lung base. Electronically Signed   By: Paulina Fusi M.D.   On: 01/23/2023 12:41   CT HEAD CODE STROKE WO CONTRAST  Result Date: 01/23/2023 CLINICAL DATA:  Code stroke.  Left-sided facial droop EXAM: CT HEAD WITHOUT CONTRAST TECHNIQUE: Contiguous axial images were obtained from the base of the skull through the vertex without intravenous contrast. RADIATION DOSE REDUCTION: This exam was performed according to the departmental dose-optimization program which includes automated exposure control, adjustment of the mA and/or kV according to patient size and/or use of iterative reconstruction technique. COMPARISON:  MRI Brain 09/10/22 FINDINGS: Brain: No evidence of acute infarction, hemorrhage, hydrocephalus, extra-axial collection or mass lesion/mass effect. There is sequela of severe chronic microvascular ischemic change with a extensive periventricular hypodensity, as seen on prior brain MRI dated 09/10/2022. There are chronic infarcts in the right corona radiata and centrum semiovale. Vascular: No  hyperdense vessel or unexpected calcification. Skull: Normal. Negative for fracture or focal lesion. Sinuses/Orbits: No middle ear or mastoid effusion. Paranasal sinuses are clear. Orbits are unremarkable. Other: None. ASPECTS [facility] Stroke Program  Early CT Score): 10 when accounting for chronic findings. IMPRESSION: 1. No hemorrhage or CT evidence of an acute cortical infarct. 2. Sequela of severe chronic microvascular ischemic change with chronic infarcts in the right corona radiata and centrum semiovale. Findings were discussed with Dr. Selina Cooley on 01/23/23 at 12:13 PM. Electronically Signed   By: Lorenza Cambridge M.D.   On: 01/23/2023 12:16   US Paracentesis  Result Date: 01/12/2023 INDICATION: Recurrent ascites EXAM: ULTRASOUND GUIDED RLQ PARACENTESIS MEDICATIONS: 10 cc 1% lidocaine COMPLICATIONS: None immediate. PROCEDURE: Informed written consent was obtained from the patient after a discussion of the risks, benefits and alternatives to treatment. A timeout was performed prior to the initiation of the procedure. Initial ultrasound scanning demonstrates a large amount of ascites within the right lower abdominal quadrant. The right lower abdomen was prepped and draped in the usual sterile fashion. 1% lidocaine was used for local anesthesia. Following this, a 19 gauge, 7-cm, Yueh catheter was introduced. An ultrasound image was saved for documentation purposes. The paracentesis was performed. The catheter was removed and a dressing was applied. The patient tolerated the procedure well without immediate post procedural complication. Patient received post-procedure intravenous albumin; see nursing notes for details. FINDINGS: A total of approximately 4 liters of yellow fluid was removed. Samples were sent to the laboratory as requested by the clinical team. IMPRESSION: Successful ultrasound-guided paracentesis yielding 4 liters of peritoneal fluid. Performed by: [facility] Electronically Signed   By: Malachy Moan M.D.   On: 01/12/2023 14:42   DG Chest Port 1 View  Result Date: 01/11/2023 CLINICAL DATA:  Shortness of breath EXAM: PORTABLE CHEST 1 VIEW COMPARISON:  10/28/2018 FINDINGS: Cardiac shadow is stable. The lungs are hypoinflated but clear. Previously seen left basilar changes have resolved in the interval. No new focal abnormality is seen. No bony changes are noted. IMPRESSION: No active disease. Electronically Signed   By: Alcide Clever M.D.   On: 01/11/2023 17:51    DISCHARGE EXAMINATION: Vitals:   01/28/23 2348 01/29/23 0341 01/29/23 0734 01/29/23 0811  BP: (!) 183/89 (!) 166/81  (!) 160/88  Pulse: 99 90  83  Resp: 17 18  18   Temp: 97.9 F (36.6 C) 98.3 F (36.8 C)  (!) 97.3 F (36.3 C)  TempSrc: Oral Oral  Oral  SpO2: 99% 100% 99% 100%   General appearance: Awake alert.  In no distress Resp: Clear to auscultation bilaterally.  Normal effort Cardio: S1-S2 is normal regular.  No S3-S4.  No rubs murmurs or bruit GI: Abdomen is soft.  Mildly distended with ascites.  Nontender.    DISPOSITION: SNF  Discharge Instructions     Call MD for:  difficulty breathing, headache or visual disturbances   Complete by: As directed    Call MD for:  extreme fatigue   Complete by: As directed    Call MD for:  persistant dizziness or light-headedness   Complete by: As directed    Call MD for:  persistant nausea and vomiting   Complete by: As directed    Call MD for:  severe uncontrolled pain   Complete by: As directed    Call MD for:  temperature >100.4   Complete by: As directed    Diet - low sodium heart healthy   Complete by: As directed    Discharge instructions   Complete by: As directed    Please review instructions on the discharge summary.  You were cared for by a hospitalist during your hospital  stay. If you have any questions about your discharge medications or the care you received while you were in the hospital after you are discharged, you can call the unit and  asked to speak with the hospitalist on call if the hospitalist that took care of you is not available. Once you are discharged, your primary care physician will handle any further medical issues. Please note that NO REFILLS for any discharge medications will be authorized once you are discharged, as it is imperative that you return to your primary care physician (or establish a relationship with a primary care physician if you do not have one) for your aftercare needs so that they can reassess your need for medications and monitor your lab values. If you do not have a primary care physician, you can call [phone] for a physician referral.   Increase activity slowly   Complete by: As directed           Allergies as of 01/29/2023       Reactions   Penicillins Other (See Comments)   Unknown reaction Listed on MAR        Medication List     STOP taking these medications    senna-docusate 8.6-50 MG tablet Commonly known as: Senokot-S       TAKE these medications    albuterol 108 (90 Base) MCG/ACT inhaler Commonly known as: VENTOLIN HFA Inhale 2 puffs into the lungs every 4 (four) hours as needed for wheezing or shortness of breath.   atorvastatin 40 MG tablet Commonly known as: LIPITOR Take 1 tablet (40 mg total) by mouth daily.   atorvastatin 40 MG tablet Commonly known as: LIPITOR Take 1 tablet (40 mg total) by mouth daily.   ciprofloxacin 500 MG tablet Commonly known as: Cipro Take 1 tablet (500 mg total) by mouth daily at 8 pm. What changed:  when to take this additional instructions   cyanocobalamin 1000 MCG tablet Commonly known as: VITAMIN B12 Take 1,000 mcg by mouth daily.   Nutritional Drink Liqd Take 1 each by mouth 3 (three) times daily. House supplement   feeding supplement Liqd Take 237 mLs by mouth daily at 3 pm.   fluticasone-salmeterol 250-50 MCG/ACT Aepb Commonly known as: ADVAIR Inhale 1 puff into the lungs in the morning and at bedtime.    folic acid 1 MG tablet Commonly known as: FOLVITE Take 1 tablet (1 mg total) by mouth daily.   furosemide 20 MG tablet Commonly known as: LASIX Take 3 tablets (60 mg total) by mouth daily.   lactulose 10 GM/15ML solution Commonly known as: CHRONULAC Take 30 mLs (20 g total) by mouth 3 (three) times daily. What changed:  how much to take when to take this   latanoprost 0.005 % ophthalmic solution Commonly known as: XALATAN Place 1 drop into both eyes at bedtime. (0900) What changed: additional instructions   magnesium oxide 400 (240 Mg) MG tablet Commonly known as: MAG-OX Take 1 tablet (400 mg total) by mouth 3 (three) times daily.   omeprazole 20 MG tablet Commonly known as: PRILOSEC OTC Take 40 mg by mouth 2 (two) times daily.   spironolactone 100 MG tablet Commonly known as: ALDACTONE Take 1 tablet (100 mg total) by mouth daily.   tamsulosin 0.4 MG Caps capsule Commonly known as: FLOMAX Take 1 capsule (0.4 mg total) by mouth in the morning. (0900) What changed:  when to take this additional instructions   tiotropium 18 MCG inhalation capsule Commonly known as: SPIRIVA Place  18 mcg into inhaler and inhale daily.   Zinc Oxide 10 % Oint Apply 1 Application topically every 6 (six) hours as needed (irritation).           TOTAL DISCHARGE TIME: 35 minutes  Shoichi Mielke [employer] on [email or web address]  01/29/2023, 8:43 AM

## 2023-01-29 NOTE — Progress Notes (Signed)
Palliative:  HPI: 62 y.o. male  with past medical history of dementia, stroke with left-sided deficits, CKD (stage IIIb), PUD, HTN, COPD, cirrhosis, anemia and thrombocytopenia admitted on 01/23/2023 with code stroke.  Patient presented with left facial droop. MRI of brain did not reveal acute stroke.  CT angiogram of head and neck revealed no emergent large vessel occlusion. As per neuro, plan is to continue aspirin for stroke prevention and no further stroke workup is necessary.   I met again today with Jacob Bass. He does respond with simple verbal responses to my greeting. He is unable to have conversation or communicate his needs. He has breakfast tray at bedside and appears interested in eating. I gave him a few bites and a sip of drink and he seemed to swallow and tolerate without any overt signs of aspiration or pocketing at this time.   I was able to connect with DSS legal guardian Jacob Bass. Jacob Bass health status and poor prognosis. I requested DNR paperwork to complete for them. Jacob Bass tells me that Jacob Bass is to remain full code status and they are unable to consider DNR status. She explains that they cannot legally make him DNR status and he has another court hearing planned. I expressed my confusion regarding Jacob Bass guardianship and ultimate decision maker. I do not understand why they are not able to consider putting in place an appropriate care plan and measures for Jacob Bass. I was also told by DSS leader Jacob Bass on 01/26/23 that they could consider DNR for Jacob Bass. Jacob Bass will put me in contact with guardian ad litem to discuss further.   I discussed with guardian ad litem, Jacob Bass. Jacob Bass further explains that Jacob Bass has not been declared incompetent by the court and there is a specific legal limitation from the court that they cannot legally change to DNR status. Jacob Bass further explains that Jacob Bass has previously requested full code status when he  was last had capacity to make his own decisions. I voiced my concern with Jacob Bass that I worry about the details of these conversations with Jacob Bass. Patients often express desire for full code status in my conversations but often change their mind when we discuss the details of what this means, consequences of intervention, and expectations/poor outcome. I respect following patient's wishes but it is also standard care to continue these conversations as health status changes and we are doing Jacob Bass a disservice by not considering more appropriate goals of care in light of declining health status as we would provide for any of our patients. I expressed that the medical team strongly recommends DNR status as resuscitative measures would be futile and cruel for Jacob Bass as he approaches the end of his life. The medical team does not feel resuscitation would be an appropriate medical intervention that should be offered to Jacob Bass. I further expressed that our recommendation would also be for hospice support. Jacob Bass verbalized her understanding of the situation and states that legally they are unable to consider DNR at this time. Next court hearing is planned for June 18th.   All questions/concerns addressed to the best of my ability. Discussed with Dr. Rito Bass, [facility], as well as Jacob Bass.   Exam: More awake and alert. Simple one word response at times. Appropriate greeting returned to me. Overall confused. Not following commands. Breathing regular, unlabored. Abd tight with slight ascites. BLE edematous. Warm to touch.   Plan: -  Recommend DNR status and hospice care. - See my note from 01/26/23 as well.   60 min  Jacob Channel, NP Palliative Medicine Team Pager [phone] (Please see [email or web address] for schedule) Team Phone [phone]    Greater than 50%  of this time was spent counseling and coordinating care related to the above assessment and plan

## 2023-01-29 NOTE — TOC Transition Note (Signed)
Transition of Care [facility]) - CM/SW Discharge Note   Patient Details  Name: Jacob Bass MRN: [id number] Date of Birth: Jul 06, 1961  [facility] CM/SW Contact:  Kermit Balo, RN Phone Number: 01/29/2023, 9:59 AM   Clinical Narrative:    Pt is discharging to [facility] today. CM spoke with Jacob Bass, DSS guardian and she is aware and in agreement. She states the pt will need to be changed  back to a full code as DSS has not determined him a DNR and they have to be the ones to make this decision. CM has updated the MD.  Pt will transport via [facility].   Number for report: [phone]   Final next level of care: Skilled Nursing Facility Barriers to Discharge: No Barriers Identified   Patient Goals and CMS Choice CMS Medicare.gov Compare Post Acute Care list provided to:: Patient Represenative (must comment) Choice offered to / list presented to : [facility] POA / Guardian  Discharge Placement                Patient chooses bed at:  [facility]) Patient to be transferred to facility by: [facility] Name of family member notified: Jacob Bass--legal guardian with DSS Patient and family notified of of transfer: 01/29/23  Discharge Plan and Services Additional resources added to the After Visit Summary for       Post Acute Care Choice: Skilled Nursing Facility                               Social Determinants of Health (SDOH) Interventions SDOH Screenings   Food Insecurity: No Food Insecurity (01/11/2023)  Transportation Needs: Unmet Transportation Needs (01/11/2023)  Utilities: Patient Declined (10/29/2022)  Tobacco Use: Medium Risk (01/23/2023)     Readmission Risk Interventions    10/30/2022    9:16 AM  Readmission Risk Prevention Plan  Transportation Screening Complete  Medication Review (RN Care Manager) Complete  PCP or Specialist appointment within 3-5 days of discharge Complete  HRI or Home Care Consult Complete  SW Recovery Care/Counseling Consult  Complete  Palliative Care Screening Not Applicable  Skilled Nursing Facility Complete

## 2023-03-25 ENCOUNTER — Ambulatory Visit: Payer: Medicaid Other | Admitting: Gastroenterology

## 2023-12-06 NOTE — Progress Notes (Signed)
 I agree with the above plan
